# Patient Record
Sex: Female | Born: 1935 | Race: White | Hispanic: No | Marital: Married | Smoking: Never smoker
Health system: Southern US, Community
[De-identification: ages and names within clinical notes are randomized; demographics above are authoritative.]

## PROBLEM LIST (undated history)

## (undated) DIAGNOSIS — N83201 Unspecified ovarian cyst, right side: Secondary | ICD-10-CM

## (undated) DIAGNOSIS — N3946 Mixed incontinence: Secondary | ICD-10-CM

## (undated) DIAGNOSIS — D649 Anemia, unspecified: Secondary | ICD-10-CM

## (undated) DIAGNOSIS — I214 Non-ST elevation (NSTEMI) myocardial infarction: Secondary | ICD-10-CM

## (undated) DIAGNOSIS — N3281 Overactive bladder: Secondary | ICD-10-CM

## (undated) DIAGNOSIS — M8580 Other specified disorders of bone density and structure, unspecified site: Secondary | ICD-10-CM

## (undated) DIAGNOSIS — I10 Essential (primary) hypertension: Secondary | ICD-10-CM

## (undated) DIAGNOSIS — E785 Hyperlipidemia, unspecified: Secondary | ICD-10-CM

## (undated) DIAGNOSIS — F419 Anxiety disorder, unspecified: Secondary | ICD-10-CM

## (undated) DIAGNOSIS — C9000 Multiple myeloma not having achieved remission: Secondary | ICD-10-CM

## (undated) DIAGNOSIS — R17 Unspecified jaundice: Secondary | ICD-10-CM

## (undated) DIAGNOSIS — M1990 Unspecified osteoarthritis, unspecified site: Secondary | ICD-10-CM

## (undated) DIAGNOSIS — G2581 Restless legs syndrome: Secondary | ICD-10-CM

## (undated) DIAGNOSIS — E041 Nontoxic single thyroid nodule: Secondary | ICD-10-CM

## (undated) DIAGNOSIS — M5410 Radiculopathy, site unspecified: Secondary | ICD-10-CM

## (undated) DIAGNOSIS — R5382 Chronic fatigue, unspecified: Secondary | ICD-10-CM

## (undated) DIAGNOSIS — R2681 Unsteadiness on feet: Secondary | ICD-10-CM

## (undated) DIAGNOSIS — R918 Other nonspecific abnormal finding of lung field: Secondary | ICD-10-CM

## (undated) DIAGNOSIS — K589 Irritable bowel syndrome without diarrhea: Secondary | ICD-10-CM

## (undated) DIAGNOSIS — K219 Gastro-esophageal reflux disease without esophagitis: Secondary | ICD-10-CM

## (undated) DIAGNOSIS — N83202 Unspecified ovarian cyst, left side: Secondary | ICD-10-CM

## (undated) DIAGNOSIS — F329 Major depressive disorder, single episode, unspecified: Secondary | ICD-10-CM

## (undated) DIAGNOSIS — I7300 Raynaud's syndrome without gangrene: Secondary | ICD-10-CM

## (undated) DIAGNOSIS — I2510 Atherosclerotic heart disease of native coronary artery without angina pectoris: Secondary | ICD-10-CM

## (undated) DIAGNOSIS — Z951 Presence of aortocoronary bypass graft: Secondary | ICD-10-CM

## (undated) DIAGNOSIS — F0390 Unspecified dementia without behavioral disturbance: Secondary | ICD-10-CM

## (undated) DIAGNOSIS — M48061 Spinal stenosis, lumbar region without neurogenic claudication: Secondary | ICD-10-CM

## (undated) DIAGNOSIS — M940 Chondrocostal junction syndrome [Tietze]: Secondary | ICD-10-CM

## (undated) DIAGNOSIS — F410 Panic disorder [episodic paroxysmal anxiety] without agoraphobia: Secondary | ICD-10-CM

## (undated) DIAGNOSIS — R32 Unspecified urinary incontinence: Secondary | ICD-10-CM

## (undated) HISTORY — DX: Restless legs syndrome: G25.81

## (undated) HISTORY — DX: Atherosclerotic heart disease of native coronary artery without angina pectoris: I25.10

## (undated) HISTORY — DX: Hyperlipidemia, unspecified: E78.5

## (undated) HISTORY — DX: Chronic fatigue, unspecified: R53.82

## (undated) HISTORY — PX: CATARACT EXTRACTION, BILATERAL: SHX1313

## (undated) HISTORY — DX: Multiple myeloma not having achieved remission: C90.00

## (undated) HISTORY — DX: Spinal stenosis, lumbar region without neurogenic claudication: M48.061

## (undated) HISTORY — PX: DILATION AND CURETTAGE OF UTERUS: SHX78

## (undated) HISTORY — DX: Chondrocostal junction syndrome (tietze): M94.0

## (undated) HISTORY — DX: Unspecified dementia, unspecified severity, without behavioral disturbance, psychotic disturbance, mood disturbance, and anxiety: F03.90

## (undated) HISTORY — DX: Other nonspecific abnormal finding of lung field: R91.8

## (undated) HISTORY — PX: COLONOSCOPY: SHX174

## (undated) HISTORY — DX: Nontoxic single thyroid nodule: E04.1

## (undated) HISTORY — DX: Unsteadiness on feet: R26.81

## (undated) HISTORY — DX: Panic disorder (episodic paroxysmal anxiety): F41.0

## (undated) HISTORY — DX: Unspecified ovarian cyst, left side: N83.202

## (undated) HISTORY — DX: Raynaud's syndrome without gangrene: I73.00

## (undated) HISTORY — DX: Irritable bowel syndrome, unspecified: K58.9

## (undated) HISTORY — DX: Major depressive disorder, single episode, unspecified: F32.9

## (undated) HISTORY — DX: Radiculopathy, site unspecified: M54.10

## (undated) HISTORY — DX: Other specified disorders of bone density and structure, unspecified site: M85.80

## (undated) HISTORY — DX: Mixed incontinence: N39.46

## (undated) HISTORY — DX: Anemia, unspecified: D64.9

## (undated) HISTORY — PX: UPPER GASTROINTESTINAL ENDOSCOPY: SHX188

## (undated) HISTORY — DX: Unspecified ovarian cyst, right side: N83.201

## (undated) HISTORY — PX: TONSILLECTOMY AND ADENOIDECTOMY: SUR1326

## (undated) HISTORY — DX: Unspecified jaundice: R17

---

## 1998-05-16 ENCOUNTER — Other Ambulatory Visit: Admission: RE | Admit: 1998-05-16 | Discharge: 1998-05-16 | Payer: Self-pay | Admitting: Gastroenterology

## 1998-05-29 ENCOUNTER — Ambulatory Visit (HOSPITAL_COMMUNITY): Admission: RE | Admit: 1998-05-29 | Discharge: 1998-05-29 | Payer: Self-pay | Admitting: Gastroenterology

## 1999-04-29 ENCOUNTER — Other Ambulatory Visit: Admission: RE | Admit: 1999-04-29 | Discharge: 1999-04-29 | Payer: Self-pay | Admitting: Obstetrics and Gynecology

## 2000-03-16 ENCOUNTER — Encounter: Payer: Self-pay | Admitting: Obstetrics and Gynecology

## 2000-03-16 ENCOUNTER — Encounter: Admission: RE | Admit: 2000-03-16 | Discharge: 2000-03-16 | Payer: Self-pay | Admitting: Obstetrics and Gynecology

## 2000-04-20 ENCOUNTER — Other Ambulatory Visit: Admission: RE | Admit: 2000-04-20 | Discharge: 2000-04-20 | Payer: Self-pay | Admitting: Geriatric Medicine

## 2001-02-12 ENCOUNTER — Encounter (INDEPENDENT_AMBULATORY_CARE_PROVIDER_SITE_OTHER): Payer: Self-pay | Admitting: Specialist

## 2001-02-12 ENCOUNTER — Encounter: Payer: Self-pay | Admitting: Physician Assistant

## 2001-02-12 ENCOUNTER — Ambulatory Visit (HOSPITAL_COMMUNITY): Admission: RE | Admit: 2001-02-12 | Discharge: 2001-02-12 | Payer: Self-pay | Admitting: Gastroenterology

## 2001-02-14 ENCOUNTER — Encounter: Payer: Self-pay | Admitting: Physician Assistant

## 2001-04-28 ENCOUNTER — Other Ambulatory Visit: Admission: RE | Admit: 2001-04-28 | Discharge: 2001-04-28 | Payer: Self-pay | Admitting: Geriatric Medicine

## 2001-08-24 ENCOUNTER — Inpatient Hospital Stay (HOSPITAL_COMMUNITY): Admission: EM | Admit: 2001-08-24 | Discharge: 2001-08-26 | Payer: Self-pay | Admitting: Emergency Medicine

## 2001-08-24 ENCOUNTER — Encounter: Payer: Self-pay | Admitting: Emergency Medicine

## 2001-08-26 ENCOUNTER — Encounter: Payer: Self-pay | Admitting: Geriatric Medicine

## 2002-06-27 ENCOUNTER — Ambulatory Visit (HOSPITAL_COMMUNITY): Admission: RE | Admit: 2002-06-27 | Discharge: 2002-06-27 | Payer: Self-pay | Admitting: Specialist

## 2002-12-23 ENCOUNTER — Encounter: Payer: Self-pay | Admitting: Geriatric Medicine

## 2002-12-23 ENCOUNTER — Encounter: Admission: RE | Admit: 2002-12-23 | Discharge: 2002-12-23 | Payer: Self-pay | Admitting: Geriatric Medicine

## 2003-06-16 ENCOUNTER — Other Ambulatory Visit: Admission: RE | Admit: 2003-06-16 | Discharge: 2003-06-16 | Payer: Self-pay | Admitting: Geriatric Medicine

## 2004-12-04 ENCOUNTER — Encounter: Payer: Self-pay | Admitting: Physician Assistant

## 2005-01-27 ENCOUNTER — Other Ambulatory Visit: Admission: RE | Admit: 2005-01-27 | Discharge: 2005-01-27 | Payer: Self-pay | Admitting: Gynecology

## 2005-02-03 ENCOUNTER — Encounter: Payer: Self-pay | Admitting: Physician Assistant

## 2005-06-09 HISTORY — PX: CORONARY ARTERY BYPASS GRAFT: SHX141

## 2006-02-05 ENCOUNTER — Encounter (HOSPITAL_COMMUNITY): Admission: RE | Admit: 2006-02-05 | Discharge: 2006-05-06 | Payer: Self-pay | Admitting: Cardiovascular Disease

## 2006-02-08 ENCOUNTER — Observation Stay (HOSPITAL_COMMUNITY): Admission: EM | Admit: 2006-02-08 | Discharge: 2006-02-09 | Payer: Self-pay | Admitting: Emergency Medicine

## 2006-05-07 ENCOUNTER — Encounter (HOSPITAL_COMMUNITY): Admission: RE | Admit: 2006-05-07 | Discharge: 2006-06-18 | Payer: Self-pay | Admitting: Cardiovascular Disease

## 2006-05-13 ENCOUNTER — Other Ambulatory Visit: Admission: RE | Admit: 2006-05-13 | Discharge: 2006-05-13 | Payer: Self-pay | Admitting: Geriatric Medicine

## 2006-05-21 ENCOUNTER — Inpatient Hospital Stay (HOSPITAL_COMMUNITY): Admission: AD | Admit: 2006-05-21 | Discharge: 2006-06-07 | Payer: Self-pay | Admitting: Cardiovascular Disease

## 2006-05-26 ENCOUNTER — Encounter: Payer: Self-pay | Admitting: Vascular Surgery

## 2006-05-27 ENCOUNTER — Encounter: Payer: Self-pay | Admitting: Vascular Surgery

## 2006-06-13 ENCOUNTER — Emergency Department (HOSPITAL_COMMUNITY): Admission: EM | Admit: 2006-06-13 | Discharge: 2006-06-13 | Payer: Self-pay | Admitting: Emergency Medicine

## 2006-06-14 ENCOUNTER — Inpatient Hospital Stay (HOSPITAL_COMMUNITY): Admission: EM | Admit: 2006-06-14 | Discharge: 2006-06-16 | Payer: Self-pay | Admitting: Emergency Medicine

## 2006-06-25 ENCOUNTER — Encounter (HOSPITAL_COMMUNITY): Admission: RE | Admit: 2006-06-25 | Discharge: 2006-09-23 | Payer: Self-pay | Admitting: Cardiovascular Disease

## 2006-07-13 ENCOUNTER — Encounter: Admission: RE | Admit: 2006-07-13 | Discharge: 2006-07-13 | Payer: Self-pay | Admitting: Cardiothoracic Surgery

## 2006-07-30 ENCOUNTER — Ambulatory Visit: Payer: Self-pay | Admitting: Cardiothoracic Surgery

## 2006-08-26 ENCOUNTER — Emergency Department (HOSPITAL_COMMUNITY): Admission: EM | Admit: 2006-08-26 | Discharge: 2006-08-26 | Payer: Self-pay | Admitting: Emergency Medicine

## 2006-12-09 ENCOUNTER — Encounter: Admission: RE | Admit: 2006-12-09 | Discharge: 2006-12-09 | Payer: Self-pay | Admitting: Geriatric Medicine

## 2006-12-15 ENCOUNTER — Ambulatory Visit: Payer: Self-pay | Admitting: Oncology

## 2007-01-08 LAB — CBC WITH DIFFERENTIAL/PLATELET
Basophils Absolute: 0 10*3/uL (ref 0.0–0.1)
Eosinophils Absolute: 0.2 10*3/uL (ref 0.0–0.5)
HCT: 31.3 % — ABNORMAL LOW (ref 34.8–46.6)
HGB: 10.9 g/dL — ABNORMAL LOW (ref 11.6–15.9)
MCH: 32.6 pg (ref 26.0–34.0)
NEUT#: 1.4 10*3/uL — ABNORMAL LOW (ref 1.5–6.5)
NEUT%: 39 % — ABNORMAL LOW (ref 39.6–76.8)
RDW: 14.5 % (ref 11.3–14.5)
lymph#: 1.3 10*3/uL (ref 0.9–3.3)

## 2007-01-08 LAB — CHCC SMEAR

## 2007-01-12 LAB — COMPREHENSIVE METABOLIC PANEL
AST: 45 U/L — ABNORMAL HIGH (ref 0–37)
Albumin: 4.3 g/dL (ref 3.5–5.2)
BUN: 27 mg/dL — ABNORMAL HIGH (ref 6–23)
CO2: 28 mEq/L (ref 19–32)
Calcium: 9.6 mg/dL (ref 8.4–10.5)
Chloride: 97 mEq/L (ref 96–112)
Creatinine, Ser: 1.16 mg/dL (ref 0.40–1.20)
Glucose, Bld: 90 mg/dL (ref 70–99)
Potassium: 4.4 mEq/L (ref 3.5–5.3)

## 2007-01-12 LAB — PROTEIN ELECTROPHORESIS, SERUM
Albumin ELP: 52.2 % — ABNORMAL LOW (ref 55.8–66.1)
Alpha-1-Globulin: 3.7 % (ref 2.9–4.9)
Alpha-2-Globulin: 8.1 % (ref 7.1–11.8)
Gamma Globulin: 6.5 % — ABNORMAL LOW (ref 11.1–18.8)
Total Protein, Serum Electrophoresis: 8.2 g/dL (ref 6.0–8.3)

## 2007-01-12 LAB — BETA 2 MICROGLOBULIN, SERUM: Beta-2 Microglobulin: 2.43 mg/L — ABNORMAL HIGH (ref 1.01–1.73)

## 2007-01-12 LAB — KAPPA/LAMBDA LIGHT CHAINS
Kappa free light chain: 1.39 mg/dL (ref 0.33–1.94)
Kappa:Lambda Ratio: 0.03 — ABNORMAL LOW (ref 0.26–1.65)

## 2007-01-31 ENCOUNTER — Ambulatory Visit: Payer: Self-pay | Admitting: Oncology

## 2007-02-02 ENCOUNTER — Encounter: Payer: Self-pay | Admitting: Oncology

## 2007-02-02 ENCOUNTER — Other Ambulatory Visit: Admission: RE | Admit: 2007-02-02 | Discharge: 2007-02-02 | Payer: Self-pay | Admitting: Oncology

## 2007-04-06 ENCOUNTER — Ambulatory Visit: Payer: Self-pay | Admitting: Oncology

## 2007-04-08 LAB — CBC WITH DIFFERENTIAL/PLATELET
Basophils Absolute: 0 10*3/uL (ref 0.0–0.1)
EOS%: 4.8 % (ref 0.0–7.0)
HGB: 11.6 g/dL (ref 11.6–15.9)
MCH: 32.9 pg (ref 26.0–34.0)
MCHC: 34.9 g/dL (ref 32.0–36.0)
MCV: 94.1 fL (ref 81.0–101.0)
MONO%: 12.1 % (ref 0.0–13.0)
RDW: 14.3 % (ref 11.3–14.5)

## 2007-04-12 LAB — PROTEIN ELECTROPHORESIS, SERUM
Alpha-1-Globulin: 3.2 % (ref 2.9–4.9)
Alpha-2-Globulin: 8.4 % (ref 7.1–11.8)
Beta 2: 27.2 % — ABNORMAL HIGH (ref 3.2–6.5)
Gamma Globulin: 5.7 % — ABNORMAL LOW (ref 11.1–18.8)

## 2007-04-12 LAB — COMPREHENSIVE METABOLIC PANEL
AST: 31 U/L (ref 0–37)
Alkaline Phosphatase: 39 U/L (ref 39–117)
BUN: 22 mg/dL (ref 6–23)
Creatinine, Ser: 1.11 mg/dL (ref 0.40–1.20)
Potassium: 4.3 mEq/L (ref 3.5–5.3)

## 2007-04-12 LAB — KAPPA/LAMBDA LIGHT CHAINS: Kappa:Lambda Ratio: 0.02 — ABNORMAL LOW (ref 0.26–1.65)

## 2007-05-16 ENCOUNTER — Ambulatory Visit: Payer: Self-pay | Admitting: Cardiology

## 2007-05-16 ENCOUNTER — Inpatient Hospital Stay (HOSPITAL_COMMUNITY): Admission: EM | Admit: 2007-05-16 | Discharge: 2007-05-17 | Payer: Self-pay | Admitting: Emergency Medicine

## 2007-07-30 ENCOUNTER — Ambulatory Visit: Payer: Self-pay | Admitting: Oncology

## 2007-08-03 LAB — CBC WITH DIFFERENTIAL/PLATELET
BASO%: 0.5 % (ref 0.0–2.0)
EOS%: 3.5 % (ref 0.0–7.0)
HGB: 10.8 g/dL — ABNORMAL LOW (ref 11.6–15.9)
MCH: 32.7 pg (ref 26.0–34.0)
MCHC: 34.6 g/dL (ref 32.0–36.0)
RBC: 3.3 10*6/uL — ABNORMAL LOW (ref 3.70–5.32)
RDW: 13.9 % (ref 11.3–14.5)
lymph#: 1.3 10*3/uL (ref 0.9–3.3)

## 2007-08-05 LAB — COMPREHENSIVE METABOLIC PANEL
ALT: 15 U/L (ref 0–35)
AST: 25 U/L (ref 0–37)
Albumin: 4.1 g/dL (ref 3.5–5.2)
Calcium: 9.3 mg/dL (ref 8.4–10.5)
Chloride: 97 mEq/L (ref 96–112)
Potassium: 4.2 mEq/L (ref 3.5–5.3)
Sodium: 133 mEq/L — ABNORMAL LOW (ref 135–145)

## 2007-08-05 LAB — PROTEIN ELECTROPHORESIS, SERUM
Beta 2: 25.3 % — ABNORMAL HIGH (ref 3.2–6.5)
Gamma Globulin: 6.5 % — ABNORMAL LOW (ref 11.1–18.8)
M-Spike, %: 1.82 g/dL

## 2007-08-05 LAB — BETA 2 MICROGLOBULIN, SERUM: Beta-2 Microglobulin: 2.48 mg/L — ABNORMAL HIGH (ref 1.01–1.73)

## 2007-12-13 ENCOUNTER — Ambulatory Visit: Payer: Self-pay | Admitting: Oncology

## 2007-12-16 LAB — CBC WITH DIFFERENTIAL/PLATELET
Basophils Absolute: 0 10*3/uL (ref 0.0–0.1)
EOS%: 2.6 % (ref 0.0–7.0)
Eosinophils Absolute: 0.1 10*3/uL (ref 0.0–0.5)
HGB: 10.6 g/dL — ABNORMAL LOW (ref 11.6–15.9)
LYMPH%: 31.4 % (ref 14.0–48.0)
MCH: 33.1 pg (ref 26.0–34.0)
MCV: 93.9 fL (ref 81.0–101.0)
MONO%: 10.9 % (ref 0.0–13.0)
NEUT#: 2.5 10*3/uL (ref 1.5–6.5)
Platelets: 250 10*3/uL (ref 145–400)
RBC: 3.21 10*6/uL — ABNORMAL LOW (ref 3.70–5.32)
RDW: 13.6 % (ref 11.3–14.5)

## 2007-12-20 LAB — KAPPA/LAMBDA LIGHT CHAINS
Kappa:Lambda Ratio: 0.02 — ABNORMAL LOW (ref 0.26–1.65)
Lambda Free Lght Chn: 53.9 mg/dL — ABNORMAL HIGH (ref 0.57–2.63)

## 2007-12-20 LAB — PROTEIN ELECTROPHORESIS, SERUM
Albumin ELP: 52.4 % — ABNORMAL LOW (ref 55.8–66.1)
Alpha-1-Globulin: 3.4 % (ref 2.9–4.9)
Alpha-2-Globulin: 8.5 % (ref 7.1–11.8)
Beta 2: 25.1 % — ABNORMAL HIGH (ref 3.2–6.5)
Beta Globulin: 4.8 % (ref 4.7–7.2)
Gamma Globulin: 5.8 % — ABNORMAL LOW (ref 11.1–18.8)

## 2007-12-20 LAB — COMPREHENSIVE METABOLIC PANEL
AST: 31 U/L (ref 0–37)
Alkaline Phosphatase: 35 U/L — ABNORMAL LOW (ref 39–117)
BUN: 21 mg/dL (ref 6–23)
Glucose, Bld: 85 mg/dL (ref 70–99)
Potassium: 4.4 mEq/L (ref 3.5–5.3)
Total Bilirubin: 0.3 mg/dL (ref 0.3–1.2)

## 2008-02-07 ENCOUNTER — Encounter: Payer: Self-pay | Admitting: Physician Assistant

## 2008-04-05 ENCOUNTER — Encounter: Admission: RE | Admit: 2008-04-05 | Discharge: 2008-04-05 | Payer: Self-pay | Admitting: Geriatric Medicine

## 2008-04-21 ENCOUNTER — Ambulatory Visit: Payer: Self-pay | Admitting: Oncology

## 2008-04-25 LAB — CBC WITH DIFFERENTIAL/PLATELET
BASO%: 0.3 % (ref 0.0–2.0)
EOS%: 2.6 % (ref 0.0–7.0)
LYMPH%: 30 % (ref 14.0–48.0)
MCH: 32.9 pg (ref 26.0–34.0)
MCHC: 34.3 g/dL (ref 32.0–36.0)
MONO#: 0.5 10*3/uL (ref 0.1–0.9)
NEUT%: 58.4 % (ref 39.6–76.8)
Platelets: 239 10*3/uL (ref 145–400)
RBC: 3.26 10*6/uL — ABNORMAL LOW (ref 3.70–5.32)
WBC: 5.7 10*3/uL (ref 3.9–10.0)
lymph#: 1.7 10*3/uL (ref 0.9–3.3)

## 2008-04-27 LAB — PROTEIN ELECTROPHORESIS, SERUM
Alpha-2-Globulin: 9.3 % (ref 7.1–11.8)
Beta 2: 26.5 % — ABNORMAL HIGH (ref 3.2–6.5)
Beta Globulin: 4.7 % (ref 4.7–7.2)
Gamma Globulin: 5.6 % — ABNORMAL LOW (ref 11.1–18.8)
M-Spike, %: 1.77 g/dL
Total Protein, Serum Electrophoresis: 7.8 g/dL (ref 6.0–8.3)

## 2008-04-27 LAB — COMPREHENSIVE METABOLIC PANEL
ALT: 16 U/L (ref 0–35)
AST: 30 U/L (ref 0–37)
CO2: 25 mEq/L (ref 19–32)
Creatinine, Ser: 0.92 mg/dL (ref 0.40–1.20)
Sodium: 133 mEq/L — ABNORMAL LOW (ref 135–145)
Total Bilirubin: 0.4 mg/dL (ref 0.3–1.2)
Total Protein: 7.8 g/dL (ref 6.0–8.3)

## 2008-04-27 LAB — KAPPA/LAMBDA LIGHT CHAINS
Kappa free light chain: 1.32 mg/dL (ref 0.33–1.94)
Lambda Free Lght Chn: 32.8 mg/dL — ABNORMAL HIGH (ref 0.57–2.63)

## 2008-06-22 ENCOUNTER — Ambulatory Visit: Payer: Self-pay | Admitting: Sports Medicine

## 2008-06-22 DIAGNOSIS — M67919 Unspecified disorder of synovium and tendon, unspecified shoulder: Secondary | ICD-10-CM | POA: Insufficient documentation

## 2008-06-22 DIAGNOSIS — I7300 Raynaud's syndrome without gangrene: Secondary | ICD-10-CM

## 2008-06-22 DIAGNOSIS — I10 Essential (primary) hypertension: Secondary | ICD-10-CM

## 2008-06-22 DIAGNOSIS — E785 Hyperlipidemia, unspecified: Secondary | ICD-10-CM

## 2008-06-22 DIAGNOSIS — M719 Bursopathy, unspecified: Secondary | ICD-10-CM

## 2008-06-30 ENCOUNTER — Telehealth (INDEPENDENT_AMBULATORY_CARE_PROVIDER_SITE_OTHER): Payer: Self-pay | Admitting: *Deleted

## 2008-06-30 ENCOUNTER — Ambulatory Visit: Payer: Self-pay | Admitting: Sports Medicine

## 2008-06-30 DIAGNOSIS — M79609 Pain in unspecified limb: Secondary | ICD-10-CM

## 2008-07-01 ENCOUNTER — Encounter: Payer: Self-pay | Admitting: Sports Medicine

## 2008-07-03 ENCOUNTER — Telehealth (INDEPENDENT_AMBULATORY_CARE_PROVIDER_SITE_OTHER): Payer: Self-pay | Admitting: *Deleted

## 2008-07-03 ENCOUNTER — Ambulatory Visit: Payer: Self-pay | Admitting: Family Medicine

## 2008-07-12 ENCOUNTER — Encounter: Payer: Self-pay | Admitting: Family Medicine

## 2008-07-12 ENCOUNTER — Ambulatory Visit (HOSPITAL_COMMUNITY): Admission: RE | Admit: 2008-07-12 | Discharge: 2008-07-12 | Payer: Self-pay | Admitting: Family Medicine

## 2008-07-12 ENCOUNTER — Ambulatory Visit: Payer: Self-pay | Admitting: Surgery

## 2008-07-14 ENCOUNTER — Encounter: Payer: Self-pay | Admitting: Family Medicine

## 2008-07-14 ENCOUNTER — Telehealth: Payer: Self-pay | Admitting: Family Medicine

## 2008-07-28 ENCOUNTER — Ambulatory Visit: Payer: Self-pay | Admitting: Sports Medicine

## 2008-10-19 ENCOUNTER — Ambulatory Visit: Payer: Self-pay | Admitting: Oncology

## 2008-11-03 LAB — CBC WITH DIFFERENTIAL/PLATELET
Basophils Absolute: 0 10*3/uL (ref 0.0–0.1)
Eosinophils Absolute: 0.1 10*3/uL (ref 0.0–0.5)
HCT: 29.8 % — ABNORMAL LOW (ref 34.8–46.6)
HGB: 10.1 g/dL — ABNORMAL LOW (ref 11.6–15.9)
MCH: 31.1 pg (ref 25.1–34.0)
MONO#: 0.6 10*3/uL (ref 0.1–0.9)
NEUT#: 2.2 10*3/uL (ref 1.5–6.5)
NEUT%: 46.6 % (ref 38.4–76.8)
WBC: 4.7 10*3/uL (ref 3.9–10.3)
lymph#: 1.8 10*3/uL (ref 0.9–3.3)

## 2008-11-07 LAB — SODIUM, URINE, RANDOM: Sodium, Ur: 21 mEq/L

## 2008-11-08 LAB — KAPPA/LAMBDA LIGHT CHAINS
Kappa free light chain: 2.22 mg/dL — ABNORMAL HIGH (ref 0.33–1.94)
Kappa:Lambda Ratio: 0.07 — ABNORMAL LOW (ref 0.26–1.65)

## 2008-11-08 LAB — COMPREHENSIVE METABOLIC PANEL
Albumin: 4 g/dL (ref 3.5–5.2)
BUN: 21 mg/dL (ref 6–23)
CO2: 25 mEq/L (ref 19–32)
Calcium: 9.3 mg/dL (ref 8.4–10.5)
Chloride: 95 mEq/L — ABNORMAL LOW (ref 96–112)
Creatinine, Ser: 1.08 mg/dL (ref 0.40–1.20)
Glucose, Bld: 86 mg/dL (ref 70–99)

## 2008-11-08 LAB — PROTEIN ELECTROPHORESIS, SERUM
Albumin ELP: 53 % — ABNORMAL LOW (ref 55.8–66.1)
Alpha-1-Globulin: 3.5 % (ref 2.9–4.9)

## 2008-11-08 LAB — BETA 2 MICROGLOBULIN, SERUM: Beta-2 Microglobulin: 2.87 mg/L — ABNORMAL HIGH (ref 1.01–1.73)

## 2008-11-10 ENCOUNTER — Ambulatory Visit: Payer: Self-pay | Admitting: Sports Medicine

## 2008-11-10 DIAGNOSIS — M47817 Spondylosis without myelopathy or radiculopathy, lumbosacral region: Secondary | ICD-10-CM | POA: Insufficient documentation

## 2009-02-28 ENCOUNTER — Ambulatory Visit: Payer: Self-pay | Admitting: Oncology

## 2009-03-05 LAB — CBC WITH DIFFERENTIAL/PLATELET
BASO%: 0.4 % (ref 0.0–2.0)
EOS%: 2.6 % (ref 0.0–7.0)
MCH: 33.1 pg (ref 25.1–34.0)
MCHC: 34.7 g/dL (ref 31.5–36.0)
RDW: 14.7 % — ABNORMAL HIGH (ref 11.2–14.5)
lymph#: 1.3 10*3/uL (ref 0.9–3.3)

## 2009-03-07 LAB — COMPREHENSIVE METABOLIC PANEL
ALT: 15 U/L (ref 0–35)
AST: 26 U/L (ref 0–37)
Albumin: 4.2 g/dL (ref 3.5–5.2)
Calcium: 9.2 mg/dL (ref 8.4–10.5)
Chloride: 100 mEq/L (ref 96–112)
Creatinine, Ser: 0.96 mg/dL (ref 0.40–1.20)
Potassium: 4 mEq/L (ref 3.5–5.3)

## 2009-03-07 LAB — KAPPA/LAMBDA LIGHT CHAINS: Kappa:Lambda Ratio: 0.04 — ABNORMAL LOW (ref 0.26–1.65)

## 2009-03-07 LAB — PROTEIN ELECTROPHORESIS, SERUM
Albumin ELP: 49.5 % — ABNORMAL LOW (ref 55.8–66.1)
Alpha-1-Globulin: 3.8 % (ref 2.9–4.9)
Alpha-2-Globulin: 9.4 % (ref 7.1–11.8)
Gamma Globulin: 7.1 % — ABNORMAL LOW (ref 11.1–18.8)

## 2009-07-16 ENCOUNTER — Other Ambulatory Visit: Admission: RE | Admit: 2009-07-16 | Discharge: 2009-07-16 | Payer: Self-pay | Admitting: Geriatric Medicine

## 2009-08-06 ENCOUNTER — Encounter: Payer: Self-pay | Admitting: Sports Medicine

## 2009-08-13 ENCOUNTER — Ambulatory Visit: Payer: Self-pay | Admitting: Sports Medicine

## 2009-08-13 DIAGNOSIS — M25559 Pain in unspecified hip: Secondary | ICD-10-CM

## 2009-08-13 DIAGNOSIS — M76899 Other specified enthesopathies of unspecified lower limb, excluding foot: Secondary | ICD-10-CM | POA: Insufficient documentation

## 2009-08-15 ENCOUNTER — Inpatient Hospital Stay (HOSPITAL_COMMUNITY): Admission: EM | Admit: 2009-08-15 | Discharge: 2009-08-17 | Payer: Self-pay | Admitting: Interventional Cardiology

## 2009-08-15 ENCOUNTER — Ambulatory Visit: Payer: Self-pay | Admitting: Diagnostic Radiology

## 2009-08-15 ENCOUNTER — Ambulatory Visit: Payer: Self-pay | Admitting: Internal Medicine

## 2009-08-15 ENCOUNTER — Encounter: Payer: Self-pay | Admitting: Emergency Medicine

## 2009-08-30 ENCOUNTER — Ambulatory Visit: Payer: Self-pay | Admitting: Oncology

## 2009-09-03 LAB — CBC WITH DIFFERENTIAL/PLATELET
Basophils Absolute: 0 10*3/uL (ref 0.0–0.1)
HCT: 33 % — ABNORMAL LOW (ref 34.8–46.6)
HGB: 11.2 g/dL — ABNORMAL LOW (ref 11.6–15.9)
LYMPH%: 19.2 % (ref 14.0–49.7)
MCH: 32.8 pg (ref 25.1–34.0)
MONO#: 0.5 10*3/uL (ref 0.1–0.9)
NEUT%: 70.5 % (ref 38.4–76.8)
Platelets: 253 10*3/uL (ref 145–400)
WBC: 5.7 10*3/uL (ref 3.9–10.3)
lymph#: 1.1 10*3/uL (ref 0.9–3.3)

## 2009-09-05 LAB — COMPREHENSIVE METABOLIC PANEL
BUN: 19 mg/dL (ref 6–23)
Creatinine, Ser: 1.07 mg/dL (ref 0.40–1.20)
Potassium: 4.2 mEq/L (ref 3.5–5.3)
Total Protein: 8 g/dL (ref 6.0–8.3)

## 2009-09-05 LAB — PROTEIN ELECTROPHORESIS, SERUM
Alpha-1-Globulin: 4.6 % (ref 2.9–4.9)
Alpha-2-Globulin: 8.7 % (ref 7.1–11.8)
Beta 2: 23.8 % — ABNORMAL HIGH (ref 3.2–6.5)
Beta Globulin: 5.6 % (ref 4.7–7.2)
Gamma Globulin: 6.1 % — ABNORMAL LOW (ref 11.1–18.8)

## 2009-09-05 LAB — KAPPA/LAMBDA LIGHT CHAINS: Lambda Free Lght Chn: 33.1 mg/dL — ABNORMAL HIGH (ref 0.57–2.63)

## 2009-09-11 ENCOUNTER — Ambulatory Visit: Payer: Self-pay | Admitting: Diagnostic Radiology

## 2009-09-11 ENCOUNTER — Observation Stay (HOSPITAL_COMMUNITY): Admission: RE | Admit: 2009-09-11 | Discharge: 2009-09-13 | Payer: Self-pay | Admitting: Interventional Cardiology

## 2009-09-11 ENCOUNTER — Encounter: Payer: Self-pay | Admitting: Emergency Medicine

## 2009-09-19 ENCOUNTER — Encounter: Admission: RE | Admit: 2009-09-19 | Discharge: 2009-09-19 | Payer: Self-pay | Admitting: Geriatric Medicine

## 2009-10-11 ENCOUNTER — Ambulatory Visit: Payer: Self-pay | Admitting: Sports Medicine

## 2009-10-11 DIAGNOSIS — R0789 Other chest pain: Secondary | ICD-10-CM

## 2009-10-11 DIAGNOSIS — I25810 Atherosclerosis of coronary artery bypass graft(s) without angina pectoris: Secondary | ICD-10-CM

## 2010-03-12 ENCOUNTER — Observation Stay (HOSPITAL_COMMUNITY): Admission: EM | Admit: 2010-03-12 | Discharge: 2010-03-12 | Payer: Self-pay | Admitting: Interventional Cardiology

## 2010-03-12 ENCOUNTER — Ambulatory Visit: Payer: Self-pay | Admitting: Diagnostic Radiology

## 2010-03-12 ENCOUNTER — Ambulatory Visit: Payer: Self-pay | Admitting: Internal Medicine

## 2010-03-12 ENCOUNTER — Encounter: Payer: Self-pay | Admitting: Emergency Medicine

## 2010-05-13 ENCOUNTER — Ambulatory Visit: Payer: Self-pay | Admitting: Oncology

## 2010-05-14 LAB — CBC WITH DIFFERENTIAL/PLATELET
Basophils Absolute: 0 10*3/uL (ref 0.0–0.1)
EOS%: 2.6 % (ref 0.0–7.0)
MCH: 33.3 pg (ref 25.1–34.0)
MCV: 96.1 fL (ref 79.5–101.0)
MONO%: 10.7 % (ref 0.0–14.0)
RBC: 3.35 10*6/uL — ABNORMAL LOW (ref 3.70–5.45)
RDW: 14.5 % (ref 11.2–14.5)

## 2010-05-16 LAB — PROTEIN ELECTROPHORESIS, SERUM
Alpha-2-Globulin: 8.6 % (ref 7.1–11.8)
Beta 2: 26.7 % — ABNORMAL HIGH (ref 3.2–6.5)
Beta Globulin: 4.5 % — ABNORMAL LOW (ref 4.7–7.2)
M-Spike, %: 1.78 g/dL

## 2010-05-16 LAB — COMPREHENSIVE METABOLIC PANEL
ALT: 23 U/L (ref 0–35)
AST: 34 U/L (ref 0–37)
Alkaline Phosphatase: 36 U/L — ABNORMAL LOW (ref 39–117)
CO2: 27 mEq/L (ref 19–32)
Creatinine, Ser: 1.02 mg/dL (ref 0.40–1.20)
Sodium: 138 mEq/L (ref 135–145)
Total Bilirubin: 0.4 mg/dL (ref 0.3–1.2)
Total Protein: 7.4 g/dL (ref 6.0–8.3)

## 2010-06-27 ENCOUNTER — Other Ambulatory Visit: Payer: Self-pay | Admitting: Oncology

## 2010-06-27 DIAGNOSIS — C9000 Multiple myeloma not having achieved remission: Secondary | ICD-10-CM

## 2010-07-09 NOTE — Assessment & Plan Note (Signed)
Summary: F/U,MC   Vital Signs:  Patient profile:   77 year old female BP sitting:   119 / 58  Vitals Entered By: Lillia Pauls CMA (Oct 11, 2009 1:50 PM)  History of Present Illness: Patient enters w persistent Rib pain on left side that radiates directly under left breast She has known CAD Has twice been hospitialized since last seeing me CP on 1 was associated w bump in enzymes and ? of ACS  However this pain is persistent has no response to NTG little response to medicines in general hurts w movement hurts a lot with trying to get up from a lying position or lying down  not asssic w cough or SOB  Allergies: 1)  ! Penicillin 2)  ! Ibuprofen  Physical Exam  General:  Well-developed,well-nourished,in no acute distress; alert,appropriate and cooperative throughout examination Chest Wall:  Tenderness along 5th Rib to direct compression note that compression of rib cage did not provoke pain no irregularity felt Breasts:  No mass, nodules, thickening, tenderness, bulging, retraction, inflamation, nipple discharge or skin changes noted.   Lungs:  Normal respiratory effort, chest expands symmetrically. Lungs are clear to auscultation, no crackles or wheezes. Heart:  midline scar  normal rate, regular rhythm, no murmur, no gallop, and no rub.   Additional Exam:  MSK [name]  This showed an increase in doppler activity over rib 5 but not 4 or 6 There is no cortical irregularity There is borderline if any periosteal hypoechoic change  images saved   Impression & Recommendations:  Problem # 1:  CHEST PAIN, NON-CARDIAC (ICD-786.59)  I do not think this is cardiac  use of a padded splint w ace wrap and felt relieved her pain on sitting and lying  not enough diagnostic findings on [name] to confirm rib stress fx but this is certainly possible vs chondral separation at rib margin  use splinting x 3 weeks and I will reck  note to Dr Pete Glatter  Orders: [name] LIMITED  [phone])  Problem # 2:  CORONARY ATHEROSCLEROSIS, ARTERY BYPASS GRAFT (ICD-414.04) With known CAD and old CABG she is struggling to be sure chest pain not cardiac  Advised to seek f/u if pain pattern changes  Her updated medication list for this problem includes:    Metoprolol Tartrate 25 Mg Tabs (Metoprolol tartrate)    Aspirin 81 Mg Tbec (Aspirin) ..... One by mouth every day  Problem # 3:  GREATER TROCHANTERIC BURSITIS (ICD-726.5) This is generally improved since last visit  overall I asked her to reduce her exercise to very easy and avoid upper extremity until rib area pain resolves  Complete Medication List: 1)  Zegerid 20-1100 Mg Caps (Omeprazole-sodium bicarbonate) 2)  Librax 2.5-5 Mg Caps (Clidinium-chlordiazepoxide) .... Take 1 tab qd 3)  Metoprolol Tartrate 25 Mg Tabs (Metoprolol tartrate) 4)  Sertraline Hcl 50 Mg Tabs (Sertraline hcl) .Marland Kitchen.. 1 by mouth daily 5)  Aspirin 81 Mg Tbec (Aspirin) .... One by mouth every day 6)  Caltrate 600 1500 Mg Tabs (Calcium carbonate)

## 2010-07-09 NOTE — Letter (Signed)
Summary: [facility] @ [facility] @ [place]   Imported By: Marily Memos 08/13/2009 10:04:10  _____________________________________________________________________  External Attachment:    Type:   Image     Comment:   External Document

## 2010-07-09 NOTE — Assessment & Plan Note (Signed)
Summary: R HIP PAIN,MC   Vital Signs:  Patient profile:   77 year old female Height:      63 inches Weight:      114 pounds BMI:     20.27 BP sitting:   131 / 67  Vitals Entered By: Lillia Pauls CMA (August 13, 2009 9:53 AM)  History of Present Illness: 6 weeks ago did Zumba class also doing yoga class hip hurt after yoga has hx of cardiac disease and trying to stay fit c/o RT hip pain over great troch area more walking brings out pain but first wakes up - not too bad did wake her up at nite on 1 occassion  now living at [facility] and has good fitness people  Dr Pete Glatter felt she may have greater troch bursitis and sent her for evaluation  Allergies: 1)  ! Penicillin 2)  ! Ibuprofen  Physical Exam  General:  Well-developed,well-nourished,in no acute distress; alert,appropriate and cooperative throughout examination Msk:  Pain to palp over RT greater troch Hip ROM is good 70 RT and 75 left with 30 IR on both sides abduction is strong but causes pain resisted hip rotation causes mild pain adduction was OK hip flexion was very good   Impression & Recommendations:  Problem # 1:  HIP PAIN, RIGHT (ICD-719.45)  Her updated medication list for this problem includes:    Aspirin 81 Mg Tbec (Aspirin) ..... One by mouth every day  This does not seem to be related to the joint suspect triggered in exercise classes  Orders: Trigger Point Injection (1 or 2 muscles) ([address])  Problem # 2:  GREATER TROCHANTERIC BURSITIS (ICD-726.5)  prep  cleanse with alcohol Topical analgesic spray : Ethyl choride Joint RT Hip at grater tochanter Approached in typical fashion with:palpation and lat post approach just below trochanter to the area of max tenderness Completed without difficulty Meds: 1cc kenalog 40 + 4 cc lidocaine Needle: 25 g and 1.5 in Aftercare instructions and Red flags advised   wait 48 hrs begin easy rexercise for hip given instructions on these  reck 6  wks  Orders: Trigger Point Injection (1 or 2 muscles) ([address])  Complete Medication List: 1)  Zegerid 20-1100 Mg Caps (Omeprazole-sodium bicarbonate) 2)  Librax 2.5-5 Mg Caps (Clidinium-chlordiazepoxide) .... Take 1 tab qd 3)  Metoprolol Tartrate 25 Mg Tabs (Metoprolol tartrate) 4)  Sertraline Hcl 50 Mg Tabs (Sertraline hcl) .Marland Kitchen.. 1 by mouth daily 5)  Aspirin 81 Mg Tbec (Aspirin) .... One by mouth every day 6)  Caltrate 600 1500 Mg Tabs (Calcium carbonate)  Patient Instructions: 1)  Try some heat before activity to RT hip 2)  Ok to ride the stationary bike 3)  careful with any step work until this is better 4)  walking is OK 5)  water walking not good at this point 6)  exercises as on sheet 7)  first 2 days after injection hip may ache - use ice if it does 8)  OK to try seated yoga class 9)  ice to this hip at end of day for 5 to 10 mins 10)  reck 6 weeks

## 2010-07-30 ENCOUNTER — Ambulatory Visit (HOSPITAL_COMMUNITY)
Admission: RE | Admit: 2010-07-30 | Discharge: 2010-07-30 | Disposition: A | Discharge status: Home / Self Care | Payer: MEDICARE | Source: Ambulatory Visit | Attending: Oncology | Admitting: Oncology

## 2010-07-30 DIAGNOSIS — M25519 Pain in unspecified shoulder: Secondary | ICD-10-CM | POA: Insufficient documentation

## 2010-07-30 DIAGNOSIS — M479 Spondylosis, unspecified: Secondary | ICD-10-CM | POA: Insufficient documentation

## 2010-07-30 DIAGNOSIS — C9000 Multiple myeloma not having achieved remission: Secondary | ICD-10-CM

## 2010-07-30 DIAGNOSIS — Z87898 Personal history of other specified conditions: Secondary | ICD-10-CM | POA: Insufficient documentation

## 2010-07-30 DIAGNOSIS — M25569 Pain in unspecified knee: Secondary | ICD-10-CM | POA: Insufficient documentation

## 2010-08-15 ENCOUNTER — Encounter: Payer: Self-pay | Admitting: Family Medicine

## 2010-08-15 ENCOUNTER — Encounter: Payer: Self-pay | Admitting: Sports Medicine

## 2010-08-15 ENCOUNTER — Ambulatory Visit (INDEPENDENT_AMBULATORY_CARE_PROVIDER_SITE_OTHER): Payer: MEDICARE | Admitting: Sports Medicine

## 2010-08-15 DIAGNOSIS — M25519 Pain in unspecified shoulder: Secondary | ICD-10-CM | POA: Insufficient documentation

## 2010-08-15 DIAGNOSIS — W19XXXA Unspecified fall, initial encounter: Secondary | ICD-10-CM

## 2010-08-15 DIAGNOSIS — R269 Unspecified abnormalities of gait and mobility: Secondary | ICD-10-CM | POA: Insufficient documentation

## 2010-08-15 DIAGNOSIS — M25569 Pain in unspecified knee: Secondary | ICD-10-CM

## 2010-08-20 NOTE — Letter (Signed)
Summary: Generic Letter  [facility]  [address]   [place], [place] [address]   Phone: [phone]  Fax: [phone]    08/15/2010  RENDA POHLMAN [address] [place], [place]  [address]  [place]     To Physical Therapist/Physician:   Mrs. Wescott was recently evaluate for recurrent falls in our office. She continues to have difficulty with her gait which is secondary to deconditioning and skeletal anomalies (kyphoscoliosis). After evaluation she has not sustained any major injuries such as fracture or torn ligaments. She is stable for rehabilitation and physical therapy.  We request evaluation and treatment to strengthen Hip Abductors,Hip Flexors and Hip Rotation. We also request therapy to improve her shoulder range of motion and gait training.        Sincerely,   Milinda Antis MD

## 2010-08-20 NOTE — Assessment & Plan Note (Signed)
Summary: SHOULDERS/ARM INJURY/BRUISED KNEE DUE TO A FALL,MC   Vital Signs:  Patient profile:   77 year old female Pulse rate:   57 / minute BP sitting:   112 / 62  (left arm)  Vitals Entered By: Rochele Pages RN (August 15, 2010 1:45 PM)  CC:  Shoulder pain/Knee pain.  History of Present Illness:  Pt presents after falling twice in the past month Was on a cruise after getting married and tripped over the curb falling to knees and face +brusing and mild swelling of bilat knees, felt soreness in both shoulders like she aggravated her bursitis and had brusing on her face. She used tylenol for pain and gradually the knees improved  This past week while at her doctors office tripped in her shoes and fell again, knees his the ground and her shoulder felt jarred again, last night shoulder pain was so severe she was crying and unable to sleep  Denies knees locking or giving out , +popping sensation in knees  Current Medications (verified): 1)  Zegerid 20-1100 Mg Caps (Omeprazole-Sodium Bicarbonate) 2)  Librax 2.5-5 Mg  Caps (Clidinium-Chlordiazepoxide) .... Take 1 Tab Qd 3)  Metoprolol Tartrate 25 Mg Tabs (Metoprolol Tartrate) 4)  Sertraline Hcl 50 Mg Tabs (Sertraline Hcl) .Marland Kitchen.. 1 By Mouth Daily 5)  Aspirin 81 Mg  Tbec (Aspirin) .... One By Mouth Every Day 6)  Caltrate 600 1500 Mg Tabs (Calcium Carbonate)  Allergies (verified): 1)  ! Penicillin 2)  ! Ibuprofen  Physical Exam  General:  Well-developed,well-nourished,in no acute distress; alert,appropriate and cooperative throughout examination Vital signs noted  Msk:  Bilateral shoulder- Rotator cuff in tact, pain with external rotation, elevation to 140degrees before discomoft, no drop arm sign, non tender over bicipital groove, mild discomfort right arm with empty can and speeds able to cross arm without discomfort Strength 4/5 but equal bilaterally in upper ext  Lower ext-  Knee- no effusion noted, minimal bruising over right  patella, healed laceration, anterior knee pain-mild no pain with extension at knee Weak hip abductors Hip flexors adequate no pain with external or internal rotation  Back- kyphoscoliosis Gait- leans to right with walking, narrow based gait Sit to stand without difficulty   Impression & Recommendations:  Problem # 1:  SHOULDER PAIN, BILATERAL (ICD-719.41) Assessment Deteriorated   Chronic problems for patient worsened by recent injuries, no evidence of rotator cuff tear or strain but she is overall weak and lacks ROM she has lost over time with episodes of bursitis, will plan to have PT at home ALF work with her Her updated medication list for this problem includes:    Aspirin 81 Mg Tbec (Aspirin) ..... One by mouth every day  Problem # 2:  KNEE PAIN, BILATERAL (ICD-719.46) Assessment: Deteriorated   Fairly normal exam in lieu of recent falls- expected soreness and bruisng over knees, work on strengthing of lower extremeties  see instructions Her updated medication list for this problem includes:    Aspirin 81 Mg Tbec (Aspirin) ..... One by mouth every day  Problem # 3:  ACCIDENTAL FALLS, RECURRENT (ICD-E888.9) Secondary to her gait instabilty as noticed during exam Plan for PT for gait stability   Milinda Antis MD PGY-3  Problem # 4:  ABNORMALITY OF GAIT (ICD-781.2)  Complete Medication List: 1)  Zegerid 20-1100 Mg Caps (Omeprazole-sodium bicarbonate) 2)  Librax 2.5-5 Mg Caps (Clidinium-chlordiazepoxide) .... Take 1 tab qd 3)  Metoprolol Tartrate 25 Mg Tabs (Metoprolol tartrate) 4)  Sertraline Hcl 50 Mg Tabs (  Sertraline hcl) .Marland Kitchen.. 1 by mouth daily 5)  Aspirin 81 Mg Tbec (Aspirin) .... One by mouth every day 6)  Caltrate 600 1500 Mg Tabs (Calcium carbonate)  Patient Instructions: 1)  Do seated hip abductors- 10 on each leg twice a day 2)  Do seated- external rotation movements 10 on each leg 3)  While sitting in a chair- bring up your knee while bent (Hip Flexors) 10  times on each leg- twice a day 4)  For your shoulder - see the instructions - do the wall crawls while leaning down on a table, the shoulder swing back and forth 5)  Please give letter to your therapist    Orders Added: 1)  Est. Patient Level IV [[address]]

## 2010-08-22 LAB — BASIC METABOLIC PANEL WITH GFR
BUN: 18 mg/dL (ref 6–23)
CO2: 30 meq/L (ref 19–32)
Calcium: 9.5 mg/dL (ref 8.4–10.5)
Chloride: 99 meq/L (ref 96–112)
Creatinine, Ser: 0.8 mg/dL (ref 0.4–1.2)
GFR calc non Af Amer: >60 mL/min
Glucose, Bld: 98 mg/dL (ref 70–99)
Potassium: 3.7 meq/L (ref 3.5–5.1)
Sodium: 139 meq/L (ref 135–145)

## 2010-08-22 LAB — DIFFERENTIAL
Basophils Absolute: 0.1 K/uL (ref 0.0–0.1)
Basophils Relative: 1 % (ref 0–1)
Eosinophils Absolute: 0.2 K/uL (ref 0.0–0.7)
Eosinophils Relative: 5 % (ref 0–5)
Lymphocytes Relative: 35 % (ref 12–46)
Lymphs Abs: 1.5 K/uL (ref 0.7–4.0)
Monocytes Absolute: 0.5 K/uL (ref 0.1–1.0)
Monocytes Relative: 12 % (ref 3–12)
Neutro Abs: 1.9 K/uL (ref 1.7–7.7)
Neutrophils Relative %: 47 % (ref 43–77)

## 2010-08-22 LAB — CBC
HCT: 33 % — ABNORMAL LOW (ref 36.0–46.0)
Hemoglobin: 11.1 g/dL — ABNORMAL LOW (ref 12.0–15.0)
MCH: 31.8 pg (ref 26.0–34.0)
MCHC: 33.5 g/dL (ref 30.0–36.0)
MCV: 94.8 fL (ref 78.0–100.0)
Platelets: 213 K/uL (ref 150–400)
RBC: 3.48 MIL/uL — ABNORMAL LOW (ref 3.87–5.11)
RDW: 13.2 % (ref 11.5–15.5)
WBC: 4.2 K/uL (ref 4.0–10.5)

## 2010-08-22 LAB — LIPID PANEL
HDL: 66 mg/dL (ref >39)
Total CHOL/HDL Ratio: 2 RATIO
Triglycerides: 37 mg/dL (ref <150)
VLDL: 7 mg/dL (ref 0–40)

## 2010-08-22 LAB — CARDIAC PANEL(CRET KIN+CKTOT+MB+TROPI)
CK, MB: 2.9 ng/mL (ref 0.3–4.0)
Relative Index: 2 (ref 0.0–2.5)
Total CK: 142 U/L (ref 7–177)

## 2010-08-22 LAB — POCT CARDIAC MARKERS
CKMB, poc: <1 ng/mL — ABNORMAL LOW (ref 1.0–8.0)
Myoglobin, poc: 60 ng/mL (ref 12–200)
Troponin i, poc: <0.05 ng/mL (ref 0.00–0.09)

## 2010-08-28 LAB — CBC
Hemoglobin: 10.3 g/dL — ABNORMAL LOW (ref 12.0–15.0)
MCHC: 34.1 g/dL (ref 30.0–36.0)
RBC: 3.13 MIL/uL — ABNORMAL LOW (ref 3.87–5.11)
WBC: 4.1 10*3/uL (ref 4.0–10.5)

## 2010-08-28 LAB — CARDIAC PANEL(CRET KIN+CKTOT+MB+TROPI)
CK, MB: 1.5 ng/mL (ref 0.3–4.0)
CK, MB: 1.7 ng/mL (ref 0.3–4.0)
CK, MB: 2.1 ng/mL (ref 0.3–4.0)
Relative Index: INVALID (ref 0.0–2.5)
Relative Index: INVALID (ref 0.0–2.5)
Total CK: 109 U/L (ref 7–177)
Troponin I: 0.01 ng/mL (ref 0.00–0.06)
Troponin I: 0.01 ng/mL (ref 0.00–0.06)

## 2010-08-28 LAB — BASIC METABOLIC PANEL
CO2: 28 mEq/L (ref 19–32)
Calcium: 9.4 mg/dL (ref 8.4–10.5)
Creatinine, Ser: 0.9 mg/dL (ref 0.4–1.2)
GFR calc Af Amer: >60 mL/min (ref >60)
GFR calc non Af Amer: >60 mL/min (ref >60)
Sodium: 133 mEq/L — ABNORMAL LOW (ref 135–145)

## 2010-08-28 LAB — POCT CARDIAC MARKERS
CKMB, poc: <1 ng/mL — ABNORMAL LOW (ref 1.0–8.0)
Myoglobin, poc: 43.2 ng/mL (ref 12–200)
Troponin i, poc: <0.05 ng/mL (ref 0.00–0.09)

## 2010-08-28 LAB — DIFFERENTIAL
Basophils Relative: 1 % (ref 0–1)
Lymphocytes Relative: 27 % (ref 12–46)
Monocytes Absolute: 0.5 10*3/uL (ref 0.1–1.0)
Monocytes Relative: 12 % (ref 3–12)
Neutro Abs: 2.4 10*3/uL (ref 1.7–7.7)
Neutrophils Relative %: 57 % (ref 43–77)

## 2010-09-02 LAB — BASIC METABOLIC PANEL
BUN: 13 mg/dL (ref 6–23)
CO2: 30 mEq/L (ref 19–32)
Chloride: 101 mEq/L (ref 96–112)
Creatinine, Ser: 0.89 mg/dL (ref 0.4–1.2)
GFR calc Af Amer: >60 mL/min (ref >60)
GFR calc non Af Amer: >60 mL/min (ref >60)
Potassium: 3.8 mEq/L (ref 3.5–5.1)
Potassium: 3.6 mEq/L (ref 3.5–5.1)

## 2010-09-02 LAB — CBC
HCT: 31 % — ABNORMAL LOW (ref 36.0–46.0)
HCT: 31.3 % — ABNORMAL LOW (ref 36.0–46.0)
Hemoglobin: 10.8 g/dL — ABNORMAL LOW (ref 12.0–15.0)
MCHC: 35 g/dL (ref 30.0–36.0)
MCV: 94.4 fL (ref 78.0–100.0)
Platelets: 220 10*3/uL (ref 150–400)
RBC: 3.28 MIL/uL — ABNORMAL LOW (ref 3.87–5.11)
WBC: 4.7 10*3/uL (ref 4.0–10.5)
WBC: 5.5 10*3/uL (ref 4.0–10.5)

## 2010-09-02 LAB — POCT CARDIAC MARKERS
CKMB, poc: 2.1 ng/mL (ref 1.0–8.0)
Myoglobin, poc: 116 ng/mL (ref 12–200)
Myoglobin, poc: 74.3 ng/mL (ref 12–200)
Troponin i, poc: 0.49 ng/mL (ref 0.00–0.09)

## 2010-09-02 LAB — DIFFERENTIAL
Eosinophils Absolute: 0.1 10*3/uL (ref 0.0–0.7)
Eosinophils Relative: 2 % (ref 0–5)
Lymphs Abs: 1.3 10*3/uL (ref 0.7–4.0)
Monocytes Absolute: 0.4 10*3/uL (ref 0.1–1.0)
Monocytes Relative: 9 % (ref 3–12)

## 2010-09-02 LAB — LIPID PANEL
Cholesterol: 158 mg/dL (ref 0–200)
Triglycerides: 28 mg/dL (ref <150)

## 2010-09-02 LAB — PROTIME-INR: INR: 1.08 (ref 0.00–1.49)

## 2010-09-02 LAB — CARDIAC PANEL(CRET KIN+CKTOT+MB+TROPI)
CK, MB: 6.8 ng/mL (ref 0.3–4.0)
Relative Index: 4.3 — ABNORMAL HIGH (ref 0.0–2.5)
Troponin I: 0.8 ng/mL (ref 0.00–0.06)

## 2010-09-02 LAB — HEPARIN LEVEL (UNFRACTIONATED)
Heparin Unfractionated: 0.8 IU/mL — ABNORMAL HIGH (ref 0.30–0.70)
Heparin Unfractionated: <0.1 IU/mL — ABNORMAL LOW (ref 0.30–0.70)

## 2010-09-02 LAB — APTT: aPTT: 124 seconds — ABNORMAL HIGH (ref 24–37)

## 2010-09-02 LAB — MRSA PCR SCREENING: MRSA by PCR: NEGATIVE

## 2010-09-30 ENCOUNTER — Emergency Department (HOSPITAL_COMMUNITY): Payer: Medicare Other

## 2010-09-30 ENCOUNTER — Inpatient Hospital Stay (HOSPITAL_COMMUNITY)
Admission: EM | Admit: 2010-09-30 | Discharge: 2010-10-02 | DRG: 313 | Disposition: A | Discharge status: Home / Self Care | Payer: Medicare Other | Attending: Interventional Cardiology | Admitting: Interventional Cardiology

## 2010-09-30 DIAGNOSIS — Z9849 Cataract extraction status, unspecified eye: Secondary | ICD-10-CM

## 2010-09-30 DIAGNOSIS — I7300 Raynaud's syndrome without gangrene: Secondary | ICD-10-CM | POA: Diagnosis present

## 2010-09-30 DIAGNOSIS — F411 Generalized anxiety disorder: Secondary | ICD-10-CM | POA: Diagnosis present

## 2010-09-30 DIAGNOSIS — Z951 Presence of aortocoronary bypass graft: Secondary | ICD-10-CM

## 2010-09-30 DIAGNOSIS — I10 Essential (primary) hypertension: Secondary | ICD-10-CM | POA: Diagnosis present

## 2010-09-30 DIAGNOSIS — Z9861 Coronary angioplasty status: Secondary | ICD-10-CM

## 2010-09-30 DIAGNOSIS — I2510 Atherosclerotic heart disease of native coronary artery without angina pectoris: Secondary | ICD-10-CM | POA: Diagnosis present

## 2010-09-30 DIAGNOSIS — E785 Hyperlipidemia, unspecified: Secondary | ICD-10-CM | POA: Diagnosis present

## 2010-09-30 DIAGNOSIS — R0789 Other chest pain: Principal | ICD-10-CM | POA: Diagnosis present

## 2010-09-30 DIAGNOSIS — M899 Disorder of bone, unspecified: Secondary | ICD-10-CM | POA: Diagnosis present

## 2010-09-30 DIAGNOSIS — Z87898 Personal history of other specified conditions: Secondary | ICD-10-CM

## 2010-09-30 DIAGNOSIS — M4800 Spinal stenosis, site unspecified: Secondary | ICD-10-CM | POA: Diagnosis present

## 2010-09-30 DIAGNOSIS — Z7982 Long term (current) use of aspirin: Secondary | ICD-10-CM

## 2010-09-30 DIAGNOSIS — M949 Disorder of cartilage, unspecified: Secondary | ICD-10-CM | POA: Diagnosis present

## 2010-09-30 DIAGNOSIS — F410 Panic disorder [episodic paroxysmal anxiety] without agoraphobia: Secondary | ICD-10-CM | POA: Diagnosis present

## 2010-09-30 LAB — BASIC METABOLIC PANEL
BUN: 13 mg/dL (ref 6–23)
Calcium: 9.5 mg/dL (ref 8.4–10.5)
GFR calc non Af Amer: >60 mL/min (ref >60)
Potassium: 3.7 mEq/L (ref 3.5–5.1)

## 2010-09-30 LAB — POCT CARDIAC MARKERS
CKMB, poc: 1.3 ng/mL (ref 1.0–8.0)
Myoglobin, poc: 99.7 ng/mL (ref 12–200)
Troponin i, poc: <0.05 ng/mL (ref 0.00–0.09)
Troponin i, poc: <0.05 ng/mL (ref 0.00–0.09)

## 2010-09-30 LAB — CBC
HCT: 33.2 % — ABNORMAL LOW (ref 36.0–46.0)
Hemoglobin: 11.3 g/dL — ABNORMAL LOW (ref 12.0–15.0)
RBC: 3.52 MIL/uL — ABNORMAL LOW (ref 3.87–5.11)
WBC: 5.6 10*3/uL (ref 4.0–10.5)

## 2010-09-30 LAB — CK TOTAL AND CKMB (NOT AT ARMC)
CK, MB: 2.5 ng/mL (ref 0.3–4.0)
Relative Index: 1.7 (ref 0.0–2.5)

## 2010-09-30 LAB — APTT: aPTT: >200 seconds (ref 24–37)

## 2010-09-30 LAB — DIFFERENTIAL
Basophils Absolute: 0 10*3/uL (ref 0.0–0.1)
Lymphocytes Relative: 24 % (ref 12–46)
Neutro Abs: 3.8 10*3/uL (ref 1.7–7.7)

## 2010-09-30 LAB — TROPONIN I: Troponin I: 0.01 ng/mL (ref 0.00–0.06)

## 2010-10-01 LAB — BASIC METABOLIC PANEL
CO2: 28 mEq/L (ref 19–32)
Chloride: 105 mEq/L (ref 96–112)
GFR calc Af Amer: >60 mL/min (ref >60)
Sodium: 138 mEq/L (ref 135–145)

## 2010-10-01 LAB — HEPARIN LEVEL (UNFRACTIONATED): Heparin Unfractionated: 0.47 IU/mL (ref 0.30–0.70)

## 2010-10-01 LAB — CBC
HCT: 31.6 % — ABNORMAL LOW (ref 36.0–46.0)
Hemoglobin: 10.9 g/dL — ABNORMAL LOW (ref 12.0–15.0)
MCH: 32.5 pg (ref 26.0–34.0)
MCHC: 34.5 g/dL (ref 30.0–36.0)
MCV: 94.3 fL (ref 78.0–100.0)
RBC: 3.35 MIL/uL — ABNORMAL LOW (ref 3.87–5.11)

## 2010-10-01 LAB — LIPID PANEL
Cholesterol: 145 mg/dL (ref 0–200)
LDL Cholesterol: 66 mg/dL (ref 0–99)
Total CHOL/HDL Ratio: 2 RATIO
Triglycerides: 33 mg/dL (ref <150)
VLDL: 7 mg/dL (ref 0–40)

## 2010-10-01 LAB — CARDIAC PANEL(CRET KIN+CKTOT+MB+TROPI): Relative Index: 2.4 (ref 0.0–2.5)

## 2010-10-01 NOTE — H&P (Signed)
[name], [name]              ACCOUNT NO.:  [id number]  MEDICAL RECORD NO.:  [id number]           PATIENT TYPE:  I  LOCATION:  2921                         FACILITY:  [facility]  PHYSICIAN:  Jake Bathe, MD      DATE OF BIRTH:  1931-07-12  DATE OF ADMISSION:  09/30/2010 DATE OF DISCHARGE:                             HISTORY & PHYSICAL   CARDIOLOGIST:  Lyn Records, MD  PRIMARY CARE PHYSICIAN:  Hal T. Stoneking, MD  CHIEF COMPLAINT:  Chest pain, shortness of breath.  HISTORY OF PRESENT ILLNESS:  This is a 77 year old female with coronary artery disease, status post bypass on June 01, 2006, by Dr. Tyrone Sage, LIMA to LAD and SVG to RCA, history of hypertension, hyperlipidemia, Raynaud phenomenon and panic disorder, spinal stenosis, osteopenia, multiple myeloma, who presented to [facility] Emergency Department via EMS with chest pain/shortness of breath.  I obtained the history with her son and husband present in the room.  They recently returned from a long car trip from [place], [place] just the other day. D-dimer was drawn in the emergency department was negative. Approximately 5:00 a.m. this morning, she woke up with a hard time breathing she states.  She ended up taking 3 total nitroglycerin perhaps at 6:15 a.m. the timing was difficult for me to completely ascertain as for her to remember as well.  She ended up going back to sleep and then she had chest discomfort approximately 10:00 a.m. that prompted another EMS call.  They returned and at this point she decided to move forward with emergency department evaluation.  The first EMS call was earlier in the morning.  They check a blood pressure at that time and she was 205 systolic, the second time her blood pressure was 190 systolic.  She then went on to the emergency room for further evaluation.  Once in the emergency room, she was having chest pain on and off and was placed on a nitroglycerin drip as well as heparin  drip.  I asked her if she had discomfort like this prior to her bypass surgery and she stated to me that she had never had chest pain before.  Still it was quite difficult for me to be confident with the history that I was obtaining.  She had previous bare-metal stents placed and was participating cardiac rehab when she developed once again chest discomfort back in 2007 which prompted repeat cardiac catheterization and then finally bypass.  Currently, in the emergency department, she is chest pain free and appears quite stable.  PAST MEDICAL HISTORY:  As stated above.  ALLERGIES:  According to [facility] medical records, she is allergic to ERYTHROMYCIN, BUTAZOLIDIN, CILOSTAZOL causes stomach upset, morphine, SIMVASTATIN, PENICILLIN, SULFA DRUGS, CODEINE.  SURGICAL HISTORY:  Tonsillectomy, cataract extractions, bypass surgery in 2007, LIMA to LAD as well as SVG to PDA.  Her last cardiac catheterization was performed on August 17, 2009, by Dr. Verdis Prime. Please see cath report for full details, but in brief, her left ventricular function was overall normal with anterobasal hypokinesis. She had a totally occluded mid right coronary artery within the region of her  previously placed stent.  The proximal 70% in-stent stenosis prior to the origin was small moderate acute marginal branch, left anterior descending and circumflex native vessels were widely patent, first diagonal contains 90% narrowing and is in-stent jail following the left anterior descending stenting procedure prior to bypass in 2008. She had widely patent saphenous vein graft to right coronary artery. She had an atretic LIMA to LAD, the left anterior descending has excellent flow antegrade and is widely patent throughout the stented region, and is overall summary was she had acute coronary syndrome without identifiable anatomic abnormality and perfusion is suspected coronary artery spasm with transient coronary thrombosis due  to plaque rupture with resolution.  Continue medical therapy.  Note, she was admitted again on March 12, 2010, and discharged March 13, 2010, with prolonged left upper parasternal and subclavicular discomfort of uncertain etiology.  Cardiac markers were normal.  SOCIAL HISTORY:  She lives in [facility].  No smoking.  No alcohol use.  FAMILY HISTORY:  Mother had bypass in her 81s.  MEDICATIONS:  According to [facility] medical records, she is on 1. Fluticasone 27.5 mcg spray a day. 2. Sertraline 15 mg one and a half tablets once a day. 3. Nitroglycerin 0.4 p.r.n. 4. Metoprolol 25 mg twice a day. 5. Ativan 0.125 mg tablet at bedtime. 6. Crestor 40 mg once a day. 7. Calcium and vitamin D. 8. Aspirin 325 mg once a day. 9. Over-the-counter biotin. 10.Multivitamin. 11.MiraLax.  REVIEW OF SYSTEMS:  She denies any recent significant changes in lower extremity edema.  No joint problems that are new.  No bleeding.  No syncope, no dizziness.  No recent fevers, cough, orthopnea.  Unless specified above, all other 12 review of systems negative.  PHYSICAL EXAM:  VITAL SIGNS:  Blood pressure 175/54 on arrival, currently 146/60, respirations 11, heart rate 60s, temperature 98.4. GENERAL:  Pleasant, does not appear to be in any acute distress, comfortable laying in her bed with her family at bedside. HEENT:  EYES:  Well-perfused conjunctivae.  EOMI.  No scleral icterus. NECK:  Supple.  No lymphadenopathy.  No thyromegaly.  No carotid bruits. No JVD. CARDIOVASCULAR:  Regular rate and rhythm without any appreciable murmurs, rubs or gallops noted. LUNGS:  Clear to auscultation bilaterally.  Normal respiratory effort. ABDOMEN:  Soft, nontender.  Normoactive bowel sounds.  No bruits. EXTREMITIES:  No clubbing, cyanosis or edema noted. GU:  Deferred. RECTAL:  Deferred. NEURO:  Nonfocal.  No tremors. PSYCH:  Normal affect.  She does not appear to be anxious currently.  DATA:   Chest x-ray demonstrates no acute airspace disease, I personally reviewed, prior sternotomy wires noted.  Point-of-care cardiac markers are normal.  Creatinine is 0.82.  D-dimer was 0.42 within normal range. Hemoglobin 11, white count 5, platelets 256,000.  EKG, I personally reviewed shows sinus bradycardia rate 58 with premature ventricular contraction as well as premature atrial contraction, nonspecific ST-T wave changes.  When compared to prior EKG, PAC/PVC is new but otherwise no ischemic changes are noted.  Prior cardiac catheterization reviewed as above.  ASSESSMENT/PLAN:  This is a 77 year old female with coronary artery disease, status post bypass in 2007 with hypertension, hyperlipidemia, history of panic disorder, who reports to the emergency department with chest pain as well as shortness of breath. 1. Possible acute coronary syndrome/unstable angina - given her prior     history of bypass we will go ahead and admit to Step-down and     continue with already prescribed heparin IV  as well as     nitroglycerin drip.  She has complained of intermittent chest pain     throughout her emergency department stay and we will continue     titrating for antianginal affect.  First set of cardiac markers are     reassuring.  We will continue to cycle cardiac markers throughout     her stay.  I will make her n.p.o. past midnight for further     diagnostic studies.  If cardiac markers are negative, one may wish     to proceed with nuclear stress test or if chest pain is ongoing or     if cardiac markers are abnormal cardiac catheterization will likely     take place.  I will relay to Dr. Katrinka Blazing. 2. Coronary artery disease - prior bypass surgery and stenting as     described above. 3. Hypertension - continue with antihypertensive therapy, which is     metoprolol. 4. Hyperlipidemia - continue with Crestor as she takes at home.  Her     last LDL was 63 in 2010 on [place] medical records. 5.  Dyspnea - currently, not describing dyspnea, but of course with her     long car drive this contoured up the possibility of pulmonary     embolism.  Emergency division has already ordered D-dimer which was     negative.  I do not feel the need given his D-dimer finding to     proceed with CT angiography. 6. Anxiety - continue with current care.     Jake Bathe, MD     MCS/MEDQ  D:  09/30/2010  T:  10/01/2010  Job:  [id number]  cc:   Hal T. Stoneking, M.D. Lyn Records, M.D.  Electronically Signed by Donato Schultz MD on 10/01/2010 08:57:32 PM

## 2010-10-02 ENCOUNTER — Inpatient Hospital Stay (HOSPITAL_COMMUNITY): Payer: Medicare Other

## 2010-10-02 LAB — CBC
HCT: 34.9 % — ABNORMAL LOW (ref 36.0–46.0)
MCH: 32.3 pg (ref 26.0–34.0)
MCV: 95.6 fL (ref 78.0–100.0)
Platelets: 250 10*3/uL (ref 150–400)
RBC: 3.65 MIL/uL — ABNORMAL LOW (ref 3.87–5.11)
RDW: 14.4 % (ref 11.5–15.5)
WBC: 4.5 10*3/uL (ref 4.0–10.5)

## 2010-10-02 MED ORDER — TECHNETIUM TC 99M TETROFOSMIN IV KIT
10.0000 | PACK | Freq: Once | INTRAVENOUS | Status: AC | PRN
  Administered 2010-10-02: 10 via INTRAVENOUS

## 2010-10-02 MED ORDER — TECHNETIUM TC 99M TETROFOSMIN IV KIT
30.0000 | PACK | Freq: Once | INTRAVENOUS | Status: AC | PRN
  Administered 2010-10-02: 30 via INTRAVENOUS

## 2010-10-03 ENCOUNTER — Inpatient Hospital Stay (HOSPITAL_COMMUNITY): Payer: MEDICARE

## 2010-10-17 NOTE — Discharge Summary (Signed)
  NAMEJOCELINE, [name]              ACCOUNT NO.:  [id number]  MEDICAL RECORD NO.:  [id number]           PATIENT TYPE:  I  LOCATION:  3710                         FACILITY:  [facility]  PHYSICIAN:  Corky Crafts, MDDATE OF BIRTH:  Sep 13, 1931  DATE OF ADMISSION:  09/30/2010 DATE OF DISCHARGE:  10/02/2010                              DISCHARGE SUMMARY   PRIMARY CARDIOLOGIST:  Lyn Records, MD  PRIMARY CARE PHYSICIAN:  Hal T. Stoneking, MD  DISCHARGE DIAGNOSES: 1. Coronary artery disease. 2. Chest pain. 3. Hyperlipidemia.  PROCEDURES PERFORMED:  Cardiolite stress test on October 02, 2010, showing no ischemia with an ejection fraction of 71%.  HOSPITAL COURSE:  The patient was admitted with unstable angina-type symptoms.  She had multiple cardiac enzymes drawn, showing no evidence of an MI.  She had resolution of her chest pain and subsequently underwent stress testing, showing no evidence of ischemia, normal LV function.  She did feel that the symptoms she had were different from what she had prior to her bypass.  She had a negative D-dimer and after her stress test, she felt well.  She exercised for 7 minutes and 30 seconds.  DISCHARGE MEDICATIONS: 1. Ativan 0.5 mg 1/2 tablet at bedtime p.r.n. 2. Aspirin 325 mg daily. 3. Biotin. 4. Over-the-counter calcium. 5. Crestor 40 mg daily. 6. Metoprolol 25 mg b.i.d. 7. Multivitamin. 8. Sublingual nitroglycerin p.r.n. 9. Omeprazole 40 mg daily. 10.Polyethylene glycol 4 tablespoons at bedtime. 11.Zoloft 75 mg at bedtime.  DIET:  Low-sodium, heart-healthy diet.  ACTIVITY:  Increase activity slowly.  May shower or bathe.  FOLLOWUP APPOINTMENTS:  With Lyn Records, MD/Cynthia Emelda Fear, ANP, on Oct 16, 2010, at 9:45 a.m.     Corky Crafts, MD     JSV/MEDQ  D:  10/02/2010  T:  10/03/2010  Job:  [id number]  Electronically Signed by Lance Muss MD on 10/17/2010 12:49:24 PM

## 2010-10-22 NOTE — Discharge Summary (Signed)
[name], [name]              ACCOUNT NO.:  [id number]   MEDICAL RECORD NO.:  [id number]          PATIENT TYPE:  INP   LOCATION:  2035                         FACILITY:  [facility]   PHYSICIAN:  Lyn Records, M.D.   DATE OF BIRTH:  22-Dec-1931   DATE OF ADMISSION:  05/16/2007  DATE OF DISCHARGE:  05/17/2007                               DISCHARGE SUMMARY   DISCHARGE DIAGNOSES:  1. Chest pain, resolved.  2. Known coronary artery disease, history of coronary artery bypass      grafting December of 2007 with left internal mammary artery to the      left anterior descending, saphenous vein graft to the right      coronary artery.  3. Hypertension.  4. Paroxysmal atrial fibrillation after surgery but none further.  5. Osteoporosis.  6. Multiple myeloma.  7. Dyslipidemia.  8. Irritable bowel syndrome.  9. Anxiety.  10.Long term medication use.   HISTORY OF PRESENT ILLNESS:  [name] is a 77 year old female with  known history of coronary artery disease as above. She was initially  followed by Dr. Delane Ginger and wishes to establish with Dr. Verdis Prime. She comes in complaining of left sided chest pain, intermittent  for years but this is different than her previous coronary pain. She has  no other symptoms such as dyspnea. She remained in the hospital  overnight. Her cardiac enzymes are negative x3. She was discharged to  home in stable condition. Her EKG is normal.   Dr. Katrinka Blazing felt this was non-cardiac chest pain and questions whether or  not it is related to her multiple myeloma, keeping in mind, however,  that she does have a known history of coronary artery bypass grafting.  She was weaned off her nitroglycerin and ambulated down the hall. She  did well and was discharged to home in stable condition.   DISCHARGE MEDICATIONS:  1. Zegerid 40 mg twice daily.  2. Fosamax 17 mg q. Saturday.  3. Calcium 2 tablets twice a day.  4. Librax 2.5 mg daily.  5. Altace 5 mg daily.  6. Multivitamin daily.  7. Baby aspirin daily.  8. MiraLax 17 grams daily.  9. Sertraline 50 mg daily.  10.Simvastatin 40 mg daily.  11.Metoprolol 25 mg daily.  12.Nitrofurantoin 50 mg daily.  13.Xanax p.r.n.  14.Sublingual nitroglycerin p.r.n. chest pain.   ACTIVITY:  Increase activity slowly.   DIET:  Remain on a low-sodium, heart healthy diet.   FOLLOWUP:  Followup with Dr. Katrinka Blazing on May 31, 2007 at 2:4 5 p.m.      Guy Franco, P.A.      Lyn Records, M.D.  Electronically Signed    LB/MEDQ  D:  05/17/2007  T:  05/17/2007  Job:  [id number]   cc:   Lyn Records, M.D.

## 2010-10-22 NOTE — H&P (Signed)
[name], SEIBOLD NO.:  [id number]   MEDICAL RECORD NO.:  [id number]          PATIENT TYPE:  EMS   LOCATION:  MAJO                         FACILITY:  [facility]   PHYSICIAN:  Lorain Childes, MD DATE OF BIRTH:  [birth year]   DATE OF ADMISSION:  05/16/2007  DATE OF DISCHARGE:                              HISTORY & PHYSICAL   CARDIOLOGIST:  She is switching her cardiologist to Dr. Katrinka Blazing of [facility].  She has previously been seen by Dr. [name].   CHIEF COMPLAINT:  Chest pain.   HISTORY OF PRESENT ILLNESS:  The patient is a 77 year old woman with  known coronary disease, status post CABG in December 2007 with LIMA to  LAD and vein graft to right coronary artery.  She presents to the ER  with chest pain beginning today around 11 a.m.  The patient reports that  she was at church and she began having chest discomfort around 11 a.m.  She rates it at most as a 4 to 5 out of 10, located in her left chest.  She describes it as tightness.  It is nonradiating.  No nausea,  vomiting, no diaphoresis. No palpitations, no syncope associated with  her pain.  She reports that the pain persisted through the late morning  and afternoon.  She returned home from church and laid down, remained in  bed until midafternoon.  As the pain continued she called [facility], and then was referred in.  The patient states that she did  take nitroglycerin 3 tablets total, which did not help her pain much at  all.   In the emergency room she reports that her pain had resolved, however it  recurred during our interview, and was rated 2 out of 10.  Again, she  had no associated symptoms related to it.  She reports that this pain is  similar to her prior angina, and that it gives her strange discomfort in  her left chest, described as tightness, however it is more intense  currently.  She reports that over the past few days, she has not been  feeling so well, and on Friday had seen  her primary care physician due  to some mild shortness of breath.  She also stated that she had an  uncomfortable feeling in her left chest at that time.  She was  instructed to take nitroglycerin should any further discomfort return,  and to keep tabs of this.  She was scheduled for an appointment in  cardiology for Wednesday of this week.  Unfortunately, she described she  began having the above pain, so therefore came to the emergency room for  evaluation.   PAST MEDICAL HISTORY:  1. CAD status post cardiac catheterization presenting with chest      discomfort on May 27, 2007.  Her cath revealed her stents in      her LAD had 90% in-stent restenosis, her diagonal stent had 70% in-      stent restenosis, her circumflex had minor irregularities, and her      right coronary artery had 80% in-stent restenosis.  For that, she      was evaluated by surgery, and Dr. Tyrone Sage performed a CABG on      June 01, 2006 with LIMA to LAD and vein graft  to RCA.  She was      admitted in January 2008 with chest discomfort, which was felt to      be postsurgical chest pain, and she was managed with tramadol.  2. Hypertension.  3. Gastroesophageal reflux disease/ gastritis, for which she is      controlled on Zegerid.  She states that Protonix did not control      her reflux.  4. Irritable bowel disease.  5. Anxiety.  6. Dyslipidemia.  7. Paroxysmal atrial fibrillation, which was noted after her surgery.      She remained on amiodarone until 3 months ago.  8. Osteoporosis.  9. Recent diagnosis of multiple myeloma by Dr. Mancel Bale.  She      describes it as indolent, and not requiring treatment currently.  10.Recurrent UTIs with 4 UTIs in the past 3 months.  She currently      denies any symptoms.   MEDICATIONS:  1. She is on aspirin 81 mg p.o. daily.  2. Altace 5 mg p.o. daily.  3. Metoprolol XL 25 mg p.o. daily.  4. Simvastatin 40 mg p.o. nightly.  5. Zegerid 40 mg p.o. daily.   6. Fosamax 70 mg p.o. weekly.  7. Calcium 2 pills p.o. b.i.d.  8. Librax 3.5 mg p.o. p.r.n.  9. Xanax p.r.n.  10.Tylenol p.r.n.  11.Percocet p.r.n.  12.Tramadol p.r.n.  13.Sertraline 50 mg p.o. daily.  14.Nitrofurantoin 50 mg p.o. daily.   ALLERGIES:  1. CODEINE, WHICH CAUSES VOMITING.  2. PENICILLIN CAUSES RASH AND SWELLING.  3. BUTAZOLIDIN CAUSES RASH AND SWELLING.   SOCIAL HISTORY:  She lives alone in [place].  She is widowed.  She  has a son who is present in the emergency room with her.  She is a  retired [employer] trader.  She denies any tobacco ever.  She drinks 2-3 glasses  of wine a week.  She denies any drug use.  No illegal medication use.  She follows a low-sodium diet, she exercises 3 times a week with the  Silver Sneakers on Monday, Wednesday, Friday for 50 minutes at a time.  Her last exercise session was on Friday in the morning.   FAMILY HISTORY:  Her mother died after having a heart attack at the age  of 45.  She also had CAD diagnosed at the age of 73, and had had a  stroke.  Her father died at the age of 28 from an accident.  He had GI  issues.  She has one sister who has GI problems, but otherwise is  healthy.   REVIEW OF SYSTEMS:  She denies any fever or chills.  HEENT:  She denies  any headache.  She does report visual diplopia, which has been ongoing  for greater than a year.  She denies any skin rashes or lesions.  CARDIOPULMONARY:  She reports the chest pain as stated in HPI, otherwise  she denies any shortness of breath, dyspnea on exertion, orthopnea, PND.  No lower extremity edema, no palpitations, no syncope.  She denies any  nausea, vomiting.  No diarrhea, no bright red blood per rectum.  No  melena, no hematemesis.  She reports GERD symptoms, which are controlled  with her medications.  No abdominal pain, or change in bowel habits.  She denies any  urinary frequency, dysuria currently.  NEUROPSYCHIATRIC:  She denies any weakness or numbness.  She has  anxiety, which is  controlled with the medication.  She has no myalgias or arthralgias.  All other systems are negative.   PHYSICAL EXAMINATION:  VITAL SIGNS:  Temperature is 99.3, pulse 65,  respirations 18.  Blood pressure is 170/80.  She is saturating 100% on  room air.  GENERAL:  She is a very pleasant, elderly female, sitting comfortably in  bed in no acute distress.  HEENT:  Normocephalic, atraumatic.  NECK:  JVP is approximately 6 cm.  She has 2+ carotid upstrokes. There  are no bruits.  She has no thyromegaly.  The neck is supple, No  lymphadenopathy.  CARDIOVASCULAR:  S1 and S2.  Regular rate and rhythm.  She has a soft  2/6 systolic murmur at the left sternal border.  Her PMI is  nondisplaced.  Pulses are 2+ throughout.  No bruits.  LUNGS:  Clear to auscultation.  She does have decreased breath sounds at  the left base, however this improves with deep breathing.  ABDOMEN:  Soft, nontender.  No organomegaly.  EXTREMITIES:  She has no clubbing or cyanosis.  She has trace pedal  edema.  NEUROLOGIC:  She is alert and oriented x3.  Cranial nerves are grossly  intact.  Her strength is appropriate.   Chest x-ray shows no acute cardiopulmonary disease.  EKG shows rate of  72, sinus rhythm with normal axis.  She has normal intervals with PR  interval of 186 msec, QRS 76 msec, QTc of 416 msec.  She has Q-waves inI  and aVL, which is unchanged from EKG dated March 2008.   LABORATORY DATA:  Her hemoglobin is 13.3, hematocrit 39.  Her sodium is  129, her potassium is 4.0, BUN of 17, creatinine is 1.2, glucose is 101.  Point of care cardiac enzymes are negative with a troponin I of less  than 0.05 on 2 draws.  CK-MB is less than 1.1 on 2 draws.   ASSESSMENT AND PLAN:  The patient is a 77 year old female with known  coronary artery disease status post coronary artery bypass graft 1 year  ago now admitted with chest discomfort.  1. Coronary artery disease:  The patient likely has  unstable angina.      Her chest pain description is atypical, but it is somewhat      reminiscent of her prior angina, however more intense per her      description.  We will admit her on telemetry, cycle cardiac      enzymes, follow EKG closely.  We will make her NPO for possible      catheterization tomorrow.  Will start her on Lovenox and      nitroglycerin drip.  Will continue her metoprolol, also simvastatin      and aspirin.  2. Hypertension.  Her blood pressure is elevated.  She has not taken      her evening dose of medication.  I will start her on nitroglycerin      drip, which will improve her blood pressure.  We will titrate her      medications as needed during this hospitalization to achieve      adequate blood pressure control.  3. Gastroesophageal reflux disease.  Will continue her on her Zegerid,      as this works for her, and she states that Protonix has not worked      in the past.  4.  Anxiety.  Will continue her Sertraline and cover her with Xanax as      needed.  5. Recurrent urinary tract infections.  She remains on nitrofurantoin      for chronic suppression, per her urologist.  Will check a UA.  6. Hyponatremia.  Her sodium was low on her laboratory data.  We will      repeat her laboratory studies, and if it remains low, we will free      water restrict her.      Lorain Childes, MD  Electronically Signed     CGF/MEDQ  D:  05/16/2007  T:  05/16/2007  Job:  [id number]

## 2010-10-25 NOTE — Consult Note (Signed)
[name], BEDONIE NO.:  [id number]   MEDICAL RECORD NO.:  [id number]          PATIENT TYPE:  INP   LOCATION:  2915                         FACILITY:  [facility]   PHYSICIAN:  Sheliah Plane, MD    DATE OF BIRTH:  Apr 10, 1932   DATE OF CONSULTATION:  05/26/2006  DATE OF DISCHARGE:                                 CONSULTATION   REQUESTING PHYSICIAN:  [name], M.D.   Elenor Quinones CARDIOLOGIST:  [name], M.D.   PRIMARY CARE PHYSICIAN:  [name], M.D.   REASON FOR CONSULTATION:  Recurrent coronary artery disease with in-  stent stenosis 12 weeks after stent placement with recurrent angina.   HISTORY OF PRESENT ILLNESS:  The patient is a 77 year old female who has  previous history of myocardial infarction in August 2007 while visiting  in [place], [place].  She was transferred urgently to [facility] where on August 12, cardiac catheterization was performed.  Two  bare metal stents were placed, one in the mid LAD which was 85% stenotic  and 80% proximal right stenosis; a stent was placed.  Bare metal stents  were used because of question of patient's previous GI history and  potential intolerance of aspirin.  The patient was initially discharged  home, was doing well and finished her last day of cardiac rehab again  having substernal chest discomfort and was referred to Dr. Elease [name] for  further evaluation.  She was admitted, and on December 14 underwent  repeat cardiac catheterization.  This revealed in-stent stenosis in both  the LAD and the right coronary artery.  A repeat angioplasty and cutting  balloon dilatation of the LAD stent was carried out.  Following this,  the patient had recurrent chest pain and some elevation of troponin and  developed increasing anemia.  She was followed for the last several days  and then underwent repeat cardiac catheterization today, December 18.  The in-stent stenosis of the right coronary artery  was not changed.  She  has an increasing degree of restenosis of the LAD stent.  The patient  now wishes to proceed with bypass surgery.  She has been maintained on  Plavix since this summer including a dose today.  Currently she is  without chest pain on Integrilin.   PAST MEDICAL HISTORY:  The patient denies hypertension, denies  hyperlipidemia, denies diabetes, denies smoking, denies.  The patient  has had no previous stroke, denies claudication, denies renal  insufficiency. Past medical problems include irritable bowel syndrome  and GERD.   PAST SURGICAL HISTORY:  Includes tonsillectomy and cataracts of both  eyes.   FAMILY HISTORY:  There is a positive family history with myocardial  infarction in 1979 in her mother who subsequently had bypass surgery and  had a pacer placed.  She died at age 78 of a myocardial infarction and  question of a stroke.  The patient's father has history of digestive  problems, died in a motor vehicle accident at age 32.   SOCIAL HISTORY:  The patient is a widow; husband died approximately 2  years ago of  carcinoid tumor.  She is retired and has alcohol use.   CURRENT MEDICATIONS:  1. Aspirin 81.  2. Potassium 20 mEq.  3. Lopressor 25 mg p.o. b.i.d.  4. IV Integrilin.  5. Plavix 75 mg a day.  6. Prinivil 5 mg a day.  7. Protonix 40 mg a day.  8. Zocor 40 mg q day.  9. Zoloft 50 mg a day.   ALLERGIES:  The patient notes 50 years ago she developed a rash with  PENICILLIN and also a facial rash with BUTAZOLIDIN.  She notes no  intolerance of aspirin.   REVIEW OF SYSTEMS:  CARDIAC: Positive for chest pain, exertional  shortness of breath.  Denies lower extremity edema, denies syncope,  presyncope, orthopnea.  Several years ago on a stress test, she was  noted to have an extra beat.  GENERAL: The patient denies any  constitutional symptoms.  She does have exertional shortness of breath,  no hemoptysis.  GASTROINTESTINAL:  Symptoms as noted  above.  She denies  any blood in her stool.  Denies amaurosis or TIAs.  Limited being by  significant hip or knee pain.  HEMATOLOGIC:  Denies easy bruisability  but has been on Plavix.  ENDOCRINE: She has no polyuria or polydipsia.  PSYCHIATRIC:  She does not that she does have anxiety attacks since the  1970s.  Other Review of Systems are negative.   PHYSICAL EXAMINATION:  VITAL SIGNS: Blood pressure 135/61, heart rate  74, respiratory rate 14, temperature 97.9.  She is 5 feet 3 inches tall  and 48.6 kg.  GENERAL: The patient is awake, alert, neurologically intact.  HEENT:  Pupils equal, round, and reactive to light.  NECK: Without carotid bruits.  LUNGS: Clear bilaterally.  CARDIAC: Regular rate and rhythm without murmur or gallop.  ABDOMEN:  Exam is benign.  EXTREMITIES:  She just had the sheath removed from the right groin.  There is no hematoma.  Left groin has full femoral pulse.  Popliteal,  DP, and PT pulses are 2+.  Lower extremities are without edema.   LABORATORY FINDINGS:  Improved hematocrit of 35.8, platelet count 228.  Creatinine 1.0, BUN 11, glucose 102.   Cardiac catheterization films are reviewed from Friday and today.  These  films reveal 3 stents placed in the LAD and right coronary artery.  The  initial film on Friday showed at least 80% in-stent stenosis in both the  right and LAD.  Subsequently the LAD was opened to less than 20%.  Repeat cardiac catheterization was done today which shows restenosis of  the LAD and the right coronary artery.   IMPRESSION:  Recurrent coronary artery stenoses with significant disease  involving the left anterior descending (LAD), two small diagonals, and  right coronary artery.  Circumflex is read to be free of disease.  Coronary artery bypass grafting is recommended to the patient.  With the  degree of in-stent stenoses, it is unlikely that continued angioplasties will successfully result in a good situation, especially since  the work  done on the LAD 2 days ago  is already beginning to restenose.  Bypass  surgery is recommended to the patient.  Because of the Plavix, we will  need to wait some period of time for Plavix washout.  I tentatively plan  to proceed with bypass surgery on December 24.  The risks and options of surgery including death, infection, stroke,  myocardial infarction, bleeding, blood transfusion all discussed with  the patient and her daughter in  detail, and she was willing to proceed.  We tentatively plan to do this surgery on December 24.      Sheliah Plane, MD  Electronically Signed     EG/MEDQ  D:  05/26/2006  T:  05/26/2006  Job:  [id number]   cc:   [name], M.D.  [name], M.D.

## 2010-10-25 NOTE — Cardiovascular Report (Signed)
[name], OUTTEN NO.:  [id number]   MEDICAL RECORD NO.:  [id number]          PATIENT TYPE:  INP   LOCATION:  2915                         FACILITY:  [facility]   PHYSICIAN:  Vesta Mixer, M.D. DATE OF BIRTH:  [birth year]   DATE OF PROCEDURE:  05/26/2006  DATE OF DISCHARGE:                            CARDIAC CATHETERIZATION   Ms. Parilla is a 77 year old female with a history of coronary artery  disease.  She is status post cutting balloon angioplasty to her LAD  stent last week.  She presents today for relook at her LAD after having  chest pain and EKG changes this weekend.  We also had anticipated doing  PTCA cutting balloon to her right coronary stent.   The right femoral artery was easily cannulated using modified Seldinger  technique.   HEMODYNAMIC RESULTS:  LV pressure is 133/13 with an aortic pressure of  132/61.   ANGIOGRAPHY:  Left main:  The left main is fairly smooth and normal.   The left anterior descending artery is fairly normal in the proximal  segment.  The mid-LAD stent has profound restenosis with a plaque  disruption.  The stent has renarrowed to approximately 90%.  There is  still TIMI grade 3 flow down the stent.  There is a first diagonal  artery that has a 70% stenosis proximally.  The second diagonal artery  is a relatively small branch and is unremarkable.  The remainder of the  distal LAD is unremarkable.  There are several septal perforators that  are subtotally occluded.  These are not candidates for  revascularizations.   The left circumflex artery is a moderate-sized vessel.  There are minor  luminal irregularities in the left circumflex artery.   The right coronary artery is large and dominant.  There is a long  stented segment in the mid-vessel.  This segment is restenosed to  approximately 80%.  The posterior descending artery and the  posterolateral segment artery are unremarkable.   The left ventriculogram was  performed in the 30 RAO position.  It  reveals normal left ventricular systolic function.  In particular, the  anterior wall and apex contract normally.   COMPLICATIONS:  None.   CONCLUSION:  Severe in-stent restenosis involving the left anterior  descending artery and the right coronary artery.  She did not respond  well to the cutting balloon angioplasty from last week.  We will refer  her to CVTS for consideration for bypass surgery.  She still has normal  left ventricular systolic function.  We will discuss this with the  family and the patient.           ______________________________  Vesta Mixer, M.D.     PJN/MEDQ  D:  05/26/2006  T:  05/27/2006  Job:  [id number]   cc:   Hal T. Stoneking, M.D.  Sheliah Plane, MD

## 2010-10-25 NOTE — Op Note (Signed)
[name], [name]              ACCOUNT NO.:  [id number]   MEDICAL RECORD NO.:  [id number]          PATIENT TYPE:  INP   LOCATION:  2307                         FACILITY:  [facility]   PHYSICIAN:  Sheliah Plane, MD    DATE OF BIRTH:  March 17, 1932   DATE OF PROCEDURE:  06/01/2006  DATE OF DISCHARGE:                               OPERATIVE REPORT   PREOPERATIVE DIAGNOSES:  1. Coronary occlusive disease status post bare metal stenting of the      left anterior descending and circumflex.  2. Failure of stents.   POSTOPERATIVE DIAGNOSES:  1. Coronary occlusive disease status post bare metal stenting of the      left anterior descending and circumflex.  2. Failure of stents.   SURGICAL PROCEDURE:  Coronary artery bypass grafting x2, off-pump, with  left internal mammary to the left anterior descending coronary artery  and reverse saphenous vein graft to the distal right coronary artery  with left leg endovein harvesting.   SURGEON:  Dr. Tyrone Sage.   FIRST ASSISTANT:  Zadie Rhine, P.A>   BRIEF HISTORY:  The patient is a 77 year old female who approximately 12  weeks prior had undergone coronary artery stenting at [facility]  where bare metal stents were placed in the LAD and diagonal.  On her  last day of cardiac rehab she began having recurrent chest pain, she had  been maintained on aspirin and Plavix.  She was admitted by Dr. [name]  and initially attempted cutting balloon angioplasty on the LAD.  A stent  was carried out.  However, following this the patient continued to have  chest pain.  Repeat cardiac catheterization showed restenosis, over 2  days, of the lesion.  At this point the patient was referred for  surgery.  She continued on Plavix and aspirin.  The Plavix was held.  She was placed on Integrilin.  Risks and options of surgery were  discussed with the patient in detail and she was willing to proceed.   DESCRIPTION OF THE PROCEDURE:  With Theone Murdoch and  arterial line monitors  in place, the patient underwent general endotracheal anesthesia without  incident.  The skin of the chest and legs was prepped with Betadine,  draped in the usual sterile manner.  Initially, vein was identified and  a small incision made at the right knee but no suitable vein could be  identified.  A small incision was made in the left knee and the vein was  identified.  Using the Guidant endovein harvesting system the vein from  the left thigh was harvested.  Median sternotomy was performed.  Left  internal mammary artery was dissected down as a pedicle graft.  The  distal artery was divided and had good free flow.  Pericardium was  opened.  Both the LAD and distal right were easily identifiable.  Patient was systemically heparinized and it was decided to proceed with  off-pump coronary artery bypass grafting.  Using the Guidant  stabilization devices, the LAD was stabilized.  Proximal and distal  __________ tapes were placed.  The LAD was opened, blood flow  controlled.  The patient remained hemodynamically stable.  Using running  #8-0 Prolene, the left internal mammary artery was anastomosed to the  left anterior descending coronary artery.  With release of the bulldog,  the anastomosis was without bleeding.  Doppler showed audible flow.  The  fascia was tacked to the epicardium.  This segment of angle was trimmed  to the appropriate length.  A partial occlusion clamp was placed on the  ascending aorta and a single punch aortotomy was performed.  Using the  segment, a reverse saphenous vein graft was first anastomosed to the  ascending aorta.  Using the stabilization device, the acute margin of  the heart was elevated and the distal right coronary artery was  encircled with __________ tapes.  The vessel was opened.  The lesion was  controlled and using a running #7-0 Prolene the distal segment of the  vein was anastomosed to the posterior descending.  Air was  evacuated  from the grafts and had good free flow.  The patient remained  hemodynamically stable.  Sites of anastomosis were inspected.  Pacing  wires were placed on the atrium and ventricle with the operative field  hemostatic.  Left pleural tube and mediastinal Blake drain were left in  place.  The pericardium was reapproximated, graft markers were applied.  The sternum was closed with a #6 stainless steel wire, fascia closed  using interrupted #0 Vicryl, running #3-0 Vicryl in subcutaneous tissue,  #4-0 subcuticular stitch in the skin edges.  Dry dressings were applied.  The sponge and needle count was reported as correct at the completion of  the procedure.  Because of some diffuse oozing and a history of being on  Plavix and Integrilin, the patient was administered Plavix.      Sheliah Plane, MD  Electronically Signed     EG/MEDQ  D:  06/01/2006  T:  06/01/2006  Job:  [id number]   cc:   Vesta Mixer, M.D.

## 2010-10-29 ENCOUNTER — Observation Stay (HOSPITAL_COMMUNITY)
Admission: AD | Admit: 2010-10-29 | Discharge: 2010-10-30 | Disposition: A | Discharge status: Home / Self Care | Payer: Medicare Other | Source: Other Acute Inpatient Hospital | Attending: Interventional Cardiology | Admitting: Interventional Cardiology

## 2010-10-29 ENCOUNTER — Emergency Department (HOSPITAL_BASED_OUTPATIENT_CLINIC_OR_DEPARTMENT_OTHER): Payer: Medicare Other

## 2010-10-29 ENCOUNTER — Emergency Department (INDEPENDENT_AMBULATORY_CARE_PROVIDER_SITE_OTHER): Payer: Medicare Other

## 2010-10-29 ENCOUNTER — Emergency Department (HOSPITAL_BASED_OUTPATIENT_CLINIC_OR_DEPARTMENT_OTHER)
Admission: EM | Admit: 2010-10-29 | Discharge: 2010-10-29 | Disposition: A | Discharge status: Other Acute Inpatient Hospital | Payer: Medicare Other | Source: Home / Self Care | Attending: Emergency Medicine | Admitting: Emergency Medicine

## 2010-10-29 DIAGNOSIS — R079 Chest pain, unspecified: Secondary | ICD-10-CM | POA: Insufficient documentation

## 2010-10-29 DIAGNOSIS — I10 Essential (primary) hypertension: Secondary | ICD-10-CM | POA: Insufficient documentation

## 2010-10-29 DIAGNOSIS — Z79899 Other long term (current) drug therapy: Secondary | ICD-10-CM | POA: Insufficient documentation

## 2010-10-29 DIAGNOSIS — I2510 Atherosclerotic heart disease of native coronary artery without angina pectoris: Secondary | ICD-10-CM | POA: Insufficient documentation

## 2010-10-29 DIAGNOSIS — Z951 Presence of aortocoronary bypass graft: Secondary | ICD-10-CM

## 2010-10-29 DIAGNOSIS — Z7902 Long term (current) use of antithrombotics/antiplatelets: Secondary | ICD-10-CM | POA: Insufficient documentation

## 2010-10-29 DIAGNOSIS — F411 Generalized anxiety disorder: Secondary | ICD-10-CM | POA: Insufficient documentation

## 2010-10-29 DIAGNOSIS — K589 Irritable bowel syndrome without diarrhea: Secondary | ICD-10-CM | POA: Insufficient documentation

## 2010-10-29 DIAGNOSIS — C9000 Multiple myeloma not having achieved remission: Secondary | ICD-10-CM | POA: Insufficient documentation

## 2010-10-29 DIAGNOSIS — K219 Gastro-esophageal reflux disease without esophagitis: Secondary | ICD-10-CM | POA: Insufficient documentation

## 2010-10-29 DIAGNOSIS — E785 Hyperlipidemia, unspecified: Secondary | ICD-10-CM | POA: Insufficient documentation

## 2010-10-29 DIAGNOSIS — M4800 Spinal stenosis, site unspecified: Secondary | ICD-10-CM | POA: Insufficient documentation

## 2010-10-29 DIAGNOSIS — F341 Dysthymic disorder: Secondary | ICD-10-CM | POA: Insufficient documentation

## 2010-10-29 DIAGNOSIS — Z7982 Long term (current) use of aspirin: Secondary | ICD-10-CM | POA: Insufficient documentation

## 2010-10-29 DIAGNOSIS — I658 Occlusion and stenosis of other precerebral arteries: Secondary | ICD-10-CM | POA: Insufficient documentation

## 2010-10-29 LAB — DIFFERENTIAL
Eosinophils Absolute: 0.1 10*3/uL (ref 0.0–0.7)
Eosinophils Relative: 2 % (ref 0–5)
Lymphocytes Relative: 30 % (ref 12–46)
Lymphs Abs: 1.6 10*3/uL (ref 0.7–4.0)
Monocytes Absolute: 0.7 10*3/uL (ref 0.1–1.0)

## 2010-10-29 LAB — CBC
HCT: 33.2 % — ABNORMAL LOW (ref 36.0–46.0)
MCHC: 34.3 g/dL (ref 30.0–36.0)
MCV: 93.5 fL (ref 78.0–100.0)
Platelets: 192 10*3/uL (ref 150–400)
RDW: 13.7 % (ref 11.5–15.5)
WBC: 5.4 10*3/uL (ref 4.0–10.5)

## 2010-10-29 LAB — COMPREHENSIVE METABOLIC PANEL
AST: 35 U/L (ref 0–37)
Albumin: 3.9 g/dL (ref 3.5–5.2)
Alkaline Phosphatase: 46 U/L (ref 39–117)
BUN: 22 mg/dL (ref 6–23)
Chloride: 99 mEq/L (ref 96–112)
GFR calc Af Amer: >60 mL/min (ref >60)
Potassium: 4 mEq/L (ref 3.5–5.1)
Total Bilirubin: 0.2 mg/dL — ABNORMAL LOW (ref 0.3–1.2)

## 2010-10-30 LAB — CARDIAC PANEL(CRET KIN+CKTOT+MB+TROPI)
CK, MB: 2.2 ng/mL (ref 0.3–4.0)
Relative Index: 1.6 (ref 0.0–2.5)
Relative Index: 1.6 (ref 0.0–2.5)
Troponin I: <0.3 ng/mL (ref <0.30)

## 2010-10-30 NOTE — H&P (Signed)
[name] NO.:  [id number]  MEDICAL RECORD NO.:  [id number]           PATIENT TYPE:  I  LOCATION:  2008                         FACILITY:  [facility]  PHYSICIAN:  Zacarias Pontes, MD       DATE OF BIRTH:  [birth year]  DATE OF ADMISSION:  10/29/2010 DATE OF DISCHARGE:                             HISTORY & PHYSICAL   PRIMARY CARDIOLOGIST:  Lyn Records, MD  PRIMARY CARE PHYSICIAN:  Hal T. Stoneking, MD  CHIEF COMPLAINT:  Chest pain.  HISTORY OF PRESENT ILLNESS:  [name] is a pleasant 77 year old woman with a history of coronary artery disease requiring coronary artery bypass grafting and PCI in the past, a recent un-suggestive nuclear stress test, anxiety, and GERD who presents with two chest pain episodes today.  The first episode occurred at rest after an aerobics session and a subsequent walking session.  She describes a dull tightness in her left chest lasting minutes, which responded well to her self-administration of three consecutive sublingual nitroglycerin tablets.  She remained chest painfree for the remainder of the afternoon.  The second episode of chest discomfort occurred while she was walking to dinner at her retirement home.  Her discomfort was made better, but not eliminated by serial sublingual nitroglycerin administration.  She denies nausea and diaphoresis.  Her pain episodes do not seem to have been associated that he will correct away with exertion.  She has been taking all of her medicines consistently including her antireflux medication.  She does report baseline stressors at home in the setting of a new marriage and with her chronic anxiety.  Because of her concern that her symptoms might somehow be cardiac in nature, she presented for evaluation of the local emergency room and was subsequently transferred to [facility] for further evaluation.  PAST MEDICAL HISTORY: 1. Coronary artery disease, status post  coronary artery bypass     grafting with a LIMA to LAD and SVG to the RCA.  Catheterization     done in March 2011 showed a patent native LAD with a 90% ostial LAD-     D1 and atretic LIMA to LAD graft.  Her left circumflex was patent.     Her RCA had a total occlusion in the mid vessel and she had a     patent SVG to RCA graft.  An LV gram at that time showed     anterobasal severe hypokinesis with overall preserved left     ventricular ejection fraction. 2. Hyperlipidemia. 3. Hypertension. 4. Raynaud's. 5. Spinal stenosis. 6. Osteopenia. 7. Multiple myeloma. 8. Gastroesophageal reflux disease. 9. Panic disorder. 10.Anxiety.  SOCIAL HISTORY:  She lives in a retirement home as an independent resident.  She lives with her current husband, which is her second marriage.  She was married 6 months ago.  FAMILY HISTORY:  Noncontributory to this presentation.  REVIEW OF SYSTEMS:  Per HPI, otherwise is comprehensively negative.  ALLERGIES:  PENICILLIN, SULFA, CODEINE, and ERYTHROMYCIN.  MEDICATIONS: 1. Aspirin 325 mg daily. 2. Rosuvastatin 40 mg p.o. daily. 3. Metoprolol 25 mg p.o. b.i.d. 4. Omeprazole 40 mg  p.o. daily. 5. Zoloft 75 mg p.o. nightly. 6. Polyethylene glycol 4 tablespoons at bedtime. 7. Ativan 0.25 mg as needed nightly.  PHYSICAL EXAMINATION:  A comprehensive cardiovascular exam was performed. VITAL SIGNS:  Heart rate is in the 70s with a blood pressure of 136/70, she is breathing comfortably at 16 times per minute with normal oxygen saturation. HEENT:  Notable for a supple neck with no masses or lymphadenopathy. Her JVP is not appreciably elevated. CARDIAC:  Notable for normal S1 and S2 with no murmurs, gallops, or rubs appreciated. LUNGS:  Clear to auscultation bilaterally with no crackles or wheezes appreciated. ABDOMEN:  Soft, nontender, nondistended with positive bowel sounds and no abdominal bruits appreciated. EXTREMITIES:  Warm and well perfused with  trace right lower extremity edema, which she claims as dependent in nature and chronic.  She had no left lower extremity edema.  DP pulses are 2+ bilaterally in her lower extremities. NEUROLOGIC:  She is alert and oriented x3 with no detectable focal neurologic deficits.  LABORATORY DATA:  Her white count is normal at 5.4, her hematocrit is 33, her platelets are 192,000.  Sodium 138, potassium 4.0, chloride 99, bicarb 26, BUN 22, creatinine 0.8, glucose 121.  Troponin is less than 0.3.  Her EKG demonstrates normal sinus rhythm with no ST-segment abnormalities.  A nuclear stress done in April 2012 showed no inducible ischemia and no ischemic symptoms after 7 minutes and 30 seconds on the treadmill.  Of note, a cardiac catheterization in March 2011 revealed anatomy as described above.  IMPRESSION:  This is a 77 year old woman with an extensive coronary history presenting with atypical chest pain episodes on the day of admission with no enzymatic or electrocardiographic evidence of ischemia.  Her symptoms seem more likely to be related to a combination of stress and/or GI driven symptomatology.  I have a low suspicion for an acute coronary syndrome, though her coronary history obligates US to pursue a complete evaluation and rule out.  PLANS:  We will admit her to telemetry for serial cardiac enzyme evaluation.  If her biomarker trend is negative, as I suspect it will be, then in light of recent reassuring nuclear stress testing together with her atypical symptoms description, I do not think she would warrant any further cardiac testing at that point.  Should her enzymes remain un- suggestive together with absent symptoms and lack of ECG changes, I think she can be safely discharged home in the near term.  She does seem to be chronically under considerable stress with significant daily anxieties.  We discussed these at length and talked about strategies for mitigating some of these  anxieties.  In addition, she was encouraged to continue taking her proton pump inhibitor on a regular basis and to follow up with her GI specialist.  The patient voiced an understanding of the plan, and her questions were answered to the best of my ability.          ______________________________ Zacarias Pontes, MD     DM/MEDQ  D:  10/30/2010  T:  10/30/2010  Job:  [id number]  Electronically Signed by Zacarias Pontes MD on 10/30/2010 02:23:06 PM

## 2010-11-27 NOTE — Discharge Summary (Signed)
  [name], [name]              ACCOUNT NO.:  [id number]  MEDICAL RECORD NO.:  [id number]  LOCATION:  2008                         FACILITY:  [facility]  PHYSICIAN:  Corky Crafts, MDDATE OF BIRTH:  11-05-1931  DATE OF ADMISSION:  10/29/2010 DATE OF DISCHARGE:  10/30/2010                              DISCHARGE SUMMARY   PRIMARY CARDIOLOGIST:  Lyn Records, MD  FINAL DIAGNOSES: 1. Atypical chest pain. 2. Coronary artery disease. 3. Gastroesophageal reflux disease. 4. Anxiety.  HOSPITAL COURSE:  The patient was admitted after having a couple of episodes of atypical chest pain.  She has had a thorough workup in the recent past including a stress test about a month ago showing no evidence of ischemia.  She had had a catheterization also within the past year.  She ruled out for MI by enzymes.  Her pain resolved.  She did want to try some long-acting nitrates to see if this would help her pain.  She walked around the floor and had no significant chest pain or anginal symptoms.  DISCHARGE MEDICATIONS: 1. Tylenol 650 mg p.o. q.4 h. P.r.n. 2. Imdur 30 mg p.o. daily. 3. Artificial tears b.i.d. p.r.n. 4. Ativan 0.25 mg q.h.s. p.r.n. insomnia. 5. Aspirin 325 mg daily. 6. Biotin. 7. Calcium. 8. Crestor 40 mg daily. 9. Metoprolol 25 mg p.o. b.i.d. 10.Multivitamin. 11.Sublingual nitroglycerin p.r.n. 12.Omeprazole 40 mg daily. 13.Polyethylene glycol. 14.Zoloft 75 mg daily.  LAB WORK:  Showed troponin being negative.  ACTIVITY:  Resume activity as tolerated.  DIET:  Low-salt, heart-healthy diet.  FOLLOWUP:  With Dr. Katrinka Blazing as scheduled.     Corky Crafts, MD     JSV/MEDQ  D:  11/14/2010  T:  11/15/2010  Job:  [id number]  Electronically Signed by Lance Muss MD on 11/27/2010 12:10:58 PM

## 2010-12-17 DIAGNOSIS — N83209 Unspecified ovarian cyst, unspecified side: Secondary | ICD-10-CM | POA: Insufficient documentation

## 2011-02-13 ENCOUNTER — Other Ambulatory Visit: Payer: Self-pay | Admitting: Oncology

## 2011-02-13 ENCOUNTER — Encounter (HOSPITAL_BASED_OUTPATIENT_CLINIC_OR_DEPARTMENT_OTHER): Payer: Medicare Other | Admitting: Oncology

## 2011-02-13 DIAGNOSIS — C9000 Multiple myeloma not having achieved remission: Secondary | ICD-10-CM

## 2011-02-13 DIAGNOSIS — D630 Anemia in neoplastic disease: Secondary | ICD-10-CM

## 2011-02-13 DIAGNOSIS — M899 Disorder of bone, unspecified: Secondary | ICD-10-CM

## 2011-02-13 LAB — CBC WITH DIFFERENTIAL/PLATELET
BASO%: 0.4 % (ref 0.0–2.0)
EOS%: 2.4 % (ref 0.0–7.0)
LYMPH%: 30.1 % (ref 14.0–49.7)
MCH: 33.3 pg (ref 25.1–34.0)
MCHC: 34.1 g/dL (ref 31.5–36.0)
MONO#: 0.5 10*3/uL (ref 0.1–0.9)
MONO%: 12.4 % (ref 0.0–14.0)
NEUT%: 54.7 % (ref 38.4–76.8)
Platelets: 231 10*3/uL (ref 145–400)
RBC: 3.28 10*6/uL — ABNORMAL LOW (ref 3.70–5.45)
WBC: 4.1 10*3/uL (ref 3.9–10.3)

## 2011-02-17 LAB — COMPREHENSIVE METABOLIC PANEL
ALT: 22 U/L (ref 0–35)
AST: 34 U/L (ref 0–37)
Creatinine, Ser: 0.94 mg/dL (ref 0.50–1.10)
Total Bilirubin: 0.3 mg/dL (ref 0.3–1.2)

## 2011-02-17 LAB — PROTEIN ELECTROPHORESIS, SERUM
Alpha-1-Globulin: 3.6 % (ref 2.9–4.9)
Alpha-2-Globulin: 9 % (ref 7.1–11.8)
Beta Globulin: 4.7 % (ref 4.7–7.2)
Gamma Globulin: 4.4 % — ABNORMAL LOW (ref 11.1–18.8)
M-Spike, %: 1.99 g/dL
Total Protein, Serum Electrophoresis: 7.7 g/dL (ref 6.0–8.3)

## 2011-02-20 ENCOUNTER — Encounter (HOSPITAL_BASED_OUTPATIENT_CLINIC_OR_DEPARTMENT_OTHER): Payer: Medicare Other | Admitting: Oncology

## 2011-02-20 DIAGNOSIS — D630 Anemia in neoplastic disease: Secondary | ICD-10-CM

## 2011-02-20 DIAGNOSIS — M949 Disorder of cartilage, unspecified: Secondary | ICD-10-CM

## 2011-02-20 DIAGNOSIS — C9000 Multiple myeloma not having achieved remission: Secondary | ICD-10-CM

## 2011-02-20 DIAGNOSIS — D649 Anemia, unspecified: Secondary | ICD-10-CM

## 2011-03-17 LAB — BASIC METABOLIC PANEL
CO2: 30
Chloride: 95 — ABNORMAL LOW
Creatinine, Ser: 0.93
GFR calc Af Amer: >60
Potassium: 3.7

## 2011-03-17 LAB — B-NATRIURETIC PEPTIDE (CONVERTED LAB): Pro B Natriuretic peptide (BNP): 174 — ABNORMAL HIGH

## 2011-03-17 LAB — URINALYSIS, ROUTINE W REFLEX MICROSCOPIC
Ketones, ur: NEGATIVE
Nitrite: NEGATIVE
Protein, ur: NEGATIVE
Urobilinogen, UA: 0.2
pH: 7.5

## 2011-03-17 LAB — LIPID PANEL
Cholesterol: 159
HDL: 59
Triglycerides: 60

## 2011-03-17 LAB — POCT CARDIAC MARKERS
CKMB, poc: <1 — ABNORMAL LOW
Myoglobin, poc: 68
Operator id: 196461

## 2011-03-17 LAB — COMPREHENSIVE METABOLIC PANEL
BUN: 17
CO2: 29
Calcium: 9.4
GFR calc non Af Amer: 51 — ABNORMAL LOW
Glucose, Bld: 91
Total Protein: 7.2

## 2011-03-17 LAB — CBC
HCT: 30.2 — ABNORMAL LOW
Hemoglobin: 10.5 — ABNORMAL LOW
MCHC: 33.8
MCHC: 34.6
MCV: 94.8
RBC: 3.19 — ABNORMAL LOW
RBC: 3.31 — ABNORMAL LOW
RDW: 14.2
WBC: 4.3

## 2011-03-17 LAB — CK TOTAL AND CKMB (NOT AT ARMC): CK, MB: 2.4

## 2011-03-17 LAB — TROPONIN I: Troponin I: 0.01

## 2011-03-17 LAB — DIFFERENTIAL
Basophils Relative: 1
Lymphs Abs: 1.2
Monocytes Relative: 12
Neutro Abs: 2.3
Neutrophils Relative %: 55

## 2011-03-17 LAB — APTT: aPTT: 29

## 2011-03-17 LAB — PROTIME-INR: INR: 1.1

## 2011-03-17 LAB — TSH: TSH: 5.901 — ABNORMAL HIGH

## 2011-03-21 LAB — CBC
HCT: 33.5 — ABNORMAL LOW
MCHC: 33.9
MCV: 93.9
Platelets: 279
RBC: 3.57 — ABNORMAL LOW
WBC: 3.7 — ABNORMAL LOW

## 2011-03-21 LAB — DIFFERENTIAL
Basophils Relative: 1
Eosinophils Absolute: 0.3
Eosinophils Relative: 8 — ABNORMAL HIGH
Lymphs Abs: 1.2
Monocytes Relative: 10
Neutrophils Relative %: 48

## 2011-03-21 LAB — CHROMOSOME ANALYSIS, BONE MARROW

## 2011-06-23 ENCOUNTER — Telehealth: Payer: Self-pay | Admitting: Oncology

## 2011-06-23 NOTE — Telephone Encounter (Signed)
pt called and scheduled her pending apps for [phone]

## 2011-06-30 ENCOUNTER — Encounter (HOSPITAL_COMMUNITY): Payer: Self-pay | Admitting: *Deleted

## 2011-06-30 ENCOUNTER — Emergency Department (HOSPITAL_COMMUNITY)
Admission: EM | Admit: 2011-06-30 | Discharge: 2011-06-30 | Disposition: A | Discharge status: Home / Self Care | Payer: Medicare Other | Attending: Emergency Medicine | Admitting: Emergency Medicine

## 2011-06-30 ENCOUNTER — Emergency Department (HOSPITAL_COMMUNITY): Payer: Medicare Other

## 2011-06-30 ENCOUNTER — Other Ambulatory Visit: Payer: Self-pay

## 2011-06-30 DIAGNOSIS — R259 Unspecified abnormal involuntary movements: Secondary | ICD-10-CM | POA: Insufficient documentation

## 2011-06-30 DIAGNOSIS — Z951 Presence of aortocoronary bypass graft: Secondary | ICD-10-CM | POA: Insufficient documentation

## 2011-06-30 DIAGNOSIS — K219 Gastro-esophageal reflux disease without esophagitis: Secondary | ICD-10-CM | POA: Insufficient documentation

## 2011-06-30 DIAGNOSIS — F411 Generalized anxiety disorder: Secondary | ICD-10-CM | POA: Insufficient documentation

## 2011-06-30 DIAGNOSIS — I10 Essential (primary) hypertension: Secondary | ICD-10-CM | POA: Insufficient documentation

## 2011-06-30 DIAGNOSIS — R0602 Shortness of breath: Secondary | ICD-10-CM | POA: Insufficient documentation

## 2011-06-30 DIAGNOSIS — R079 Chest pain, unspecified: Secondary | ICD-10-CM | POA: Insufficient documentation

## 2011-06-30 DIAGNOSIS — I2510 Atherosclerotic heart disease of native coronary artery without angina pectoris: Secondary | ICD-10-CM | POA: Insufficient documentation

## 2011-06-30 HISTORY — DX: Anxiety disorder, unspecified: F41.9

## 2011-06-30 HISTORY — DX: Presence of aortocoronary bypass graft: Z95.1

## 2011-06-30 HISTORY — DX: Essential (primary) hypertension: I10

## 2011-06-30 HISTORY — DX: Gastro-esophageal reflux disease without esophagitis: K21.9

## 2011-06-30 LAB — CBC
HCT: 33.6 % — ABNORMAL LOW (ref 36.0–46.0)
Hemoglobin: 10.9 g/dL — ABNORMAL LOW (ref 12.0–15.0)
MCH: 31.1 pg (ref 26.0–34.0)
MCHC: 32.4 g/dL (ref 30.0–36.0)
MCV: 96 fL (ref 78.0–100.0)
RBC: 3.5 MIL/uL — ABNORMAL LOW (ref 3.87–5.11)

## 2011-06-30 LAB — COMPREHENSIVE METABOLIC PANEL
Albumin: 3.9 g/dL (ref 3.5–5.2)
Alkaline Phosphatase: 38 U/L — ABNORMAL LOW (ref 39–117)
BUN: 15 mg/dL (ref 6–23)
Calcium: 10 mg/dL (ref 8.4–10.5)
Creatinine, Ser: 0.77 mg/dL (ref 0.50–1.10)
GFR calc Af Amer: >90 mL/min (ref >90)
Glucose, Bld: 97 mg/dL (ref 70–99)
Potassium: 3.4 mEq/L — ABNORMAL LOW (ref 3.5–5.1)
Total Protein: 8.2 g/dL (ref 6.0–8.3)

## 2011-06-30 LAB — URINE CULTURE
Colony Count: NO GROWTH
Culture: NO GROWTH

## 2011-06-30 LAB — URINALYSIS, ROUTINE W REFLEX MICROSCOPIC
Glucose, UA: NEGATIVE mg/dL
Hgb urine dipstick: NEGATIVE
Ketones, ur: NEGATIVE mg/dL
Protein, ur: NEGATIVE mg/dL

## 2011-06-30 LAB — DIFFERENTIAL
Basophils Relative: 0 % (ref 0–1)
Eosinophils Absolute: 0.1 10*3/uL (ref 0.0–0.7)
Eosinophils Relative: 2 % (ref 0–5)
Lymphs Abs: 1.2 10*3/uL (ref 0.7–4.0)
Monocytes Absolute: 0.4 10*3/uL (ref 0.1–1.0)
Monocytes Relative: 8 % (ref 3–12)

## 2011-06-30 LAB — PROTIME-INR
INR: 1.01 (ref 0.00–1.49)
Prothrombin Time: 13.5 seconds (ref 11.6–15.2)

## 2011-06-30 NOTE — ED Provider Notes (Signed)
History     CSN: [id number]  Arrival date & time 06/30/11  1259   First MD Initiated Contact with Patient 06/30/11 1323      Chief Complaint  Patient presents with  . Chest Pain  . Shortness of Breath    (Consider location/radiation/quality/duration/timing/severity/associated sxs/prior treatment) HPI Comments: Pt is a 78 year old woman who woke up with shortness of breath.  She did some deep breathing and felt better.  Around 11 A.M. She felt some tightness in her chest.  She thought she was having a panic attack--she had chest tightness and shaking.  She called the RN at her retirement facility, who measured her blood pressure and it was 210/85.  She has had CABG 4 years ago, and so she was sent to [facility] ED for evaluation.  Patient is a 78 y.o. female presenting with chest pain. The history is provided by the patient and medical records. No language interpreter was used.  Chest Pain The chest pain began 1 - 2 hours ago. Duration of episode(s) is 15 minutes. Chest pain occurs rarely. The chest pain is resolved. The pain is associated with stress. The severity of the pain is mild. The quality of the pain is described as tightness. Exacerbated by: Nothing. Primary symptoms include shortness of breath. She tried nothing for the symptoms. Risk factors include being elderly and stress (History of coronary artery disease, CABG).  Her past medical history is significant for CAD.     Past Medical History  Diagnosis Date  . Hx of CABG   . Hypertension   . GERD (gastroesophageal reflux disease)   . Anxiety     History reviewed. No pertinent past surgical history.  History reviewed. No pertinent family history.  History  Substance Use Topics  . Smoking status: Never Smoker   . Smokeless tobacco: Not on file  . Alcohol Use: No    OB History    Grav Para Term Preterm Abortions TAB SAB Ect Mult Living                  Review of Systems  Constitutional: Negative.   HENT:  Negative.   Eyes: Negative.   Respiratory: Positive for shortness of breath.   Cardiovascular: Positive for chest pain.  Gastrointestinal: Negative.   Genitourinary: Negative.   Musculoskeletal: Negative.   Skin: Negative.   Neurological: Negative.   Psychiatric/Behavioral: The patient is nervous/anxious.     Allergies  Ibuprofen; Morphine and related; and Penicillins  Home Medications  No current outpatient prescriptions on file.  BP 165/70  Pulse 55  Temp(Src) 98 F (36.7 C) (Oral)  Resp 16  SpO2 98%  Physical Exam  Nursing note and vitals reviewed. Constitutional: She is oriented to person, place, and time.       Slender elderly woman in no distress.  HENT:  Head: Normocephalic and atraumatic.  Right Ear: External ear normal.  Left Ear: External ear normal.  Mouth/Throat: Oropharynx is clear and moist.  Eyes: Conjunctivae and EOM are normal. Pupils are equal, round, and reactive to light.  Neck: Normal range of motion. Neck supple.  Cardiovascular: Normal rate, regular rhythm and normal heart sounds.   Pulmonary/Chest: Breath sounds normal.  Abdominal: Soft. Bowel sounds are normal. She exhibits no distension.  Musculoskeletal: Normal range of motion. She exhibits no edema.  Neurological: She is alert and oriented to person, place, and time.       No sensory or motor deficit.  Skin: Skin is warm  and dry.  Psychiatric: She has a normal mood and affect. Her behavior is normal.    ED Course  Procedures (including critical care time)  1:30 PM  Date: 06/30/2011  Rate: 55  Rhythm: sinus bradycardia  QRS Axis: normal  Intervals: normal  ST/T Wave abnormalities: normal  Conduction Disutrbances:none  Narrative Interpretation: Sinus bradycardia, otherwise WNL.  Old EKG Reviewed: unchanged  3:33 PM Lab tests negative so far.  CBC, TNI, chest x-ray negative. She is resting, asymptomatic.    Results for orders placed during the hospital encounter of 06/30/11    CBC      Component Value Range   WBC 5.2  4.0 - 10.5 (K/uL)   RBC 3.50 (*) 3.87 - 5.11 (MIL/uL)   Hemoglobin 10.9 (*) 12.0 - 15.0 (g/dL)   HCT 11.9 (*) 14.7 - 46.0 (%)   MCV 96.0  78.0 - 100.0 (fL)   MCH 31.1  26.0 - 34.0 (pg)   MCHC 32.4  30.0 - 36.0 (g/dL)   RDW 82.9  56.2 - 13.0 (%)   Platelets 197  150 - 400 (K/uL)  DIFFERENTIAL      Component Value Range   Neutrophils Relative 66  43 - 77 (%)   Neutro Abs 3.5  1.7 - 7.7 (K/uL)   Lymphocytes Relative 24  12 - 46 (%)   Lymphs Abs 1.2  0.7 - 4.0 (K/uL)   Monocytes Relative 8  3 - 12 (%)   Monocytes Absolute 0.4  0.1 - 1.0 (K/uL)   Eosinophils Relative 2  0 - 5 (%)   Eosinophils Absolute 0.1  0.0 - 0.7 (K/uL)   Basophils Relative 0  0 - 1 (%)   Basophils Absolute 0.0  0.0 - 0.1 (K/uL)  COMPREHENSIVE METABOLIC PANEL      Component Value Range   Sodium 135  135 - 145 (mEq/L)   Potassium 3.4 (*) 3.5 - 5.1 (mEq/L)   Chloride 98  96 - 112 (mEq/L)   CO2 26  19 - 32 (mEq/L)   Glucose, Bld 97  70 - 99 (mg/dL)   BUN 15  6 - 23 (mg/dL)   Creatinine, Ser 8.65  0.50 - 1.10 (mg/dL)   Calcium 78.4  8.4 - 10.5 (mg/dL)   Total Protein 8.2  6.0 - 8.3 (g/dL)   Albumin 3.9  3.5 - 5.2 (g/dL)   AST 33  0 - 37 (U/L)   ALT 20  0 - 35 (U/L)   Alkaline Phosphatase 38 (*) 39 - 117 (U/L)   Total Bilirubin 0.3  0.3 - 1.2 (mg/dL)   GFR calc non Af Amer 79 (*) >90 (mL/min)   GFR calc Af Amer >90  >90 (mL/min)  URINALYSIS, ROUTINE W REFLEX MICROSCOPIC      Component Value Range   Color, Urine YELLOW  YELLOW    APPearance CLEAR  CLEAR    Specific Gravity, Urine 1.008  1.005 - 1.030    pH 7.5  5.0 - 8.0    Glucose, UA NEGATIVE  NEGATIVE (mg/dL)   Hgb urine dipstick NEGATIVE  NEGATIVE    Bilirubin Urine NEGATIVE  NEGATIVE    Ketones, ur NEGATIVE  NEGATIVE (mg/dL)   Protein, ur NEGATIVE  NEGATIVE (mg/dL)   Urobilinogen, UA 0.2  0.0 - 1.0 (mg/dL)   Nitrite NEGATIVE  NEGATIVE    Leukocytes, UA NEGATIVE  NEGATIVE   PROTIME-INR      Component  Value Range   Prothrombin Time 13.5  11.6 - 15.2 (seconds)  INR 1.01  0.00 - 1.49   APTT      Component Value Range   aPTT 29  24 - 37 (seconds)  POCT I-STAT TROPONIN I      Component Value Range   Troponin i, poc 0.00  0.00 - 0.08 (ng/mL)   Comment 3            Dg Chest 2 View  06/30/2011  *RADIOLOGY REPORT*  Clinical Data: [place]  The shortness of breath with chest pain  CHEST - 2 VIEW  Comparison: 10/29/2010  Findings: Hyperexpansion is consistent with emphysema.  The lungs are clear without focal infiltrate, edema, pneumothorax or pleural effusion. The cardiopericardial silhouette is within normal limits for size.  The patient is status post CABG Imaged bony structures of the thorax are intact.  IMPRESSION: Stable.  Emphysema without acute findings.  Original Report Authenticated By: ERIC A. MANSELL, M.D.    Lab workup was negative.  Pt was asymptomatic throughout the visit.  Doubt CAD as cause of her symptoms.  Reassured and released.  1. Chest pain             Carleene Cooper III, MD 07/01/11 1159

## 2011-06-30 NOTE — ED Notes (Signed)
Per EMS pt from [facility], reports onset of central chest pain at 1100. Pt took 3 nitroglycerin. No tightness or shortness of breath at this time. IV 20G LAC. Pain did not radiate, no diaphoresis, nausea/vomiting.

## 2011-07-01 ENCOUNTER — Encounter (HOSPITAL_COMMUNITY): Payer: Self-pay | Admitting: Emergency Medicine

## 2011-12-08 DIAGNOSIS — R809 Proteinuria, unspecified: Secondary | ICD-10-CM | POA: Insufficient documentation

## 2012-02-19 ENCOUNTER — Other Ambulatory Visit (HOSPITAL_BASED_OUTPATIENT_CLINIC_OR_DEPARTMENT_OTHER): Payer: Medicare Other

## 2012-02-19 DIAGNOSIS — D472 Monoclonal gammopathy: Secondary | ICD-10-CM

## 2012-02-19 LAB — COMPREHENSIVE METABOLIC PANEL (CC13)
ALT: 24 U/L (ref 0–55)
BUN: 19 mg/dL (ref 7.0–26.0)
CO2: 27 mEq/L (ref 22–29)
Creatinine: 1.1 mg/dL (ref 0.6–1.1)
Total Bilirubin: 0.3 mg/dL (ref 0.20–1.20)

## 2012-02-19 LAB — CBC WITH DIFFERENTIAL/PLATELET
BASO%: 0.8 % (ref 0.0–2.0)
EOS%: 3.1 % (ref 0.0–7.0)
HCT: 33 % — ABNORMAL LOW (ref 34.8–46.6)
LYMPH%: 32.2 % (ref 14.0–49.7)
MCH: 33.1 pg (ref 25.1–34.0)
MCHC: 33.8 g/dL (ref 31.5–36.0)
MONO#: 0.4 10*3/uL (ref 0.1–0.9)
NEUT%: 53.7 % (ref 38.4–76.8)
Platelets: 204 10*3/uL (ref 145–400)

## 2012-02-24 LAB — PROTEIN ELECTROPHORESIS, SERUM
Albumin ELP: 50.3 % — ABNORMAL LOW (ref 55.8–66.1)
Alpha-1-Globulin: 3.8 % (ref 2.9–4.9)
Alpha-2-Globulin: 8.6 % (ref 7.1–11.8)
Gamma Globulin: 4.6 % — ABNORMAL LOW (ref 11.1–18.8)

## 2012-02-24 LAB — BETA 2 MICROGLOBULIN, SERUM: Beta-2 Microglobulin: 1.56 mg/L (ref 1.01–1.73)

## 2012-02-26 ENCOUNTER — Ambulatory Visit (HOSPITAL_BASED_OUTPATIENT_CLINIC_OR_DEPARTMENT_OTHER): Payer: Medicare Other | Admitting: Oncology

## 2012-02-26 ENCOUNTER — Telehealth: Payer: Self-pay | Admitting: Oncology

## 2012-02-26 VITALS — BP 131/64 | HR 56 | Temp 97.1°F | Resp 18 | Ht 63.0 in | Wt 115.5 lb

## 2012-02-26 DIAGNOSIS — C9000 Multiple myeloma not having achieved remission: Secondary | ICD-10-CM

## 2012-02-26 DIAGNOSIS — D630 Anemia in neoplastic disease: Secondary | ICD-10-CM

## 2012-02-26 DIAGNOSIS — M549 Dorsalgia, unspecified: Secondary | ICD-10-CM

## 2012-02-26 DIAGNOSIS — M899 Disorder of bone, unspecified: Secondary | ICD-10-CM

## 2012-02-26 NOTE — Progress Notes (Signed)
   [facility]    OFFICE PROGRESS NOTE   INTERVAL HISTORY:   She returns as scheduled. No pain. No recent infection. Her only complaint is intermittent "panic "attacks.  Objective:  Vital signs in last 24 hours:  Blood pressure 131/64, pulse 56, temperature 97.1 F (36.2 C), temperature source Oral, resp. rate 18, height 5\' 3"  (1.6 m), weight 115 lb 8 oz (52.39 kg).    HEENT: Neck without mass Lymphatics: No cervical, supraclavicular, or axillary nodes Resp: Lungs clear bilaterally Cardio: Regular rate and rhythm GI: No hepatosplenomegaly Vascular: Trace edema at the right greater than left lower leg   Lab Results:  Lab Results  Component Value Date   WBC 4.2 02/19/2012   HGB 11.1* 02/19/2012   HCT 33.0* 02/19/2012   MCV 97.9 02/19/2012   PLT 204 02/19/2012   ANC 2.3 Creatinine 1.1, calcium 9.7, albumin 3.7, alkaline phosphatase 42, sodium 135  Beta-2 microglobulin 1.56, serum M spike 3.94 (serum M spike 1.99 on 02/13/2011) C.     Medications: I have reviewed the patient's current medications.  Assessment/Plan: 1. Indolent multiple myeloma, asymptomatic. No clinical evidence of disease progression, but the serum M spike is higher compared to 1 year ago. 2. Mild anemia secondary to multiple myeloma, stable. 3. History of mild neutropenia, likely related to multiple myeloma.  The neutrophil count remains in the normal range. 4. History of coronary artery disease. 5. Temporomandibular joint. 6. Osteopenia. 7. History of multiple urinary tract infections, followed by Dr. Retta Diones. 8. "Raynaud" syndrome. 9. Chronic back pain. 10. History of hyponatremia.   Disposition:  She appears stable. She remains asymptomatic from the myeloma. The serum M spike is higher compared to 1 year ago. She will return for a repeat laboratory evaluation in 3 months. She is scheduled for a 6 month office visit. [name] will be scheduled for a metastatic bone survey  within the next few weeks. She will remain up-to-date on the influenza and pneumococcal vaccines.   Thornton Papas, MD  02/26/2012  3:34 PM

## 2012-02-26 NOTE — Telephone Encounter (Signed)
Gave pt appt calendar for October, December and March 2014

## 2012-03-11 ENCOUNTER — Ambulatory Visit (HOSPITAL_COMMUNITY)
Admission: RE | Admit: 2012-03-11 | Discharge: 2012-03-11 | Disposition: A | Discharge status: Home / Self Care | Payer: Medicare Other | Source: Ambulatory Visit | Attending: Oncology | Admitting: Oncology

## 2012-03-11 DIAGNOSIS — C9000 Multiple myeloma not having achieved remission: Secondary | ICD-10-CM

## 2012-03-11 DIAGNOSIS — M47812 Spondylosis without myelopathy or radiculopathy, cervical region: Secondary | ICD-10-CM | POA: Insufficient documentation

## 2012-03-15 ENCOUNTER — Telehealth: Payer: Self-pay | Admitting: *Deleted

## 2012-03-15 NOTE — Telephone Encounter (Signed)
Called patient per Dr. Truett Perna; see notes below.

## 2012-03-15 NOTE — Telephone Encounter (Signed)
Message copied by Sherre Poot on Mon Mar 15, 2012  3:29 PM ------      Message from: Wandalee Ferdinand      Created: Mon Mar 15, 2012  2:40 PM                   ----- Message -----         From: Ladene Artist, MD         Sent: 03/14/2012  12:08 PM           To: Wandalee Ferdinand, RN, Glori Luis, RN, #            Please call patient, bone survey is negative, f/u as scheduled

## 2012-05-26 ENCOUNTER — Telehealth: Payer: Self-pay | Admitting: Oncology

## 2012-05-26 NOTE — Telephone Encounter (Signed)
Pt called and r/s lab appt for 12/19 to  12/27

## 2012-05-27 ENCOUNTER — Other Ambulatory Visit: Payer: Medicare Other | Admitting: Lab

## 2012-06-03 ENCOUNTER — Ambulatory Visit (HOSPITAL_BASED_OUTPATIENT_CLINIC_OR_DEPARTMENT_OTHER): Payer: Medicare Other | Admitting: Lab

## 2012-06-03 ENCOUNTER — Other Ambulatory Visit: Payer: Self-pay | Admitting: Oncology

## 2012-06-03 DIAGNOSIS — C9000 Multiple myeloma not having achieved remission: Secondary | ICD-10-CM

## 2012-06-03 LAB — CBC WITH DIFFERENTIAL/PLATELET
Basophils Absolute: 0 10*3/uL (ref 0.0–0.1)
Eosinophils Absolute: 0 10*3/uL (ref 0.0–0.5)
HCT: 34.2 % — ABNORMAL LOW (ref 34.8–46.6)
HGB: 11.6 g/dL (ref 11.6–15.9)
LYMPH%: 12.7 % — ABNORMAL LOW (ref 14.0–49.7)
MCHC: 34 g/dL (ref 31.5–36.0)
MONO#: 0.3 10*3/uL (ref 0.1–0.9)
NEUT#: 5.1 10*3/uL (ref 1.5–6.5)
NEUT%: 81 % — ABNORMAL HIGH (ref 38.4–76.8)
Platelets: 236 10*3/uL (ref 145–400)
WBC: 6.3 10*3/uL (ref 3.9–10.3)
lymph#: 0.8 10*3/uL — ABNORMAL LOW (ref 0.9–3.3)

## 2012-06-03 LAB — BASIC METABOLIC PANEL (CC13)
BUN: 21 mg/dL (ref 7.0–26.0)
CO2: 27 mEq/L (ref 22–29)
Calcium: 9.7 mg/dL (ref 8.4–10.4)
Chloride: 102 mEq/L (ref 98–107)
Creatinine: 1 mg/dL (ref 0.6–1.1)
Glucose: 100 mg/dl — ABNORMAL HIGH (ref 70–99)

## 2012-06-04 ENCOUNTER — Other Ambulatory Visit: Payer: Medicare Other

## 2012-06-07 LAB — PROTEIN ELECTROPHORESIS, SERUM
Beta 2: 29.3 % — ABNORMAL HIGH (ref 3.2–6.5)
Gamma Globulin: 3.8 % — ABNORMAL LOW (ref 11.1–18.8)
M-Spike, %: 2.17 g/dL

## 2012-06-07 LAB — KAPPA/LAMBDA LIGHT CHAINS: Kappa free light chain: 1.18 mg/dL (ref 0.33–1.94)

## 2012-06-08 ENCOUNTER — Encounter: Payer: Self-pay | Admitting: *Deleted

## 2012-06-09 HISTORY — PX: CORONARY ANGIOPLASTY WITH STENT PLACEMENT: SHX49

## 2012-06-23 ENCOUNTER — Encounter: Payer: Self-pay | Admitting: Oncology

## 2012-07-08 ENCOUNTER — Inpatient Hospital Stay (HOSPITAL_COMMUNITY)
Admission: AD | Admit: 2012-07-08 | Discharge: 2012-07-09 | DRG: 287 | Disposition: A | Discharge status: Home Health | Payer: Medicare Other | Source: Ambulatory Visit | Attending: Interventional Cardiology | Admitting: Interventional Cardiology

## 2012-07-08 ENCOUNTER — Encounter (HOSPITAL_COMMUNITY): Payer: Self-pay | Admitting: Family Medicine

## 2012-07-08 ENCOUNTER — Other Ambulatory Visit: Payer: Self-pay | Admitting: Cardiology

## 2012-07-08 DIAGNOSIS — I25810 Atherosclerosis of coronary artery bypass graft(s) without angina pectoris: Secondary | ICD-10-CM | POA: Diagnosis present

## 2012-07-08 DIAGNOSIS — R0789 Other chest pain: Secondary | ICD-10-CM

## 2012-07-08 DIAGNOSIS — I10 Essential (primary) hypertension: Secondary | ICD-10-CM | POA: Diagnosis present

## 2012-07-08 DIAGNOSIS — I2510 Atherosclerotic heart disease of native coronary artery without angina pectoris: Principal | ICD-10-CM | POA: Diagnosis present

## 2012-07-08 DIAGNOSIS — E785 Hyperlipidemia, unspecified: Secondary | ICD-10-CM | POA: Diagnosis present

## 2012-07-08 DIAGNOSIS — F411 Generalized anxiety disorder: Secondary | ICD-10-CM | POA: Diagnosis present

## 2012-07-08 DIAGNOSIS — I200 Unstable angina: Secondary | ICD-10-CM

## 2012-07-08 LAB — CBC WITH DIFFERENTIAL/PLATELET
Basophils Absolute: 0 10*3/uL (ref 0.0–0.1)
HCT: 33.6 % — ABNORMAL LOW (ref 36.0–46.0)
Hemoglobin: 11.4 g/dL — ABNORMAL LOW (ref 12.0–15.0)
Lymphocytes Relative: 16 % (ref 12–46)
Monocytes Absolute: 0.5 10*3/uL (ref 0.1–1.0)
Monocytes Relative: 10 % (ref 3–12)
Neutro Abs: 4 10*3/uL (ref 1.7–7.7)
Neutrophils Relative %: 72 % (ref 43–77)
RDW: 14.4 % (ref 11.5–15.5)
WBC: 5.5 10*3/uL (ref 4.0–10.5)

## 2012-07-08 LAB — COMPREHENSIVE METABOLIC PANEL
AST: 35 U/L (ref 0–37)
Alkaline Phosphatase: 40 U/L (ref 39–117)
CO2: 29 mEq/L (ref 19–32)
Chloride: 101 mEq/L (ref 96–112)
Creatinine, Ser: 0.78 mg/dL (ref 0.50–1.10)
GFR calc non Af Amer: 78 mL/min — ABNORMAL LOW (ref >90)
Potassium: 3.9 mEq/L (ref 3.5–5.1)
Total Bilirubin: 0.3 mg/dL (ref 0.3–1.2)

## 2012-07-08 LAB — TROPONIN I
Troponin I: 1.54 ng/mL (ref <0.30)
Troponin I: 1.54 ng/mL (ref <0.30)

## 2012-07-08 LAB — HEPARIN LEVEL (UNFRACTIONATED): Heparin Unfractionated: 0.99 IU/mL — ABNORMAL HIGH (ref 0.30–0.70)

## 2012-07-08 LAB — APTT: aPTT: 30 seconds (ref 24–37)

## 2012-07-08 MED ORDER — HEPARIN (PORCINE) IN NACL 100-0.45 UNIT/ML-% IJ SOLN
650.0000 [IU]/h | INTRAMUSCULAR | Status: DC
  Administered 2012-07-08 (×2): 800 [IU]/h via INTRAVENOUS
  Filled 2012-07-08: qty 250

## 2012-07-08 MED ORDER — SODIUM CHLORIDE 0.9 % IV SOLN
INTRAVENOUS | Status: DC
  Administered 2012-07-08 – 2012-07-09 (×2): 1000 mL via INTRAVENOUS

## 2012-07-08 MED ORDER — ATORVASTATIN CALCIUM 80 MG PO TABS
80.0000 mg | ORAL_TABLET | Freq: Every day | ORAL | Status: DC
  Administered 2012-07-08: 80 mg via ORAL
  Filled 2012-07-08 (×2): qty 1

## 2012-07-08 MED ORDER — LORAZEPAM 0.5 MG PO TABS
0.5000 mg | ORAL_TABLET | Freq: Four times a day (QID) | ORAL | Status: DC | PRN
  Administered 2012-07-08: 0.5 mg via ORAL
  Filled 2012-07-08: qty 1

## 2012-07-08 MED ORDER — ACETAMINOPHEN 325 MG PO TABS: 650.0000 mg | ORAL_TABLET | ORAL | Status: DC | PRN

## 2012-07-08 MED ORDER — METOPROLOL TARTRATE 25 MG PO TABS
25.0000 mg | ORAL_TABLET | Freq: Two times a day (BID) | ORAL | Status: DC
  Filled 2012-07-08 (×3): qty 1

## 2012-07-08 MED ORDER — SODIUM CHLORIDE 0.9 % IJ SOLN: 3.0000 mL | Freq: Two times a day (BID) | INTRAMUSCULAR | Status: DC

## 2012-07-08 MED ORDER — SODIUM CHLORIDE 0.9 % IV SOLN: 250.0000 mL | INTRAVENOUS | Status: DC | PRN

## 2012-07-08 MED ORDER — HEPARIN BOLUS VIA INFUSION
3000.0000 [IU] | Freq: Once | INTRAVENOUS | Status: AC
  Administered 2012-07-08: 3000 [IU] via INTRAVENOUS
  Filled 2012-07-08: qty 3000

## 2012-07-08 MED ORDER — SODIUM CHLORIDE 0.9 % IJ SOLN: 3.0000 mL | INTRAMUSCULAR | Status: DC | PRN

## 2012-07-08 MED ORDER — ASPIRIN 300 MG RE SUPP
300.0000 mg | RECTAL | Status: DC
  Filled 2012-07-08: qty 1

## 2012-07-08 MED ORDER — SERTRALINE HCL 100 MG PO TABS
100.0000 mg | ORAL_TABLET | Freq: Every day | ORAL | Status: DC
  Administered 2012-07-08: 100 mg via ORAL
  Filled 2012-07-08 (×2): qty 1

## 2012-07-08 MED ORDER — DIAZEPAM 5 MG PO TABS
5.0000 mg | ORAL_TABLET | ORAL | Status: AC
  Administered 2012-07-09: 5 mg via ORAL
  Filled 2012-07-08: qty 1

## 2012-07-08 MED ORDER — ASPIRIN 81 MG PO CHEW
324.0000 mg | CHEWABLE_TABLET | ORAL | Status: DC
  Filled 2012-07-08: qty 4

## 2012-07-08 MED ORDER — MIRABEGRON ER 50 MG PO TB24
50.0000 mg | ORAL_TABLET | Freq: Every day | ORAL | Status: DC
  Filled 2012-07-08 (×2): qty 1

## 2012-07-08 MED ORDER — ONDANSETRON HCL 4 MG/2ML IJ SOLN: 4.0000 mg | Freq: Four times a day (QID) | INTRAMUSCULAR | Status: DC | PRN

## 2012-07-08 MED ORDER — NITROGLYCERIN 2 % TD OINT
1.0000 [in_us] | TOPICAL_OINTMENT | Freq: Four times a day (QID) | TRANSDERMAL | Status: DC
  Administered 2012-07-08 – 2012-07-09 (×3): 1 [in_us] via TOPICAL
  Filled 2012-07-08: qty 30

## 2012-07-08 MED ORDER — PANTOPRAZOLE SODIUM 40 MG PO TBEC
40.0000 mg | DELAYED_RELEASE_TABLET | Freq: Every day | ORAL | Status: DC
  Administered 2012-07-08: 40 mg via ORAL
  Filled 2012-07-08: qty 1

## 2012-07-08 MED ORDER — NITROGLYCERIN 0.4 MG SL SUBL
0.4000 mg | SUBLINGUAL_TABLET | SUBLINGUAL | Status: DC | PRN
  Administered 2012-07-08 (×3): 0.4 mg via SUBLINGUAL
  Filled 2012-07-08: qty 25
  Filled 2012-07-08: qty 75

## 2012-07-08 MED ORDER — ISOSORBIDE MONONITRATE ER 30 MG PO TB24
30.0000 mg | ORAL_TABLET | Freq: Every day | ORAL | Status: DC
  Filled 2012-07-08 (×2): qty 1

## 2012-07-08 MED ORDER — ASPIRIN EC 81 MG PO TBEC
81.0000 mg | DELAYED_RELEASE_TABLET | Freq: Every day | ORAL | Status: DC
Start: 2012-07-09 — End: 2012-07-09
  Filled 2012-07-08: qty 1

## 2012-07-08 NOTE — Progress Notes (Signed)
ANTICOAGULATION CONSULT NOTE - Initial Consult  Pharmacy Consult for heparin Indication: chest pain/ACS  Allergies  Allergen Reactions  . Codeine Nausea Only  . Macrodantin Other (See Comments)    Double vision  . Morphine And Related Nausea And Vomiting  . Niacin And Related Nausea Only  . Simvastatin Other (See Comments)    unknown  . Ibuprofen Hives and Rash  . Penicillins Rash    Patient Measurements: Height: 5\' 3"  (160 cm) IBW/kg (Calculated) : 52.4  Heparin Dosing Weight: 52  Vital Signs: Temp: 97.4 F (36.3 C) (01/30 1323) Temp src: Oral (01/30 1323) BP: 138/69 mmHg (01/30 1323) Pulse Rate: 68  (01/30 1323)  Labs: No results found for this basename: HGB:2,HCT:3,PLT:3,APTT:3,LABPROT:3,INR:3,HEPARINUNFRC:3,CREATININE:3,CKTOTAL:3,CKMB:3,TROPONINI:3 in the last 72 hours  The CrCl is unknown because both a height and weight (above a minimum accepted value) are required for this calculation.   Medical History: Past Medical History  Diagnosis Date  . Hx of CABG   . Hypertension   . GERD (gastroesophageal reflux disease)   . Anxiety     Medications:  Scheduled:    . aspirin  324 mg Oral NOW   Or  . aspirin  300 mg Rectal NOW  . aspirin EC  81 mg Oral Daily  . atorvastatin  80 mg Oral q1800  . isosorbide mononitrate  30 mg Oral Daily  . metoprolol tartrate  25 mg Oral BID  . mirabegron ER  50 mg Oral Daily  . nitroGLYCERIN  1 inch Topical Q6H  . pantoprazole  40 mg Oral Daily  . sertraline  100 mg Oral Daily    Assessment: 79 yo with hx CAD who was admitted for CP. IV heparin has been ordered to r/o MI. She is not on anticoag PTA.   Goal of Therapy:  Heparin level 0.3-0.7 units/ml Monitor platelets by anticoagulation protocol: Yes   Plan:  Heparin bolus 4000 units x1 Heparin drip at 800 units/hr Check 8hr heparin level  Ulyses Southward [place] 07/08/2012,1:48 PM

## 2012-07-08 NOTE — Progress Notes (Signed)
ANTICOAGULATION CONSULT NOTE - Follow Up Consult  Pharmacy Consult for heparin Indication: chest pain/ACS  Labs:  Basename 07/08/12 2147 07/08/12 1847 07/08/12 1423 07/08/12 1421  HGB -- -- -- 11.4*  HCT -- -- -- 33.6*  PLT -- -- -- 249  APTT -- -- -- 30  LABPROT -- -- -- 13.6  INR -- -- -- 1.05  HEPARINUNFRC 0.99* -- -- --  CREATININE -- -- -- 0.78  CKTOTAL -- -- -- --  CKMB -- -- -- --  TROPONINI -- 1.54* 1.54* --     Assessment: 80yo female supratherapeutic on heparin with initial dosing for CP; given age/size/CrCl bolus may still be having some effect on level.  Goal of Therapy:  Heparin level 0.3-0.7 units/ml   Plan:  Will decrease heparin gtt by 3 units/kg/hr to 650 units/hr and check level in 8hr.  Colleen Can PharmD BCPS 07/08/2012,11:32 PM

## 2012-07-08 NOTE — Progress Notes (Signed)
Patient's troponin elevated at 1.54.  Results called to Dr. Katrinka Blazing.  No new orders received.  Will continue to monitor.  Colman Cater

## 2012-07-08 NOTE — Progress Notes (Signed)
Pt c/o sudden onset Left chest pain with SOB.  Denies radiation of pain, nausea, diaphoresis.  Treated with Ntg.4 x 3 with pain rated from 7/10 to 1/10 pressure pain. 12 lead EKG obtained NSR 76.  BP 130-160/60. Dr Leeann Must notified of same.  No new orders received.   WIll  Continue to monitor

## 2012-07-08 NOTE — H&P (Signed)
Patient: [name], [name] Provider: Verdis Prime, MD  DOB: 14-Nov-1931 Age: 79 Y Sex: Female Date: 07/08/2012  Phone: [phone]   Address: [address] [place] [address], [place], [phone]  Pcp: HAL STONEKING       Subjective:     CC:    1. NUCLEAR/1DSC/HS/414.00/786.50/V72.81/clearance for hammer toe surgery.        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 During the treadmill, she began to experience significant substernal chest discomfort and demonstrated diffuse ST segment downsloping depression in multiple leads with occasional ectopy/PVCs. She was given nitroglycerin x3. She was also given Bystolic 5mg  x1. She had not been on her beta blocker in preparation for the treadmill test. Her ST segment depression improved late in recovery. Imaging was performed which demonstrated very subtle change in the septal wall distribution but however no significant ischemia or high-risk ischemia noted. There was no evidence of transient ischemic dilatation. Her ejection fraction was 63%. After approximately a half hour after imaging, she continued to have 3-4/10 chest discomfort. She appeared comfortable however she was still concerned. I spoke to Dr. Katrinka Blazing on telephone. After discussing with the patient, we decided for observation in the hospital..        ROS:  No recent fevers, chills, bleeding, syncope, palpitations, orthopnea, all others negative. She has stated that she has had some GERD-like symptoms recently.       Medical History: CAD with CABG 2007, LIMA to LAD, SVG to RCA non-ST segment MI 08/2009-Dr. Katrinka Blazing , Hypertension, Hyperlipidemiagoal LDL less than 70, Raynaud's phenomenon, Panic disorder, Spinal stenosis, osteopenia 8 years Fosamax  discontinued 08/2009, multiple myeloma-Dr.Sherrill, Folstein minimental 11/2010 29/30, Gerd, Chronic mild anemia hemoglobin 11.3, Urine positive for microalbumin 12/2011, urology-Dr. Sherron Monday, joint pain Dr Darrick Penna.        Surgical History: tonsillectomy adenoidectomy , bilateral cataract extraction-Dr. Hazle Quant , cardiac catheter and stents .        Hospitalization/Major Diagnostic Procedure: see surgeries .        Family History: Father: deceased 48 yrs motor vehicle accident Mother: deceased 60 yrs myocardial infarction Sister 1: alive 60 yrs obesity Paternal aunt: Breast cancer  denies any GYN family cancer hx.       Social History:  General:  History of smoking cigarettes: Never smoked.  no Smoking.  Alcohol: wine, occasionally.  no Recreational drug use.  Exercise: 5 x week.  Occupation: unemployed, retired Automotive engineer.  Marital Status: single, widowed 10/2003 Mr. Lavon Paganini.  Children: 3 children.  3 children good health.       Medications: Fluticasone Furoate 27.5 MCG/SPRAY Suspension 2 puffs in each nostril as needed, MVI 1 tablet daily, MiraLax Powder 17 g once a day, Biotin 5000 Capsule 1 capsule Once a day, Gaviscon 95-358 MG/15ML Suspension 15 ml after meals and at bedtime as needed as needed, Sertraline HCl 100 MG Tablet 1 tablet Once a day, Metoprolol Tartrate 25 MG Tablet TAKE ONE TABLET TWICE DAILY , Imdur 30 MG Tablet Extended Release 24 Hour 1 tablet Once a day, Aspirin 325 MG Tablet Chewable 1 tablet Once a day, Nitroglycerin 0.4 MG Tablet Sublingual DISSOLVE 1 TABLET UNDER TONGUE EVERY 5 MINUTES FOR 3 DOSES AS NEEDED FOR CHEST PAIN , Myrbetriq 50 MG Tablet 1 tablet Once  daily, Lorazepam 0.25 MG Tablet 1 tablet Once a day prn, Co Q 10 100 MG Capsule 1 capsule with a meal Once a day, Estrace 0.1 MG/GM Cream 1 gram twice weekly x 6 weeks then prn, Ativan .5mg  Tablet 1/2 tablet as needed nightly, Crestor 40 Milligram Tablet TAKE ONE (1) TABLET EACH DAY , Calcium  Carbonate 600 MG Tablet 1 tablet twice a day, Aciphex 20 MG Tablet Delayed Release 1 tablet Once a day       Allergies: mycins, butazolidin, Cilostazol: stomach upset, Morphine Sulfate, Simvastatin, Penicillin (for allergy), Sulfa drugs (for allergy), Codeine (for allergy), Dexilant: diarrhea, Toviaz, VESIcare, Levaquin: dyspnea.       Objective:     Examination:  General Examination:  GENERAL APPEARANCE alert, oriented, NAD, pleasant.  SKIN: normal, no rash.  HEENT: normal.  HEAD: [place]/AT.  EYES: EOMI, Conjunctiva clear.  NECK: supple, FROM, without evidence of thyromegaly, adenopathy, or bruits, no jugular venous distention (JVD).  LUNGS: clear to auscultation bilaterally, no wheezes, rhonchi, rales, regular breathing rate and effort.  HEART: regular rate and rhythm, no S3, S4, murmur or rub, point of maximul impulse (PMI) normal.  ABDOMEN: soft, non-tender/non-distended, bowel sounds present, no masses palpated, no bruit.  EXTREMITIES: no clubbing, no edema, pulses 2 plus bilaterally.  NEUROLOGIC EXAM: non-focal exam, alert and oriented x 3.  PERIPHERAL PULSES: normal (2+) bilaterally.  LYMPH NODES: no cervical adenopathy.  PSYCH affect normal.  Prior cardiac catheterization, nuclear stress test, EKGs observed. Prior medical records reviewed.       Assessment:     Assessment:  1. Unstable angina - 411.1 (Primary)  2. Chest pain - 786.50  3. Coronary atherosclerosis of unspecified type of vessel, native or graft - 414.00  4. Hypercholesteremia, pure - 272.0  5. Hypertension, essential - 401.1, Blood pressure under good control continue current medication    Plan:     1. Unstable angina  We will go ahead and place on observation at 3 W. at [facility]. EMS will transport her. 3 nitroglycerin have been administered. I will place her on nitroglycerin patch. Continue to monitor. Check serial cardiac markers. Make her n.p.o. past midnight in case further cardiac workup  is necessary. Her nuclear images do not demonstrate any significant ischemia, although there are subtle changes in the septal wall pattern. Previously, atretic LIMA was noted on catheterization. Her ST segment depression however as well as chest discomfort were concerning. I will place on heparin IV.       2. Chest pain  Diagnostic Imaging:EC Stress Test [facility] CAD hx       As noted above she is prior history of coronary artery disease. She also does have some anxiety and history of panic disorder. Her hyperlipidemia has been well controlled with last LDL 67.

## 2012-07-09 ENCOUNTER — Encounter (HOSPITAL_COMMUNITY)
Admission: AD | Disposition: A | Discharge status: Home Health | Payer: Self-pay | Source: Ambulatory Visit | Attending: Interventional Cardiology

## 2012-07-09 HISTORY — PX: LEFT HEART CATHETERIZATION WITH CORONARY/GRAFT ANGIOGRAM: SHX5450

## 2012-07-09 LAB — BASIC METABOLIC PANEL
Calcium: 8.7 mg/dL (ref 8.4–10.5)
GFR calc Af Amer: 77 mL/min — ABNORMAL LOW (ref >90)
GFR calc non Af Amer: 67 mL/min — ABNORMAL LOW (ref >90)
Glucose, Bld: 99 mg/dL (ref 70–99)
Potassium: 3.3 mEq/L — ABNORMAL LOW (ref 3.5–5.1)
Sodium: 140 mEq/L (ref 135–145)

## 2012-07-09 LAB — LIPID PANEL
Cholesterol: 128 mg/dL (ref 0–200)
HDL: 73 mg/dL (ref >39)
LDL Cholesterol: 51 mg/dL (ref 0–99)
Triglycerides: 22 mg/dL (ref <150)

## 2012-07-09 LAB — TROPONIN I: Troponin I: 0.93 ng/mL (ref <0.30)

## 2012-07-09 LAB — HEMOGLOBIN A1C: Mean Plasma Glucose: 128 mg/dL — ABNORMAL HIGH (ref <117)

## 2012-07-09 SURGERY — LEFT HEART CATHETERIZATION WITH CORONARY/GRAFT ANGIOGRAM
Anesthesia: LOCAL

## 2012-07-09 MED ORDER — HYDRALAZINE HCL 20 MG/ML IJ SOLN
10.0000 mg | Freq: Once | INTRAMUSCULAR | Status: AC
  Administered 2012-07-09: 10 mg via INTRAVENOUS

## 2012-07-09 MED ORDER — NITROGLYCERIN 1 MG/10 ML FOR IR/CATH LAB
INTRA_ARTERIAL | Status: AC
  Filled 2012-07-09: qty 10

## 2012-07-09 MED ORDER — ASPIRIN 81 MG PO CHEW: 81.0000 mg | CHEWABLE_TABLET | Freq: Every day | ORAL | Status: DC

## 2012-07-09 MED ORDER — ACETAMINOPHEN 325 MG PO TABS: 650.0000 mg | ORAL_TABLET | ORAL | Status: DC | PRN

## 2012-07-09 MED ORDER — SODIUM CHLORIDE 0.9 % IV SOLN: INTRAVENOUS | Status: DC

## 2012-07-09 MED ORDER — HYDRALAZINE HCL 20 MG/ML IJ SOLN
INTRAMUSCULAR | Status: AC
  Filled 2012-07-09: qty 1

## 2012-07-09 MED ORDER — ASPIRIN 81 MG PO CHEW
CHEWABLE_TABLET | ORAL | Status: AC
  Filled 2012-07-09: qty 4

## 2012-07-09 MED ORDER — POTASSIUM CHLORIDE CRYS ER 20 MEQ PO TBCR
EXTENDED_RELEASE_TABLET | ORAL | Status: AC
  Administered 2012-07-09: 20 meq
  Filled 2012-07-09: qty 2

## 2012-07-09 MED ORDER — LIDOCAINE HCL (PF) 1 % IJ SOLN
INTRAMUSCULAR | Status: AC
  Filled 2012-07-09: qty 30

## 2012-07-09 MED ORDER — HEPARIN (PORCINE) IN NACL 2-0.9 UNIT/ML-% IJ SOLN
INTRAMUSCULAR | Status: AC
  Filled 2012-07-09: qty 2000

## 2012-07-09 MED ORDER — ONDANSETRON HCL 4 MG/2ML IJ SOLN: 4.0000 mg | Freq: Four times a day (QID) | INTRAMUSCULAR | Status: DC | PRN

## 2012-07-09 MED ORDER — MIDAZOLAM HCL 2 MG/2ML IJ SOLN
INTRAMUSCULAR | Status: AC
  Filled 2012-07-09: qty 2

## 2012-07-09 MED ORDER — FENTANYL CITRATE 0.05 MG/ML IJ SOLN
INTRAMUSCULAR | Status: AC
  Filled 2012-07-09: qty 2

## 2012-07-09 NOTE — Discharge Summary (Signed)
Patient ID: ASUNA PETH MRN: [id number] DOB/AGE: 1931/11/29 79 y.o.  Admit date: 07/08/2012 Discharge date: 07/09/2012   Patient Active Problem List  Diagnosis  . HYPERLIPIDEMIA  . HYPERTENSION  . CORONARY ATHEROSCLEROSIS, ARTERY BYPASS GRAFT  . RAYNAUDS SYNDROME  . HIP PAIN, RIGHT  . LUMBOSACRAL SPONDYLOSIS WITHOUT MYELOPATHY  . BURSITIS, LEFT SHOULDER  . GREATER TROCHANTERIC BURSITIS  . FOOT PAIN, BILATERAL  . CHEST PAIN, NON-CARDIAC  . SHOULDER PAIN, BILATERAL  . KNEE PAIN, BILATERAL  . ABNORMALITY OF GAIT  . Intermediate coronary syndrome    Primary Discharge Diagnosis : Acute coronary syndrome. Repeat cardiac catheterization revealed low risk anatomy Secondary Discharge Diagnosis: Coronary artery disease with prior coronary bypass grafting  Hypertension  Hyperlipidemia  Raynaud's phenomenon  Anxiety disorder and stress    Significant Diagnostic Studies: Diagnostic coronary angiography  Consults: None  Hospital Course:   This 79 year old patient is to undergo a podiatric procedure within the next several weeks. She's been having vague chest complaints. She saw a nurse practitioner and a nuclear study was recommended. On the day of admission she performed graded exercise stress testing with a myocardial perfusion study. She developed chest tightness that persisted for greater than an hour after exercise. Dr. Chales Abrahams felt that she needed to be admitted to the hospital. Her initial troponin was elevated at 0.15. She's had a similar presentation in 2011. Catheterization at that time did not reveal significant obstruction in the proximal vessels with threatened the patient's cardiac status. Under the circumstances we felt that repeat angiography was indicated as it could of been progression in native vessel or bypass graft disease over the past 3 years.  The catheterization demonstrated anatomy very similar to that in 2011. The LIMA is atretic. There is a stent in  the proximal LAD that is widely patent and has less than 50%.use ISR. The mid right coronary is totally occluded. The saphenous vein graft to the distal right coronary is widely patent. LV function is preserved.  Following the procedure which was completed by 10 AM the patient ambulated, was able to eat her lunch, and had no additional complaints. She was discharged from the hospital in improved condition with recommendations to remain relatively sedentary over the next 48 hours .   Discharge Exam: Blood pressure 133/61, pulse 61, temperature 98 F (36.7 C), temperature source Oral, resp. rate 18, height 5\' 3"  (1.6 m), weight 50.4 kg (111 lb 1.8 oz), SpO2 98.00%.   The right groin cath site is unremarkable the Labs:   Lab Results  Component Value Date   WBC 5.5 07/08/2012   HGB 11.4* 07/08/2012   HCT 33.6* 07/08/2012   MCV 94.9 07/08/2012   PLT 249 07/08/2012    Lab 07/09/12 0105 07/08/12 1421  NA 140 --  K 3.3* --  CL 105 --  CO2 28 --  BUN 13 --  CREATININE 0.82 --  CALCIUM 8.7 --  PROT -- 8.2  BILITOT -- 0.3  ALKPHOS -- 40  ALT -- 18  AST -- 35  GLUCOSE 99 --   Lab Results  Component Value Date   CKTOTAL 138 10/30/2010   CKMB 2.2 10/30/2010   TROPONINI 0.93* 07/09/2012    Lab Results  Component Value Date   CHOL 128 07/09/2012   CHOL  Value: 145        ATP III CLASSIFICATION:  <200     mg/dL   Desirable  409-811  mg/dL   Borderline High  >=914  mg/dL   High        4/78/2956   CHOL  Value: 133        ATP III CLASSIFICATION:  <200     mg/dL   Desirable  213-086  mg/dL   Borderline High  >=578    mg/dL   High        46/02/6294   Lab Results  Component Value Date   HDL 73 07/09/2012   HDL 72 2/84/1324   HDL 66 40/06/270   Lab Results  Component Value Date   LDLCALC 51 07/09/2012   LDLCALC  Value: 66        Total Cholesterol/HDL:CHD Risk Coronary Heart Disease Risk Table                     Men   Women  1/2 Average Risk   3.4   3.3  Average Risk       5.0   4.4  2 X  Average Risk   9.6   7.1  3 X Average Risk  23.4   11.0        Use the calculated Patient Ratio above and the CHD Risk Table to determine the patient's CHD Risk.        ATP III CLASSIFICATION (LDL):  <100     mg/dL   Optimal  536-644  mg/dL   Near or Above                    Optimal  130-159  mg/dL   Borderline  034-742  mg/dL   High  >595     mg/dL   Very High 6/38/7564   LDLCALC  Value: 60        Total Cholesterol/HDL:CHD Risk Coronary Heart Disease Risk Table                     Men   Women  1/2 Average Risk   3.4   3.3  Average Risk       5.0   4.4  2 X Average Risk   9.6   7.1  3 X Average Risk  23.4   11.0        Use the calculated Patient Ratio above and the CHD Risk Table to determine the patient's CHD Risk.        ATP III CLASSIFICATION (LDL):  <100     mg/dL   Optimal  332-951  mg/dL   Near or Above                    Optimal  130-159  mg/dL   Borderline  884-166  mg/dL   High  >063     mg/dL   Very High 06/15/107   Lab Results  Component Value Date   TRIG 22 07/09/2012   TRIG 33 10/01/2010   TRIG 37 03/12/2010   Lab Results  Component Value Date   CHOLHDL 1.8 07/09/2012   CHOLHDL 2.0 10/01/2010   CHOLHDL 2.0 03/12/2010   No results found for this basename: LDLDIRECT      Radiology: Unremarkable   EKG: No acute ST-T wave change  FOLLOW UP PLANS AND APPOINTMENTS Discharge Orders    Future Appointments: Provider: Department: Dept Phone: Center:   08/19/2012 2:00 PM Dava Najjar [facility] MEDICAL ONCOLOGY [phone] None   08/26/2012 3:45 PM Ladene Artist, MD [facility] MEDICAL ONCOLOGY [phone] None  Medication List     As of 07/09/2012  1:48 PM    TAKE these medications         ARTIFICIAL TEAR OP   Apply 1 drop to eye daily as needed. For dry eyes      aspirin 325 MG tablet   Take 325 mg by mouth daily.      Biotin 5000 MCG Caps   Take 5,000 mcg by mouth every evening.      CALCIUM 600 PO   Take 600 mg by mouth daily.        fluticasone 50 MCG/ACT nasal spray   Commonly known as: FLONASE   Place 2 sprays into the nose daily as needed. For allergies      isosorbide mononitrate 30 MG 24 hr tablet   Commonly known as: IMDUR   Take 30 mg by mouth daily.      LORazepam 0.5 MG tablet   Commonly known as: ATIVAN   Take 0.25 mg by mouth at bedtime.      metoprolol tartrate 25 MG tablet   Commonly known as: LOPRESSOR   Take 25 mg by mouth 2 (two) times daily.      multivitamin with minerals Tabs   Take 1 tablet by mouth daily.      MYRBETRIQ 50 MG Tb24   Generic drug: mirabegron ER   Take 50 mg by mouth daily.      polyethylene glycol packet   Commonly known as: MIRALAX / GLYCOLAX   Take 17 g by mouth at bedtime.      rosuvastatin 40 MG tablet   Commonly known as: CRESTOR   Take 40 mg by mouth every evening.      SALINE NASAL SPRAY NA   Place 3-4 sprays into the nose 2 (two) times daily.      sertraline 100 MG tablet   Commonly known as: ZOLOFT   Take 100 mg by mouth every morning.           Follow-up Information    Follow up with Lesleigh Noe, MD. (As needed or in 6 months )    Contact information:   [address] [place] [address] [phone]          BRING ALL MEDICATIONS WITH YOU TO FOLLOW UP APPOINTMENTS  Time spent with patient to include physician time: Less than 30 minutes   Signed: Lesleigh Noe 07/09/2012, 1:48 PM

## 2012-07-09 NOTE — CV Procedure (Signed)
     Diagnostic Cardiac Catheterization with Coronary Angiography and Bypass Graft Report  [name]  79 y.o.  female November 23, 1931  Procedure Date: 07/09/2012 Referring Physician: Merlene Laughter, M.D. Primary Cardiologist:: HWB Smith, III, MD   PROCEDURE:  Left heart catheterization with selective coronary angiography, left ventriculogram.  INDICATIONS:  Acute coronary syndrome  The risks, benefits, and details of the procedure were explained to the patient.  The patient verbalized understanding and wanted to proceed.  Informed written consent was obtained.  PROCEDURE TECHNIQUE:  After Xylocaine anesthesia a 5 French sheath was placed in the right femoral artery with a single anterior needle wall stick.   Coronary angiography was done using a 5 [place] MP A2 catheter.  Left ventriculography was done using the same catheter.    CONTRAST:  Total of 90 cc.  COMPLICATIONS:  None.    HEMODYNAMICS:  Aortic pressure was 176/70 mmHg; LV pressure was 176/11 mmHg; LVEDP 17 mm were.  There was no gradient between the left ventricle and aorta.    ANGIOGRAPHIC DATA:   The left main coronary artery is widely patent.  The left anterior descending artery is transapical. A proximal stent is noted after the first diagonal. There is generalized mild ISR within the stent with less than 30% narrowing. The first diagonal contains ostial 70% narrowing no significant changes noted in the appearance of the LAD when compared to the angiogram from 2011  The left circumflex artery is moderate in size and trifurcates on the lateral wall. There is proximal 30% narrowing..  The right coronary artery is 90% in the mid vessel proximal to a previously placed stent.  BYPASS GRAFT ANGIOGRAM:  SVG to RCA: Widely patent. Multiple luminal irregularities are noted in the proximal and mid graft. No significant obstruction is seen. The PDA and left ventricular branches are widely patent.  LIMA to LAD: Atretic,  demonstrated on prior angiograms.  LEFT VENTRICULOGRAM:  Left ventricular angiogram was done in the 30 RAO projection and revealed normal left ventricular wall motion and systolic function with an estimated ejection fraction of 60%.  LVEDP was  16 mmHg.  IMPRESSIONS:  1. Acute coronary syndrome with no obvious culprit.  2. Widely patent LAD and circumflex. The native RCA is totally occluded in the mid vessel.  3. Widely patent saphenous vein graft to the right coronary.  4. Previously demonstrated atretic LIMA to LAD.  5. Overall normal LV function with the mid anterior wall motion abnormality, unchanged. EF 60%   RECOMMENDATION:  Medical therapy. Potential discharge later today.Marland Kitchen

## 2012-07-09 NOTE — H&P (Signed)
NSTEMI based on labs and presentation. The nuclear study showed mild reduction in perfusion of the basal septum. Several sets of markers have been elevated. No recurrence of chest pain since admission. Plan diagnostic cath and possible PCI if evidence of significant obstruction with large territory threatened. This was discussed with the patient including the risks. She is reluctant to have restenting because stents failed leading to coronary bypass surgery in 2008.

## 2012-07-24 ENCOUNTER — Other Ambulatory Visit: Payer: Self-pay

## 2012-08-03 ENCOUNTER — Encounter (HOSPITAL_BASED_OUTPATIENT_CLINIC_OR_DEPARTMENT_OTHER): Payer: Self-pay | Admitting: *Deleted

## 2012-08-03 NOTE — Progress Notes (Signed)
Pt here for preop-no labs needed-had a cath and cardiac clearance 1/14 dr h Katrinka Blazing

## 2012-08-04 ENCOUNTER — Encounter (HOSPITAL_BASED_OUTPATIENT_CLINIC_OR_DEPARTMENT_OTHER)
Admission: RE | Disposition: A | Discharge status: Home / Self Care | Payer: Self-pay | Source: Ambulatory Visit | Attending: Orthopedic Surgery

## 2012-08-04 ENCOUNTER — Ambulatory Visit (HOSPITAL_BASED_OUTPATIENT_CLINIC_OR_DEPARTMENT_OTHER)
Admission: RE | Admit: 2012-08-04 | Discharge: 2012-08-04 | Disposition: A | Discharge status: Home / Self Care | Payer: Medicare Other | Source: Ambulatory Visit | Attending: Orthopedic Surgery | Admitting: Orthopedic Surgery

## 2012-08-04 ENCOUNTER — Ambulatory Visit (HOSPITAL_BASED_OUTPATIENT_CLINIC_OR_DEPARTMENT_OTHER): Payer: Medicare Other | Admitting: Anesthesiology

## 2012-08-04 ENCOUNTER — Encounter (HOSPITAL_BASED_OUTPATIENT_CLINIC_OR_DEPARTMENT_OTHER): Payer: Self-pay | Admitting: *Deleted

## 2012-08-04 ENCOUNTER — Encounter (HOSPITAL_BASED_OUTPATIENT_CLINIC_OR_DEPARTMENT_OTHER): Payer: Self-pay | Admitting: Anesthesiology

## 2012-08-04 DIAGNOSIS — I10 Essential (primary) hypertension: Secondary | ICD-10-CM | POA: Insufficient documentation

## 2012-08-04 DIAGNOSIS — M2040 Other hammer toe(s) (acquired), unspecified foot: Secondary | ICD-10-CM | POA: Insufficient documentation

## 2012-08-04 DIAGNOSIS — K219 Gastro-esophageal reflux disease without esophagitis: Secondary | ICD-10-CM | POA: Insufficient documentation

## 2012-08-04 HISTORY — PX: BUNIONECTOMY WITH HAMMERTOE RECONSTRUCTION: SHX5600

## 2012-08-04 HISTORY — DX: Unspecified osteoarthritis, unspecified site: M19.90

## 2012-08-04 HISTORY — PX: CAPSULOTOMY: SHX379

## 2012-08-04 SURGERY — CAPSULOTOMY
Anesthesia: General | Site: Toe | Laterality: Right | Wound class: Clean

## 2012-08-04 MED ORDER — PROPOFOL 10 MG/ML IV BOLUS
INTRAVENOUS | Status: DC | PRN
  Administered 2012-08-04: 120 mg via INTRAVENOUS
  Administered 2012-08-04: 30 mg via INTRAVENOUS

## 2012-08-04 MED ORDER — FENTANYL CITRATE 0.05 MG/ML IJ SOLN: 25.0000 ug | INTRAMUSCULAR | Status: DC | PRN

## 2012-08-04 MED ORDER — LIDOCAINE HCL (CARDIAC) 20 MG/ML IV SOLN
INTRAVENOUS | Status: DC | PRN
  Administered 2012-08-04: 60 mg via INTRAVENOUS

## 2012-08-04 MED ORDER — OXYCODONE HCL 5 MG PO TABS: 5.0000 mg | ORAL_TABLET | Freq: Once | ORAL | Status: DC | PRN

## 2012-08-04 MED ORDER — TRAMADOL HCL 50 MG PO TABS: 50.0000 mg | ORAL_TABLET | Freq: Four times a day (QID) | ORAL | Status: DC | PRN

## 2012-08-04 MED ORDER — DEXAMETHASONE SODIUM PHOSPHATE 4 MG/ML IJ SOLN
INTRAMUSCULAR | Status: DC | PRN
  Administered 2012-08-04: 5 mg via INTRAVENOUS

## 2012-08-04 MED ORDER — SODIUM CHLORIDE 0.9 % IV SOLN: INTRAVENOUS | Status: DC

## 2012-08-04 MED ORDER — OXYCODONE HCL 5 MG/5ML PO SOLN
5.0000 mg | Freq: Once | ORAL | Status: DC | PRN
Start: 2012-08-04 — End: 2012-08-04

## 2012-08-04 MED ORDER — ONDANSETRON HCL 4 MG/2ML IJ SOLN
INTRAMUSCULAR | Status: DC | PRN
  Administered 2012-08-04: 4 mg via INTRAVENOUS

## 2012-08-04 MED ORDER — CHLORHEXIDINE GLUCONATE 4 % EX LIQD: 60.0000 mL | Freq: Once | CUTANEOUS | Status: DC

## 2012-08-04 MED ORDER — ROPIVACAINE HCL 5 MG/ML IJ SOLN
INTRAMUSCULAR | Status: DC | PRN
  Administered 2012-08-04: 30 mL

## 2012-08-04 MED ORDER — EPHEDRINE SULFATE 50 MG/ML IJ SOLN
INTRAMUSCULAR | Status: DC | PRN
  Administered 2012-08-04: 10 mg via INTRAVENOUS

## 2012-08-04 MED ORDER — LACTATED RINGERS IV SOLN
INTRAVENOUS | Status: DC
  Administered 2012-08-04 (×2): via INTRAVENOUS

## 2012-08-04 MED ORDER — FENTANYL CITRATE 0.05 MG/ML IJ SOLN
50.0000 ug | INTRAMUSCULAR | Status: DC | PRN
  Administered 2012-08-04: 50 ug via INTRAVENOUS

## 2012-08-04 MED ORDER — MIDAZOLAM HCL 2 MG/2ML IJ SOLN
1.0000 mg | INTRAMUSCULAR | Status: DC | PRN
  Administered 2012-08-04: 1 mg via INTRAVENOUS

## 2012-08-04 MED ORDER — MEPERIDINE HCL 25 MG/ML IJ SOLN: 6.2500 mg | INTRAMUSCULAR | Status: DC | PRN

## 2012-08-04 SURGICAL SUPPLY — 71 items
APL SKNCLS STERI-STRIP NONHPOA (GAUZE/BANDAGES/DRESSINGS)
BANDAGE CONFORM 2  STR LF (GAUZE/BANDAGES/DRESSINGS) ×2 IMPLANT
BANDAGE ELASTIC 4 VELCRO ST LF (GAUZE/BANDAGES/DRESSINGS) ×1 IMPLANT
BENZOIN TINCTURE PRP APPL 2/3 (GAUZE/BANDAGES/DRESSINGS) IMPLANT
BIT DRILL SOLID 2.5X110 (BIT) ×1 IMPLANT
BLADE ARTHRO 4 MINI (BLADE) ×2 IMPLANT
BLADE ARTHRO LOK 4 BEAVER (BLADE) ×1 IMPLANT
BLADE OSC/SAG .038X5.5 CUT EDG (BLADE) ×2 IMPLANT
BLADE SURG 11 STRL SS (BLADE) ×1 IMPLANT
BLADE SURG 15 STRL LF DISP TIS (BLADE) ×4 IMPLANT
BLADE SURG 15 STRL SS (BLADE) ×6
BRUSH SCRUB EZ PLAIN DRY (MISCELLANEOUS) ×2 IMPLANT
CAP PIN ORTHO PINK (CAP) ×1 IMPLANT
CAP PIN PROTECTOR ORTHO WHT (CAP) IMPLANT
CLOTH BEACON ORANGE TIMEOUT ST (SAFETY) ×2 IMPLANT
COVER TABLE BACK 60X90 (DRAPES) ×2 IMPLANT
CUFF TOURNIQUET SINGLE 24IN (TOURNIQUET CUFF) ×1 IMPLANT
CUFF TOURNIQUET SINGLE 34IN LL (TOURNIQUET CUFF) ×1 IMPLANT
DRAPE EXTREMITY T 121X128X90 (DRAPE) ×2 IMPLANT
DRAPE OEC MINIVIEW 54X84 (DRAPES) ×2 IMPLANT
DRAPE SURG 17X23 STRL (DRAPES) ×2 IMPLANT
DRSG PAD ABDOMINAL 8X10 ST (GAUZE/BANDAGES/DRESSINGS) ×2 IMPLANT
DURA STEPPER LG (CAST SUPPLIES) IMPLANT
DURA STEPPER MED (CAST SUPPLIES) IMPLANT
DURA STEPPER SML (CAST SUPPLIES) IMPLANT
ELECT REM PT RETURN 9FT ADLT (ELECTROSURGICAL) ×2
ELECTRODE REM PT RTRN 9FT ADLT (ELECTROSURGICAL) ×1 IMPLANT
GAUZE SPONGE 4X4 16PLY XRAY LF (GAUZE/BANDAGES/DRESSINGS) IMPLANT
GAUZE XEROFORM 1X8 LF (GAUZE/BANDAGES/DRESSINGS) ×1 IMPLANT
GLOVE BIO SURGEON STRL SZ 6.5 (GLOVE) ×1 IMPLANT
GLOVE BIO SURGEON STRL SZ8 (GLOVE) ×2 IMPLANT
GLOVE BIOGEL PI IND STRL 7.0 (GLOVE) IMPLANT
GLOVE BIOGEL PI IND STRL 8 (GLOVE) ×2 IMPLANT
GLOVE BIOGEL PI INDICATOR 7.0 (GLOVE) ×1
GLOVE BIOGEL PI INDICATOR 8 (GLOVE) ×2
GLOVE EXAM NITRILE EXT CUFF MD (GLOVE) ×1 IMPLANT
GLOVE SURG SS PI 8.0 STRL IVOR (GLOVE) ×2 IMPLANT
GOWN BRE IMP PREV XXLGXLNG (GOWN DISPOSABLE) ×2 IMPLANT
GOWN PREVENTION PLUS XLARGE (GOWN DISPOSABLE) ×3 IMPLANT
K-WIRE .054X4 (WIRE) ×1 IMPLANT
KWIRE 4.0 X .045IN (WIRE) IMPLANT
KWIRE 4.0 X .062IN (WIRE) IMPLANT
NEEDLE HYPO 22GX1.5 SAFETY (NEEDLE) IMPLANT
NS IRRIG 1000ML POUR BTL (IV SOLUTION) ×2 IMPLANT
PACK BASIN DAY SURGERY FS (CUSTOM PROCEDURE TRAY) ×2 IMPLANT
PAD CAST 4YDX4 CTTN HI CHSV (CAST SUPPLIES) IMPLANT
PADDING CAST ABS 4INX4YD NS (CAST SUPPLIES) ×1
PADDING CAST ABS COTTON 4X4 ST (CAST SUPPLIES) ×1 IMPLANT
PADDING CAST COTTON 4X4 STRL (CAST SUPPLIES) ×2
PASSER SUT SWANSON 36MM LOOP (INSTRUMENTS) IMPLANT
PENCIL BUTTON HOLSTER BLD 10FT (ELECTRODE) ×2 IMPLANT
SHEET MEDIUM DRAPE 40X70 STRL (DRAPES) ×4 IMPLANT
SPONGE GAUZE 4X4 12PLY (GAUZE/BANDAGES/DRESSINGS) ×2 IMPLANT
STOCKINETTE 6  STRL (DRAPES) ×1
STOCKINETTE 6 STRL (DRAPES) ×1 IMPLANT
STRIP CLOSURE SKIN 1/2X4 (GAUZE/BANDAGES/DRESSINGS) IMPLANT
SUCTION FRAZIER TIP 10 FR DISP (SUCTIONS) ×1 IMPLANT
SUT ETHILON 4 0 PS 2 18 (SUTURE) ×5 IMPLANT
SUT MNCRL AB 4-0 PS2 18 (SUTURE) IMPLANT
SUT PDS AB 3-0 PS2 18 (SUTURE) ×2 IMPLANT
SUT PDS AB 4-0 P3 18 (SUTURE) ×1 IMPLANT
SUT VIC AB 2-0 PS2 27 (SUTURE) IMPLANT
SUT VIC AB 3-0 PS1 18 (SUTURE) ×4
SUT VIC AB 3-0 PS1 18XBRD (SUTURE) ×3 IMPLANT
SYR BULB 3OZ (MISCELLANEOUS) ×2 IMPLANT
SYR CONTROL 10ML LL (SYRINGE) IMPLANT
TOWEL OR 17X24 6PK STRL BLUE (TOWEL DISPOSABLE) ×5 IMPLANT
TOWEL OR NON WOVEN STRL DISP B (DISPOSABLE) ×2 IMPLANT
TUBE CONNECTING 20X1/4 (TUBING) ×4 IMPLANT
UNDERPAD 30X30 INCONTINENT (UNDERPADS AND DIAPERS) ×2 IMPLANT
WATER STERILE IRR 1000ML POUR (IV SOLUTION) ×1 IMPLANT

## 2012-08-04 NOTE — Progress Notes (Signed)
Assisted Dr. Massagee with right, ultrasound guided, popliteal/saphenous block. Side rails up, monitors on throughout procedure. See vital signs in flow sheet. Tolerated Procedure well. 

## 2012-08-04 NOTE — Brief Op Note (Signed)
08/04/2012  11:39 AM  PATIENT:  [name]  79 y.o. 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  PROCEDURE:  Procedure(s) with comments: CAPSULOTOMY (Right) - RIGHT 2ND TOE METATARSOPHALANGEAL JOINT DORSAL CAPSULOTOMY   HAMMERTOE RECONSTRUCTION (Right) - HAMMER TOE RECONSTRUCTION PROBABLE FLEXOR DIGITORUM LONGUS TO PROXIMAL PHALANX TRANSFER LATERALIZED   SURGEON:  Surgeon(s) and Role:    * Sherri Rad, MD - Primary  PHYSICIAN ASSISTANT: Rexene Edison, PAC   ASSISTANTS: Rexene Edison, [facility]    ANESTHESIA:   general  EBL:  Total I/O In: 200 [I.V.:200] Out: -   BLOOD ADMINISTERED:none  DRAINS: none   LOCAL MEDICATIONS USED:  NONE  SPECIMEN:  No Specimen  DISPOSITION OF SPECIMEN:  N/A  COUNTS:  YES  TOURNIQUET:   Total Tourniquet Time Documented: Thigh (Right) - 31 minutes Total: Thigh (Right) - 31 minutes   DICTATION: .Other Dictation: Dictation Number [id number]  PLAN OF CARE: Discharge to home after PACU  PATIENT DISPOSITION:  PACU - hemodynamically stable.   Delay start of Pharmacological VTE agent (>24hrs) due to surgical blood loss or risk of bleeding: no

## 2012-08-04 NOTE — Transfer of Care (Signed)
Immediate Anesthesia Transfer of Care Note  Patient: [name]  Procedure(s) Performed: Procedure(s) with comments: CAPSULOTOMY (Right) - RIGHT 2ND TOE METATARSOPHALANGEAL JOINT DORSAL CAPSULOTOMY   HAMMERTOE RECONSTRUCTION (Right) - HAMMER TOE RECONSTRUCTION PROBABLE FLEXOR DIGITORUM LONGUS TO PROXIMAL PHALANX TRANSFER LATERALIZED   Patient Location: PACU  Anesthesia Type:GA combined with regional for post-op pain  Level of Consciousness: sedated  Airway & Oxygen Therapy: Patient Spontanous Breathing and Patient connected to face mask oxygen  Post-op Assessment: Report given to PACU RN and Post -op Vital signs reviewed and stable  Post vital signs: Reviewed and stable  Complications: No apparent anesthesia complications

## 2012-08-04 NOTE — Anesthesia Preprocedure Evaluation (Signed)
Anesthesia Evaluation  Patient identified by MRN, date of birth, ID band Patient awake    Airway Mallampati: I      Dental  (+) Teeth Intact   Pulmonary neg pulmonary ROS,  breath sounds clear to auscultation        Cardiovascular hypertension, + angina + CAD Rhythm:Regular Rate:Normal     Neuro/Psych    GI/Hepatic Neg liver ROS, GERD-  ,  Endo/Other  negative endocrine ROS  Renal/GU negative Renal ROS     Musculoskeletal   Abdominal   Peds  Hematology negative hematology ROS (+)   Anesthesia Other Findings   Reproductive/Obstetrics                           Anesthesia Physical Anesthesia Plan  ASA: III  Anesthesia Plan: General   Post-op Pain Management:    Induction: Intravenous  Airway Management Planned: LMA  Additional Equipment:   Intra-op Plan:   Post-operative Plan: Extubation in OR  Informed Consent: I have reviewed the patients History and Physical, chart, labs and discussed the procedure including the risks, benefits and alternatives for the proposed anesthesia with the patient or authorized representative who has indicated his/her understanding and acceptance.   Dental advisory given  Plan Discussed with: CRNA and Surgeon  Anesthesia Plan Comments:         Anesthesia Quick Evaluation

## 2012-08-04 NOTE — Anesthesia Postprocedure Evaluation (Signed)
  Anesthesia Post-op Note  Patient: [name]  Procedure(s) Performed: Procedure(s) with comments: CAPSULOTOMY (Right) - RIGHT 2ND TOE METATARSOPHALANGEAL JOINT DORSAL CAPSULOTOMY   HAMMERTOE RECONSTRUCTION (Right) - HAMMER TOE RECONSTRUCTION PROBABLE FLEXOR DIGITORUM LONGUS TO PROXIMAL PHALANX TRANSFER LATERALIZED   Patient Location: PACU  Anesthesia Type:GA combined with regional for post-op pain  Level of Consciousness: awake  Airway and Oxygen Therapy: Patient Spontanous Breathing  Post-op Pain: mild  Post-op Assessment: Post-op Vital signs reviewed  Post-op Vital Signs: stable  Complications: No apparent anesthesia complications

## 2012-08-04 NOTE — H&P (Signed)
  H&P documentation: Placed to be scanned history and physical exam in chart.  -History and Physical Reviewed  -Patient has been re-examined  -No change in the plan of care  [name] A  

## 2012-08-04 NOTE — Anesthesia Procedure Notes (Addendum)
Anesthesia Regional Block:  Popliteal block  Pre-Anesthetic Checklist: ,, timeout performed, Correct Patient, Correct Site, Correct Laterality, Correct Procedure, Correct Position, site marked, Risks and benefits discussed,  Surgical consent,  Pre-op evaluation,  At surgeon's request and post-op pain management  Laterality: Right and Upper  Prep: chloraprep       Needles:   Needle Type: Echogenic Needle        Needle insertion depth: 4 cm   Additional Needles:  Procedures: ultrasound guided (picture in chart) Popliteal block Narrative:  Start time: 08/04/2012 9:25 AM End time: 08/04/2012 9:50 AM Injection made incrementally with aspirations every 5 mL.  Performed by: Personally  Anesthesiologist: TMassagee  Popliteal block Procedure Name: LMA Insertion Date/Time: 08/04/2012 10:27 AM Performed by: Burna Cash Pre-anesthesia Checklist: Patient identified, Emergency Drugs available, Suction available and Patient being monitored Patient Re-evaluated:Patient Re-evaluated prior to inductionOxygen Delivery Method: Circle System Utilized Preoxygenation: Pre-oxygenation with 100% oxygen Intubation Type: IV induction Ventilation: Mask ventilation without difficulty LMA: LMA inserted LMA Size: 4.0 Number of attempts: 1 Airway Equipment and Method: bite block Placement Confirmation: positive ETCO2 Tube secured with: Tape Dental Injury: Teeth and Oropharynx as per pre-operative assessment

## 2012-08-05 ENCOUNTER — Encounter (HOSPITAL_BASED_OUTPATIENT_CLINIC_OR_DEPARTMENT_OTHER): Payer: Self-pay | Admitting: Orthopedic Surgery

## 2012-08-05 NOTE — H&P (Deleted)
NAME:  [name], IGLESIA                   ACCOUNT NO.:  MEDICAL RECORD NO.:  [id number]  LOCATION:                                 FACILITY:  PHYSICIAN:  Leonides Grills, M.D.     DATE OF BIRTH:  1931-07-14  DATE OF ADMISSION: DATE OF DISCHARGE:                             HISTORY & PHYSICAL   PREOPERATIVE DIAGNOSIS:  Right crossover second hammertoe.  POSTOPERATIVE DIAGNOSIS:  Right crossover second hammertoe.  OPERATION: 1. Right second toe MTP joint dorsal capsulotomy with collateral     release. 2. Right hammertoe reconstruction. 3. Right FDL to proximal phalanx tendon transfer. 4. Right lateral collateral ligament imbrication of the right 2nd toe.  ANESTHESIA:  General.  SURGEON:  Leonides Grills, M.D.  ASSISTANT:  Richardean Canal, P.A.  ESTIMATED BLOOD LOSS:  Minimal.  TOURNIQUET TIME:  Approximately 40 minutes.  COMPLICATIONS:  None.  DISPOSITION:  Stable to PR.  INDICATION:  This is a 79 year old female who has had long-standing right second hammertoe pain that was interfering with her life to the point where she could not do what she wants to do despite conservative management.  She was consented for the above procedure.  All risks, infection, vessel injury, recurrence of the crossover toe deformity, fact that her would be stiff and longer, wound healing problems, possible cock-up toe deformity, new deformity, DVT, PE, and a possibility of amputating the toe at the MTP joint were all explained. Questions were encouraged and answered.  OPERATION:  The patient was brought to the operating room and placed in supine position after adequate general anesthesia was administered as well as vancomycin 1 g IV piggyback.  The right lower extremity was prepped and draped in sterile manner over proximally placed thigh tourniquet.  Limb was gravity exsanguinated.  Tourniquet was elevated to 290 mmHg after gravity exsanguination.  A longitudinal incision over the right 2nd toe  was then made, dissection was carried down through skin. Hemostasis was obtained.  EDB and EDL tendons were carefully dissected out.  EDL tendon was tenotomized proximally and medial and brevis distal lateral retracted out of harm's way throughout the case.  MTP joint dorsal capsulotomy with collateral release was then performed.  We then skeletonized distal aspect of the proximal phalanx head and the head was then removed with a rongeur followed by bone cutter.  This cut was made perpendicular to long axis of proximal phalanx.  A longitudinal incision in the plantar plate of the PIP joint was then made.  FDL tendon was then carefully dissected out, identified, and tenotomized as distal as possible.  We then placed a drill hole through the proximal phalanx using a 2.5 mm drill.  We then did a lateralizing transfer of the FDL to the proximal phalanx through the drill hole and reconstructed this and repaired with 4-0 PDS stitch.  This had an Conservation officer, historic buildings.  We then reconstructed the lateral capsule of the MTP joint of the right 2nd toe with 4-0 PDS stitch as well and again this had outstanding repair.  We then placed a 0.054 K-wire antegrade through middle distal phalanx, reduced the PIP joint and with the toe held in reduced  position, fired this across the MTP joint.  This held the toe in the desired position. We obtained a C-arm view to verify that this was in the proper position. Clinically it was in excellent alignment as well.  Prior to placing the K-wire, the toe stayed nicely in a straight position without any tendency to drift into varus, stayed well localized.  The EDB was then transferred to EDL dorsally using 4-0 PDS stitch as well as to the stump of the FDL tendon reconstructing the extension expansion unit. Tourniquet was deflated, hemostasis was obtained.  The toe pinked up nicely.  Skin relieving incisions were made on either side of the K- wire.  K-wire was bent, cut,  and capped.  The skin was closed with 4-0 nylon stitch.  Sterile dressing was applied.  Hard-sole shoe was applied.  The patient was stable to the PR.     Leonides Grills, M.D.     PB/MEDQ  D:  08/04/2012  T:  08/05/2012  Job:  [id number]

## 2012-08-11 ENCOUNTER — Other Ambulatory Visit: Payer: Self-pay | Admitting: Obstetrics and Gynecology

## 2012-08-11 ENCOUNTER — Other Ambulatory Visit (HOSPITAL_COMMUNITY)
Admission: RE | Admit: 2012-08-11 | Discharge: 2012-08-11 | Disposition: A | Discharge status: Home / Self Care | Payer: Medicare Other | Source: Ambulatory Visit | Attending: Obstetrics and Gynecology | Admitting: Obstetrics and Gynecology

## 2012-08-11 DIAGNOSIS — R87810 Cervical high risk human papillomavirus (HPV) DNA test positive: Secondary | ICD-10-CM | POA: Insufficient documentation

## 2012-08-11 DIAGNOSIS — Z1151 Encounter for screening for human papillomavirus (HPV): Secondary | ICD-10-CM | POA: Insufficient documentation

## 2012-08-11 DIAGNOSIS — Z01419 Encounter for gynecological examination (general) (routine) without abnormal findings: Secondary | ICD-10-CM | POA: Insufficient documentation

## 2012-08-12 ENCOUNTER — Telehealth: Payer: Self-pay | Admitting: Oncology

## 2012-08-12 NOTE — Telephone Encounter (Signed)
Talked to pt and gave her appt for 4/4/ lab and 09/13/12 r/s from 08/26/12 due to MD's PAL

## 2012-08-17 NOTE — Op Note (Signed)
NAMESHANEDRA, [name]              ACCOUNT NO.:  [id number]  MEDICAL RECORD NO.:  [id number]  LOCATION:                                 FACILITY:  PHYSICIAN:  Leonides Grills, M.D.     DATE OF BIRTH:  [birth year]  DATE OF PROCEDURE:  08/04/2012 DATE OF DISCHARGE:  08/04/2012                              OPERATIVE REPORT   PREOPERATIVE DIAGNOSIS:  Right crossover second hammertoe.  POSTOPERATIVE DIAGNOSIS:  Right crossover second hammertoe.  OPERATION: 1. Right second toe MTP joint dorsal capsulotomy with collateral     release. 2. Right hammertoe reconstruction. 3. Right FDL to proximal phalanx tendon transfer. 4. Right lateral collateral ligament imbrication of the right 2nd toe.  ANESTHESIA:  General.  SURGEON:  Leonides Grills, M.D.  ASSISTANT:  Richardean Canal, P.A.  ESTIMATED BLOOD LOSS:  Minimal.  TOURNIQUET TIME:  Approximately 40 minutes.  COMPLICATIONS:  None.  DISPOSITION:  Stable to PR.  INDICATION:  This is a 79 year old female who has had long-standing right second hammertoe pain that was interfering with her life to the point where she could not do what she wants to do despite conservative management.  She was consented for the above procedure.  All risks, infection, vessel injury, recurrence of the crossover toe deformity, fact that her would be stiff and longer, wound healing problems, possible cock-up toe deformity, new deformity, DVT, PE, and a possibility of amputating the toe at the MTP joint were all explained. Questions were encouraged and answered.  OPERATION:  The patient was brought to the operating room and placed in supine position after adequate general anesthesia was administered as well as vancomycin 1 g IV piggyback.  The right lower extremity was prepped and draped in sterile manner over proximally placed thigh tourniquet.  Limb was gravity exsanguinated.  Tourniquet was elevated to 290 mmHg after gravity exsanguination.  A longitudinal  incision over the right 2nd toe was then made, dissection was carried down through skin. Hemostasis was obtained.  EDB and EDL tendons were carefully dissected out.  EDL tendon was tenotomized proximally and medial and brevis distal lateral retracted out of harm'Tammy way throughout the case.  MTP joint dorsal capsulotomy with collateral release was then performed.  We then skeletonized distal aspect of the proximal phalanx head and the head was then removed with a rongeur followed by bone cutter.  This cut was made perpendicular to long axis of proximal phalanx.  A longitudinal incision in the plantar plate of the PIP joint was then made.  FDL tendon was then carefully dissected out, identified, and tenotomized as distal as possible.  We then placed a drill hole through the proximal phalanx using a 2.5 mm drill.  We then did a lateralizing transfer of the FDL to the proximal phalanx through the drill hole and reconstructed this and repaired with 4-0 PDS stitch.  This had an Conservation officer, historic buildings.  We then reconstructed the lateral capsule of the MTP joint of the right 2nd toe with 4-0 PDS stitch as well and again this had outstanding repair.  We then placed a 0.054 K-wire antegrade through middle distal phalanx, reduced the PIP joint and with the toe held in  reduced position, fired this across the MTP joint.  This held the toe in the desired position. We obtained a C-arm view to verify that this was in the proper position. Clinically it was in excellent alignment as well.  Prior to placing the K-wire, the toe stayed nicely in a straight position without any tendency to drift into varus, stayed well localized.  The EDB was then transferred to EDL dorsally using 4-0 PDS stitch as well as to the stump of the FDL tendon reconstructing the extension expansion unit. Tourniquet was deflated, hemostasis was obtained.  The toe pinked up nicely.  Skin relieving incisions were made on either side of the  K- wire.  K-wire was bent, cut, and capped.  The skin was closed with 4-0 nylon stitch.  Sterile dressing was applied.  Hard-sole shoe was applied.  The patient was stable to the PR.     Leonides Grills, M.D.     PB/MEDQ  D:  08/04/2012  T:  08/05/2012  Job:  [id number]

## 2012-08-19 ENCOUNTER — Other Ambulatory Visit: Payer: Medicare Other | Admitting: Lab

## 2012-08-26 ENCOUNTER — Ambulatory Visit: Payer: Medicare Other | Admitting: Oncology

## 2012-09-10 ENCOUNTER — Other Ambulatory Visit (HOSPITAL_BASED_OUTPATIENT_CLINIC_OR_DEPARTMENT_OTHER): Payer: Medicare Other | Admitting: Lab

## 2012-09-10 DIAGNOSIS — C9000 Multiple myeloma not having achieved remission: Secondary | ICD-10-CM

## 2012-09-10 LAB — CBC WITH DIFFERENTIAL/PLATELET
BASO%: 0.6 % (ref 0.0–2.0)
EOS%: 2 % (ref 0.0–7.0)
MCH: 32.2 pg (ref 25.1–34.0)
MCV: 95.8 fL (ref 79.5–101.0)
MONO%: 12.8 % (ref 0.0–14.0)
RBC: 3.32 10*6/uL — ABNORMAL LOW (ref 3.70–5.45)
RDW: 14.8 % — ABNORMAL HIGH (ref 11.2–14.5)
lymph#: 1.4 10*3/uL (ref 0.9–3.3)

## 2012-09-10 LAB — COMPREHENSIVE METABOLIC PANEL (CC13)
ALT: 14 U/L (ref 0–55)
AST: 27 U/L (ref 5–34)
Albumin: 3.3 g/dL — ABNORMAL LOW (ref 3.5–5.0)
Alkaline Phosphatase: 45 U/L (ref 40–150)
Chloride: 97 mEq/L — ABNORMAL LOW (ref 98–107)
Potassium: 4 mEq/L (ref 3.5–5.1)
Sodium: 137 mEq/L (ref 136–145)
Total Protein: 8.2 g/dL (ref 6.4–8.3)

## 2012-09-13 ENCOUNTER — Ambulatory Visit (HOSPITAL_BASED_OUTPATIENT_CLINIC_OR_DEPARTMENT_OTHER): Payer: Medicare Other | Admitting: Oncology

## 2012-09-13 ENCOUNTER — Telehealth: Payer: Self-pay | Admitting: Oncology

## 2012-09-13 VITALS — BP 138/67 | HR 61 | Temp 97.5°F | Resp 18 | Ht 63.0 in | Wt 111.1 lb

## 2012-09-13 DIAGNOSIS — D649 Anemia, unspecified: Secondary | ICD-10-CM

## 2012-09-13 DIAGNOSIS — M7989 Other specified soft tissue disorders: Secondary | ICD-10-CM

## 2012-09-13 DIAGNOSIS — G8929 Other chronic pain: Secondary | ICD-10-CM

## 2012-09-13 DIAGNOSIS — C9000 Multiple myeloma not having achieved remission: Secondary | ICD-10-CM

## 2012-09-13 NOTE — Progress Notes (Signed)
   [facility]    OFFICE PROGRESS NOTE   INTERVAL HISTORY:   She returns as scheduled. She underwent a "hammertoe "surgery in late February. A "pin "was placed and she remains in a boot. The right ankle and lower leg is slightly swollen since the time of surgery.  Mr. Blyden noted tenderness at the left upper anterior chest earlier today. No other pain or recent infection.  Objective:  Vital signs in last 24 hours:  Blood pressure 138/67, pulse 61, temperature 97.5 F (36.4 C), temperature source Oral, resp. rate 18, height 5\' 3"  (1.6 m), weight 111 lb 1.6 oz (50.395 kg).   Resp: Lungs clear bilaterally with bronchial sounds at the upper chest bilaterally, no respiratory distress Cardio: Regular rate and rhythm GI: No hepatosplenomegaly Vascular: ? Trace edema at the right lower leg. No erythema or tenderness.   Lab Results:  Lab Results  Component Value Date   WBC 6.3 09/10/2012   HGB 10.7* 09/10/2012   HCT 31.8* 09/10/2012   MCV 95.8 09/10/2012   PLT 190 09/10/2012   ANC 3.9 Creatinine 1.1, calcium 9.6, albumin 3.3, free lambda light chains 48.4  Serum M spike on 06/03/2012-2.17  Medications: I have reviewed the patient's current medications.  Assessment/Plan: 1. Indolent multiple myeloma, asymptomatic. No clinical evidence of disease progression 2. Mild anemia secondary to multiple myeloma, stable. A metastatic bone survey in October 2013 was negative for lytic lesions. The serum M spike was lower on 06/03/2012. 3. History of mild neutropenia, likely related to multiple myeloma. The neutrophil count remains in the normal range. 4. History of coronary artery disease. 5. Temporomandibular joint. 6. Osteopenia. 7. History of multiple urinary tract infections, followed by Dr. Retta Diones. 8. "Raynaud" syndrome. 9. Chronic back pain. 10. History of hyponatremia. 11. Right foot surgery February 2014   Disposition:  She has indolent multiple myeloma. There is  no clinical evidence of disease progression. We will followup on the serum protein electrophoresis from today. She will return for an office and lab visit in 6 months. We will initiate treatment for multiple myeloma if she develops progressive anemia, a rise in the serum creatinine, or lytic bone lesions. She plans to followup with orthopedics later this week. I have a low clinical suspicion for a DVT in the right leg. She will be sure the orthopedics physician is aware of the leg swelling.  Thornton Papas, MD  09/13/2012  4:06 PM

## 2012-09-14 LAB — PROTEIN ELECTROPHORESIS, SERUM
Alpha-1-Globulin: 4 % (ref 2.9–4.9)
Beta 2: 29.5 % — ABNORMAL HIGH (ref 3.2–6.5)
Gamma Globulin: 3.8 % — ABNORMAL LOW (ref 11.1–18.8)
M-Spike, %: 2.21 g/dL

## 2012-09-14 LAB — BETA 2 MICROGLOBULIN, SERUM: Beta-2 Microglobulin: 2.62 mg/L — ABNORMAL HIGH (ref 1.01–1.73)

## 2012-12-06 ENCOUNTER — Encounter: Payer: Self-pay | Admitting: Physician Assistant

## 2012-12-23 ENCOUNTER — Emergency Department (HOSPITAL_COMMUNITY): Payer: Medicare Other

## 2012-12-23 ENCOUNTER — Observation Stay (HOSPITAL_COMMUNITY)
Admission: EM | Admit: 2012-12-23 | Discharge: 2012-12-23 | Disposition: A | Discharge status: Home / Self Care | Payer: Medicare Other | Attending: Internal Medicine | Admitting: Internal Medicine

## 2012-12-23 ENCOUNTER — Encounter (HOSPITAL_COMMUNITY): Payer: Self-pay | Admitting: *Deleted

## 2012-12-23 DIAGNOSIS — Z79899 Other long term (current) drug therapy: Secondary | ICD-10-CM | POA: Insufficient documentation

## 2012-12-23 DIAGNOSIS — I2510 Atherosclerotic heart disease of native coronary artery without angina pectoris: Secondary | ICD-10-CM | POA: Insufficient documentation

## 2012-12-23 DIAGNOSIS — I10 Essential (primary) hypertension: Secondary | ICD-10-CM | POA: Insufficient documentation

## 2012-12-23 DIAGNOSIS — R0789 Other chest pain: Principal | ICD-10-CM | POA: Insufficient documentation

## 2012-12-23 DIAGNOSIS — Z951 Presence of aortocoronary bypass graft: Secondary | ICD-10-CM | POA: Insufficient documentation

## 2012-12-23 DIAGNOSIS — I25810 Atherosclerosis of coronary artery bypass graft(s) without angina pectoris: Secondary | ICD-10-CM | POA: Diagnosis present

## 2012-12-23 DIAGNOSIS — R079 Chest pain, unspecified: Secondary | ICD-10-CM | POA: Diagnosis present

## 2012-12-23 DIAGNOSIS — E785 Hyperlipidemia, unspecified: Secondary | ICD-10-CM | POA: Diagnosis present

## 2012-12-23 DIAGNOSIS — F411 Generalized anxiety disorder: Secondary | ICD-10-CM | POA: Insufficient documentation

## 2012-12-23 LAB — CBC WITH DIFFERENTIAL/PLATELET
Eosinophils Absolute: 0.1 10*3/uL (ref 0.0–0.7)
Eosinophils Relative: 3 % (ref 0–5)
Hemoglobin: 11.5 g/dL — ABNORMAL LOW (ref 12.0–15.0)
Lymphocytes Relative: 26 % (ref 12–46)
Lymphs Abs: 1.1 10*3/uL (ref 0.7–4.0)
MCH: 32.8 pg (ref 26.0–34.0)
MCV: 94.6 fL (ref 78.0–100.0)
Monocytes Relative: 8 % (ref 3–12)
Platelets: 236 10*3/uL (ref 150–400)
RBC: 3.51 MIL/uL — ABNORMAL LOW (ref 3.87–5.11)
WBC: 4.2 10*3/uL (ref 4.0–10.5)

## 2012-12-23 LAB — COMPREHENSIVE METABOLIC PANEL
AST: 56 U/L — ABNORMAL HIGH (ref 0–37)
Albumin: 3.5 g/dL (ref 3.5–5.2)
Alkaline Phosphatase: 44 U/L (ref 39–117)
BUN: 28 mg/dL — ABNORMAL HIGH (ref 6–23)
BUN: 23 mg/dL (ref 6–23)
Calcium: 9.6 mg/dL (ref 8.4–10.5)
Chloride: 98 mEq/L (ref 96–112)
Creatinine, Ser: 0.85 mg/dL (ref 0.50–1.10)
GFR calc Af Amer: 70 mL/min — ABNORMAL LOW (ref >90)
Glucose, Bld: 99 mg/dL (ref 70–99)
Potassium: 3.8 mEq/L (ref 3.5–5.1)
Total Bilirubin: 0.2 mg/dL — ABNORMAL LOW (ref 0.3–1.2)
Total Bilirubin: 0.2 mg/dL — ABNORMAL LOW (ref 0.3–1.2)
Total Protein: 7.9 g/dL (ref 6.0–8.3)
Total Protein: 8.1 g/dL (ref 6.0–8.3)

## 2012-12-23 LAB — POCT I-STAT, CHEM 8
Hemoglobin: 11.2 g/dL — ABNORMAL LOW (ref 12.0–15.0)
Sodium: 140 mEq/L (ref 135–145)
TCO2: 29 mmol/L (ref 0–100)

## 2012-12-23 LAB — CBC
HCT: 31.7 % — ABNORMAL LOW (ref 36.0–46.0)
Hemoglobin: 11.1 g/dL — ABNORMAL LOW (ref 12.0–15.0)
MCHC: 35 g/dL (ref 30.0–36.0)
MCV: 94.6 fL (ref 78.0–100.0)

## 2012-12-23 LAB — POCT I-STAT TROPONIN I: Troponin i, poc: 0.02 ng/mL (ref 0.00–0.08)

## 2012-12-23 LAB — TROPONIN I
Troponin I: <0.3 ng/mL (ref <0.30)
Troponin I: <0.3 ng/mL (ref <0.30)

## 2012-12-23 MED ORDER — ATORVASTATIN CALCIUM 80 MG PO TABS
80.0000 mg | ORAL_TABLET | Freq: Every day | ORAL | Status: DC
  Filled 2012-12-23: qty 1

## 2012-12-23 MED ORDER — SERTRALINE HCL 100 MG PO TABS
100.0000 mg | ORAL_TABLET | Freq: Every morning | ORAL | Status: DC
  Administered 2012-12-23: 100 mg via ORAL
  Filled 2012-12-23: qty 1

## 2012-12-23 MED ORDER — METOPROLOL TARTRATE 25 MG PO TABS
25.0000 mg | ORAL_TABLET | Freq: Two times a day (BID) | ORAL | Status: DC
  Administered 2012-12-23: 25 mg via ORAL
  Filled 2012-12-23 (×2): qty 1

## 2012-12-23 MED ORDER — ASPIRIN 325 MG PO TABS
325.0000 mg | ORAL_TABLET | Freq: Every day | ORAL | Status: DC
  Administered 2012-12-23: 325 mg via ORAL
  Filled 2012-12-23: qty 1

## 2012-12-23 MED ORDER — ACETAMINOPHEN 650 MG RE SUPP: 650.0000 mg | Freq: Four times a day (QID) | RECTAL | Status: DC | PRN

## 2012-12-23 MED ORDER — ISOSORBIDE MONONITRATE ER 30 MG PO TB24
30.0000 mg | ORAL_TABLET | Freq: Every day | ORAL | Status: DC
  Administered 2012-12-23: 30 mg via ORAL
  Filled 2012-12-23: qty 1

## 2012-12-23 MED ORDER — FLUTICASONE PROPIONATE 50 MCG/ACT NA SUSP
2.0000 | Freq: Every day | NASAL | Status: DC | PRN
  Filled 2012-12-23: qty 16

## 2012-12-23 MED ORDER — NITROGLYCERIN 0.4 MG SL SUBL: 0.4000 mg | SUBLINGUAL_TABLET | SUBLINGUAL | Status: DC | PRN

## 2012-12-23 MED ORDER — ASPIRIN EC 325 MG PO TBEC
325.0000 mg | DELAYED_RELEASE_TABLET | Freq: Every day | ORAL | Status: DC
  Filled 2012-12-23: qty 1

## 2012-12-23 MED ORDER — ENOXAPARIN SODIUM 40 MG/0.4ML ~~LOC~~ SOLN
40.0000 mg | SUBCUTANEOUS | Status: DC
  Administered 2012-12-23: 40 mg via SUBCUTANEOUS
  Filled 2012-12-23: qty 0.4

## 2012-12-23 MED ORDER — ADULT MULTIVITAMIN W/MINERALS CH
1.0000 | ORAL_TABLET | Freq: Every day | ORAL | Status: DC
  Administered 2012-12-23: 1 via ORAL
  Filled 2012-12-23: qty 1

## 2012-12-23 MED ORDER — ONDANSETRON HCL 4 MG/2ML IJ SOLN: 4.0000 mg | Freq: Four times a day (QID) | INTRAMUSCULAR | Status: DC | PRN

## 2012-12-23 MED ORDER — MIRABEGRON ER 50 MG PO TB24
50.0000 mg | ORAL_TABLET | Freq: Every day | ORAL | Status: DC
  Administered 2012-12-23: 50 mg via ORAL
  Filled 2012-12-23: qty 1

## 2012-12-23 MED ORDER — POLYETHYLENE GLYCOL 3350 17 G PO PACK
17.0000 g | PACK | Freq: Every day | ORAL | Status: DC
  Filled 2012-12-23: qty 1

## 2012-12-23 MED ORDER — ACETAMINOPHEN 325 MG PO TABS: 650.0000 mg | ORAL_TABLET | Freq: Four times a day (QID) | ORAL | Status: DC | PRN

## 2012-12-23 MED ORDER — LORAZEPAM 0.5 MG PO TABS: 0.2500 mg | ORAL_TABLET | Freq: Every day | ORAL | Status: DC

## 2012-12-23 MED ORDER — SODIUM CHLORIDE 0.9 % IJ SOLN: 3.0000 mL | Freq: Two times a day (BID) | INTRAMUSCULAR | Status: DC

## 2012-12-23 MED ORDER — ONDANSETRON HCL 4 MG PO TABS: 4.0000 mg | ORAL_TABLET | Freq: Four times a day (QID) | ORAL | Status: DC | PRN

## 2012-12-23 NOTE — ED Provider Notes (Signed)
History    CSN: [id number] Arrival date & time 12/23/12  0141  First MD Initiated Contact with Patient 12/23/12 [phone]     Chief Complaint  Patient presents with  . Chest Pain   (Consider location/radiation/quality/duration/timing/severity/associated sxs/prior Treatment) HPI History provided by patient. Chest pain onset tonight location across her chest. No associated dyspnea, diaphoresis or vomiting. Some nausea. Pain lasted about 15 minutes and resolved after taking nitroglycerin. Has remote history of CABG followed by cardiology Dr. Katrinka Blazing. Brought in by EMS. Pain free in the emergency department. No fevers. No cough. No leg pain or new swelling. She took aspirin prior to arrival. No abdominal pain. No reflux.  Past Medical History  Diagnosis Date  . Hx of CABG   . Hypertension   . GERD (gastroesophageal reflux disease)   . Anxiety   . Coronary artery disease   . Arthritis    Past Surgical History  Procedure Laterality Date  . Coronary artery bypass graft  2008  . Cardiac catheterization  2007,    2 stents  . Cardiac catheterization  2014  . Tonsillectomy    . Dilation and curettage of uterus    . Eye surgery      both cataracts  . Colonoscopy    . Upper gastrointestinal endoscopy    . Capsulotomy Right 08/04/2012    Procedure: CAPSULOTOMY;  Surgeon: Sherri Rad, MD;  Location: [facility];  Service: Orthopedics;  Laterality: Right;  RIGHT 2ND TOE METATARSOPHALANGEAL JOINT DORSAL CAPSULOTOMY   . Bunionectomy with hammertoe reconstruction Right 08/04/2012    Procedure:  HAMMERTOE RECONSTRUCTION;  Surgeon: Sherri Rad, MD;  Location: [facility];  Service: Orthopedics;  Laterality: Right;  HAMMER TOE RECONSTRUCTION PROBABLE FLEXOR DIGITORUM LONGUS TO PROXIMAL PHALANX TRANSFER LATERALIZED    History reviewed. No pertinent family history. History  Substance Use Topics  . Smoking status: Never Smoker   . Smokeless tobacco: Not on file  .  Alcohol Use: Yes     Comment: occ   OB History   Grav Para Term Preterm Abortions TAB SAB Ect Mult Living                 Review of Systems  Constitutional: Negative for fever and chills.  HENT: Negative for neck pain and neck stiffness.   Eyes: Negative for pain.  Respiratory: Negative for shortness of breath.   Cardiovascular: Positive for chest pain.  Gastrointestinal: Negative for abdominal pain.  Genitourinary: Negative for dysuria.  Musculoskeletal: Negative for back pain.  Skin: Negative for rash.  Neurological: Negative for headaches.  All other systems reviewed and are negative.    Allergies  Levaquin; Codeine; Erythromycin; Food; Macrodantin; Morphine and related; Simvastatin; Butazolidin; Ibuprofen; and Penicillins  Home Medications   Current Outpatient Rx  Name  Route  Sig  Dispense  Refill  . ARTIFICIAL TEAR OP   Ophthalmic   Apply 1 drop to eye daily as needed. For dry eyes         . aspirin 325 MG tablet   Oral   Take 325 mg by mouth daily.         . Biotin 5000 MCG CAPS   Oral   Take 5,000 mcg by mouth every evening.          . Calcium Carbonate (CALCIUM 600 PO)   Oral   Take 600 mg by mouth daily.         Marland Kitchen co-enzyme Q-10 30 MG capsule  Oral   Take 100 mg by mouth daily.         . fluticasone (FLONASE) 50 MCG/ACT nasal spray   Nasal   Place 2 sprays into the nose daily as needed. For allergies         . isosorbide mononitrate (IMDUR) 30 MG 24 hr tablet   Oral   Take 30 mg by mouth daily.         Marland Kitchen LORazepam (ATIVAN) 0.5 MG tablet   Oral   Take 0.25 mg by mouth at bedtime.          . metoprolol tartrate (LOPRESSOR) 25 MG tablet   Oral   Take 25 mg by mouth 2 (two) times daily.         . mirabegron ER (MYRBETRIQ) 50 MG TB24   Oral   Take 50 mg by mouth daily.         . Multiple Vitamin (MULITIVITAMIN WITH MINERALS) TABS   Oral   Take 1 tablet by mouth daily.         Marland Kitchen NITROSTAT 0.4 MG SL tablet    Sublingual   Place 0.4 mg under the tongue every 5 (five) minutes as needed for chest pain.          . polyethylene glycol (MIRALAX / GLYCOLAX) packet   Oral   Take 17 g by mouth at bedtime.         . rosuvastatin (CRESTOR) 40 MG tablet   Oral   Take 40 mg by mouth every evening.          Marland Kitchen SALINE NASAL SPRAY NA   Nasal   Place 3-4 sprays into the nose 2 (two) times daily as needed (for dryness).          . sertraline (ZOLOFT) 100 MG tablet   Oral   Take 100 mg by mouth every morning.           BP 156/66  Pulse 56  Temp(Src) 98.7 F (37.1 C) (Oral)  Resp 11  SpO2 96% Physical Exam  Constitutional: She is oriented to person, place, and time. She appears well-developed and well-nourished.  HENT:  Head: Normocephalic and atraumatic.  Eyes: EOM are normal. Pupils are equal, round, and reactive to light.  Neck: Neck supple.  Cardiovascular: Normal rate, regular rhythm and intact distal pulses.   Pulmonary/Chest: Effort normal and breath sounds normal. No respiratory distress.  Abdominal: Soft. She exhibits no distension. There is no tenderness.  Musculoskeletal: Normal range of motion. She exhibits no tenderness.  1-2+ symmetric peripheral edema LEs  Neurological: She is alert and oriented to person, place, and time.  Skin: Skin is warm and dry.    ED Course  Procedures (including critical care time) Results for orders placed during the hospital encounter of 12/23/12  CBC      Result Value Range   WBC 4.4  4.0 - 10.5 K/uL   RBC 3.35 (*) 3.87 - 5.11 MIL/uL   Hemoglobin 11.1 (*) 12.0 - 15.0 g/dL   HCT 16.1 (*) 09.6 - 04.5 %   MCV 94.6  78.0 - 100.0 fL   MCH 33.1  26.0 - 34.0 pg   MCHC 35.0  30.0 - 36.0 g/dL   RDW 40.9  81.1 - 91.4 %   Platelets 224  150 - 400 K/uL  COMPREHENSIVE METABOLIC PANEL      Result Value Range   Sodium 137  135 - 145 mEq/L   Potassium 3.8  3.5 - 5.1 mEq/L   Chloride 98  96 - 112 mEq/L   CO2 31  19 - 32 mEq/L   Glucose, Bld 99   70 - 99 mg/dL   BUN 28 (*) 6 - 23 mg/dL   Creatinine, Ser 1.61  0.50 - 1.10 mg/dL   Calcium 9.9  8.4 - 09.6 mg/dL   Total Protein 7.9  6.0 - 8.3 g/dL   Albumin 3.3 (*) 3.5 - 5.2 g/dL   AST 65 (*) 0 - 37 U/L   ALT 38 (*) 0 - 35 U/L   Alkaline Phosphatase 44  39 - 117 U/L   Total Bilirubin 0.2 (*) 0.3 - 1.2 mg/dL   GFR calc non Af Amer 60 (*) >90 mL/min   GFR calc Af Amer 70 (*) >90 mL/min  POCT I-STAT, CHEM 8      Result Value Range   Sodium 140  135 - 145 mEq/L   Potassium 4.0  3.5 - 5.1 mEq/L   Chloride 100  96 - 112 mEq/L   BUN 30 (*) 6 - 23 mg/dL   Creatinine, Ser 0.45  0.50 - 1.10 mg/dL   Glucose, Bld 409 (*) 70 - 99 mg/dL   Calcium, Ion 8.11  9.14 - 1.30 mmol/L   TCO2 29  0 - 100 mmol/L   Hemoglobin 11.2 (*) 12.0 - 15.0 g/dL   HCT 78.2 (*) 95.6 - 21.3 %  POCT I-STAT TROPONIN I      Result Value Range   Troponin i, poc 0.02  0.00 - 0.08 ng/mL   Comment 3            Dg Chest Portable 1 View  12/23/2012   *RADIOLOGY REPORT*  Clinical Data: Chest pain.  PORTABLE CHEST - 1 VIEW  Comparison: Chest radiograph performed 06/30/2011, and CT of the chest performed 09/19/2009  Findings: The lungs are well-aerated.  There is no evidence of focal opacification, pleural effusion or pneumothorax.  Known pulmonary nodules are less well as on radiograph.  The cardiomediastinal silhouette is normal in size; the patient is status post median sternotomy, with evidence of prior CABG.  No acute osseous abnormalities are seen.  IMPRESSION: No acute cardiopulmonary process seen.   Original Report Authenticated By: Tonia Ghent, M.D.     Date: 12/23/2012  Rate: 59  Rhythm: normal sinus rhythm  QRS Axis: normal  Intervals: normal  ST/T Wave abnormalities: nonspecific ST changes  Conduction Disutrbances:none  Narrative Interpretation:   Old EKG Reviewed: unchanged  ASA and NTG PTA  4:13 AM patient remains pain-free in the emergency department. Case discussed as above with Dr. Toniann Fail - will  admit  MDM  Chest pain history of CABG  EKG. Labs. Chest x-ray.  Asymptomatic in the emergency department.  Admit for rule out   Sunnie Nielsen, MD 12/23/12 [phone]

## 2012-12-23 NOTE — ED Notes (Signed)
Pt reports having 3 episodes of chest pain tonight. Pt states she took two asa and three nitro before being picked up by EMS

## 2012-12-23 NOTE — H&P (Signed)
[facility] History and Physical  [name]:[id number] DOB: [birth year] DOA: 12/23/2012  Referring physician: ER physician. PCP: Ginette Otto, MD  Specialists: Dr. Verdis Prime. Cardiologist.  Chief Complaint: Chest pain.  HPI: [name] is a 79 y.o. female with known history of CAD status post CABG who has had a cardiac catheter this January and was advised medical management started experiencing chest pain last night after supper when patient was on her way to a concert. Later during the consult patient also had chest pain which last a few minutes. Patient states he had forgotten to take nitroglycerin to the concert. Later after reaching home and on the bed she woke up with chest pain. At that time she took some nitroglycerin and EMS was called and couple more nitroglycerin sublingual was given and patient felt better and presently chest pain-free. Patient denies any associated shortness of breath diaphoresis nausea vomiting. In the ER chest x-ray cardiac enzymes have been negative EKG shows T-wave inversion in V1 V2. Patient has been admitted for further management.  Review of Systems: As presented in the history of presenting illness, rest negative.  Past Medical History  Diagnosis Date  . Hx of CABG   . Hypertension   . GERD (gastroesophageal reflux disease)   . Anxiety   . Coronary artery disease   . Arthritis    Past Surgical History  Procedure Laterality Date  . Coronary artery bypass graft  2008  . Cardiac catheterization  2007,    2 stents  . Cardiac catheterization  2014  . Tonsillectomy    . Dilation and curettage of uterus    . Eye surgery      both cataracts  . Colonoscopy    . Upper gastrointestinal endoscopy    . Capsulotomy Right 08/04/2012    Procedure: CAPSULOTOMY;  Surgeon: Sherri Rad, MD;  Location: [facility];  Service: Orthopedics;  Laterality: Right;  RIGHT 2ND TOE METATARSOPHALANGEAL JOINT DORSAL CAPSULOTOMY    . Bunionectomy with hammertoe reconstruction Right 08/04/2012    Procedure:  HAMMERTOE RECONSTRUCTION;  Surgeon: Sherri Rad, MD;  Location: [facility];  Service: Orthopedics;  Laterality: Right;  HAMMER TOE RECONSTRUCTION PROBABLE FLEXOR DIGITORUM LONGUS TO PROXIMAL PHALANX TRANSFER LATERALIZED    Social History:  reports that she has never smoked. She does not have any smokeless tobacco history on file. She reports that  drinks alcohol. She reports that she does not use illicit drugs. Assisted living facility. where does patient live-- Can do ADLs. Can patient participate in ADLs?  Allergies  Allergen Reactions  . Levaquin (Levofloxacin) Shortness Of Breath  . Codeine Nausea Only  . Erythromycin Nausea And Vomiting    All "mycin" drugs  . Food Other (See Comments)    No spicy foods or black pepper, raw onions - causes gerd  . Macrodantin Other (See Comments)    Double vision  . Morphine And Related Other (See Comments)    hallucinations  . Simvastatin Other (See Comments)    Muscle aches  . Butazolidin (Phenylbutazone) Swelling and Rash  . Ibuprofen Hives and Rash  . Penicillins Rash    History reviewed. No pertinent family history.    Prior to Admission medications   Medication Sig Start Date End Date Taking? Authorizing Provider  ARTIFICIAL TEAR OP Apply 1 drop to eye daily as needed. For dry eyes   Yes Historical Provider, MD  aspirin 325 MG tablet Take 325 mg by mouth daily.  Yes Historical Provider, MD  Biotin 5000 MCG CAPS Take 5,000 mcg by mouth every evening.    Yes Historical Provider, MD  Calcium Carbonate (CALCIUM 600 PO) Take 600 mg by mouth daily.   Yes Historical Provider, MD  co-enzyme Q-10 30 MG capsule Take 100 mg by mouth daily.   Yes Historical Provider, MD  fluticasone (FLONASE) 50 MCG/ACT nasal spray Place 2 sprays into the nose daily as needed. For allergies   Yes Historical Provider, MD  isosorbide mononitrate (IMDUR) 30 MG 24 hr  tablet Take 30 mg by mouth daily.   Yes Historical Provider, MD  LORazepam (ATIVAN) 0.5 MG tablet Take 0.25 mg by mouth at bedtime.    Yes Historical Provider, MD  metoprolol tartrate (LOPRESSOR) 25 MG tablet Take 25 mg by mouth 2 (two) times daily.   Yes Historical Provider, MD  mirabegron ER (MYRBETRIQ) 50 MG TB24 Take 50 mg by mouth daily.   Yes Historical Provider, MD  Multiple Vitamin (MULITIVITAMIN WITH MINERALS) TABS Take 1 tablet by mouth daily.   Yes Historical Provider, MD  NITROSTAT 0.4 MG SL tablet Place 0.4 mg under the tongue every 5 (five) minutes as needed for chest pain.  07/13/12  Yes Historical Provider, MD  polyethylene glycol (MIRALAX / GLYCOLAX) packet Take 17 g by mouth at bedtime.   Yes Historical Provider, MD  rosuvastatin (CRESTOR) 40 MG tablet Take 40 mg by mouth every evening.    Yes Historical Provider, MD  SALINE NASAL SPRAY NA Place 3-4 sprays into the nose 2 (two) times daily as needed (for dryness).    Yes Historical Provider, MD  sertraline (ZOLOFT) 100 MG tablet Take 100 mg by mouth every morning.    Yes Historical Provider, MD   Physical Exam: Filed Vitals:   12/23/12 0330 12/23/12 0400 12/23/12 0430 12/23/12 0455  BP: 156/66 179/73 146/60 145/55  Pulse: 56 58 54 59  Temp:    97.9 F (36.6 C)  TempSrc:    Oral  Resp: 11 14 14 18   Height:    5\' 3"  (1.6 m)  Weight:    48.3 kg (106 lb 7.7 oz)  SpO2: 96% 98% 97% 100%     General:  Well-developed well-nourished.  Eyes: Anicteric no pallor.  ENT: No discharge from ears eyes nose mouth.  Neck: No mass felt.  Cardiovascular: S1-S2 heard.  Respiratory: No rhonchi or crepitations.  Abdomen: Soft nontender bowel sounds present.  Skin: No rash.  Musculoskeletal: No edema.  Psychiatric: Appears normal.  Neurologic: Alert and oriented to time place and person. Moves all extremities.  Labs on Admission:  Basic Metabolic Panel:  Recent Labs Lab 12/23/12 0209 12/23/12 0218  NA 137 140  K 3.8  4.0  CL 98 100  CO2 31  --   GLUCOSE 99 100*  BUN 28* 30*  CREATININE 0.89 1.00  CALCIUM 9.9  --    Liver Function Tests:  Recent Labs Lab 12/23/12 0209  AST 65*  ALT 38*  ALKPHOS 44  BILITOT 0.2*  PROT 7.9  ALBUMIN 3.3*   No results found for this basename: LIPASE, AMYLASE,  in the last 168 hours No results found for this basename: AMMONIA,  in the last 168 hours CBC:  Recent Labs Lab 12/23/12 0209 12/23/12 0218  WBC 4.4  --   HGB 11.1* 11.2*  HCT 31.7* 33.0*  MCV 94.6  --   PLT 224  --    Cardiac Enzymes: No results found for this basename: CKTOTAL,  CKMB, CKMBINDEX, TROPONINI,  in the last 168 hours  BNP (last 3 results) No results found for this basename: PROBNP,  in the last 8760 hours CBG: No results found for this basename: GLUCAP,  in the last 168 hours  Radiological Exams on Admission: Dg Chest Portable 1 View  12/23/2012   *RADIOLOGY REPORT*  Clinical Data: Chest pain.  PORTABLE CHEST - 1 VIEW  Comparison: Chest radiograph performed 06/30/2011, and CT of the chest performed 09/19/2009  Findings: The lungs are well-aerated.  There is no evidence of focal opacification, pleural effusion or pneumothorax.  Known pulmonary nodules are less well as on radiograph.  The cardiomediastinal silhouette is normal in size; the patient is status post median sternotomy, with evidence of prior CABG.  No acute osseous abnormalities are seen.  IMPRESSION: No acute cardiopulmonary process seen.   Original Report Authenticated By: Tonia Ghent, M.D.    EKG: Independently reviewed. Normal sinus rhythm with T-wave inversion in V1 V2.  Assessment/Plan Principal Problem:   Chest pain Active Problems:   HYPERLIPIDEMIA   HYPERTENSION   CORONARY ATHEROSCLEROSIS, ARTERY BYPASS GRAFT   1. Chest pain with history of CAD status post CABG - pressing chest pain-free. Cycle cardiac markers. Continue home medications. Aspirin. When necessary nitroglycerin. May discuss with patient's  cardiologist Dr. Katrinka Blazing in a.m. 2. Hypertension - continue present medications. 3. Hyperlipidemia - continue present medications.    Code Status: Full code.  Family Communication: None.  Disposition Plan: Admit for observation.    Dmario Russom N. [facility] Pager [phone].  If 7PM-7AM, please contact night-coverage [email or web address] Password [facility] 12/23/2012, 5:17 AM

## 2012-12-23 NOTE — Discharge Summary (Signed)
Physician Discharge Summary  [name] XBJ:[id number] DOB: [birth year] DOA: 12/23/2012  PCP: Ginette Otto, MD  Admit date: 12/23/2012 Discharge date: 12/23/2012  Time spent: 40 minutes  Recommendations for Outpatient Follow-up:  1. Dr.Smith in 2 weeks  Discharge Diagnoses:  Principal Problem:   Chest pain Active Problems:   HYPERLIPIDEMIA   HYPERTENSION   CORONARY ATHEROSCLEROSIS, ARTERY BYPASS GRAFT   Discharge Condition: stable  Diet recommendation: heart healthy  Filed Weights   12/23/12 0455  Weight: 48.3 kg (106 lb 7.7 oz)    History of present illness:  Chief Complaint: Chest pain.  HPI: [name] is a 79 y.o. female with known history of CAD status post CABG who has had a cardiac catheter this January and was advised medical management started experiencing chest pain last night after supper when patient was on her way to a concert. Later during the consult patient also had chest pain which last a few minutes. Patient states he had forgotten to take nitroglycerin to the concert. Later after reaching home and on the bed she woke up with chest pain. At that time she took some nitroglycerin and EMS was called and couple more nitroglycerin sublingual was given and patient felt better and presently chest pain-free. Patient denies any associated shortness of breath diaphoresis nausea vomiting.    Hospital Course:  81/F with CAD s/p CABG 2007  Admitted following 3 episodes of chest pain last night, total of 5 episodes of chest pain this week.  EKG unchanged from prior and cardiac enz x1 negative  Recent Cath 1/14 with Widely patent LAD and circumflex, native RCA is totally occluded in the mid vessel, Widely patent saphenous vein graft to the right coronary, Previously demonstrated atretic LIMA to LAD and Normal LV function with the mid anterior wall motion abnormality, unchanged. EF 60%  Was seen by Dr.Smith in consultation. Since serial markers and EKG  remained stable and hence no further workup was felt to be required, He felt that her symptoms were likely non cardiac.   Procedures:  ECHO  Consultations:  Cardiology Dr.SMith  Discharge Exam: Filed Vitals:   12/23/12 0330 12/23/12 0400 12/23/12 0430 12/23/12 0455  BP: 156/66 179/73 146/60 145/55  Pulse: 56 58 54 59  Temp:    97.9 F (36.6 C)  TempSrc:    Oral  Resp: 11 14 14 18   Height:    5\' 3"  (1.6 m)  Weight:    48.3 kg (106 lb 7.7 oz)  SpO2: 96% 98% 97% 100%    General: AAOx3 Cardiovascular: S1S2/RRR Respiratory: CTAB  Discharge Instructions  Discharge Orders   Future Appointments Provider Department Dept Phone   03/15/2013 2:00 PM [facility] [phone]   03/22/2013 3:00 PM Ladene Artist, MD  [facility] [phone]   Future Orders Complete By Expires     Diet - low sodium heart healthy  As directed     Increase activity slowly  As directed         Medication List         ARTIFICIAL TEAR OP  Apply 1 drop to eye daily as needed. For dry eyes     aspirin 325 MG tablet  Take 325 mg by mouth daily.     Biotin 5000 MCG Caps  Take 5,000 mcg by mouth every evening.     CALCIUM 600 PO  Take 600 mg by mouth daily.     co-enzyme Q-10  30 MG capsule  Take 100 mg by mouth daily.     fluticasone 50 MCG/ACT nasal spray  Commonly known as:  FLONASE  Place 2 sprays into the nose daily as needed. For allergies     isosorbide mononitrate 30 MG 24 hr tablet  Commonly known as:  IMDUR  Take 30 mg by mouth daily.     LORazepam 0.5 MG tablet  Commonly known as:  ATIVAN  Take 0.25 mg by mouth at bedtime.     metoprolol tartrate 25 MG tablet  Commonly known as:  LOPRESSOR  Take 25 mg by mouth 2 (two) times daily.     multivitamin with minerals Tabs  Take 1 tablet by mouth daily.     MYRBETRIQ 50 MG Tb24  Generic drug:  mirabegron ER  Take 50 mg by mouth daily.      NITROSTAT 0.4 MG SL tablet  Generic drug:  nitroGLYCERIN  Place 0.4 mg under the tongue every 5 (five) minutes as needed for chest pain.     polyethylene glycol packet  Commonly known as:  MIRALAX / GLYCOLAX  Take 17 g by mouth at bedtime.     rosuvastatin 40 MG tablet  Commonly known as:  CRESTOR  Take 40 mg by mouth every evening.     SALINE NASAL SPRAY NA  Place 3-4 sprays into the nose 2 (two) times daily as needed (for dryness).     sertraline 100 MG tablet  Commonly known as:  ZOLOFT  Take 100 mg by mouth every morning.       Allergies  Allergen Reactions  . Levaquin (Levofloxacin) Shortness Of Breath  . Codeine Nausea Only  . Erythromycin Nausea And Vomiting    All "mycin" drugs  . Food Other (See Comments)    No spicy foods or black pepper, raw onions - causes gerd  . Macrodantin Other (See Comments)    Double vision  . Morphine And Related Other (See Comments)    hallucinations  . Simvastatin Other (See Comments)    Muscle aches  . Butazolidin (Phenylbutazone) Swelling and Rash  . Ibuprofen Hives and Rash  . Penicillins Rash       Follow-up Information   Follow up with Lesleigh Noe, MD. Schedule an appointment as soon as possible for a visit in 2 weeks.   Contact information:   [address] [employer] [place] [address] [phone]        The results of significant diagnostics from this hospitalization (including imaging, microbiology, ancillary and laboratory) are listed below for reference.    Significant Diagnostic Studies: Dg Chest Portable 1 View  12/23/2012   *RADIOLOGY REPORT*  Clinical Data: Chest pain.  PORTABLE CHEST - 1 VIEW  Comparison: Chest radiograph performed 06/30/2011, and CT of the chest performed 09/19/2009  Findings: The lungs are well-aerated.  There is no evidence of focal opacification, pleural effusion or pneumothorax.  Known pulmonary nodules are less well as on radiograph.  The cardiomediastinal silhouette  is normal in size; the patient is status post median sternotomy, with evidence of prior CABG.  No acute osseous abnormalities are seen.  IMPRESSION: No acute cardiopulmonary process seen.   Original Report Authenticated By: Tonia Ghent, M.D.    Microbiology: No results found for this or any previous visit (from the past 240 hour(s)).   Labs: Basic Metabolic Panel:  Recent Labs Lab 12/23/12 0209 12/23/12 0218 12/23/12 0840  NA 137 140 138  K 3.8 4.0 3.2*  CL 98 100 99  CO2 31  --  28  GLUCOSE 99 100* 140*  BUN 28* 30* 23  CREATININE 0.89 1.00 0.85  CALCIUM 9.9  --  9.6   Liver Function Tests:  Recent Labs Lab 12/23/12 0209 12/23/12 0840  AST 65* 56*  ALT 38* 36*  ALKPHOS 44 44  BILITOT 0.2* 0.2*  PROT 7.9 8.1  ALBUMIN 3.3* 3.5   No results found for this basename: LIPASE, AMYLASE,  in the last 168 hours No results found for this basename: AMMONIA,  in the last 168 hours CBC:  Recent Labs Lab 12/23/12 0209 12/23/12 0218 12/23/12 0840  WBC 4.4  --  4.2  NEUTROABS  --   --  2.6  HGB 11.1* 11.2* 11.5*  HCT 31.7* 33.0* 33.2*  MCV 94.6  --  94.6  PLT 224  --  236   Cardiac Enzymes:  Recent Labs Lab 12/23/12 0840 12/23/12 1310  TROPONINI <0.30 <0.30   BNP: BNP (last 3 results) No results found for this basename: PROBNP,  in the last 8760 hours CBG: No results found for this basename: GLUCAP,  in the last 168 hours     Signed:  Adelaide Pfefferkorn  [facility] 12/23/2012, 3:03 PM

## 2012-12-23 NOTE — Progress Notes (Signed)
Pt seen and examined, admitted this am per Dr.Kakrakandy 81/F with CAD s/p CABG 2007 Admitted following 3 episodes of chest pain last night, total of 5 episodes of chest pain this week. EKG unchanged from prior and cardiac enz x1 negative Recent Cath 1/14 with Widely patent LAD and circumflex,  native RCA is totally occluded in the mid vessel, Widely patent saphenous vein graft to the right coronary,  Previously demonstrated atretic LIMA to LAD and Normal LV function with the mid anterior wall motion abnormality, unchanged. EF 60% On imdur 30mg  daily, ? Increase dose Due to increased frequency in episodes of chest pain recently will ask [employer] [facility] for input. Cycle cardiac enzymes, monitor on Tele  Zannie Cove, MD [phone]

## 2012-12-23 NOTE — Progress Notes (Signed)
Utilization review completed.  

## 2012-12-23 NOTE — Consult Note (Signed)
Admit date: 12/23/2012 Referring Physician  Mitchel Honour, M.D. Primary Physician  Merlene Laughter, M.D. Primary Cardiologist  [facility] Leia Alf, M.D. Reason for Consultation  Chest pain  ASSESSMENT: 1. Atypical chest pain without EKG changes and negative markers to this point  2 . History of recurring episodes of chest pain with negative 2011 cardiac catheterization demonstrating LIMA to LAD and atresia and patent SVG to RCA, non-ischemic Cardiolite study 2012, unremarkable repeat coronary angiogram in January 2014 unchanged from 2011.  3. Coronary artery disease with LIMA to LAD and saphenous vein graft to right coronary, 2007. LIMA was demonstrated to be atretic by cath in 2011 (as noted above)  4. Anxiety disorder  5. Hypertension  PLAN:  1. Its serial markers and EKGs remain normal, no further cardiac evaluation should be performed. Discomfort is likely noncardiac. Should markers become positive or EKGs developed ischemic changes, repeat cath would be considered.   HPI: The patient has been having intermittent chest pain over the past week. She was admitted after having 3 episodes of relatively short duration upper sternal discomfort. She uses nitroglycerin for the final episode and feels that there was some relief. EKGs have been unremarkable. We feel that over the past several years she has had some difficulty with costochondritis. She's had multiple cardiac evaluations since 2011 including heart catheterization ((x2), and a myocardial perfusion study. Though she has had multiple admissions, myocardial infarction has not occurred. She did have mildly elevated cardiac markers in January of 2014 but Did not reveal any evidence of significant obstruction to account for these findings. She is totally pain-free at this time .   PMH:   Past Medical History  Diagnosis Date  . Hx of CABG   . Hypertension   . GERD (gastroesophageal reflux disease)   . Anxiety   . Coronary artery disease   .  Arthritis      PSH:   Past Surgical History  Procedure Laterality Date  . Coronary artery bypass graft  2008  . Cardiac catheterization  2007,    2 stents  . Cardiac catheterization  2014  . Tonsillectomy    . Dilation and curettage of uterus    . Eye surgery      both cataracts  . Colonoscopy    . Upper gastrointestinal endoscopy    . Capsulotomy Right 08/04/2012    Procedure: CAPSULOTOMY;  Surgeon: Sherri Rad, MD;  Location: [facility];  Service: Orthopedics;  Laterality: Right;  RIGHT 2ND TOE METATARSOPHALANGEAL JOINT DORSAL CAPSULOTOMY   . Bunionectomy with hammertoe reconstruction Right 08/04/2012    Procedure:  HAMMERTOE RECONSTRUCTION;  Surgeon: Sherri Rad, MD;  Location: [facility];  Service: Orthopedics;  Laterality: Right;  HAMMER TOE RECONSTRUCTION PROBABLE FLEXOR DIGITORUM LONGUS TO PROXIMAL PHALANX TRANSFER LATERALIZED     Allergies:  Levaquin; Codeine; Erythromycin; Food; Macrodantin; Morphine and related; Simvastatin; Butazolidin; Ibuprofen; and Penicillins Prior to Admit Meds:   Prescriptions prior to admission  Medication Sig Dispense Refill  . ARTIFICIAL TEAR OP Apply 1 drop to eye daily as needed. For dry eyes      . aspirin 325 MG tablet Take 325 mg by mouth daily.      . Biotin 5000 MCG CAPS Take 5,000 mcg by mouth every evening.       . Calcium Carbonate (CALCIUM 600 PO) Take 600 mg by mouth daily.      Marland Kitchen co-enzyme Q-10 30 MG capsule Take 100 mg by mouth daily.      Marland Kitchen  fluticasone (FLONASE) 50 MCG/ACT nasal spray Place 2 sprays into the nose daily as needed. For allergies      . isosorbide mononitrate (IMDUR) 30 MG 24 hr tablet Take 30 mg by mouth daily.      Marland Kitchen LORazepam (ATIVAN) 0.5 MG tablet Take 0.25 mg by mouth at bedtime.       . metoprolol tartrate (LOPRESSOR) 25 MG tablet Take 25 mg by mouth 2 (two) times daily.      . mirabegron ER (MYRBETRIQ) 50 MG TB24 Take 50 mg by mouth daily.      . Multiple Vitamin (MULITIVITAMIN  WITH MINERALS) TABS Take 1 tablet by mouth daily.      Marland Kitchen NITROSTAT 0.4 MG SL tablet Place 0.4 mg under the tongue every 5 (five) minutes as needed for chest pain.       . polyethylene glycol (MIRALAX / GLYCOLAX) packet Take 17 g by mouth at bedtime.      . rosuvastatin (CRESTOR) 40 MG tablet Take 40 mg by mouth every evening.       Marland Kitchen SALINE NASAL SPRAY NA Place 3-4 sprays into the nose 2 (two) times daily as needed (for dryness).       . sertraline (ZOLOFT) 100 MG tablet Take 100 mg by mouth every morning.        Fam HX:   History reviewed. No pertinent family history. Social HX:    History   Social History  . Marital Status: Widowed    Spouse Name: N/A    Number of Children: N/A  . Years of Education: N/A   Occupational History  . Not on file.   Social History Main Topics  . Smoking status: Never Smoker   . Smokeless tobacco: Not on file  . Alcohol Use: Yes     Comment: occ  . Drug Use: No  . Sexually Active: Not on file   Other Topics Concern  . Not on file   Social History Narrative  . No narrative on file     Review of Systems: No current discomfort. No exertional intolerance. She denies dyspnea. She has not had palpitations or syncope. There is no edema. No transient neurological complaints.  Physical Exam: Blood pressure 145/55, pulse 59, temperature 97.9 F (36.6 C), temperature source Oral, resp. rate 18, height 5\' 3"  (1.6 m), weight 48.3 kg (106 lb 7.7 oz), SpO2 100.00%. Weight change:    Cardiac exam is unremarkable.  Skin is pale but warm.  Chest is clear to auscultation and percussion.  Cardiac exam reveals no murmur, rub, click, or gallop  Abdomen is soft. Bowel sounds are normal.  Extremities reveal no edema.  Neurological exam is normal  Labs:   Lab Results  Component Value Date   WBC 4.2 12/23/2012   HGB 11.5* 12/23/2012   HCT 33.2* 12/23/2012   MCV 94.6 12/23/2012   PLT 236 12/23/2012    Recent Labs Lab 12/23/12 0840  NA 138  K 3.2*    CL 99  CO2 28  BUN 23  CREATININE 0.85  CALCIUM 9.6  PROT 8.1  BILITOT 0.2*  ALKPHOS 44  ALT 36*  AST 56*  GLUCOSE 140*   No results found for this basename: PTT   Lab Results  Component Value Date   INR 1.05 07/08/2012   INR 1.01 06/30/2011   INR 1.18 09/30/2010   Lab Results  Component Value Date   CKTOTAL 138 10/30/2010   CKMB 2.2 10/30/2010   TROPONINI <0.30 12/23/2012  Lab Results  Component Value Date   CHOL 128 07/09/2012   CHOL  Value: 145        ATP III CLASSIFICATION:  <200     mg/dL   Desirable  161-096  mg/dL   Borderline High  >=045    mg/dL   High        09/15/8117   CHOL  Value: 133        ATP III CLASSIFICATION:  <200     mg/dL   Desirable  147-829  mg/dL   Borderline High  >=562    mg/dL   High        13/0/8657   Lab Results  Component Value Date   HDL 73 07/09/2012   HDL 72 8/46/9629   HDL 66 52/01/4131   Lab Results  Component Value Date   LDLCALC 51 07/09/2012   LDLCALC  Value: 66        Total Cholesterol/HDL:CHD Risk Coronary Heart Disease Risk Table                     Men   Women  1/2 Average Risk   3.4   3.3  Average Risk       5.0   4.4  2 X Average Risk   9.6   7.1  3 X Average Risk  23.4   11.0        Use the calculated Patient Ratio above and the CHD Risk Table to determine the patient's CHD Risk.        ATP III CLASSIFICATION (LDL):  <100     mg/dL   Optimal  440-102  mg/dL   Near or Above                    Optimal  130-159  mg/dL   Borderline  725-366  mg/dL   High  >440     mg/dL   Very High 3/47/4259   LDLCALC  Value: 60        Total Cholesterol/HDL:CHD Risk Coronary Heart Disease Risk Table                     Men   Women  1/2 Average Risk   3.4   3.3  Average Risk       5.0   4.4  2 X Average Risk   9.6   7.1  3 X Average Risk  23.4   11.0        Use the calculated Patient Ratio above and the CHD Risk Table to determine the patient's CHD Risk.        ATP III CLASSIFICATION (LDL):  <100     mg/dL   Optimal  563-875  mg/dL   Near or Above                     Optimal  130-159  mg/dL   Borderline  643-329  mg/dL   High  >518     mg/dL   Very High 84/06/6604   Lab Results  Component Value Date   TRIG 22 07/09/2012   TRIG 33 10/01/2010   TRIG 37 03/12/2010   Lab Results  Component Value Date   CHOLHDL 1.8 07/09/2012   CHOLHDL 2.0 10/01/2010   CHOLHDL 2.0 03/12/2010   No results found for this basename: LDLDIRECT      Radiology:  Dg Chest Portable 1 View  12/23/2012   *RADIOLOGY  REPORT*  Clinical Data: Chest pain.  PORTABLE CHEST - 1 VIEW  Comparison: Chest radiograph performed 06/30/2011, and CT of the chest performed 09/19/2009  Findings: The lungs are well-aerated.  There is no evidence of focal opacification, pleural effusion or pneumothorax.  Known pulmonary nodules are less well as on radiograph.  The cardiomediastinal silhouette is normal in size; the patient is status post median sternotomy, with evidence of prior CABG.  No acute osseous abnormalities are seen.  IMPRESSION: No acute cardiopulmonary process seen.   Original Report Authenticated By: Tonia Ghent, M.D.   EKG:  Nonspecific ST abnormality unchanged from prior tracings. Normal sinus rhythm. No acute ST-T wave change.    Lesleigh Noe 12/23/2012 1:19 PM

## 2012-12-23 NOTE — ED Notes (Signed)
Per EMS: pt coming from [facility]. Pt c/o intermittent chest pressure since 5 pm after she had ice cream. Pt reports pressure on and off for the last week. Pt was given 324 asa and nitro x2. Pt is now pain free. Respirations equal and unlabored, A&Ox4, skin warm and dry, 12 lead unremarkable.

## 2012-12-29 ENCOUNTER — Telehealth: Payer: Self-pay | Admitting: *Deleted

## 2012-12-29 NOTE — Telephone Encounter (Signed)
Received message from pt reporting "feel really tired, slightly dizzy and having some night sweats.  Went to ED last Wednesday and they said my heart was OK."  12/23/12 Hgb 11.5  After leaving several messages; finally spoke with pt; states "just wanted  Dr. Truett Perna to know."  Explained to pt that MD made aware of symptoms and recommended that she see PCP.  Pt states she got an appt 12/30/12 with PCP to go over these symptoms.

## 2013-01-05 ENCOUNTER — Other Ambulatory Visit: Payer: Self-pay | Admitting: Gastroenterology

## 2013-01-05 DIAGNOSIS — R0789 Other chest pain: Secondary | ICD-10-CM

## 2013-01-12 ENCOUNTER — Emergency Department (HOSPITAL_COMMUNITY): Payer: Medicare Other

## 2013-01-12 ENCOUNTER — Encounter (HOSPITAL_COMMUNITY): Payer: Self-pay | Admitting: *Deleted

## 2013-01-12 ENCOUNTER — Other Ambulatory Visit: Payer: Medicare Other

## 2013-01-12 ENCOUNTER — Inpatient Hospital Stay (HOSPITAL_COMMUNITY)
Admission: EM | Admit: 2013-01-12 | Discharge: 2013-01-14 | DRG: 313 | Payer: Medicare Other | Attending: Internal Medicine | Admitting: Internal Medicine

## 2013-01-12 DIAGNOSIS — F329 Major depressive disorder, single episode, unspecified: Secondary | ICD-10-CM | POA: Diagnosis present

## 2013-01-12 DIAGNOSIS — I10 Essential (primary) hypertension: Secondary | ICD-10-CM

## 2013-01-12 DIAGNOSIS — M129 Arthropathy, unspecified: Secondary | ICD-10-CM | POA: Diagnosis present

## 2013-01-12 DIAGNOSIS — C9000 Multiple myeloma not having achieved remission: Secondary | ICD-10-CM

## 2013-01-12 DIAGNOSIS — R0789 Other chest pain: Secondary | ICD-10-CM

## 2013-01-12 DIAGNOSIS — Z951 Presence of aortocoronary bypass graft: Secondary | ICD-10-CM

## 2013-01-12 DIAGNOSIS — R079 Chest pain, unspecified: Secondary | ICD-10-CM

## 2013-01-12 DIAGNOSIS — I200 Unstable angina: Secondary | ICD-10-CM

## 2013-01-12 DIAGNOSIS — F411 Generalized anxiety disorder: Secondary | ICD-10-CM

## 2013-01-12 DIAGNOSIS — E785 Hyperlipidemia, unspecified: Secondary | ICD-10-CM

## 2013-01-12 DIAGNOSIS — M79609 Pain in unspecified limb: Secondary | ICD-10-CM

## 2013-01-12 DIAGNOSIS — R269 Unspecified abnormalities of gait and mobility: Secondary | ICD-10-CM

## 2013-01-12 DIAGNOSIS — K219 Gastro-esophageal reflux disease without esophagitis: Secondary | ICD-10-CM

## 2013-01-12 DIAGNOSIS — I25810 Atherosclerosis of coronary artery bypass graft(s) without angina pectoris: Secondary | ICD-10-CM

## 2013-01-12 DIAGNOSIS — F3289 Other specified depressive episodes: Secondary | ICD-10-CM | POA: Diagnosis present

## 2013-01-12 DIAGNOSIS — M76899 Other specified enthesopathies of unspecified lower limb, excluding foot: Secondary | ICD-10-CM

## 2013-01-12 DIAGNOSIS — F32A Depression, unspecified: Secondary | ICD-10-CM

## 2013-01-12 DIAGNOSIS — I2510 Atherosclerotic heart disease of native coronary artery without angina pectoris: Secondary | ICD-10-CM | POA: Diagnosis present

## 2013-01-12 DIAGNOSIS — M47817 Spondylosis without myelopathy or radiculopathy, lumbosacral region: Secondary | ICD-10-CM

## 2013-01-12 DIAGNOSIS — I7300 Raynaud's syndrome without gangrene: Secondary | ICD-10-CM

## 2013-01-12 DIAGNOSIS — M67919 Unspecified disorder of synovium and tendon, unspecified shoulder: Secondary | ICD-10-CM

## 2013-01-12 DIAGNOSIS — M719 Bursopathy, unspecified: Secondary | ICD-10-CM

## 2013-01-12 LAB — BASIC METABOLIC PANEL
BUN: 19 mg/dL (ref 6–23)
CO2: 29 mEq/L (ref 19–32)
Glucose, Bld: 100 mg/dL — ABNORMAL HIGH (ref 70–99)
Potassium: 3.7 mEq/L (ref 3.5–5.1)
Sodium: 136 mEq/L (ref 135–145)

## 2013-01-12 LAB — CBC WITH DIFFERENTIAL/PLATELET
Basophils Relative: 0 % (ref 0–1)
Eosinophils Absolute: 0.2 10*3/uL (ref 0.0–0.7)
Eosinophils Relative: 3 % (ref 0–5)
Hemoglobin: 11.3 g/dL — ABNORMAL LOW (ref 12.0–15.0)
Lymphs Abs: 1.4 10*3/uL (ref 0.7–4.0)
MCH: 33.5 pg (ref 26.0–34.0)
MCHC: 35.4 g/dL (ref 30.0–36.0)
MCV: 94.7 fL (ref 78.0–100.0)
Monocytes Absolute: 0.4 10*3/uL (ref 0.1–1.0)
Monocytes Relative: 8 % (ref 3–12)
RBC: 3.37 MIL/uL — ABNORMAL LOW (ref 3.87–5.11)

## 2013-01-12 LAB — LIPASE, BLOOD: Lipase: 51 U/L (ref 11–59)

## 2013-01-12 LAB — CK TOTAL AND CKMB (NOT AT ARMC)
CK, MB: 2.9 ng/mL (ref 0.3–4.0)
Relative Index: 1.7 (ref 0.0–2.5)
Total CK: 168 U/L (ref 7–177)

## 2013-01-12 LAB — TROPONIN I
Troponin I: 0.33 ng/mL (ref <0.30)
Troponin I: 0.31 ng/mL (ref <0.30)

## 2013-01-12 LAB — POCT I-STAT TROPONIN I

## 2013-01-12 MED ORDER — ISOSORBIDE MONONITRATE ER 30 MG PO TB24
30.0000 mg | ORAL_TABLET | Freq: Every day | ORAL | Status: DC
  Administered 2013-01-12 – 2013-01-14 (×3): 30 mg via ORAL
  Filled 2013-01-12 (×3): qty 1

## 2013-01-12 MED ORDER — METOPROLOL TARTRATE 25 MG PO TABS
25.0000 mg | ORAL_TABLET | Freq: Two times a day (BID) | ORAL | Status: DC
  Administered 2013-01-12 – 2013-01-14 (×5): 25 mg via ORAL
  Filled 2013-01-12 (×6): qty 1

## 2013-01-12 MED ORDER — SODIUM CHLORIDE 0.9 % IV BOLUS (SEPSIS)
500.0000 mL | Freq: Once | INTRAVENOUS | Status: AC
  Administered 2013-01-12: 500 mL via INTRAVENOUS

## 2013-01-12 MED ORDER — SODIUM CHLORIDE 0.9 % IJ SOLN
3.0000 mL | Freq: Two times a day (BID) | INTRAMUSCULAR | Status: DC
  Administered 2013-01-12 – 2013-01-13 (×4): 3 mL via INTRAVENOUS

## 2013-01-12 MED ORDER — SERTRALINE HCL 100 MG PO TABS
100.0000 mg | ORAL_TABLET | Freq: Every morning | ORAL | Status: DC
  Administered 2013-01-12 – 2013-01-14 (×3): 100 mg via ORAL
  Filled 2013-01-12 (×3): qty 1

## 2013-01-12 MED ORDER — ROSUVASTATIN CALCIUM 40 MG PO TABS
40.0000 mg | ORAL_TABLET | Freq: Every day | ORAL | Status: DC
  Administered 2013-01-12 – 2013-01-13 (×2): 40 mg via ORAL
  Filled 2013-01-12 (×3): qty 1

## 2013-01-12 MED ORDER — ASPIRIN 325 MG PO TABS
325.0000 mg | ORAL_TABLET | Freq: Every day | ORAL | Status: DC
  Administered 2013-01-12 – 2013-01-14 (×3): 325 mg via ORAL
  Filled 2013-01-12 (×3): qty 1

## 2013-01-12 MED ORDER — HEPARIN SODIUM (PORCINE) 5000 UNIT/ML IJ SOLN
5000.0000 [IU] | Freq: Three times a day (TID) | INTRAMUSCULAR | Status: DC
  Administered 2013-01-12 – 2013-01-14 (×7): 5000 [IU] via SUBCUTANEOUS
  Filled 2013-01-12 (×11): qty 1

## 2013-01-12 MED ORDER — NITROGLYCERIN 0.4 MG SL SUBL
0.4000 mg | SUBLINGUAL_TABLET | SUBLINGUAL | Status: DC | PRN
  Administered 2013-01-13 – 2013-01-14 (×2): 0.4 mg via SUBLINGUAL
  Filled 2013-01-12 (×2): qty 25

## 2013-01-12 MED ORDER — LORAZEPAM 0.5 MG PO TABS
0.2500 mg | ORAL_TABLET | Freq: Every day | ORAL | Status: DC
  Administered 2013-01-12 – 2013-01-13 (×2): 0.25 mg via ORAL
  Filled 2013-01-12 (×2): qty 1

## 2013-01-12 MED ORDER — MIRABEGRON ER 50 MG PO TB24
50.0000 mg | ORAL_TABLET | Freq: Every day | ORAL | Status: DC
  Administered 2013-01-12 – 2013-01-14 (×3): 50 mg via ORAL
  Filled 2013-01-12 (×3): qty 1

## 2013-01-12 NOTE — Care Management Note (Unsigned)
    Page 1 of 1   01/12/2013     1:54:06 PM   CARE MANAGEMENT NOTE 01/12/2013  Patient:  [name], [name]   Account Number:  [id number]  Date Initiated:  01/12/2013  Documentation initiated by:  Burch Marchuk  Subjective/Objective Assessment:   PT ADM ON 01/12/13 WITH CHEST PAIN, INCREASED TROPONINS. PTA, PT INDEPENDENT, LIVES WITH SPOUSE.     Action/Plan:   WILL FOLLOW FOR HOME NEEDS AS PT PROGRESSES.   Anticipated DC Date:  01/13/2013   Anticipated DC Plan:  HOME/SELF CARE      DC Planning Services  CM consult      Choice offered to / List presented to:             Status of service:  In process, will continue to follow Medicare Important Message given?   (If response is "NO", the following Medicare IM given date fields will be blank) Date Medicare IM given:   Date Additional Medicare IM given:    Discharge Disposition:    Per UR Regulation:  Reviewed for med. necessity/level of care/duration of stay  If discussed at Long Length of Stay Meetings, dates discussed:    Comments:

## 2013-01-12 NOTE — ED Notes (Signed)
Old and New EKG given to Dr Bednar 

## 2013-01-12 NOTE — ED Notes (Signed)
Per EMS: pt states that she has been having pain during the night in her chest for the past month. Pt states that tonight it was a pressure pain and she also took 3 SL nito with no relief pt given 3 SL nitro with EMS and pain decreased

## 2013-01-12 NOTE — ED Provider Notes (Signed)
CSN: [id number]     Arrival date & time 01/12/13  0205 History     First MD Initiated Contact with Patient 01/12/13 0226     Chief Complaint  Patient presents with  . Chest Pain   (Consider location/radiation/quality/duration/timing/severity/associated sxs/prior Treatment) HPI This 79 year old female has a history of coronary artery disease with prior CABG as well as stents, she had unremarkable catheterization in January of this year, she was admitted a couple weeks ago with atypical chest pain syndrome felt likely noncardiac, she did not have EKG changes or elevated troponin at that time, she has been having almost nightly spells of a chest pressure tightness lasting less than 15 minutes resolving with nitroglycerin since discharge, tonight however she had a more intense episode lasting about half an hour to 45 minutes resolved with 6 nitroglycerin, she had no radiation or associated symptoms but did have diffuse chest pressure tightness squeezing with no syncope no altered mental status no shortness of breath no sweats no nausea no abdominal pain no back pain. Pain-free in ED. Past Medical History  Diagnosis Date  . Hx of CABG   . Hypertension   . GERD (gastroesophageal reflux disease)   . Anxiety   . Coronary artery disease   . Arthritis   . Anginal pain   . Shortness of breath   . Intermediate coronary syndrome    Past Surgical History  Procedure Laterality Date  . Coronary artery bypass graft  2008  . Cardiac catheterization  2007,    2 stents  . Cardiac catheterization  2014  . Tonsillectomy    . Dilation and curettage of uterus    . Eye surgery      both cataracts  . Colonoscopy    . Upper gastrointestinal endoscopy    . Capsulotomy Right 08/04/2012    Procedure: CAPSULOTOMY;  Surgeon: Sherri Rad, MD;  Location: [facility];  Service: Orthopedics;  Laterality: Right;  RIGHT 2ND TOE METATARSOPHALANGEAL JOINT DORSAL CAPSULOTOMY   . Bunionectomy with  hammertoe reconstruction Right 08/04/2012    Procedure:  HAMMERTOE RECONSTRUCTION;  Surgeon: Sherri Rad, MD;  Location: [facility];  Service: Orthopedics;  Laterality: Right;  HAMMER TOE RECONSTRUCTION PROBABLE FLEXOR DIGITORUM LONGUS TO PROXIMAL PHALANX TRANSFER LATERALIZED    History reviewed. No pertinent family history. History  Substance Use Topics  . Smoking status: Never Smoker   . Smokeless tobacco: Never Used  . Alcohol Use: Yes     Comment: occ   OB History   Grav Para Term Preterm Abortions TAB SAB Ect Mult Living                 Review of Systems 10 Systems reviewed and are negative for acute change except as noted in the HPI. Allergies  Levaquin; Codeine; Erythromycin; Food; Macrodantin; Morphine and related; Simvastatin; Butazolidin; Ibuprofen; and Penicillins  Home Medications   No current outpatient prescriptions on file. BP 111/57  Pulse 55  Temp(Src) 98.6 F (37 C) (Oral)  Resp 18  Ht 5\' 3"  (1.6 m)  Wt 106 lb 3.2 oz (48.172 kg)  BMI 18.82 kg/m2  SpO2 99% Physical Exam  Nursing note and vitals reviewed. Constitutional:  Awake, alert, nontoxic appearance.  HENT:  Head: Atraumatic.  Eyes: Right eye exhibits no discharge. Left eye exhibits no discharge.  Neck: Neck supple.  Cardiovascular: Normal rate and regular rhythm.   No murmur heard. Pulmonary/Chest: Effort normal and breath sounds normal. No respiratory distress. She has no  wheezes. She has no rales. She exhibits no tenderness.  Abdominal: Soft. Bowel sounds are normal. There is no tenderness. There is no rebound and no guarding.  Musculoskeletal: She exhibits no edema and no tenderness.  Baseline ROM, no obvious new focal weakness.  Neurological:  Mental status and motor strength appears baseline for patient and situation.  Skin: No rash noted.  Psychiatric: She has a normal mood and affect.    ED Course  ECG: Sinus rhythm, ventricular rate 65, normal axis, subtle  anterolateral ST depression appears slightly more evident compared with 12/23/2012 Procedures (including critical care time) ECG tonight shows apparent subtle ST depression anterolaterally compared with previous ECG, Cards states Pt has had similar ECG changes previously consider Med Obs for repeat trops, [facility] paged. Patient understand and agree with initial ED impression and plan with expectations set for ED visit. Labs Reviewed  BASIC METABOLIC PANEL - Abnormal; Notable for the following:    Glucose, Bld 100 (*)    GFR calc non Af Amer 52 (*)    GFR calc Af Amer 60 (*)    All other components within normal limits  CBC WITH DIFFERENTIAL - Abnormal; Notable for the following:    RBC 3.37 (*)    Hemoglobin 11.3 (*)    HCT 31.9 (*)    All other components within normal limits  TROPONIN I - Abnormal; Notable for the following:    Troponin I 0.33 (*)    All other components within normal limits  PROTIME-INR  LIPASE, BLOOD  D-DIMER, QUANTITATIVE  TROPONIN I  TROPONIN I  POCT I-STAT TROPONIN I   Dg Chest Port 1 View  01/12/2013   *RADIOLOGY REPORT*  Clinical Data: Chest pain and shortness of breath.  PORTABLE CHEST - 1 VIEW  Comparison: CT chest 09/19/2009 and plain film chest 12/23/2012.  Findings: The lungs are clear.  Heart size is upper normal.  The patient is status post CABG.  No pneumothorax or pleural fluid.  IMPRESSION: No acute disease.   Original Report Authenticated By: Holley Dexter, M.D.   1. Chest pain at rest   2. Coronary atherosclerosis of artery bypass graft   3. Hx of CABG   4. Intermediate coronary syndrome     MDM  The patient appears reasonably stabilized for admission considering the current resources, flow, and capabilities available in the ED at this time, and I doubt any other [facility] requiring further screening and/or treatment in the ED prior to admission.  Hurman Horn, MD 01/12/13 [phone]

## 2013-01-12 NOTE — Progress Notes (Addendum)
Patient admitted after midnight. Chart reviewed. Patient examined. No further chest pain. Troponin has increased to 0.33. She reports that the pain has been sharp and located on both sides. Worse with fast walking. Somewhat relieved with 3 nitroglycerin last night, but not completely relieved. Has no previous history of pulmonary embolus. Did notice that her ankle was swollen after foot surgery several months ago. No leg pain or calf tenderness. No shortness of breath. Suffers from reflux but this does not feel like her to buckle reflux pain. Pain occurs almost every night but last night was very severe. takes AcipHex at home and recently had a reportedly normal upper endoscopy.   Cath report from January as below.  ANGIOGRAPHIC DATA: The left main coronary artery is widely patent.  The left anterior descending artery is transapical. A proximal stent is noted after the first diagonal. There is generalized mild ISR within the stent with less than 30% narrowing. The first diagonal contains ostial 70% narrowing no significant changes noted in the appearance of the LAD when compared to the angiogram from 2011  The left circumflex artery is moderate in size and trifurcates on the lateral wall. There is proximal 30% narrowing..  The right coronary artery is 90% in the mid vessel proximal to a previously placed stent.  BYPASS GRAFT ANGIOGRAM:  SVG to RCA: Widely patent. Multiple luminal irregularities are noted in the proximal and mid graft. No significant obstruction is seen. The PDA and left ventricular branches are widely patent.  LIMA to LAD: Atretic, demonstrated on prior angiograms.  LEFT VENTRICULOGRAM: Left ventricular angiogram was done in the 30 RAO projection and revealed normal left ventricular wall motion and systolic function with an estimated ejection fraction of 60%. LVEDP was 16 mmHg.  IMPRESSIONS: 1. Acute coronary syndrome with no obvious culprit.  2. Widely patent LAD and circumflex. The  native RCA is totally occluded in the mid vessel.  3. Widely patent saphenous vein graft to the right coronary.  4. Previously demonstrated atretic LIMA to LAD.  5. Overall normal LV function with the mid anterior wall motion abnormality, unchanged. EF 60%  Will consult cardiology for recommendations. Repeat EKG. Last night's EKG did show new lateral ischemic changes. No chest pain currently. Check d-dimer and if positive will need to rule out PE.  resume PPI. Will give protonic twice daily per   Crista Curb, M.D. [phone]

## 2013-01-12 NOTE — H&P (Signed)
[facility] History and Physical  [name]:[id number] DOB: 12/30/1931 DOA: 01/12/2013  Referring physician: ED PCP: Ginette Otto, MD   Chief Complaint: Chest pain  HPI: [name] is a 79 y.o. female known h/o CAD s/p CABG in 2007 who had a cardiac cath in Jan which showed widely patent LAD and Left circumflex, known total occlusion of RCA but widely patent vein graft to RCA, who presents with c/o chest pain.  She was just admitted for CP work up last month, but last evening her chest pain was the worst that it has been, lasting for 45 mins and relieved only with NTG.  Chest pain is located in her upper chest in the middle of her chest.  No associated SOB nor DOE, episodes seem to be worse at night but this is nothing like her GERD pain she states (last EGD was in June which was negative).  In ED initial troponin was 0.08, EKG again shows T wave inversion in anterior leads, this is old.  Dr. Fonnie Jarvis called [facility] cards who recommended just checking serial troponins but have no plans to do full cardiac ruleout at this time.  Hospitalist has been asked to admit to obs for serial troponins.  Review of Systems: 12 systems reviewed and otherwise negative.  Past Medical History  Diagnosis Date  . Hx of CABG   . Hypertension   . GERD (gastroesophageal reflux disease)   . Anxiety   . Coronary artery disease   . Arthritis    Past Surgical History  Procedure Laterality Date  . Coronary artery bypass graft  2008  . Cardiac catheterization  2007,    2 stents  . Cardiac catheterization  2014  . Tonsillectomy    . Dilation and curettage of uterus    . Eye surgery      both cataracts  . Colonoscopy    . Upper gastrointestinal endoscopy    . Capsulotomy Right 08/04/2012    Procedure: CAPSULOTOMY;  Surgeon: Sherri Rad, MD;  Location: [facility];  Service: Orthopedics;  Laterality: Right;  RIGHT 2ND TOE METATARSOPHALANGEAL JOINT DORSAL CAPSULOTOMY   .  Bunionectomy with hammertoe reconstruction Right 08/04/2012    Procedure:  HAMMERTOE RECONSTRUCTION;  Surgeon: Sherri Rad, MD;  Location: [facility];  Service: Orthopedics;  Laterality: Right;  HAMMER TOE RECONSTRUCTION PROBABLE FLEXOR DIGITORUM LONGUS TO PROXIMAL PHALANX TRANSFER LATERALIZED    Social History:  reports that she has never smoked. She does not have any smokeless tobacco history on file. She reports that  drinks alcohol. She reports that she does not use illicit drugs.  Allergies  Allergen Reactions  . Levaquin (Levofloxacin) Shortness Of Breath  . Codeine Nausea Only  . Erythromycin Nausea And Vomiting    All "mycin" drugs  . Food Other (See Comments)    No spicy foods or black pepper, raw onions - causes gerd  . Macrodantin Other (See Comments)    Double vision  . Morphine And Related Other (See Comments)    hallucinations  . Simvastatin Other (See Comments)    Muscle aches  . Butazolidin (Phenylbutazone) Swelling and Rash  . Ibuprofen Hives and Rash  . Penicillins Rash    History reviewed. No pertinent family history.  Prior to Admission medications   Medication Sig Start Date End Date Taking? Authorizing Provider  ARTIFICIAL TEAR OP Apply 1 drop to eye daily as needed. For dry eyes   Yes Historical Provider, MD  aspirin  325 MG tablet Take 325 mg by mouth daily.   Yes Historical Provider, MD  Biotin 5000 MCG CAPS Take 5,000 mcg by mouth every evening.    Yes Historical Provider, MD  Calcium Carbonate (CALCIUM 600 PO) Take 600 mg by mouth daily.   Yes Historical Provider, MD  co-enzyme Q-10 30 MG capsule Take 100 mg by mouth daily.   Yes Historical Provider, MD  fluticasone (FLONASE) 50 MCG/ACT nasal spray Place 2 sprays into the nose daily as needed. For allergies   Yes Historical Provider, MD  isosorbide mononitrate (IMDUR) 30 MG 24 hr tablet Take 30 mg by mouth daily.   Yes Historical Provider, MD  LORazepam (ATIVAN) 0.5 MG tablet Take 0.25 mg  by mouth at bedtime.    Yes Historical Provider, MD  metoprolol tartrate (LOPRESSOR) 25 MG tablet Take 25 mg by mouth 2 (two) times daily.   Yes Historical Provider, MD  mirabegron ER (MYRBETRIQ) 50 MG TB24 Take 50 mg by mouth daily.   Yes Historical Provider, MD  Multiple Vitamin (MULITIVITAMIN WITH MINERALS) TABS Take 1 tablet by mouth daily.   Yes Historical Provider, MD  NITROSTAT 0.4 MG SL tablet Place 0.4 mg under the tongue every 5 (five) minutes as needed for chest pain.  07/13/12  Yes Historical Provider, MD  polyethylene glycol (MIRALAX / GLYCOLAX) packet Take 17 g by mouth at bedtime.   Yes Historical Provider, MD  rosuvastatin (CRESTOR) 40 MG tablet Take 40 mg by mouth every evening.    Yes Historical Provider, MD  SALINE NASAL SPRAY NA Place 3-4 sprays into the nose 2 (two) times daily as needed (for dryness).    Yes Historical Provider, MD  sertraline (ZOLOFT) 100 MG tablet Take 100 mg by mouth every morning.    Yes Historical Provider, MD   Physical Exam: Filed Vitals:   01/12/13 0601  BP: 116/69  Pulse: 62  Temp: 97.9 F (36.6 C)  Resp: 16     General:  NAD, resting comfortably in bed  Eyes: PEERLA EOMI  ENT: mucous membranes moist  Neck: supple w/o JVD  Cardiovascular: RRR w/o MRG  Respiratory: CTA B  Abdomen: soft, nt, nd, bs+  Skin: no rash nor lesion  Musculoskeletal: MAE, full ROM all 4 extremities  Psychiatric: normal tone and affect  Neurologic: AAOx3, grossly non-focal   Labs on Admission:  Basic Metabolic Panel:  Recent Labs Lab 01/12/13 0241  NA 136  K 3.7  CL 100  CO2 29  GLUCOSE 100*  BUN 19  CREATININE 1.00  CALCIUM 9.7   Liver Function Tests: No results found for this basename: AST, ALT, ALKPHOS, BILITOT, PROT, ALBUMIN,  in the last 168 hours No results found for this basename: LIPASE, AMYLASE,  in the last 168 hours No results found for this basename: AMMONIA,  in the last 168 hours CBC:  Recent Labs Lab 01/12/13 0241   WBC 4.8  NEUTROABS 2.8  HGB 11.3*  HCT 31.9*  MCV 94.7  PLT 207   Cardiac Enzymes: No results found for this basename: CKTOTAL, CKMB, CKMBINDEX, TROPONINI,  in the last 168 hours  BNP (last 3 results) No results found for this basename: PROBNP,  in the last 8760 hours CBG: No results found for this basename: GLUCAP,  in the last 168 hours  Radiological Exams on Admission: Dg Chest Port 1 View  01/12/2013   *RADIOLOGY REPORT*  Clinical Data: Chest pain and shortness of breath.  PORTABLE CHEST - 1 VIEW  Comparison:  CT chest 09/19/2009 and plain film chest 12/23/2012.  Findings: The lungs are clear.  Heart size is upper normal.  The patient is status post CABG.  No pneumothorax or pleural fluid.  IMPRESSION: No acute disease.   Original Report Authenticated By: Holley Dexter, M.D.    EKG: Independently reviewed.  Assessment/Plan Principal Problem:   CHEST PAIN, NON-CARDIAC Active Problems:   CORONARY ATHEROSCLEROSIS, ARTERY BYPASS GRAFT   1. Chest pain - sounds to be non-cardiac at this point, checking serial troponins, if negative cards recommends no further cardiac work up at this point.  Only suspicious finding at this time is the fact that NTG makes her chest pain better.  But other than the known RCA occlusion, her cath in January showed widely patent coronary arteries (without even significant stenosis described).  Suspicious for vasospastic angina. 2. CAD - continue home meds    Code Status: Full code (must indicate code status--if unknown or must be presumed, indicate so) Family Communication: No family in room (indicate person spoken with, if applicable, with phone number if by telephone) Disposition Plan: Admit to obs (indicate anticipated LOS)  Time spent: 70 min  Daniyla Pfahler M. [facility] Pager [phone]  If 7PM-7AM, please contact night-coverage [email or web address] Password [facility] 01/12/2013, 6:32 AM

## 2013-01-12 NOTE — ED Notes (Signed)
I stat troponin results given to Dr. Fonnie Jarvis by B. Bing Plume, EMT

## 2013-01-12 NOTE — ED Notes (Signed)
Pt states that she has been having episodes of chest pain in her central chest since 12/16/2012. Pt states that she sometimes experiences dizziness and nausea with the chest pain.

## 2013-01-12 NOTE — Consult Note (Addendum)
Admit date: 01/12/2013 Referring Physician  Dr. Lendell Caprice  Primary Cardiologist  Eldridge Dace Reason for Consultation  Chest pain  HPI: 79 year old woman who has a history of coronary artery disease status post bypass surgery.  She has been having intermittent upper chest discomfort over the last several days.  She had a severe episode last night starting about 7 PM after dinner.  She did not use any nitroglycerin.  It improved but did not completely go away.  She had some mild dizziness.  No shortness of breath or diaphoresis.  She is not very active.  Her most strenuous activity is walking from her room to the dining hall of her return the community.  She states it if she walks fast, this can cause some chest discomfort.  She underwent cardiac catheterization by Dr. Katrinka Blazing about 6 months ago.  At that time, her vein graft to RCA was opened.  Stents in the left system were also widely patent.  She was medically managed at that time.  At that time, her troponin was negative and her ECG was nonischemic.  Her first troponin here is slightly elevated 0.33.  Some lateral ST segment depression is noted as well.  Currently, the patient is pain-free and feeling well.  She also had an admission in July of this year where she ruled out for MI with enzymes and ECG was normal.     PMH:   Past Medical History  Diagnosis Date  . Hx of CABG   . Hypertension   . GERD (gastroesophageal reflux disease)   . Anxiety   . Coronary artery disease   . Arthritis   . Anginal pain   . Shortness of breath      PSH:   Past Surgical History  Procedure Laterality Date  . Coronary artery bypass graft  2008  . Cardiac catheterization  2007,    2 stents  . Cardiac catheterization  2014  . Tonsillectomy    . Dilation and curettage of uterus    . Eye surgery      both cataracts  . Colonoscopy    . Upper gastrointestinal endoscopy    . Capsulotomy Right 08/04/2012    Procedure: CAPSULOTOMY;  Surgeon: Sherri Rad, MD;   Location: [facility];  Service: Orthopedics;  Laterality: Right;  RIGHT 2ND TOE METATARSOPHALANGEAL JOINT DORSAL CAPSULOTOMY   . Bunionectomy with hammertoe reconstruction Right 08/04/2012    Procedure:  HAMMERTOE RECONSTRUCTION;  Surgeon: Sherri Rad, MD;  Location: [facility];  Service: Orthopedics;  Laterality: Right;  HAMMER TOE RECONSTRUCTION PROBABLE FLEXOR DIGITORUM LONGUS TO PROXIMAL PHALANX TRANSFER LATERALIZED     Allergies:  Levaquin; Codeine; Erythromycin; Food; Macrodantin; Morphine and related; Simvastatin; Butazolidin; Ibuprofen; and Penicillins Prior to Admit Meds:   Prescriptions prior to admission  Medication Sig Dispense Refill  . ARTIFICIAL TEAR OP Apply 1 drop to eye daily as needed. For dry eyes      . aspirin 325 MG tablet Take 325 mg by mouth daily.      . Biotin 5000 MCG CAPS Take 5,000 mcg by mouth every evening.       . Calcium Carbonate (CALCIUM 600 PO) Take 600 mg by mouth daily.      Marland Kitchen co-enzyme Q-10 30 MG capsule Take 100 mg by mouth daily.      . fluticasone (FLONASE) 50 MCG/ACT nasal spray Place 2 sprays into the nose daily as needed. For allergies      . isosorbide mononitrate (  IMDUR) 30 MG 24 hr tablet Take 30 mg by mouth daily.      Marland Kitchen LORazepam (ATIVAN) 0.5 MG tablet Take 0.25 mg by mouth at bedtime.       . metoprolol tartrate (LOPRESSOR) 25 MG tablet Take 25 mg by mouth 2 (two) times daily.      . mirabegron ER (MYRBETRIQ) 50 MG TB24 Take 50 mg by mouth daily.      . Multiple Vitamin (MULITIVITAMIN WITH MINERALS) TABS Take 1 tablet by mouth daily.      Marland Kitchen NITROSTAT 0.4 MG SL tablet Place 0.4 mg under the tongue every 5 (five) minutes as needed for chest pain.       . polyethylene glycol (MIRALAX / GLYCOLAX) packet Take 17 g by mouth at bedtime.      . rosuvastatin (CRESTOR) 40 MG tablet Take 40 mg by mouth every evening.       Marland Kitchen SALINE NASAL SPRAY NA Place 3-4 sprays into the nose 2 (two) times daily as needed (for dryness).        . sertraline (ZOLOFT) 100 MG tablet Take 100 mg by mouth every morning.        Fam HX:   History reviewed. No pertinent family history. Social HX:    History   Social History  . Marital Status: Widowed    Spouse Name: N/A    Number of Children: N/A  . Years of Education: N/A   Occupational History  . Not on file.   Social History Main Topics  . Smoking status: Never Smoker   . Smokeless tobacco: Never Used  . Alcohol Use: Yes     Comment: occ  . Drug Use: No  . Sexually Active: Not on file   Other Topics Concern  . Not on file   Social History Narrative  . No narrative on file     ROS:  All 11 ROS were addressed and are negative except what is stated in the HPI  Physical Exam: Blood pressure 116/69, pulse 62, temperature 97.9 F (36.6 C), temperature source Oral, resp. rate 16, height 5\' 3"  (1.6 m), weight 48.172 kg (106 lb 3.2 oz), SpO2 94.00%.    General: Well developed, well nourished, in no acute distress Head:   Normal cephalic and atramatic  Lungs:   Clear bilaterally to auscultation and percussion. Heart:   HRRR S1 S2 , No JVD.  Abdomen: Bowel sounds are positive, abdomen soft and non-tender  Msk:   Normal strength and tone for age. Extremities:  No edema.  Neuro: Alert and oriented X 3. Psych:  Normal affect, responds appropriately    Labs:   Lab Results  Component Value Date   WBC 4.8 01/12/2013   HGB 11.3* 01/12/2013   HCT 31.9* 01/12/2013   MCV 94.7 01/12/2013   PLT 207 01/12/2013    Recent Labs Lab 01/12/13 0241  NA 136  K 3.7  CL 100  CO2 29  BUN 19  CREATININE 1.00  CALCIUM 9.7  GLUCOSE 100*   No results found for this basename: PTT   Lab Results  Component Value Date   INR 1.00 01/12/2013   INR 1.05 07/08/2012   INR 1.01 06/30/2011   Lab Results  Component Value Date   CKTOTAL 138 10/30/2010   CKMB 2.2 10/30/2010   TROPONINI 0.33* 01/12/2013     Lab Results  Component Value Date   CHOL 128 07/09/2012   CHOL  Value: 145  ATP III CLASSIFICATION:  <200     mg/dL   Desirable  161-096  mg/dL   Borderline High  >=045    mg/dL   High        09/15/8117   CHOL  Value: 133        ATP III CLASSIFICATION:  <200     mg/dL   Desirable  147-829  mg/dL   Borderline High  >=562    mg/dL   High        13/0/8657   Lab Results  Component Value Date   HDL 73 07/09/2012   HDL 72 8/46/9629   HDL 66 52/01/4131   Lab Results  Component Value Date   LDLCALC 51 07/09/2012   LDLCALC  Value: 66        Total Cholesterol/HDL:CHD Risk Coronary Heart Disease Risk Table                     Men   Women  1/2 Average Risk   3.4   3.3  Average Risk       5.0   4.4  2 X Average Risk   9.6   7.1  3 X Average Risk  23.4   11.0        Use the calculated Patient Ratio above and the CHD Risk Table to determine the patient's CHD Risk.        ATP III CLASSIFICATION (LDL):  <100     mg/dL   Optimal  440-102  mg/dL   Near or Above                    Optimal  130-159  mg/dL   Borderline  725-366  mg/dL   High  >440     mg/dL   Very High 3/47/4259   LDLCALC  Value: 60        Total Cholesterol/HDL:CHD Risk Coronary Heart Disease Risk Table                     Men   Women  1/2 Average Risk   3.4   3.3  Average Risk       5.0   4.4  2 X Average Risk   9.6   7.1  3 X Average Risk  23.4   11.0        Use the calculated Patient Ratio above and the CHD Risk Table to determine the patient's CHD Risk.        ATP III CLASSIFICATION (LDL):  <100     mg/dL   Optimal  563-875  mg/dL   Near or Above                    Optimal  130-159  mg/dL   Borderline  643-329  mg/dL   High  >518     mg/dL   Very High 84/06/6604   Lab Results  Component Value Date   TRIG 22 07/09/2012   TRIG 33 10/01/2010   TRIG 37 03/12/2010   Lab Results  Component Value Date   CHOLHDL 1.8 07/09/2012   CHOLHDL 2.0 10/01/2010   CHOLHDL 2.0 03/12/2010   No results found for this basename: LDLDIRECT      Radiology:  Dg Chest Port 1 View  01/12/2013   *RADIOLOGY REPORT*  Clinical Data: Chest pain and  shortness of breath.  PORTABLE CHEST - 1 VIEW  Comparison: CT chest 09/19/2009 and plain film  chest 12/23/2012.  Findings: The lungs are clear.  Heart size is upper normal.  The patient is status post CABG.  No pneumothorax or pleural fluid.  IMPRESSION: No acute disease.   Original Report Authenticated By: Holley Dexter, M.D.    EKG:  Normal sinus rhythm, lateral ST segment depressions which are downsloping  ASSESSMENT: Possible Unstable angina, known coronary artery disease  PLAN:  Minimally elevated troponin.  Agree with cycling enzymes.  Given her recent cath which did not show need for revascularization, I would have a higher threshold than usual to recommend cardiac cath.  Will discuss the case with Dr. Katrinka Blazing.  If her subsequent enzymes returned to normal, would consider just intensifying medical therapy.  Okay to hold off on heparin at this point until we see the results of further blood tests.  I personally reviewed her cardiac cath films from January of 2014.  Some mild branch vessel disease is noted but there were no good targets for revascularization at that time.  LV function was preserved as well at that time.  Consider echocardiogram to look for any wall motion abnormalities if enzymes tend to trend up.  Corky Crafts., MD  01/12/2013  1:00 PM

## 2013-01-13 DIAGNOSIS — I200 Unstable angina: Secondary | ICD-10-CM | POA: Diagnosis present

## 2013-01-13 DIAGNOSIS — K219 Gastro-esophageal reflux disease without esophagitis: Secondary | ICD-10-CM | POA: Diagnosis present

## 2013-01-13 DIAGNOSIS — I10 Essential (primary) hypertension: Secondary | ICD-10-CM

## 2013-01-13 MED ORDER — PANTOPRAZOLE SODIUM 40 MG PO TBEC
40.0000 mg | DELAYED_RELEASE_TABLET | Freq: Two times a day (BID) | ORAL | Status: DC
  Administered 2013-01-13 – 2013-01-14 (×2): 40 mg via ORAL
  Filled 2013-01-13 (×2): qty 1

## 2013-01-13 MED ORDER — GI COCKTAIL ~~LOC~~
30.0000 mL | Freq: Once | ORAL | Status: DC
  Filled 2013-01-13: qty 30

## 2013-01-13 MED ORDER — GI COCKTAIL ~~LOC~~
30.0000 mL | Freq: Three times a day (TID) | ORAL | Status: DC | PRN
  Administered 2013-01-13: 30 mL via ORAL
  Filled 2013-01-13: qty 30

## 2013-01-13 NOTE — Progress Notes (Signed)
Discussed with RN and Dr. Katrinka Blazing  [facility] PROGRESS NOTE  [name] ZOX:[id number] DOB: [birth year] DOA: 01/12/2013 PCP: Ginette Otto, MD  Assessment/Plan:    Unstable angina: Per Dr. Katrinka Blazing, patient has chronic complaints of chest pain. Repeat cardiac catheterization may not add much at this point. Will start diet. Dr. Katrinka Blazing to evaluate and adjust cardiac medications as indicated. Patient had an episode of chest tightness today than they didn't her abdomen. Relief with nitroglycerin. EKG unchanged from yesterday. Peak troponin 0.3. D-dimer negative, ruling out PE. Had EGD in June which reportedly was normal. Continue twice a day PPI. Active Problems:   CORONARY ATHEROSCLEROSIS, ARTERY BYPASS GRAFT   HYPERTENSION: With borderline hypotension last night requiring fluid bolus. Blood pressure this morning is not low.   GERD (gastroesophageal reflux disease): Twice a day PPI. Patient reports a protime next is not "work for her", that she usually takes AcipHex.  Code Status: DO NOT RESUSCITATE Family Communication: Granddaughter at bedside Disposition Plan: Back to ALF once medically stable.  Consultants:  [facility]  Procedures:  Antibiotics:    HPI/Subjective: Chest tightness this morning. Occurred in the upper chest then made down to the abdomen. Relieved by nitroglycerin. Had shortness of breath with it.  Objective: Filed Vitals:   01/13/13 0926  BP: 142/60  Pulse: 55  Temp:   Resp:     Intake/Output Summary (Last 24 hours) at 01/13/13 1103 Last data filed at 01/13/13 0800  Gross per 24 hour  Intake    720 ml  Output    450 ml  Net    270 ml   Filed Weights   01/12/13 0601  Weight: 48.172 kg (106 lb 3.2 oz)    Exam:   General:  Comfortable. Sitting up in bed talking to her granddaughter.  Cardiovascular: Regular rate rhythm without murmurs gallops rubs  Respiratory: Lungs clear to auscultation bilaterally without wheezes rhonchi or  rales  Abdomen: Soft nontender nondistended  Musculoskeletal: No chest wall tenderness  Extremities: No calf tenderness no pitting edema  Data Reviewed: Basic Metabolic Panel:  Recent Labs Lab 01/12/13 0241  NA 136  K 3.7  CL 100  CO2 29  GLUCOSE 100*  BUN 19  CREATININE 1.00  CALCIUM 9.7   Liver Function Tests: No results found for this basename: AST, ALT, ALKPHOS, BILITOT, PROT, ALBUMIN,  in the last 168 hours  Recent Labs Lab 01/12/13 0745  LIPASE 51   No results found for this basename: AMMONIA,  in the last 168 hours CBC:  Recent Labs Lab 01/12/13 0241  WBC 4.8  NEUTROABS 2.8  HGB 11.3*  HCT 31.9*  MCV 94.7  PLT 207   Cardiac Enzymes:  Recent Labs Lab 01/12/13 0745 01/12/13 1710 01/12/13 2258  CKTOTAL  --   --  168  CKMB  --   --  2.9  TROPONINI 0.33* 0.31* <0.30   BNP (last 3 results) No results found for this basename: PROBNP,  in the last 8760 hours CBG: No results found for this basename: GLUCAP,  in the last 168 hours  No results found for this or any previous visit (from the past 240 hour(s)).   Studies: Dg Chest Port 1 View  01/12/2013   *RADIOLOGY REPORT*  Clinical Data: Chest pain and shortness of breath.  PORTABLE CHEST - 1 VIEW  Comparison: CT chest 09/19/2009 and plain film chest 12/23/2012.  Findings: The lungs are clear.  Heart size is upper normal.  The patient is status post  CABG.  No pneumothorax or pleural fluid.  IMPRESSION: No acute disease.   Original Report Authenticated By: Holley Dexter, M.D.   EKG shows normal sinus rhythm and unchanged lateral T-wave changes from yesterday  Scheduled Meds: . aspirin  325 mg Oral Daily  . gi cocktail  30 mL Oral Once  . heparin  5,000 Units Subcutaneous Q8H  . isosorbide mononitrate  30 mg Oral Daily  . LORazepam  0.25 mg Oral QHS  . metoprolol tartrate  25 mg Oral BID  . mirabegron ER  50 mg Oral Daily  . pantoprazole  40 mg Oral BID  . rosuvastatin  40 mg Oral q1800  .  sertraline  100 mg Oral q morning - 10a  . sodium chloride  3 mL Intravenous Q12H   Continuous Infusions:   Time spent: 35  Averyana Pillars L  [facility] Pager [phone]. If 7PM-7AM, please contact night-coverage at [email or web address], password [facility] 01/13/2013, 11:03 AM  LOS: 1 day

## 2013-01-13 NOTE — Progress Notes (Signed)
MD (Dr. Katrinka Blazing) was  notified about patients complaints of tight/indegestion feeling in the chest. Dr. Katrinka Blazing arrived to assess and speak with the patient. Patient did state that she is now comfortable and feeling better. Will continue to monitor the patient closely.

## 2013-01-13 NOTE — Progress Notes (Signed)
Patient complained of "Tigntess" in chest. Patient states that feeling is "hard to describe"  But is similar to "idegestion" feelling. MD made aware and ordered US to give 1 nitro tablet and do an EKG. After administering the Nitro patients states that it feels like its "moving down, below her breast" a "settling feeling".  "Unsettled" is the only word she states she can describe the feeling as. Will continue to monitor the patient closely.

## 2013-01-13 NOTE — Progress Notes (Signed)
Patient Name: [name] Date of Encounter: 01/13/2013    SUBJECTIVE: The patient is pain free at time of exam(10:15 A) but had chest pain off and on thru the night. The most recent episode occurred earlier this AM and was associated with the pain moving from the chest to pelvis. She is currently pain feree and denies dyspnea and other cardiac complaints.  She admits to heavy stress at home and also states that the discomfort is more likely to occur at night when she tries to calm down enough to go to sleep.She has had it every night this week. NTG works on occasion. She can't remember if the discomfort is similar to January 2014 when she presented with pain and elevated troponin . Cath did not demonstrate high grade disease that required action or that could account for the presentation.abnormality.   TELEMETRY:   NSR and SB: Filed Vitals:   01/13/13 0453 01/13/13 0926 01/13/13 1303 01/13/13 1430  BP: 151/58 142/60 115/46 125/56  Pulse: 59 55  57  Temp: 97.7 F (36.5 C)     TempSrc: Oral   Oral  Resp: 16   17  Height:      Weight:      SpO2: 98%   97%    Intake/Output Summary (Last 24 hours) at 01/13/13 1616 Last data filed at 01/13/13 1300  Gross per 24 hour  Intake    720 ml  Output    650 ml  Net     70 ml    LABS: Basic Metabolic Panel:  Recent Labs  40/98/11 0241  NA 136  K 3.7  CL 100  CO2 29  GLUCOSE 100*  BUN 19  CREATININE 1.00  CALCIUM 9.7   CBC:  Recent Labs  01/12/13 0241  WBC 4.8  NEUTROABS 2.8  HGB 11.3*  HCT 31.9*  MCV 94.7  PLT 207   Cardiac Enzymes:  Recent Labs  01/12/13 0745 01/12/13 1710 01/12/13 2258  CKTOTAL  --   --  168  CKMB  --   --  2.9  TROPONINI 0.33* 0.31* <0.30   ECG: normal sinus with no acute or ischemic abnormality  Radiology/Studies:  Unremarkable   Physical Exam: Blood pressure 125/56, pulse 57, temperature 97.7 F (36.5 C), temperature source Oral, resp. rate 17, height 5\' 3"  (1.6 m), weight 48.172 kg  (106 lb 3.2 oz), SpO2 97.00%. Weight change:    !/6 systolic murmur. Chest is clear Extremities reveal no edema. Neuro is unremarkable but affect is bland.  ASSESSMENT:  1. Chest pain of uncertain etiology. Possibly cardiac related to coronary artery vasoconstriction related to stress. Could also be related to or other source. .  2. Possible depression  3. Coronary artery disease with prior coronary artery bypass and cath 06/2012 demonstrating no significant obstruction to account for elevated cardiac markers or chest pain at rest.   Plan:  1. Clinical observation and conservative medical management. No invasive or further ischemic work up is planned. 2. Increase the isosorbide to 60 mg daily 3. Consider stress/psychogenic etiology Signed, Lesleigh Noe 01/13/2013, 4:16 PM

## 2013-01-14 DIAGNOSIS — F329 Major depressive disorder, single episode, unspecified: Secondary | ICD-10-CM

## 2013-01-14 DIAGNOSIS — F411 Generalized anxiety disorder: Secondary | ICD-10-CM

## 2013-01-14 MED ORDER — RABEPRAZOLE SODIUM 20 MG PO TBEC: 20.0000 mg | DELAYED_RELEASE_TABLET | Freq: Every day | ORAL | Status: DC

## 2013-01-14 MED ORDER — LORAZEPAM 0.5 MG PO TABS: 0.5000 mg | ORAL_TABLET | Freq: Every day | ORAL | Status: DC

## 2013-01-14 MED ORDER — DULOXETINE HCL 30 MG PO CPEP: ORAL_CAPSULE | ORAL | Status: DC

## 2013-01-14 MED ORDER — SERTRALINE HCL 100 MG PO TABS: ORAL_TABLET | ORAL | Status: DC

## 2013-01-14 MED ORDER — ISOSORBIDE MONONITRATE ER 60 MG PO TB24: 60.0000 mg | ORAL_TABLET | Freq: Every day | ORAL | Status: DC

## 2013-01-14 NOTE — Discharge Summary (Signed)
Physician Discharge Summary  [name] ZOX:[id number] DOB: 1931/07/08 DOA: 01/12/2013  PCP: Ginette Otto, MD  Admit date: 01/12/2013 Discharge date: 01/14/2013  Time spent: 45 minutes  Recommendations for Outpatient Follow-up:  Monitor anxiety, depression, chest pain symptoms, blood pressure on new dose of Imdur or, and transitioned to Cymbalta from Zoloft. Discharge Diagnoses:   Recurrent chest pain: Angina, versus reflux, versus anxiety/depression related    CORONARY ATHEROSCLEROSIS, ARTERY BYPASS GRAFT    HYPERTENSION    GERD (gastroesophageal reflux disease)  Depression and anxiety  Discharge Condition: Stable   Filed Weights   01/12/13 0601  Weight: 48.172 kg (106 lb 3.2 oz)    History and Hospital course  The patient is a 79 year old white female with history of heart disease, reflux, depression anxiety and chronic recurrent chest pain. She presented with worsening of her chest pain pattern in terms of severity and frequency. She is followed by Dr. Garnette Scheuermann. She was admitted to the hospitalist service. Troponins peaked at 0.3. The patient continued to have frequent chest pain, sometimes responsive to nitroglycerin, sometimes responsive to GI cocktail.  Cardiology was consulted. Patient was catheterized in January. Dr. Katrinka Blazing recommends increasing and/or to 60 mg a day, but is not convinced this is cardiac chest pain. Could be gastroesophageal reflux or anxiety/depression. Patient does admit to feelings of anxiety, particularly at night. She had a recent upper endoscopy in June which was reportedly unremarkable. She takes AcipHex as an outpatient. She has been on Zoloft for quite some time and does not feel it is helping her depression and anxiety. She wishes to give Cymbalta a try after discussion. I recommend tapering off her Zoloft and starting Cymbalta at 30 mg a week and tapering up as tolerated. I've also recommended increasing her nightly Ativan to 0.5 mg  rather than 0.25 mg. She is willing to give this a try and will followup with Dr. Pete Glatter as an outpatient to monitor symptoms.  On admission, she came with a out of facility DO NOT RESUSCITATE form. She had multiple questions about this and after further discussion, she does not want to be DO NOT RESUSCITATE. She would like to be full resuscitation unless she is suffering from terminal illness, or in a persistent vegetative state. I encouraged her to discuss this further with Dr. Pete Glatter if she has any other questions. I have changed her CODE STATUS full code. She is been cleared by cardiology for discharge and feels ready to go.  Procedures:  None  Consultations:  [facility]  Discharge Exam: Filed Vitals:   01/14/13 0448  BP: 142/59  Pulse: 55  Temp: 97.9 F (36.6 C)  Resp: 18    General:  Flat affect. apprehensive. Husband currently in the room.  Cardiovascular: Regular rate rhythm without murmurs gallops rubs  Respiratory: Clear to auscultation bilaterally without wheezes rhonchi or rales  Discharge Instructions  Discharge Orders   Future Appointments Provider Department Dept Phone   03/15/2013 2:00 PM Windell Hummingbird [facility] [phone]   03/22/2013 3:00 PM Ladene Artist, MD [facility] [phone]   Future Orders Complete By Expires     Activity as tolerated - No restrictions  As directed     Diet - low sodium heart healthy  As directed         Medication List         ARTIFICIAL TEAR OP  Apply 1 drop to eye daily as needed. For dry  eyes     aspirin 325 MG tablet  Take 325 mg by mouth daily.     Biotin 5000 MCG Caps  Take 5,000 mcg by mouth every evening.     CALCIUM 600 PO  Take 600 mg by mouth daily.     co-enzyme Q-10 30 MG capsule  Take 100 mg by mouth daily.     DULoxetine 30 MG capsule  Commonly known as:  CYMBALTA  1 tablet daily for a week then 2 tablets daily.      fluticasone 50 MCG/ACT nasal spray  Commonly known as:  FLONASE  Place 2 sprays into the nose daily as needed. For allergies     isosorbide mononitrate 60 MG 24 hr tablet  Commonly known as:  IMDUR  Take 1 tablet (60 mg total) by mouth daily.     LORazepam 0.5 MG tablet  Commonly known as:  ATIVAN  Take 1 tablet (0.5 mg total) by mouth at bedtime.     metoprolol tartrate 25 MG tablet  Commonly known as:  LOPRESSOR  Take 25 mg by mouth 2 (two) times daily.     multivitamin with minerals Tabs tablet  Take 1 tablet by mouth daily.     MYRBETRIQ 50 MG Tb24 tablet  Generic drug:  mirabegron ER  Take 50 mg by mouth daily.     NITROSTAT 0.4 MG SL tablet  Generic drug:  nitroGLYCERIN  Place 0.4 mg under the tongue every 5 (five) minutes as needed for chest pain.     polyethylene glycol packet  Commonly known as:  MIRALAX / GLYCOLAX  Take 17 g by mouth at bedtime.     RABEprazole 20 MG tablet  Commonly known as:  ACIPHEX  Take 1 tablet (20 mg total) by mouth daily. As previous     rosuvastatin 40 MG tablet  Commonly known as:  CRESTOR  Take 40 mg by mouth every evening.     SALINE NASAL SPRAY NA  Place 3-4 sprays into the nose 2 (two) times daily as needed (for dryness).     sertraline 100 MG tablet  Commonly known as:  ZOLOFT  Tapered to half tablet daily for a week, then half a tablet every other day for a week, then stop.       Allergies  Allergen Reactions  . Levaquin (Levofloxacin) Shortness Of Breath  . Codeine Nausea Only  . Erythromycin Nausea And Vomiting    All "mycin" drugs  . Food Other (See Comments)    No spicy foods or black pepper, raw onions - causes gerd  . Macrodantin Other (See Comments)    Double vision  . Morphine And Related Other (See Comments)    hallucinations  . Simvastatin Other (See Comments)    Muscle aches  . Butazolidin (Phenylbutazone) Swelling and Rash  . Ibuprofen Hives and Rash  . Penicillins Rash       Follow-up  Information   Follow up with Ginette Otto, MD In 1 week.   Contact information:   [address] [place] [address] [phone]      EKG shows normal sinus rhythm with lateral flipped T waves.   The results of significant diagnostics from this hospitalization (including imaging, microbiology, ancillary and laboratory) are listed below for reference.    Significant Diagnostic Studies: Dg Chest Port 1 View  01/12/2013   *RADIOLOGY REPORT*  Clinical Data: Chest pain and shortness of breath.  PORTABLE CHEST - 1 VIEW  Comparison:  CT chest 09/19/2009 and plain film chest 12/23/2012.  Findings: The lungs are clear.  Heart size is upper normal.  The patient is status post CABG.  No pneumothorax or pleural fluid.  IMPRESSION: No acute disease.   Original Report Authenticated By: Holley Dexter, M.D.   Dg Chest Portable 1 View  12/23/2012   *RADIOLOGY REPORT*  Clinical Data: Chest pain.  PORTABLE CHEST - 1 VIEW  Comparison: Chest radiograph performed 06/30/2011, and CT of the chest performed 09/19/2009  Findings: The lungs are well-aerated.  There is no evidence of focal opacification, pleural effusion or pneumothorax.  Known pulmonary nodules are less well as on radiograph.  The cardiomediastinal silhouette is normal in size; the patient is status post median sternotomy, with evidence of prior CABG.  No acute osseous abnormalities are seen.  IMPRESSION: No acute cardiopulmonary process seen.   Original Report Authenticated By: Tonia Ghent, M.D.    Microbiology: No results found for this or any previous visit (from the past 240 hour(s)).   Labs: Basic Metabolic Panel:  Recent Labs Lab 01/12/13 0241  NA 136  K 3.7  CL 100  CO2 29  GLUCOSE 100*  BUN 19  CREATININE 1.00  CALCIUM 9.7   Liver Function Tests: No results found for this basename: AST, ALT, ALKPHOS, BILITOT, PROT, ALBUMIN,  in the last 168 hours  Recent Labs Lab 01/12/13 0745  LIPASE 51   No  results found for this basename: AMMONIA,  in the last 168 hours CBC:  Recent Labs Lab 01/12/13 0241  WBC 4.8  NEUTROABS 2.8  HGB 11.3*  HCT 31.9*  MCV 94.7  PLT 207   Cardiac Enzymes:  Recent Labs Lab 01/12/13 0745 01/12/13 1710 01/12/13 2258  CKTOTAL  --   --  168  CKMB  --   --  2.9  TROPONINI 0.33* 0.31* <0.30   BNP: BNP (last 3 results) No results found for this basename: PROBNP,  in the last 8760 hours CBG: No results found for this basename: GLUCAP,  in the last 168 hours  Signed:  Skai Lickteig L  [facility] 01/14/2013, 10:34 AM

## 2013-01-14 NOTE — Progress Notes (Addendum)
Patient Name: [name] Date of Encounter: 01/14/2013    SUBJECTIVE: Again the patient had mild recurrence of chest discomfort that awakened her from sleep early this morning relieved with one sublingual nitroglycerin. She does not have exertional chest discomfort.  TELEMETRY:  Sinus rhythm and sinus bradycardia Filed Vitals:   01/13/13 1303 01/13/13 1430 01/13/13 2029 01/14/13 0448  BP: 115/46 125/56 107/40 142/59  Pulse:  57 57 55  Temp:   97.9 F (36.6 C) 97.9 F (36.6 C)  TempSrc:  Oral Oral Oral  Resp:  17 18 18   Height:      Weight:      SpO2:  97% 98% 97%    Intake/Output Summary (Last 24 hours) at 01/14/13 0812 Last data filed at 01/14/13 0449  Gross per 24 hour  Intake    840 ml  Output   1300 ml  Net   -460 ml    LABS: Basic Metabolic Panel:  Recent Labs  16/10/96 0241  NA 136  K 3.7  CL 100  CO2 29  GLUCOSE 100*  BUN 19  CREATININE 1.00  CALCIUM 9.7   CBC:  Recent Labs  01/12/13 0241  WBC 4.8  NEUTROABS 2.8  HGB 11.3*  HCT 31.9*  MCV 94.7  PLT 207   Cardiac Enzymes:  Recent Labs  01/12/13 0745 01/12/13 1710 01/12/13 2258  CKTOTAL  --   --  168  CKMB  --   --  2.9  TROPONINI 0.33* 0.31* <0.30     Radiology/Studies:  No new data  Physical Exam: Blood pressure 142/59, pulse 55, temperature 97.9 F (36.6 C), temperature source Oral, resp. rate 18, height 5\' 3"  (1.6 m), weight 48.172 kg (106 lb 3.2 oz), SpO2 97.00%. Weight change:    No change from prior  ASSESSMENT:  1. Recurring chest discomfort at rest, responsive to nitroglycerin, and without evidence of myocardial infarction. At worst she is having coronary vasoconstriction, possibly stress/sleep apnea/idiopathic. If nitroglycerin works, she should use it as needed to control pain. As long as the discomfort resolves, no particular additional workup should be necessary. I am still not totally convinced that this is even cardiac and there could still be a GI,  musculoskeletal, noncardiac component  Plan:  1. I have permanently increased isosorbide mononitrate to 60 mg per day.  2. It is okay to discharge the patient from my point of view. Please copy Dr. Kinnie Scales  3. Long conversation including counseling and instruction .  Selinda Eon 01/14/2013, 8:12 AM

## 2013-01-22 ENCOUNTER — Observation Stay (HOSPITAL_COMMUNITY)
Admission: EM | Admit: 2013-01-22 | Discharge: 2013-01-22 | Disposition: A | Discharge status: Home / Self Care | Payer: Medicare Other | Attending: Internal Medicine | Admitting: Internal Medicine

## 2013-01-22 ENCOUNTER — Encounter (HOSPITAL_COMMUNITY): Payer: Self-pay | Admitting: Emergency Medicine

## 2013-01-22 ENCOUNTER — Emergency Department (HOSPITAL_COMMUNITY): Payer: Medicare Other

## 2013-01-22 DIAGNOSIS — I2510 Atherosclerotic heart disease of native coronary artery without angina pectoris: Secondary | ICD-10-CM | POA: Insufficient documentation

## 2013-01-22 DIAGNOSIS — E785 Hyperlipidemia, unspecified: Secondary | ICD-10-CM | POA: Diagnosis present

## 2013-01-22 DIAGNOSIS — R079 Chest pain, unspecified: Principal | ICD-10-CM | POA: Diagnosis present

## 2013-01-22 DIAGNOSIS — F3289 Other specified depressive episodes: Secondary | ICD-10-CM | POA: Insufficient documentation

## 2013-01-22 DIAGNOSIS — I25810 Atherosclerosis of coronary artery bypass graft(s) without angina pectoris: Secondary | ICD-10-CM

## 2013-01-22 DIAGNOSIS — F329 Major depressive disorder, single episode, unspecified: Secondary | ICD-10-CM | POA: Insufficient documentation

## 2013-01-22 DIAGNOSIS — Z951 Presence of aortocoronary bypass graft: Secondary | ICD-10-CM | POA: Insufficient documentation

## 2013-01-22 DIAGNOSIS — Z79899 Other long term (current) drug therapy: Secondary | ICD-10-CM | POA: Insufficient documentation

## 2013-01-22 DIAGNOSIS — I10 Essential (primary) hypertension: Secondary | ICD-10-CM | POA: Diagnosis present

## 2013-01-22 LAB — CBC WITH DIFFERENTIAL/PLATELET
HCT: 31.8 % — ABNORMAL LOW (ref 36.0–46.0)
Hemoglobin: 11.2 g/dL — ABNORMAL LOW (ref 12.0–15.0)
Lymphocytes Relative: 23 % (ref 12–46)
Monocytes Absolute: 0.4 10*3/uL (ref 0.1–1.0)
Monocytes Relative: 9 % (ref 3–12)
Neutro Abs: 3 10*3/uL (ref 1.7–7.7)
Neutrophils Relative %: 65 % (ref 43–77)
RBC: 3.36 MIL/uL — ABNORMAL LOW (ref 3.87–5.11)
WBC: 4.6 10*3/uL (ref 4.0–10.5)

## 2013-01-22 LAB — COMPREHENSIVE METABOLIC PANEL
BUN: 19 mg/dL (ref 6–23)
Calcium: 9.5 mg/dL (ref 8.4–10.5)
Creatinine, Ser: 0.85 mg/dL (ref 0.50–1.10)
GFR calc Af Amer: 74 mL/min — ABNORMAL LOW (ref >90)
Glucose, Bld: 142 mg/dL — ABNORMAL HIGH (ref 70–99)
Sodium: 135 mEq/L (ref 135–145)
Total Protein: 7.6 g/dL (ref 6.0–8.3)

## 2013-01-22 LAB — CBC
HCT: 31.9 % — ABNORMAL LOW (ref 36.0–46.0)
Hemoglobin: 11.4 g/dL — ABNORMAL LOW (ref 12.0–15.0)
MCH: 33.8 pg (ref 26.0–34.0)
MCHC: 35.7 g/dL (ref 30.0–36.0)
RDW: 14.1 % (ref 11.5–15.5)

## 2013-01-22 LAB — POCT I-STAT TROPONIN I: Troponin i, poc: 0.02 ng/mL (ref 0.00–0.08)

## 2013-01-22 LAB — BASIC METABOLIC PANEL WITH GFR
BUN: 18 mg/dL (ref 6–23)
CO2: 29 meq/L (ref 19–32)
Calcium: 9.7 mg/dL (ref 8.4–10.5)
Chloride: 99 meq/L (ref 96–112)
Creatinine, Ser: 0.84 mg/dL (ref 0.50–1.10)
GFR calc Af Amer: 75 mL/min — ABNORMAL LOW
GFR calc non Af Amer: 64 mL/min — ABNORMAL LOW
Glucose, Bld: 99 mg/dL (ref 70–99)
Potassium: 4.2 meq/L (ref 3.5–5.1)
Sodium: 137 meq/L (ref 135–145)

## 2013-01-22 MED ORDER — NITROGLYCERIN 2 % TD OINT
1.0000 [in_us] | TOPICAL_OINTMENT | Freq: Once | TRANSDERMAL | Status: AC
  Administered 2013-01-22: 1 [in_us] via TOPICAL
  Filled 2013-01-22: qty 1

## 2013-01-22 MED ORDER — ROSUVASTATIN CALCIUM 40 MG PO TABS
40.0000 mg | ORAL_TABLET | Freq: Every day | ORAL | Status: DC
  Filled 2013-01-22: qty 1

## 2013-01-22 MED ORDER — LORAZEPAM 0.5 MG PO TABS: 0.5000 mg | ORAL_TABLET | Freq: Every day | ORAL | Status: DC

## 2013-01-22 MED ORDER — SODIUM CHLORIDE 0.9 % IJ SOLN: 3.0000 mL | Freq: Two times a day (BID) | INTRAMUSCULAR | Status: DC

## 2013-01-22 MED ORDER — ACETAMINOPHEN 325 MG PO TABS: 650.0000 mg | ORAL_TABLET | Freq: Four times a day (QID) | ORAL | Status: DC | PRN

## 2013-01-22 MED ORDER — ACETAMINOPHEN 650 MG RE SUPP: 650.0000 mg | Freq: Four times a day (QID) | RECTAL | Status: DC | PRN

## 2013-01-22 MED ORDER — METOPROLOL TARTRATE 25 MG PO TABS
25.0000 mg | ORAL_TABLET | Freq: Two times a day (BID) | ORAL | Status: DC
  Administered 2013-01-22: 25 mg via ORAL
  Filled 2013-01-22 (×2): qty 1

## 2013-01-22 MED ORDER — PANTOPRAZOLE SODIUM 40 MG PO TBEC
40.0000 mg | DELAYED_RELEASE_TABLET | Freq: Every day | ORAL | Status: DC
  Administered 2013-01-22: 40 mg via ORAL
  Filled 2013-01-22: qty 1

## 2013-01-22 MED ORDER — ENOXAPARIN SODIUM 40 MG/0.4ML ~~LOC~~ SOLN
40.0000 mg | SUBCUTANEOUS | Status: DC
  Administered 2013-01-22: 40 mg via SUBCUTANEOUS
  Filled 2013-01-22: qty 0.4

## 2013-01-22 MED ORDER — ASPIRIN 325 MG PO TABS
325.0000 mg | ORAL_TABLET | Freq: Every day | ORAL | Status: DC
  Administered 2013-01-22: 325 mg via ORAL
  Filled 2013-01-22: qty 1

## 2013-01-22 MED ORDER — MIRABEGRON ER 50 MG PO TB24
50.0000 mg | ORAL_TABLET | Freq: Every day | ORAL | Status: DC
  Administered 2013-01-22: 50 mg via ORAL
  Filled 2013-01-22: qty 1

## 2013-01-22 MED ORDER — ADULT MULTIVITAMIN W/MINERALS CH
1.0000 | ORAL_TABLET | Freq: Every day | ORAL | Status: DC
  Administered 2013-01-22: 1 via ORAL
  Filled 2013-01-22: qty 1

## 2013-01-22 MED ORDER — NITROGLYCERIN 0.4 MG SL SUBL: 0.4000 mg | SUBLINGUAL_TABLET | SUBLINGUAL | Status: DC | PRN

## 2013-01-22 MED ORDER — ONDANSETRON HCL 4 MG PO TABS: 4.0000 mg | ORAL_TABLET | Freq: Four times a day (QID) | ORAL | Status: DC | PRN

## 2013-01-22 MED ORDER — ATORVASTATIN CALCIUM 80 MG PO TABS: 80.0000 mg | ORAL_TABLET | Freq: Every day | ORAL | Status: DC

## 2013-01-22 MED ORDER — FLUTICASONE PROPIONATE 50 MCG/ACT NA SUSP: 2.0000 | Freq: Every day | NASAL | Status: DC | PRN

## 2013-01-22 MED ORDER — GI COCKTAIL ~~LOC~~
30.0000 mL | Freq: Once | ORAL | Status: DC
  Filled 2013-01-22 (×2): qty 30

## 2013-01-22 MED ORDER — ONDANSETRON HCL 4 MG/2ML IJ SOLN: 4.0000 mg | Freq: Four times a day (QID) | INTRAMUSCULAR | Status: DC | PRN

## 2013-01-22 MED ORDER — POLYETHYLENE GLYCOL 3350 17 G PO PACK
17.0000 g | PACK | Freq: Every day | ORAL | Status: DC
  Filled 2013-01-22: qty 1

## 2013-01-22 MED ORDER — ISOSORBIDE MONONITRATE ER 60 MG PO TB24
60.0000 mg | ORAL_TABLET | Freq: Every day | ORAL | Status: DC
  Administered 2013-01-22: 60 mg via ORAL
  Filled 2013-01-22: qty 1

## 2013-01-22 MED ORDER — DULOXETINE HCL 60 MG PO CPEP
60.0000 mg | ORAL_CAPSULE | Freq: Every day | ORAL | Status: DC
  Administered 2013-01-22: 60 mg via ORAL
  Filled 2013-01-22: qty 1

## 2013-01-22 MED ORDER — SERTRALINE HCL 50 MG PO TABS: 50.0000 mg | ORAL_TABLET | ORAL | Status: DC

## 2013-01-22 NOTE — ED Notes (Signed)
Patient transported to X-ray 

## 2013-01-22 NOTE — ED Notes (Signed)
REPORT GIVEN TO 3 WEST UNIT NURSE , TRANSPORTED TO FLOOR IN STABLE CONDITION .

## 2013-01-22 NOTE — H&P (Signed)
[facility] History and Physical  SIRIAH TREAT WUJ:[id number] DOB: 1932/01/31 DOA: 01/22/2013  Referring physician: ER physician. PCP: Ginette Otto, MD  Specialists: Dr. Verdis Prime. Cardiologist.  Chief Complaint: Chest pain.  HPI: [name] is a 79 y.o. female with history of CAD status post CABG who was just recently admitted and discharged for chest pain and at that time cardiologist had recommended to increase the Imdur dose to 60 mg which patient states patient has been taking since presents with complaints of chest pain. Patient suspects around last night while patient had gone to the bed. The pain was piercing in nature retrosternal nonradiating lasted for half an hour and resolved after nitroglycerin. It happened at least 3 times and all the time it improved with sublingual nitroglycerin. Presently chest pain-free and cardiac markers and EKG are unremarkable and has been admitted for observation. Patient otherwise denies any nausea vomiting or diaphoresis or any shortness of breath or abdominal pain.  Review of Systems: As presented in the history of presenting illness, rest negative.  Past Medical History  Diagnosis Date  . Hx of CABG   . Hypertension   . GERD (gastroesophageal reflux disease)   . Anxiety   . Coronary artery disease   . Arthritis   . Anginal pain   . Shortness of breath   . Intermediate coronary syndrome    Past Surgical History  Procedure Laterality Date  . Coronary artery bypass graft  2008  . Cardiac catheterization  2007,    2 stents  . Cardiac catheterization  2014  . Tonsillectomy    . Dilation and curettage of uterus    . Eye surgery      both cataracts  . Colonoscopy    . Upper gastrointestinal endoscopy    . Capsulotomy Right 08/04/2012    Procedure: CAPSULOTOMY;  Surgeon: Sherri Rad, MD;  Location: [facility];  Service: Orthopedics;  Laterality: Right;  RIGHT 2ND TOE METATARSOPHALANGEAL JOINT  DORSAL CAPSULOTOMY   . Bunionectomy with hammertoe reconstruction Right 08/04/2012    Procedure:  HAMMERTOE RECONSTRUCTION;  Surgeon: Sherri Rad, MD;  Location: [facility];  Service: Orthopedics;  Laterality: Right;  HAMMER TOE RECONSTRUCTION PROBABLE FLEXOR DIGITORUM LONGUS TO PROXIMAL PHALANX TRANSFER LATERALIZED    Social History:  reports that she has never smoked. She has never used smokeless tobacco. She reports that  drinks alcohol. She reports that she does not use illicit drugs. Home. where does patient live-- Can do ADLs. Can patient participate in ADLs?  Allergies  Allergen Reactions  . Levaquin [Levofloxacin] Shortness Of Breath  . Codeine Nausea Only  . Erythromycin Nausea And Vomiting    All "mycin" drugs  . Food Other (See Comments)    No spicy foods or black pepper, raw onions - causes gerd  . Macrodantin Other (See Comments)    Double vision  . Morphine And Related Other (See Comments)    hallucinations  . Simvastatin Other (See Comments)    Muscle aches  . Butazolidin [Phenylbutazone] Swelling and Rash  . Ibuprofen Hives and Rash  . Penicillins Rash    History reviewed. No pertinent family history.    Prior to Admission medications   Medication Sig Start Date End Date Taking? Authorizing Provider  ARTIFICIAL TEAR OP Apply 1 drop to eye daily as needed. For dry eyes   Yes Historical Provider, MD  aspirin 325 MG tablet Take 325 mg by mouth daily.   Yes Historical Provider,  MD  Biotin 5000 MCG CAPS Take 5,000 mcg by mouth every evening.    Yes Historical Provider, MD  Calcium Carbonate (CALCIUM 600 PO) Take 600 mg by mouth daily.   Yes Historical Provider, MD  co-enzyme Q-10 30 MG capsule Take 100 mg by mouth daily.   Yes Historical Provider, MD  DULoxetine (CYMBALTA) 30 MG capsule 1 tablet daily for a week then 2 tablets daily. 01/14/13  Yes Christiane Ha, MD  fluticasone (FLONASE) 50 MCG/ACT nasal spray Place 2 sprays into the nose daily as  needed. For allergies   Yes Historical Provider, MD  isosorbide mononitrate (IMDUR) 60 MG 24 hr tablet Take 1 tablet (60 mg total) by mouth daily. 01/14/13  Yes Christiane Ha, MD  LORazepam (ATIVAN) 0.5 MG tablet Take 1 tablet (0.5 mg total) by mouth at bedtime. 01/14/13  Yes Christiane Ha, MD  metoprolol tartrate (LOPRESSOR) 25 MG tablet Take 25 mg by mouth 2 (two) times daily.   Yes Historical Provider, MD  mirabegron ER (MYRBETRIQ) 50 MG TB24 Take 50 mg by mouth daily.   Yes Historical Provider, MD  Multiple Vitamin (MULITIVITAMIN WITH MINERALS) TABS Take 1 tablet by mouth daily.   Yes Historical Provider, MD  NITROSTAT 0.4 MG SL tablet Place 0.4 mg under the tongue every 5 (five) minutes as needed for chest pain.  07/13/12  Yes Historical Provider, MD  polyethylene glycol (MIRALAX / GLYCOLAX) packet Take 17 g by mouth at bedtime.   Yes Historical Provider, MD  RABEprazole (ACIPHEX) 20 MG tablet Take 1 tablet (20 mg total) by mouth daily. As previous 01/14/13  Yes Christiane Ha, MD  rosuvastatin (CRESTOR) 40 MG tablet Take 40 mg by mouth every evening.    Yes Historical Provider, MD  SALINE NASAL SPRAY NA Place 3-4 sprays into the nose 2 (two) times daily as needed (for dryness).    Yes Historical Provider, MD  sertraline (ZOLOFT) 100 MG tablet Take 50 mg by mouth every other day.   Yes Historical Provider, MD   Physical Exam: Filed Vitals:   01/22/13 0344 01/22/13 0420 01/22/13 0534  BP: 141/56 116/68 154/77  Pulse: 61 61 68  Temp: 97.8 F (36.6 C) 98.7 F (37.1 C) 98.3 F (36.8 C)  TempSrc: Oral Oral Oral  Resp: 11 14 20   Height:   5\' 3"  (1.6 m)  Weight:   49.17 kg (108 lb 6.4 oz)  SpO2: 100% 99% 96%     General:  Well-developed moderately nourished.  Eyes: Anicteric no pallor.  ENT: No discharge from the ears eyes nose mouth.  Neck: No mass felt.  Cardiovascular: S1-S2 heard.  Respiratory: No rhonchi or crepitations.  Abdomen: Soft nontender bowel sounds  present.  Skin: No rash.  Musculoskeletal: No edema.  Psychiatric: Appears normal.  Neurologic: Alert awake oriented to time place and person. Moves all extremities.  Labs on Admission:  Basic Metabolic Panel:  Recent Labs Lab 01/22/13 0330  NA 137  K 4.2  CL 99  CO2 29  GLUCOSE 99  BUN 18  CREATININE 0.84  CALCIUM 9.7   Liver Function Tests: No results found for this basename: AST, ALT, ALKPHOS, BILITOT, PROT, ALBUMIN,  in the last 168 hours No results found for this basename: LIPASE, AMYLASE,  in the last 168 hours No results found for this basename: AMMONIA,  in the last 168 hours CBC:  Recent Labs Lab 01/22/13 0330  WBC 5.9  HGB 11.4*  HCT 31.9*  MCV 94.7  PLT 222   Cardiac Enzymes: No results found for this basename: CKTOTAL, CKMB, CKMBINDEX, TROPONINI,  in the last 168 hours  BNP (last 3 results) No results found for this basename: PROBNP,  in the last 8760 hours CBG: No results found for this basename: GLUCAP,  in the last 168 hours  Radiological Exams on Admission: Dg Chest 2 View  01/22/2013   *RADIOLOGY REPORT*  Clinical Data: Chest pain  CHEST - 2 VIEW  Comparison: Prior chest x-ray 01/12/2013  Findings: Stable appearance of the chest.  Negative for focal airspace consolidation, pulmonary edema, pleural effusion or pneumothorax.  Stable hyperinflation, emphysema and COPD.  Cardiac and mediastinal contours are unchanged.  Status post median sternotomy with evidence of prior CABG.  Atherosclerotic calcifications in the transverse aorta.  No acute osseous abnormality.  IMPRESSION:  1.  No acute cardiopulmonary process. 2.  COPD, emphysema and coronary artery disease.   Original Report Authenticated By: Malachy Moan, M.D.    EKG: Independently reviewed. Normal sinus rhythm.  Assessment/Plan Principal Problem:   Chest pain Active Problems:   HYPERLIPIDEMIA   HYPERTENSION   1. Chest pain history of CAD status post CABG - patient has last cardiac  catheter this January. Presently patient is chest pain-free. Cycle cardiac markers. Continue antiplatelet agents. Nitroglycerin. 2. Hypertension - continue present medications. 3. Hyperlipidemia - continue present medications. 4. Depression - continue present medications.    Code Status: Full code.  Family Communication: None.  Disposition Plan: Admit for observation.    Vi Whitesel N. [facility] Pager [phone].  If 7PM-7AM, please contact night-coverage [email or web address] Password [facility] 01/22/2013, 5:57 AM

## 2013-01-22 NOTE — Discharge Summary (Signed)
Physician Discharge Summary  [name] GLO:[id number] DOB: [birth year] DOA: 01/22/2013  PCP: Ginette Otto, MD  Admit date: 01/22/2013 Discharge date: 01/22/2013  Time spent: 20 minutes  Recommendations for Outpatient Follow-up:  Home with outpt PCP follow up Discharge Diagnoses:  Principal Problem:   Chest pain  Active Problems:   HYPERLIPIDEMIA   HYPERTENSION   Discharge Condition: Fair  Diet recommendation: cardiac  Filed Weights   01/22/13 0534  Weight: 49.17 kg (108 lb 6.4 oz)    History of present illness:  Please refer to admission H&P for details but in brief, 79 y.o. female with history of CAD status post CABG who was just recently admitted and discharged for chest pain and at that time cardiologist had recommended to increase the Imdur dose to 60 mg which patient states patient has been taking presented with chest pain.  Pain started last night when she went to bed.  The pain was piercing in nature retrosternal nonradiating lasted for half an hour and resolved after nitroglycerin. It happened at least 3 times and all the time it improved with sublingual nitroglycerin. In the ED she was  chest pain-free and initial cardiac marker and EKG were unremarkable and was admitted for observation.  Hospital Course:  Patient admitted to medical floor on telemetry. EKG was unremarkable. She did not have any further chest pain symptoms.  She is stable on telemetry. Serial troponins were negative as well. Patient symptoms appear to be likely musculoskeletal in that she has some chest pain symptoms primarily over her right side and reports moving some chair yoga at home which involved a lot of stretching and which is something she has not done previously. She was recently admitted for chest pain symptoms and medications were adjusted. Patient is clinically stable and would resume her home medication. She can be discharged with outpatient followup with her PCP and  cardiologist.  Procedures:  None  Consultations:  Non-  Discharge Exam: Filed Vitals:   01/22/13 1340  BP: 111/48  Pulse: 58  Temp: 98.5 F (36.9 C)  Resp: 16    General: Elderly female in no acute distress HEENT: No pallor, moist oral mucosa Chest: Clear to auscultation bilaterally, no added sounds CVS: Normal S1 and S2, no murmurs rub or gallop Abdomen: Soft, nontender, nondistended, bowel sounds present Extremities: Warm, no edema CNS: AAO x3   Discharge Instructions   Future Appointments Provider Department Dept Phone   03/15/2013 2:00 PM [facility] [phone]   03/22/2013 3:00 PM Ladene Artist, MD [facility] [phone]       Medication List         ARTIFICIAL TEAR OP  Apply 1 drop to eye daily as needed. For dry eyes     aspirin 325 MG tablet  Take 325 mg by mouth daily.     Biotin 5000 MCG Caps  Take 5,000 mcg by mouth every evening.     CALCIUM 600 PO  Take 600 mg by mouth daily.     co-enzyme Q-10 30 MG capsule  Take 100 mg by mouth daily.     DULoxetine 30 MG capsule  Commonly known as:  CYMBALTA  1 tablet daily for a week then 2 tablets daily.     fluticasone 50 MCG/ACT nasal spray  Commonly known as:  FLONASE  Place 2 sprays into the nose daily as needed. For allergies     isosorbide mononitrate 60 MG 24  hr tablet  Commonly known as:  IMDUR  Take 1 tablet (60 mg total) by mouth daily.     LORazepam 0.5 MG tablet  Commonly known as:  ATIVAN  Take 1 tablet (0.5 mg total) by mouth at bedtime.     metoprolol tartrate 25 MG tablet  Commonly known as:  LOPRESSOR  Take 25 mg by mouth 2 (two) times daily.     multivitamin with minerals Tabs tablet  Take 1 tablet by mouth daily.     MYRBETRIQ 50 MG Tb24 tablet  Generic drug:  mirabegron ER  Take 50 mg by mouth daily.     NITROSTAT 0.4 MG SL tablet  Generic drug:  nitroGLYCERIN  Place 0.4 mg under  the tongue every 5 (five) minutes as needed for chest pain.     polyethylene glycol packet  Commonly known as:  MIRALAX / GLYCOLAX  Take 17 g by mouth at bedtime.     RABEprazole 20 MG tablet  Commonly known as:  ACIPHEX  Take 1 tablet (20 mg total) by mouth daily. As previous     rosuvastatin 40 MG tablet  Commonly known as:  CRESTOR  Take 40 mg by mouth every evening.     SALINE NASAL SPRAY NA  Place 3-4 sprays into the nose 2 (two) times daily as needed (for dryness).     sertraline 100 MG tablet  Commonly known as:  ZOLOFT  Take 50 mg by mouth every other day.       Allergies  Allergen Reactions  . Levaquin [Levofloxacin] Shortness Of Breath  . Codeine Nausea Only  . Erythromycin Nausea And Vomiting    All "mycin" drugs  . Food Other (See Comments)    No spicy foods or black pepper, raw onions - causes gerd  . Macrodantin Other (See Comments)    Double vision  . Morphine And Related Other (See Comments)    hallucinations  . Simvastatin Other (See Comments)    Muscle aches  . Butazolidin [Phenylbutazone] Swelling and Rash  . Ibuprofen Hives and Rash  . Penicillins Rash       Follow-up Information   Follow up with Ginette Otto, MD In 1 week.   Specialty:  Internal Medicine   Contact information:   [address] [place] [address] [phone]        The results of significant diagnostics from this hospitalization (including imaging, microbiology, ancillary and laboratory) are listed below for reference.    Significant Diagnostic Studies: Dg Chest 2 View  01/22/2013   *RADIOLOGY REPORT*  Clinical Data: Chest pain  CHEST - 2 VIEW  Comparison: Prior chest x-ray 01/12/2013  Findings: Stable appearance of the chest.  Negative for focal airspace consolidation, pulmonary edema, pleural effusion or pneumothorax.  Stable hyperinflation, emphysema and COPD.  Cardiac and mediastinal contours are unchanged.  Status post median sternotomy  with evidence of prior CABG.  Atherosclerotic calcifications in the transverse aorta.  No acute osseous abnormality.  IMPRESSION:  1.  No acute cardiopulmonary process. 2.  COPD, emphysema and coronary artery disease.   Original Report Authenticated By: Malachy Moan, M.D.   Dg Chest Port 1 View  01/12/2013   *RADIOLOGY REPORT*  Clinical Data: Chest pain and shortness of breath.  PORTABLE CHEST - 1 VIEW  Comparison: CT chest 09/19/2009 and plain film chest 12/23/2012.  Findings: The lungs are clear.  Heart size is upper normal.  The patient is status post CABG.  No pneumothorax or  pleural fluid.  IMPRESSION: No acute disease.   Original Report Authenticated By: Holley Dexter, M.D.    Microbiology: No results found for this or any previous visit (from the past 240 hour(s)).   Labs: Basic Metabolic Panel:  Recent Labs Lab 01/22/13 0330 01/22/13 0916  NA 137 135  K 4.2 3.6  CL 99 99  CO2 29 28  GLUCOSE 99 142*  BUN 18 19  CREATININE 0.84 0.85  CALCIUM 9.7 9.5   Liver Function Tests:  Recent Labs Lab 01/22/13 0916  AST 38*  ALT 23  ALKPHOS 41  BILITOT 0.2*  PROT 7.6  ALBUMIN 3.4*   No results found for this basename: LIPASE, AMYLASE,  in the last 168 hours No results found for this basename: AMMONIA,  in the last 168 hours CBC:  Recent Labs Lab 01/22/13 0330 01/22/13 0916  WBC 5.9 4.6  NEUTROABS  --  3.0  HGB 11.4* 11.2*  HCT 31.9* 31.8*  MCV 94.7 94.6  PLT 222 233   Cardiac Enzymes:  Recent Labs Lab 01/22/13 0910 01/22/13 1310  TROPONINI <0.30 <0.30   BNP: BNP (last 3 results) No results found for this basename: PROBNP,  in the last 8760 hours CBG: No results found for this basename: GLUCAP,  in the last 168 hours     Signed:  Anetta Olvera  [facility] 01/22/2013, 3:06 PM

## 2013-01-22 NOTE — ED Notes (Signed)
PT. ARRIVED WITH EMS FROM HOME REPORTS INTERMITTENT CHEST PAIN FOR SEVERAL WEEKS AND THIS MORNING , TOOK 8 NTG SL AND 2 REGULAR ASA TABS PTA WITH RELIEF , STATES HISTORY OF CAD / CABG , HER CARDIOLOGIST IS DR. [facility] . NO CHEST PAIN AT ARRIVAL . DENIES SOB OR NAUSEA.

## 2013-01-22 NOTE — ED Provider Notes (Addendum)
CSN: [id number]     Arrival date & time 01/22/13  0319 History     First MD Initiated Contact with Patient 01/22/13 0340     Chief Complaint  Patient presents with  . Chest Pain   (Consider location/radiation/quality/duration/timing/severity/associated sxs/prior Treatment) Patient is a 79 y.o. female presenting with chest pain. The history is provided by the patient.  Chest Pain She had onset at about 10:30 PM of an anterior chest pain. She has difficulty describing the pain but states it was hard. She rated the pain at 5/10. Nothing made it worse. Pain went away with 3 nitroglycerin but then recurred about an hour later. That pain again got better with 3 nitroglycerin and recurred an hour later again. She has been pain-free since about 2 AM. There is no associated dyspnea, nausea, diaphoresis. There is no radiation of pain. She does have history of coronary artery disease with him bypass and coronary stents. She had been admitted about 10 days ago with similar symptoms. She took aspirin 650 mg at home prior to coming to the ED.  Past Medical History  Diagnosis Date  . Hx of CABG   . Hypertension   . GERD (gastroesophageal reflux disease)   . Anxiety   . Coronary artery disease   . Arthritis   . Anginal pain   . Shortness of breath   . Intermediate coronary syndrome    Past Surgical History  Procedure Laterality Date  . Coronary artery bypass graft  2008  . Cardiac catheterization  2007,    2 stents  . Cardiac catheterization  2014  . Tonsillectomy    . Dilation and curettage of uterus    . Eye surgery      both cataracts  . Colonoscopy    . Upper gastrointestinal endoscopy    . Capsulotomy Right 08/04/2012    Procedure: CAPSULOTOMY;  Surgeon: Sherri Rad, MD;  Location: [facility];  Service: Orthopedics;  Laterality: Right;  RIGHT 2ND TOE METATARSOPHALANGEAL JOINT DORSAL CAPSULOTOMY   . Bunionectomy with hammertoe reconstruction Right 08/04/2012   Procedure:  HAMMERTOE RECONSTRUCTION;  Surgeon: Sherri Rad, MD;  Location: [facility];  Service: Orthopedics;  Laterality: Right;  HAMMER TOE RECONSTRUCTION PROBABLE FLEXOR DIGITORUM LONGUS TO PROXIMAL PHALANX TRANSFER LATERALIZED    No family history on file. History  Substance Use Topics  . Smoking status: Never Smoker   . Smokeless tobacco: Never Used  . Alcohol Use: Yes     Comment: occ   OB History   Grav Para Term Preterm Abortions TAB SAB Ect Mult Living                 Review of Systems  Cardiovascular: Positive for chest pain.  All other systems reviewed and are negative.    Allergies  Levaquin; Codeine; Erythromycin; Food; Macrodantin; Morphine and related; Simvastatin; Butazolidin; Ibuprofen; and Penicillins  Home Medications   Current Outpatient Rx  Name  Route  Sig  Dispense  Refill  . ARTIFICIAL TEAR OP   Ophthalmic   Apply 1 drop to eye daily as needed. For dry eyes         . aspirin 325 MG tablet   Oral   Take 325 mg by mouth daily.         . Biotin 5000 MCG CAPS   Oral   Take 5,000 mcg by mouth every evening.          . Calcium Carbonate (CALCIUM 600 PO)  Oral   Take 600 mg by mouth daily.         Marland Kitchen co-enzyme Q-10 30 MG capsule   Oral   Take 100 mg by mouth daily.         . DULoxetine (CYMBALTA) 30 MG capsule      1 tablet daily for a week then 2 tablets daily.   60 capsule   0   . fluticasone (FLONASE) 50 MCG/ACT nasal spray   Nasal   Place 2 sprays into the nose daily as needed. For allergies         . isosorbide mononitrate (IMDUR) 60 MG 24 hr tablet   Oral   Take 1 tablet (60 mg total) by mouth daily.   30 tablet   0   . LORazepam (ATIVAN) 0.5 MG tablet   Oral   Take 1 tablet (0.5 mg total) by mouth at bedtime.   30 tablet   0   . metoprolol tartrate (LOPRESSOR) 25 MG tablet   Oral   Take 25 mg by mouth 2 (two) times daily.         . mirabegron ER (MYRBETRIQ) 50 MG TB24   Oral   Take 50  mg by mouth daily.         . Multiple Vitamin (MULITIVITAMIN WITH MINERALS) TABS   Oral   Take 1 tablet by mouth daily.         Marland Kitchen NITROSTAT 0.4 MG SL tablet   Sublingual   Place 0.4 mg under the tongue every 5 (five) minutes as needed for chest pain.          . polyethylene glycol (MIRALAX / GLYCOLAX) packet   Oral   Take 17 g by mouth at bedtime.         . RABEprazole (ACIPHEX) 20 MG tablet   Oral   Take 1 tablet (20 mg total) by mouth daily. As previous         . rosuvastatin (CRESTOR) 40 MG tablet   Oral   Take 40 mg by mouth every evening.          Marland Kitchen SALINE NASAL SPRAY NA   Nasal   Place 3-4 sprays into the nose 2 (two) times daily as needed (for dryness).          . sertraline (ZOLOFT) 100 MG tablet      Tapered to half tablet daily for a week, then half a tablet every other day for a week, then stop.          BP 141/56  Pulse 61  Temp(Src) 97.8 F (36.6 C) (Oral)  Resp 11  SpO2 100% Physical Exam  Nursing note and vitals reviewed.  79 year old female, resting comfortably and in no acute distress. Vital signs are significant for borderline hypertension with blood pressure 141/56. Oxygen saturation is 100%, which is normal. Head is normocephalic and atraumatic. PERRLA, EOMI. Oropharynx is clear. Neck is nontender and supple without adenopathy or JVD. Back is nontender and there is no CVA tenderness. Lungs are clear without rales, wheezes, or rhonchi. Chest is nontender. Heart has regular rate and rhythm without murmur. Abdomen is soft, flat, nontender without masses or hepatosplenomegaly and peristalsis is normoactive. Extremities have no cyanosis or edema, full range of motion is present. Skin is warm and dry without rash. Neurologic: Mental status is normal, cranial nerves are intact, there are no motor or sensory deficits.  ED Course   Procedures (including critical care time)  Results for orders placed during the hospital encounter of  01/22/13  CBC      Result Value Range   WBC 5.9  4.0 - 10.5 K/uL   RBC 3.37 (*) 3.87 - 5.11 MIL/uL   Hemoglobin 11.4 (*) 12.0 - 15.0 g/dL   HCT 16.1 (*) 09.6 - 04.5 %   MCV 94.7  78.0 - 100.0 fL   MCH 33.8  26.0 - 34.0 pg   MCHC 35.7  30.0 - 36.0 g/dL   RDW 40.9  81.1 - 91.4 %   Platelets 222  150 - 400 K/uL  BASIC METABOLIC PANEL      Result Value Range   Sodium 137  135 - 145 mEq/L   Potassium 4.2  3.5 - 5.1 mEq/L   Chloride 99  96 - 112 mEq/L   CO2 29  19 - 32 mEq/L   Glucose, Bld 99  70 - 99 mg/dL   BUN 18  6 - 23 mg/dL   Creatinine, Ser 7.82  0.50 - 1.10 mg/dL   Calcium 9.7  8.4 - 95.6 mg/dL   GFR calc non Af Amer 64 (*) >90 mL/min   GFR calc Af Amer 75 (*) >90 mL/min  POCT I-STAT TROPONIN I      Result Value Range   Troponin i, poc 0.02  0.00 - 0.08 ng/mL   Comment 3            Dg Chest 2 View  01/22/2013   *RADIOLOGY REPORT*  Clinical Data: Chest pain  CHEST - 2 VIEW  Comparison: Prior chest x-ray 01/12/2013  Findings: Stable appearance of the chest.  Negative for focal airspace consolidation, pulmonary edema, pleural effusion or pneumothorax.  Stable hyperinflation, emphysema and COPD.  Cardiac and mediastinal contours are unchanged.  Status post median sternotomy with evidence of prior CABG.  Atherosclerotic calcifications in the transverse aorta.  No acute osseous abnormality.  IMPRESSION:  1.  No acute cardiopulmonary process. 2.  COPD, emphysema and coronary artery disease.   Original Report Authenticated By: Malachy Moan, M.D.     Date: 01/22/2013  Rate: 60  Rhythm: normal sinus rhythm  QRS Axis: normal  Intervals: normal  ST/T Wave abnormalities: nonspecific ST changes  Conduction Disutrbances:none  Narrative Interpretation: Nonspecific ST changes. When compared with ECG of 01/12/2013, no significant changes are seen.  Old EKG Reviewed: unchanged   1. Chest pain     MDM  Chest pain in patient with known coronary artery disease. Old records were  reviewed and she had a catheterization in January which showed patent grafts and stents. She was hospitalized 10 days ago with a borderline elevated troponin and decision was made to continue medical management. Cardiac markers will be checked and she will need to be kept for at least 3 troponins. Her pain recurred several times, she is placed on topical nitrates. Case is discussed with Dr. Toniann Fail [facility] who agrees to admit the patient.  Dione Booze, MD 01/22/13 2130  Dione Booze, MD 01/22/13 [phone]

## 2013-03-15 ENCOUNTER — Other Ambulatory Visit (HOSPITAL_BASED_OUTPATIENT_CLINIC_OR_DEPARTMENT_OTHER): Payer: Medicare Other | Admitting: Lab

## 2013-03-15 DIAGNOSIS — C9000 Multiple myeloma not having achieved remission: Secondary | ICD-10-CM

## 2013-03-15 LAB — CBC WITH DIFFERENTIAL/PLATELET
Basophils Absolute: 0 10*3/uL (ref 0.0–0.1)
EOS%: 3.1 % (ref 0.0–7.0)
Eosinophils Absolute: 0.1 10*3/uL (ref 0.0–0.5)
HCT: 30.2 % — ABNORMAL LOW (ref 34.8–46.6)
HGB: 10.1 g/dL — ABNORMAL LOW (ref 11.6–15.9)
MCH: 32.6 pg (ref 25.1–34.0)
MCV: 97.4 fL (ref 79.5–101.0)
NEUT#: 2.4 10*3/uL (ref 1.5–6.5)
NEUT%: 54.8 % (ref 38.4–76.8)
lymph#: 1.3 10*3/uL (ref 0.9–3.3)

## 2013-03-15 LAB — COMPREHENSIVE METABOLIC PANEL (CC13)
ALT: 36 U/L (ref 0–55)
Albumin: 3.3 g/dL — ABNORMAL LOW (ref 3.5–5.0)
Anion Gap: 8 mEq/L (ref 3–11)
CO2: 28 mEq/L (ref 22–29)
Calcium: 9.4 mg/dL (ref 8.4–10.4)
Chloride: 102 mEq/L (ref 98–109)
Glucose: 76 mg/dl (ref 70–140)
Potassium: 4.1 mEq/L (ref 3.5–5.1)
Sodium: 138 mEq/L (ref 136–145)
Total Bilirubin: 0.34 mg/dL (ref 0.20–1.20)
Total Protein: 7.7 g/dL (ref 6.4–8.3)

## 2013-03-17 LAB — PROTEIN ELECTROPHORESIS, SERUM
Albumin ELP: 48.9 % — ABNORMAL LOW (ref 55.8–66.1)
Alpha-1-Globulin: 3.3 % (ref 2.9–4.9)
Beta 2: 31.2 % — ABNORMAL HIGH (ref 3.2–6.5)
Beta Globulin: 4.6 % — ABNORMAL LOW (ref 4.7–7.2)
Gamma Globulin: 3.5 % — ABNORMAL LOW (ref 11.1–18.8)

## 2013-03-17 LAB — KAPPA/LAMBDA LIGHT CHAINS: Kappa:Lambda Ratio: 0.05 — ABNORMAL LOW (ref 0.26–1.65)

## 2013-03-22 ENCOUNTER — Ambulatory Visit (HOSPITAL_BASED_OUTPATIENT_CLINIC_OR_DEPARTMENT_OTHER): Payer: Medicare Other | Admitting: Oncology

## 2013-03-22 VITALS — BP 133/60 | HR 60 | Temp 97.3°F | Resp 20 | Ht 63.0 in | Wt 108.8 lb

## 2013-03-22 DIAGNOSIS — M899 Disorder of bone, unspecified: Secondary | ICD-10-CM

## 2013-03-22 DIAGNOSIS — C9000 Multiple myeloma not having achieved remission: Secondary | ICD-10-CM

## 2013-03-22 DIAGNOSIS — D630 Anemia in neoplastic disease: Secondary | ICD-10-CM

## 2013-03-22 NOTE — Progress Notes (Signed)
   [facility]    OFFICE PROGRESS NOTE   INTERVAL HISTORY:   [name] returns for scheduled followup of multiple myeloma. She reports several episodes of "chest pain "over the past few months. No consistent pain. She also reports recent urinary tract infections, treated at the urology Center. She is being treated for "bladder spasms "with an acupuncture like therapy at the urology Center.   Objective:  Vital signs in last 24 hours:  Blood pressure 133/60, pulse 60, temperature 97.3 F (36.3 C), temperature source Oral, resp. rate 20, height 5\' 3"  (1.6 m), weight 108 lb 12.8 oz (49.351 kg).    HEENT: Neck without mass Lymphatics: No cervical or supraclavicular nodes. Pea-sized bilateral axillary nodes. Resp: Lungs clear bilaterally, distant breath sounds Cardio: Regular rate and rhythm GI: No hepatosplenomegaly Vascular: The right lower leg is larger than the left side, no edema   Lab Results:  Lab Results  Component Value Date   WBC 4.3 03/15/2013   HGB 10.1* 03/15/2013   HCT 30.2* 03/15/2013   MCV 97.4 03/15/2013   PLT 217 03/15/2013   ANC 2.4  BUN 19, creatinine 1.0, calcium 9.4, albumin 3.3 Serum M spike 2.06 Serum free lambda light chains 33.4   Medications: I have reviewed the patient's current medications.  Assessment/Plan: 1. Indolent multiple myeloma, asymptomatic. No clinical evidence of disease progression. A metastatic bone survey in October 2013 was negative for lytic lesions. The serum M spike was lower on 03/15/2013. 2. Mild anemia secondary to multiple myeloma 3. History of mild neutropenia, likely related to multiple myeloma. The neutrophil count remains in the normal range. 4. History of coronary artery disease. 5. Temporomandibular joint. 6. Osteopenia. 7. History of multiple urinary tract infections, followed by Dr. Retta Diones. 8. "Raynaud" syndrome. 9. Chronic back pain. 10. History of hyponatremia. 11. Right foot surgery February  2014   Disposition:  She has indolent multiple myeloma and remains asymptomatic. The hemoglobin was slightly lower last week. We will arrange for a CBC at the assisted living facility in approximately 2 months. [name] will return for an office and lab visit in 6 months. There is no indication for treating the myeloma at present. She will remain up-to-date on the influenza and pneumococcal vaccines.   Thornton Papas, MD  03/22/2013  4:11 PM

## 2013-03-24 ENCOUNTER — Telehealth: Payer: Self-pay | Admitting: Oncology

## 2013-03-24 NOTE — Telephone Encounter (Signed)
lmonvm advising the pt of her April 2015 appts.

## 2013-04-20 ENCOUNTER — Encounter: Payer: Self-pay | Admitting: Interventional Cardiology

## 2013-06-26 ENCOUNTER — Encounter (HOSPITAL_BASED_OUTPATIENT_CLINIC_OR_DEPARTMENT_OTHER): Payer: Self-pay | Admitting: Emergency Medicine

## 2013-06-26 ENCOUNTER — Emergency Department (HOSPITAL_BASED_OUTPATIENT_CLINIC_OR_DEPARTMENT_OTHER)
Admission: EM | Admit: 2013-06-26 | Discharge: 2013-06-26 | Disposition: A | Discharge status: Home / Self Care | Payer: Medicare Other | Attending: Emergency Medicine | Admitting: Emergency Medicine

## 2013-06-26 DIAGNOSIS — Z880 Allergy status to penicillin: Secondary | ICD-10-CM | POA: Insufficient documentation

## 2013-06-26 DIAGNOSIS — K219 Gastro-esophageal reflux disease without esophagitis: Secondary | ICD-10-CM | POA: Insufficient documentation

## 2013-06-26 DIAGNOSIS — I10 Essential (primary) hypertension: Secondary | ICD-10-CM | POA: Insufficient documentation

## 2013-06-26 DIAGNOSIS — Z7982 Long term (current) use of aspirin: Secondary | ICD-10-CM | POA: Insufficient documentation

## 2013-06-26 DIAGNOSIS — Z9861 Coronary angioplasty status: Secondary | ICD-10-CM | POA: Insufficient documentation

## 2013-06-26 DIAGNOSIS — N390 Urinary tract infection, site not specified: Secondary | ICD-10-CM | POA: Insufficient documentation

## 2013-06-26 DIAGNOSIS — M129 Arthropathy, unspecified: Secondary | ICD-10-CM | POA: Insufficient documentation

## 2013-06-26 DIAGNOSIS — F411 Generalized anxiety disorder: Secondary | ICD-10-CM | POA: Insufficient documentation

## 2013-06-26 DIAGNOSIS — Z951 Presence of aortocoronary bypass graft: Secondary | ICD-10-CM | POA: Insufficient documentation

## 2013-06-26 DIAGNOSIS — I209 Angina pectoris, unspecified: Secondary | ICD-10-CM | POA: Insufficient documentation

## 2013-06-26 DIAGNOSIS — I2510 Atherosclerotic heart disease of native coronary artery without angina pectoris: Secondary | ICD-10-CM | POA: Insufficient documentation

## 2013-06-26 DIAGNOSIS — Z79899 Other long term (current) drug therapy: Secondary | ICD-10-CM | POA: Insufficient documentation

## 2013-06-26 DIAGNOSIS — R42 Dizziness and giddiness: Secondary | ICD-10-CM | POA: Insufficient documentation

## 2013-06-26 LAB — CBC WITH DIFFERENTIAL/PLATELET
Basophils Absolute: 0 10*3/uL (ref 0.0–0.1)
Basophils Relative: 1 % (ref 0–1)
EOS ABS: 0.2 10*3/uL (ref 0.0–0.7)
EOS PCT: 4 % (ref 0–5)
HEMATOCRIT: 35.2 % — AB (ref 36.0–46.0)
Hemoglobin: 12 g/dL (ref 12.0–15.0)
LYMPHS ABS: 1.7 10*3/uL (ref 0.7–4.0)
Lymphocytes Relative: 35 % (ref 12–46)
MCH: 32.7 pg (ref 26.0–34.0)
MCHC: 34.1 g/dL (ref 30.0–36.0)
MCV: 95.9 fL (ref 78.0–100.0)
MONO ABS: 0.6 10*3/uL (ref 0.1–1.0)
Monocytes Relative: 12 % (ref 3–12)
Neutro Abs: 2.3 10*3/uL (ref 1.7–7.7)
Neutrophils Relative %: 48 % (ref 43–77)
PLATELETS: 224 10*3/uL (ref 150–400)
RBC: 3.67 MIL/uL — AB (ref 3.87–5.11)
RDW: 14.1 % (ref 11.5–15.5)
WBC: 4.9 10*3/uL (ref 4.0–10.5)

## 2013-06-26 LAB — URINALYSIS, ROUTINE W REFLEX MICROSCOPIC
Bilirubin Urine: NEGATIVE
GLUCOSE, UA: NEGATIVE mg/dL
HGB URINE DIPSTICK: NEGATIVE
Ketones, ur: NEGATIVE mg/dL
Nitrite: NEGATIVE
Protein, ur: NEGATIVE mg/dL
SPECIFIC GRAVITY, URINE: 1.016 (ref 1.005–1.030)
Urobilinogen, UA: 0.2 mg/dL (ref 0.0–1.0)
pH: 6.5 (ref 5.0–8.0)

## 2013-06-26 LAB — BASIC METABOLIC PANEL
BUN: 21 mg/dL (ref 6–23)
CALCIUM: 10.3 mg/dL (ref 8.4–10.5)
CO2: 28 meq/L (ref 19–32)
CREATININE: 0.9 mg/dL (ref 0.50–1.10)
Chloride: 96 mEq/L (ref 96–112)
GFR calc Af Amer: 69 mL/min — ABNORMAL LOW (ref >90)
GFR calc non Af Amer: 59 mL/min — ABNORMAL LOW (ref >90)
GLUCOSE: 98 mg/dL (ref 70–99)
Potassium: 4.2 mEq/L (ref 3.7–5.3)
SODIUM: 138 meq/L (ref 137–147)

## 2013-06-26 LAB — CG4 I-STAT (LACTIC ACID): LACTIC ACID, VENOUS: 0.54 mmol/L (ref 0.5–2.2)

## 2013-06-26 LAB — URINE MICROSCOPIC-ADD ON

## 2013-06-26 MED ORDER — CEFUROXIME AXETIL 500 MG PO TABS: 250.0000 mg | ORAL_TABLET | Freq: Two times a day (BID) | ORAL | Status: DC

## 2013-06-26 MED ORDER — SODIUM CHLORIDE 0.9 % IV BOLUS (SEPSIS)
1000.0000 mL | Freq: Once | INTRAVENOUS | Status: AC
  Administered 2013-06-26: 1000 mL via INTRAVENOUS

## 2013-06-26 MED ORDER — DULOXETINE HCL 30 MG PO CPEP: 30.0000 mg | ORAL_CAPSULE | Freq: Every day | ORAL | Status: DC

## 2013-06-26 MED ORDER — CEFTRIAXONE SODIUM 1 G IJ SOLR
INTRAMUSCULAR | Status: AC
  Filled 2013-06-26: qty 10

## 2013-06-26 MED ORDER — DEXTROSE 5 % IV SOLN
1.0000 g | Freq: Once | INTRAVENOUS | Status: AC
  Administered 2013-06-26: 1 g via INTRAVENOUS

## 2013-06-26 NOTE — ED Notes (Signed)
Pt states she is having UTI symptoms along with dizziness since Thursday.  Pt states she stopped her cymbalta.  uti symptoms have been ongoing x one month.

## 2013-06-26 NOTE — ED Provider Notes (Signed)
CSN: [id number]     Arrival date & time 06/26/13  1230 History  This chart was scribed for Tammy Dakin, MD by [name], ED scribe. This patient was seen in room MH06/MH06 and the patient's care was started at 1510.    Chief Complaint  Patient presents with  . Urinary Tract Infection  . Dizziness  . Medication Reaction   Patient is a 80 y.o. female presenting with dizziness. The history is provided by the patient. No language interpreter was used.  Dizziness Associated symptoms: no chest pain and no shortness of breath    HPI Comments: [name] is a 80 y.o. female who presents to the Emergency Department complaining of lightheadedness, onset 3 days ago which she attributes to not taking enough of her cymbalta medicine. She reports also ran out of her other medicines which are being refilled at the pharmacy. She denies feeling of the room spinning. She states that her lightheadedness has improved since 06/23/13.   Also c/o UTI which 2 different antibiotics have not helped recently. She took doxycycline as her first. [name] of her second antibiotic. She reports dysuria. And burning on urination. No fever no nausea or vomiting. She feels improved since arrival at the emergency department  She reports hx of CABG. She denies any recent CP or palpitations. She had episodes 4 months ago w/anxiety and palpitations in which she took NTG for.    She does not smoke. She occasionally drinks alcohol.  Past Medical History  Diagnosis Date  . Hx of CABG   . Hypertension   . GERD (gastroesophageal reflux disease)   . Anxiety   . Coronary artery disease   . Arthritis   . Anginal pain   . Shortness of breath   . Intermediate coronary syndrome    Past Surgical History  Procedure Laterality Date  . Coronary artery bypass graft  2008  . Cardiac catheterization  2007,    2 stents  . Cardiac catheterization  2014  . Tonsillectomy    . Dilation and curettage of uterus    . Eye surgery       both cataracts  . Colonoscopy    . Upper gastrointestinal endoscopy    . Capsulotomy Right 08/04/2012    Procedure: CAPSULOTOMY;  Surgeon: Tammy Rhein, MD;  Location: [place];  Service: Orthopedics;  Laterality: Right;  RIGHT 2ND TOE METATARSOPHALANGEAL JOINT DORSAL CAPSULOTOMY   . Bunionectomy with hammertoe reconstruction Right 08/04/2012    Procedure:  HAMMERTOE RECONSTRUCTION;  Surgeon: Tammy Rhein, MD;  Location: [place];  Service: Orthopedics;  Laterality: Right;  HAMMER TOE RECONSTRUCTION PROBABLE FLEXOR DIGITORUM LONGUS TO PROXIMAL PHALANX TRANSFER LATERALIZED    No family history on file. History  Substance Use Topics  . Smoking status: Never Smoker   . Smokeless tobacco: Never Used  . Alcohol Use: Yes     Comment: occ   OB History   Grav Para Term Preterm Abortions TAB SAB Ect Mult Living                 Review of Systems  Constitutional: Negative.   HENT: Negative.   Respiratory: Negative.  Negative for shortness of breath.   Cardiovascular: Negative.  Negative for chest pain.  Gastrointestinal: Negative.   Genitourinary: Positive for dysuria.  Musculoskeletal: Negative.   Skin: Negative.   Neurological: Positive for dizziness.  Psychiatric/Behavioral: Negative.    A complete 10 system review of systems was obtained and all  systems are negative except as noted in the HPI and PMH.   Allergies  Levaquin; Codeine; Erythromycin; Food; Macrodantin; Morphine and related; Simvastatin; Butazolidin; Ibuprofen; and Penicillins  Home Medications   Current Outpatient Rx  Name  Route  Sig  Dispense  Refill  . ARTIFICIAL TEAR OP   Ophthalmic   Apply 1 drop to eye daily as needed. For dry eyes         . aspirin 325 MG tablet   Oral   Take 325 mg by mouth daily.         . Biotin 5000 MCG CAPS   Oral   Take 5,000 mcg by mouth every evening.          . Calcium Carbonate (CALCIUM 600 PO)   Oral   Take 600 mg by mouth  daily.         Marland Kitchen co-enzyme Q-10 30 MG capsule   Oral   Take 100 mg by mouth daily.         . DULoxetine (CYMBALTA) 30 MG capsule      1 tablet daily for a week then 2 tablets daily.   60 capsule   0   . fluticasone (FLONASE) 50 MCG/ACT nasal spray   Nasal   Place 2 sprays into the nose daily as needed. For allergies         . isosorbide mononitrate (IMDUR) 60 MG 24 hr tablet   Oral   Take 1 tablet (60 mg total) by mouth daily.   30 tablet   0   . LORazepam (ATIVAN) 0.5 MG tablet   Oral   Take 1 tablet (0.5 mg total) by mouth at bedtime.   30 tablet   0   . metoprolol tartrate (LOPRESSOR) 25 MG tablet   Oral   Take 25 mg by mouth 2 (two) times daily.         . mirabegron ER (MYRBETRIQ) 50 MG TB24   Oral   Take 50 mg by mouth daily.         . Multiple Vitamin (MULITIVITAMIN WITH MINERALS) TABS   Oral   Take 1 tablet by mouth daily.         Marland Kitchen NITROSTAT 0.4 MG SL tablet   Sublingual   Place 0.4 mg under the tongue every 5 (five) minutes as needed for chest pain.          . NON FORMULARY   Oral   Take 30 mLs by mouth daily. [place]ag[place] Apple Cider Vinegar in 6 oz water (mixed with honey)         . NON FORMULARY   Oral   Take by mouth at bedtime. [place] Yougurt         . polyethylene glycol (MIRALAX / GLYCOLAX) packet   Oral   Take 17 g by mouth at bedtime.         . rosuvastatin (CRESTOR) 20 MG tablet   Oral   Take 20 mg by mouth daily.         Marland Kitchen SALINE NASAL SPRAY NA   Nasal   Place 3-4 sprays into the nose 2 (two) times daily as needed (for dryness).          Marland Kitchen UNABLE TO FIND   Intradermal   Inject into the skin once a week. Med Name: [facility] X 12 weeks to right ankle (lasts 30 minutes)          Triage Vitals: BP  139/66  Pulse 76  Temp(Src) 98 F (36.7 C) (Oral)  Resp 16  Ht 5\' 3"  (1.6 m)  Wt 110 lb (49.896 kg)  BMI 19.49 kg/m2  SpO2 98% Physical Exam  Nursing note and vitals  reviewed. Constitutional: She is oriented to person, place, and time. She appears well-developed and well-nourished. No distress.  HENT:  Head: Normocephalic and atraumatic.  Eyes: Conjunctivae are normal. Right eye exhibits no discharge. Left eye exhibits no discharge.  Neck: Normal range of motion.  Cardiovascular: Normal rate.   Pulmonary/Chest: Effort normal. No respiratory distress.  Musculoskeletal: Normal range of motion. She exhibits no edema.  Neurological: She is alert and oriented to person, place, and time.  Minimally lightheaded when she stands. Gait is normal  Skin: Skin is warm and dry.  Psychiatric: She has a normal mood and affect. Thought content normal.    ED Course  Procedures (including critical care time) DIAGNOSTIC STUDIES: Oxygen Saturation is 98% on room air, normal by my interpretation.    COORDINATION OF CARE: At 325 PM Discussed treatment plan with patient which includes blood work, antibiotics, UA, IV fluids. Patient agrees.   Labs Review Labs Reviewed  URINALYSIS, ROUTINE W REFLEX MICROSCOPIC - Abnormal; Notable for the following:    Leukocytes, UA SMALL (*)    All other components within normal limits  URINE MICROSCOPIC-ADD ON - Abnormal; Notable for the following:    Bacteria, UA FEW (*)    All other components within normal limits  URINE CULTURE  BASIC METABOLIC PANEL  CBC WITH DIFFERENTIAL   Results for orders placed during the hospital encounter of 06/26/13  URINALYSIS, ROUTINE W REFLEX MICROSCOPIC      Result Value Range   Color, Urine YELLOW  YELLOW   APPearance CLEAR  CLEAR   Specific Gravity, Urine 1.016  1.005 - 1.030   pH 6.5  5.0 - 8.0   Glucose, UA NEGATIVE  NEGATIVE mg/dL   Hgb urine dipstick NEGATIVE  NEGATIVE   Bilirubin Urine NEGATIVE  NEGATIVE   Ketones, ur NEGATIVE  NEGATIVE mg/dL   Protein, ur NEGATIVE  NEGATIVE mg/dL   Urobilinogen, UA 0.2  0.0 - 1.0 mg/dL   Nitrite NEGATIVE  NEGATIVE   Leukocytes, UA SMALL (*)  NEGATIVE  URINE MICROSCOPIC-ADD ON      Result Value Range   WBC, UA 11-20  <3 WBC/hpf   Bacteria, UA FEW (*) RARE  BASIC METABOLIC PANEL      Result Value Range   Sodium 138  137 - 147 mEq/L   Potassium 4.2  3.7 - 5.3 mEq/L   Chloride 96  96 - 112 mEq/L   CO2 28  19 - 32 mEq/L   Glucose, Bld 98  70 - 99 mg/dL   BUN 21  6 - 23 mg/dL   Creatinine, Ser 0.90  0.50 - 1.10 mg/dL   Calcium 10.3  8.4 - 10.5 mg/dL   GFR calc non Af Amer 59 (*) >90 mL/min   GFR calc Af Amer 69 (*) >90 mL/min  CBC WITH DIFFERENTIAL      Result Value Range   WBC 4.9  4.0 - 10.5 K/uL   RBC 3.67 (*) 3.87 - 5.11 MIL/uL   Hemoglobin 12.0  12.0 - 15.0 g/dL   HCT 35.2 (*) 36.0 - 46.0 %   MCV 95.9  78.0 - 100.0 fL   MCH 32.7  26.0 - 34.0 pg   MCHC 34.1  30.0 - 36.0 g/dL   RDW 14.1  11.5 - 15.5 %   Platelets 224  150 - 400 K/uL   Neutrophils Relative % 48  43 - 77 %   Neutro Abs 2.3  1.7 - 7.7 K/uL   Lymphocytes Relative 35  12 - 46 %   Lymphs Abs 1.7  0.7 - 4.0 K/uL   Monocytes Relative 12  3 - 12 %   Monocytes Absolute 0.6  0.1 - 1.0 K/uL   Eosinophils Relative 4  0 - 5 %   Eosinophils Absolute 0.2  0.0 - 0.7 K/uL   Basophils Relative 1  0 - 1 %   Basophils Absolute 0.0  0.0 - 0.1 K/uL  CG4 I-STAT (LACTIC ACID)      Result Value Range   Lactic Acid, Venous 0.54  0.5 - 2.2 mmol/L   No results found.  Imaging Review No results found.  EKG Interpretation   None      5:05 PM patient is no longer lightheaded on standing. She feels improved after treatment with intravenous fluids and intravenous ceftriaxone. She is alert and ambulatory. MDM  No diagnosis found. weakness and lightheadedness felt secondary to mild dehydration or possibly medication error on part of patient Plan prescription Cymbalta, patient request, and she has run out, prescription Ceftin. Encourage oral hydration Followup Dr.Macdirmid this week Diagnosis #1 urinary tract infection #2 weakness    Tammy Dakin, MD 06/26/13  1710

## 2013-06-26 NOTE — Discharge Instructions (Signed)
Urinary Tract Infection Drink six 8 ounce glasses of water per day so that you can see well hydrated. Call Dr.Macdiarmid tomorrow or the next day to arrange to have an office visit this week. Your urine has been sent for culture. If the antibiotic needs to be changed we will call you Urinary tract infections (UTIs) can develop anywhere along your urinary tract. Your urinary tract is your body's drainage system for removing wastes and extra water. Your urinary tract includes two kidneys, two ureters, a bladder, and a urethra. Your kidneys are a pair of bean-shaped organs. Each kidney is about the size of your fist. They are located below your ribs, one on each side of your spine. CAUSES Infections are caused by microbes, which are microscopic organisms, including fungi, viruses, and bacteria. These organisms are so small that they can only be seen through a microscope. Bacteria are the microbes that most commonly cause UTIs. SYMPTOMS  Symptoms of UTIs may vary by age and gender of the patient and by the location of the infection. Symptoms in young women typically include a frequent and intense urge to urinate and a painful, burning feeling in the bladder or urethra during urination. Older women and men are more likely to be tired, shaky, and weak and have muscle aches and abdominal pain. A fever may mean the infection is in your kidneys. Other symptoms of a kidney infection include pain in your back or sides below the ribs, nausea, and vomiting. DIAGNOSIS To diagnose a UTI, your caregiver will ask you about your symptoms. Your caregiver also will ask to provide a urine sample. The urine sample will be tested for bacteria and white blood cells. White blood cells are made by your body to help fight infection. TREATMENT  Typically, UTIs can be treated with medication. Because most UTIs are caused by a bacterial infection, they usually can be treated with the use of antibiotics. The choice of antibiotic and  length of treatment depend on your symptoms and the type of bacteria causing your infection. HOME CARE INSTRUCTIONS  If you were prescribed antibiotics, take them exactly as your caregiver instructs you. Finish the medication even if you feel better after you have only taken some of the medication.  Drink enough water and fluids to keep your urine clear or pale yellow.  Avoid caffeine, tea, and carbonated beverages. They tend to irritate your bladder.  Empty your bladder often. Avoid holding urine for long periods of time.  Empty your bladder before and after sexual intercourse.  After a bowel movement, women should cleanse from front to back. Use each tissue only once. SEEK MEDICAL CARE IF:   You have back pain.  You develop a fever.  Your symptoms do not begin to resolve within 3 days. SEEK IMMEDIATE MEDICAL CARE IF:   You have severe back pain or lower abdominal pain.  You develop chills.  You have nausea or vomiting.  You have continued burning or discomfort with urination. MAKE SURE YOU:   Understand these instructions.  Will watch your condition.  Will get help right away if you are not doing well or get worse. Document Released: 03/05/2005 Document Revised: 11/25/2011 Document Reviewed: 07/04/2011 [facility] Patient Information 2014 [place].

## 2013-06-27 LAB — URINE CULTURE

## 2013-07-13 ENCOUNTER — Other Ambulatory Visit: Payer: Self-pay | Admitting: Interventional Cardiology

## 2013-07-21 ENCOUNTER — Ambulatory Visit (INDEPENDENT_AMBULATORY_CARE_PROVIDER_SITE_OTHER): Payer: Medicare Other | Admitting: Interventional Cardiology

## 2013-07-21 ENCOUNTER — Encounter: Payer: Self-pay | Admitting: Interventional Cardiology

## 2013-07-21 VITALS — BP 140/60 | HR 59 | Ht 63.0 in | Wt 111.0 lb

## 2013-07-21 DIAGNOSIS — E785 Hyperlipidemia, unspecified: Secondary | ICD-10-CM

## 2013-07-21 DIAGNOSIS — Z951 Presence of aortocoronary bypass graft: Secondary | ICD-10-CM

## 2013-07-21 DIAGNOSIS — C9000 Multiple myeloma not having achieved remission: Secondary | ICD-10-CM | POA: Insufficient documentation

## 2013-07-21 DIAGNOSIS — I25810 Atherosclerosis of coronary artery bypass graft(s) without angina pectoris: Secondary | ICD-10-CM

## 2013-07-21 DIAGNOSIS — I10 Essential (primary) hypertension: Secondary | ICD-10-CM

## 2013-07-21 NOTE — Progress Notes (Signed)
Patient ID: [name], female   DOB: June 04, 1932, 80 y.o.   MRN: [id number] Medical History: CAD with CABG 2007, LIMA to LAD, SVG to RCA non-ST segment MI 08/2009-Dr. Tamala Julian , Hypertension, Hyperlipidemiagoal LDL less than 70, Raynaud's phenomenon, Panic disorder, Spinal stenosis, osteopenia 8 years Fosamax discontinued 08/2009, multiple myeloma-Dr.Sherrill, Folstein minimental 11/2010 29/30, Gerd, Chronic mild anemia hemoglobin 11.3, Urine positive for microalbumin 12/2011, urology-Dr. Matilde Sprang, joint pain Dr Oneida Alar, Ovarian cyst, bilateral (rt complex), Hiatal hernia upper endoscopy 11/2012.     [address], [address] [place], [place]  [address] Phone: [phone] Fax:  [phone]  Date:  07/21/2013   ID:  [name], DOB 1932/04/09, MRN [id number]  PCP:  Mathews Argyle, MD   ASSESSMENT:  1. Coronary artery disease with stable angina. We suspect a vasoconstrictive component. Relief with nitroglycerin 2. Hypertension control 3. Hyperlipidemia  PLAN:  1. Continue nitroglycerin use for episodes of chest pain 2. Clinical followup in one year   SUBJECTIVE: [name] is a 80 y.o. female who has a stable pattern of exertional angina and also angina associated with emotional stress. Nitroglycerin relieves the discomfort. She has some hyperemia when she lies down at night. This does not occur during the day as she lies down for a nap because she props up on pillows. She has had right lower extremity greater than left lower extremity edema. This resolves by each morning.   Wt Readings from Last 3 Encounters:  07/21/13 111 lb (50.349 kg)  06/26/13 110 lb (49.896 kg)  03/22/13 108 lb 12.8 oz (49.351 kg)     Past Medical History  Diagnosis Date  . Hx of CABG   . Hypertension   . GERD (gastroesophageal reflux disease)   . Anxiety   . Coronary artery disease   . Arthritis   . Anginal pain   . Shortness of breath   . Intermediate coronary syndrome   . Raynauds  syndrome   . Coronary atherosclerosis of unspecified type of vessel, native or graft   . Coronary atherosclerosis of autologous vein bypass graft   . Spinal stenosis of lumbar region   . Hypercholesteremia   . Hyperlipidemia     GOAL LDL <70  . Panic disorder   . Osteopenia   . Multiple myeloma     Dr. Benay Spice  . Mild chronic anemia   . Urine test positive for microalbuminuria 12/2011  . Ovarian cyst, bilateral     Rt complex  . Allergic rhinitis   . Atypical chest pain 04/25/13    Doing better, does occasionally respond to Nitroglycerin    Current Outpatient Prescriptions  Medication Sig Dispense Refill  . ARTIFICIAL TEAR OP Apply 1 drop to eye daily as needed. For dry eyes      . aspirin 325 MG tablet Take 325 mg by mouth daily.      . Biotin 5000 MCG CAPS Take 5,000 mcg by mouth every evening.       Marland Kitchen co-enzyme Q-10 30 MG capsule Take 100 mg by mouth daily.      . DULoxetine (CYMBALTA) 30 MG capsule 1 tablet daily for a week then 2 tablets daily.  60 capsule  0  . DULoxetine (CYMBALTA) 30 MG capsule Take 1 capsule (30 mg total) by mouth daily.  2 capsule  0  . ESTRACE VAGINAL 0.1 MG/GM vaginal cream       . fluticasone (FLONASE) 50 MCG/ACT nasal spray Place 2 sprays into the nose  daily as needed. For allergies      . isosorbide mononitrate (IMDUR) 60 MG 24 hr tablet Take 1 tablet (60 mg total) by mouth daily.  30 tablet  0  . LORazepam (ATIVAN) 0.5 MG tablet Take 1 tablet (0.5 mg total) by mouth at bedtime.  30 tablet  0  . metoprolol tartrate (LOPRESSOR) 25 MG tablet TAKE ONE TABLET TWICE DAILY  180 tablet  3  . mirabegron ER (MYRBETRIQ) 50 MG TB24 Take 50 mg by mouth daily. Takes 25 mg      . Multiple Vitamin (MULITIVITAMIN WITH MINERALS) TABS Take 1 tablet by mouth daily.      Marland Kitchen NITROSTAT 0.4 MG SL tablet Place 0.4 mg under the tongue every 5 (five) minutes as needed for chest pain.       . NON FORMULARY Take 30 mLs by mouth daily. Braggs Apple Cider Vinegar in 6 oz water  (mixed with honey)      . NON FORMULARY Take by mouth at bedtime. [place] Yougurt      . polyethylene glycol (MIRALAX / GLYCOLAX) packet Take 17 g by mouth at bedtime.      . rosuvastatin (CRESTOR) 20 MG tablet Take 20 mg by mouth daily.      Marland Kitchen SALINE NASAL SPRAY NA Place 3-4 sprays into the nose 2 (two) times daily as needed (for dryness).       Marland Kitchen UNABLE TO FIND Inject into the skin once a week. Med Name: [facility] per [facility] weekly X 12 weeks to right ankle (lasts 30 minutes)       No current facility-administered medications for this visit.    Allergies:    Allergies  Allergen Reactions  . Levaquin [Levofloxacin] Shortness Of Breath  . Cephalosporins   . Cilostazol Other (See Comments)    Stomach upset  . Codeine Nausea Only  . Dexilant [Dexlansoprazole] Diarrhea  . Erythromycin Nausea And Vomiting    All "mycin" drugs  . Food Other (See Comments)    No spicy foods or black pepper, raw onions - causes gerd  . Macrodantin Other (See Comments)    Double vision  . Morphine And Related Other (See Comments)    hallucinations  . Simvastatin Other (See Comments)    Muscle aches  . Sulfa Antibiotics   . Toviaz [Fesoterodine Fumarate Er]   . Vesicare [Solifenacin]   . Voltaren [Diclofenac Sodium]   . Butazolidin [Phenylbutazone] Swelling and Rash  . Ibuprofen Hives and Rash  . Penicillins Rash    Social History:  The patient  reports that she has never smoked. She has never used smokeless tobacco. She reports that she drinks alcohol. She reports that she does not use illicit drugs.   ROS:  Please see the history of present illness.   Significant reduction in memory.   All other systems reviewed and negative.   OBJECTIVE: VS:  BP 140/60  Pulse 59  Ht 5' 3" (1.6 m)  Wt 111 lb (50.349 kg)  BMI 19.67 kg/m2 Well nourished, well developed, in no acute distress, elderly and healthy-appearing HEENT: normal Neck: JVD flat. Carotid bruit absent  Cardiac:  normal S1, S2; RRR;  no murmur Lungs:  clear to auscultation bilaterally, no wheezing, rhonchi or rales Abd: soft, nontender, no hepatomegaly Ext: Edema absent. Pulses 2+ bilateral Skin: warm and dry Neuro:  CNs 2-12 intact, no focal abnormalities noted  EKG:  Sinus rhythm with prominent voltage and nonspecific T wave flat  Signed, Illene Labrador III, MD 07/21/2013 2:20 PM

## 2013-07-21 NOTE — Patient Instructions (Signed)
Your physician wants you to follow-up in: 1 year with Dr. Smith You will receive a reminder letter in the mail two months in advance. If you don't receive a letter, please call our office to schedule the follow-up appointment.  Your physician recommends that you continue on your current medications as directed. Please refer to the Current Medication list given to you today.  

## 2013-07-27 ENCOUNTER — Ambulatory Visit: Payer: Medicare Other | Admitting: Interventional Cardiology

## 2013-08-31 ENCOUNTER — Other Ambulatory Visit: Payer: Self-pay | Admitting: Interventional Cardiology

## 2013-09-20 ENCOUNTER — Other Ambulatory Visit (HOSPITAL_BASED_OUTPATIENT_CLINIC_OR_DEPARTMENT_OTHER): Payer: Medicare Other

## 2013-09-20 DIAGNOSIS — C9000 Multiple myeloma not having achieved remission: Secondary | ICD-10-CM

## 2013-09-20 LAB — CBC WITH DIFFERENTIAL/PLATELET
BASO%: 0.5 % (ref 0.0–2.0)
BASOS ABS: 0 10*3/uL (ref 0.0–0.1)
EOS%: 4.9 % (ref 0.0–7.0)
Eosinophils Absolute: 0.2 10*3/uL (ref 0.0–0.5)
HEMATOCRIT: 34.3 % — AB (ref 34.8–46.6)
HGB: 11.4 g/dL — ABNORMAL LOW (ref 11.6–15.9)
LYMPH%: 26.6 % (ref 14.0–49.7)
MCH: 32.3 pg (ref 25.1–34.0)
MCHC: 33.2 g/dL (ref 31.5–36.0)
MCV: 97.3 fL (ref 79.5–101.0)
MONO#: 0.5 10*3/uL (ref 0.1–0.9)
MONO%: 12.6 % (ref 0.0–14.0)
NEUT#: 2 10*3/uL (ref 1.5–6.5)
NEUT%: 55.4 % (ref 38.4–76.8)
PLATELETS: 251 10*3/uL (ref 145–400)
RBC: 3.53 10*6/uL — ABNORMAL LOW (ref 3.70–5.45)
RDW: 14.1 % (ref 11.2–14.5)
WBC: 3.7 10*3/uL — ABNORMAL LOW (ref 3.9–10.3)
lymph#: 1 10*3/uL (ref 0.9–3.3)

## 2013-09-20 LAB — COMPREHENSIVE METABOLIC PANEL (CC13)
ALT: 17 U/L (ref 0–55)
AST: 35 U/L — AB (ref 5–34)
Albumin: 3.6 g/dL (ref 3.5–5.0)
Alkaline Phosphatase: 39 U/L — ABNORMAL LOW (ref 40–150)
Anion Gap: 10 mEq/L (ref 3–11)
BILIRUBIN TOTAL: 0.31 mg/dL (ref 0.20–1.20)
BUN: 22.2 mg/dL (ref 7.0–26.0)
CO2: 29 mEq/L (ref 22–29)
CREATININE: 1 mg/dL (ref 0.6–1.1)
Calcium: 9.7 mg/dL (ref 8.4–10.4)
Chloride: 99 mEq/L (ref 98–109)
Glucose: 75 mg/dl (ref 70–140)
Potassium: 4.4 mEq/L (ref 3.5–5.1)
Sodium: 138 mEq/L (ref 136–145)
Total Protein: 8.5 g/dL — ABNORMAL HIGH (ref 6.4–8.3)

## 2013-09-22 LAB — PROTEIN ELECTROPHORESIS, SERUM
ALBUMIN ELP: 48.3 % — AB (ref 55.8–66.1)
ALPHA-1-GLOBULIN: 3.5 % (ref 2.9–4.9)
ALPHA-2-GLOBULIN: 9 % (ref 7.1–11.8)
BETA 2: 30.8 % — AB (ref 3.2–6.5)
Beta Globulin: 4.5 % — ABNORMAL LOW (ref 4.7–7.2)
GAMMA GLOBULIN: 3.9 % — AB (ref 11.1–18.8)
M-Spike, %: 2.35 g/dL
Total Protein, Serum Electrophoresis: 8.2 g/dL (ref 6.0–8.3)

## 2013-09-22 LAB — KAPPA/LAMBDA LIGHT CHAINS
Kappa free light chain: 1.73 mg/dL (ref 0.33–1.94)
Kappa:Lambda Ratio: 0.05 — ABNORMAL LOW (ref 0.26–1.65)
LAMBDA FREE LGHT CHN: 34.2 mg/dL — AB (ref 0.57–2.63)

## 2013-09-27 ENCOUNTER — Ambulatory Visit (HOSPITAL_BASED_OUTPATIENT_CLINIC_OR_DEPARTMENT_OTHER): Payer: Medicare Other | Admitting: Oncology

## 2013-09-27 VITALS — BP 126/88 | HR 99 | Temp 97.6°F | Resp 20 | Ht 63.0 in | Wt 109.9 lb

## 2013-09-27 DIAGNOSIS — I7300 Raynaud's syndrome without gangrene: Secondary | ICD-10-CM

## 2013-09-27 DIAGNOSIS — D630 Anemia in neoplastic disease: Secondary | ICD-10-CM

## 2013-09-27 DIAGNOSIS — M549 Dorsalgia, unspecified: Secondary | ICD-10-CM

## 2013-09-27 DIAGNOSIS — C9000 Multiple myeloma not having achieved remission: Secondary | ICD-10-CM

## 2013-09-27 NOTE — Progress Notes (Signed)
  [facility] OFFICE PROGRESS NOTE   Diagnosis: Monoclonal gammopathy  INTERVAL HISTORY:   [name] returns as scheduled. She feels well. She has occasional angina with exertion. She is being evaluated by her gynecologist for an ovarian cyst.  Objective:  Vital signs in last 24 hours:  Blood pressure 126/88, pulse 99, temperature 97.6 F (36.4 C), temperature source Oral, resp. rate 20, height $RemoveBe'5\' 3"'AMAkvDclQ$  (1.6 m), weight 109 lb 14.4 oz (49.85 kg).    HEENT: Neck without mass Lymphatics: No cervical, supraclavicular, or axillary nodes Resp: Distant breath sounds, clear bilaterally, no respiratory distress Cardio: Regular rate and rhythm GI: No hepatosplenic the, no apparent ascites, nontender Vascular: Trace edema at the right greater than left lower leg   Lab Results:  Lab Results  Component Value Date   WBC 3.7* 09/20/2013   HGB 11.4* 09/20/2013   HCT 34.3* 09/20/2013   MCV 97.3 09/20/2013   PLT 251 09/20/2013   NEUTROABS 2.0 09/20/2013   potassium 4.4, creatinine 1.0, calcium 9.7, albumin 3.6, total protein 8.5 Serum M spike 2.35%, serum free lambda light chains 34.2    Medications: I have reviewed the patient's current medications.  Assessment/Plan: 1. Indolent multiple myeloma, asymptomatic. No clinical evidence of disease progression. A metastatic bone survey in October 2013 was negative for lytic lesions. The serum M spike and serum free lambda light chains are stable 2. Mild anemia secondary to multiple myeloma 3. History of mild neutropenia, likely related to multiple myeloma. The neutrophil count remains in the normal range. 4. History of coronary artery disease. 5. Osteopenia. 6. History of multiple urinary tract infections, followed by Dr. Diona Fanti. 7. "Raynaud" syndrome. 8. Chronic back pain. 9. History of hyponatremia. 10. Right foot surgery February 2014   Disposition:  She is stable from a hematologic standpoint. She remains asymptomatic  from the indolent myeloma. [name] will stay up-to-date on the pneumococcal and influenza vaccines. She will return for an office and lab visit in 9 months. She [facility].  Ladell Pier, MD  09/27/2013  4:47 PM

## 2013-09-28 ENCOUNTER — Telehealth: Payer: Self-pay | Admitting: *Deleted

## 2013-09-28 NOTE — Telephone Encounter (Signed)
Pt given handwritten Rx during office visit 4/21 that reads: Please draw CBC/diff, SPEP, serum free light chains and CMET week of 06/19/14. Fax results to Dr. Benay Spice. She requested to have these labs drawn at [facility].

## 2013-09-30 ENCOUNTER — Telehealth: Payer: Self-pay | Admitting: Oncology

## 2013-09-30 NOTE — Telephone Encounter (Signed)
lvm for pt regarding to Jan 2016....mailed pt appt sched /avs and letter °

## 2013-10-13 ENCOUNTER — Other Ambulatory Visit: Payer: Self-pay | Admitting: Interventional Cardiology

## 2013-10-16 ENCOUNTER — Encounter (HOSPITAL_COMMUNITY): Payer: Self-pay | Admitting: Emergency Medicine

## 2013-10-16 ENCOUNTER — Other Ambulatory Visit: Payer: Self-pay

## 2013-10-16 ENCOUNTER — Emergency Department (HOSPITAL_COMMUNITY)
Admission: EM | Admit: 2013-10-16 | Discharge: 2013-10-16 | Disposition: A | Discharge status: Home / Self Care | Payer: Medicare Other | Attending: Emergency Medicine | Admitting: Emergency Medicine

## 2013-10-16 DIAGNOSIS — Z8669 Personal history of other diseases of the nervous system and sense organs: Secondary | ICD-10-CM | POA: Insufficient documentation

## 2013-10-16 DIAGNOSIS — Z880 Allergy status to penicillin: Secondary | ICD-10-CM | POA: Insufficient documentation

## 2013-10-16 DIAGNOSIS — F410 Panic disorder [episodic paroxysmal anxiety] without agoraphobia: Secondary | ICD-10-CM | POA: Insufficient documentation

## 2013-10-16 DIAGNOSIS — R079 Chest pain, unspecified: Secondary | ICD-10-CM

## 2013-10-16 DIAGNOSIS — Z951 Presence of aortocoronary bypass graft: Secondary | ICD-10-CM | POA: Insufficient documentation

## 2013-10-16 DIAGNOSIS — Z79899 Other long term (current) drug therapy: Secondary | ICD-10-CM | POA: Insufficient documentation

## 2013-10-16 DIAGNOSIS — Z8709 Personal history of other diseases of the respiratory system: Secondary | ICD-10-CM | POA: Insufficient documentation

## 2013-10-16 DIAGNOSIS — R072 Precordial pain: Secondary | ICD-10-CM | POA: Insufficient documentation

## 2013-10-16 DIAGNOSIS — D649 Anemia, unspecified: Secondary | ICD-10-CM

## 2013-10-16 DIAGNOSIS — I2510 Atherosclerotic heart disease of native coronary artery without angina pectoris: Secondary | ICD-10-CM | POA: Insufficient documentation

## 2013-10-16 DIAGNOSIS — Z862 Personal history of diseases of the blood and blood-forming organs and certain disorders involving the immune mechanism: Secondary | ICD-10-CM | POA: Insufficient documentation

## 2013-10-16 DIAGNOSIS — Z7982 Long term (current) use of aspirin: Secondary | ICD-10-CM | POA: Insufficient documentation

## 2013-10-16 DIAGNOSIS — Z8739 Personal history of other diseases of the musculoskeletal system and connective tissue: Secondary | ICD-10-CM | POA: Insufficient documentation

## 2013-10-16 DIAGNOSIS — E7800 Pure hypercholesterolemia, unspecified: Secondary | ICD-10-CM | POA: Insufficient documentation

## 2013-10-16 DIAGNOSIS — M129 Arthropathy, unspecified: Secondary | ICD-10-CM | POA: Insufficient documentation

## 2013-10-16 DIAGNOSIS — I10 Essential (primary) hypertension: Secondary | ICD-10-CM | POA: Insufficient documentation

## 2013-10-16 DIAGNOSIS — E785 Hyperlipidemia, unspecified: Secondary | ICD-10-CM | POA: Insufficient documentation

## 2013-10-16 DIAGNOSIS — Z8719 Personal history of other diseases of the digestive system: Secondary | ICD-10-CM | POA: Insufficient documentation

## 2013-10-16 DIAGNOSIS — Z8742 Personal history of other diseases of the female genital tract: Secondary | ICD-10-CM | POA: Insufficient documentation

## 2013-10-16 HISTORY — DX: Overactive bladder: N32.81

## 2013-10-16 LAB — CBC WITH DIFFERENTIAL/PLATELET
BASOS ABS: 0 10*3/uL (ref 0.0–0.1)
Basophils Relative: 1 % (ref 0–1)
EOS ABS: 0.2 10*3/uL (ref 0.0–0.7)
EOS PCT: 5 % (ref 0–5)
HCT: 32.2 % — ABNORMAL LOW (ref 36.0–46.0)
Hemoglobin: 10.6 g/dL — ABNORMAL LOW (ref 12.0–15.0)
LYMPHS ABS: 1.5 10*3/uL (ref 0.7–4.0)
LYMPHS PCT: 41 % (ref 12–46)
MCH: 32 pg (ref 26.0–34.0)
MCHC: 32.9 g/dL (ref 30.0–36.0)
MCV: 97.3 fL (ref 78.0–100.0)
Monocytes Absolute: 0.4 10*3/uL (ref 0.1–1.0)
Monocytes Relative: 11 % (ref 3–12)
NEUTROS PCT: 42 % — AB (ref 43–77)
Neutro Abs: 1.6 10*3/uL — ABNORMAL LOW (ref 1.7–7.7)
Platelets: 225 10*3/uL (ref 150–400)
RBC: 3.31 MIL/uL — AB (ref 3.87–5.11)
RDW: 14.5 % (ref 11.5–15.5)
WBC: 3.7 10*3/uL — AB (ref 4.0–10.5)

## 2013-10-16 LAB — TROPONIN I
Troponin I: <0.3 ng/mL (ref <0.30)
Troponin I: <0.3 ng/mL (ref <0.30)

## 2013-10-16 LAB — BASIC METABOLIC PANEL
BUN: 26 mg/dL — ABNORMAL HIGH (ref 6–23)
CALCIUM: 9.4 mg/dL (ref 8.4–10.5)
CO2: 28 mEq/L (ref 19–32)
Chloride: 99 mEq/L (ref 96–112)
Creatinine, Ser: 0.8 mg/dL (ref 0.50–1.10)
GFR calc Af Amer: 79 mL/min — ABNORMAL LOW (ref >90)
GFR, EST NON AFRICAN AMERICAN: 68 mL/min — AB (ref >90)
GLUCOSE: 90 mg/dL (ref 70–99)
Potassium: 4 mEq/L (ref 3.7–5.3)
Sodium: 138 mEq/L (ref 137–147)

## 2013-10-16 NOTE — Discharge Instructions (Signed)
Angina Pectoris  Angina pectoris, often just called angina, is extreme discomfort in your chest, neck, or arm caused by a lack of blood in the middle and thickest layer of your heart wall (myocardium). It may feel like tightness or heavy pressure. It may feel like a crushing or squeezing pain. Some people say it feels like gas or indigestion. It may go down your shoulders, back, and arms. Some people may have symptoms other than pain. These symptoms include fatigue, shortness of breath, cold sweats, or nausea. There are four different types of angina:   Stable angina Stable angina usually occurs in episodes of predictable frequency and duration. It usually is brought on by physical activity, emotional stress, or excitement. These are all times when the myocardium needs more oxygen. Stable angina usually lasts a few minutes and often is relieved by taking a medicine that can be taken under your tongue (sublingually). The medicine is called nitroglycerin. Stable angina is caused by a buildup of plaque inside the arteries, which restricts blood flow to the heart muscle (atherosclerosis).   Unstable angina Unstable angina can occur even when your body experiences little or no physical exertion. It can occur during sleep. It can also occur at rest. It can suddenly increase in severity or frequency. It might not be relieved by sublingual nitroglycerin. It can last up to 30 minutes. The most common cause of unstable angina is a blood clot that has developed on the top of plaque buildup inside a coronary artery. It can lead to a heart attack if the blood clot completely blocks the artery.   Microvascular angina This type of angina is caused by a disorder of tiny blood vessels called arterioles. Microvascular angina is more common in women. The pain may be more severe and last longer than other types of angina pectoris.   Prinzmetal or variant angina This type of angina pectoris usually occurs when your body experiences  little or no physical exertion. It especially occurs in the early morning hours. It is caused by a spasm of your coronary artery.  HOME CARE INSTRUCTIONS    Only take over-the-counter and prescription medicines as directed by your caregiver.   Stay active or increase your exercise as directed by your caregiver.   Limit strenuous activity as directed by your caregiver.   Limit heavy lifting as directed by your caregiver.   Maintain a healthy weight.   Learn about and eat heart-healthy foods.   Do not smoke.  SEEK IMMEDIATE MEDICAL CARE IF:   You experience the following symptoms:   Chest, neck, deep shoulder, or arm pain or discomfort that lasts more than a few minutes.   Chest, neck, deep shoulder, or arm pain or discomfort that goes away and comes back, repeatedly.   Heavy sweating with discomfort, without a noticeable cause.   Shortness of breath or difficulty breathing.   Angina that does not get better after a few minutes of rest or after taking sublingual nitroglycerin.  These can all be symptoms of a heart attack, which is a medical emergency! Get medical help at once. Call your local emergency service (911 in [place] immediately. Do not  drive yourself to the hospital and do not  wait to for your symptoms to go away.  MAKE SURE YOU:   Understand these instructions.   Will watch your condition.   Will get help right away if you are not doing well or get worse.  Document Released: 05/26/2005 Document Revised: 05/12/2012 Document Reviewed: 

## 2013-10-16 NOTE — ED Notes (Signed)
Pt awakened from sleep by central chest pain that was "steady and strong" relieved by NTG x 3 prior to EMS arrival.  Pt in no distress now; denies pain.  Reports frequent and increasing angina.

## 2013-10-16 NOTE — ED Notes (Signed)
Pt. In bed appears to be asleep,breathing even and unlabored.

## 2013-10-16 NOTE — ED Provider Notes (Signed)
CSN: [id number]     Arrival date & time 10/16/13  0131 History   First MD Initiated Contact with Patient 10/16/13 0132     Chief Complaint  Patient presents with  . Chest Pain     (Consider location/radiation/quality/duration/timing/severity/associated sxs/prior Treatment) Patient is a 80 y.o. female presenting with chest pain. The history is provided by the patient.  Chest Pain She was awakened at about midnight by a mid sternal chest pain. She is unable to describe it other than that was steady and strong. She took aspirin and 3 nitroglycerin with complete relief of pain within about 15 minutes and when it started. She does have history of angina which is manifests itself when walking too quickly after eating dinner. She's never had any nocturnal symptoms. She states that this pain is distinctly different from her angina pain. There is no associated dyspnea, nausea, diaphoresis. There is no radiation of pain. Of note, patient did not have her usual dinner tonight. She had traveled back from [place] and Mr. dinner and she had pizza for dinner. She does not usually eat pizza.  Past Medical History  Diagnosis Date  . Hx of CABG   . Hypertension   . GERD (gastroesophageal reflux disease)   . Anxiety   . Coronary artery disease   . Arthritis   . Anginal pain   . Shortness of breath   . Intermediate coronary syndrome   . Raynauds syndrome   . Coronary atherosclerosis of unspecified type of vessel, native or graft   . Coronary atherosclerosis of autologous vein bypass graft   . Spinal stenosis of lumbar region   . Hypercholesteremia   . Hyperlipidemia     GOAL LDL <70  . Panic disorder   . Osteopenia   . Multiple myeloma     Dr. Benay Spice  . Mild chronic anemia   . Urine test positive for microalbuminuria 12/2011  . Ovarian cyst, bilateral     Rt complex  . Allergic rhinitis   . Atypical chest pain 04/25/13    Doing better, does occasionally respond to Nitroglycerin   Past Surgical  History  Procedure Laterality Date  . Tonsillectomy and adenoidectomy    . Dilation and curettage of uterus    . Eye surgery      both cataracts  . Colonoscopy    . Upper gastrointestinal endoscopy    . Capsulotomy Right 08/04/2012    Procedure: CAPSULOTOMY;  Surgeon: Colin Rhein, MD;  Location: [facility];  Service: Orthopedics;  Laterality: Right;  RIGHT 2ND TOE METATARSOPHALANGEAL JOINT DORSAL CAPSULOTOMY   . Bunionectomy with hammertoe reconstruction Right 08/04/2012    Procedure:  HAMMERTOE RECONSTRUCTION;  Surgeon: Colin Rhein, MD;  Location: [place];  Service: Orthopedics;  Laterality: Right;  HAMMER TOE RECONSTRUCTION PROBABLE FLEXOR DIGITORUM LONGUS TO PROXIMAL PHALANX TRANSFER LATERALIZED   . Coronary artery bypass graft  2007    LIMA to LAD, SVG to RCA  non-ST segment MI 08/2009 (Dr. Tamala Julian)  . Hammer toe surgery Right 08/2012  . Cataract extraction, bilateral    . Coronary angioplasty with stent placement  2014    2 stents   Family History  Problem Relation Age of Onset  . Heart attack Mother   . CAD Mother   . Obesity Sister   . Cancer Paternal Aunt     breast   History  Substance Use Topics  . Smoking status: Never Smoker   . Smokeless tobacco: Never Used  .  Alcohol Use: Yes     Comment: occasional glass wine   OB History   Grav Para Term Preterm Abortions TAB SAB Ect Mult Living                 Review of Systems  Cardiovascular: Positive for chest pain.  All other systems reviewed and are negative.     Allergies  Levaquin; Cephalosporins; Cilostazol; Codeine; Dexilant; Erythromycin; Food; Macrodantin; Morphine and related; Simvastatin; Sulfa antibiotics; Guy Begin; Voltaren; Butazolidin; Ibuprofen; and Penicillins  Home Medications   Prior to Admission medications   Medication Sig Start Date End Date Taking? Authorizing Provider  ARTIFICIAL TEAR OP Apply 1 drop to eye daily as needed. For dry eyes     Historical Provider, MD  aspirin 325 MG tablet Take 325 mg by mouth daily.    Historical Provider, MD  Biotin 5000 MCG CAPS Take 5,000 mcg by mouth every evening.     Historical Provider, MD  co-enzyme Q-10 30 MG capsule Take 100 mg by mouth daily.    Historical Provider, MD  DULoxetine (CYMBALTA) 30 MG capsule Take 1 capsule (30 mg total) by mouth daily. 06/26/13   Orlie Dakin, MD  ESTRACE VAGINAL 0.1 MG/GM vaginal cream  07/20/13   Historical Provider, MD  fluticasone (FLONASE) 50 MCG/ACT nasal spray Place 2 sprays into the nose daily as needed. For allergies    Historical Provider, MD  isosorbide mononitrate (IMDUR) 60 MG 24 hr tablet Take 1 tablet (60 mg total) by mouth daily. 01/14/13   Delfina Redwood, MD  LORazepam (ATIVAN) 0.5 MG tablet Take 1 tablet (0.5 mg total) by mouth at bedtime. 01/14/13   Delfina Redwood, MD  metoprolol tartrate (LOPRESSOR) 25 MG tablet TAKE ONE TABLET TWICE DAILY 07/13/13   Belva Crome III, MD  mirabegron ER (MYRBETRIQ) 50 MG TB24 Take 50 mg by mouth daily. Takes 25 mg    Historical Provider, MD  Multiple Vitamin (MULITIVITAMIN WITH MINERALS) TABS Take 1 tablet by mouth daily.    Historical Provider, MD  NITROSTAT 0.4 MG SL tablet DISSOLVE 1 TABLET UNDER TONGUE EVERY 5 MINUTES UP TO 3 DOSES AS NEEDED FOR CHEST PAIN. IF NO RELIEF CALL 911. 08/31/13   Belva Crome III, MD  NON FORMULARY Take 30 mLs by mouth daily. [place]ag[place] Apple Cider Vinegar in 6 oz water (mixed with honey)    Historical Provider, MD  NON FORMULARY Take by mouth at bedtime. [place] Provider, MD  polyethylene glycol (MIRALAX / GLYCOLAX) packet Take 17 g by mouth at bedtime.    Historical Provider, MD  rosuvastatin (CRESTOR) 20 MG tablet Take 20 mg by mouth daily.    Historical Provider, MD  SALINE NASAL SPRAY NA Place 3-4 sprays into the nose 2 (two) times daily as needed (for dryness).     Historical Provider, MD  trimethoprim (TRIMPEX) 100 MG tablet Take 100 mg by mouth daily.     Historical Provider, MD  UNABLE TO FIND Inject into the skin once a week. Med Name: [facility] to right ankle (lasts 30 minutes)    Historical Provider, MD   BP 169/66  Pulse 65  Temp(Src) 98.6 F (37 C) (Oral)  Ht '5\' 3"'  (1.6 m)  Wt 110 lb (49.896 kg)  BMI 19.49 kg/m2  SpO2 96% Physical Exam  Nursing note and vitals reviewed.  80 year old female, resting comfortably and in no acute distress. Vital signs are  significant for hypertension with blood pressure 169/66. Oxygen saturation is 96%, which is normal. Head is normocephalic and atraumatic. PERRLA, EOMI. Oropharynx is clear. Neck is nontender and supple without adenopathy or JVD. Back is nontender and there is no CVA tenderness. Lungs are clear without rales, wheezes, or rhonchi. Chest is nontender. Heart has regular rate and rhythm without murmur. Abdomen is soft, flat, nontender without masses or hepatosplenomegaly and peristalsis is normoactive. Extremities have no cyanosis or edema, full range of motion is present. Skin is warm and dry without rash. Neurologic: Mental status is normal, cranial nerves are intact, there are no motor or sensory deficits.  ED Course  Procedures (including critical care time) Labs Review Results for orders placed during the hospital encounter of 10/16/13  CBC WITH DIFFERENTIAL      Result Value Ref Range   WBC 3.7 (*) 4.0 - 10.5 K/uL   RBC 3.31 (*) 3.87 - 5.11 MIL/uL   Hemoglobin 10.6 (*) 12.0 - 15.0 g/dL   HCT 32.2 (*) 36.0 - 46.0 %   MCV 97.3  78.0 - 100.0 fL   MCH 32.0  26.0 - 34.0 pg   MCHC 32.9  30.0 - 36.0 g/dL   RDW 14.5  11.5 - 15.5 %   Platelets 225  150 - 400 K/uL   Neutrophils Relative % 42 (*) 43 - 77 %   Neutro Abs 1.6 (*) 1.7 - 7.7 K/uL   Lymphocytes Relative 41  12 - 46 %   Lymphs Abs 1.5  0.7 - 4.0 K/uL   Monocytes Relative 11  3 - 12 %   Monocytes Absolute 0.4  0.1 - 1.0 K/uL   Eosinophils Relative 5  0 - 5 %   Eosinophils Absolute 0.2   0.0 - 0.7 K/uL   Basophils Relative 1  0 - 1 %   Basophils Absolute 0.0  0.0 - 0.1 K/uL  BASIC METABOLIC PANEL      Result Value Ref Range   Sodium 138  137 - 147 mEq/L   Potassium 4.0  3.7 - 5.3 mEq/L   Chloride 99  96 - 112 mEq/L   CO2 28  19 - 32 mEq/L   Glucose, Bld 90  70 - 99 mg/dL   BUN 26 (*) 6 - 23 mg/dL   Creatinine, Ser 0.80  0.50 - 1.10 mg/dL   Calcium 9.4  8.4 - 10.5 mg/dL   GFR calc non Af Amer 68 (*) >90 mL/min   GFR calc Af Amer 79 (*) >90 mL/min  TROPONIN I      Result Value Ref Range   Troponin I <0.30  <0.30 ng/mL  TROPONIN I      Result Value Ref Range   Troponin I <0.30  <0.30 ng/mL  TROPONIN I      Result Value Ref Range   Troponin I <0.30  <0.30 ng/mL    Date: 10/16/2013  Rate: 63  Rhythm: normal sinus rhythm  QRS Axis: normal  Intervals: normal  ST/T Wave abnormalities: normal  Conduction Disutrbances:none  Narrative Interpretation: Normal ECG. When compared with ECG of 01/13/2013, ST depression in anterolateral leads is no longer present.  Old EKG Reviewed: changes noted   MDM   Final diagnoses:  Chest pain  Anemia    Chest pain which may represent angina but seems more likely to be GI in origin old records are reviewed and she is status post coronary artery bypass and has had a non-STEMI and has been followed by  cardiology with stable angina pectoris. Since she is pain-free, she will be observed in the ED for serial troponins. If she has normal troponin 6 hours after pain resolved, she will be able to be discharged to followup with her cardiologist.  7:44 AM She has been observed in the ED overnight. There's been no recurrence of chest pain. Troponins are negative x3. She is discharged with instructions to followup with her cardiologist in the next several days.  Delora Fuel, MD 48/01/65 [id number]

## 2013-10-20 ENCOUNTER — Telehealth: Payer: Self-pay | Admitting: *Deleted

## 2013-10-20 NOTE — Telephone Encounter (Signed)
Call from [place] at [facility] requesting diagnosis codes for CBC SPEP and light chains. 203.00 multiple myeloma. Labs to be drawn in January for office visit.

## 2013-12-12 ENCOUNTER — Telehealth: Payer: Self-pay | Admitting: *Deleted

## 2013-12-12 NOTE — Telephone Encounter (Signed)
Message from pt to make Dr. Benay Spice aware she is scheduled to see Dr Denman George 7/10 for evaluation of an enlarging ovarian cyst- referred by GYN. MD made aware.

## 2013-12-12 NOTE — Telephone Encounter (Signed)
Pt called [facility] with concern if appt will last more than 1 1/2 hrs. LMOVM for pt, appt is for 30 mins, she should not need to cancel prior engagement at 1200.

## 2013-12-16 ENCOUNTER — Encounter: Payer: Self-pay | Admitting: Gynecologic Oncology

## 2013-12-16 ENCOUNTER — Ambulatory Visit: Payer: Medicare Other | Attending: Gynecologic Oncology | Admitting: Gynecologic Oncology

## 2013-12-16 VITALS — BP 156/54 | HR 64 | Temp 97.6°F | Resp 16 | Ht 62.0 in | Wt 109.5 lb

## 2013-12-16 DIAGNOSIS — N83201 Unspecified ovarian cyst, right side: Secondary | ICD-10-CM

## 2013-12-16 DIAGNOSIS — Z79899 Other long term (current) drug therapy: Secondary | ICD-10-CM | POA: Insufficient documentation

## 2013-12-16 DIAGNOSIS — N83202 Unspecified ovarian cyst, left side: Secondary | ICD-10-CM

## 2013-12-16 DIAGNOSIS — I200 Unstable angina: Secondary | ICD-10-CM | POA: Insufficient documentation

## 2013-12-16 DIAGNOSIS — C9000 Multiple myeloma not having achieved remission: Secondary | ICD-10-CM | POA: Insufficient documentation

## 2013-12-16 DIAGNOSIS — F411 Generalized anxiety disorder: Secondary | ICD-10-CM | POA: Insufficient documentation

## 2013-12-16 DIAGNOSIS — Z803 Family history of malignant neoplasm of breast: Secondary | ICD-10-CM | POA: Insufficient documentation

## 2013-12-16 DIAGNOSIS — I2510 Atherosclerotic heart disease of native coronary artery without angina pectoris: Secondary | ICD-10-CM | POA: Insufficient documentation

## 2013-12-16 DIAGNOSIS — Z951 Presence of aortocoronary bypass graft: Secondary | ICD-10-CM | POA: Insufficient documentation

## 2013-12-16 DIAGNOSIS — N83292 Other ovarian cyst, left side: Secondary | ICD-10-CM

## 2013-12-16 DIAGNOSIS — K219 Gastro-esophageal reflux disease without esophagitis: Secondary | ICD-10-CM | POA: Insufficient documentation

## 2013-12-16 DIAGNOSIS — N83209 Unspecified ovarian cyst, unspecified side: Secondary | ICD-10-CM | POA: Insufficient documentation

## 2013-12-16 DIAGNOSIS — I252 Old myocardial infarction: Secondary | ICD-10-CM | POA: Insufficient documentation

## 2013-12-16 DIAGNOSIS — E785 Hyperlipidemia, unspecified: Secondary | ICD-10-CM | POA: Insufficient documentation

## 2013-12-16 DIAGNOSIS — I10 Essential (primary) hypertension: Secondary | ICD-10-CM | POA: Insufficient documentation

## 2013-12-16 NOTE — Progress Notes (Signed)
Consult Note: Gyn-Onc  Consult was requested by Dr. Simona Huh for the evaluation of [name] 80 y.o. female for right ovarian cyst which is increasing in size.  CC:  Chief Complaint  Patient presents with  . Complex cyst    New Consult    Assessment/Plan:  [name]  is a 80 y.o.  year old woman who is seen in consultation at the request of Dr. Barkley Boards for bilateral ovarian cysts. The cysts are asymptomatic and a larger cyst on the right showed only subtle growth over a three-month period. His CA 125 is low and normal. I discussed with [name] that I do not believe that her  ovarian cysts represent malignancy. I discussed that the only definitive way to determine this is with a surgical procedure and bilateral salpingo-oophorectomy. At this time I believe that the risks of such a procedure, particularly in a woman with a significant cardiac history, outweighed the benefits or the likelihood that an occult malignancy will be discovered. Therefore I am recommending ongoing surveillance. Because of the gland and overall benign nature appearing nature of these lesions I believe that it is reasonable to space surveillance checks to 6 monthly transvaginal ultrasounds. An indication to proceed with laparoscopic nephrectomy might be doubling in size of the assessed of a such a period of time or measurement of assessed greater than 6 cm in size or new onset of pelvic symptoms or mass effect.  HPI: [name] is a 80 year old P4 who is seen in consultation at the request of Dr. Barkley Boards for bilateral ovarian cysts which are increasing in size. These cysts are  symptomatic in nature. There were detected 3 years ago by Dr.Vanardo during an annual pelvic exam. This prompted a pelvic ultrasound, during which time that the cysts were identified, and subsequently followed with serial scans. In March of 2015 the right cyst was 2.4 x 1.7 x 1.6 cm in dimension. On 11/22/2013 a repeat ultrasound showed  a right ovarian cyst measuring 2.9 x 2.2 x 2.0 cm representing a subtle growth over a three-month period. A single thin septation was found with an cyst. The left ovary contained a simple cyst measuring 1.2 x 1.1 x 1.1 cm. This was stable in size from prior exam. Both cysts or avascular inapparent. CA 125 was normal at 10.1 on 11/24/2013. She reports no postmenopausal bleeding. Menopause was at each 52. Her family history is significant only for a paternal aunt with breast cancer in a postmenopausal age. She is a never smoker however has his significant cardiac history which includes multiple myocardial infarctions and a CABG procedure in 2007. She sees Dr. Daneen Schick for her cardiology care. She also has a history of multiple myeloma and sees Dr. Benay Spice for this. She has not received therapy for her multiple myeloma and is being conservatively managed.  She is recently remarried and is sexually active and reports no dyspareunia. She reports no bloating early satiety change in abdominal girth or change in bladder or bowel function.  Interval History: Since repeat ultrasound scan in June [name] reports no change in symptoms status.  Current Meds:  Outpatient Encounter Prescriptions as of 12/16/2013  Medication Sig  . aspirin 325 MG tablet Take 325 mg by mouth daily.  . Biotin 5000 MCG CAPS Take 5,000 mcg by mouth every evening.   . calcium carbonate (OS-CAL) 600 MG TABS tablet Take 600 mg by mouth daily with breakfast.  . DULoxetine (CYMBALTA) 30 MG capsule Take  1 capsule (30 mg total) by mouth daily.  Marland Kitchen estradiol (ESTRACE) 0.1 MG/GM vaginal cream Place 1 Applicatorful vaginally once a week. Sunday  . fluticasone (FLONASE) 50 MCG/ACT nasal spray Place 2 sprays into the nose daily as needed. For allergies  . isosorbide mononitrate (IMDUR) 60 MG 24 hr tablet Take 1 tablet (60 mg total) by mouth daily.  Marland Kitchen LORazepam (ATIVAN) 0.5 MG tablet Take 0.25 mg by mouth at bedtime.  . metoprolol tartrate  (LOPRESSOR) 25 MG tablet Take 25 mg by mouth 2 (two) times daily.  . mirabegron ER (MYRBETRIQ) 50 MG TB24 tablet Take 25 mg by mouth daily.  . Multiple Vitamin (MULITIVITAMIN WITH MINERALS) TABS Take 1 tablet by mouth daily.  . nitroGLYCERIN (NITROSTAT) 0.4 MG SL tablet Place 0.4 mg under the tongue every 5 (five) minutes as needed for chest pain.  . NON FORMULARY Take 30 mLs by mouth daily. Braggs Apple Cider Vinegar in 6 oz water (mixed with honey)  . Polyethyl Glycol-Propyl Glycol (SYSTANE OP) Apply 1 drop to eye daily as needed (for dry eyes).  . polyethylene glycol (MIRALAX / GLYCOLAX) packet Take 17 g by mouth at bedtime.  . rosuvastatin (CRESTOR) 40 MG tablet Take 20 mg by mouth daily.  Marland Kitchen SALINE NASAL SPRAY NA Place 3-4 sprays into the nose 2 (two) times daily as needed (for dryness).   Marland Kitchen UNABLE TO FIND Inject into the skin once a week. Med Name: [facility] right [facility] (lasts 30 minutes)  . co-enzyme Q-10 30 MG capsule Take 100 mg by mouth daily.    Allergy:  Allergies  Allergen Reactions  . Levaquin [Levofloxacin] Shortness Of Breath  . Cephalosporins Other (See Comments)    unknown  . Cilostazol Other (See Comments)    Stomach upset  . Codeine Nausea Only  . Dexilant [Dexlansoprazole] Diarrhea  . Erythromycin Nausea And Vomiting    All "mycin" drugs  . Food Other (See Comments)    No spicy foods or black pepper, raw onions - causes gerd  . Macrodantin Other (See Comments)    Double vision  . Morphine And Related Other (See Comments)    hallucinations  . Simvastatin Other (See Comments)    Muscle aches  . Sulfa Antibiotics Other (See Comments)    unknown  . Toviaz [Fesoterodine Fumarate Er] Other (See Comments)    unknown  . Trimethoprim Nausea Only  . Vesicare [Solifenacin] Other (See Comments)    unknown  . Butazolidin [Phenylbutazone] Swelling and Rash  . Ibuprofen Hives and Rash  . Penicillins Rash  . Voltaren [Diclofenac Sodium]  Rash    Social Hx:   History   Social History  . Marital Status: Widowed    Spouse Name: N/A    Number of Children: N/A  . Years of Education: N/A   Occupational History  . Not on file.   Social History Main Topics  . Smoking status: Never Smoker   . Smokeless tobacco: Never Used  . Alcohol Use: Yes     Comment: occasional glass wine  . Drug Use: No  . Sexual Activity: Not on file   Other Topics Concern  . Not on file   Social History Narrative  . No narrative on file    Past Surgical Hx:  Past Surgical History  Procedure Laterality Date  . Tonsillectomy and adenoidectomy    . Dilation and curettage of uterus    . Eye surgery  both cataracts  . Colonoscopy    . Upper gastrointestinal endoscopy    . Capsulotomy Right 08/04/2012    Procedure: CAPSULOTOMY;  Surgeon: Colin Rhein, MD;  Location: [facility];  Service: Orthopedics;  Laterality: Right;  RIGHT 2ND TOE METATARSOPHALANGEAL JOINT DORSAL CAPSULOTOMY   . Bunionectomy with hammertoe reconstruction Right 08/04/2012    Procedure:  HAMMERTOE RECONSTRUCTION;  Surgeon: Colin Rhein, MD;  Location: [facility];  Service: Orthopedics;  Laterality: Right;  HAMMER TOE RECONSTRUCTION PROBABLE FLEXOR DIGITORUM LONGUS TO PROXIMAL PHALANX TRANSFER LATERALIZED   . Coronary artery bypass graft  2007    LIMA to LAD, SVG to RCA  non-ST segment MI 08/2009 (Dr. Tamala Julian)  . Hammer toe surgery Right 08/2012  . Cataract extraction, bilateral    . Coronary angioplasty with stent placement  2014    2 stents    Past Medical Hx:  Past Medical History  Diagnosis Date  . Hx of CABG   . Hypertension   . GERD (gastroesophageal reflux disease)   . Anxiety   . Coronary artery disease   . Arthritis   . Anginal pain   . Shortness of breath   . Intermediate coronary syndrome   . Raynauds syndrome   . Coronary atherosclerosis of unspecified type of vessel, native or graft   . Coronary atherosclerosis of  autologous vein bypass graft   . Spinal stenosis of lumbar region   . Hypercholesteremia   . Hyperlipidemia     GOAL LDL <70  . Panic disorder   . Osteopenia   . Multiple myeloma     Dr. Benay Spice  . Mild chronic anemia   . Urine test positive for microalbuminuria 12/2011  . Ovarian cyst, bilateral     Rt complex  . Allergic rhinitis   . Atypical chest pain 04/25/13    Doing better, does occasionally respond to Nitroglycerin  . Overactive bladder     Past Gynecological History:  Menopause at age 5. No prior history of gynecologic surgery gravida maladies. 4 vaginal deliveries. No LMP recorded. Patient is postmenopausal.  Family Hx:  Family History  Problem Relation Age of Onset  . Heart attack Mother   . CAD Mother   . Obesity Sister   . Cancer Paternal Aunt     breast    Review of Systems:  Constitutional  Feels well,    ENT Normal appearing ears and nares bilaterally Skin/Breast  No rash, sores, jaundice, itching, dryness Cardiovascular  No chest pain, shortness of breath, or edema  Pulmonary  No cough or wheeze.  Gastro Intestinal  No nausea, vomitting, or diarrhoea. No bright red blood per rectum, no abdominal pain, change in bowel movement, or constipation.  Genito Urinary  No frequency, urgency, dysuria, see HPI  Musculo Skeletal  No myalgia, arthralgia, joint swelling or pain  Neurologic  No weakness, numbness, change in gait,  Psychology  No depression, anxiety, insomnia.   Vitals:  Blood pressure 156/54, pulse 64, temperature 97.6 F (36.4 C), temperature source Oral, resp. rate 16, height _0  (1.575 m), weight 109 lb 8 oz (49.669 kg).  Physical Exam: WD in NAD Neck  Supple NROM, without any enlargements.  Lymph Node Survey No cervical supraclavicular or inguinal adenopathy Cardiovascular  Pulse normal rate, regularity and rhythm. S1 and S2 normal.  Lungs  Clear to auscultation bilateraly, without wheezes/crackles/rhonchi. Good air movement.   Skin  No rash/lesions/breakdown  Psychiatry  Alert and oriented to person, place, and  time  Abdomen  Normoactive bowel sounds, abdomen soft, non-tender and obese without evidence of hernia. No prior incisions Back No CVA tenderness Genito Urinary  Vulva/vagina: Normal external female genitalia.   No lesions. No discharge or bleeding.  Bladder/urethra:  No lesions or masses, well supported bladder  Vagina: postmenopausal, atrophic  Cervix: Normal appearing, no lesions.  Uterus:  Small, mobile, no parametrial involvement or nodularity.  Adnexa: No palpable masses. Rectal  Good tone, no masses no cul de sac nodularity.  Extremities  No bilateral cyanosis, clubbing or edema.   _0 @ 12/16/2013, 10:40 AM

## 2014-01-03 ENCOUNTER — Encounter: Payer: Self-pay | Admitting: Oncology

## 2014-05-18 ENCOUNTER — Encounter (HOSPITAL_COMMUNITY): Payer: Self-pay | Admitting: Interventional Cardiology

## 2014-06-06 ENCOUNTER — Telehealth: Payer: Self-pay | Admitting: Oncology

## 2014-06-06 NOTE — Telephone Encounter (Signed)
moved 1/12 appt to 12/28 due to call day. s/w pt husband he is aware and will have pt call back to r/s if this day is not good for her.

## 2014-06-15 ENCOUNTER — Emergency Department (HOSPITAL_BASED_OUTPATIENT_CLINIC_OR_DEPARTMENT_OTHER): Payer: No Typology Code available for payment source

## 2014-06-15 ENCOUNTER — Emergency Department (HOSPITAL_BASED_OUTPATIENT_CLINIC_OR_DEPARTMENT_OTHER)
Admission: EM | Admit: 2014-06-15 | Discharge: 2014-06-15 | Disposition: A | Discharge status: Home / Self Care | Payer: No Typology Code available for payment source | Attending: Emergency Medicine | Admitting: Emergency Medicine

## 2014-06-15 ENCOUNTER — Encounter (HOSPITAL_BASED_OUTPATIENT_CLINIC_OR_DEPARTMENT_OTHER): Payer: Self-pay

## 2014-06-15 DIAGNOSIS — Z880 Allergy status to penicillin: Secondary | ICD-10-CM | POA: Diagnosis not present

## 2014-06-15 DIAGNOSIS — K219 Gastro-esophageal reflux disease without esophagitis: Secondary | ICD-10-CM | POA: Diagnosis not present

## 2014-06-15 DIAGNOSIS — F410 Panic disorder [episodic paroxysmal anxiety] without agoraphobia: Secondary | ICD-10-CM | POA: Insufficient documentation

## 2014-06-15 DIAGNOSIS — M1990 Unspecified osteoarthritis, unspecified site: Secondary | ICD-10-CM | POA: Insufficient documentation

## 2014-06-15 DIAGNOSIS — I2510 Atherosclerotic heart disease of native coronary artery without angina pectoris: Secondary | ICD-10-CM | POA: Insufficient documentation

## 2014-06-15 DIAGNOSIS — Y9389 Activity, other specified: Secondary | ICD-10-CM | POA: Diagnosis not present

## 2014-06-15 DIAGNOSIS — Z862 Personal history of diseases of the blood and blood-forming organs and certain disorders involving the immune mechanism: Secondary | ICD-10-CM | POA: Diagnosis not present

## 2014-06-15 DIAGNOSIS — Z9889 Other specified postprocedural states: Secondary | ICD-10-CM | POA: Diagnosis not present

## 2014-06-15 DIAGNOSIS — Z79899 Other long term (current) drug therapy: Secondary | ICD-10-CM | POA: Diagnosis not present

## 2014-06-15 DIAGNOSIS — S20211A Contusion of right front wall of thorax, initial encounter: Secondary | ICD-10-CM | POA: Insufficient documentation

## 2014-06-15 DIAGNOSIS — Z7951 Long term (current) use of inhaled steroids: Secondary | ICD-10-CM | POA: Insufficient documentation

## 2014-06-15 DIAGNOSIS — R079 Chest pain, unspecified: Secondary | ICD-10-CM

## 2014-06-15 DIAGNOSIS — Y998 Other external cause status: Secondary | ICD-10-CM | POA: Insufficient documentation

## 2014-06-15 DIAGNOSIS — I10 Essential (primary) hypertension: Secondary | ICD-10-CM | POA: Insufficient documentation

## 2014-06-15 DIAGNOSIS — Z7982 Long term (current) use of aspirin: Secondary | ICD-10-CM | POA: Insufficient documentation

## 2014-06-15 DIAGNOSIS — Z85820 Personal history of malignant melanoma of skin: Secondary | ICD-10-CM | POA: Insufficient documentation

## 2014-06-15 DIAGNOSIS — E785 Hyperlipidemia, unspecified: Secondary | ICD-10-CM | POA: Insufficient documentation

## 2014-06-15 DIAGNOSIS — E780 Pure hypercholesterolemia: Secondary | ICD-10-CM | POA: Insufficient documentation

## 2014-06-15 DIAGNOSIS — Y92410 Unspecified street and highway as the place of occurrence of the external cause: Secondary | ICD-10-CM | POA: Diagnosis not present

## 2014-06-15 DIAGNOSIS — Z8742 Personal history of other diseases of the female genital tract: Secondary | ICD-10-CM | POA: Diagnosis not present

## 2014-06-15 DIAGNOSIS — Z87448 Personal history of other diseases of urinary system: Secondary | ICD-10-CM | POA: Insufficient documentation

## 2014-06-15 DIAGNOSIS — Z955 Presence of coronary angioplasty implant and graft: Secondary | ICD-10-CM | POA: Diagnosis not present

## 2014-06-15 DIAGNOSIS — Z8709 Personal history of other diseases of the respiratory system: Secondary | ICD-10-CM | POA: Diagnosis not present

## 2014-06-15 DIAGNOSIS — M8580 Other specified disorders of bone density and structure, unspecified site: Secondary | ICD-10-CM | POA: Diagnosis not present

## 2014-06-15 DIAGNOSIS — S29091A Other injury of muscle and tendon of front wall of thorax, initial encounter: Secondary | ICD-10-CM | POA: Diagnosis present

## 2014-06-15 LAB — URINALYSIS, ROUTINE W REFLEX MICROSCOPIC
Bilirubin Urine: NEGATIVE
GLUCOSE, UA: NEGATIVE mg/dL
HGB URINE DIPSTICK: NEGATIVE
Ketones, ur: NEGATIVE mg/dL
Leukocytes, UA: NEGATIVE
Nitrite: NEGATIVE
PROTEIN: 30 mg/dL — AB
SPECIFIC GRAVITY, URINE: 1.01 (ref 1.005–1.030)
UROBILINOGEN UA: 0.2 mg/dL (ref 0.0–1.0)
pH: 7.5 (ref 5.0–8.0)

## 2014-06-15 LAB — URINE MICROSCOPIC-ADD ON

## 2014-06-15 MED ORDER — ACETAMINOPHEN 500 MG PO TABS: 500.0000 mg | ORAL_TABLET | Freq: Four times a day (QID) | ORAL | Status: DC | PRN

## 2014-06-15 MED ORDER — ACETAMINOPHEN 500 MG PO TABS
1000.0000 mg | ORAL_TABLET | Freq: Once | ORAL | Status: AC
  Administered 2014-06-15: 1000 mg via ORAL
  Filled 2014-06-15: qty 2
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
## 2014-06-15 NOTE — ED Notes (Signed)
Visitor and [employer] at bedside.

## 2014-06-20 ENCOUNTER — Ambulatory Visit: Payer: Medicare Other | Admitting: Oncology

## 2014-06-22 ENCOUNTER — Other Ambulatory Visit: Payer: Self-pay | Admitting: Geriatric Medicine

## 2014-06-22 ENCOUNTER — Ambulatory Visit
Admission: RE | Admit: 2014-06-22 | Discharge: 2014-06-22 | Disposition: A | Discharge status: Home / Self Care | Payer: PPO | Source: Ambulatory Visit | Attending: Geriatric Medicine | Admitting: Geriatric Medicine

## 2014-06-22 DIAGNOSIS — R0789 Other chest pain: Secondary | ICD-10-CM

## 2014-07-06 ENCOUNTER — Telehealth: Payer: Self-pay | Admitting: Oncology

## 2014-07-06 ENCOUNTER — Telehealth: Payer: Self-pay | Admitting: *Deleted

## 2014-07-06 ENCOUNTER — Ambulatory Visit (HOSPITAL_BASED_OUTPATIENT_CLINIC_OR_DEPARTMENT_OTHER): Payer: PPO | Admitting: Oncology

## 2014-07-06 VITALS — BP 158/55 | HR 67 | Temp 97.8°F | Resp 18 | Ht 62.0 in | Wt 114.5 lb

## 2014-07-06 DIAGNOSIS — D630 Anemia in neoplastic disease: Secondary | ICD-10-CM

## 2014-07-06 DIAGNOSIS — M549 Dorsalgia, unspecified: Secondary | ICD-10-CM

## 2014-07-06 DIAGNOSIS — C9000 Multiple myeloma not having achieved remission: Secondary | ICD-10-CM

## 2014-07-06 DIAGNOSIS — M8580 Other specified disorders of bone density and structure, unspecified site: Secondary | ICD-10-CM

## 2014-07-06 NOTE — Telephone Encounter (Signed)
S/w pt confirming MD visit per 01/28 POF, also advised I would mail ltr/schedule/order for DG Bone Survey per GI this is a walk in process but she would need to arrive before 3..... KJ
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
## 2014-07-06 NOTE — Telephone Encounter (Signed)
Pt given handwritten order to take back to [facility] assisted living facility for CBC/CMET/SPEP/Serum Light Chains and lipid panel to be done early Jan 2017 with results faxed to Dr. Benay Spice.

## 2014-07-17 NOTE — Telephone Encounter (Signed)
Lft msg for pt confirming Bone Survey per 01/28 POF, with GI at 2:00 arrival time 1:40 bringing ins car/photo ID right after her visit with Dr. Daneen Schick earlier in the day..... KJ

## 2014-07-18 ENCOUNTER — Other Ambulatory Visit: Payer: Self-pay | Admitting: Interventional Cardiology

## 2014-07-21 ENCOUNTER — Ambulatory Visit (INDEPENDENT_AMBULATORY_CARE_PROVIDER_SITE_OTHER): Payer: PPO | Admitting: Interventional Cardiology

## 2014-07-21 ENCOUNTER — Ambulatory Visit
Admission: RE | Admit: 2014-07-21 | Discharge: 2014-07-21 | Disposition: A | Discharge status: Home / Self Care | Payer: PPO | Source: Ambulatory Visit | Attending: Oncology | Admitting: Oncology

## 2014-07-21 ENCOUNTER — Encounter: Payer: Self-pay | Admitting: Interventional Cardiology

## 2014-07-21 VITALS — BP 115/58 | HR 56 | Ht 62.0 in | Wt 113.4 lb

## 2014-07-21 DIAGNOSIS — I25709 Atherosclerosis of coronary artery bypass graft(s), unspecified, with unspecified angina pectoris: Secondary | ICD-10-CM

## 2014-07-21 DIAGNOSIS — E785 Hyperlipidemia, unspecified: Secondary | ICD-10-CM

## 2014-07-21 DIAGNOSIS — I10 Essential (primary) hypertension: Secondary | ICD-10-CM

## 2014-07-21 DIAGNOSIS — C9000 Multiple myeloma not having achieved remission: Secondary | ICD-10-CM

## 2014-07-21 NOTE — Patient Instructions (Signed)
Your physician wants you to follow-up in:  12 months.  You will receive a reminder letter in the mail two months in advance. If you don't receive a letter, please call our office to schedule the follow-up appointment.   
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
## 2014-07-24 ENCOUNTER — Telehealth: Payer: Self-pay | Admitting: *Deleted

## 2014-07-24 NOTE — Telephone Encounter (Signed)
-----   Message from Ladell Pier, MD sent at 07/21/2014  8:27 PM EST ----- Please call patient, xrays look good, no evidence of myeloma

## 2014-07-24 NOTE — Telephone Encounter (Signed)
Per Dr. Benay Spice; notified pt that xrays look good, no evidence of myeloma.  Pt verbalized understanding and expressed appreciation for call.

## 2014-08-09 DIAGNOSIS — R5382 Chronic fatigue, unspecified: Secondary | ICD-10-CM | POA: Insufficient documentation

## 2014-08-23 ENCOUNTER — Other Ambulatory Visit (HOSPITAL_COMMUNITY)
Admission: RE | Admit: 2014-08-23 | Discharge: 2014-08-23 | Disposition: A | Discharge status: Home / Self Care | Payer: PPO | Source: Ambulatory Visit | Attending: Obstetrics and Gynecology | Admitting: Obstetrics and Gynecology

## 2014-08-23 ENCOUNTER — Other Ambulatory Visit: Payer: Self-pay | Admitting: Obstetrics and Gynecology

## 2014-08-23 DIAGNOSIS — Z124 Encounter for screening for malignant neoplasm of cervix: Secondary | ICD-10-CM | POA: Insufficient documentation

## 2014-08-23 DIAGNOSIS — Z1151 Encounter for screening for human papillomavirus (HPV): Secondary | ICD-10-CM | POA: Insufficient documentation

## 2014-08-23 DIAGNOSIS — R87810 Cervical high risk human papillomavirus (HPV) DNA test positive: Secondary | ICD-10-CM | POA: Insufficient documentation

## 2014-08-25 DIAGNOSIS — R7989 Other specified abnormal findings of blood chemistry: Secondary | ICD-10-CM | POA: Insufficient documentation

## 2014-08-25 LAB — CYTOLOGY - PAP

## 2014-10-19 DIAGNOSIS — M5431 Sciatica, right side: Secondary | ICD-10-CM | POA: Insufficient documentation

## 2014-11-08 ENCOUNTER — Other Ambulatory Visit: Payer: Self-pay | Admitting: Obstetrics and Gynecology

## 2015-04-10 ENCOUNTER — Encounter: Payer: Self-pay | Admitting: Nurse Practitioner

## 2015-04-10 ENCOUNTER — Ambulatory Visit (INDEPENDENT_AMBULATORY_CARE_PROVIDER_SITE_OTHER): Payer: PPO | Admitting: Nurse Practitioner

## 2015-04-10 VITALS — BP 128/58 | HR 53 | Ht 63.0 in | Wt 112.0 lb

## 2015-04-10 DIAGNOSIS — E785 Hyperlipidemia, unspecified: Secondary | ICD-10-CM

## 2015-04-10 DIAGNOSIS — I10 Essential (primary) hypertension: Secondary | ICD-10-CM

## 2015-04-10 DIAGNOSIS — I25709 Atherosclerosis of coronary artery bypass graft(s), unspecified, with unspecified angina pectoris: Secondary | ICD-10-CM

## 2015-04-10 NOTE — Patient Instructions (Addendum)
We will be checking the following labs today - NONE   Medication Instructions:    Continue with your current medicines.   Ok to stop the Crestor    Testing/Procedures To Be Arranged:  N/A  Follow-Up:   See Dr. Tamala Julian as planned    Other Special Instructions:   N/A    If you need a refill on your cardiac medications before your next appointment, please call your pharmacy.   Call the [place] office at [phone] if you have any questions, problems or concerns.

## 2015-04-10 NOTE — Progress Notes (Signed)
CARDIOLOGY OFFICE NOTE  Date:  04/10/2015    Norton Blizzard Date of Birth: 1932/01/09 Medical Record #[id number]  PCP:  Lucille Passy, MD  Cardiologist:  Tamala Julian    Chief Complaint  Patient presents with  . Coronary Artery Disease    Follow up visit - seen for Dr. Tamala Julian    History of Present Illness: [name] is a 81 y.o. female who presents today for a follow up visit. Seen for Dr. Tamala Julian. She has known CAD with prior CABG, chronic angina, abnormal Myoview from 2014, HTN, GERD, Raynauds, multiple myeloma,  and spinal stenosis. Other issues as noted below.   Seen back in February - stable angina.   Comes back today. Here alone. She tells me that she has had terrible muscle pain - all over - her muscles are tight according to her massage therapist - she is needing to have deep tissue massage. She was in [facility] back in August with chest pain - had a stress test - was told it was ok - did not bring [name] her records - she left them at home. She notes that she tends to exaggerate. She did stop her Crestor on October 9th and is no better. Labs are checked by PCP. She is now at [facility] in [place]. Chest pain seems better. Does use NTG on occasion. Asking about other options "without side effects". Lots of Gi issues.   Past Medical History  Diagnosis Date  . Hx of CABG   . Hypertension   . GERD (gastroesophageal reflux disease)   . Anxiety   . Coronary artery disease   . Arthritis   . Anginal pain ([place])   . Shortness of breath   . Intermediate coronary syndrome ([place])   . Raynauds syndrome   . Coronary atherosclerosis of unspecified type of vessel, native or graft   . Coronary atherosclerosis of autologous vein bypass graft   . Spinal stenosis of lumbar region   . Hypercholesteremia   . Hyperlipidemia     GOAL LDL <70  . Panic disorder   . Osteopenia   . Multiple myeloma ([facility])     Dr. Benay Spice  . Mild chronic anemia   . Urine test positive for microalbuminuria  12/2011  . Ovarian cyst, bilateral     Rt complex  . Allergic rhinitis   . Atypical chest pain 04/25/13    Doing better, does occasionally respond to Nitroglycerin  . Overactive bladder     Past Surgical History  Procedure Laterality Date  . Tonsillectomy and adenoidectomy    . Dilation and curettage of uterus    . Eye surgery      both cataracts  . Colonoscopy    . Upper gastrointestinal endoscopy    . Capsulotomy Right 08/04/2012    Procedure: CAPSULOTOMY;  Surgeon: Colin Rhein, MD;  Location: [place];  Service: Orthopedics;  Laterality: Right;  RIGHT 2ND TOE METATARSOPHALANGEAL JOINT DORSAL CAPSULOTOMY   . Bunionectomy with hammertoe reconstruction Right 08/04/2012    Procedure:  HAMMERTOE RECONSTRUCTION;  Surgeon: Colin Rhein, MD;  Location: [facility];  Service: Orthopedics;  Laterality: Right;  HAMMER TOE RECONSTRUCTION PROBABLE FLEXOR DIGITORUM LONGUS TO PROXIMAL PHALANX TRANSFER LATERALIZED   . Coronary artery bypass graft  2007    LIMA to LAD, SVG to RCA  non-ST segment MI 08/2009 (Dr. Tamala Julian)  . Hammer toe surgery Right 08/2012  . Cataract extraction, bilateral    .  Coronary angioplasty with stent placement  2014    2 stents  . Left heart catheterization with coronary/graft angiogram N/A 07/09/2012    Procedure: LEFT HEART CATHETERIZATION WITH Beatrix Fetters;  Surgeon: Sinclair Grooms, MD;  Location: [facility] CATH LAB;  Service: Cardiovascular;  Laterality: N/A;     Medications: Current Outpatient Prescriptions  Medication Sig Dispense Refill  . acetaminophen (TYLENOL) 500 MG tablet Take 1 tablet (500 mg total) by mouth every 6 (six) hours as needed. 30 tablet 0  . aspirin 325 MG tablet Take 325 mg by mouth daily.    . Biotin 5000 MCG CAPS Take 5,000 mcg by mouth every evening.     . calcium carbonate (OS-CAL) 600 MG TABS tablet Take 600 mg by mouth daily with breakfast.    . co-enzyme Q-10 30 MG capsule Take 100 mg by mouth  daily.    . fluticasone (FLONASE) 50 MCG/ACT nasal spray Place 2 sprays into the nose daily as needed. For allergies    . isosorbide mononitrate (IMDUR) 60 MG 24 hr tablet Take 1 tablet (60 mg total) by mouth daily. 30 tablet 0  . LORazepam (ATIVAN) 0.5 MG tablet Take 0.5 mg by mouth at bedtime.     . metoprolol tartrate (LOPRESSOR) 25 MG tablet Take 25 mg by mouth 2 (two) times daily.    . mirabegron ER (MYRBETRIQ) 50 MG TB24 tablet Take 25 mg by mouth daily.    . montelukast (SINGULAIR) 10 MG tablet Take 10 mg by mouth 2 (two) times daily.    . Multiple Vitamin (MULITIVITAMIN WITH MINERALS) TABS Take 1 tablet by mouth daily.    . nitroGLYCERIN (NITROSTAT) 0.4 MG SL tablet Place 0.4 mg under the tongue every 5 (five) minutes as needed for chest pain.    . NON FORMULARY Take 30 mLs by mouth daily. Braggs Apple Cider Vinegar in 6 oz water (mixed with honey)    . Polyethyl Glycol-Propyl Glycol (SYSTANE OP) Apply 1 drop to eye daily as needed (for dry eyes).    . polyethylene glycol (MIRALAX / GLYCOLAX) packet Take 17 g by mouth at bedtime.    Marland Kitchen SALINE NASAL SPRAY NA Place 3-4 sprays into the nose 2 (two) times daily as needed (for dryness).     . sertraline (ZOLOFT) 25 MG tablet Take 50 mg by mouth daily.     Marland Kitchen UNABLE TO FIND Inject into the skin once a week. Med Name: [facility] per [facility] Q 3 weeks to right ankle (lasts 30 minutes)    . rosuvastatin (CRESTOR) 40 MG tablet Take 20 mg by mouth daily.     No current facility-administered medications for this visit.    Allergies: Allergies  Allergen Reactions  . Cephalosporins Other (See Comments)    unknown  . Codeine Nausea Only  . Levaquin [Levofloxacin] Shortness Of Breath  . Macrodantin Other (See Comments)    Double vision  . Morphine And Related Other (See Comments)    hallucinations  . Trimethoprim Nausea Only  . Cilostazol Other (See Comments)    Stomach upset  . Dexilant [Dexlansoprazole] Diarrhea  . Erythromycin  Nausea And Vomiting    All "mycin" drugs  . Simvastatin Other (See Comments)    Muscle aches  . Sulfa Antibiotics Other (See Comments)    unknown  . Vesicare [Solifenacin] Other (See Comments)    unknown  . Butazolidin [Phenylbutazone] Swelling and Rash  . Food Other (See Comments)    No spicy foods or black  pepper, raw onions - causes gerd  . Ibuprofen Hives and Rash  . Penicillins Rash  . Toviaz [Fesoterodine Fumarate Er] Other (See Comments)    unknown  . Voltaren [Diclofenac Sodium] Rash    Social History: The patient  reports that she has never smoked. She has never used smokeless tobacco. She reports that she drinks alcohol. She reports that she does not use illicit drugs.   Family History: The patient's family history includes CAD in her mother; Cancer in her paternal aunt; Heart attack in her mother; Obesity in her sister.   Review of Systems: Please see the history of present illness.   Otherwise, the review of systems is positive for none.   All other systems are reviewed and negative.   Physical Exam: VS:  BP 128/58 mmHg  Pulse 53  Ht '5\' 3"'  (1.6 m)  Wt 112 lb (50.803 kg)  BMI 19.84 kg/m2  SpO2 96% .  BMI Body mass index is 19.84 kg/(m^2).  Wt Readings from Last 3 Encounters:  04/10/15 112 lb (50.803 kg)  07/21/14 113 lb 6.4 oz (51.438 kg)  07/06/14 114 lb 8 oz (51.937 kg)    General: Chronically ill but in no acute distress. Color is sallow. Seems anxious.  HEENT: Normal. Neck: Supple, no JVD, carotid bruits, or masses noted.  Cardiac: Regular rate and rhythm. No murmurs, rubs, or gallops. No edema.  Respiratory:  Lungs are clear to auscultation bilaterally with normal work of breathing.  GI: Soft and nontender.  MS: No deformity or atrophy. Gait and ROM intact. Skin: Warm and dry. Color is normal.  Neuro:  Strength and sensation are intact and no gross focal deficits noted.  Psych: Alert, appropriate and with normal affect.   LABORATORY DATA:  EKG:   EKG is not ordered today.  Lab Results  Component Value Date   WBC 3.7* 10/16/2013   HGB 10.6* 10/16/2013   HCT 32.2* 10/16/2013   PLT 225 10/16/2013   GLUCOSE 90 10/16/2013   CHOL 128 07/09/2012   TRIG 22 07/09/2012   HDL 73 07/09/2012   LDLCALC 51 07/09/2012   ALT 17 09/20/2013   AST 35* 09/20/2013   NA 138 10/16/2013   K 4.0 10/16/2013   CL 99 10/16/2013   CREATININE 0.80 10/16/2013   BUN 26* 10/16/2013   CO2 28 10/16/2013   TSH 5.901 Test methodology is 3rd generation TSH 05/17/2007   INR 1.00 01/12/2013   HGBA1C 6.1* 07/08/2012    BNP (last 3 results) No results for input(s): BNP in the last 8760 hours.  ProBNP (last 3 results) No results for input(s): PROBNP in the last 8760 hours.   Other Studies Reviewed Today:   Assessment/Plan: 1. CAD with prior CABG and stable angina. Says she has had recent stress testing at [facility] and was told it was ok. She is planning on going to bring these records for Dr. Tamala Julian to review.   2. Hypertension with control.  3. Hyperlipidemia - now off of statin - has no desire whatsoever to restart. Would like to hold on alternative treatment. Labs checked by PCP. I have told her that is is ok to stop.   4. Syncope/sleeping while driving. License was taken.  Current medicines are reviewed with the patient today.  The patient does not have concerns regarding medicines other than what has been noted above.  The following changes have been made:  See above.  Labs/ tests ordered today include:   No orders of the defined  types were placed in this encounter.     Disposition:   FU with Dr. Tamala Julian as planned.   Patient is agreeable to this plan and will call if any problems develop in the interim.   Signed: Burtis Junes, RN, ANP-C 04/10/2015 11:03 AM  [facility] [address] [place], [place]  [address] Phone: [phone] Fax: [phone]

## 2015-05-17 ENCOUNTER — Ambulatory Visit: Payer: Self-pay | Admitting: Licensed Clinical Social Worker

## 2015-05-22 ENCOUNTER — Telehealth: Payer: Self-pay | Admitting: *Deleted

## 2015-05-22 NOTE — Telephone Encounter (Signed)
Message from pt reporting she has been seeing a rheumatologist in [facility] for pain. They have not been able to identify cause of pain. Asking to be seen sooner than current appointment. Returned call, pt reports she also saw an orthopedist who thought the pain may have been related to rotator cuff injury but she is having pain all over.  Pt has taken a steroid taper with no relief. Rheumatologist recommended referral to neurology but instructed pt to call Dr. Benay Spice when she told her she had "smoldering myeloma." Reviewed with Dr. Benay Spice: Will move office visit to early Jan. Pt in agreement. She has labs done at [facility], was given a written order at last visit. Will call to request they run labs late Dec for visit.

## 2015-05-25 NOTE — Telephone Encounter (Signed)
Left message on voicemail at the clinic for [facility] [phone]) requesting they draw pt's labs 12/27 or 12/28. Pt's office visit will be moved to early Jan.

## 2015-05-29 NOTE — Telephone Encounter (Signed)
[name] called reporting she "is having unusual symptoms, lives at [facility].  Someone called there requesting labs are to be drawn early am on 06-06-2015 but the clinic needs a faxed order from Dr. Benay Spice today with what labs need to be drawn.  The fax number is [phone]."

## 2015-05-30 NOTE — Telephone Encounter (Signed)
Per Dr. Benay Spice; faxed order for labs (CBC, CMET, SPEP) to be drawn 12/27 or 12/28 to [facility]

## 2015-06-02 ENCOUNTER — Emergency Department (HOSPITAL_COMMUNITY)
Admission: EM | Admit: 2015-06-02 | Discharge: 2015-06-02 | Discharge status: Against Medical Advice | Payer: PPO | Attending: Emergency Medicine | Admitting: Emergency Medicine

## 2015-06-02 ENCOUNTER — Encounter (HOSPITAL_COMMUNITY): Payer: Self-pay | Admitting: Emergency Medicine

## 2015-06-02 ENCOUNTER — Emergency Department (HOSPITAL_COMMUNITY): Payer: PPO

## 2015-06-02 DIAGNOSIS — I10 Essential (primary) hypertension: Secondary | ICD-10-CM | POA: Insufficient documentation

## 2015-06-02 DIAGNOSIS — R079 Chest pain, unspecified: Secondary | ICD-10-CM | POA: Insufficient documentation

## 2015-06-02 DIAGNOSIS — I25119 Atherosclerotic heart disease of native coronary artery with unspecified angina pectoris: Secondary | ICD-10-CM | POA: Diagnosis not present

## 2015-06-02 LAB — CBC
HCT: 34.7 % — ABNORMAL LOW (ref 36.0–46.0)
Hemoglobin: 11.4 g/dL — ABNORMAL LOW (ref 12.0–15.0)
MCH: 31.7 pg (ref 26.0–34.0)
MCHC: 32.9 g/dL (ref 30.0–36.0)
MCV: 96.4 fL (ref 78.0–100.0)
PLATELETS: 243 10*3/uL (ref 150–400)
RBC: 3.6 MIL/uL — AB (ref 3.87–5.11)
RDW: 15.2 % (ref 11.5–15.5)
WBC: 4.8 10*3/uL (ref 4.0–10.5)

## 2015-06-02 LAB — BASIC METABOLIC PANEL
Anion gap: 11 (ref 5–15)
BUN: 18 mg/dL (ref 6–20)
CO2: 27 mmol/L (ref 22–32)
CREATININE: 1.03 mg/dL — AB (ref 0.44–1.00)
Calcium: 10 mg/dL (ref 8.9–10.3)
Chloride: 97 mmol/L — ABNORMAL LOW (ref 101–111)
GFR, EST AFRICAN AMERICAN: 57 mL/min — AB (ref >60)
GFR, EST NON AFRICAN AMERICAN: 50 mL/min — AB (ref >60)
Glucose, Bld: 128 mg/dL — ABNORMAL HIGH (ref 65–99)
POTASSIUM: 4.2 mmol/L (ref 3.5–5.1)
SODIUM: 135 mmol/L (ref 135–145)

## 2015-06-02 LAB — I-STAT TROPONIN, ED: Troponin i, poc: 0 ng/mL (ref 0.00–0.08)

## 2015-06-02 NOTE — ED Notes (Signed)
Pt, pts daughter and granddaughter came to nurse first to report they are leaving.  Pt is not c/o of any pain at this time.  This nurse advised pt to return to ED if symptoms return or other concerns.

## 2015-06-02 NOTE — ED Notes (Signed)
Pt c/o left sided chest pain x 2 days. Pt tried gavastatin because she thought it was indigestion today without relief. Pt reports after felt like her heart was flipping and having palpitations.

## 2015-06-05 ENCOUNTER — Telehealth: Payer: Self-pay | Admitting: Interventional Cardiology

## 2015-06-05 NOTE — Telephone Encounter (Signed)
Follow Up ° ° ° ° °Pt is returning call from earlier. Please call. °

## 2015-06-05 NOTE — Telephone Encounter (Signed)
Informed patient that Dr. Tamala Julian looked over her ED records and is satisfied with what was completed. Instructed patient to decrease physical activity until she begins to feel better, and to use NTG for pain relief if it works. Patient understands to call with any other concerns or if new symptoms arise.

## 2015-06-05 NOTE — Telephone Encounter (Signed)
Patient called c/o CP for 4 days. She states she had to leave Christmas Eve service due to L sided CP. She went to the ED 12/24. Troponin and chest x ray were negative, but per ED notes,  Alma Downs, RN (Registered Nurse) 06/02/2015 23:02    Expand All Collapse All   Pt, pts daughter and granddaughter came to nurse first to report they are leaving. Pt is not c/o of any pain at this time. This nurse advised pt to return to ED if symptoms return or other concerns.        The patient st the CP "comes and goes." It happened while she was running errands yesterday and again this AM.  The patient reports she is very stressed this time of year anyway, but aside from that was recently diagnosed with polymyalgia rheumatica, which makes "everything much more stressful."  She reports "major sinus issues and a runny nose" for months.  Ms. Prisbrey reports difficulty falling asleep last night secondary to mild SOB.  Ms. Norred is currently asymptomatic, but audibly upset that she "didn't even see Dr. Tamala Julian while she was in the hospital."  She became more upset when she asked to rescheduled her January 27 appointment to ASAP and was informed Dr. Tamala Julian is not in the office this week. She refused to see an APP.  Patient anxiously awaits Dr. Thompson Caul recommendations.

## 2015-06-05 NOTE — Telephone Encounter (Signed)
Let her know that I have reviewed her symptoms and the emergency room data. I do not believe there is much more that needs to be done. She should decrease physical activity until she begins to feel better. It is okay to use nitroglycerin as required for pain relief, if it works. If there are other concerns, please let me know.

## 2015-06-05 NOTE — Telephone Encounter (Signed)
Attempted to call patient. No answer after several rings and no VM set up.  Will try again later.

## 2015-06-05 NOTE — Telephone Encounter (Signed)
Pt c/o of Chest Pain: STAT if CP now or developed within 24 hours  1. Are you having CP right now? No  2. Are you experiencing any other symptoms (ex. SOB, nausea, vomiting, sweating)? Sob last night  3. How long have you been experiencing CP? 3 days ago  4. Is your CP continuous or coming and going? steady  5. Have you taken Nitroglycerin? No  ?

## 2015-06-07 ENCOUNTER — Ambulatory Visit (INDEPENDENT_AMBULATORY_CARE_PROVIDER_SITE_OTHER): Payer: PPO | Admitting: Licensed Clinical Social Worker

## 2015-06-07 DIAGNOSIS — F321 Major depressive disorder, single episode, moderate: Secondary | ICD-10-CM

## 2015-06-13 ENCOUNTER — Encounter (HOSPITAL_COMMUNITY): Payer: Self-pay

## 2015-06-13 ENCOUNTER — Emergency Department (HOSPITAL_COMMUNITY): Payer: PPO

## 2015-06-13 ENCOUNTER — Inpatient Hospital Stay (HOSPITAL_COMMUNITY)
Admission: EM | Admit: 2015-06-13 | Discharge: 2015-06-14 | DRG: 313 | Disposition: A | Discharge status: Home / Self Care | Payer: PPO | Attending: Interventional Cardiology | Admitting: Interventional Cardiology

## 2015-06-13 DIAGNOSIS — I7300 Raynaud's syndrome without gangrene: Secondary | ICD-10-CM | POA: Diagnosis not present

## 2015-06-13 DIAGNOSIS — R079 Chest pain, unspecified: Secondary | ICD-10-CM | POA: Diagnosis not present

## 2015-06-13 DIAGNOSIS — R0789 Other chest pain: Principal | ICD-10-CM | POA: Diagnosis present

## 2015-06-13 DIAGNOSIS — Z880 Allergy status to penicillin: Secondary | ICD-10-CM | POA: Diagnosis not present

## 2015-06-13 DIAGNOSIS — Z79899 Other long term (current) drug therapy: Secondary | ICD-10-CM | POA: Diagnosis not present

## 2015-06-13 DIAGNOSIS — Z888 Allergy status to other drugs, medicaments and biological substances status: Secondary | ICD-10-CM

## 2015-06-13 DIAGNOSIS — Z886 Allergy status to analgesic agent status: Secondary | ICD-10-CM

## 2015-06-13 DIAGNOSIS — E785 Hyperlipidemia, unspecified: Secondary | ICD-10-CM | POA: Diagnosis not present

## 2015-06-13 DIAGNOSIS — I2510 Atherosclerotic heart disease of native coronary artery without angina pectoris: Secondary | ICD-10-CM | POA: Diagnosis present

## 2015-06-13 DIAGNOSIS — F329 Major depressive disorder, single episode, unspecified: Secondary | ICD-10-CM | POA: Diagnosis not present

## 2015-06-13 DIAGNOSIS — Z951 Presence of aortocoronary bypass graft: Secondary | ICD-10-CM | POA: Diagnosis not present

## 2015-06-13 DIAGNOSIS — K219 Gastro-esophageal reflux disease without esophagitis: Secondary | ICD-10-CM | POA: Diagnosis present

## 2015-06-13 DIAGNOSIS — Z7982 Long term (current) use of aspirin: Secondary | ICD-10-CM | POA: Diagnosis not present

## 2015-06-13 DIAGNOSIS — I25119 Atherosclerotic heart disease of native coronary artery with unspecified angina pectoris: Secondary | ICD-10-CM | POA: Diagnosis not present

## 2015-06-13 DIAGNOSIS — F32A Depression, unspecified: Secondary | ICD-10-CM | POA: Diagnosis present

## 2015-06-13 DIAGNOSIS — I25810 Atherosclerosis of coronary artery bypass graft(s) without angina pectoris: Secondary | ICD-10-CM | POA: Diagnosis present

## 2015-06-13 DIAGNOSIS — Z882 Allergy status to sulfonamides status: Secondary | ICD-10-CM | POA: Diagnosis not present

## 2015-06-13 DIAGNOSIS — Z885 Allergy status to narcotic agent status: Secondary | ICD-10-CM

## 2015-06-13 DIAGNOSIS — C9000 Multiple myeloma not having achieved remission: Secondary | ICD-10-CM | POA: Diagnosis present

## 2015-06-13 DIAGNOSIS — R0602 Shortness of breath: Secondary | ICD-10-CM | POA: Diagnosis not present

## 2015-06-13 DIAGNOSIS — I10 Essential (primary) hypertension: Secondary | ICD-10-CM | POA: Diagnosis present

## 2015-06-13 HISTORY — DX: Unspecified urinary incontinence: R32

## 2015-06-13 LAB — TROPONIN I: Troponin I: <0.03 ng/mL (ref <0.031)

## 2015-06-13 LAB — CBC
HEMATOCRIT: 33.1 % — AB (ref 36.0–46.0)
HEMATOCRIT: 32 % — AB (ref 36.0–46.0)
HEMOGLOBIN: 10.5 g/dL — AB (ref 12.0–15.0)
Hemoglobin: 11 g/dL — ABNORMAL LOW (ref 12.0–15.0)
MCH: 31.6 pg (ref 26.0–34.0)
MCH: 31.3 pg (ref 26.0–34.0)
MCHC: 33.2 g/dL (ref 30.0–36.0)
MCHC: 32.8 g/dL (ref 30.0–36.0)
MCV: 95.1 fL (ref 78.0–100.0)
MCV: 95.2 fL (ref 78.0–100.0)
PLATELETS: 250 10*3/uL (ref 150–400)
Platelets: 262 10*3/uL (ref 150–400)
RBC: 3.48 MIL/uL — ABNORMAL LOW (ref 3.87–5.11)
RBC: 3.36 MIL/uL — ABNORMAL LOW (ref 3.87–5.11)
RDW: 14.9 % (ref 11.5–15.5)
RDW: 15 % (ref 11.5–15.5)
WBC: 4.7 10*3/uL (ref 4.0–10.5)
WBC: 4.3 10*3/uL (ref 4.0–10.5)

## 2015-06-13 LAB — HEPATIC FUNCTION PANEL
ALT: 16 U/L (ref 14–54)
AST: 27 U/L (ref 15–41)
Albumin: 3 g/dL — ABNORMAL LOW (ref 3.5–5.0)
Alkaline Phosphatase: 32 U/L — ABNORMAL LOW (ref 38–126)
BILIRUBIN DIRECT: 0.1 mg/dL (ref 0.1–0.5)
BILIRUBIN INDIRECT: 0.4 mg/dL (ref 0.3–0.9)
BILIRUBIN TOTAL: 0.5 mg/dL (ref 0.3–1.2)
Total Protein: 6.8 g/dL (ref 6.5–8.1)

## 2015-06-13 LAB — BASIC METABOLIC PANEL
Anion gap: 8 (ref 5–15)
BUN: 16 mg/dL (ref 6–20)
CALCIUM: 9 mg/dL (ref 8.9–10.3)
CO2: 30 mmol/L (ref 22–32)
CREATININE: 0.78 mg/dL (ref 0.44–1.00)
Chloride: 98 mmol/L — ABNORMAL LOW (ref 101–111)
GLUCOSE: 93 mg/dL (ref 65–99)
Potassium: 3.6 mmol/L (ref 3.5–5.1)
Sodium: 136 mmol/L (ref 135–145)

## 2015-06-13 LAB — I-STAT TROPONIN, ED: Troponin i, poc: 0 ng/mL (ref 0.00–0.08)

## 2015-06-13 LAB — CREATININE, SERUM
Creatinine, Ser: 0.79 mg/dL (ref 0.44–1.00)
GFR calc Af Amer: >60 mL/min (ref >60)

## 2015-06-13 MED ORDER — MIRABEGRON ER 25 MG PO TB24
25.0000 mg | ORAL_TABLET | Freq: Every day | ORAL | Status: DC
  Administered 2015-06-13 – 2015-06-14 (×2): 25 mg via ORAL
  Filled 2015-06-13 (×2): qty 1

## 2015-06-13 MED ORDER — ROSUVASTATIN CALCIUM 10 MG PO TABS
5.0000 mg | ORAL_TABLET | Freq: Every day | ORAL | Status: DC
  Administered 2015-06-13: 5 mg via ORAL
  Filled 2015-06-13: qty 1

## 2015-06-13 MED ORDER — NITROGLYCERIN 0.4 MG SL SUBL: 0.4000 mg | SUBLINGUAL_TABLET | SUBLINGUAL | Status: DC | PRN

## 2015-06-13 MED ORDER — MONTELUKAST SODIUM 10 MG PO TABS
10.0000 mg | ORAL_TABLET | Freq: Every day | ORAL | Status: DC
  Administered 2015-06-13: 10 mg via ORAL
  Filled 2015-06-13: qty 1

## 2015-06-13 MED ORDER — SERTRALINE HCL 50 MG PO TABS
75.0000 mg | ORAL_TABLET | Freq: Every day | ORAL | Status: DC
  Administered 2015-06-13: 75 mg via ORAL
  Filled 2015-06-13: qty 2

## 2015-06-13 MED ORDER — HEPARIN SODIUM (PORCINE) 5000 UNIT/ML IJ SOLN
5000.0000 [IU] | Freq: Three times a day (TID) | INTRAMUSCULAR | Status: DC
  Administered 2015-06-13 – 2015-06-14 (×3): 5000 [IU] via SUBCUTANEOUS
  Filled 2015-06-13 (×3): qty 1

## 2015-06-13 MED ORDER — METOPROLOL TARTRATE 25 MG PO TABS
25.0000 mg | ORAL_TABLET | Freq: Two times a day (BID) | ORAL | Status: DC
  Administered 2015-06-13 – 2015-06-14 (×2): 25 mg via ORAL
  Filled 2015-06-13 (×4): qty 1

## 2015-06-13 MED ORDER — ISOSORBIDE MONONITRATE ER 60 MG PO TB24
60.0000 mg | ORAL_TABLET | Freq: Every day | ORAL | Status: DC
  Administered 2015-06-14: 60 mg via ORAL
  Filled 2015-06-13 (×2): qty 1

## 2015-06-13 MED ORDER — DONEPEZIL HCL 5 MG PO TABS
5.0000 mg | ORAL_TABLET | Freq: Every day | ORAL | Status: DC
  Administered 2015-06-13: 5 mg via ORAL
  Filled 2015-06-13: qty 1

## 2015-06-13 MED ORDER — MORPHINE SULFATE (PF) 2 MG/ML IV SOLN
2.0000 mg | Freq: Once | INTRAVENOUS | Status: DC
  Filled 2015-06-13: qty 1

## 2015-06-13 MED ORDER — NITROGLYCERIN 0.4 MG SL SUBL
0.4000 mg | SUBLINGUAL_TABLET | Freq: Once | SUBLINGUAL | Status: DC
  Filled 2015-06-13: qty 1

## 2015-06-13 MED ORDER — ASPIRIN 325 MG PO TABS
325.0000 mg | ORAL_TABLET | Freq: Every day | ORAL | Status: DC
  Administered 2015-06-14: 325 mg via ORAL
  Filled 2015-06-13: qty 1

## 2015-06-13 MED ORDER — ACETAMINOPHEN 500 MG PO TABS
500.0000 mg | ORAL_TABLET | Freq: Four times a day (QID) | ORAL | Status: DC | PRN
  Administered 2015-06-14: 500 mg via ORAL
  Filled 2015-06-13: qty 1

## 2015-06-13 MED ORDER — CALCIUM CARBONATE 1250 (500 CA) MG PO TABS
600.0000 mg | ORAL_TABLET | Freq: Every day | ORAL | Status: DC
  Administered 2015-06-14: 625 mg via ORAL
  Filled 2015-06-13: qty 1

## 2015-06-13 MED ORDER — POLYETHYLENE GLYCOL 3350 17 G PO PACK
17.0000 g | PACK | Freq: Every day | ORAL | Status: DC
  Administered 2015-06-13: 17 g via ORAL
  Filled 2015-06-13: qty 1

## 2015-06-13 MED ORDER — LORAZEPAM 0.5 MG PO TABS
0.2500 mg | ORAL_TABLET | Freq: Every day | ORAL | Status: DC
  Administered 2015-06-13: 0.5 mg via ORAL
  Filled 2015-06-13: qty 1

## 2015-06-13 NOTE — ED Notes (Addendum)
Per EMS - pt from home. Sudden onset sharp CP and left breast pain around 0430. Pt cannot remember if she had a bowel movement before or after CP started. No CP at this time. Pt initially hypertensive (180/82) - last BP 155/61. Pt takes beta blocker - hr around 60bpm. Pt hx open heart surgery. Pt has had symptoms similar to these but not as bad as today.   Pt took 3 nitro and 2-325mg  aspirin at home.

## 2015-06-13 NOTE — ED Notes (Signed)
Attempted report 

## 2015-06-13 NOTE — ED Provider Notes (Signed)
CSN: [id number]     Arrival date & time 06/13/15  1062 History   First MD Initiated Contact with Patient 06/13/15 [phone]     Chief Complaint  Patient presents with  . Chest Pain  . Breast Pain     (Consider location/radiation/quality/duration/timing/severity/associated sxs/prior Treatment) HPI Comments: 82 y.o. Female with history of CAD s/p CABG, hyperlipidemia presents for chest pain.  The patient reports that early this morning she either was awoken from sleep by sharp chest pain or awoke to use the bathroom and just noticed the pain but either way she had sharp severe pain in her left chest.  She said that this was somewhat similar to the pain she believes that she had before she needed heart surgery but reports that it was much more severe than any pain she has had since that time.  She states that she has had lots of episodes of pain since her surgery but this felt more like her heart.  She took 3 Nitro and 2x 325 mg ASA at home and currently is asymptomatic.  She denies radiation of the pain, shortness of breath, diaphoresis. She does note however that on Christmas eve she had an episode of chest pain that resolved while waiting to be seen and that she did not stay for work up in the ED but that since then has felt fatigued and intermittently short of breath with her daily activities.  Patient is a 82 y.o. female presenting with chest pain.  Chest Pain Associated symptoms: no abdominal pain, no back pain, no cough, no dizziness, no fatigue, no fever, no headache, no nausea, no palpitations, no shortness of breath, not vomiting and no weakness     Past Medical History  Diagnosis Date  . Hx of CABG   . Hypertension   . GERD (gastroesophageal reflux disease)   . Anxiety   . Coronary artery disease   . Arthritis   . Anginal pain ([place])   . Shortness of breath   . Intermediate coronary syndrome ([place])   . Raynauds syndrome   . Coronary atherosclerosis of unspecified type of vessel, native  or graft   . Coronary atherosclerosis of autologous vein bypass graft   . Spinal stenosis of lumbar region   . Hypercholesteremia   . Hyperlipidemia     GOAL LDL <70  . Panic disorder   . Osteopenia   . Multiple myeloma ([facility])     Dr. Benay Spice  . Mild chronic anemia   . Urine test positive for microalbuminuria 12/2011  . Ovarian cyst, bilateral     Rt complex  . Allergic rhinitis   . Atypical chest pain 04/25/13    Doing better, does occasionally respond to Nitroglycerin  . Overactive bladder    Past Surgical History  Procedure Laterality Date  . Tonsillectomy and adenoidectomy    . Dilation and curettage of uterus    . Eye surgery      both cataracts  . Colonoscopy    . Upper gastrointestinal endoscopy    . Capsulotomy Right 08/04/2012    Procedure: CAPSULOTOMY;  Surgeon: Colin Rhein, MD;  Location: [facility];  Service: Orthopedics;  Laterality: Right;  RIGHT 2ND TOE METATARSOPHALANGEAL JOINT DORSAL CAPSULOTOMY   . Bunionectomy with hammertoe reconstruction Right 08/04/2012    Procedure:  HAMMERTOE RECONSTRUCTION;  Surgeon: Colin Rhein, MD;  Location: [facility];  Service: Orthopedics;  Laterality: Right;  HAMMER TOE RECONSTRUCTION PROBABLE FLEXOR DIGITORUM LONGUS TO PROXIMAL PHALANX  TRANSFER LATERALIZED   . Coronary artery bypass graft  2007    LIMA to LAD, SVG to RCA  non-ST segment MI 08/2009 (Dr. Tamala Julian)  . Hammer toe surgery Right 08/2012  . Cataract extraction, bilateral    . Coronary angioplasty with stent placement  2014    2 stents  . Left heart catheterization with coronary/graft angiogram N/A 07/09/2012    Procedure: LEFT HEART CATHETERIZATION WITH Beatrix Fetters;  Surgeon: Sinclair Grooms, MD;  Location: [facility] CATH LAB;  Service: Cardiovascular;  Laterality: N/A;   Family History  Problem Relation Age of Onset  . Heart attack Mother   . CAD Mother   . Obesity Sister   . Cancer Paternal Aunt     breast   Social History   Substance Use Topics  . Smoking status: Never Smoker   . Smokeless tobacco: Never Used  . Alcohol Use: Yes     Comment: occasional   OB History    No data available     Review of Systems  Constitutional: Negative for fever, chills, appetite change and fatigue.  HENT: Negative for congestion, postnasal drip and rhinorrhea.   Eyes: Negative for pain and visual disturbance.  Respiratory: Negative for cough, chest tightness and shortness of breath.   Cardiovascular: Positive for chest pain. Negative for palpitations and leg swelling.  Gastrointestinal: Negative for nausea, vomiting, abdominal pain and diarrhea.  Genitourinary: Negative for dysuria, urgency, hematuria and decreased urine volume.  Musculoskeletal: Negative for myalgias and back pain.  Skin: Negative for rash.  Neurological: Negative for dizziness, weakness and headaches.  Hematological: Does not bruise/bleed easily.      Allergies  Cephalosporins; Codeine; Levaquin; Macrodantin; Morphine and related; Trimethoprim; Cilostazol; Dexilant; Erythromycin; Simvastatin; Sulfa antibiotics; Vesicare; Butazolidin; Food; Ibuprofen; Penicillins; Toviaz; and Voltaren  Home Medications   Prior to Admission medications   Medication Sig Start Date End Date Taking? Authorizing Provider  acetaminophen (TYLENOL) 500 MG tablet Take 1 tablet (500 mg total) by mouth every 6 (six) hours as needed. Patient taking differently: Take 1,000 mg by mouth every 6 (six) hours as needed for headache.  06/15/14  Yes Charlesetta Shanks, MD  aspirin 325 MG tablet Take 325 mg by mouth daily.   Yes Historical Provider, MD  Biotin 5000 MCG CAPS Take 5,000 mcg by mouth every evening.    Yes Historical Provider, MD  calcium carbonate (OS-CAL) 600 MG TABS tablet Take 600 mg by mouth daily with breakfast.   Yes Historical Provider, MD  donepezil (ARICEPT) 5 MG tablet Take 5 mg by mouth at bedtime. 05/25/15 05/24/16 Yes Historical Provider, MD  Hypertonic Nasal  Wash (SINUS RINSE NA) Place 1 application into the nose daily.   Yes Historical Provider, MD  isosorbide mononitrate (IMDUR) 60 MG 24 hr tablet Take 1 tablet (60 mg total) by mouth daily. 01/14/13  Yes Delfina Redwood, MD  LORazepam (ATIVAN) 0.5 MG tablet Take 0.25-0.5 mg by mouth at bedtime.    Yes Historical Provider, MD  metoprolol tartrate (LOPRESSOR) 25 MG tablet Take 25 mg by mouth 2 (two) times daily.   Yes Historical Provider, MD  mirabegron ER (MYRBETRIQ) 50 MG TB24 tablet Take 25 mg by mouth daily.   Yes Historical Provider, MD  montelukast (SINGULAIR) 10 MG tablet Take 10 mg by mouth at bedtime.    Yes Historical Provider, MD  Multiple Vitamin (MULITIVITAMIN WITH MINERALS) TABS Take 1 tablet by mouth daily.   Yes Historical Provider, MD  nitroGLYCERIN (NITROSTAT) 0.4 MG  SL tablet Place 0.4 mg under the tongue every 5 (five) minutes as needed for chest pain.   Yes Historical Provider, MD  NON FORMULARY Take 30 mLs by mouth daily. [place] Apple Cider Vinegar in 6 oz water (mixed with honey)   Yes Historical Provider, MD  Polyethyl Glycol-Propyl Glycol (SYSTANE OP) Apply 1 drop to eye daily as needed (for dry eyes).   Yes Historical Provider, MD  polyethylene glycol (MIRALAX / GLYCOLAX) packet Take 17 g by mouth at bedtime.   Yes Historical Provider, MD  predniSONE (DELTASONE) 5 MG tablet Take 10 mg by mouth daily with breakfast.    Yes Historical Provider, MD  sertraline (ZOLOFT) 25 MG tablet Take 75 mg by mouth at bedtime.    Yes Historical Provider, MD  UNABLE TO FIND Inject into the skin once a week. Med Name: [facility] weeks to right ankle (lasts 30 minutes)   Yes Historical Provider, MD   BP 124/55 mmHg  Pulse 53  Temp(Src) 97.6 F (36.4 C) (Oral)  Resp 17  SpO2 94% Physical Exam  Constitutional: She is oriented to person, place, and time. She appears well-developed and well-nourished. No distress.  HENT:  Head: Normocephalic and atraumatic.  Right Ear:  External ear normal.  Left Ear: External ear normal.  Nose: Nose normal.  Mouth/Throat: Oropharynx is clear and moist. No oropharyngeal exudate.  Eyes: EOM are normal. Pupils are equal, round, and reactive to light.  Neck: Normal range of motion. Neck supple.  Cardiovascular: Normal rate, regular rhythm, normal heart sounds and intact distal pulses.   No murmur heard. Pulmonary/Chest: Effort normal. No respiratory distress. She has no wheezes. She has no rales.  Central scar down anterior chest from previous bypass  Abdominal: Soft. She exhibits no distension. There is no tenderness.  Musculoskeletal: Normal range of motion. She exhibits no edema or tenderness.  Neurological: She is alert and oriented to person, place, and time.  Skin: Skin is warm and dry. No rash noted. She is not diaphoretic.  Vitals reviewed.   ED Course  Procedures (including critical care time) Labs Review Labs Reviewed  BASIC METABOLIC PANEL - Abnormal; Notable for the following:    Chloride 98 (*)    All other components within normal limits  CBC - Abnormal; Notable for the following:    RBC 3.48 (*)    Hemoglobin 11.0 (*)    HCT 33.1 (*)    All other components within normal limits  HEPATIC FUNCTION PANEL - Abnormal; Notable for the following:    Albumin 3.0 (*)    Alkaline Phosphatase 32 (*)    All other components within normal limits  I-STAT TROPOININ, ED  Randolm Idol, ED    Imaging Review Dg Chest Port 1 View  06/13/2015  CLINICAL DATA:  Chest pain and shortness of breath since 06/02/2015, worsened today. Initial encounter. EXAM: PORTABLE CHEST 1 VIEW COMPARISON:  PA and lateral chest 06/02/2015. FINDINGS: The patient is status post CABG. The lungs are clear. Heart size is normal. No pneumothorax or pleural effusion. IMPRESSION: No acute disease. Electronically Signed   By: Inge Rise M.D.   On: 06/13/2015 07:18   I have personally reviewed and evaluated these images and lab results as  part of my medical decision-making.   EKG Interpretation   Date/Time:  Wednesday June 13 2015 06:32:50 EST Ventricular Rate:  50 PR Interval:  179 QRS Duration: 95 QT Interval:  430 QTC Calculation: 392 R Axis:  45 Text Interpretation:  Sinus rhythm No significant change since last  tracing Confirmed by Zeddie Njie ([id number]) on 06/13/2015 7:03:04 AM      MDM  Patient was seen and evaluated in stable condition. Patient Ardito 2325 mg of aspirin as well as 3 times sublingual nitroglycerin at home before presentation. EKG unremarkable from prior. Troponin normal. Patient asymptomatic at time of evaluation. She did have recurrence of her chest pain which resolved on its own. EKG from that time also without acute finding. In light of patient's extensive cardiac history the case was discussed with the cardiologist on-call who came and evaluated the patient bedside and admitted the patient under the cardiology team's care for further evaluation. Final diagnoses:  None    1. Chest pain, concern for ACS    Harvel Quale, MD 06/13/15 1259

## 2015-06-13 NOTE — H&P (Addendum)
Patient ID: HETHER ANSELMO MRN: [id number], DOB/AGE: [birth year]   Admit date: 06/13/2015   Primary Physician: Lucille Passy, MD Primary Cardiologist: Dr. Tamala Julian  Pt. Profile:  82 y/o female with h/o CAD s/p prior CABG presenting to ED with atypical CP.   Problem List  Past Medical History  Diagnosis Date  . Hx of CABG   . Hypertension   . GERD (gastroesophageal reflux disease)   . Anxiety   . Coronary artery disease   . Arthritis   . Anginal pain ([place])   . Shortness of breath   . Intermediate coronary syndrome ([place])   . Raynauds syndrome   . Coronary atherosclerosis of unspecified type of vessel, native or graft   . Coronary atherosclerosis of autologous vein bypass graft   . Spinal stenosis of lumbar region   . Hypercholesteremia   . Hyperlipidemia     GOAL LDL <70  . Panic disorder   . Osteopenia   . Multiple myeloma ([facility])     Dr. Benay Spice  . Mild chronic anemia   . Urine test positive for microalbuminuria 12/2011  . Ovarian cyst, bilateral     Rt complex  . Allergic rhinitis   . Atypical chest pain 04/25/13    Doing better, does occasionally respond to Nitroglycerin  . Overactive bladder     Past Surgical History  Procedure Laterality Date  . Tonsillectomy and adenoidectomy    . Dilation and curettage of uterus    . Eye surgery      both cataracts  . Colonoscopy    . Upper gastrointestinal endoscopy    . Capsulotomy Right 08/04/2012    Procedure: CAPSULOTOMY;  Surgeon: Colin Rhein, MD;  Location: [facility];  Service: Orthopedics;  Laterality: Right;  RIGHT 2ND TOE METATARSOPHALANGEAL JOINT DORSAL CAPSULOTOMY   . Bunionectomy with hammertoe reconstruction Right 08/04/2012    Procedure:  HAMMERTOE RECONSTRUCTION;  Surgeon: Colin Rhein, MD;  Location: [facility];  Service: Orthopedics;  Laterality: Right;  HAMMER TOE RECONSTRUCTION PROBABLE FLEXOR DIGITORUM LONGUS TO PROXIMAL PHALANX TRANSFER LATERALIZED   . Coronary  artery bypass graft  2007    LIMA to LAD, SVG to RCA  non-ST segment MI 08/2009 (Dr. Tamala Julian)  . Hammer toe surgery Right 08/2012  . Cataract extraction, bilateral    . Coronary angioplasty with stent placement  2014    2 stents  . Left heart catheterization with coronary/graft angiogram N/A 07/09/2012    Procedure: LEFT HEART CATHETERIZATION WITH Beatrix Fetters;  Surgeon: Sinclair Grooms, MD;  Location: [facility] CATH LAB;  Service: Cardiovascular;  Laterality: N/A;     Allergies  Allergies  Allergen Reactions  . Cephalosporins Other (See Comments)    unknown  . Codeine Nausea Only  . Levaquin [Levofloxacin] Shortness Of Breath  . Macrodantin Other (See Comments)    Double vision  . Morphine And Related Other (See Comments)    hallucinations  . Trimethoprim Nausea Only  . Cilostazol Other (See Comments)    Stomach upset  . Dexilant [Dexlansoprazole] Diarrhea  . Erythromycin Nausea And Vomiting    All "mycin" drugs  . Simvastatin Other (See Comments)    Muscle aches  . Sulfa Antibiotics Other (See Comments)    unknown  . Vesicare [Solifenacin] Other (See Comments)    unknown  . Butazolidin [Phenylbutazone] Swelling and Rash  . Food Other (See Comments)    No spicy foods or black pepper, raw onions - causes  gerd  . Ibuprofen Hives and Rash  . Penicillins Rash    Has patient had a PCN reaction causing immediate rash, facial/tongue/throat swelling, SOB or lightheadedness with hypotension: Yes Has patient had a PCN reaction causing severe rash involving mucus membranes or skin necrosis: No Has patient had a PCN reaction that required hospitalization No Has patient had a PCN reaction occurring within the last 10 years: No If all of the above answers are "NO", then may proceed with Cephalosporin use.   Lisbeth Ply [Fesoterodine Fumarate Er] Other (See Comments)    unknown  . Voltaren [Diclofenac Sodium] Rash    HPI  82 y/o female, followed by Dr. Tamala Julian, presenting to the [facility]  ED with atypical chest pain. She has a h/o CAD s/p prior CABG. Her last LHC was 07/09/12 by Dr. Tamala Julian. This showed a widely patent LAD and circumflex. The native RCA was totally occluded in the mid vessel. The saphenous vein graft to the right coronary was widely patent. The LIMA to the LAD was atretic. EF was normal at 60%. Medical therapy was recommended. She has been known to have recurrent stable angina with activity and exacerbated by cold temperatures that is relieved with SL NTG. Additional PMH includes HTN, HLD GERD, Raynauds, multiple myeloma,and spinal stenosis. She self discontinued her statin medication, due to intolerance, several months ago. She also has an ETT at [facility] 01/2015 that was negative for ischemia (Results available in Care Everywhere)  She now presents to the ED with a complaint of chest pain with atypical features. She was seen in the ED on 06/02/15 with similar complaints but was sent home from the ED, as w/u was negative. Since that time, she reports that she has had intermittent recurrence of mild CP. However this morning around 4AM she had an episode of severe left sided chest pain radiating to her left shoulder. Sharp pain. Unlike her previous angina. No exacerbating factors.  She did have relief with SL NTG and ASA.  She is currently CP free.   POC troponin is negative. EKG shows NSR with no signs of ischemia. CXR is normal. CBC shows chronic stable anemia with Hgb of 11.0, which is her baseline. BMP unremarkable.    Home Medications  Prior to Admission medications   Medication Sig Start Date End Date Taking? Authorizing Provider  acetaminophen (TYLENOL) 500 MG tablet Take 1 tablet (500 mg total) by mouth every 6 (six) hours as needed. Patient taking differently: Take 1,000 mg by mouth every 6 (six) hours as needed for headache.  06/15/14  Yes Charlesetta Shanks, MD  aspirin 325 MG tablet Take 325 mg by mouth daily.   Yes Historical Provider, MD  Biotin 5000 MCG CAPS Take 5,000  mcg by mouth every evening.    Yes Historical Provider, MD  calcium carbonate (OS-CAL) 600 MG TABS tablet Take 600 mg by mouth daily with breakfast.   Yes Historical Provider, MD  donepezil (ARICEPT) 5 MG tablet Take 5 mg by mouth at bedtime. 05/25/15 05/24/16 Yes Historical Provider, MD  Hypertonic Nasal Wash (SINUS RINSE NA) Place 1 application into the nose daily.   Yes Historical Provider, MD  isosorbide mononitrate (IMDUR) 60 MG 24 hr tablet Take 1 tablet (60 mg total) by mouth daily. 01/14/13  Yes Delfina Redwood, MD  LORazepam (ATIVAN) 0.5 MG tablet Take 0.25-0.5 mg by mouth at bedtime.    Yes Historical Provider, MD  metoprolol tartrate (LOPRESSOR) 25 MG tablet Take 25 mg by mouth 2 (two)  times daily.   Yes Historical Provider, MD  mirabegron ER (MYRBETRIQ) 50 MG TB24 tablet Take 25 mg by mouth daily.   Yes Historical Provider, MD  montelukast (SINGULAIR) 10 MG tablet Take 10 mg by mouth at bedtime.    Yes Historical Provider, MD  Multiple Vitamin (MULITIVITAMIN WITH MINERALS) TABS Take 1 tablet by mouth daily.   Yes Historical Provider, MD  nitroGLYCERIN (NITROSTAT) 0.4 MG SL tablet Place 0.4 mg under the tongue every 5 (five) minutes as needed for chest pain.   Yes Historical Provider, MD  NON FORMULARY Take 30 mLs by mouth daily. Braggs Apple Cider Vinegar in 6 oz water (mixed with honey)   Yes Historical Provider, MD  Polyethyl Glycol-Propyl Glycol (SYSTANE OP) Apply 1 drop to eye daily as needed (for dry eyes).   Yes Historical Provider, MD  polyethylene glycol (MIRALAX / GLYCOLAX) packet Take 17 g by mouth at bedtime.   Yes Historical Provider, MD  PRESCRIPTION MEDICATION Take 1 tablet by mouth daily.   Yes Historical Provider, MD  sertraline (ZOLOFT) 25 MG tablet Take 75 mg by mouth at bedtime.    Yes Historical Provider, MD  UNABLE TO FIND Inject into the skin once a week. Med Name: [facility] Q 3 weeks to right ankle (lasts 30 minutes)   Yes Historical Provider, MD     Family History  Family History  Problem Relation Age of Onset  . Heart attack Mother   . CAD Mother   . Obesity Sister   . Cancer Paternal Aunt     breast    Social History  Social History   Social History  . Marital Status: Widowed    Spouse Name: N/A  . Number of Children: N/A  . Years of Education: N/A   Occupational History  . Not on file.   Social History Main Topics  . Smoking status: Never Smoker   . Smokeless tobacco: Never Used  . Alcohol Use: Yes     Comment: occasional  . Drug Use: No  . Sexual Activity: Not on file   Other Topics Concern  . Not on file   Social History Narrative     Review of Systems General:  No chills, fever, night sweats or weight changes.  Cardiovascular:  No chest pain, dyspnea on exertion, edema, orthopnea, palpitations, paroxysmal nocturnal dyspnea. Dermatological: No rash, lesions/masses Respiratory: No cough, dyspnea Urologic: No hematuria, dysuria Abdominal:   No nausea, vomiting, diarrhea, bright red blood per rectum, melena, or hematemesis Neurologic:  No visual changes, wkns, changes in mental status. All other systems reviewed and are otherwise negative except as noted above.  Physical Exam  Blood pressure 153/63, pulse 55, temperature 97.6 F (36.4 C), temperature source Oral, resp. rate 14, SpO2 99 %.  General: Pleasant, NAD Psych: Normal affect. Neuro: Alert and oriented X 3. Moves all extremities spontaneously. HEENT: Normal  Neck: Supple without bruits or JVD. Lungs:  Resp regular and unlabored, CTA. Heart: RRR no s3, s4, or murmurs. Abdomen: Soft, non-tender, non-distended, BS + x 4.  Extremities: No clubbing, cyanosis or edema. DP/PT/Radials 2+ and equal bilaterally.  Labs  Troponin [facility] of Care Test)  Recent Labs  06/13/15 0651  TROPIPOC 0.00   No results for input(s): CKTOTAL, CKMB, TROPONINI in the last 72 hours. Lab Results  Component Value Date   WBC 4.7 06/13/2015   HGB 11.0*  06/13/2015   HCT 33.1* 06/13/2015   MCV 95.1 06/13/2015   PLT 250 06/13/2015  Recent Labs Lab 06/13/15 0649  NA 136  K 3.6  CL 98*  CO2 30  BUN 16  CREATININE 0.78  CALCIUM 9.0  PROT 6.8  BILITOT 0.5  ALKPHOS 32*  ALT 16  AST 27  GLUCOSE 93   Lab Results  Component Value Date   CHOL 128 07/09/2012   HDL 73 07/09/2012   LDLCALC 51 07/09/2012   TRIG 22 07/09/2012   Lab Results  Component Value Date   DDIMER 0.44 01/12/2013     Radiology/Studies  Dg Chest 2 View  06/02/2015  CLINICAL DATA:  82 year old female with chest pain EXAM: CHEST  2 VIEW COMPARISON:  Chest radiograph dated 01/18/2015 FINDINGS: Two views of the chest do not demonstrate a focal consolidation. There is no pleural effusion or pneumothorax. The cardiac silhouette is within normal limits. Median sternotomy wires and CABG vascular clips noted. A nodular appearing density in the right upper lung is new from prior study and likely corresponds to the cardiac monitor leads. IMPRESSION: No active cardiopulmonary disease. Electronically Signed   By: Anner Crete M.D.   On: 06/02/2015 19:32   Dg Chest Port 1 View  06/13/2015  CLINICAL DATA:  Chest pain and shortness of breath since 06/02/2015, worsened today. Initial encounter. EXAM: PORTABLE CHEST 1 VIEW COMPARISON:  PA and lateral chest 06/02/2015. FINDINGS: The patient is status post CABG. The lungs are clear. Heart size is normal. No pneumothorax or pleural effusion. IMPRESSION: No acute disease. Electronically Signed   By: Inge Rise M.D.   On: 06/13/2015 07:18    ASSESSMENT AND PLAN  1. Chest Pain: mostly atypical features, however she has a known h/o CAD s/p CABG. Her last cath in 2014 showed patent SVG-RCA and an atretic LIMA to the LAD. She had an ETT 01/2015 at [facility] that was negative for ischemia. She is currently CP free. Given her history, we will admit for observation. We will continue to cycle enzymes x 3 to r/o MI. We will make her NPO at  midnight and reassess her symptoms in the am. If still with recurrent pain, can consider LHC to reassess native coronary and graft anatomy.    2. HLD: Given her CAD and h/o HLD, we will restart her on low dose Crestor, 5 mg daily, to see how she tolerates.    Signed, Lyda Jester, PA-C 06/13/2015, 10:39 AM   I have examined the patient and reviewed assessment and plan and discussed with patient.  Agree with above as stated.  HTN borderline controlled. Several atypical features to her pain.  If she rules out and is feeling better in AM, would continue medical therapy.  If she has recurrent rest pain, then would consider cath in AM.  Stress test negative at [facility] a few months ago.  Not useful to repeat stress at this time.   VARANASI,JAYADEEP S.

## 2015-06-14 DIAGNOSIS — R079 Chest pain, unspecified: Secondary | ICD-10-CM | POA: Diagnosis not present

## 2015-06-14 LAB — CBC
HEMATOCRIT: 33.2 % — AB (ref 36.0–46.0)
HEMOGLOBIN: 11.1 g/dL — AB (ref 12.0–15.0)
MCH: 31.7 pg (ref 26.0–34.0)
MCHC: 33.4 g/dL (ref 30.0–36.0)
MCV: 94.9 fL (ref 78.0–100.0)
Platelets: 269 10*3/uL (ref 150–400)
RBC: 3.5 MIL/uL — AB (ref 3.87–5.11)
RDW: 15 % (ref 11.5–15.5)
WBC: 4.6 10*3/uL (ref 4.0–10.5)

## 2015-06-14 LAB — BASIC METABOLIC PANEL
ANION GAP: 11 (ref 5–15)
BUN: 18 mg/dL (ref 6–20)
CALCIUM: 9.1 mg/dL (ref 8.9–10.3)
CO2: 27 mmol/L (ref 22–32)
Chloride: 99 mmol/L — ABNORMAL LOW (ref 101–111)
Creatinine, Ser: 0.88 mg/dL (ref 0.44–1.00)
GFR calc Af Amer: >60 mL/min (ref >60)
GFR, EST NON AFRICAN AMERICAN: 60 mL/min — AB (ref >60)
GLUCOSE: 91 mg/dL (ref 65–99)
POTASSIUM: 3.7 mmol/L (ref 3.5–5.1)
Sodium: 137 mmol/L (ref 135–145)

## 2015-06-14 LAB — TROPONIN I: Troponin I: <0.03 ng/mL (ref <0.031)

## 2015-06-14 MED ORDER — ISOSORBIDE MONONITRATE ER 60 MG PO TB24: 60.0000 mg | ORAL_TABLET | Freq: Every day | ORAL | Status: DC

## 2015-06-14 MED ORDER — METOPROLOL TARTRATE 25 MG PO TABS: 25.0000 mg | ORAL_TABLET | Freq: Two times a day (BID) | ORAL | Status: DC

## 2015-06-14 MED ORDER — ROSUVASTATIN CALCIUM 5 MG PO TABS: 5.0000 mg | ORAL_TABLET | Freq: Every day | ORAL | Status: DC

## 2015-06-14 NOTE — Discharge Summary (Signed)
Discharge Summary   Patient ID: [name],  MRN: [id number], DOB/AGE: Apr 29, 1932 82 y.o.  Admit date: 06/13/2015 Discharge date: 06/14/2015  Primary Care Provider: Juluis Rainier A Primary Cardiologist: Dr. Tamala Julian  Discharge Diagnoses Principal Problem:   Chest pain with moderate risk for cardiac etiology Active Problems:   Hyperlipidemia   Essential hypertension   CORONARY ATHEROSCLEROSIS, ARTERY BYPASS GRAFT   RAYNAUDS SYNDROME   GERD (gastroesophageal reflux disease)   Depression   Multiple myeloma (HCC)   Allergies Allergies  Allergen Reactions  . Cephalosporins Other (See Comments)    unknown  . Codeine Nausea Only  . Levaquin [Levofloxacin] Shortness Of Breath  . Macrodantin Other (See Comments)    Double vision  . Morphine And Related Other (See Comments)    hallucinations  . Trimethoprim Nausea Only  . Cilostazol Other (See Comments)    Stomach upset  . Dexilant [Dexlansoprazole] Diarrhea  . Erythromycin Nausea And Vomiting    All "mycin" drugs  . Simvastatin Other (See Comments)    Muscle aches  . Sulfa Antibiotics Other (See Comments)    unknown  . Vesicare [Solifenacin] Other (See Comments)    unknown  . Butazolidin [Phenylbutazone] Swelling and Rash  . Food Other (See Comments)    No spicy foods or black pepper, raw onions - causes gerd  . Ibuprofen Hives and Rash  . Penicillins Rash    Has patient had a PCN reaction causing immediate rash, facial/tongue/throat swelling, SOB or lightheadedness with hypotension: Yes Has patient had a PCN reaction causing severe rash involving mucus membranes or skin necrosis: No Has patient had a PCN reaction that required hospitalization No Has patient had a PCN reaction occurring within the last 10 years: No If all of the above answers are "NO", then may proceed with Cephalosporin use.   Lisbeth Ply [Fesoterodine Fumarate Er] Other (See Comments)    unknown  . Voltaren [Diclofenac Sodium] Rash      Hospital Course  The patient is a 82 year old female with history of CAD s/p CABG, HTN, Raynauds syndrome, GERD and HLD. Her last cardiac catheterization was on 07/09/2012 by Dr. Tamala Julian which showed widely patent LAD and the left circumflex, native RCA was totally occluded in mid vessel, SVG to RCA was widely patent, LIMA to LAD was atretic, EF normal at 60%. Medical therapy was recommended. She has been noted to have recurrent stable angina with activity and exacerbated by cold temperature that is relieved with sublingual nitroglycerin.   She presented to the ED on 06/13/2015 with complaint of atypical chest pain. She was seen in the ED in December 2016 with similar complaint however was sent home from the ED as her previous workup was negative. She had a negative stress test at [facility] a month ago. She reports she has been having intermittent recurrent mild chest pain. Around 4 AM in the morning of arrival, she had severe episode of left-sided chest pain radiating to left shoulder. She described the pain as a sharp pain that is unlike her previous angina. Given her previous history, she was admitted to cardiology service for observation overnight.  She was seen in the morning of 06/14/2015, her chest discomfort has improved. Serial enzyme was negative. A low-dose Crestor 5 mg per day was added to her medical regimen. She is deemed stable for discharge from cardiology perspective. She has a previously arranged follow-up with Dr. Tamala Julian within one month.   Discharge Vitals Blood pressure 137/60, pulse 52, temperature 97.7 F (36.5  C), temperature source Oral, resp. rate 18, weight 106 lb 3.2 oz (48.172 kg), SpO2 96 %.  Filed Weights   06/14/15 0637  Weight: 106 lb 3.2 oz (48.172 kg)    Labs  CBC  Recent Labs  06/13/15 1413 06/14/15 0104  WBC 4.3 4.6  HGB 10.5* 11.1*  HCT 32.0* 33.2*  MCV 95.2 94.9  PLT 262 599   Basic Metabolic Panel  Recent Labs  06/13/15 0649 06/13/15 1413  06/14/15 0104  NA 136  --  137  K 3.6  --  3.7  CL 98*  --  99*  CO2 30  --  27  GLUCOSE 93  --  91  BUN 16  --  18  CREATININE 0.78 0.79 0.88  CALCIUM 9.0  --  9.1   Liver Function Tests  Recent Labs  06/13/15 0649  AST 27  ALT 16  ALKPHOS 32*  BILITOT 0.5  PROT 6.8  ALBUMIN 3.0*   Cardiac Enzymes  Recent Labs  06/13/15 1413 06/13/15 1944 06/14/15 0104  TROPONINI <0.03 <0.03 <0.03    Disposition  Pt is being discharged home today in good condition.  Follow-up Plans & Appointments      Follow-up Information    Follow up with Sinclair Grooms, MD On 07/06/2015.   Specialty:  Cardiology   Why:  10:15am   Contact information:   [address] [place] [address] [phone]       Discharge Medications    Medication List    TAKE these medications        acetaminophen 500 MG tablet  Commonly known as:  TYLENOL  Take 1 tablet (500 mg total) by mouth every 6 (six) hours as needed.     aspirin 325 MG tablet  Take 325 mg by mouth daily.     Biotin 5000 MCG Caps  Take 5,000 mcg by mouth every evening.     calcium carbonate 600 MG Tabs tablet  Commonly known as:  OS-CAL  Take 600 mg by mouth daily with breakfast.     donepezil 5 MG tablet  Commonly known as:  ARICEPT  Take 5 mg by mouth at bedtime.     isosorbide mononitrate 60 MG 24 hr tablet  Commonly known as:  IMDUR  Take 1 tablet (60 mg total) by mouth daily.     LORazepam 0.5 MG tablet  Commonly known as:  ATIVAN  Take 0.25-0.5 mg by mouth at bedtime.     metoprolol tartrate 25 MG tablet  Commonly known as:  LOPRESSOR  Take 1 tablet (25 mg total) by mouth 2 (two) times daily.     mirabegron ER 50 MG Tb24 tablet  Commonly known as:  MYRBETRIQ  Take 25 mg by mouth daily.     montelukast 10 MG tablet  Commonly known as:  SINGULAIR  Take 10 mg by mouth at bedtime.     multivitamin with minerals Tabs tablet  Take 1 tablet by mouth daily.     nitroGLYCERIN  0.4 MG SL tablet  Commonly known as:  NITROSTAT  Place 0.4 mg under the tongue every 5 (five) minutes as needed for chest pain.     NON FORMULARY  Take 30 mLs by mouth daily. Braggs Apple Cider Vinegar in 6 oz water (mixed with honey)     polyethylene glycol packet  Commonly known as:  MIRALAX / GLYCOLAX  Take 17 g by mouth at bedtime.     predniSONE 5  MG tablet  Commonly known as:  DELTASONE  Take 10 mg by mouth daily with breakfast.     rosuvastatin 5 MG tablet  Commonly known as:  CRESTOR  Take 1 tablet (5 mg total) by mouth daily at 6 PM.     sertraline 25 MG tablet  Commonly known as:  ZOLOFT  Take 75 mg by mouth at bedtime.     SINUS RINSE NA  Place 1 application into the nose daily.     SYSTANE OP  Apply 1 drop to eye daily as needed (for dry eyes).     UNABLE TO FIND  Inject into the skin once a week. Med Name: [facility] per [facility] Q 3 weeks to right ankle (lasts 30 minutes)        Duration of Discharge Encounter   Greater than 30 minutes including physician time.  Hilbert Corrigan PA-C Pager: [phone] 06/14/2015, 10:39 PM   Attending Note:   The patient was seen and examined.  Agree with assessment and plan as noted above.  Changes made to the above note as needed.  Pt presents with atypical CP ,  Troponin levels are negative.  Stable for DC. See progress note from same day    Ramond Dial., MD, [facility] 06/15/2015, 12:16 PM [address],  [place] Pager [phone]

## 2015-06-14 NOTE — Discharge Instructions (Signed)

## 2015-06-14 NOTE — Progress Notes (Signed)
PROGRESS NOTE  Subjective:   82 y/o female, followed by Dr. Tamala Julian, presenting to the [facility] ED with atypical chest pain. She has a h/o CAD s/p prior CABG. Her last LHC was 07/09/12 by Dr. Tamala Julian. This showed a widely patent LAD and circumflex. The native RCA was totally occluded in the mid vessel. The saphenous vein graft to the right coronary was widely patent. The LIMA to the LAD was atretic. EF was normal at 60%. Medical therapy was recommended.  She presented with atypical CP She just had a stress myoview a month ago in [place].  troponins are negative  Doing better  Objective:    Vital Signs:   Temp:  [97.4 F (36.3 C)-98.6 F (37 C)] 97.7 F (36.5 C) (01/05 0828) Pulse Rate:  [51-61] 52 (01/05 0828) Resp:  [14-18] 18 (01/05 0637) BP: (109-159)/(48-62) 137/60 mmHg (01/05 0828) SpO2:  [92 %-99 %] 96 % (01/05 0828) Weight:  [106 lb 3.2 oz (48.172 kg)] 106 lb 3.2 oz (48.172 kg) (01/05 0637)  Last BM Date: 06/13/15   24-hour weight change: Weight change:   Weight trends: Filed Weights   06/14/15 [id number]  Weight: 106 lb 3.2 oz (48.172 kg)    Intake/Output:  01/04 0701 - 01/05 0700 In: 480 [P.O.:480] Out: 1175 [Urine:1175]     Physical Exam: BP 137/60 mmHg  Pulse 52  Temp(Src) 97.7 F (36.5 C) (Oral)  Resp 18  Wt 106 lb 3.2 oz (48.172 kg)  SpO2 96%  Wt Readings from Last 3 Encounters:  06/14/15 106 lb 3.2 oz (48.172 kg)  06/02/15 111 lb (50.349 kg)  04/10/15 112 lb (50.803 kg)    General: Vital signs reviewed and noted.   Head: Normocephalic, atraumatic.  Eyes: conjunctivae/corneas clear.  EOM's intact.   Throat: normal  Neck:  normal   Lungs:    clear   Heart:  RR   Abdomen:  Soft, non-tender, non-distended    Extremities: Normal , no c/c/e    Neurologic: A&O X3, CN II - XII are grossly intact.   Psych: Normal     Labs: BMET:  Recent Labs  06/13/15 0649 06/13/15 1413 06/14/15 0104  NA 136  --  137  K 3.6  --  3.7  CL 98*  --  99*    CO2 30  --  27  GLUCOSE 93  --  91  BUN 16  --  18  CREATININE 0.78 0.79 0.88  CALCIUM 9.0  --  9.1    Liver function tests:  Recent Labs  06/13/15 0649  AST 27  ALT 16  ALKPHOS 32*  BILITOT 0.5  PROT 6.8  ALBUMIN 3.0*   No results for input(s): LIPASE, AMYLASE in the last 72 hours.  CBC:  Recent Labs  06/13/15 1413 06/14/15 0104  WBC 4.3 4.6  HGB 10.5* 11.1*  HCT 32.0* 33.2*  MCV 95.2 94.9  PLT 262 269    Cardiac Enzymes:  Recent Labs  06/13/15 1413 06/13/15 1944 06/14/15 0104  TROPONINI <0.03 <0.03 <0.03    Coagulation Studies: No results for input(s): LABPROT, INR in the last 72 hours.  Other: Invalid input(s): POCBNP No results for input(s): DDIMER in the last 72 hours. No results for input(s): HGBA1C in the last 72 hours. No results for input(s): CHOL, HDL, LDLCALC, TRIG, CHOLHDL in the last 72 hours. No results for input(s): TSH, T4TOTAL, T3FREE, THYROIDAB in the last 72 hours.  Invalid input(s): FREET3 No results for input(s):  VITAMINB12, FOLATE, FERRITIN, TIBC, IRON, RETICCTPCT in the last 72 hours.   Other results:  EKG  ( personally reviewed )  - NSR , no ST or T wave changes.   Medications:    Infusions:    Scheduled Medications: . aspirin  325 mg Oral Daily  . calcium carbonate  625 mg Oral Q breakfast  . donepezil  5 mg Oral QHS  . heparin  5,000 Units Subcutaneous 3 times per day  . isosorbide mononitrate  60 mg Oral Daily  . LORazepam  0.25-0.5 mg Oral QHS  . metoprolol tartrate  25 mg Oral BID  . mirabegron ER  25 mg Oral Daily  . montelukast  10 mg Oral QHS  . nitroGLYCERIN  0.4 mg Sublingual Once  . polyethylene glycol  17 g Oral QHS  . rosuvastatin  5 mg Oral q1800  . sertraline  75 mg Oral QHS    Assessment/ Plan:   Active Problems:   Chest pain with moderate risk for cardiac etiology  1. Atypical CP:   ECG is nonacute.  Troponins are negative. Just had a stress myoview about a month ago. Will DC to  home She has an appt. With Dr. Tamala Julian in about 3 weeks.  She will call us back if she has significant CP  Was just restarted on Crestor 5 a day  Please send in a script to [place]      Disposition:   DC to home  Length of Stay: 1  Thayer Headings, Brooke Bonito., MD, [facility] 06/14/2015, 11:05 AM Office [phone] Pager [phone]

## 2015-06-14 NOTE — Progress Notes (Signed)
Pt discharged to home via W/C, condition stable, discharge education given with verbal understanding stated, accompanied by family member.  Edward Qualia RN

## 2015-06-19 DIAGNOSIS — H61001 Unspecified perichondritis of right external ear: Secondary | ICD-10-CM | POA: Diagnosis not present

## 2015-06-19 DIAGNOSIS — L821 Other seborrheic keratosis: Secondary | ICD-10-CM | POA: Diagnosis not present

## 2015-06-19 DIAGNOSIS — L720 Epidermal cyst: Secondary | ICD-10-CM | POA: Diagnosis not present

## 2015-06-21 DIAGNOSIS — N3941 Urge incontinence: Secondary | ICD-10-CM | POA: Diagnosis not present

## 2015-06-25 DIAGNOSIS — K5900 Constipation, unspecified: Secondary | ICD-10-CM | POA: Diagnosis not present

## 2015-06-25 DIAGNOSIS — K219 Gastro-esophageal reflux disease without esophagitis: Secondary | ICD-10-CM | POA: Diagnosis not present

## 2015-06-26 DIAGNOSIS — M353 Polymyalgia rheumatica: Secondary | ICD-10-CM | POA: Diagnosis not present

## 2015-06-26 DIAGNOSIS — I7300 Raynaud's syndrome without gangrene: Secondary | ICD-10-CM | POA: Diagnosis not present

## 2015-06-26 DIAGNOSIS — Z79899 Other long term (current) drug therapy: Secondary | ICD-10-CM | POA: Diagnosis not present

## 2015-07-04 ENCOUNTER — Ambulatory Visit: Payer: PPO | Admitting: Psychology

## 2015-07-04 DIAGNOSIS — F0280 Dementia in other diseases classified elsewhere without behavioral disturbance: Secondary | ICD-10-CM | POA: Diagnosis not present

## 2015-07-04 DIAGNOSIS — G309 Alzheimer's disease, unspecified: Secondary | ICD-10-CM | POA: Diagnosis not present

## 2015-07-04 DIAGNOSIS — F411 Generalized anxiety disorder: Secondary | ICD-10-CM | POA: Diagnosis not present

## 2015-07-04 DIAGNOSIS — Z9181 History of falling: Secondary | ICD-10-CM | POA: Insufficient documentation

## 2015-07-04 DIAGNOSIS — G479 Sleep disorder, unspecified: Secondary | ICD-10-CM | POA: Insufficient documentation

## 2015-07-04 DIAGNOSIS — G609 Hereditary and idiopathic neuropathy, unspecified: Secondary | ICD-10-CM | POA: Insufficient documentation

## 2015-07-04 DIAGNOSIS — F0150 Vascular dementia without behavioral disturbance: Secondary | ICD-10-CM | POA: Insufficient documentation

## 2015-07-04 DIAGNOSIS — M5416 Radiculopathy, lumbar region: Secondary | ICD-10-CM | POA: Insufficient documentation

## 2015-07-04 DIAGNOSIS — R413 Other amnesia: Secondary | ICD-10-CM | POA: Diagnosis not present

## 2015-07-06 ENCOUNTER — Encounter: Payer: Self-pay | Admitting: Interventional Cardiology

## 2015-07-06 ENCOUNTER — Ambulatory Visit (INDEPENDENT_AMBULATORY_CARE_PROVIDER_SITE_OTHER): Payer: PPO | Admitting: Interventional Cardiology

## 2015-07-06 VITALS — BP 110/54 | HR 49 | Ht 63.0 in | Wt 111.1 lb

## 2015-07-06 DIAGNOSIS — I25810 Atherosclerosis of coronary artery bypass graft(s) without angina pectoris: Secondary | ICD-10-CM | POA: Diagnosis not present

## 2015-07-06 DIAGNOSIS — I10 Essential (primary) hypertension: Secondary | ICD-10-CM

## 2015-07-06 DIAGNOSIS — E785 Hyperlipidemia, unspecified: Secondary | ICD-10-CM

## 2015-07-06 NOTE — Progress Notes (Addendum)
Cardiology Office Note   Date:  07/06/2015   ID:  TRIVIA HEFFELFINGER, DOB July 03, 1931, MRN [id number]  PCP:  Lucille Passy, MD  Cardiologist:  Sinclair Grooms, MD   Chief Complaint  Patient presents with  . Coronary Artery Disease      History of Present Illness: [name] is a 82 y.o. female who presents for coronary atherosclerosis, hypertension,  Hyperlipidemia,and multiple somatic complaints.  The patient has been quite displeased with the care that she has received from a cardiac standpoint here and with other physicians. On our watch , she has had multiple cardiac admissions and procedures to investigate various complaints.. There is a prior history of coronary artery bypass grafting. She has multiple musculoskeletal complaints. Recent evaluation at [facility] by Dr. Elonda Husky did not turn up any significant cardiovascular abnormality. This was performed within the last 6 months. She has decided to leave Dr. Elonda Husky and come back here. She has no specific complaints other than muscle aches, inability to sleep, anxiety that a  "major"problem is being missed, decreased memory, and multiple other concerns.  She saw Dr. Elonda Husky initially because I did not address all of her complaints on the last office visit. Those complaints included constipation, muscle pain, dizziness, blood in her urine, abdominal pain, vision disturbance, urinary incontinence, decreased memory.     Past Medical History  Diagnosis Date  . Hx of CABG   . Hypertension   . GERD (gastroesophageal reflux disease)   . Anxiety   . Coronary artery disease   . Arthritis   . Anginal pain ([place])   . Shortness of breath   . Intermediate coronary syndrome ([place])   . Raynauds syndrome   . Coronary atherosclerosis of unspecified type of vessel, native or graft   . Coronary atherosclerosis of autologous vein bypass graft   . Spinal stenosis of lumbar region   . Hypercholesteremia   . Hyperlipidemia       GOAL LDL <70  . Panic disorder   . Osteopenia   . Multiple myeloma ([facility])     Dr. Benay Spice  . Mild chronic anemia   . Urine test positive for microalbuminuria 12/2011  . Ovarian cyst, bilateral     Rt complex  . Allergic rhinitis   . Atypical chest pain 04/25/13    Doing better, does occasionally respond to Nitroglycerin  . Overactive bladder   . Incontinent of urine     Past Surgical History  Procedure Laterality Date  . Tonsillectomy and adenoidectomy    . Dilation and curettage of uterus    . Eye surgery      both cataracts  . Colonoscopy    . Upper gastrointestinal endoscopy    . Capsulotomy Right 08/04/2012    Procedure: CAPSULOTOMY;  Surgeon: Colin Rhein, MD;  Location: [facility];  Service: Orthopedics;  Laterality: Right;  RIGHT 2ND TOE METATARSOPHALANGEAL JOINT DORSAL CAPSULOTOMY   . Bunionectomy with hammertoe reconstruction Right 08/04/2012    Procedure:  HAMMERTOE RECONSTRUCTION;  Surgeon: Colin Rhein, MD;  Location: [facility];  Service: Orthopedics;  Laterality: Right;  HAMMER TOE RECONSTRUCTION PROBABLE FLEXOR DIGITORUM LONGUS TO PROXIMAL PHALANX TRANSFER LATERALIZED   . Coronary artery bypass graft  2007    LIMA to LAD, SVG to RCA  non-ST segment MI 08/2009 (Dr. Tamala Julian)  . Hammer toe surgery Right 08/2012  . Cataract extraction, bilateral    . Coronary angioplasty with stent placement  2014  2 stents  . Left heart catheterization with coronary/graft angiogram N/A 07/09/2012    Procedure: LEFT HEART CATHETERIZATION WITH Beatrix Fetters;  Surgeon: Sinclair Grooms, MD;  Location: [facility] CATH LAB;  Service: Cardiovascular;  Laterality: N/A;     Current Outpatient Prescriptions  Medication Sig Dispense Refill  . acetaminophen (TYLENOL) 500 MG tablet Take 1,000 mg by mouth 2 (two) times daily.    . Albuterol Sulfate (PROAIR RESPICLICK IN) Inhale 2 puffs into the lungs 4 (four) times daily.    Marland Kitchen aspirin 325 MG tablet Take  325 mg by mouth daily.    . Biotin 5000 MCG CAPS Take 5,000 mcg by mouth every evening.     . calcium carbonate (OS-CAL) 600 MG TABS tablet Take 600 mg by mouth daily with breakfast.    . Coenzyme Q10 (COQ-10) 100 MG CAPS Take 1 capsule by mouth at bedtime.    . donepezil (ARICEPT) 10 MG tablet Take 10 mg by mouth at bedtime.    . famotidine (PEPCID) 40 MG tablet Take 40 mg by mouth at bedtime.    . Hypertonic Nasal Wash (SINUS RINSE NA) Place 1 application into the nose daily.    . isosorbide mononitrate (IMDUR) 60 MG 24 hr tablet Take 1 tablet (60 mg total) by mouth daily. 90 tablet 3  . LORazepam (ATIVAN) 0.5 MG tablet Take 0.5 mg by mouth at bedtime as needed for sleep.     . metoprolol tartrate (LOPRESSOR) 25 MG tablet Take 1 tablet (25 mg total) by mouth 2 (two) times daily. 180 tablet 3  . mirabegron ER (MYRBETRIQ) 50 MG TB24 tablet Take 25 mg by mouth daily.    . montelukast (SINGULAIR) 10 MG tablet Take 10 mg by mouth at bedtime.     . Multiple Vitamin (MULITIVITAMIN WITH MINERALS) TABS Take 1 tablet by mouth daily.    . nitroGLYCERIN (NITROSTAT) 0.4 MG SL tablet Place 0.4 mg under the tongue every 5 (five) minutes as needed for chest pain.    . NON FORMULARY Take 30 mLs by mouth daily. Braggs Apple Cider Vinegar in 6 oz water (mixed with honey)    . Polyethyl Glycol-Propyl Glycol (SYSTANE OP) Apply 1 drop to eye daily as needed (for dry eyes).    . polyethylene glycol (MIRALAX / GLYCOLAX) packet Take 17 g by mouth at bedtime.    . predniSONE (DELTASONE) 5 MG tablet Take 5 mg by mouth daily with breakfast.     . rosuvastatin (CRESTOR) 5 MG tablet Take 1 tablet (5 mg total) by mouth daily at 6 PM. 90 tablet 3  . sertraline (ZOLOFT) 25 MG tablet Take 50 mg by mouth at bedtime.     Marland Kitchen UNABLE TO FIND Inject into the skin once a week. Med Name: [facility] per [facility] 3 weeks to right ankle (lasts 30 minutes)     No current facility-administered medications for this visit.     Allergies:   Cephalosporins; Codeine; Levaquin; Macrodantin; Morphine and related; Trimethoprim; Cilostazol; Dexilant; Erythromycin; Nitrofurantoin; Simvastatin; Sulfa antibiotics; Vesicare; Butazolidin; Food; Ibuprofen; Penicillins; Toviaz; and Voltaren    Social History:  The patient  reports that she has never smoked. She has never used smokeless tobacco. She reports that she drinks alcohol. She reports that she does not use illicit drugs.   Family History:  The patient's family history includes CAD in her mother; Cancer in her paternal aunt; Heart attack in her mother; Obesity in her sister.    ROS:  Please see the history of present illness.   Otherwise, review of systems are positive for please see above.   All other systems are reviewed and negative.    PHYSICAL EXAM: VS:  BP 110/54 mmHg  Pulse 49  Ht 5' 3" (1.6 m)  Wt 111 lb 1.9 oz (50.404 kg)  BMI 19.69 kg/m2 , BMI Body mass index is 19.69 kg/(m^2). GEN: Well nourished, well developed, in no acute distress HEENT: normal Neck: no JVD, carotid bruits, or masses Cardiac: RRR.  There is no murmur, rub, or gallop. There is no edema. Respiratory:  clear to auscultation bilaterally, normal work of breathing. GI: soft, nontender, nondistended, + BS MS: no deformity or atrophy Skin: warm and dry, no rash Neuro:  Strength and sensation are intact Psych: euthymic mood, full affect   EKG:  EKG is no ordered today   Recent Labs: 06/13/2015: ALT 16 06/14/2015: BUN 18; Creatinine, Ser 0.88; Hemoglobin 11.1*; Platelets 269; Potassium 3.7; Sodium 137    Lipid Panel    Component Value Date/Time   CHOL 128 07/09/2012 0105   TRIG 22 07/09/2012 0105   HDL 73 07/09/2012 0105   CHOLHDL 1.8 07/09/2012 0105   VLDL 4 07/09/2012 0105   LDLCALC 51 07/09/2012 0105      Wt Readings from Last 3 Encounters:  07/06/15 111 lb 1.9 oz (50.404 kg)  06/14/15 106 lb 3.2 oz (48.172 kg)  06/02/15 111 lb (50.349 kg)      Other studies  Reviewed: Additional studies/ records that were reviewed today include: Records from [place] [facility], Dr. Baxter Hire.. The findings include normal 2-D Doppler echocardiogram with EF of 65%. Unremarkable Holter monitor. And she also describes a pharmacologic nuclear study that was unremarkable..    ASSESSMENT AND PLAN:  1. Atherosclerosis of coronary artery bypass graft of native heart without angina pectoris  coronary atherosclerosis , stable without angina  2. Essential hypertension  controlled  3. Hyperlipidemia  managed by primary care   4. Multiple somatic complaints This is a area of her malcontent. I had a long discussion with the patient. This needs to be managed by primary care. I instructed her that care should be primarily through internal medicine or primary care rather than specialty care.    Current medicines are reviewed at length with the patient today.  The patient has the following concerns regarding medicines:  none.  The following changes/actions have been instituted:     Primary care gatekeeper   Minimize specialty care   Discharge from my care and resume follow-up with Dr. Elonda Husky  Labs/ tests ordered today include:  No orders of the defined types were placed in this encounter.   The office visit was lengthy, as I needed to hear  The total tally of her complaints. Greater than 70% of the office visit time was spent ink also and discussion.  Multiple questions were answered that she had written in her note but. Records from the most recent cardiac evaluation done elsewhere were carefully reviewed.  Disposition:   FU with HS in  Not needed with me    Signed, Sinclair Grooms, MD  07/06/2015 10:20 AM    [facility] [address], [place], [place]  [address] Phone: [phone]; Fax: [phone]

## 2015-07-06 NOTE — Patient Instructions (Signed)
Your physician recommends that you continue on your current medications as directed. Please refer to the Current Medication list given to you today. Your physician recommends that you schedule a follow-up appointment WITH YOUR PRIMARY CARE PROVIDER.

## 2015-07-09 ENCOUNTER — Ambulatory Visit (HOSPITAL_BASED_OUTPATIENT_CLINIC_OR_DEPARTMENT_OTHER): Payer: PPO | Admitting: Oncology

## 2015-07-09 VITALS — BP 122/55 | HR 56 | Temp 97.8°F | Resp 17 | Ht 63.0 in | Wt 112.6 lb

## 2015-07-09 DIAGNOSIS — D630 Anemia in neoplastic disease: Secondary | ICD-10-CM | POA: Diagnosis not present

## 2015-07-09 DIAGNOSIS — M353 Polymyalgia rheumatica: Secondary | ICD-10-CM

## 2015-07-09 DIAGNOSIS — C9000 Multiple myeloma not having achieved remission: Secondary | ICD-10-CM | POA: Diagnosis not present

## 2015-07-09 DIAGNOSIS — M545 Low back pain: Secondary | ICD-10-CM | POA: Diagnosis not present

## 2015-07-09 DIAGNOSIS — G8929 Other chronic pain: Secondary | ICD-10-CM

## 2015-07-09 NOTE — Progress Notes (Signed)
  [name] OFFICE PROGRESS NOTE   Diagnosis: Indolent multiple myeloma   INTERVAL HISTORY:   [name] returns as scheduled. She had a negative bone survey on 07/21/2014.   She reports being diagnosed with polymyalgia rheumatica and is followed by a rheumatologist in [place]. She is maintained on prednisone at a dose of 5 mg daily. She reports malaise and joint pain improved with prednisone. She was mostly symptomatic with pain at the shoulders and arms.   Objective:  Vital signs in last 24 hours:  Blood pressure 122/55, pulse 56, temperature 97.8 F (36.6 C), temperature source Oral, resp. rate 17, height '5\' 3"'$  (1.6 m), weight 112 lb 9.6 oz (51.075 kg), SpO2 98 %.    HEENT: Neck without mass Lymphatics: No cervical, supraclavicular, or axillary nodes Resp: Lungs clear bilaterally Cardio: Regular rate and rhythm GI: No hepatosplenomegaly Vascular: Trace edema at the right greater than left lower leg   Lab Results:  Lab Results  Component Value Date   WBC 4.6 06/14/2015   HGB 11.1* 06/14/2015   HCT 33.2* 06/14/2015   MCV 94.9 06/14/2015   PLT 269 06/14/2015   NEUTROABS 1.6* 10/16/2013   06/06/2015: IgG 2280, IgA 46, IgM 22 Serum M spike 2.0, monoclonal IgG lambda present  Lambda free light chains 22.4 white count 5.8, hemoglobin 11.9, platelet is 268,000, ANC 3.8 Creatinine 0.98, calcium 9.3, albumin 3.8   Medications: I have reviewed the patient's current medications.  Assessment/Plan: 1. Indolent multiple myeloma, asymptomatic. No clinical evidence of disease progression. A metastatic bone survey in February 2016 was negative for lytic lesions. The serum M spike and serum free lambda light chains are stable 2. Mild anemia secondary to multiple myeloma 3. History of mild neutropenia, likely related to multiple myeloma. The neutrophil count remains in the normal range. 4. History of coronary artery disease. 5. Osteopenia. 6. History of  multiple urinary tract infections, followed by Dr. Diona Fanti. 7. "Raynaud" syndrome. 8. Chronic back pain. 9. History of hyponatremia. 10. Right foot surgery February 2014 11. Bilateral ovarian cysts-followed by GYN oncology    Disposition:  [name] is stable from a hematologic standpoint. There is no clinical evidence for progression of multiple myeloma. She would like to continue follow-up at the [facility] She will return for an office and lab visit in one year. She will continue follow-up with her rheumatologist for management of polymyalgia rheumatica. I told her the erythrocyte sedimentation rate could be elevated secondary to multiple myeloma as opposed to PMR.  She remains at increased risk for infections and will seek medical attention for symptoms of an infection. She reports being up-to-date on vaccines.  Betsy Coder, MD  07/09/2015  3:09 PM

## 2015-07-16 ENCOUNTER — Telehealth: Payer: Self-pay | Admitting: Oncology

## 2015-07-16 NOTE — Telephone Encounter (Signed)
Spoke with patient re annual f/u for 07/08/16.

## 2015-07-24 DIAGNOSIS — N3941 Urge incontinence: Secondary | ICD-10-CM | POA: Diagnosis not present

## 2015-07-25 DIAGNOSIS — I7300 Raynaud's syndrome without gangrene: Secondary | ICD-10-CM | POA: Diagnosis not present

## 2015-07-25 DIAGNOSIS — N3281 Overactive bladder: Secondary | ICD-10-CM | POA: Insufficient documentation

## 2015-07-25 DIAGNOSIS — R748 Abnormal levels of other serum enzymes: Secondary | ICD-10-CM | POA: Insufficient documentation

## 2015-07-25 DIAGNOSIS — G479 Sleep disorder, unspecified: Secondary | ICD-10-CM | POA: Insufficient documentation

## 2015-07-25 DIAGNOSIS — Z79899 Other long term (current) drug therapy: Secondary | ICD-10-CM | POA: Diagnosis not present

## 2015-07-25 DIAGNOSIS — M353 Polymyalgia rheumatica: Secondary | ICD-10-CM | POA: Diagnosis not present

## 2015-07-25 DIAGNOSIS — M5412 Radiculopathy, cervical region: Secondary | ICD-10-CM | POA: Insufficient documentation

## 2015-07-25 DIAGNOSIS — M159 Polyosteoarthritis, unspecified: Secondary | ICD-10-CM | POA: Insufficient documentation

## 2015-07-25 DIAGNOSIS — G2581 Restless legs syndrome: Secondary | ICD-10-CM | POA: Insufficient documentation

## 2015-07-25 DIAGNOSIS — I2510 Atherosclerotic heart disease of native coronary artery without angina pectoris: Secondary | ICD-10-CM | POA: Insufficient documentation

## 2015-07-31 ENCOUNTER — Encounter: Payer: Self-pay | Admitting: Oncology

## 2015-08-07 DIAGNOSIS — N3946 Mixed incontinence: Secondary | ICD-10-CM | POA: Diagnosis not present

## 2015-08-07 DIAGNOSIS — N3281 Overactive bladder: Secondary | ICD-10-CM | POA: Diagnosis not present

## 2015-08-08 DIAGNOSIS — E785 Hyperlipidemia, unspecified: Secondary | ICD-10-CM | POA: Diagnosis not present

## 2015-08-08 DIAGNOSIS — R413 Other amnesia: Secondary | ICD-10-CM | POA: Diagnosis not present

## 2015-08-08 DIAGNOSIS — E559 Vitamin D deficiency, unspecified: Secondary | ICD-10-CM | POA: Diagnosis not present

## 2015-08-08 DIAGNOSIS — R946 Abnormal results of thyroid function studies: Secondary | ICD-10-CM | POA: Diagnosis not present

## 2015-08-08 DIAGNOSIS — I10 Essential (primary) hypertension: Secondary | ICD-10-CM | POA: Diagnosis not present

## 2015-08-14 DIAGNOSIS — K219 Gastro-esophageal reflux disease without esophagitis: Secondary | ICD-10-CM | POA: Diagnosis not present

## 2015-08-14 DIAGNOSIS — C9000 Multiple myeloma not having achieved remission: Secondary | ICD-10-CM | POA: Diagnosis not present

## 2015-08-14 DIAGNOSIS — I10 Essential (primary) hypertension: Secondary | ICD-10-CM | POA: Diagnosis not present

## 2015-08-14 DIAGNOSIS — G479 Sleep disorder, unspecified: Secondary | ICD-10-CM | POA: Diagnosis not present

## 2015-08-14 DIAGNOSIS — G309 Alzheimer's disease, unspecified: Secondary | ICD-10-CM | POA: Diagnosis not present

## 2015-08-14 DIAGNOSIS — I2510 Atherosclerotic heart disease of native coronary artery without angina pectoris: Secondary | ICD-10-CM | POA: Diagnosis not present

## 2015-08-14 DIAGNOSIS — F0280 Dementia in other diseases classified elsewhere without behavioral disturbance: Secondary | ICD-10-CM | POA: Diagnosis not present

## 2015-08-14 DIAGNOSIS — F0150 Vascular dementia without behavioral disturbance: Secondary | ICD-10-CM | POA: Diagnosis not present

## 2015-08-14 DIAGNOSIS — F3289 Other specified depressive episodes: Secondary | ICD-10-CM | POA: Diagnosis not present

## 2015-08-14 DIAGNOSIS — I25709 Atherosclerosis of coronary artery bypass graft(s), unspecified, with unspecified angina pectoris: Secondary | ICD-10-CM | POA: Diagnosis not present

## 2015-08-14 DIAGNOSIS — N3281 Overactive bladder: Secondary | ICD-10-CM | POA: Diagnosis not present

## 2015-08-16 DIAGNOSIS — H53143 Visual discomfort, bilateral: Secondary | ICD-10-CM | POA: Diagnosis not present

## 2015-09-03 ENCOUNTER — Other Ambulatory Visit: Payer: Self-pay | Admitting: Obstetrics and Gynecology

## 2015-09-03 ENCOUNTER — Other Ambulatory Visit (HOSPITAL_COMMUNITY)
Admission: RE | Admit: 2015-09-03 | Discharge: 2015-09-03 | Disposition: A | Discharge status: Home / Self Care | Payer: PPO | Source: Ambulatory Visit | Attending: Obstetrics and Gynecology | Admitting: Obstetrics and Gynecology

## 2015-09-03 DIAGNOSIS — Z1151 Encounter for screening for human papillomavirus (HPV): Secondary | ICD-10-CM | POA: Diagnosis not present

## 2015-09-03 DIAGNOSIS — N870 Mild cervical dysplasia: Secondary | ICD-10-CM | POA: Diagnosis not present

## 2015-09-03 DIAGNOSIS — Z9189 Other specified personal risk factors, not elsewhere classified: Secondary | ICD-10-CM | POA: Diagnosis not present

## 2015-09-03 DIAGNOSIS — N952 Postmenopausal atrophic vaginitis: Secondary | ICD-10-CM | POA: Diagnosis not present

## 2015-09-04 LAB — CYTOLOGY - PAP

## 2015-09-12 DIAGNOSIS — N952 Postmenopausal atrophic vaginitis: Secondary | ICD-10-CM | POA: Diagnosis not present

## 2015-09-13 DIAGNOSIS — K219 Gastro-esophageal reflux disease without esophagitis: Secondary | ICD-10-CM | POA: Diagnosis not present

## 2015-09-19 DIAGNOSIS — R5383 Other fatigue: Secondary | ICD-10-CM | POA: Diagnosis not present

## 2015-09-19 DIAGNOSIS — I7300 Raynaud's syndrome without gangrene: Secondary | ICD-10-CM | POA: Diagnosis not present

## 2015-09-19 DIAGNOSIS — M353 Polymyalgia rheumatica: Secondary | ICD-10-CM | POA: Diagnosis not present

## 2015-09-19 DIAGNOSIS — J3089 Other allergic rhinitis: Secondary | ICD-10-CM | POA: Diagnosis not present

## 2015-09-19 DIAGNOSIS — M25511 Pain in right shoulder: Secondary | ICD-10-CM | POA: Diagnosis not present

## 2015-09-24 DIAGNOSIS — J301 Allergic rhinitis due to pollen: Secondary | ICD-10-CM | POA: Diagnosis not present

## 2015-09-24 DIAGNOSIS — J45909 Unspecified asthma, uncomplicated: Secondary | ICD-10-CM | POA: Insufficient documentation

## 2015-09-24 DIAGNOSIS — J4530 Mild persistent asthma, uncomplicated: Secondary | ICD-10-CM | POA: Diagnosis not present

## 2015-09-24 DIAGNOSIS — G2581 Restless legs syndrome: Secondary | ICD-10-CM | POA: Diagnosis not present

## 2015-10-03 DIAGNOSIS — Z024 Encounter for examination for driving license: Secondary | ICD-10-CM | POA: Diagnosis not present

## 2015-10-03 DIAGNOSIS — R634 Abnormal weight loss: Secondary | ICD-10-CM | POA: Insufficient documentation

## 2015-10-08 ENCOUNTER — Telehealth: Payer: Self-pay | Admitting: Oncology

## 2015-10-08 NOTE — Telephone Encounter (Signed)
Faxed pt medical records to [facility]

## 2015-10-10 DIAGNOSIS — H11823 Conjunctivochalasis, bilateral: Secondary | ICD-10-CM | POA: Diagnosis not present

## 2015-10-10 DIAGNOSIS — H04123 Dry eye syndrome of bilateral lacrimal glands: Secondary | ICD-10-CM | POA: Diagnosis not present

## 2015-10-12 DIAGNOSIS — R5383 Other fatigue: Secondary | ICD-10-CM | POA: Diagnosis not present

## 2015-10-12 DIAGNOSIS — M542 Cervicalgia: Secondary | ICD-10-CM | POA: Diagnosis not present

## 2015-10-12 DIAGNOSIS — I7300 Raynaud's syndrome without gangrene: Secondary | ICD-10-CM | POA: Diagnosis not present

## 2015-10-12 DIAGNOSIS — M353 Polymyalgia rheumatica: Secondary | ICD-10-CM | POA: Diagnosis not present

## 2015-10-12 DIAGNOSIS — M545 Low back pain: Secondary | ICD-10-CM | POA: Diagnosis not present

## 2015-10-16 DIAGNOSIS — H61001 Unspecified perichondritis of right external ear: Secondary | ICD-10-CM | POA: Diagnosis not present

## 2015-10-16 DIAGNOSIS — L720 Epidermal cyst: Secondary | ICD-10-CM | POA: Diagnosis not present

## 2015-10-18 DIAGNOSIS — J302 Other seasonal allergic rhinitis: Secondary | ICD-10-CM | POA: Diagnosis not present

## 2015-10-18 DIAGNOSIS — J029 Acute pharyngitis, unspecified: Secondary | ICD-10-CM | POA: Diagnosis not present

## 2015-10-21 ENCOUNTER — Emergency Department (HOSPITAL_BASED_OUTPATIENT_CLINIC_OR_DEPARTMENT_OTHER)
Admission: EM | Admit: 2015-10-21 | Discharge: 2015-10-21 | Disposition: A | Discharge status: Home / Self Care | Payer: PPO | Attending: Emergency Medicine | Admitting: Emergency Medicine

## 2015-10-21 ENCOUNTER — Encounter (HOSPITAL_BASED_OUTPATIENT_CLINIC_OR_DEPARTMENT_OTHER): Payer: Self-pay | Admitting: Emergency Medicine

## 2015-10-21 ENCOUNTER — Emergency Department (HOSPITAL_BASED_OUTPATIENT_CLINIC_OR_DEPARTMENT_OTHER): Payer: PPO

## 2015-10-21 DIAGNOSIS — I2510 Atherosclerotic heart disease of native coronary artery without angina pectoris: Secondary | ICD-10-CM | POA: Diagnosis not present

## 2015-10-21 DIAGNOSIS — J029 Acute pharyngitis, unspecified: Secondary | ICD-10-CM | POA: Insufficient documentation

## 2015-10-21 DIAGNOSIS — Z951 Presence of aortocoronary bypass graft: Secondary | ICD-10-CM | POA: Diagnosis not present

## 2015-10-21 DIAGNOSIS — E785 Hyperlipidemia, unspecified: Secondary | ICD-10-CM | POA: Insufficient documentation

## 2015-10-21 DIAGNOSIS — N390 Urinary tract infection, site not specified: Secondary | ICD-10-CM | POA: Diagnosis not present

## 2015-10-21 DIAGNOSIS — Z79899 Other long term (current) drug therapy: Secondary | ICD-10-CM | POA: Diagnosis not present

## 2015-10-21 DIAGNOSIS — E7800 Pure hypercholesterolemia, unspecified: Secondary | ICD-10-CM | POA: Diagnosis not present

## 2015-10-21 DIAGNOSIS — E860 Dehydration: Secondary | ICD-10-CM | POA: Diagnosis not present

## 2015-10-21 DIAGNOSIS — M1990 Unspecified osteoarthritis, unspecified site: Secondary | ICD-10-CM | POA: Insufficient documentation

## 2015-10-21 DIAGNOSIS — Z7951 Long term (current) use of inhaled steroids: Secondary | ICD-10-CM | POA: Insufficient documentation

## 2015-10-21 DIAGNOSIS — Z7982 Long term (current) use of aspirin: Secondary | ICD-10-CM | POA: Insufficient documentation

## 2015-10-21 DIAGNOSIS — J020 Streptococcal pharyngitis: Secondary | ICD-10-CM

## 2015-10-21 DIAGNOSIS — I10 Essential (primary) hypertension: Secondary | ICD-10-CM | POA: Insufficient documentation

## 2015-10-21 DIAGNOSIS — R05 Cough: Secondary | ICD-10-CM | POA: Diagnosis not present

## 2015-10-21 LAB — BASIC METABOLIC PANEL
ANION GAP: 8 (ref 5–15)
BUN: 17 mg/dL (ref 6–20)
CHLORIDE: 99 mmol/L — AB (ref 101–111)
CO2: 27 mmol/L (ref 22–32)
Calcium: 9.1 mg/dL (ref 8.9–10.3)
Creatinine, Ser: 0.88 mg/dL (ref 0.44–1.00)
GFR calc Af Amer: >60 mL/min (ref >60)
GFR, EST NON AFRICAN AMERICAN: 60 mL/min — AB (ref >60)
GLUCOSE: 86 mg/dL (ref 65–99)
POTASSIUM: 3.7 mmol/L (ref 3.5–5.1)
Sodium: 134 mmol/L — ABNORMAL LOW (ref 135–145)

## 2015-10-21 LAB — CBC WITH DIFFERENTIAL/PLATELET
BASOS ABS: 0 10*3/uL (ref 0.0–0.1)
Basophils Relative: 0 %
EOS PCT: 0 %
Eosinophils Absolute: 0 10*3/uL (ref 0.0–0.7)
HEMATOCRIT: 30.8 % — AB (ref 36.0–46.0)
Hemoglobin: 10.4 g/dL — ABNORMAL LOW (ref 12.0–15.0)
LYMPHS ABS: 0.8 10*3/uL (ref 0.7–4.0)
LYMPHS PCT: 14 %
MCH: 34 pg (ref 26.0–34.0)
MCHC: 33.8 g/dL (ref 30.0–36.0)
MCV: 100.7 fL — AB (ref 78.0–100.0)
Monocytes Absolute: 1 10*3/uL (ref 0.1–1.0)
Monocytes Relative: 17 %
NEUTROS ABS: 4 10*3/uL (ref 1.7–7.7)
Neutrophils Relative %: 69 %
PLATELETS: 210 10*3/uL (ref 150–400)
RBC: 3.06 MIL/uL — AB (ref 3.87–5.11)
RDW: 14.1 % (ref 11.5–15.5)
WBC: 5.7 10*3/uL (ref 4.0–10.5)

## 2015-10-21 LAB — TROPONIN I: Troponin I: <0.03 ng/mL (ref <0.031)

## 2015-10-21 LAB — URINALYSIS, ROUTINE W REFLEX MICROSCOPIC
Bilirubin Urine: NEGATIVE
GLUCOSE, UA: NEGATIVE mg/dL
Hgb urine dipstick: NEGATIVE
KETONES UR: NEGATIVE mg/dL
NITRITE: NEGATIVE
PH: 6 (ref 5.0–8.0)
Protein, ur: 30 mg/dL — AB
SPECIFIC GRAVITY, URINE: 1.026 (ref 1.005–1.030)

## 2015-10-21 LAB — URINE MICROSCOPIC-ADD ON: RBC / HPF: NONE SEEN RBC/hpf (ref 0–5)

## 2015-10-21 LAB — RAPID STREP SCREEN (MED CTR MEBANE ONLY): Streptococcus, Group A Screen (Direct): POSITIVE — AB

## 2015-10-21 MED ORDER — SODIUM CHLORIDE 0.9 % IV BOLUS (SEPSIS)
1000.0000 mL | Freq: Once | INTRAVENOUS | Status: AC
  Administered 2015-10-21: 1000 mL via INTRAVENOUS

## 2015-10-21 MED ORDER — CLINDAMYCIN HCL 300 MG PO CAPS: 300.0000 mg | ORAL_CAPSULE | Freq: Three times a day (TID) | ORAL | Status: DC

## 2015-10-21 MED ORDER — FOSFOMYCIN TROMETHAMINE 3 G PO PACK
3.0000 g | PACK | Freq: Once | ORAL | Status: AC
  Administered 2015-10-21: 3 g via ORAL
  Filled 2015-10-21: qty 3

## 2015-10-21 MED ORDER — CLINDAMYCIN HCL 150 MG PO CAPS
300.0000 mg | ORAL_CAPSULE | Freq: Once | ORAL | Status: AC
  Administered 2015-10-21: 300 mg via ORAL
  Filled 2015-10-21: qty 2

## 2015-10-21 NOTE — ED Notes (Signed)
Pt c/o seasonal allergy-like symptoms worse in past week.  Saw PMD on Friday for "excessive runny nose and cough".  Pt states mild chest pains with coughing and that today mucus she is expectorating is brownish in color.

## 2015-10-21 NOTE — Discharge Instructions (Signed)
Strep Throat Take the clindamycin as prescribed. Your urinary tract infection was treated with fosfomycin.  Follow up with your doctor this week for a recheck. Return to the ED if you develop new or worsening symptoms. Strep throat is a bacterial infection of the throat. Your health care provider may call the infection tonsillitis or pharyngitis, depending on whether there is swelling in the tonsils or at the back of the throat. Strep throat is most common during the cold months of the year in children who are 32-29 years of age, but it can happen during any season in people of any age. This infection is spread from person to person (contagious) through coughing, sneezing, or close contact. CAUSES Strep throat is caused by the bacteria called Streptococcus pyogenes. RISK FACTORS This condition is more likely to develop in:  People who spend time in crowded places where the infection can spread easily.  People who have close contact with someone who has strep throat. SYMPTOMS Symptoms of this condition include:  Fever or chills.   Redness, swelling, or pain in the tonsils or throat.  Pain or difficulty when swallowing.  White or yellow spots on the tonsils or throat.  Swollen, tender glands in the neck or under the jaw.  Red rash all over the body (rare). DIAGNOSIS This condition is diagnosed by performing a rapid strep test or by taking a swab of your throat (throat culture test). Results from a rapid strep test are usually ready in a few minutes, but throat culture test results are available after one or two days. TREATMENT This condition is treated with antibiotic medicine. HOME CARE INSTRUCTIONS Medicines  Take over-the-counter and prescription medicines only as told by your health care provider.  Take your antibiotic as told by your health care provider. Do not stop taking the antibiotic even if you start to feel better.  Have family members who also have a sore throat or  fever tested for strep throat. They may need antibiotics if they have the strep infection. Eating and Drinking  Do not share food, drinking cups, or personal items that could cause the infection to spread to other people.  If swallowing is difficult, try eating soft foods until your sore throat feels better.  Drink enough fluid to keep your urine clear or pale yellow. General Instructions  Gargle with a salt-water mixture 3-4 times per day or as needed. To make a salt-water mixture, completely dissolve -1 tsp of salt in 1 cup of warm water.  Make sure that all household members wash their hands well.  Get plenty of rest.  Stay home from school or work until you have been taking antibiotics for 24 hours.  Keep all follow-up visits as told by your health care provider. This is important. SEEK MEDICAL CARE IF:  The glands in your neck continue to get bigger.  You develop a rash, cough, or earache.  You cough up a thick liquid that is green, yellow-brown, or bloody.  You have pain or discomfort that does not get better with medicine.  Your problems seem to be getting worse rather than better.  You have a fever. SEEK IMMEDIATE MEDICAL CARE IF:  You have new symptoms, such as vomiting, severe headache, stiff or painful neck, chest pain, or shortness of breath.  You have severe throat pain, drooling, or changes in your voice.  You have swelling of the neck, or the skin on the neck becomes red and tender.  You have signs of dehydration,  such as fatigue, dry mouth, and decreased urination.  You become increasingly sleepy, or you cannot wake up completely.  Your joints become red or painful.   This information is not intended to replace advice given to you by your health care provider. Make sure you discuss any questions you have with your health care provider.   Document Released: 05/23/2000 Document Revised: 02/14/2015 Document Reviewed: 09/18/2014 Elsevier Interactive  Patient Education [employer].

## 2015-10-21 NOTE — ED Notes (Signed)
PA at bedside.

## 2015-10-21 NOTE — ED Notes (Signed)
Pt given apple juice  

## 2015-10-21 NOTE — ED Notes (Addendum)
Pt with "seasonal allergy" symptoms lasting several days. Pt was seen by PCP Friday and given "shot" in her hip. Pt states she has been cough up dark brown mucus. Pt also reports headache, runny nose and sore throat. Denies fevers, also reports fatigue.

## 2015-10-21 NOTE — ED Provider Notes (Signed)
CSN: [id number]     Arrival date & time 10/21/15  1138 History   First MD Initiated Contact with Patient 10/21/15 1334     Chief Complaint  Patient presents with  . Cough     (Consider location/radiation/quality/duration/timing/severity/associated sxs/prior Treatment) HPI Comments: Patient reports not feeling well for several weeks. She saw a nurse practitioner at her assisted living facility 2 days ago with runny nose and cough and was told it was due to allergies. Her cough improved yesterday today and became worse today with brown mucus. She also has had headache, sore throat, runny nose and lots of nasal congestion. Denies fever. She has some chest pain with coughing but this is not similar to her cardiac type pain. There is no exertional or pleuritic pain. She did receive a flu shot. denies any sick contacts. She endorses a poor appetite but has been drinking fluids and eating some food. Denies any falls. Patient with history of coronary disease status post bypass, hypercholesterolemia, hyperlipidemia, polymyalgia rheumatica on chronic prednisone, multiple myeloma in remission.  The history is provided by the patient.    Past Medical History  Diagnosis Date  . Hx of CABG   . Hypertension   . GERD (gastroesophageal reflux disease)   . Anxiety   . Coronary artery disease   . Arthritis   . Anginal pain ([place])   . Shortness of breath   . Intermediate coronary syndrome ([place])   . Raynauds syndrome   . Coronary atherosclerosis of unspecified type of vessel, native or graft   . Coronary atherosclerosis of autologous vein bypass graft   . Spinal stenosis of lumbar region   . Hypercholesteremia   . Hyperlipidemia     GOAL LDL <70  . Panic disorder   . Osteopenia   . Multiple myeloma ([facility])     Dr. Benay Spice  . Mild chronic anemia   . Urine test positive for microalbuminuria 12/2011  . Ovarian cyst, bilateral     Rt complex  . Allergic rhinitis   . Atypical chest pain 04/25/13   Doing better, does occasionally respond to Nitroglycerin  . Overactive bladder   . Incontinent of urine    Past Surgical History  Procedure Laterality Date  . Tonsillectomy and adenoidectomy    . Dilation and curettage of uterus    . Eye surgery      both cataracts  . Colonoscopy    . Upper gastrointestinal endoscopy    . Capsulotomy Right 08/04/2012    Procedure: CAPSULOTOMY;  Surgeon: Colin Rhein, MD;  Location: [facility];  Service: Orthopedics;  Laterality: Right;  RIGHT 2ND TOE METATARSOPHALANGEAL JOINT DORSAL CAPSULOTOMY   . Bunionectomy with hammertoe reconstruction Right 08/04/2012    Procedure:  HAMMERTOE RECONSTRUCTION;  Surgeon: Colin Rhein, MD;  Location: [place];  Service: Orthopedics;  Laterality: Right;  HAMMER TOE RECONSTRUCTION PROBABLE FLEXOR DIGITORUM LONGUS TO PROXIMAL PHALANX TRANSFER LATERALIZED   . Coronary artery bypass graft  2007    LIMA to LAD, SVG to RCA  non-ST segment MI 08/2009 (Dr. Tamala Julian)  . Hammer toe surgery Right 08/2012  . Cataract extraction, bilateral    . Coronary angioplasty with stent placement  2014    2 stents  . Left heart catheterization with coronary/graft angiogram N/A 07/09/2012    Procedure: LEFT HEART CATHETERIZATION WITH Beatrix Fetters;  Surgeon: Sinclair Grooms, MD;  Location: [facility] CATH LAB;  Service: Cardiovascular;  Laterality: N/A;   Family History  Problem Relation Age of Onset  . Heart attack Mother   . CAD Mother   . Obesity Sister   . Cancer Paternal Aunt     breast   Social History  Substance Use Topics  . Smoking status: Never Smoker   . Smokeless tobacco: Never Used  . Alcohol Use: Yes     Comment: occasional   OB History    No data available     Review of Systems  Constitutional: Positive for chills, activity change, appetite change and fatigue. Negative for fever.  HENT: Positive for congestion, postnasal drip, rhinorrhea and sore throat. Negative for trouble  swallowing.   Eyes: Negative for visual disturbance.  Respiratory: Positive for cough and chest tightness. Negative for shortness of breath.   Cardiovascular: Negative for chest pain.  Gastrointestinal: Negative for nausea, vomiting and abdominal pain.  Genitourinary: Negative for vaginal bleeding and vaginal discharge.  Musculoskeletal: Positive for myalgias and arthralgias.  Skin: Negative for wound.  Neurological: Positive for weakness. Negative for dizziness, light-headedness and headaches.  A complete 10 system review of systems was obtained and all systems are negative except as noted in the HPI and PMH.      Allergies  Cephalosporins; Codeine; Levaquin; Macrodantin; Morphine and related; Trimethoprim; Cilostazol; Dexilant; Erythromycin; Nitrofurantoin; Simvastatin; Sulfa antibiotics; Vesicare; Butazolidin; Food; Ibuprofen; Penicillins; Toviaz; and Voltaren  Home Medications   Prior to Admission medications   Medication Sig Start Date End Date Taking? Authorizing Provider  acetaminophen (TYLENOL) 500 MG tablet Take 1,000 mg by mouth 2 (two) times daily.   Yes Historical Provider, MD  Albuterol Sulfate (PROAIR RESPICLICK IN) Inhale 2 puffs into the lungs 4 (four) times daily.   Yes Historical Provider, MD  aspirin 325 MG tablet Take 325 mg by mouth daily.   Yes Historical Provider, MD  Biotin 5000 MCG CAPS Take 5,000 mcg by mouth every evening.    Yes Historical Provider, MD  calcium carbonate (OS-CAL) 600 MG TABS tablet Take 600 mg by mouth daily with breakfast.   Yes Historical Provider, MD  Coenzyme Q10 (COQ-10) 100 MG CAPS Take 1 capsule by mouth at bedtime.   Yes Historical Provider, MD  donepezil (ARICEPT) 10 MG tablet Take 10 mg by mouth at bedtime.   Yes Historical Provider, MD  famotidine (PEPCID) 40 MG tablet Take 40 mg by mouth at bedtime.   Yes Historical Provider, MD  isosorbide mononitrate (IMDUR) 60 MG 24 hr tablet Take 1 tablet (60 mg total) by mouth daily. 06/14/15   Yes Almyra Deforest, PA  LORazepam (ATIVAN) 0.5 MG tablet Take 0.5 mg by mouth at bedtime as needed for sleep.    Yes Historical Provider, MD  metoprolol tartrate (LOPRESSOR) 25 MG tablet Take 1 tablet (25 mg total) by mouth 2 (two) times daily. 06/14/15  Yes Almyra Deforest, PA  mirabegron ER (MYRBETRIQ) 25 MG TB24 tablet Take 25 mg by mouth daily.   Yes Historical Provider, MD  montelukast (SINGULAIR) 10 MG tablet Take 10 mg by mouth at bedtime.    Yes Historical Provider, MD  Multiple Vitamin (MULITIVITAMIN WITH MINERALS) TABS Take 1 tablet by mouth daily.   Yes Historical Provider, MD  nitroGLYCERIN (NITROSTAT) 0.4 MG SL tablet Place 0.4 mg under the tongue every 5 (five) minutes as needed for chest pain.   Yes Historical Provider, MD  Polyethyl Glycol-Propyl Glycol (SYSTANE OP) Apply 1 drop to eye daily as needed (for dry eyes).   Yes Historical Provider, MD  polyethylene glycol (MIRALAX / GLYCOLAX)  packet Take 17 g by mouth at bedtime.   Yes Historical Provider, MD  predniSONE (DELTASONE) 5 MG tablet Take 5 mg by mouth daily with breakfast.    Yes Historical Provider, MD  rosuvastatin (CRESTOR) 5 MG tablet Take 1 tablet (5 mg total) by mouth daily at 6 PM. 06/14/15  Yes Almyra Deforest, PA  sertraline (ZOLOFT) 25 MG tablet Take 50 mg by mouth at bedtime.    Yes Historical Provider, MD  clindamycin (CLEOCIN) 300 MG capsule Take 1 capsule (300 mg total) by mouth 3 (three) times daily. 10/21/15   Ezequiel Essex, MD  Hypertonic Nasal Wash (SINUS RINSE NA) Place 1 application into the nose daily.    Historical Provider, MD  NON FORMULARY Take 30 mLs by mouth daily. Braggs Apple Cider Vinegar in 6 oz water (mixed with honey)    Historical Provider, MD  UNABLE TO FIND Inject into the skin once a week. Med Name: [facility] to right ankle (lasts 30 minutes)    Historical Provider, MD   BP 144/46 mmHg  Pulse 55  Temp(Src) 98.1 F (36.7 C) (Oral)  Resp 18  Ht '5\' 3"'  (1.6 m)  Wt 103 lb (46.72 kg)   BMI 18.25 kg/m2  SpO2 97% Physical Exam  Constitutional: She is oriented to person, place, and time. She appears well-developed and well-nourished. No distress.  Fatigued appearing  HENT:  Head: Normocephalic and atraumatic.  Mouth/Throat: Oropharynx is clear and moist. No oropharyngeal exudate.  Moist mucus membranes OP clear, no exudate, no asymmetry   Eyes: Conjunctivae and EOM are normal. Pupils are equal, round, and reactive to light.  Neck: Normal range of motion. Neck supple.  No meningismus.  Cardiovascular: Normal rate, regular rhythm, normal heart sounds and intact distal pulses.   No murmur heard. Pulmonary/Chest: Effort normal and breath sounds normal. No respiratory distress.  Well healed sternotomy  Abdominal: Soft. There is no tenderness. There is no rebound and no guarding.  Musculoskeletal: Normal range of motion. She exhibits no edema or tenderness.  Neurological: She is alert and oriented to person, place, and time. No cranial nerve deficit. She exhibits normal muscle tone. Coordination normal.  No ataxia on finger to nose bilaterally. No pronator drift. 5/5 strength throughout. CN 2-12 intact.Equal grip strength. Sensation intact.   Skin: Skin is warm.  Psychiatric: She has a normal mood and affect. Her behavior is normal.  Nursing note and vitals reviewed.   ED Course  Procedures (including critical care time) Labs Review Labs Reviewed  RAPID STREP SCREEN (NOT AT [facility]) - Abnormal; Notable for the following:    Streptococcus, Group A Screen (Direct) POSITIVE (*)    All other components within normal limits  CBC WITH DIFFERENTIAL/PLATELET - Abnormal; Notable for the following:    RBC 3.06 (*)    Hemoglobin 10.4 (*)    HCT 30.8 (*)    MCV 100.7 (*)    All other components within normal limits  BASIC METABOLIC PANEL - Abnormal; Notable for the following:    Sodium 134 (*)    Chloride 99 (*)    GFR calc non Af Amer 60 (*)    All other components within  normal limits  URINALYSIS, ROUTINE W REFLEX MICROSCOPIC (NOT AT [facility]) - Abnormal; Notable for the following:    Color, Urine AMBER (*)    Protein, ur 30 (*)    Leukocytes, UA MODERATE (*)    All other components within normal limits  URINE  MICROSCOPIC-ADD ON - Abnormal; Notable for the following:    Squamous Epithelial / LPF 6-30 (*)    Bacteria, UA MANY (*)    Casts HYALINE CASTS (*)    All other components within normal limits  CULTURE, BLOOD (ROUTINE X 2)  CULTURE, BLOOD (ROUTINE X 2)  URINE CULTURE  TROPONIN I    Imaging Review Dg Chest 2 View  10/21/2015  CLINICAL DATA:  Sternal chest pain with cough two days. EXAM: CHEST  2 VIEW COMPARISON:  06/13/2015 and 06/02/2015 FINDINGS: Sternotomy wires unchanged. Lungs are adequately inflated without consolidation or effusion. Cardiomediastinal silhouette is within normal. There is calcified plaque over the aortic arch. There mild degenerate changes of the spine. IMPRESSION: No active cardiopulmonary disease. Electronically Signed   By: Marin Olp M.D.   On: 10/21/2015 13:10   I have personally reviewed and evaluated these images and lab results as part of my medical decision-making.   EKG Interpretation   Date/Time:  Sunday Oct 21 2015 14:14:35 EDT Ventricular Rate:  49 PR Interval:  172 QRS Duration: 86 QT Interval:  453 QTC Calculation: 409 R Axis:   30 Text Interpretation:  Sinus bradycardia Borderline T wave abnormalities No  significant change was found Confirmed by Wyvonnia Dusky  MD, Matina Rodier [phone]) on  10/21/2015 2:35:25 PM      MDM   Final diagnoses:  Strep pharyngitis  UTI (lower urinary tract infection)  Dehydration  Three-day history of cough, congestion, rhinorrhea and sore throat and body aches. Chills but no fever. Increased fatigue. Some chest pain with coughing that is dissimilar to her cardiac type pain.  EKG is unchanged with sinus bradycardia. Orthostatics are positive by blood pressure. IVF and PO fluids  given. CXR negative for infiltrate.  Labs at baseline. Hemoglobin stable. Troponin negative. Low suspicion for ACS. Chest pain only with coughing, not similar to previous angina.  Rapid strep positive.  Patient has many antibiotic allergies including penicillin. Will treat with clindamycin.  UA also appears possibly infected. Culture sent.  Given her many allergies, fosfomycin given in the ED.  Patient is tolerating PO and ambulatory and feels improved.  Continue PO hydration at home, followup with PCP this week for recheck. Return precautions discussed.  Ezequiel Essex, MD 10/22/15 [phone]

## 2015-10-21 NOTE — ED Notes (Signed)
Pt would like to speak to the provider regarding the rx. PA notified.

## 2015-10-23 LAB — URINE CULTURE

## 2015-10-24 DIAGNOSIS — R001 Bradycardia, unspecified: Secondary | ICD-10-CM | POA: Diagnosis not present

## 2015-10-24 DIAGNOSIS — R05 Cough: Secondary | ICD-10-CM | POA: Diagnosis not present

## 2015-10-24 DIAGNOSIS — I10 Essential (primary) hypertension: Secondary | ICD-10-CM | POA: Diagnosis not present

## 2015-10-27 LAB — CULTURE, BLOOD (ROUTINE X 2)
CULTURE: NO GROWTH
CULTURE: NO GROWTH

## 2015-10-28 ENCOUNTER — Emergency Department (HOSPITAL_COMMUNITY)
Admission: EM | Admit: 2015-10-28 | Discharge: 2015-10-28 | Disposition: A | Discharge status: Home / Self Care | Payer: PPO | Attending: Emergency Medicine | Admitting: Emergency Medicine

## 2015-10-28 ENCOUNTER — Encounter (HOSPITAL_COMMUNITY): Payer: Self-pay | Admitting: Emergency Medicine

## 2015-10-28 ENCOUNTER — Emergency Department (HOSPITAL_COMMUNITY): Payer: PPO

## 2015-10-28 DIAGNOSIS — R404 Transient alteration of awareness: Secondary | ICD-10-CM | POA: Diagnosis not present

## 2015-10-28 DIAGNOSIS — I2510 Atherosclerotic heart disease of native coronary artery without angina pectoris: Secondary | ICD-10-CM | POA: Diagnosis not present

## 2015-10-28 DIAGNOSIS — Z8579 Personal history of other malignant neoplasms of lymphoid, hematopoietic and related tissues: Secondary | ICD-10-CM | POA: Diagnosis not present

## 2015-10-28 DIAGNOSIS — Z862 Personal history of diseases of the blood and blood-forming organs and certain disorders involving the immune mechanism: Secondary | ICD-10-CM | POA: Diagnosis not present

## 2015-10-28 DIAGNOSIS — E7800 Pure hypercholesterolemia, unspecified: Secondary | ICD-10-CM | POA: Insufficient documentation

## 2015-10-28 DIAGNOSIS — M1990 Unspecified osteoarthritis, unspecified site: Secondary | ICD-10-CM | POA: Diagnosis not present

## 2015-10-28 DIAGNOSIS — Z87448 Personal history of other diseases of urinary system: Secondary | ICD-10-CM | POA: Diagnosis not present

## 2015-10-28 DIAGNOSIS — Z8742 Personal history of other diseases of the female genital tract: Secondary | ICD-10-CM | POA: Insufficient documentation

## 2015-10-28 DIAGNOSIS — Z880 Allergy status to penicillin: Secondary | ICD-10-CM | POA: Diagnosis not present

## 2015-10-28 DIAGNOSIS — M545 Low back pain: Secondary | ICD-10-CM | POA: Diagnosis not present

## 2015-10-28 DIAGNOSIS — R55 Syncope and collapse: Secondary | ICD-10-CM | POA: Diagnosis not present

## 2015-10-28 DIAGNOSIS — R001 Bradycardia, unspecified: Secondary | ICD-10-CM | POA: Diagnosis not present

## 2015-10-28 DIAGNOSIS — E785 Hyperlipidemia, unspecified: Secondary | ICD-10-CM | POA: Insufficient documentation

## 2015-10-28 DIAGNOSIS — F410 Panic disorder [episodic paroxysmal anxiety] without agoraphobia: Secondary | ICD-10-CM | POA: Diagnosis not present

## 2015-10-28 DIAGNOSIS — Z79899 Other long term (current) drug therapy: Secondary | ICD-10-CM | POA: Diagnosis not present

## 2015-10-28 DIAGNOSIS — Z8719 Personal history of other diseases of the digestive system: Secondary | ICD-10-CM | POA: Insufficient documentation

## 2015-10-28 DIAGNOSIS — S3992XA Unspecified injury of lower back, initial encounter: Secondary | ICD-10-CM | POA: Diagnosis not present

## 2015-10-28 DIAGNOSIS — I10 Essential (primary) hypertension: Secondary | ICD-10-CM | POA: Diagnosis not present

## 2015-10-28 DIAGNOSIS — M533 Sacrococcygeal disorders, not elsewhere classified: Secondary | ICD-10-CM | POA: Diagnosis not present

## 2015-10-28 DIAGNOSIS — Z7982 Long term (current) use of aspirin: Secondary | ICD-10-CM | POA: Diagnosis not present

## 2015-10-28 DIAGNOSIS — S0990XA Unspecified injury of head, initial encounter: Secondary | ICD-10-CM | POA: Diagnosis not present

## 2015-10-28 DIAGNOSIS — R51 Headache: Secondary | ICD-10-CM | POA: Diagnosis not present

## 2015-10-28 LAB — CBC WITH DIFFERENTIAL/PLATELET
BASOS ABS: 0 10*3/uL (ref 0.0–0.1)
BASOS PCT: 0 %
Eosinophils Absolute: 0 10*3/uL (ref 0.0–0.7)
Eosinophils Relative: 1 %
HCT: 34.3 % — ABNORMAL LOW (ref 36.0–46.0)
Hemoglobin: 11.2 g/dL — ABNORMAL LOW (ref 12.0–15.0)
LYMPHS PCT: 22 %
Lymphs Abs: 1.1 10*3/uL (ref 0.7–4.0)
MCH: 32 pg (ref 26.0–34.0)
MCHC: 32.7 g/dL (ref 30.0–36.0)
MCV: 98 fL (ref 78.0–100.0)
MONO ABS: 0.4 10*3/uL (ref 0.1–1.0)
Monocytes Relative: 8 %
Neutro Abs: 3.6 10*3/uL (ref 1.7–7.7)
Neutrophils Relative %: 69 %
PLATELETS: 286 10*3/uL (ref 150–400)
RBC: 3.5 MIL/uL — AB (ref 3.87–5.11)
RDW: 13.8 % (ref 11.5–15.5)
WBC: 5.2 10*3/uL (ref 4.0–10.5)

## 2015-10-28 LAB — BASIC METABOLIC PANEL
Anion gap: 10 (ref 5–15)
BUN: 13 mg/dL (ref 6–20)
CALCIUM: 9.4 mg/dL (ref 8.9–10.3)
CO2: 25 mmol/L (ref 22–32)
Chloride: 99 mmol/L — ABNORMAL LOW (ref 101–111)
Creatinine, Ser: 0.9 mg/dL (ref 0.44–1.00)
GFR calc Af Amer: >60 mL/min (ref >60)
GFR, EST NON AFRICAN AMERICAN: 58 mL/min — AB (ref >60)
GLUCOSE: 126 mg/dL — AB (ref 65–99)
POTASSIUM: 3.4 mmol/L — AB (ref 3.5–5.1)
SODIUM: 134 mmol/L — AB (ref 135–145)

## 2015-10-28 MED ORDER — SODIUM CHLORIDE 0.9 % IV BOLUS (SEPSIS)
1000.0000 mL | Freq: Once | INTRAVENOUS | Status: AC
  Administered 2015-10-28: 1000 mL via INTRAVENOUS

## 2015-10-28 MED ORDER — ACETAMINOPHEN 325 MG PO TABS
650.0000 mg | ORAL_TABLET | Freq: Once | ORAL | Status: AC
  Administered 2015-10-28: 650 mg via ORAL
  Filled 2015-10-28: qty 2

## 2015-10-28 NOTE — ED Provider Notes (Signed)
CSN: [id number]     Arrival date & time 10/28/15  1020 History   First MD Initiated Contact with Patient 10/28/15 1022     Chief Complaint  Patient presents with  . Loss of Consciousness     (Consider location/radiation/quality/duration/timing/severity/associated sxs/prior Treatment) HPI  82 year old female presents with syncope. She states she's been feeling ill for about 1 week. Was here 1 week ago and diagnosed with strep and discharged with clindamycin. Has since had 2 loose stools per day. Has been feeling cold. Increasing fatigue. She changed the clindamycin from TID to BID but still has loose stools. No fevers. Only slept 2 hours last night. Since 6 Am when she woke up she's been weak and tired. After going to the bathroom she felt dizzy when standing and ended up passing out while walking back. Hit her occipital head. Nurse helped her up and then she passed out again. Currently feels tired, weak and cold. No palpitations. No chest/abd pain. No dyspnea. No headache or focal weakness.   Past Medical History  Diagnosis Date  . Hx of CABG   . Hypertension   . GERD (gastroesophageal reflux disease)   . Anxiety   . Coronary artery disease   . Arthritis   . Anginal pain ([place])   . Shortness of breath   . Intermediate coronary syndrome ([place])   . Raynauds syndrome   . Coronary atherosclerosis of unspecified type of vessel, native or graft   . Coronary atherosclerosis of autologous vein bypass graft   . Spinal stenosis of lumbar region   . Hypercholesteremia   . Hyperlipidemia     GOAL LDL <70  . Panic disorder   . Osteopenia   . Multiple myeloma ([facility])     Dr. Benay Spice  . Mild chronic anemia   . Urine test positive for microalbuminuria 12/2011  . Ovarian cyst, bilateral     Rt complex  . Allergic rhinitis   . Atypical chest pain 04/25/13    Doing better, does occasionally respond to Nitroglycerin  . Overactive bladder   . Incontinent of urine    Past Surgical History    Procedure Laterality Date  . Tonsillectomy and adenoidectomy    . Dilation and curettage of uterus    . Eye surgery      both cataracts  . Colonoscopy    . Upper gastrointestinal endoscopy    . Capsulotomy Right 08/04/2012    Procedure: CAPSULOTOMY;  Surgeon: Colin Rhein, MD;  Location: [facility];  Service: Orthopedics;  Laterality: Right;  RIGHT 2ND TOE METATARSOPHALANGEAL JOINT DORSAL CAPSULOTOMY   . Bunionectomy with hammertoe reconstruction Right 08/04/2012    Procedure:  HAMMERTOE RECONSTRUCTION;  Surgeon: Colin Rhein, MD;  Location: [facility];  Service: Orthopedics;  Laterality: Right;  HAMMER TOE RECONSTRUCTION PROBABLE FLEXOR DIGITORUM LONGUS TO PROXIMAL PHALANX TRANSFER LATERALIZED   . Coronary artery bypass graft  2007    LIMA to LAD, SVG to RCA  non-ST segment MI 08/2009 (Dr. Tamala Julian)  . Hammer toe surgery Right 08/2012  . Cataract extraction, bilateral    . Coronary angioplasty with stent placement  2014    2 stents  . Left heart catheterization with coronary/graft angiogram N/A 07/09/2012    Procedure: LEFT HEART CATHETERIZATION WITH Beatrix Fetters;  Surgeon: Sinclair Grooms, MD;  Location: [facility] CATH LAB;  Service: Cardiovascular;  Laterality: N/A;   Family History  Problem Relation Age of Onset  . Heart attack Mother   .  CAD Mother   . Obesity Sister   . Cancer Paternal Aunt     breast   Social History  Substance Use Topics  . Smoking status: Never Smoker   . Smokeless tobacco: Never Used  . Alcohol Use: Yes     Comment: occasional   OB History    No data available     Review of Systems  Constitutional: Positive for fatigue.       Feels cold  Respiratory: Negative for shortness of breath.   Cardiovascular: Negative for chest pain.  Gastrointestinal: Positive for diarrhea. Negative for vomiting and abdominal pain.  Genitourinary: Negative for dysuria.  Neurological: Positive for syncope, weakness and  light-headedness. Negative for headaches.  All other systems reviewed and are negative.     Allergies  Cephalosporins; Codeine; Levaquin; Macrodantin; Morphine and related; Trimethoprim; Cilostazol; Dexilant; Erythromycin; Nitrofurantoin; Simvastatin; Sulfa antibiotics; Vesicare; Butazolidin; Food; Ibuprofen; Penicillins; Toviaz; and Voltaren  Home Medications   Prior to Admission medications   Medication Sig Start Date End Date Taking? Authorizing Provider  acetaminophen (TYLENOL) 500 MG tablet Take 1,000 mg by mouth 2 (two) times daily.    Historical Provider, MD  Albuterol Sulfate (PROAIR RESPICLICK IN) Inhale 2 puffs into the lungs 4 (four) times daily.    Historical Provider, MD  aspirin 325 MG tablet Take 325 mg by mouth daily.    Historical Provider, MD  Biotin 5000 MCG CAPS Take 5,000 mcg by mouth every evening.     Historical Provider, MD  calcium carbonate (OS-CAL) 600 MG TABS tablet Take 600 mg by mouth daily with breakfast.    Historical Provider, MD  clindamycin (CLEOCIN) 300 MG capsule Take 1 capsule (300 mg total) by mouth 3 (three) times daily. 10/21/15   Ezequiel Essex, MD  Coenzyme Q10 (COQ-10) 100 MG CAPS Take 1 capsule by mouth at bedtime.    Historical Provider, MD  donepezil (ARICEPT) 10 MG tablet Take 10 mg by mouth at bedtime.    Historical Provider, MD  famotidine (PEPCID) 40 MG tablet Take 40 mg by mouth at bedtime.    Historical Provider, MD  Hypertonic Nasal Wash (SINUS RINSE NA) Place 1 application into the nose daily.    Historical Provider, MD  isosorbide mononitrate (IMDUR) 60 MG 24 hr tablet Take 1 tablet (60 mg total) by mouth daily. 06/14/15   Almyra Deforest, PA  LORazepam (ATIVAN) 0.5 MG tablet Take 0.5 mg by mouth at bedtime as needed for sleep.     Historical Provider, MD  metoprolol tartrate (LOPRESSOR) 25 MG tablet Take 1 tablet (25 mg total) by mouth 2 (two) times daily. 06/14/15   Almyra Deforest, PA  mirabegron ER (MYRBETRIQ) 25 MG TB24 tablet Take 25 mg by mouth  daily.    Historical Provider, MD  montelukast (SINGULAIR) 10 MG tablet Take 10 mg by mouth at bedtime.     Historical Provider, MD  Multiple Vitamin (MULITIVITAMIN WITH MINERALS) TABS Take 1 tablet by mouth daily.    Historical Provider, MD  nitroGLYCERIN (NITROSTAT) 0.4 MG SL tablet Place 0.4 mg under the tongue every 5 (five) minutes as needed for chest pain.    Historical Provider, MD  NON FORMULARY Take 30 mLs by mouth daily. [place] Apple Cider Vinegar in 6 oz water (mixed with honey)    Historical Provider, MD  Polyethyl Glycol-Propyl Glycol (SYSTANE OP) Apply 1 drop to eye daily as needed (for dry eyes).    Historical Provider, MD  polyethylene glycol (MIRALAX / GLYCOLAX) packet Take  17 g by mouth at bedtime.    Historical Provider, MD  predniSONE (DELTASONE) 5 MG tablet Take 5 mg by mouth daily with breakfast.     Historical Provider, MD  rosuvastatin (CRESTOR) 5 MG tablet Take 1 tablet (5 mg total) by mouth daily at 6 PM. 06/14/15   Almyra Deforest, PA  sertraline (ZOLOFT) 25 MG tablet Take 50 mg by mouth at bedtime.     Historical Provider, MD  UNABLE TO FIND Inject into the skin once a week. Med Name: [facility] weeks to right ankle (lasts 30 minutes)    Historical Provider, MD   There were no vitals taken for this visit. Physical Exam  Constitutional: She is oriented to person, place, and time. She appears well-developed and well-nourished.  Appears tired but is awake and alert  HENT:  Head: Normocephalic and atraumatic.  Right Ear: External ear normal.  Left Ear: External ear normal.  Nose: Nose normal.  Mouth/Throat: Oropharynx is clear and moist.  No occipital swelling or tenderness  Eyes: EOM are normal. Pupils are equal, round, and reactive to light. Right eye exhibits no discharge. Left eye exhibits no discharge.  Neck: Normal range of motion. Neck supple.  Cardiovascular: Regular rhythm and normal heart sounds.  Bradycardia present.   HR in 50s    Pulmonary/Chest: Effort normal and breath sounds normal.  Abdominal: Soft. She exhibits no distension. There is no tenderness.  Musculoskeletal:       Lumbar back: She exhibits tenderness (mild, over sacrum).       Back:  Neurological: She is alert and oriented to person, place, and time.  CN 2-12 grossly intact. 5/5 strength in all 4 extremities. Normal finger to nose. Normal gross sensation  Skin: Skin is warm and dry.  Nursing note and vitals reviewed.   ED Course  Procedures (including critical care time) Labs Review Labs Reviewed  BASIC METABOLIC PANEL - Abnormal; Notable for the following:    Sodium 134 (*)    Potassium 3.4 (*)    Chloride 99 (*)    Glucose, Bld 126 (*)    GFR calc non Af Amer 58 (*)    All other components within normal limits  CBC WITH DIFFERENTIAL/PLATELET - Abnormal; Notable for the following:    RBC 3.50 (*)    Hemoglobin 11.2 (*)    HCT 34.3 (*)    All other components within normal limits    Imaging Review Dg Sacrum/coccyx  10/28/2015  CLINICAL DATA:  Fall, severe tailbone pain EXAM: SACRUM AND COCCYX - 2+ VIEW COMPARISON:  None. FINDINGS: No displaced sacrococcygeal fracture is seen. Visualized bony pelvis appears intact. Surgical clips overlying the left groin. IMPRESSION: No displaced sacrococcygeal fracture is seen. Electronically Signed   By: Julian Hy M.D.   On: 10/28/2015 13:48   Ct Head Wo Contrast  10/28/2015  CLINICAL DATA:  Pain after fall. EXAM: CT HEAD WITHOUT CONTRAST TECHNIQUE: Contiguous axial images were obtained from the base of the skull through the vertex without intravenous contrast. COMPARISON:  MRI October 06, 2014 FINDINGS: Air-fluid levels are seen in the maxillary sinuses with scattered opacification of ethmoid air cells. The mastoid air cells and middle ears are well aerated. No fractures, bony lesions, or extracranial soft tissue abnormalities. No subdural, epidural, or subarachnoid hemorrhage. The cerebellum and  brainstem are normal. No mass, mass effect, or midline shift. A chronic infarct in the right frontal lobe is identified, stable in the interval. No acute  cortical ischemia or infarct identified. Scattered white matter changes are seen, particularly posteriorly. No other acute abnormalities. IMPRESSION: 1. No bleed or acute ischemia/infarct. 2. Sinus disease as described above with fluid in the maxillary sinuses. 3. Chronic right frontal infarct. Electronically Signed   By: Dorise Bullion III M.D   On: 10/28/2015 11:56   I have personally reviewed and evaluated these images and lab results as part of my medical decision-making.   EKG Interpretation   Date/Time:  Sunday Oct 28 2015 10:34:34 EDT Ventricular Rate:  59 PR Interval:  160 QRS Duration: 97 QT Interval:  456 QTC Calculation: 452 R Axis:   57 Text Interpretation:  Sinus bradycardia Borderline ST depression,  anterolateral leads no significant change since Oct 21 2015 Confirmed by  Regenia Skeeter MD, Karnell Vanderloop [phone]) on 10/28/2015 10:40:08 AM      MDM   Final diagnoses:  Syncope, unspecified syncope type    Patient syncope is likely related to dehydration and poor sleep. She has been feeling dizzy all morning. I have very low suspicion for an arrhythmia causing her symptoms. Her neuro exam is unremarkable and she was able to get up and walk after IV fluids. She was orthostatic prior to IV fluids. She did not feel like she's got a pass out however. Lab work is unremarkable. CT head obtained given that she fell and injured her posterior head. Coccyx and sacrum x-ray also negative. Patient feels better area did diarrhea is likely a side effect of the clindamycin. However she is not having high volume diarrhea and so I doubt C. difficile. No abdominal tenderness or pain. Family and patient are comfortable with discharge and since I have a low suspicion for serious pathology of this is warranted. Given follow-up precautions and discussed return per  cautions.    Sherwood Gambler, MD 10/28/15 1650

## 2015-10-28 NOTE — ED Notes (Signed)
Per EMS- pt here from [facility]. Pt taking antibiotics and prednisone for recent UTI/bronchitis/strep. Pt was washing her hands after using bathroom when she fell and hit her head. Denies other injuries. Pt reports feeling weak, tired and dizzy for one week. CBG 93. 20G PIV LAC. Pt not on blood thinners but does take asprin.

## 2015-10-28 NOTE — ED Notes (Signed)
Pt is in stable condition upon d/c and is escorted from ED via wheelchair. 

## 2015-10-29 DIAGNOSIS — S0003XD Contusion of scalp, subsequent encounter: Secondary | ICD-10-CM | POA: Diagnosis not present

## 2015-11-01 DIAGNOSIS — M545 Low back pain: Secondary | ICD-10-CM | POA: Diagnosis not present

## 2015-11-01 DIAGNOSIS — R5383 Other fatigue: Secondary | ICD-10-CM | POA: Diagnosis not present

## 2015-11-01 DIAGNOSIS — I7300 Raynaud's syndrome without gangrene: Secondary | ICD-10-CM | POA: Diagnosis not present

## 2015-11-06 ENCOUNTER — Other Ambulatory Visit (HOSPITAL_COMMUNITY)
Admission: RE | Admit: 2015-11-06 | Discharge: 2015-11-06 | Disposition: A | Discharge status: Home / Self Care | Payer: PPO | Source: Ambulatory Visit | Attending: Obstetrics and Gynecology | Admitting: Obstetrics and Gynecology

## 2015-11-06 ENCOUNTER — Other Ambulatory Visit: Payer: Self-pay | Admitting: Obstetrics and Gynecology

## 2015-11-06 DIAGNOSIS — N870 Mild cervical dysplasia: Secondary | ICD-10-CM | POA: Diagnosis not present

## 2015-11-06 DIAGNOSIS — Z01419 Encounter for gynecological examination (general) (routine) without abnormal findings: Secondary | ICD-10-CM | POA: Diagnosis not present

## 2015-11-06 DIAGNOSIS — Z1151 Encounter for screening for human papillomavirus (HPV): Secondary | ICD-10-CM | POA: Diagnosis not present

## 2015-11-07 DIAGNOSIS — M545 Low back pain: Secondary | ICD-10-CM | POA: Diagnosis not present

## 2015-11-07 DIAGNOSIS — Z9181 History of falling: Secondary | ICD-10-CM | POA: Diagnosis not present

## 2015-11-07 DIAGNOSIS — M6281 Muscle weakness (generalized): Secondary | ICD-10-CM | POA: Diagnosis not present

## 2015-11-07 LAB — CYTOLOGY - PAP

## 2015-11-12 ENCOUNTER — Encounter: Payer: Self-pay | Admitting: Pediatrics

## 2015-11-12 ENCOUNTER — Ambulatory Visit (INDEPENDENT_AMBULATORY_CARE_PROVIDER_SITE_OTHER): Payer: PPO | Admitting: Pediatrics

## 2015-11-12 VITALS — BP 106/60 | HR 52 | Temp 97.4°F | Resp 16 | Ht 62.0 in | Wt 104.8 lb

## 2015-11-12 DIAGNOSIS — C9001 Multiple myeloma in remission: Secondary | ICD-10-CM | POA: Diagnosis not present

## 2015-11-12 DIAGNOSIS — Z79899 Other long term (current) drug therapy: Secondary | ICD-10-CM | POA: Diagnosis not present

## 2015-11-12 DIAGNOSIS — I25721 Atherosclerosis of autologous artery coronary artery bypass graft(s) with angina pectoris with documented spasm: Secondary | ICD-10-CM

## 2015-11-12 DIAGNOSIS — K219 Gastro-esophageal reflux disease without esophagitis: Secondary | ICD-10-CM | POA: Diagnosis not present

## 2015-11-12 DIAGNOSIS — J4530 Mild persistent asthma, uncomplicated: Secondary | ICD-10-CM | POA: Insufficient documentation

## 2015-11-12 DIAGNOSIS — J3089 Other allergic rhinitis: Secondary | ICD-10-CM

## 2015-11-12 MED ORDER — FLUTICASONE PROPIONATE 50 MCG/ACT NA SUSP: NASAL | Status: DC

## 2015-11-12 NOTE — Progress Notes (Signed)
[place] [id number] Dept: [id number]  New Patient Note  Patient ID: [name], female    DOB: 05-18-1932  Age: 82 y.o. MRN: [id number] Date of Office Visit: 11/12/2015 Referring provider: Lucille Passy, MD [address] [place], [place] [address]    Chief Complaint: Allergy Testing  HPI ITZELL BENDAVID presents for evaluation of a runny nose since 1953. She has aggravation of her symptoms on exposure to dust, cigarette smoke and weather changes. She has had asthma for several years. His symptoms are better with rainy days. She has gastroesophageal reflux and cannot tolerate onions and black pepper. She has had multiple drug allergies: She would  like to know what antibiotics could be used if she were to need them in the future. She has had multiple myeloma for 10 years which is indolent. In October she was started on prednisone for polymyalgia rheumatica and her last dose of prednisone was 10 days ago. She has had coronary artery bypass surgery and does have some chest tightness with any exercise. Her cardiologist is aware of the symptoms. She has had several emergency room visits for chest pain and she was always diagnosed as  having gastroesophageal reflux instead.  Review of Systems  Constitutional:       Shortness of breath and chest tightness with any exertion  HENT:       Runny nose and sneezing since since 1953.  Eyes:       Droopy upper eyelids affecting her vision  and bilateral lens implants  Respiratory:       Asthma for several years. She has used Event organ[employer].  Cardiovascular:       Coronary artery bypass surgery in 2007. Hypertension. Beta blocker use.  Gastrointestinal:       Gastroesophageal reflux  Genitourinary:       Urinary incontinence  Musculoskeletal:       Raynaud's. Hammertoe surgery. Polymyalgia rheumatica. She was started on prednisone in October 2016. Her last dose of prednisone was 10 days ago  Skin: Negative.   Neurological:         Spinal stenosis of the lumbosacral region  Endo/Heme/Allergies:       Multiple myeloma for 10 years-indolent. No thyroid disease. No diabetes.  Psychiatric/Behavioral:       Dementia    Outpatient Encounter Prescriptions as of 11/12/2015  Medication Sig  . acetaminophen (TYLENOL) 500 MG tablet Take 1 tablet by mouth 2 (two) times daily.  . Albuterol Sulfate (PROAIR RESPICLICK IN) Inhale 2 puffs into the lungs 4 (four) times daily.  Marland Kitchen aspirin 325 MG tablet Take 325 mg by mouth daily.  . Biotin 5000 MCG CAPS Take 5,000 mcg by mouth every evening.   . calcium carbonate (OS-CAL) 600 MG TABS tablet Take 600 mg by mouth daily with breakfast.  . Coenzyme Q10 (COQ-10) 100 MG CAPS Take 1 capsule by mouth at bedtime.  . donepezil (ARICEPT) 10 MG tablet Take 10 mg by mouth at bedtime.  . famotidine (PEPCID) 40 MG tablet Take 40 mg by mouth at bedtime.  . Hypertonic Nasal Wash (SINUS RINSE NA) Place 1 application into the nose daily.  . isosorbide mononitrate (IMDUR) 60 MG 24 hr tablet Take 1 tablet (60 mg total) by mouth daily.  Marland Kitchen LORazepam (ATIVAN) 1 MG tablet   . metoprolol tartrate (LOPRESSOR) 25 MG tablet Take 1 tablet (25 mg total) by mouth 2 (two) times daily.  . mirabegron ER (MYRBETRIQ) 25 MG TB24 tablet Take  25 mg by mouth daily.  . montelukast (SINGULAIR) 10 MG tablet Take 10 mg by mouth at bedtime.   . Multiple Vitamin (MULITIVITAMIN WITH MINERALS) TABS Take 1 tablet by mouth daily.  . NON FORMULARY Take 30 mLs by mouth daily. Reported on 11/12/2015  . Polyethyl Glycol-Propyl Glycol (SYSTANE OP) Apply 1 drop to eye daily as needed (for dry eyes).  . polyethylene glycol (MIRALAX / GLYCOLAX) packet Take 17 g by mouth at bedtime.  . rosuvastatin (CRESTOR) 5 MG tablet Take 1 tablet (5 mg total) by mouth daily at 6 PM.  . sertraline (ZOLOFT) 25 MG tablet Take 50 mg by mouth at bedtime.   Marland Kitchen UNABLE TO FIND Inject into the skin once a week. Med Name: [facility] to  right ankle (lasts 30 minutes)  . [DISCONTINUED] LORazepam (ATIVAN) 0.5 MG tablet Take 0.5 mg by mouth at bedtime as needed for sleep.   . fluticasone (FLONASE) 50 MCG/ACT nasal spray 1-2 sprays in each nostril once daily for stuffy nose.  . nitroGLYCERIN (NITROSTAT) 0.4 MG SL tablet Place 0.4 mg under the tongue every 5 (five) minutes as needed for chest pain. Reported on 11/12/2015  . [DISCONTINUED] acetaminophen (TYLENOL) 500 MG tablet Take 1,000 mg by mouth 2 (two) times daily.  . [DISCONTINUED] clindamycin (CLEOCIN) 300 MG capsule Take 1 capsule (300 mg total) by mouth 3 (three) times daily. (Patient taking differently: Take 300 mg by mouth 2 (two) times daily. )  . [DISCONTINUED] predniSONE (DELTASONE) 5 MG tablet Take 5 mg by mouth daily with breakfast.    No facility-administered encounter medications on file as of 11/12/2015.     Drug Allergies:  Allergies  Allergen Reactions  . Cephalosporins Other (See Comments)    unknown  . Codeine Nausea Only  . Levaquin [Levofloxacin] Shortness Of Breath  . Macrodantin Other (See Comments)    Double vision  . Morphine And Related Other (See Comments)    hallucinations  . Trimethoprim Nausea Only  . Cilostazol Other (See Comments)    Stomach upset  . Dexilant [Dexlansoprazole] Diarrhea  . Erythromycin Nausea And Vomiting    All "mycin" drugs  . Nitrofurantoin     Other reaction(s): Other (See Comments) Double vision  . Simvastatin Other (See Comments)    Muscle aches  . Sulfa Antibiotics Other (See Comments)    unknown  . Vesicare [Solifenacin] Other (See Comments)    unknown  . Butazolidin [Phenylbutazone] Swelling and Rash  . Clindamycin/Lincomycin Other (See Comments)    Upset stomach  . Food Other (See Comments)    No spicy foods or black pepper, raw onions - causes gerd  . Ibuprofen Hives and Rash  . Lincomycin Hcl Other (See Comments)    Upset stomach  . Penicillins Rash    Has patient had a PCN reaction causing immediate  rash, facial/tongue/throat swelling, SOB or lightheadedness with hypotension: Yes Has patient had a PCN reaction causing severe rash involving mucus membranes or skin necrosis: No Has patient had a PCN reaction that required hospitalization No Has patient had a PCN reaction occurring within the last 10 years: No If all of the above answers are "NO", then may proceed with Cephalosporin use.   Lisbeth Ply [Fesoterodine Fumarate Er] Other (See Comments)    unknown  . Voltaren [Diclofenac Sodium] Rash    Family History: Lonni's family history includes CAD in her mother; Cancer in her paternal aunt; Heart attack in her mother; Obesity in her  sister. There is no history of Asthma, Eczema, Urticaria, Allergic rhinitis, Angioedema, Atopy, or Immunodeficiency..  Social and environmental. She lives in a retirement home. She does not have any pets. She is not exposed to cigarette smoking. She has never smoked cigarettes.  Physical Exam: BP 106/60 mmHg  Pulse 52  Temp(Src) 97.4 F (36.3 C) (Oral)  Resp 16  Ht 5' 2" (1.575 m)  Wt 104 lb 12.8 oz (47.537 kg)  BMI 19.16 kg/m2   Physical Exam  Constitutional: She is oriented to person, place, and time. She appears well-developed and well-nourished.  HENT:  l. Ears normal. Nose mild swelling of nasal turbinates. Pharynx normal.  Eyes:  Upper eyelids are droopy  Neck: Neck supple. No thyromegaly present.  Cardiovascular: Normal rate, regular rhythm and normal heart sounds.   Pulmonary/Chest:  Clear to percussion and auscultation  Abdominal: Soft. There is no tenderness (no hepatosplenomegaly).  Lymphadenopathy:    She has no cervical adenopathy.  Neurological: She is alert and oriented to person, place, and time.  Skin:  Clear  Psychiatric: She has a normal mood and affect. Her behavior is normal. Judgment and thought content normal.  Vitals reviewed.   Diagnostics: Allergy skin tests were positive to some molds on intradermal testing  only.. Skin testing to foods was negative  FVC 1.34 L FEV1 1.15 L. Predicted FVC 2.30 L predicted FEV1 1.70 L. After albuterol 2 puffs FVC 1.58 L FEV1 1.27 L-this shows a moderate reduction in the forced vital capacity and FEV1 with significant improvement after albuterol   Assessment Assessment and Plan: 1. Mild persistent asthma, uncomplicated   2. Coronary artery disease involving autologous artery coronary bypass graft with angina pectoris with documented spasm ([place])   3. Multiple myeloma in remission ([place])   4. Current use of beta blocker   5. Gastroesophageal reflux disease without esophagitis   6. Other allergic rhinitis     Meds ordered this encounter  Medications  . fluticasone (FLONASE) 50 MCG/ACT nasal spray    Sig: 1-2 sprays in each nostril once daily for stuffy nose.    Dispense:  16 g    Refill:  5    Patient will call.    Patient Instructions  Environmental control of dust and mold Claritin 10 mg once a day if needed for runny nose Fluticasone one or 2 sprays per nostril once a day for stuffy nose Pro-air RespiClick 2 puffs every 4 hours if needed for coughing or wheezing Montelukast 10 mg once a day for coughing or wheezing. Continue on your other medications  Your family doctor could give you a Zithromax pack or doxycycline if you have an infection    Return in about 6 weeks (around 12/24/2015).   Thank you for the opportunity to care for this patient.  Please do not hesitate to contact me with questions.  Penne Lash, M.D.  [facility] [address] [place], [place] [address] [phone]

## 2015-11-12 NOTE — Patient Instructions (Addendum)
Environmental control of dust and mold Claritin 10 mg once a day if needed for runny nose Fluticasone one or 2 sprays per nostril once a day for stuffy nose Pro-air RespiClick 2 puffs every 4 hours if needed for coughing or wheezing Montelukast 10 mg once a day for coughing or wheezing. Continue on your other medications  Your family doctor could give you a Zithromax pack or doxycycline if you have an infection

## 2015-11-14 DIAGNOSIS — R269 Unspecified abnormalities of gait and mobility: Secondary | ICD-10-CM | POA: Diagnosis not present

## 2015-11-15 DIAGNOSIS — R197 Diarrhea, unspecified: Secondary | ICD-10-CM | POA: Diagnosis not present

## 2015-11-21 DIAGNOSIS — R197 Diarrhea, unspecified: Secondary | ICD-10-CM | POA: Diagnosis not present

## 2015-11-21 DIAGNOSIS — K5900 Constipation, unspecified: Secondary | ICD-10-CM | POA: Diagnosis not present

## 2015-11-21 DIAGNOSIS — K219 Gastro-esophageal reflux disease without esophagitis: Secondary | ICD-10-CM | POA: Diagnosis not present

## 2015-11-25 ENCOUNTER — Encounter (HOSPITAL_COMMUNITY): Payer: Self-pay | Admitting: Emergency Medicine

## 2015-11-25 ENCOUNTER — Observation Stay (HOSPITAL_COMMUNITY)
Admission: EM | Admit: 2015-11-25 | Discharge: 2015-11-26 | Disposition: A | Discharge status: Home / Self Care | Payer: PPO | Attending: Internal Medicine | Admitting: Internal Medicine

## 2015-11-25 ENCOUNTER — Emergency Department (HOSPITAL_COMMUNITY): Payer: PPO

## 2015-11-25 ENCOUNTER — Other Ambulatory Visit: Payer: Self-pay

## 2015-11-25 DIAGNOSIS — Z7982 Long term (current) use of aspirin: Secondary | ICD-10-CM | POA: Insufficient documentation

## 2015-11-25 DIAGNOSIS — Z951 Presence of aortocoronary bypass graft: Secondary | ICD-10-CM | POA: Diagnosis not present

## 2015-11-25 DIAGNOSIS — E7800 Pure hypercholesterolemia, unspecified: Secondary | ICD-10-CM | POA: Diagnosis not present

## 2015-11-25 DIAGNOSIS — R079 Chest pain, unspecified: Secondary | ICD-10-CM | POA: Diagnosis not present

## 2015-11-25 DIAGNOSIS — C9001 Multiple myeloma in remission: Secondary | ICD-10-CM | POA: Diagnosis not present

## 2015-11-25 DIAGNOSIS — R0789 Other chest pain: Secondary | ICD-10-CM | POA: Diagnosis not present

## 2015-11-25 DIAGNOSIS — I10 Essential (primary) hypertension: Secondary | ICD-10-CM | POA: Insufficient documentation

## 2015-11-25 DIAGNOSIS — Z66 Do not resuscitate: Secondary | ICD-10-CM | POA: Diagnosis not present

## 2015-11-25 DIAGNOSIS — E876 Hypokalemia: Secondary | ICD-10-CM | POA: Insufficient documentation

## 2015-11-25 DIAGNOSIS — M353 Polymyalgia rheumatica: Secondary | ICD-10-CM | POA: Insufficient documentation

## 2015-11-25 DIAGNOSIS — Z79899 Other long term (current) drug therapy: Secondary | ICD-10-CM | POA: Insufficient documentation

## 2015-11-25 DIAGNOSIS — K219 Gastro-esophageal reflux disease without esophagitis: Secondary | ICD-10-CM | POA: Insufficient documentation

## 2015-11-25 DIAGNOSIS — J45909 Unspecified asthma, uncomplicated: Secondary | ICD-10-CM | POA: Insufficient documentation

## 2015-11-25 DIAGNOSIS — M8580 Other specified disorders of bone density and structure, unspecified site: Secondary | ICD-10-CM | POA: Diagnosis not present

## 2015-11-25 DIAGNOSIS — I214 Non-ST elevation (NSTEMI) myocardial infarction: Secondary | ICD-10-CM | POA: Diagnosis present

## 2015-11-25 DIAGNOSIS — R42 Dizziness and giddiness: Secondary | ICD-10-CM | POA: Diagnosis not present

## 2015-11-25 DIAGNOSIS — Z955 Presence of coronary angioplasty implant and graft: Secondary | ICD-10-CM | POA: Insufficient documentation

## 2015-11-25 DIAGNOSIS — Z7952 Long term (current) use of systemic steroids: Secondary | ICD-10-CM | POA: Insufficient documentation

## 2015-11-25 DIAGNOSIS — N3281 Overactive bladder: Secondary | ICD-10-CM | POA: Insufficient documentation

## 2015-11-25 DIAGNOSIS — I2510 Atherosclerotic heart disease of native coronary artery without angina pectoris: Secondary | ICD-10-CM | POA: Diagnosis not present

## 2015-11-25 DIAGNOSIS — R072 Precordial pain: Secondary | ICD-10-CM | POA: Diagnosis not present

## 2015-11-25 DIAGNOSIS — E785 Hyperlipidemia, unspecified: Secondary | ICD-10-CM | POA: Diagnosis not present

## 2015-11-25 LAB — BASIC METABOLIC PANEL
ANION GAP: 8 (ref 5–15)
BUN: 16 mg/dL (ref 6–20)
CALCIUM: 9.3 mg/dL (ref 8.9–10.3)
CHLORIDE: 99 mmol/L — AB (ref 101–111)
CO2: 26 mmol/L (ref 22–32)
Creatinine, Ser: 0.89 mg/dL (ref 0.44–1.00)
GFR calc Af Amer: >60 mL/min (ref >60)
GFR calc non Af Amer: 59 mL/min — ABNORMAL LOW (ref >60)
GLUCOSE: 97 mg/dL (ref 65–99)
Potassium: 3.3 mmol/L — ABNORMAL LOW (ref 3.5–5.1)
Sodium: 133 mmol/L — ABNORMAL LOW (ref 135–145)

## 2015-11-25 LAB — URINALYSIS, ROUTINE W REFLEX MICROSCOPIC
Bilirubin Urine: NEGATIVE
GLUCOSE, UA: NEGATIVE mg/dL
Hgb urine dipstick: NEGATIVE
KETONES UR: NEGATIVE mg/dL
LEUKOCYTES UA: NEGATIVE
NITRITE: NEGATIVE
PH: 7.5 (ref 5.0–8.0)
Protein, ur: NEGATIVE mg/dL
SPECIFIC GRAVITY, URINE: 1.01 (ref 1.005–1.030)

## 2015-11-25 LAB — I-STAT TROPONIN, ED: TROPONIN I, POC: 0 ng/mL (ref 0.00–0.08)

## 2015-11-25 LAB — CBC
HCT: 33.1 % — ABNORMAL LOW (ref 36.0–46.0)
HEMOGLOBIN: 10.9 g/dL — AB (ref 12.0–15.0)
MCH: 32.1 pg (ref 26.0–34.0)
MCHC: 32.9 g/dL (ref 30.0–36.0)
MCV: 97.4 fL (ref 78.0–100.0)
Platelets: 213 10*3/uL (ref 150–400)
RBC: 3.4 MIL/uL — ABNORMAL LOW (ref 3.87–5.11)
RDW: 13.5 % (ref 11.5–15.5)
WBC: 3.1 10*3/uL — ABNORMAL LOW (ref 4.0–10.5)

## 2015-11-25 NOTE — ED Notes (Signed)
Brought via EMS from [facility] (apartment).  Reports central chest pain with initial SOB and dizziness.  Nurse from facility saw patient and gave Asa 324mg  and NTG SL X 1.  Given second dose of NTG en route.  Pain originally rated at 8/10 reduced to 2/10 at this time.

## 2015-11-25 NOTE — ED Provider Notes (Signed)
CSN: [id number]     Arrival date & time    History   First MD Initiated Contact with Patient 11/25/15 2125     Chief Complaint  Patient presents with  . Chest Pain    Patient is a 82 y.o. female presenting with chest pain. The history is provided by the patient.  Chest Pain Pain location:  Substernal area Pain quality: crushing   Pain radiates to:  Does not radiate Pain radiates to the back: no   Pain severity:  Severe Onset quality:  Sudden Duration: Began at 7am. Timing:  Constant Progression:  Resolved Chronicity:  New Context: at rest   Context comment:  While laying in bed Relieved by:  Aspirin and nitroglycerin Worsened by:  Nothing tried Ineffective treatments:  None tried Associated symptoms: dizziness   Associated symptoms: no abdominal pain, no fever, no nausea, no shortness of breath and not vomiting   Risk factors: coronary artery disease and hypertension     Past Medical History  Diagnosis Date  . Hx of CABG   . Hypertension   . GERD (gastroesophageal reflux disease)   . Anxiety   . Coronary artery disease   . Arthritis   . Anginal pain ([place])   . Shortness of breath   . Intermediate coronary syndrome ([place])   . Raynauds syndrome   . Coronary atherosclerosis of unspecified type of vessel, native or graft   . Coronary atherosclerosis of autologous vein bypass graft   . Spinal stenosis of lumbar region   . Hypercholesteremia   . Hyperlipidemia     GOAL LDL <70  . Panic disorder   . Osteopenia   . Multiple myeloma ([facility])     Dr. Benay Spice  . Mild chronic anemia   . Urine test positive for microalbuminuria 12/2011  . Ovarian cyst, bilateral     Rt complex  . Allergic rhinitis   . Atypical chest pain 04/25/13    Doing better, does occasionally respond to Nitroglycerin  . Overactive bladder   . Incontinent of urine   . Asthma    Past Surgical History  Procedure Laterality Date  . Tonsillectomy and adenoidectomy    . Dilation and curettage of uterus     . Eye surgery      both cataracts  . Colonoscopy    . Upper gastrointestinal endoscopy    . Capsulotomy Right 08/04/2012    Procedure: CAPSULOTOMY;  Surgeon: Colin Rhein, MD;  Location: [facility];  Service: Orthopedics;  Laterality: Right;  RIGHT 2ND TOE METATARSOPHALANGEAL JOINT DORSAL CAPSULOTOMY   . Bunionectomy with hammertoe reconstruction Right 08/04/2012    Procedure:  HAMMERTOE RECONSTRUCTION;  Surgeon: Colin Rhein, MD;  Location: [facility];  Service: Orthopedics;  Laterality: Right;  HAMMER TOE RECONSTRUCTION PROBABLE FLEXOR DIGITORUM LONGUS TO PROXIMAL PHALANX TRANSFER LATERALIZED   . Coronary artery bypass graft  2007    LIMA to LAD, SVG to RCA  non-ST segment MI 08/2009 (Dr. Tamala Julian)  . Hammer toe surgery Right 08/2012  . Cataract extraction, bilateral    . Coronary angioplasty with stent placement  2014    2 stents  . Left heart catheterization with coronary/graft angiogram N/A 07/09/2012    Procedure: LEFT HEART CATHETERIZATION WITH Beatrix Fetters;  Surgeon: Sinclair Grooms, MD;  [facility];  Service: Cardiovascular;  Laterality: N/A;   Family History  Problem Relation Age of Onset  . Heart attack Mother   . CAD Mother   .  Obesity Sister   . Cancer Paternal Aunt     breast  . Asthma Neg Hx   . Eczema Neg Hx   . Urticaria Neg Hx   . Allergic rhinitis Neg Hx   . Angioedema Neg Hx   . Atopy Neg Hx   . Immunodeficiency Neg Hx    Social History  Substance Use Topics  . Smoking status: Never Smoker   . Smokeless tobacco: Never Used  . Alcohol Use: Yes     Comment: occasional   OB History    No data available     Review of Systems  Constitutional: Negative for fever and chills.  HENT: Negative for congestion.   Eyes: Negative for redness.  Respiratory: Negative for shortness of breath.   Cardiovascular: Positive for chest pain.  Gastrointestinal: Negative for nausea, vomiting and abdominal pain.   Genitourinary: Negative for flank pain.  Musculoskeletal: Negative for neck stiffness.  Skin: Negative for rash.  Neurological: Positive for dizziness. Negative for speech difficulty.  Psychiatric/Behavioral: Negative for confusion.      Allergies  Cephalosporins; Codeine; Levaquin; Macrodantin; Morphine and related; Trimethoprim; Cilostazol; Dexilant; Erythromycin; Nitrofurantoin; Simvastatin; Sulfa antibiotics; Vesicare; Butazolidin; Clindamycin/lincomycin; Food; Ibuprofen; Lincomycin hcl; Penicillins; Toviaz; and Voltaren  Home Medications   Prior to Admission medications   Medication Sig Start Date End Date Taking? Authorizing Provider  acetaminophen (TYLENOL) 500 MG tablet Take 1 tablet by mouth 2 (two) times daily.    Historical Provider, MD  Albuterol Sulfate (PROAIR RESPICLICK IN) Inhale 2 puffs into the lungs 4 (four) times daily.    Historical Provider, MD  aspirin 325 MG tablet Take 325 mg by mouth daily.    Historical Provider, MD  Biotin 5000 MCG CAPS Take 5,000 mcg by mouth every evening.     Historical Provider, MD  calcium carbonate (OS-CAL) 600 MG TABS tablet Take 600 mg by mouth daily with breakfast.    Historical Provider, MD  Coenzyme Q10 (COQ-10) 100 MG CAPS Take 1 capsule by mouth at bedtime.    Historical Provider, MD  donepezil (ARICEPT) 10 MG tablet Take 10 mg by mouth at bedtime.    Historical Provider, MD  famotidine (PEPCID) 40 MG tablet Take 40 mg by mouth at bedtime.    Historical Provider, MD  fluticasone (FLONASE) 50 MCG/ACT nasal spray 1-2 sprays in each nostril once daily for stuffy nose. 11/12/15   Charlies Silvers, MD  Hypertonic Nasal Wash (SINUS RINSE NA) Place 1 application into the nose daily.    Historical Provider, MD  isosorbide mononitrate (IMDUR) 60 MG 24 hr tablet Take 1 tablet (60 mg total) by mouth daily. 06/14/15   Almyra Deforest, PA  LORazepam (ATIVAN) 1 MG tablet  11/08/15   Historical Provider, MD  metoprolol tartrate (LOPRESSOR) 25 MG tablet Take  1 tablet (25 mg total) by mouth 2 (two) times daily. 06/14/15   Almyra Deforest, PA  mirabegron ER (MYRBETRIQ) 25 MG TB24 tablet Take 25 mg by mouth daily.    Historical Provider, MD  montelukast (SINGULAIR) 10 MG tablet Take 10 mg by mouth at bedtime.     Historical Provider, MD  Multiple Vitamin (MULITIVITAMIN WITH MINERALS) TABS Take 1 tablet by mouth daily.    Historical Provider, MD  nitroGLYCERIN (NITROSTAT) 0.4 MG SL tablet Place 0.4 mg under the tongue every 5 (five) minutes as needed for chest pain. Reported on 11/12/2015    Historical Provider, MD  NON FORMULARY Take 30 mLs by mouth daily. Reported on 11/12/2015  Historical Provider, MD  Polyethyl Glycol-Propyl Glycol (SYSTANE OP) Apply 1 drop to eye daily as needed (for dry eyes).    Historical Provider, MD  polyethylene glycol (MIRALAX / GLYCOLAX) packet Take 17 g by mouth at bedtime.    Historical Provider, MD  rosuvastatin (CRESTOR) 5 MG tablet Take 1 tablet (5 mg total) by mouth daily at 6 PM. 06/14/15   Almyra Deforest, PA  sertraline (ZOLOFT) 25 MG tablet Take 50 mg by mouth at bedtime.     Historical Provider, MD  UNABLE TO FIND Inject into the skin once a week. Med Name: [facility] per [facility] Q 3 weeks to right ankle (lasts 30 minutes)    Historical Provider, MD   Temp(Src) 98.3 F (36.8 C) (Oral)  Ht _0  (1.6 m)  Wt 45.36 kg  BMI 17.72 kg/m2  SpO2 100% Physical Exam  Constitutional: She is oriented to person, place, and time. She appears well-developed and well-nourished. No distress.  HENT:  Head: Normocephalic and atraumatic.  Right Ear: External ear normal.  Left Ear: External ear normal.  Mouth/Throat: Oropharynx is clear and moist.  Neck: Normal range of motion.  Cardiovascular: Normal rate, regular rhythm and intact distal pulses.   Pulses:      Radial pulses are 2+ on the right side, and 2+ on the left side.       Dorsalis pedis pulses are 2+ on the right side, and 2+ on the left side.  No peripheral edema   Pulmonary/Chest: Effort normal and breath sounds normal. No respiratory distress. She has no wheezes.  Abdominal: Soft. Bowel sounds are normal. She exhibits no distension. There is no tenderness.  Neurological: She is alert and oriented to person, place, and time.  Face symmetric, speech clear and appropriate, normal strength in major muscle groups of upper and lower extremities, no pronator drift  Skin: Skin is warm. She is not diaphoretic.  Psychiatric: She has a normal mood and affect.    ED Course  Procedures  Labs Review Labs Reviewed  BASIC METABOLIC PANEL - Abnormal; Notable for the following:    Sodium 133 (*)    Potassium 3.3 (*)    Chloride 99 (*)    GFR calc non Af Amer 59 (*)    All other components within normal limits  CBC - Abnormal; Notable for the following:    WBC 3.1 (*)    RBC 3.40 (*)    Hemoglobin 10.9 (*)    HCT 33.1 (*)    All other components within normal limits  URINALYSIS, ROUTINE W REFLEX MICROSCOPIC (NOT AT [facility])  Randolm Idol, ED    Imaging Review Dg Chest 2 View  11/25/2015  CLINICAL DATA:  Initial evaluation for acute chest pain. EXAM: CHEST  2 VIEW COMPARISON:  Prior radiograph from 10/21/2015. FINDINGS: Median sternotomy wires with underlying CABG markers and surgical clips noted, stable. Cardiac and mediastinal silhouettes within normal limits. Atheromatous plaque within the aortic arch. Lungs are normally inflated. No focal infiltrate, pulmonary edema, or pleural effusion. No pneumothorax. No acute osseous abnormality. IMPRESSION: 1. No active cardiopulmonary disease. 2. Sequela of prior CABG. Electronically Signed   By: Jeannine Boga M.D.   On: 11/25/2015 22:26   I have personally reviewed and evaluated these images and lab results as part of my medical decision-making.   EKG Interpretation   Date/Time:  Sunday November 25 2015 21:17:12 EDT Ventricular Rate:  64 PR Interval:    QRS Duration: 88 QT Interval:  413  QTC  Calculation: 427 R Axis:   -8 Text Interpretation:  Sinus rhythm RAE, consider biatrial enlargement  Probable left ventricular hypertrophy No significant change since last  tracing Confirmed by Maryan Rued  MD, Loree Fee ([id number]) on 11/25/2015 9:51:16 PM      MDM   Final diagnoses:  Chest pain, unspecified chest pain type   82 y.o. female with past history of coronary artery disease status post CABG presents to the ED after experiencing "crushing" chest pain at 7 PM. She resides at [facility] landing, and the nurse there gave her aspirin and nitroglycerin. She had resolution of her pain after this. Denies nausea, vomiting, diaphoresis. Remainder of history of present illness, review of systems, exam as above.  Her presentation is concerning for possible ACS. I do not suspect PE, aortic dissection, esophagitis, mediastinitis.  EKG unchanged from prior, no acute ischemic changes. CBC and BMP are unremarkable. Urinalysis shows no infection. Troponin undetectable. Chest x-ray shows no acute cardiopulmonary disease.  I discussed her case with the hospitalist for admission. He requested that I contact cardiology. Cardiology, Dr. Wynonia Lawman, was contacted and he recommended admission to the hospitalist. Dr. Alcario Drought was recontacted for admission. He will evaluate the patient for admission.  Case managed in conjunction with my attending, Dr. Maryan Rued.    Berenice Primas, MD 11/26/15 [id number]  Blanchie Dessert, MD 11/28/15 5789

## 2015-11-26 DIAGNOSIS — C9001 Multiple myeloma in remission: Secondary | ICD-10-CM

## 2015-11-26 DIAGNOSIS — R197 Diarrhea, unspecified: Secondary | ICD-10-CM | POA: Diagnosis not present

## 2015-11-26 DIAGNOSIS — R109 Unspecified abdominal pain: Secondary | ICD-10-CM | POA: Diagnosis not present

## 2015-11-26 DIAGNOSIS — K219 Gastro-esophageal reflux disease without esophagitis: Secondary | ICD-10-CM | POA: Diagnosis not present

## 2015-11-26 DIAGNOSIS — R079 Chest pain, unspecified: Secondary | ICD-10-CM

## 2015-11-26 DIAGNOSIS — K5900 Constipation, unspecified: Secondary | ICD-10-CM | POA: Diagnosis not present

## 2015-11-26 DIAGNOSIS — E876 Hypokalemia: Secondary | ICD-10-CM

## 2015-11-26 DIAGNOSIS — I214 Non-ST elevation (NSTEMI) myocardial infarction: Secondary | ICD-10-CM | POA: Diagnosis present

## 2015-11-26 DIAGNOSIS — M353 Polymyalgia rheumatica: Secondary | ICD-10-CM

## 2015-11-26 DIAGNOSIS — I10 Essential (primary) hypertension: Secondary | ICD-10-CM

## 2015-11-26 DIAGNOSIS — E785 Hyperlipidemia, unspecified: Secondary | ICD-10-CM

## 2015-11-26 LAB — SEDIMENTATION RATE: Sed Rate: 75 mm/hr — ABNORMAL HIGH (ref 0–22)

## 2015-11-26 LAB — TROPONIN I: Troponin I: <0.03 ng/mL (ref <0.031)

## 2015-11-26 MED ORDER — FLUTICASONE PROPIONATE 50 MCG/ACT NA SUSP
2.0000 | Freq: Every day | NASAL | Status: DC
  Filled 2015-11-26: qty 16

## 2015-11-26 MED ORDER — METOPROLOL TARTRATE 25 MG PO TABS
25.0000 mg | ORAL_TABLET | Freq: Every day | ORAL | Status: DC
  Administered 2015-11-26: 25 mg via ORAL
  Filled 2015-11-26: qty 1

## 2015-11-26 MED ORDER — MIRABEGRON ER 25 MG PO TB24
25.0000 mg | ORAL_TABLET | Freq: Every day | ORAL | Status: DC
  Administered 2015-11-26: 25 mg via ORAL
  Filled 2015-11-26: qty 1

## 2015-11-26 MED ORDER — ISOSORBIDE MONONITRATE ER 60 MG PO TB24
60.0000 mg | ORAL_TABLET | Freq: Every day | ORAL | Status: DC
  Administered 2015-11-26: 60 mg via ORAL
  Filled 2015-11-26: qty 1

## 2015-11-26 MED ORDER — MONTELUKAST SODIUM 10 MG PO TABS: 10.0000 mg | ORAL_TABLET | Freq: Every day | ORAL | Status: DC

## 2015-11-26 MED ORDER — POTASSIUM CHLORIDE CRYS ER 20 MEQ PO TBCR
40.0000 meq | EXTENDED_RELEASE_TABLET | Freq: Once | ORAL | Status: AC
  Administered 2015-11-26: 40 meq via ORAL
  Filled 2015-11-26: qty 2

## 2015-11-26 MED ORDER — FAMOTIDINE 20 MG PO TABS: 40.0000 mg | ORAL_TABLET | Freq: Every day | ORAL | Status: DC

## 2015-11-26 MED ORDER — ONDANSETRON HCL 4 MG/2ML IJ SOLN: 4.0000 mg | Freq: Four times a day (QID) | INTRAMUSCULAR | Status: DC | PRN

## 2015-11-26 MED ORDER — ASPIRIN 325 MG PO TABS
325.0000 mg | ORAL_TABLET | Freq: Every day | ORAL | Status: DC
  Administered 2015-11-26: 325 mg via ORAL
  Filled 2015-11-26: qty 1

## 2015-11-26 MED ORDER — ROSUVASTATIN CALCIUM 5 MG PO TABS
5.0000 mg | ORAL_TABLET | Freq: Every day | ORAL | Status: DC
  Filled 2015-11-26: qty 1

## 2015-11-26 MED ORDER — POLYETHYL GLYCOL-PROPYL GLYCOL 0.4-0.3 % OP GEL: Freq: Every day | OPHTHALMIC | Status: DC | PRN

## 2015-11-26 MED ORDER — NITROGLYCERIN 0.4 MG SL SUBL: 0.4000 mg | SUBLINGUAL_TABLET | SUBLINGUAL | Status: DC | PRN

## 2015-11-26 MED ORDER — LORAZEPAM 1 MG PO TABS
1.0000 mg | ORAL_TABLET | Freq: Every day | ORAL | Status: DC
  Administered 2015-11-26: 1 mg via ORAL
  Filled 2015-11-26: qty 1

## 2015-11-26 MED ORDER — DONEPEZIL HCL 10 MG PO TABS
10.0000 mg | ORAL_TABLET | Freq: Every day | ORAL | Status: DC
  Administered 2015-11-26: 10 mg via ORAL
  Filled 2015-11-26: qty 1

## 2015-11-26 MED ORDER — ADULT MULTIVITAMIN W/MINERALS CH
1.0000 | ORAL_TABLET | Freq: Every day | ORAL | Status: DC
  Administered 2015-11-26: 1 via ORAL
  Filled 2015-11-26: qty 1

## 2015-11-26 MED ORDER — CALCIUM CARBONATE 1250 (500 CA) MG PO TABS
1250.0000 mg | ORAL_TABLET | Freq: Every day | ORAL | Status: DC
  Administered 2015-11-26: 1250 mg via ORAL
  Filled 2015-11-26: qty 1

## 2015-11-26 MED ORDER — BIOTIN 5000 MCG PO CAPS: 5000.0000 ug | ORAL_CAPSULE | Freq: Every evening | ORAL | Status: DC

## 2015-11-26 MED ORDER — ALBUTEROL SULFATE (2.5 MG/3ML) 0.083% IN NEBU: 2.5000 mg | INHALATION_SOLUTION | Freq: Four times a day (QID) | RESPIRATORY_TRACT | Status: DC | PRN

## 2015-11-26 MED ORDER — SERTRALINE HCL 50 MG PO TABS
50.0000 mg | ORAL_TABLET | Freq: Every day | ORAL | Status: DC
  Administered 2015-11-26: 50 mg via ORAL
  Filled 2015-11-26: qty 1

## 2015-11-26 MED ORDER — FAMOTIDINE 20 MG PO TABS: 20.0000 mg | ORAL_TABLET | Freq: Two times a day (BID) | ORAL | Status: DC

## 2015-11-26 MED ORDER — ACETAMINOPHEN 325 MG PO TABS: 650.0000 mg | ORAL_TABLET | ORAL | Status: DC | PRN

## 2015-11-26 MED ORDER — METOPROLOL TARTRATE 12.5 MG HALF TABLET
12.5000 mg | ORAL_TABLET | Freq: Every day | ORAL | Status: DC
  Filled 2015-11-26: qty 1

## 2015-11-26 MED ORDER — FAMOTIDINE 20 MG PO TABS
20.0000 mg | ORAL_TABLET | Freq: Every day | ORAL | Status: DC
  Administered 2015-11-26: 20 mg via ORAL
  Filled 2015-11-26: qty 1

## 2015-11-26 MED ORDER — METOPROLOL TARTRATE 25 MG PO TABS: 12.5000 mg | ORAL_TABLET | Freq: Two times a day (BID) | ORAL | Status: DC

## 2015-11-26 MED ORDER — LORATADINE 10 MG PO TABS
10.0000 mg | ORAL_TABLET | Freq: Every day | ORAL | Status: DC
  Administered 2015-11-26: 10 mg via ORAL
  Filled 2015-11-26: qty 1

## 2015-11-26 MED ORDER — ENOXAPARIN SODIUM 40 MG/0.4ML ~~LOC~~ SOLN
40.0000 mg | SUBCUTANEOUS | Status: DC
  Administered 2015-11-26: 40 mg via SUBCUTANEOUS
  Filled 2015-11-26: qty 0.4

## 2015-11-26 MED ORDER — ACETAMINOPHEN 500 MG PO TABS
1000.0000 mg | ORAL_TABLET | Freq: Two times a day (BID) | ORAL | Status: DC
  Administered 2015-11-26: 1000 mg via ORAL
  Filled 2015-11-26: qty 2

## 2015-11-26 MED ORDER — COQ-10 100 MG PO CAPS: 100.0000 mg | ORAL_CAPSULE | Freq: Every day | ORAL | Status: DC

## 2015-11-26 MED ORDER — ENOXAPARIN SODIUM 30 MG/0.3ML ~~LOC~~ SOLN: 30.0000 mg | SUBCUTANEOUS | Status: DC

## 2015-11-26 MED ORDER — POLYETHYLENE GLYCOL 3350 17 G PO PACK: 8.5000 g | PACK | Freq: Every day | ORAL | Status: DC

## 2015-11-26 NOTE — Discharge Summary (Signed)
Physician Discharge Summary  [name] RSW:[id number] DOB: Jan 17, 1932 DOA: 11/25/2015  PCP: Lucille Passy, MD  Admit date: 11/25/2015 Discharge date: 11/26/2015   Recommendations for Outpatient Follow-Up:   1. F/U with PCP for non-resolution or persistent symptoms.   Discharge Diagnosis:   Principal Problem:    Chest pain Active Problems:    Hyperlipidemia    Essential hypertension    Multiple myeloma in remission (HCC)    Polymyalgia rheumatica syndrome ([place])    Hypokalemia   Discharge disposition:  Home.  Discharge Condition: Stable.  Diet recommendation: Low sodium, heart healthy.    History of Present Illness:   [name] is an 82 y.o. female with a PMH of CAD status post CABG, hypertension, hyperlipidemia, chronic anemia, stable multiple myeloma, polymyalgia rheumatica on chronic prednisone with pain affecting the shoulders and arms who was admitted 11/25/15 with a chief complaint of chest pain. A review of her cardiology office notes indicates that she has had multiple cardiac admissions and procedures to investigate various complaints.  Hospital Course by Problem:   Principal Problem:   Chest pain Evaluated multiple times by multiple different cardiologists. Last seen locally by Dr. Tamala Julian on 07/06/15. Care subsequently transferred to [place] cardiology, Dr. Baxter Hire after she sought a second opinion for her various somatic complaints. Recent evaluation at [facility] by Dr. Elonda Husky did not turn up any significant cardiovascular abnormality. Discussed case with Dr. Irish Lack who recommended that the patient follow up with her PCP.  She was chest pain free during her hospital stay, with negative cardiac enzymes, no ischemic changes on EKG and noted to have a negative stress test within the past year.  Active Problems:   Hyperlipidemia Continue Crestor.    Essential hypertension Continue Metoprolol.    Multiple myeloma in  remission (HCC) Stable.    Polymyalgia rheumatica syndrome (HCC) Stable.    Hypokalemia Repleted.   Medical Consultants:    None.   Discharge Exam:   Filed Vitals:   11/26/15 0313 11/26/15 0823  BP: 176/53 150/57  Pulse: 57 50  Temp: 98.1 F (36.7 C)   Resp: 18    Filed Vitals:   11/26/15 0200 11/26/15 0247 11/26/15 0313 11/26/15 0823  BP: 152/63  176/53 150/57  Pulse: 61  57 50  Temp:  98.7 F (37.1 C) 98.1 F (36.7 C)   TempSrc:  Oral Oral   Resp: 11  18   Height:   '5\' 3"'  (1.6 m)   Weight:   44.679 kg (98 lb 8 oz)   SpO2: 96%  98%     Gen:  NAD Cardiovascular:  RRR, No M/R/G Respiratory: Lungs CTAB Gastrointestinal: Abdomen soft, NT/ND with normal active bowel sounds. Extremities: No C/E/C   The results of significant diagnostics from this hospitalization (including imaging, microbiology, ancillary and laboratory) are listed below for reference.     Procedures and Diagnostic Studies:   Dg Chest 2 View  11/25/2015  CLINICAL DATA:  Initial evaluation for acute chest pain. EXAM: CHEST  2 VIEW COMPARISON:  Prior radiograph from 10/21/2015. FINDINGS: Median sternotomy wires with underlying CABG markers and surgical clips noted, stable. Cardiac and mediastinal silhouettes within normal limits. Atheromatous plaque within the aortic arch. Lungs are normally inflated. No focal infiltrate, pulmonary edema, or pleural effusion. No pneumothorax. No acute osseous abnormality. IMPRESSION: 1. No active cardiopulmonary disease. 2. Sequela of prior CABG. Electronically Signed   By: Jeannine Boga M.D.   On: 11/25/2015 22:26  Labs:   Basic Metabolic Panel:  Recent Labs Lab 11/25/15 2124  NA 133*  K 3.3*  CL 99*  CO2 26  GLUCOSE 97  BUN 16  CREATININE 0.89  CALCIUM 9.3   GFR Estimated Creatinine Clearance: 35 mL/min (by C-G formula based on Cr of 0.89). Liver Function Tests: No results for input(s): AST, ALT, ALKPHOS, BILITOT, PROT, ALBUMIN in the  last 168 hours. No results for input(s): LIPASE, AMYLASE in the last 168 hours. No results for input(s): AMMONIA in the last 168 hours. Coagulation profile No results for input(s): INR, PROTIME in the last 168 hours.  CBC:  Recent Labs Lab 11/25/15 2124  WBC 3.1*  HGB 10.9*  HCT 33.1*  MCV 97.4  PLT 213   Cardiac Enzymes:  Recent Labs Lab 11/26/15 0152 11/26/15 0427 11/26/15 0731  TROPONINI <0.03 <0.03 <0.03     Discharge Instructions:   Discharge Instructions    Call MD for:  extreme fatigue    Complete by:  As directed      Call MD for:  persistant dizziness or light-headedness    Complete by:  As directed      Call MD for:  severe uncontrolled pain    Complete by:  As directed      Diet - low sodium heart healthy    Complete by:  As directed      Increase activity slowly    Complete by:  As directed             Medication List    TAKE these medications        acetaminophen 500 MG tablet  Commonly known as:  TYLENOL  Take 1,000 mg by mouth 2 (two) times daily.     aspirin 325 MG tablet  Take 325 mg by mouth every morning.     Biotin 5000 MCG Caps  Take 5,000 mcg by mouth every evening.     calcium carbonate 600 MG Tabs tablet  Commonly known as:  OS-CAL  Take 600 mg by mouth daily with breakfast.     CoQ-10 100 MG Caps  Take 100 mg by mouth at bedtime.     donepezil 10 MG tablet  Commonly known as:  ARICEPT  Take 10 mg by mouth at bedtime.     famotidine 40 MG tablet  Commonly known as:  PEPCID  Take 20-40 mg by mouth 2 (two) times daily. 20 mg every morning and 40 mg every evening     fluticasone 50 MCG/ACT nasal spray  Commonly known as:  FLONASE  1-2 sprays in each nostril once daily for stuffy nose.     isosorbide mononitrate 60 MG 24 hr tablet  Commonly known as:  IMDUR  Take 1 tablet (60 mg total) by mouth daily.     loratadine 10 MG tablet  Commonly known as:  CLARITIN  Take 10 mg by mouth daily.     LORazepam 1 MG tablet    Commonly known as:  ATIVAN  Take 1 mg by mouth at bedtime.     metoprolol tartrate 25 MG tablet  Commonly known as:  LOPRESSOR  Take 1 tablet (25 mg total) by mouth 2 (two) times daily.     montelukast 10 MG tablet  Commonly known as:  SINGULAIR  Take 10 mg by mouth at bedtime.     multivitamin with minerals Tabs tablet  Take 1 tablet by mouth daily.     MYRBETRIQ 25 MG Tb24 tablet  Generic  drug:  mirabegron ER  Take 25 mg by mouth every morning.     nitroGLYCERIN 0.4 MG SL tablet  Commonly known as:  NITROSTAT  Place 0.4 mg under the tongue every 5 (five) minutes as needed for chest pain. Reported on 11/12/2015     NON FORMULARY  Take 30 mLs by mouth daily. Reported on 11/25/2015     polyethylene glycol packet  Commonly known as:  MIRALAX / GLYCOLAX  Take 8.5 g by mouth at bedtime.     PROAIR RESPICLICK IN  Inhale 2 puffs into the lungs every 6 (six) hours as needed (for shortness of breath).     rosuvastatin 5 MG tablet  Commonly known as:  CRESTOR  Take 1 tablet (5 mg total) by mouth daily at 6 PM.     sertraline 25 MG tablet  Commonly known as:  ZOLOFT  Take 50 mg by mouth at bedtime.     SYSTANE OP  Apply 1 drop to eye daily as needed (for dry eyes).           Follow-up Information    Follow up with [facility], Christian Mate, MD. Go on 12/03/2015.   Specialty:  Internal Medicine   Why:  Hospital follow up./ appointment at 1:45 with PA   Contact information:   [address] [place] [address] [phone]       Schedule an appointment as soon as possible for a visit to follow up.   Why:  Will call you to schedule an appointment.    Contact information:   Dr. Stanford Breed  Address:  [address], [place], [place] [address]  Phone: [phone]       Time coordinating discharge: 30 minutes.  Signed:  RAMA,CHRISTINA  Pager [phone] [facility] 11/26/2015, 4:39 PM

## 2015-11-26 NOTE — Discharge Instructions (Signed)

## 2015-11-26 NOTE — Progress Notes (Signed)
Norton Blizzard to be D/C'd Home per MD order. Discussed with the patient and all questions fully answered.    VVS, Skin clean, dry and intact without evidence of skin break down, no evidence of skin tears noted.  IV catheter discontinued intact. Site without signs and symptoms of complications. Dressing and pressure applied.  An After Visit Summary was printed and given to the patient.  Patient escorted via [place], and D/C home via private auto.  [place] Numbers  11/26/2015 12:32 PM

## 2015-11-26 NOTE — H&P (Signed)
History and Physical    [name]:[id number] DOB: [birth year] DOA: 11/25/2015   PCP: Lucille Passy, MD Chief Complaint:  Chief Complaint  Patient presents with  . Chest Pain    HPI: [name] is a 82 y.o. female with medical history significant of CAD s/p CABG.  Last Stress test was negative and was in Aug of last year.  Last heart cath in 2014 Jan.  Patient presents to the ED with c/o chest pain.  Crushing quality, onset at 7am this morning, constant, is currently resolved at this time.  Got better with ASA and NTG.  Nothing made it worse, severity was severe.  ED Course: Trop negative, Dr. Wynonia Lawman wants medicine to admit.  Review of Systems: As per HPI otherwise 10 point review of systems negative.    Past Medical History  Diagnosis Date  . Hx of CABG   . Hypertension   . GERD (gastroesophageal reflux disease)   . Anxiety   . Coronary artery disease   . Arthritis   . Anginal pain ([place])   . Shortness of breath   . Intermediate coronary syndrome ([place])   . Raynauds syndrome   . Coronary atherosclerosis of unspecified type of vessel, native or graft   . Coronary atherosclerosis of autologous vein bypass graft   . Spinal stenosis of lumbar region   . Hypercholesteremia   . Hyperlipidemia     GOAL LDL <70  . Panic disorder   . Osteopenia   . Multiple myeloma (HCC)     Dr. Benay Spice  . Mild chronic anemia   . Urine test positive for microalbuminuria 12/2011  . Ovarian cyst, bilateral     Rt complex  . Allergic rhinitis   . Atypical chest pain 04/25/13    Doing better, does occasionally respond to Nitroglycerin  . Overactive bladder   . Incontinent of urine   . Asthma     Past Surgical History  Procedure Laterality Date  . Tonsillectomy and adenoidectomy    . Dilation and curettage of uterus    . Eye surgery      both cataracts  . Colonoscopy    . Upper gastrointestinal endoscopy    . Capsulotomy Right 08/04/2012    Procedure: CAPSULOTOMY;   Surgeon: Colin Rhein, MD;  Location: [facility];  Service: Orthopedics;  Laterality: Right;  RIGHT 2ND TOE METATARSOPHALANGEAL JOINT DORSAL CAPSULOTOMY   . Bunionectomy with hammertoe reconstruction Right 08/04/2012    Procedure:  HAMMERTOE RECONSTRUCTION;  Surgeon: Colin Rhein, MD;  Location: [facility];  Service: Orthopedics;  Laterality: Right;  HAMMER TOE RECONSTRUCTION PROBABLE FLEXOR DIGITORUM LONGUS TO PROXIMAL PHALANX TRANSFER LATERALIZED   . Coronary artery bypass graft  2007    LIMA to LAD, SVG to RCA  non-ST segment MI 08/2009 (Dr. Tamala Julian)  . Hammer toe surgery Right 08/2012  . Cataract extraction, bilateral    . Coronary angioplasty with stent placement  2014    2 stents  . Left heart catheterization with coronary/graft angiogram N/A 07/09/2012    Procedure: LEFT HEART CATHETERIZATION WITH Beatrix Fetters;  Surgeon: Sinclair Grooms, MD;  Location: [facility] CATH LAB;  Service: Cardiovascular;  Laterality: N/A;     reports that she has never smoked. She has never used smokeless tobacco. She reports that she drinks alcohol. She reports that she does not use illicit drugs.  Allergies  Allergen Reactions  . Cephalosporins Other (See Comments)    unknown  .  Codeine Nausea Only  . Levaquin [Levofloxacin] Shortness Of Breath  . Macrodantin Other (See Comments)    Double vision  . Morphine And Related Other (See Comments)    hallucinations  . Trimethoprim Nausea Only  . Cilostazol Other (See Comments)    Stomach upset  . Dexilant [Dexlansoprazole] Diarrhea  . Erythromycin Nausea And Vomiting    All "mycin" drugs  . Nitrofurantoin     Other reaction(s): Other (See Comments) Double vision  . Simvastatin Other (See Comments)    Muscle aches  . Sulfa Antibiotics Other (See Comments)    unknown  . Vesicare [Solifenacin] Other (See Comments)    unknown  . Butazolidin [Phenylbutazone] Swelling and Rash  . Clindamycin/Lincomycin Other (See  Comments)    Upset stomach  . Food Other (See Comments)    No spicy foods or black pepper, raw onions - causes gerd  . Ibuprofen Hives and Rash  . Lincomycin Hcl Other (See Comments)    Upset stomach  . Penicillins Rash    Has patient had a PCN reaction causing immediate rash, facial/tongue/throat swelling, SOB or lightheadedness with hypotension: Yes Has patient had a PCN reaction causing severe rash involving mucus membranes or skin necrosis: No Has patient had a PCN reaction that required hospitalization No Has patient had a PCN reaction occurring within the last 10 years: No If all of the above answers are "NO", then may proceed with Cephalosporin use.   Lisbeth Ply [Fesoterodine Fumarate Er] Other (See Comments)    unknown  . Voltaren [Diclofenac Sodium] Rash    Family History  Problem Relation Age of Onset  . Heart attack Mother   . CAD Mother   . Obesity Sister   . Cancer Paternal Aunt     breast  . Asthma Neg Hx   . Eczema Neg Hx   . Urticaria Neg Hx   . Allergic rhinitis Neg Hx   . Angioedema Neg Hx   . Atopy Neg Hx   . Immunodeficiency Neg Hx       Prior to Admission medications   Medication Sig Start Date End Date Taking? Authorizing Provider  acetaminophen (TYLENOL) 500 MG tablet Take 1,000 mg by mouth 2 (two) times daily.    Yes Historical Provider, MD  Albuterol Sulfate (PROAIR RESPICLICK IN) Inhale 2 puffs into the lungs every 6 (six) hours as needed (for shortness of breath).    Yes Historical Provider, MD  aspirin 325 MG tablet Take 325 mg by mouth every morning.    Yes Historical Provider, MD  Biotin 5000 MCG CAPS Take 5,000 mcg by mouth every evening.    Yes Historical Provider, MD  calcium carbonate (OS-CAL) 600 MG TABS tablet Take 600 mg by mouth daily with breakfast.   Yes Historical Provider, MD  Coenzyme Q10 (COQ-10) 100 MG CAPS Take 100 mg by mouth at bedtime.    Yes Historical Provider, MD  donepezil (ARICEPT) 10 MG tablet Take 10 mg by mouth at  bedtime.   Yes Historical Provider, MD  famotidine (PEPCID) 40 MG tablet Take 20-40 mg by mouth 2 (two) times daily. 20 mg every morning and 40 mg every evening   Yes Historical Provider, MD  fluticasone (FLONASE) 50 MCG/ACT nasal spray 1-2 sprays in each nostril once daily for stuffy nose. 11/12/15  Yes Charlies Silvers, MD  isosorbide mononitrate (IMDUR) 60 MG 24 hr tablet Take 1 tablet (60 mg total) by mouth daily. Patient taking differently: Take 60 mg by mouth every  morning.  06/14/15  Yes Almyra Deforest, PA  loratadine (CLARITIN) 10 MG tablet Take 10 mg by mouth daily.   Yes Historical Provider, MD  LORazepam (ATIVAN) 1 MG tablet Take 1 mg by mouth at bedtime.  11/08/15  Yes Historical Provider, MD  metoprolol tartrate (LOPRESSOR) 25 MG tablet Take 1 tablet (25 mg total) by mouth 2 (two) times daily. Patient taking differently: Take 12.5-25 mg by mouth 2 (two) times daily. 12.5 mg in the morning and 25 mg in the evening 06/14/15  Yes Almyra Deforest, PA  mirabegron ER (MYRBETRIQ) 25 MG TB24 tablet Take 25 mg by mouth every morning.    Yes Historical Provider, MD  montelukast (SINGULAIR) 10 MG tablet Take 10 mg by mouth at bedtime.    Yes Historical Provider, MD  Multiple Vitamin (MULITIVITAMIN WITH MINERALS) TABS Take 1 tablet by mouth daily.   Yes Historical Provider, MD  nitroGLYCERIN (NITROSTAT) 0.4 MG SL tablet Place 0.4 mg under the tongue every 5 (five) minutes as needed for chest pain. Reported on 11/12/2015   Yes Historical Provider, MD  Polyethyl Glycol-Propyl Glycol (SYSTANE OP) Apply 1 drop to eye daily as needed (for dry eyes).   Yes Historical Provider, MD  polyethylene glycol (MIRALAX / GLYCOLAX) packet Take 8.5 g by mouth at bedtime.    Yes Historical Provider, MD  rosuvastatin (CRESTOR) 5 MG tablet Take 1 tablet (5 mg total) by mouth daily at 6 PM. 06/14/15  Yes Almyra Deforest, PA  sertraline (ZOLOFT) 25 MG tablet Take 50 mg by mouth at bedtime.    Yes Historical Provider, MD  NON FORMULARY Take 30 mLs by  mouth daily. Reported on 11/25/2015    Historical Provider, MD    Physical Exam: Filed Vitals:   11/25/15 2330 11/25/15 2345 11/26/15 0030 11/26/15 0100  BP: 179/68 194/66 166/63 158/58  Pulse: 71 64 61 60  Temp:      TempSrc:      Resp: '16 14 14 9  ' Height:      Weight:      SpO2: 99% 97% 97% 97%      Constitutional: NAD, calm, comfortable Eyes: PERRL, lids and conjunctivae normal ENMT: Mucous membranes are moist. Posterior pharynx clear of any exudate or lesions.Normal dentition.  Neck: normal, supple, no masses, no thyromegaly Respiratory: clear to auscultation bilaterally, no wheezing, no crackles. Normal respiratory effort. No accessory muscle use.  Cardiovascular: Regular rate and rhythm, no murmurs / rubs / gallops. No extremity edema. 2+ pedal pulses. No carotid bruits.  Abdomen: no tenderness, no masses palpated. No hepatosplenomegaly. Bowel sounds positive.  Musculoskeletal: no clubbing / cyanosis. No joint deformity upper and lower extremities. Good ROM, no contractures. Normal muscle tone.  Skin: no rashes, lesions, ulcers. No induration Neurologic: CN 2-12 grossly intact. Sensation intact, DTR normal. Strength 5/5 in all 4.  Psychiatric: Normal judgment and insight. Alert and oriented x 3. Normal mood.    Labs on Admission: I have personally reviewed following labs and imaging studies  CBC:  Recent Labs Lab 11/25/15 2124  WBC 3.1*  HGB 10.9*  HCT 33.1*  MCV 97.4  PLT 384   Basic Metabolic Panel:  Recent Labs Lab 11/25/15 2124  NA 133*  K 3.3*  CL 99*  CO2 26  GLUCOSE 97  BUN 16  CREATININE 0.89  CALCIUM 9.3   GFR: Estimated Creatinine Clearance: 35.5 mL/min (by C-G formula based on Cr of 0.89). Liver Function Tests: No results for input(s): AST, ALT, ALKPHOS, BILITOT, PROT,  ALBUMIN in the last 168 hours. No results for input(s): LIPASE, AMYLASE in the last 168 hours. No results for input(s): AMMONIA in the last 168 hours. Coagulation  Profile: No results for input(s): INR, PROTIME in the last 168 hours. Cardiac Enzymes: No results for input(s): CKTOTAL, CKMB, CKMBINDEX, TROPONINI in the last 168 hours. BNP (last 3 results) No results for input(s): PROBNP in the last 8760 hours. HbA1C: No results for input(s): HGBA1C in the last 72 hours. CBG: No results for input(s): GLUCAP in the last 168 hours. Lipid Profile: No results for input(s): CHOL, HDL, LDLCALC, TRIG, CHOLHDL, LDLDIRECT in the last 72 hours. Thyroid Function Tests: No results for input(s): TSH, T4TOTAL, FREET4, T3FREE, THYROIDAB in the last 72 hours. Anemia Panel: No results for input(s): VITAMINB12, FOLATE, FERRITIN, TIBC, IRON, RETICCTPCT in the last 72 hours. Urine analysis:    Component Value Date/Time   COLORURINE YELLOW 11/25/2015 2157   APPEARANCEUR CLEAR 11/25/2015 2157   LABSPEC 1.010 11/25/2015 2157   PHURINE 7.5 11/25/2015 2157   GLUCOSEU NEGATIVE 11/25/2015 2157   HGBUR NEGATIVE 11/25/2015 2157   BILIRUBINUR NEGATIVE 11/25/2015 2157   KETONESUR NEGATIVE 11/25/2015 2157   PROTEINUR NEGATIVE 11/25/2015 2157   UROBILINOGEN 0.2 06/15/2014 1450   NITRITE NEGATIVE 11/25/2015 2157   LEUKOCYTESUR NEGATIVE 11/25/2015 2157   Sepsis Labs: '@LABRCNTIP' (procalcitonin:4,lacticidven:4) )No results found for this or any previous visit (from the past 240 hour(s)).   Radiological Exams on Admission: Dg Chest 2 View  11/25/2015  CLINICAL DATA:  Initial evaluation for acute chest pain. EXAM: CHEST  2 VIEW COMPARISON:  Prior radiograph from 10/21/2015. FINDINGS: Median sternotomy wires with underlying CABG markers and surgical clips noted, stable. Cardiac and mediastinal silhouettes within normal limits. Atheromatous plaque within the aortic arch. Lungs are normally inflated. No focal infiltrate, pulmonary edema, or pleural effusion. No pneumothorax. No acute osseous abnormality. IMPRESSION: 1. No active cardiopulmonary disease. 2. Sequela of prior CABG.  Electronically Signed   By: Jeannine Boga M.D.   On: 11/25/2015 22:26    EKG: Independently reviewed.  Assessment/Plan Active Problems:   Chest pain   CP -  Admitting for ruleout  CP ops pathway  Serial trops  NPO  Need to call cards back in AM to decide on next step.  Did have neg stress test 10 months ago, but is higher risk with h/o CABG.   DVT prophylaxis: Lovenox Code Status: DNR (yellow form at bedside) Family Communication: No family in room Consults called: EDP spoke with Dr. Wynonia Lawman but they want [name] to admit and call them in AM Admission status: Admit to [place] Pager [phone] from 7PM-7AM  If 7AM-7PM, please contact the day physician for the patient [email or web address] Password [email or web address]  11/26/2015, 1:37 AM

## 2015-12-03 DIAGNOSIS — G479 Sleep disorder, unspecified: Secondary | ICD-10-CM | POA: Diagnosis not present

## 2015-12-03 DIAGNOSIS — G309 Alzheimer's disease, unspecified: Secondary | ICD-10-CM | POA: Diagnosis not present

## 2015-12-03 DIAGNOSIS — R634 Abnormal weight loss: Secondary | ICD-10-CM | POA: Diagnosis not present

## 2015-12-03 DIAGNOSIS — F0150 Vascular dementia without behavioral disturbance: Secondary | ICD-10-CM | POA: Diagnosis not present

## 2015-12-03 DIAGNOSIS — F411 Generalized anxiety disorder: Secondary | ICD-10-CM | POA: Diagnosis not present

## 2015-12-03 DIAGNOSIS — Z9181 History of falling: Secondary | ICD-10-CM | POA: Diagnosis not present

## 2015-12-03 DIAGNOSIS — F0280 Dementia in other diseases classified elsewhere without behavioral disturbance: Secondary | ICD-10-CM | POA: Diagnosis not present

## 2015-12-03 DIAGNOSIS — I10 Essential (primary) hypertension: Secondary | ICD-10-CM | POA: Diagnosis not present

## 2015-12-04 DIAGNOSIS — H02401 Unspecified ptosis of right eyelid: Secondary | ICD-10-CM | POA: Diagnosis not present

## 2015-12-04 DIAGNOSIS — H02402 Unspecified ptosis of left eyelid: Secondary | ICD-10-CM | POA: Diagnosis not present

## 2015-12-08 DIAGNOSIS — R030 Elevated blood-pressure reading, without diagnosis of hypertension: Secondary | ICD-10-CM | POA: Diagnosis not present

## 2015-12-12 DIAGNOSIS — R498 Other voice and resonance disorders: Secondary | ICD-10-CM | POA: Diagnosis not present

## 2015-12-12 DIAGNOSIS — R41841 Cognitive communication deficit: Secondary | ICD-10-CM | POA: Diagnosis not present

## 2015-12-12 DIAGNOSIS — G3184 Mild cognitive impairment, so stated: Secondary | ICD-10-CM | POA: Diagnosis not present

## 2015-12-13 DIAGNOSIS — R498 Other voice and resonance disorders: Secondary | ICD-10-CM | POA: Diagnosis not present

## 2015-12-13 DIAGNOSIS — R41841 Cognitive communication deficit: Secondary | ICD-10-CM | POA: Diagnosis not present

## 2015-12-13 DIAGNOSIS — G3184 Mild cognitive impairment, so stated: Secondary | ICD-10-CM | POA: Diagnosis not present

## 2015-12-24 ENCOUNTER — Ambulatory Visit: Payer: PPO | Admitting: Pediatrics

## 2015-12-25 DIAGNOSIS — G3184 Mild cognitive impairment, so stated: Secondary | ICD-10-CM | POA: Diagnosis not present

## 2015-12-25 DIAGNOSIS — R498 Other voice and resonance disorders: Secondary | ICD-10-CM | POA: Diagnosis not present

## 2015-12-25 DIAGNOSIS — R41841 Cognitive communication deficit: Secondary | ICD-10-CM | POA: Diagnosis not present

## 2015-12-26 DIAGNOSIS — R159 Full incontinence of feces: Secondary | ICD-10-CM | POA: Diagnosis not present

## 2015-12-26 DIAGNOSIS — R498 Other voice and resonance disorders: Secondary | ICD-10-CM | POA: Diagnosis not present

## 2015-12-26 DIAGNOSIS — G3184 Mild cognitive impairment, so stated: Secondary | ICD-10-CM | POA: Diagnosis not present

## 2015-12-26 DIAGNOSIS — R41841 Cognitive communication deficit: Secondary | ICD-10-CM | POA: Diagnosis not present

## 2015-12-26 DIAGNOSIS — R197 Diarrhea, unspecified: Secondary | ICD-10-CM | POA: Diagnosis not present

## 2015-12-27 DIAGNOSIS — R197 Diarrhea, unspecified: Secondary | ICD-10-CM | POA: Diagnosis not present

## 2015-12-27 DIAGNOSIS — N83201 Unspecified ovarian cyst, right side: Secondary | ICD-10-CM | POA: Diagnosis not present

## 2015-12-27 DIAGNOSIS — R159 Full incontinence of feces: Secondary | ICD-10-CM | POA: Diagnosis not present

## 2015-12-28 ENCOUNTER — Telehealth: Payer: Self-pay | Admitting: Cardiology

## 2015-12-28 DIAGNOSIS — G309 Alzheimer's disease, unspecified: Secondary | ICD-10-CM | POA: Diagnosis not present

## 2015-12-28 DIAGNOSIS — F0280 Dementia in other diseases classified elsewhere without behavioral disturbance: Secondary | ICD-10-CM | POA: Diagnosis not present

## 2015-12-28 DIAGNOSIS — F411 Generalized anxiety disorder: Secondary | ICD-10-CM | POA: Diagnosis not present

## 2015-12-28 DIAGNOSIS — M5416 Radiculopathy, lumbar region: Secondary | ICD-10-CM | POA: Diagnosis not present

## 2015-12-28 DIAGNOSIS — F329 Major depressive disorder, single episode, unspecified: Secondary | ICD-10-CM | POA: Diagnosis not present

## 2015-12-28 DIAGNOSIS — G609 Hereditary and idiopathic neuropathy, unspecified: Secondary | ICD-10-CM | POA: Diagnosis not present

## 2015-12-28 DIAGNOSIS — F0150 Vascular dementia without behavioral disturbance: Secondary | ICD-10-CM | POA: Diagnosis not present

## 2015-12-28 DIAGNOSIS — G479 Sleep disorder, unspecified: Secondary | ICD-10-CM | POA: Diagnosis not present

## 2015-12-28 NOTE — Telephone Encounter (Signed)
Records received from [facility] for apt on 01/16/16 with Dr Percival Spanish given to [employer] in Medical Records CN

## 2015-12-31 DIAGNOSIS — G3184 Mild cognitive impairment, so stated: Secondary | ICD-10-CM | POA: Diagnosis not present

## 2015-12-31 DIAGNOSIS — R41841 Cognitive communication deficit: Secondary | ICD-10-CM | POA: Diagnosis not present

## 2015-12-31 DIAGNOSIS — R498 Other voice and resonance disorders: Secondary | ICD-10-CM | POA: Diagnosis not present

## 2016-01-01 DIAGNOSIS — F411 Generalized anxiety disorder: Secondary | ICD-10-CM | POA: Diagnosis not present

## 2016-01-03 DIAGNOSIS — K219 Gastro-esophageal reflux disease without esophagitis: Secondary | ICD-10-CM | POA: Diagnosis not present

## 2016-01-03 DIAGNOSIS — R197 Diarrhea, unspecified: Secondary | ICD-10-CM | POA: Diagnosis not present

## 2016-01-03 DIAGNOSIS — I2510 Atherosclerotic heart disease of native coronary artery without angina pectoris: Secondary | ICD-10-CM | POA: Diagnosis not present

## 2016-01-03 DIAGNOSIS — I10 Essential (primary) hypertension: Secondary | ICD-10-CM | POA: Diagnosis not present

## 2016-01-03 DIAGNOSIS — M4800 Spinal stenosis, site unspecified: Secondary | ICD-10-CM | POA: Diagnosis not present

## 2016-01-03 DIAGNOSIS — M353 Polymyalgia rheumatica: Secondary | ICD-10-CM | POA: Diagnosis not present

## 2016-01-03 DIAGNOSIS — C9001 Multiple myeloma in remission: Secondary | ICD-10-CM | POA: Diagnosis not present

## 2016-01-07 ENCOUNTER — Ambulatory Visit: Payer: PPO | Admitting: Pediatrics

## 2016-01-07 ENCOUNTER — Encounter: Payer: Self-pay | Admitting: Pediatrics

## 2016-01-07 ENCOUNTER — Ambulatory Visit (INDEPENDENT_AMBULATORY_CARE_PROVIDER_SITE_OTHER): Payer: PPO | Admitting: Pediatrics

## 2016-01-07 VITALS — BP 120/60 | HR 60 | Temp 97.2°F | Resp 16

## 2016-01-07 DIAGNOSIS — Z79899 Other long term (current) drug therapy: Secondary | ICD-10-CM | POA: Diagnosis not present

## 2016-01-07 DIAGNOSIS — J4530 Mild persistent asthma, uncomplicated: Secondary | ICD-10-CM

## 2016-01-07 DIAGNOSIS — J3089 Other allergic rhinitis: Secondary | ICD-10-CM

## 2016-01-07 DIAGNOSIS — K219 Gastro-esophageal reflux disease without esophagitis: Secondary | ICD-10-CM

## 2016-01-07 DIAGNOSIS — C9001 Multiple myeloma in remission: Secondary | ICD-10-CM | POA: Diagnosis not present

## 2016-01-07 NOTE — Progress Notes (Signed)
[place] [address] Dept: [phone]  FOLLOW UP NOTE  Patient ID: [name], female    DOB: 07-Dec-1931  Age: 82 y.o. MRN: [id number] Date of Office Visit: 01/07/2016  Assessment  Chief Complaint: Nasal Congestion (CLARITIN AND FLONASE HELP WITH RUNNY NOSE.)  HPI [name] presents for follow-up of her allergic rhinitis and asthma . Her nasal congestion is well controlled. She has not had to use Proair.  Current medications Claritin 10 mg once a day, fluticasone 1 spray per nostril twice a day, montelukast 10 mg once a day and Pro-air RespiClick 2 puffs every 4 hours if needed. Her other medications are outlined in the chart   Drug Allergies:  Allergies  Allergen Reactions  . Butazolidin [Phenylbutazone] Swelling and Rash  . Cephalosporins Other (See Comments)    unknown  . Codeine Nausea Only  . Levaquin [Levofloxacin] Shortness Of Breath  . Macrodantin Other (See Comments)    Double vision  . Morphine And Related Other (See Comments)    hallucinations  . Trimethoprim Nausea Only  . Cilostazol Other (See Comments)    Stomach upset  . Clindamycin Other (See Comments)    diarrhea  . Dexilant [Dexlansoprazole] Diarrhea  . Erythromycin Nausea And Vomiting    All "mycin" drugs  . Morphine   . Nitrofurantoin     Other reaction(s): Other (See Comments) Double vision Other reaction(s): Other (See Comments) Double vision  . Simvastatin Other (See Comments)    Muscle aches Muscle aches  . Sulfa Antibiotics Other (See Comments)    unknown  . Vesicare [Solifenacin] Other (See Comments)    unknown  . Clindamycin/Lincomycin Other (See Comments)    Upset stomach  . Food Other (See Comments)    No spicy foods or black pepper, raw onions - causes gerd  . Ibuprofen Hives and Rash  . Lincomycin Hcl Other (See Comments)    Upset stomach  . Penicillins Rash    Has patient had a PCN reaction causing immediate rash, facial/tongue/throat  swelling, SOB or lightheadedness with hypotension: Yes Has patient had a PCN reaction causing severe rash involving mucus membranes or skin necrosis: No Has patient had a PCN reaction that required hospitalization No Has patient had a PCN reaction occurring within the last 10 years: No If all of the above answers are "NO", then may proceed with Cephalosporin use.   Lisbeth Ply [Fesoterodine Fumarate Er] Other (See Comments)    unknown  . Voltaren [Diclofenac Sodium] Rash    Physical Exam: BP 120/60 (BP Location: Left Arm, Patient Position: Sitting)   Pulse 60   Temp 97.2 F (36.2 C) (Tympanic)   Resp 16    Physical Exam  Constitutional: She is oriented to person, place, and time. She appears well-developed and well-nourished.  HENT:  Eyes normal. Ears normal. Nose normal. Pharynx normal.  Neck: Neck supple.  Cardiovascular:  S1 and S2 normal no murmurs  Pulmonary/Chest:  Clear to percussion and auscultation  Lymphadenopathy:    She has no cervical adenopathy.  Neurological: She is alert and oriented to person, place, and time.  Skin:  clear  Psychiatric: She has a normal mood and affect. Her behavior is normal. Judgment and thought content normal.  Vitals reviewed.   Diagnostics:  FVC 2.64 L FEV1 2.00 L. Predicted FVC 2.26 L predicted FEV1 1.67 L-the spirometry is in the normal range  Assessment and Plan: 1. Mild persistent asthma, uncomplicated   2. Other allergic rhinitis  3. Current use of beta blocker   4. Gastroesophageal reflux disease without esophagitis   5. Multiple myeloma in remission [facility])       Patient Instructions  Continue on the treatment plan outlined above Call me if you are not doing well on this treatment plan   Return in about 1 year (around 01/06/2017).    Thank you for the opportunity to care for this patient.  Please do not hesitate to contact me with questions.  Penne Lash, M.D.  [facility] [address] [place], [place] [address] [phone]

## 2016-01-07 NOTE — Patient Instructions (Addendum)
Continue on the treatment plan outlined above Call me if you are not doing well on this treatment plan

## 2016-01-08 DIAGNOSIS — R41841 Cognitive communication deficit: Secondary | ICD-10-CM | POA: Diagnosis not present

## 2016-01-08 DIAGNOSIS — G3184 Mild cognitive impairment, so stated: Secondary | ICD-10-CM | POA: Diagnosis not present

## 2016-01-10 DIAGNOSIS — R41841 Cognitive communication deficit: Secondary | ICD-10-CM | POA: Diagnosis not present

## 2016-01-10 DIAGNOSIS — G3184 Mild cognitive impairment, so stated: Secondary | ICD-10-CM | POA: Diagnosis not present

## 2016-01-10 DIAGNOSIS — K219 Gastro-esophageal reflux disease without esophagitis: Secondary | ICD-10-CM | POA: Diagnosis not present

## 2016-01-14 DIAGNOSIS — F411 Generalized anxiety disorder: Secondary | ICD-10-CM | POA: Diagnosis not present

## 2016-01-14 DIAGNOSIS — K219 Gastro-esophageal reflux disease without esophagitis: Secondary | ICD-10-CM | POA: Diagnosis not present

## 2016-01-14 DIAGNOSIS — I2510 Atherosclerotic heart disease of native coronary artery without angina pectoris: Secondary | ICD-10-CM | POA: Diagnosis not present

## 2016-01-14 DIAGNOSIS — R197 Diarrhea, unspecified: Secondary | ICD-10-CM | POA: Diagnosis not present

## 2016-01-15 ENCOUNTER — Encounter: Payer: Self-pay | Admitting: Cardiology

## 2016-01-15 NOTE — Progress Notes (Deleted)
Cardiology Office Note   Date:  01/15/2016   ID:  [name], DOB [birth year], MRN [id number]  PCP:  Lucille Passy, MD  Cardiologist:   Minus Breeding, MD   No chief complaint on file.     History of Present Illness: [name] is a 82 y.o. female who presents for ***  The patient presents for evaluation having recently seen Dr. Tamala Julian and Dr. Elonda Husky in [facility].    She has had many hospital visits.  Many of these have been for evaluation of chest pain.  I reviewed the most recent visits.  She was discharged in June with chest pain.  This was not felt to be anginal.  Her last work up included a ***   Past Medical History:  Diagnosis Date  . Allergic rhinitis   . Anginal pain ([place])   . Anxiety   . Arthritis   . Asthma   . Atypical chest pain 04/25/13   Doing better, does occasionally respond to Nitroglycerin  . Coronary artery disease   . Coronary atherosclerosis of autologous vein bypass graft   . Coronary atherosclerosis of unspecified type of vessel, native or graft   . GERD (gastroesophageal reflux disease)   . Hx of CABG   . Hypercholesteremia   . Hyperlipidemia    GOAL LDL <70  . Hypertension   . IBS (irritable bowel syndrome)   . Incontinent of urine   . Intermediate coronary syndrome ([place])   . MI (myocardial infarction) ([place])   . Mild chronic anemia   . Multiple myeloma (H[facility])    Dr. Benay Spice  . Osteopenia   . Ovarian cyst, bilateral    Rt complex  . Overactive bladder   . Panic disorder   . Raynauds syndrome   . Shortness of breath   . Spinal stenosis of lumbar region   . Urine test positive for microalbuminuria 12/2011    Past Surgical History:  Procedure Laterality Date  . BUNIONECTOMY WITH HAMMERTOE RECONSTRUCTION Right 08/04/2012   Procedure:  HAMMERTOE RECONSTRUCTION;  Surgeon: Colin Rhein, MD;  Location: [place];  Service: Orthopedics;  Laterality: Right;  HAMMER TOE RECONSTRUCTION PROBABLE FLEXOR DIGITORUM  LONGUS TO PROXIMAL PHALANX TRANSFER LATERALIZED   . CAPSULOTOMY Right 08/04/2012   Procedure: CAPSULOTOMY;  Surgeon: Colin Rhein, MD;  Location: [place];  Service: Orthopedics;  Laterality: Right;  RIGHT 2ND TOE METATARSOPHALANGEAL JOINT DORSAL CAPSULOTOMY   . CATARACT EXTRACTION, BILATERAL    . COLONOSCOPY    . CORONARY ANGIOPLASTY WITH STENT PLACEMENT  2014   2 stents  . CORONARY ARTERY BYPASS GRAFT  2007   LIMA to LAD, SVG to RCA  non-ST segment MI 08/2009 (Dr. Tamala Julian)  . DILATION AND CURETTAGE OF UTERUS    . EYE SURGERY     both cataracts  . HAMMER TOE SURGERY Right 08/2012  . LEFT HEART CATHETERIZATION WITH CORONARY/GRAFT ANGIOGRAM N/A 07/09/2012   Procedure: LEFT HEART CATHETERIZATION WITH Beatrix Fetters;  Surgeon: Sinclair Grooms, MD;  Location: [facility] C[facility] LAB;  Service: Cardiovascular;  Laterality: N/A;  . TONSILLECTOMY AND ADENOIDECTOMY    . UPPER GASTROINTESTINAL ENDOSCOPY       Current Outpatient Prescriptions  Medication Sig Dispense Refill  . acetaminophen (TYLENOL) 500 MG tablet Take 1,000 mg by mouth 2 (two) times daily.     . Albuterol Sulfate (PROAIR RESPICLICK IN) Inhale 2 puffs into the lungs every 6 (six) hours as needed (for shortness  of breath).     Marland Kitchen aspirin 325 MG tablet Take 325 mg by mouth every morning.     . calcium carbonate (OS-CAL) 600 MG TABS tablet Take 600 mg by mouth daily with breakfast.    . cholestyramine (QUESTRAN) 4 g packet Take by mouth.    . Coenzyme Q10 (COQ-10) 100 MG CAPS Take 100 mg by mouth at bedtime.     . donepezil (ARICEPT) 10 MG tablet Take 10 mg by mouth at bedtime.    . famotidine (PEPCID) 40 MG tablet Take 20-40 mg by mouth 2 (two) times daily. 20 mg every morning and 40 mg every evening    . fluticasone (FLONASE) 50 MCG/ACT nasal spray 1-2 sprays in each nostril once daily for stuffy nose. 16 g 5  . isosorbide mononitrate (IMDUR) 60 MG 24 hr tablet Take 1 tablet (60 mg total) by mouth daily. (Patient  taking differently: Take 60 mg by mouth every morning. ) 90 tablet 3  . loratadine (CLARITIN) 10 MG tablet Take 10 mg by mouth daily.    Marland Kitchen LORazepam (ATIVAN) 1 MG tablet Take 1 mg by mouth at bedtime.     . metoprolol tartrate (LOPRESSOR) 25 MG tablet Take 1 tablet (25 mg total) by mouth 2 (two) times daily. (Patient taking differently: Take 12.5-25 mg by mouth 2 (two) times daily. 12.5 mg in the morning and 25 mg in the evening) 180 tablet 3  . mirabegron ER (MYRBETRIQ) 25 MG TB24 tablet Take 25 mg by mouth every morning.     . montelukast (SINGULAIR) 10 MG tablet Take 10 mg by mouth at bedtime.     . Multiple Vitamin (MULITIVITAMIN WITH MINERALS) TABS Take 1 tablet by mouth daily.    . nitroGLYCERIN (NITROSTAT) 0.4 MG SL tablet Place 0.4 mg under the tongue every 5 (five) minutes as needed for chest pain. Reported on 11/12/2015    . NON FORMULARY Take 30 mLs by mouth daily. Reported on 11/25/2015    . Polyethyl Glycol-Propyl Glycol (SYSTANE OP) Apply 1 drop to eye daily as needed (for dry eyes).    . polyethylene glycol (MIRALAX / GLYCOLAX) packet Take 8.5 g by mouth at bedtime.     . rosuvastatin (CRESTOR) 5 MG tablet Take 1 tablet (5 mg total) by mouth daily at 6 PM. 90 tablet 3  . sertraline (ZOLOFT) 25 MG tablet Take 50 mg by mouth at bedtime.     . Sodium Chloride-Sodium Bicarb 1.57 g PACK Place into the nose.     No current facility-administered medications for this visit.     Allergies:   Butazolidin [phenylbutazone]; Cephalosporins; Codeine; Levaquin [levofloxacin]; Macrodantin; Morphine and related; Trimethoprim; Cilostazol; Clindamycin; Dexilant [dexlansoprazole]; Erythromycin; Morphine; Nitrofurantoin; Simvastatin; Sulfa antibiotics; Vesicare [solifenacin]; Clindamycin/lincomycin; Food; Ibuprofen; Lincomycin hcl; Penicillins; Toviaz [fesoterodine fumarate er]; and Voltaren [diclofenac sodium]    Social History:  The patient  reports that she has never smoked. She has never used  smokeless tobacco. She reports that she drinks alcohol. She reports that she does not use drugs.   Family History:  The patient's ***family history includes CAD in her mother; Cancer in her paternal aunt; Heart attack in her mother; Obesity in her sister.    ROS:  Please see the history of present illness.   Otherwise, review of systems are positive for {NONE DEFAULTED:18576::"none"}.   All other systems are reviewed and negative.    PHYSICAL EXAM: VS:  There were no vitals taken for this visit. , BMI There is  no height or weight on file to calculate BMI. GENERAL:  Well appearing HEENT:  Pupils equal round and reactive, fundi not visualized, oral mucosa unremarkable NECK:  No jugular venous distention, waveform within normal limits, carotid upstroke brisk and symmetric, no bruits, no thyromegaly LYMPHATICS:  No cervical, inguinal adenopathy LUNGS:  Clear to auscultation bilaterally BACK:  No CVA tenderness CHEST:  Unremarkable HEART:  PMI not displaced or sustained,S1 and S2 within normal limits, no S3, no S4, no clicks, no rubs, *** murmurs ABD:  Flat, positive bowel sounds normal in frequency in pitch, no bruits, no rebound, no guarding, no midline pulsatile mass, no hepatomegaly, no splenomegaly EXT:  2 plus pulses throughout, no edema, no cyanosis no clubbing SKIN:  No rashes no nodules NEURO:  Cranial nerves II through XII grossly intact, motor grossly intact throughout PSYCH:  Cognitively intact, oriented to person place and time    EKG:  EKG {ACTION; IS/IS WUJ:[id number]} ordered today. The ekg ordered today demonstrates ***   Recent Labs: 06/13/2015: ALT 16 11/25/2015: BUN 16; Creatinine, Ser 0.89; Hemoglobin 10.9; Platelets 213; Potassium 3.3; Sodium 133    Lipid Panel    Component Value Date/Time   CHOL 128 07/09/2012 0105   TRIG 22 07/09/2012 0105   HDL 73 07/09/2012 0105   CHOLHDL 1.8 07/09/2012 0105   VLDL 4 07/09/2012 0105   LDLCALC 51 07/09/2012 0105      Wt  Readings from Last 3 Encounters:  11/26/15 98 lb 8 oz (44.7 kg)  11/12/15 104 lb 12.8 oz (47.5 kg)  10/21/15 103 lb (46.7 kg)      Other studies Reviewed: Additional studies/ records that were reviewed today include: ***. Review of the above records demonstrates:  Please see elsewhere in the note.  ***   ASSESSMENT AND PLAN:  ***   Current medicines are reviewed at length with the patient today.  The patient {ACTIONS; HAS/DOES NOT HAVE:19233} concerns regarding medicines.  The following changes have been made:  {PLAN; NO CHANGE:13088:s}  Labs/ tests ordered today include: *** No orders of the defined types were placed in this encounter.    Disposition:   FU with ***    Signed, Minus Breeding, MD  01/15/2016 2:34 PM    [place]

## 2016-01-16 ENCOUNTER — Institutional Professional Consult (permissible substitution): Payer: Self-pay | Admitting: Cardiology

## 2016-01-16 ENCOUNTER — Telehealth: Payer: Self-pay | Admitting: Cardiology

## 2016-01-18 DIAGNOSIS — R41841 Cognitive communication deficit: Secondary | ICD-10-CM | POA: Diagnosis not present

## 2016-01-18 DIAGNOSIS — G3184 Mild cognitive impairment, so stated: Secondary | ICD-10-CM | POA: Diagnosis not present

## 2016-01-20 NOTE — Progress Notes (Signed)
Cardiology Office Note   Date:  01/21/2016   ID:  [name], DOB July 31, 1931, MRN [id number]  PCP:  Lucille Passy, MD  Cardiologist:   Minus Breeding, MD   Chief Complaint  Patient presents with  . Coronary Artery Disease      History of Present Illness: [name] is a 82 y.o. female who presents for preop evaluation.  She has CAD.   The patient has a long history of CAD.  She has been seen by Dr. Tamala Julian in our practice and Dr. Elonda Husky in [facility].  Dr. Elonda Husky is no longer working in the area and Dr. Tamala Julian discharged the patient from his practice.   She was most recently admitted to the hospital in June with chest pain but this was not felt to be anginal.   I was able to review a stress perfusoin study from [facility] last year and she had no ischemia and an EF of 75%.    She presents for the first time with me.  She actually does not have any specific cardiovascular complaints.  She seems to say now that she knows that many of her symptoms are related to anxiety.  She does yoga once weekly and says that this helps her.  She would like to do this more.  The patient denies any new symptoms such as chest discomfort, neck or arm discomfort. There has been no new shortness of breath, PND or orthopnea. There have been no reported palpitations, presyncope or syncope.  She has stress because her husband is very hard of hearing.   Of note she is going to have out patient eyelid surgery.     Past Medical History:  Diagnosis Date  . Allergic rhinitis   . Anxiety   . Arthritis   . Asthma   . Coronary artery disease   . Coronary atherosclerosis of autologous vein bypass graft   . Coronary atherosclerosis of unspecified type of vessel, native or graft   . GERD (gastroesophageal reflux disease)   . Hx of CABG   . Hypercholesteremia   . Hyperlipidemia    GOAL LDL <70  . Hypertension   . IBS (irritable bowel syndrome)   . Incontinent of urine   . Intermediate coronary  syndrome ([place])   . Mild chronic anemia   . Multiple myeloma (H[facility])    Dr. Benay Spice  . Osteopenia   . Ovarian cyst, bilateral    Rt complex  . Overactive bladder   . Panic disorder   . Raynauds syndrome   . Spinal stenosis of lumbar region   . Urine test positive for microalbuminuria 12/2011    Past Surgical History:  Procedure Laterality Date  . BUNIONECTOMY WITH HAMMERTOE RECONSTRUCTION Right 08/04/2012   Procedure:  HAMMERTOE RECONSTRUCTION;  Surgeon: Colin Rhein, MD;  Location: [place];  Service: Orthopedics;  Laterality: Right;  HAMMER TOE RECONSTRUCTION PROBABLE FLEXOR DIGITORUM LONGUS TO PROXIMAL PHALANX TRANSFER LATERALIZED   . CAPSULOTOMY Right 08/04/2012   Procedure: CAPSULOTOMY;  Surgeon: Colin Rhein, MD;  Location: [place];  Service: Orthopedics;  Laterality: Right;  RIGHT 2ND TOE METATARSOPHALANGEAL JOINT DORSAL CAPSULOTOMY   . CATARACT EXTRACTION, BILATERAL    . COLONOSCOPY    . CORONARY ANGIOPLASTY WITH STENT PLACEMENT  2014   2 stents  . CORONARY ARTERY BYPASS GRAFT  2007   LIMA to LAD, SVG to RCA  non-ST segment MI 08/2009 (Dr. Tamala Julian)  . DILATION AND  CURETTAGE OF UTERUS    . EYE SURGERY     both cataracts  . HAMMER TOE SURGERY Right 08/2012  . LEFT HEART CATHETERIZATION WITH CORONARY/GRAFT ANGIOGRAM N/A 07/09/2012   Procedure: LEFT HEART CATHETERIZATION WITH Beatrix Fetters;  Surgeon: Sinclair Grooms, MD;  Location: [facility] CATH LAB;  Service: Cardiovascular;  Laterality: N/A;  . TONSILLECTOMY AND ADENOIDECTOMY    . UPPER GASTROINTESTINAL ENDOSCOPY       Current Outpatient Prescriptions  Medication Sig Dispense Refill  . acetaminophen (TYLENOL) 500 MG tablet Take 1,000 mg by mouth 2 (two) times daily.     . Albuterol Sulfate (PROAIR RESPICLICK IN) Inhale 2 puffs into the lungs every 6 (six) hours as needed (for shortness of breath).     . calcium carbonate (OS-CAL) 600 MG TABS tablet Take 600 mg by mouth daily with  breakfast.    . Coenzyme Q10 (COQ-10) 100 MG CAPS Take 100 mg by mouth at bedtime.     . donepezil (ARICEPT) 10 MG tablet Take 10 mg by mouth at bedtime.    . famotidine (PEPCID) 40 MG tablet Take 20-40 mg by mouth 2 (two) times daily. 20 mg every morning and 40 mg every evening    . fluticasone (FLONASE) 50 MCG/ACT nasal spray 1-2 sprays in each nostril once daily for stuffy nose. 16 g 5  . isosorbide mononitrate (IMDUR) 60 MG 24 hr tablet Take 1 tablet (60 mg total) by mouth daily. (Patient taking differently: Take 60 mg by mouth every morning. ) 90 tablet 3  . loratadine (CLARITIN) 10 MG tablet Take 10 mg by mouth daily.    Marland Kitchen LORazepam (ATIVAN) 1 MG tablet Take 1 mg by mouth at bedtime.     . metoprolol tartrate (LOPRESSOR) 25 MG tablet Take by mouth. Take half tab (12.'5mg'$ ) in the morning and 1 tab ('25mg'$ ) in the evening    . mirabegron ER (MYRBETRIQ) 25 MG TB24 tablet Take 25 mg by mouth every morning.     . montelukast (SINGULAIR) 10 MG tablet Take 10 mg by mouth at bedtime.     . Multiple Vitamin (MULITIVITAMIN WITH MINERALS) TABS Take 1 tablet by mouth daily.    . nitroGLYCERIN (NITROSTAT) 0.4 MG SL tablet Place 0.4 mg under the tongue every 5 (five) minutes as needed for chest pain. Reported on 11/12/2015    . NON FORMULARY Take 30 mLs by mouth daily. Reported on 11/25/2015    . Polyethyl Glycol-Propyl Glycol (SYSTANE OP) Apply 1 drop to eye daily as needed (for dry eyes).    . polyethylene glycol (MIRALAX / GLYCOLAX) packet Take 8.5 g by mouth at bedtime.     . rosuvastatin (CRESTOR) 5 MG tablet Take 1 tablet (5 mg total) by mouth daily at 6 PM. 90 tablet 3  . sertraline (ZOLOFT) 25 MG tablet Take 75 mg by mouth at bedtime.     Marland Kitchen aspirin EC 81 MG tablet Take 1 tablet (81 mg total) by mouth daily. 90 tablet 3  . Sodium Chloride-Sodium Bicarb 1.57 g PACK Place into the nose.     No current facility-administered medications for this visit.      Family History:  The patient's family history  includes CAD in her mother; Cancer in her paternal aunt; Heart attack in her mother; Obesity in her sister.    ROS:  Please see the history of present illness.   Otherwise, review of systems are positive for none.   All other systems are reviewed and  negative.    PHYSICAL EXAM: VS:  BP (!) 110/50 (BP Location: Left Arm, Patient Position: Sitting, Cuff Size: Small) Comment (Cuff Size): pediatric cuff  Ht '5\' 3"'$  (1.6 m)   Wt 96 lb 4 oz (43.7 kg)   BMI 17.05 kg/m  , BMI Body mass index is 17.05 kg/m. GENERAL:  Well appearing but looks her stated age. HEENT:  Pupils equal round and reactive, fundi not visualized, oral mucosa unremarkable NECK:  No jugular venous distention, waveform within normal limits, carotid upstroke brisk and symmetric, no bruits, no thyromegaly LYMPHATICS:  No cervical, inguinal adenopathy LUNGS:  Clear to auscultation bilaterally BACK:  No CVA tenderness CHEST:  Unremarkable HEART:  PMI not displaced or sustained,S1 and S2 within normal limits, no S3, no S4, no clicks, no rubs, no murmurs ABD:  Flat, positive bowel sounds normal in frequency in pitch, no bruits, no rebound, no guarding, no midline pulsatile mass, no hepatomegaly, no splenomegaly EXT:  2 plus pulses throughout, no edema, no cyanosis no clubbing SKIN:  No rashes no nodules NEURO:  Cranial nerves II through XII grossly intact, motor grossly intact throughout PSYCH:  Cognitively intact, oriented to person place and time    EKG:  EKG is not ordered today.   Recent Labs: 06/13/2015: ALT 16 11/25/2015: BUN 16; Creatinine, Ser 0.89; Hemoglobin 10.9; Platelets 213; Potassium 3.3; Sodium 133    Lipid Panel    Component Value Date/Time   CHOL 128 07/09/2012 0105   TRIG 22 07/09/2012 0105   HDL 73 07/09/2012 0105   CHOLHDL 1.8 07/09/2012 0105   VLDL 4 07/09/2012 0105   LDLCALC 51 07/09/2012 0105      Wt Readings from Last 3 Encounters:  01/21/16 96 lb 4 oz (43.7 kg)  11/26/15 98 lb 8 oz (44.7  kg)  11/12/15 104 lb 12.8 oz (47.5 kg)      Other studies Reviewed: Additional studies/ records that were reviewed today include: [place] cardiology records.. Review of the above records demonstrates:  Please see elsewhere in the note.     ASSESSMENT AND PLAN:  Atherosclerosis of coronary artery bypass graft of native heart without angina pectoris The patient has no new sypmtoms.  No further cardiovascular testing is indicated.  We will continue with aggressive risk reduction and meds as listed.  She can reduce her ASA to 81 mg.   Essential hypertension The blood pressure is at target. No change in medications is indicated. We will continue with therapeutic lifestyle changes (TLC).  Hyperlipidemia This is followed by Lucille Passy, MD.   I will defer to their management.    Multiple somatic complaints She actually seems to be doing quite well with this.  We talked at great length about stress reduction and exercise.  I gave her specific instruction about mindfulness exercises.    Preop The patient is not going for a high risk procedure.  There are no high risk findings or symptoms.  Therefore, based on ACC/AHA guidelines, the patient would be at acceptable risk for the planned procedure without further cardiovascular testing.    Current medicines are reviewed at length with the patient today.  The patient does not have concerns regarding medicines.  The following changes have been made:  As above  Labs/ tests ordered today include: None No orders of the defined types were placed in this encounter.    Disposition:   FU with me in six months.     Signed, Minus Breeding, MD  01/21/2016 1:03  PM    [facility]

## 2016-01-21 ENCOUNTER — Encounter: Payer: Self-pay | Admitting: Cardiology

## 2016-01-21 ENCOUNTER — Telehealth: Payer: Self-pay | Admitting: Cardiology

## 2016-01-21 ENCOUNTER — Ambulatory Visit (INDEPENDENT_AMBULATORY_CARE_PROVIDER_SITE_OTHER): Payer: PPO | Admitting: Cardiology

## 2016-01-21 VITALS — BP 110/50 | Ht 63.0 in | Wt 96.2 lb

## 2016-01-21 DIAGNOSIS — Z01810 Encounter for preprocedural cardiovascular examination: Secondary | ICD-10-CM

## 2016-01-21 DIAGNOSIS — I25810 Atherosclerosis of coronary artery bypass graft(s) without angina pectoris: Secondary | ICD-10-CM | POA: Diagnosis not present

## 2016-01-21 DIAGNOSIS — I10 Essential (primary) hypertension: Secondary | ICD-10-CM

## 2016-01-21 DIAGNOSIS — R41841 Cognitive communication deficit: Secondary | ICD-10-CM | POA: Diagnosis not present

## 2016-01-21 DIAGNOSIS — G3184 Mild cognitive impairment, so stated: Secondary | ICD-10-CM | POA: Diagnosis not present

## 2016-01-21 MED ORDER — ASPIRIN EC 81 MG PO TBEC: 81.0000 mg | DELAYED_RELEASE_TABLET | Freq: Every day | ORAL | 3 refills | Status: AC

## 2016-01-21 NOTE — Telephone Encounter (Signed)
Pt will be here in a little while to see Dr Warren Lacy. Pt have been diagnosed with mixed Dementia. Pt is in denial of this,please be aware of this and be sure son is included in all conversations and everything that takes place please.

## 2016-01-21 NOTE — Patient Instructions (Signed)
Medication Instructions:   DECREASE ASPIRIN TO 81 MG ONCE DAILY WITH FOOD  Follow-Up:  Your physician wants you to follow-up in: [place] will receive a reminder letter in the mail two months in advance. If you don't receive a letter, please call our office to schedule the follow-up appointment.   If you need a refill on your cardiac medications before your next appointment, please call your pharmacy.

## 2016-01-22 DIAGNOSIS — G3184 Mild cognitive impairment, so stated: Secondary | ICD-10-CM | POA: Diagnosis not present

## 2016-01-22 DIAGNOSIS — R41841 Cognitive communication deficit: Secondary | ICD-10-CM | POA: Diagnosis not present

## 2016-01-24 DIAGNOSIS — H02401 Unspecified ptosis of right eyelid: Secondary | ICD-10-CM | POA: Diagnosis not present

## 2016-01-24 DIAGNOSIS — H02402 Unspecified ptosis of left eyelid: Secondary | ICD-10-CM | POA: Diagnosis not present

## 2016-01-24 DIAGNOSIS — H02403 Unspecified ptosis of bilateral eyelids: Secondary | ICD-10-CM | POA: Diagnosis not present

## 2016-01-25 DIAGNOSIS — R41841 Cognitive communication deficit: Secondary | ICD-10-CM | POA: Diagnosis not present

## 2016-01-25 DIAGNOSIS — G3184 Mild cognitive impairment, so stated: Secondary | ICD-10-CM | POA: Diagnosis not present

## 2016-01-30 DIAGNOSIS — R41841 Cognitive communication deficit: Secondary | ICD-10-CM | POA: Diagnosis not present

## 2016-01-30 DIAGNOSIS — G3184 Mild cognitive impairment, so stated: Secondary | ICD-10-CM | POA: Diagnosis not present

## 2016-01-31 DIAGNOSIS — M25511 Pain in right shoulder: Secondary | ICD-10-CM | POA: Diagnosis not present

## 2016-01-31 DIAGNOSIS — R5383 Other fatigue: Secondary | ICD-10-CM | POA: Diagnosis not present

## 2016-01-31 DIAGNOSIS — I7300 Raynaud's syndrome without gangrene: Secondary | ICD-10-CM | POA: Diagnosis not present

## 2016-01-31 DIAGNOSIS — M353 Polymyalgia rheumatica: Secondary | ICD-10-CM | POA: Diagnosis not present

## 2016-01-31 DIAGNOSIS — M542 Cervicalgia: Secondary | ICD-10-CM | POA: Diagnosis not present

## 2016-01-31 DIAGNOSIS — K580 Irritable bowel syndrome with diarrhea: Secondary | ICD-10-CM | POA: Diagnosis not present

## 2016-02-01 DIAGNOSIS — R41841 Cognitive communication deficit: Secondary | ICD-10-CM | POA: Diagnosis not present

## 2016-02-01 DIAGNOSIS — G3184 Mild cognitive impairment, so stated: Secondary | ICD-10-CM | POA: Diagnosis not present

## 2016-02-06 DIAGNOSIS — G3184 Mild cognitive impairment, so stated: Secondary | ICD-10-CM | POA: Diagnosis not present

## 2016-02-06 DIAGNOSIS — R41841 Cognitive communication deficit: Secondary | ICD-10-CM | POA: Diagnosis not present

## 2016-02-07 DIAGNOSIS — G3184 Mild cognitive impairment, so stated: Secondary | ICD-10-CM | POA: Diagnosis not present

## 2016-02-07 DIAGNOSIS — R41841 Cognitive communication deficit: Secondary | ICD-10-CM | POA: Diagnosis not present

## 2016-02-12 DIAGNOSIS — F0390 Unspecified dementia without behavioral disturbance: Secondary | ICD-10-CM | POA: Diagnosis not present

## 2016-02-12 DIAGNOSIS — R197 Diarrhea, unspecified: Secondary | ICD-10-CM | POA: Diagnosis not present

## 2016-02-12 DIAGNOSIS — K219 Gastro-esophageal reflux disease without esophagitis: Secondary | ICD-10-CM | POA: Diagnosis not present

## 2016-02-13 DIAGNOSIS — R41841 Cognitive communication deficit: Secondary | ICD-10-CM | POA: Diagnosis not present

## 2016-02-13 DIAGNOSIS — C9000 Multiple myeloma not having achieved remission: Secondary | ICD-10-CM | POA: Diagnosis not present

## 2016-02-13 DIAGNOSIS — I10 Essential (primary) hypertension: Secondary | ICD-10-CM | POA: Diagnosis not present

## 2016-02-13 DIAGNOSIS — G3184 Mild cognitive impairment, so stated: Secondary | ICD-10-CM | POA: Diagnosis not present

## 2016-02-13 DIAGNOSIS — R197 Diarrhea, unspecified: Secondary | ICD-10-CM | POA: Diagnosis not present

## 2016-02-15 NOTE — Telephone Encounter (Signed)
Closed encounter °

## 2016-02-18 DIAGNOSIS — R634 Abnormal weight loss: Secondary | ICD-10-CM | POA: Diagnosis not present

## 2016-02-18 DIAGNOSIS — Z23 Encounter for immunization: Secondary | ICD-10-CM | POA: Diagnosis not present

## 2016-02-18 DIAGNOSIS — F0280 Dementia in other diseases classified elsewhere without behavioral disturbance: Secondary | ICD-10-CM | POA: Diagnosis not present

## 2016-02-18 DIAGNOSIS — G309 Alzheimer's disease, unspecified: Secondary | ICD-10-CM | POA: Diagnosis not present

## 2016-02-18 DIAGNOSIS — I10 Essential (primary) hypertension: Secondary | ICD-10-CM | POA: Diagnosis not present

## 2016-02-18 DIAGNOSIS — F0150 Vascular dementia without behavioral disturbance: Secondary | ICD-10-CM | POA: Diagnosis not present

## 2016-02-26 DIAGNOSIS — F411 Generalized anxiety disorder: Secondary | ICD-10-CM | POA: Diagnosis not present

## 2016-02-26 DIAGNOSIS — I209 Angina pectoris, unspecified: Secondary | ICD-10-CM | POA: Diagnosis not present

## 2016-02-26 DIAGNOSIS — I10 Essential (primary) hypertension: Secondary | ICD-10-CM | POA: Diagnosis not present

## 2016-02-29 DIAGNOSIS — L821 Other seborrheic keratosis: Secondary | ICD-10-CM | POA: Diagnosis not present

## 2016-02-29 DIAGNOSIS — D2239 Melanocytic nevi of other parts of face: Secondary | ICD-10-CM | POA: Diagnosis not present

## 2016-02-29 DIAGNOSIS — D692 Other nonthrombocytopenic purpura: Secondary | ICD-10-CM | POA: Diagnosis not present

## 2016-03-06 DIAGNOSIS — G894 Chronic pain syndrome: Secondary | ICD-10-CM | POA: Diagnosis not present

## 2016-03-06 DIAGNOSIS — F339 Major depressive disorder, recurrent, unspecified: Secondary | ICD-10-CM | POA: Diagnosis not present

## 2016-03-06 DIAGNOSIS — I2510 Atherosclerotic heart disease of native coronary artery without angina pectoris: Secondary | ICD-10-CM | POA: Diagnosis not present

## 2016-03-06 DIAGNOSIS — F0390 Unspecified dementia without behavioral disturbance: Secondary | ICD-10-CM | POA: Diagnosis not present

## 2016-03-17 DIAGNOSIS — L0390 Cellulitis, unspecified: Secondary | ICD-10-CM | POA: Diagnosis not present

## 2016-03-24 DIAGNOSIS — M25511 Pain in right shoulder: Secondary | ICD-10-CM | POA: Diagnosis not present

## 2016-03-24 DIAGNOSIS — M542 Cervicalgia: Secondary | ICD-10-CM | POA: Diagnosis not present

## 2016-03-24 DIAGNOSIS — I7300 Raynaud's syndrome without gangrene: Secondary | ICD-10-CM | POA: Diagnosis not present

## 2016-04-08 DIAGNOSIS — H5713 Ocular pain, bilateral: Secondary | ICD-10-CM | POA: Diagnosis not present

## 2016-04-08 DIAGNOSIS — H11823 Conjunctivochalasis, bilateral: Secondary | ICD-10-CM | POA: Diagnosis not present

## 2016-04-18 DIAGNOSIS — N3946 Mixed incontinence: Secondary | ICD-10-CM | POA: Diagnosis not present

## 2016-04-18 DIAGNOSIS — R350 Frequency of micturition: Secondary | ICD-10-CM | POA: Diagnosis not present

## 2016-05-05 ENCOUNTER — Telehealth: Payer: Self-pay | Admitting: *Deleted

## 2016-05-05 NOTE — Telephone Encounter (Signed)
Spoke to patient who we saw back in July. Pt. Stated in the past 4 days used the samples we gave her and the 1st 2 days she did well on the samples we gave her xyzal,and rhinocort aq, but now the runny nose and eyes have returned along with a bad headache. Pt. Recently had eye surgery in August by Dr.McCuen. Pt. Has one more day on the prednisolone eye drops ophthamic susp. 1%. Please advise

## 2016-05-05 NOTE — Telephone Encounter (Signed)
Pt would like a return call from nurse to discuss her symptoms

## 2016-05-06 MED ORDER — LEVOCETIRIZINE DIHYDROCHLORIDE 5 MG PO TABS: 5.0000 mg | ORAL_TABLET | Freq: Every evening | ORAL | 5 refills | Status: DC

## 2016-05-06 MED ORDER — BUDESONIDE 32 MCG/ACT NA SUSP: NASAL | 5 refills | Status: DC

## 2016-05-06 NOTE — Telephone Encounter (Signed)
Spoke with patient daughter.  She informed us we have to call a nurse on staff at [facility] 1st floor Assisted Living.  Per Malachy Mood, patients nurse, patient is no longer self medicating.  Nurses must be in charge of giving patient all her medication.  It is ok to speak with patient regarding any problems if she calls,  but any orders and medication must be given to her assigned nurse at [facility].  WE MUST GO THROUGH THE NURSING STAFF AT [facility] BEFORE ANY MEDICATIONS ARE CALLED IN.     Call [phone] Please ask for nurse desk for assisted living first floor. Fax prescriptions to [phone].  Faxed Rx for Rhinocort and levocetirizine to nurse at [facility].

## 2016-05-06 NOTE — Telephone Encounter (Signed)
Left patient a message that Dr. Shaune Leeks said continue xyzal and cetirizine. Pt. Lives in assistant living at [facility] and a nurse will be administering pt.'s medications.

## 2016-05-06 NOTE — Telephone Encounter (Signed)
Patient should go back on Xyzal   and Rhinocort AQ

## 2016-05-07 ENCOUNTER — Telehealth: Payer: Self-pay | Admitting: Allergy

## 2016-05-07 ENCOUNTER — Other Ambulatory Visit: Payer: Self-pay | Admitting: Allergy

## 2016-05-07 NOTE — Telephone Encounter (Signed)
Talked with daughter. She said that [facility] did fill her mothers meds and they did deliver them to [facility] to the nurses.

## 2016-05-12 DIAGNOSIS — Z961 Presence of intraocular lens: Secondary | ICD-10-CM | POA: Diagnosis not present

## 2016-05-12 DIAGNOSIS — H5010 Unspecified exotropia: Secondary | ICD-10-CM | POA: Diagnosis not present

## 2016-05-12 DIAGNOSIS — H52203 Unspecified astigmatism, bilateral: Secondary | ICD-10-CM | POA: Diagnosis not present

## 2016-05-13 ENCOUNTER — Telehealth: Payer: Self-pay | Admitting: Allergy

## 2016-05-13 NOTE — Telephone Encounter (Signed)
BUDESONIDE NASAL SPRAY HAS BEEN APPROVED.

## 2016-05-15 DIAGNOSIS — M109 Gout, unspecified: Secondary | ICD-10-CM | POA: Diagnosis not present

## 2016-05-15 DIAGNOSIS — M79672 Pain in left foot: Secondary | ICD-10-CM | POA: Diagnosis not present

## 2016-06-03 DIAGNOSIS — B353 Tinea pedis: Secondary | ICD-10-CM | POA: Diagnosis not present

## 2016-06-05 ENCOUNTER — Telehealth: Payer: Self-pay | Admitting: Pediatrics

## 2016-06-06 DIAGNOSIS — M19072 Primary osteoarthritis, left ankle and foot: Secondary | ICD-10-CM | POA: Diagnosis not present

## 2016-06-06 DIAGNOSIS — M19071 Primary osteoarthritis, right ankle and foot: Secondary | ICD-10-CM | POA: Diagnosis not present

## 2016-06-06 DIAGNOSIS — B351 Tinea unguium: Secondary | ICD-10-CM | POA: Diagnosis not present

## 2016-06-10 ENCOUNTER — Other Ambulatory Visit: Payer: Self-pay

## 2016-06-10 MED ORDER — ROSUVASTATIN CALCIUM 5 MG PO TABS: 5.0000 mg | ORAL_TABLET | Freq: Every day | ORAL | 3 refills | Status: DC

## 2016-07-02 ENCOUNTER — Telehealth: Payer: Self-pay | Admitting: Cardiology

## 2016-07-08 ENCOUNTER — Ambulatory Visit (HOSPITAL_BASED_OUTPATIENT_CLINIC_OR_DEPARTMENT_OTHER): Payer: PPO | Admitting: Oncology

## 2016-07-08 ENCOUNTER — Ambulatory Visit (HOSPITAL_COMMUNITY)
Admission: RE | Admit: 2016-07-08 | Discharge: 2016-07-08 | Disposition: A | Discharge status: Home / Self Care | Payer: PPO | Source: Ambulatory Visit | Attending: Oncology | Admitting: Oncology

## 2016-07-08 ENCOUNTER — Telehealth: Payer: Self-pay | Admitting: Oncology

## 2016-07-08 VITALS — BP 129/49 | HR 53 | Temp 97.8°F | Resp 18 | Ht 63.0 in | Wt 99.5 lb

## 2016-07-08 DIAGNOSIS — D630 Anemia in neoplastic disease: Secondary | ICD-10-CM | POA: Diagnosis not present

## 2016-07-08 DIAGNOSIS — C9000 Multiple myeloma not having achieved remission: Secondary | ICD-10-CM | POA: Insufficient documentation

## 2016-07-08 DIAGNOSIS — M8580 Other specified disorders of bone density and structure, unspecified site: Secondary | ICD-10-CM | POA: Diagnosis not present

## 2016-07-08 DIAGNOSIS — R32 Unspecified urinary incontinence: Secondary | ICD-10-CM | POA: Diagnosis not present

## 2016-07-08 DIAGNOSIS — Z8579 Personal history of other malignant neoplasms of lymphoid, hematopoietic and related tissues: Secondary | ICD-10-CM | POA: Diagnosis not present

## 2016-07-08 NOTE — Telephone Encounter (Signed)
Appointments scheduled per 1/30 LOS. Patient given AVS report and calendars with future scheduled appointments. °

## 2016-07-08 NOTE — Progress Notes (Signed)
  [place] OFFICE PROGRESS NOTE   Diagnosis: Indolent multiple myeloma  INTERVAL HISTORY:   [name] returns as scheduled. She reports losing 15 pounds last summer secondary to "irritable bowel syndrome "and reflux symptoms. She has regained some of the weight, but she continues to have symptoms related to irritable bowel. No recent infection. She continues to have urinary incontinence. No significant pain. She is now living in assisted living at [facility].  Objective:  Vital signs in last 24 hours:  Blood pressure (!) 129/49, pulse (!) 53, temperature 97.8 F (36.6 C), temperature source Oral, resp. rate 18, height '5\' 3"'$  (1.6 m), weight 99 lb 8 oz (45.1 kg), SpO2 100 %.    HEENT: Neck without mass Lymphatics: No cervical, supraclavicular, or inguinal nodes, "shotty "bilateral axillary nodes Resp: Lungs clear bilaterally Cardio: Regular rate and rhythm GI: No hepatosplenomegaly Vascular: No leg edema   Lab Results:  Lab Results  Component Value Date   WBC 3.1 (L) 11/25/2015   HGB 10.9 (L) 11/25/2015   HCT 33.1 (L) 11/25/2015   MCV 97.4 11/25/2015   PLT 213 11/25/2015   NEUTROABS 3.6 10/28/2015  06/27/2016 at [facility] laboratory: Serum M spike 2.2, lambda light chains 340 MG/L, hemolytic 10.3, platelets 266,000, white count 3.3, ANC 1.28 creatinine 0.91   Medications: I have reviewed the patient's current medications.  Assessment/Plan: 1. Indolent multiple myeloma, asymptomatic. No clinical evidence of disease progression. A metastatic bone survey in February 2016 was negative for lytic lesions. The serum M spike and serum free lambda light chains are stable 2. Mild anemia secondary to multiple myeloma-stable 3. History of mild neutropenia, likely related to multiple myeloma.  4. History of coronary artery disease. 5. Osteopenia. 6. History of multiple urinary tract infections, followed by Dr. Diona Fanti. 7. "Raynaud" syndrome. 8. Chronic back  pain. 9. History of hyponatremia. 10. Right foot surgery February 2014 11. Bilateral ovarian cysts-followed by GYN oncology    Disposition:  [name] is stable from a hematologic standpoint. The serum M spike and free lambda light chains are unchanged. She has indolent multiple myeloma. No indication for treatment at present. We will check a metastatic bone survey today. [name] will return for an office and lab visit in 8 months.  15 minutes were spent with the patient today. The majority of the time was used for counseling and coordination of care.  Betsy Coder, MD  07/08/2016  11:58 AM

## 2016-07-09 NOTE — Telephone Encounter (Signed)
Close encounter 

## 2016-07-10 ENCOUNTER — Telehealth: Payer: Self-pay

## 2016-07-10 DIAGNOSIS — N3944 Nocturnal enuresis: Secondary | ICD-10-CM | POA: Diagnosis not present

## 2016-07-10 DIAGNOSIS — N3941 Urge incontinence: Secondary | ICD-10-CM | POA: Diagnosis not present

## 2016-07-10 NOTE — Telephone Encounter (Signed)
-----   Message from Tammy Pier, MD sent at 07/09/2016  8:56 PM EST ----- Please call patient, bone survey is stable, no evidence of myeloma bone lesions

## 2016-07-10 NOTE — Telephone Encounter (Signed)
Called and informed patients daughter of survey results. She will inform pt of these results and denies any questions or concerns at this time.

## 2016-07-14 DIAGNOSIS — Z803 Family history of malignant neoplasm of breast: Secondary | ICD-10-CM | POA: Diagnosis not present

## 2016-07-14 DIAGNOSIS — Z1231 Encounter for screening mammogram for malignant neoplasm of breast: Secondary | ICD-10-CM | POA: Diagnosis not present

## 2016-07-18 ENCOUNTER — Emergency Department (HOSPITAL_BASED_OUTPATIENT_CLINIC_OR_DEPARTMENT_OTHER)
Admission: EM | Admit: 2016-07-18 | Discharge: 2016-07-18 | Disposition: A | Discharge status: Home / Self Care | Payer: PPO | Attending: Emergency Medicine | Admitting: Emergency Medicine

## 2016-07-18 ENCOUNTER — Telehealth: Payer: Self-pay | Admitting: *Deleted

## 2016-07-18 ENCOUNTER — Emergency Department (HOSPITAL_BASED_OUTPATIENT_CLINIC_OR_DEPARTMENT_OTHER): Payer: PPO

## 2016-07-18 ENCOUNTER — Encounter (HOSPITAL_BASED_OUTPATIENT_CLINIC_OR_DEPARTMENT_OTHER): Payer: Self-pay | Admitting: *Deleted

## 2016-07-18 DIAGNOSIS — J4530 Mild persistent asthma, uncomplicated: Secondary | ICD-10-CM | POA: Insufficient documentation

## 2016-07-18 DIAGNOSIS — I25719 Atherosclerosis of autologous vein coronary artery bypass graft(s) with unspecified angina pectoris: Secondary | ICD-10-CM | POA: Diagnosis not present

## 2016-07-18 DIAGNOSIS — R32 Unspecified urinary incontinence: Secondary | ICD-10-CM | POA: Diagnosis not present

## 2016-07-18 DIAGNOSIS — Z951 Presence of aortocoronary bypass graft: Secondary | ICD-10-CM | POA: Insufficient documentation

## 2016-07-18 DIAGNOSIS — Z7982 Long term (current) use of aspirin: Secondary | ICD-10-CM | POA: Diagnosis not present

## 2016-07-18 DIAGNOSIS — R079 Chest pain, unspecified: Secondary | ICD-10-CM | POA: Diagnosis not present

## 2016-07-18 DIAGNOSIS — Z79899 Other long term (current) drug therapy: Secondary | ICD-10-CM | POA: Diagnosis not present

## 2016-07-18 DIAGNOSIS — R0789 Other chest pain: Secondary | ICD-10-CM | POA: Insufficient documentation

## 2016-07-18 DIAGNOSIS — I10 Essential (primary) hypertension: Secondary | ICD-10-CM | POA: Diagnosis not present

## 2016-07-18 LAB — CBC WITH DIFFERENTIAL/PLATELET
BASOS ABS: 0 10*3/uL (ref 0.0–0.1)
Basophils Relative: 1 %
EOS ABS: 0 10*3/uL (ref 0.0–0.7)
EOS PCT: 1 %
HCT: 30.8 % — ABNORMAL LOW (ref 36.0–46.0)
Hemoglobin: 10.5 g/dL — ABNORMAL LOW (ref 12.0–15.0)
LYMPHS ABS: 1 10*3/uL (ref 0.7–4.0)
Lymphocytes Relative: 31 %
MCH: 32.9 pg (ref 26.0–34.0)
MCHC: 34.1 g/dL (ref 30.0–36.0)
MCV: 96.6 fL (ref 78.0–100.0)
Monocytes Absolute: 0.5 10*3/uL (ref 0.1–1.0)
Monocytes Relative: 15 %
Neutro Abs: 1.7 10*3/uL (ref 1.7–7.7)
Neutrophils Relative %: 52 %
PLATELETS: 230 10*3/uL (ref 150–400)
RBC: 3.19 MIL/uL — AB (ref 3.87–5.11)
RDW: 13.4 % (ref 11.5–15.5)
WBC: 3.2 10*3/uL — AB (ref 4.0–10.5)

## 2016-07-18 LAB — COMPREHENSIVE METABOLIC PANEL
ALT: 23 U/L (ref 14–54)
AST: 39 U/L (ref 15–41)
Albumin: 3.9 g/dL (ref 3.5–5.0)
Alkaline Phosphatase: 37 U/L — ABNORMAL LOW (ref 38–126)
Anion gap: 7 (ref 5–15)
BUN: 18 mg/dL (ref 6–20)
CHLORIDE: 93 mmol/L — AB (ref 101–111)
CO2: 28 mmol/L (ref 22–32)
CREATININE: 0.73 mg/dL (ref 0.44–1.00)
Calcium: 8.9 mg/dL (ref 8.9–10.3)
Glucose, Bld: 89 mg/dL (ref 65–99)
POTASSIUM: 3.5 mmol/L (ref 3.5–5.1)
SODIUM: 128 mmol/L — AB (ref 135–145)
Total Bilirubin: 0.7 mg/dL (ref 0.3–1.2)
Total Protein: 8.1 g/dL (ref 6.5–8.1)

## 2016-07-18 LAB — TROPONIN I: Troponin I: <0.03 ng/mL (ref <0.03)

## 2016-07-18 LAB — BRAIN NATRIURETIC PEPTIDE: B Natriuretic Peptide: 393 pg/mL — ABNORMAL HIGH (ref 0.0–100.0)

## 2016-07-18 LAB — URINALYSIS, ROUTINE W REFLEX MICROSCOPIC
Bilirubin Urine: NEGATIVE
Glucose, UA: NEGATIVE mg/dL
HGB URINE DIPSTICK: NEGATIVE
KETONES UR: NEGATIVE mg/dL
Leukocytes, UA: NEGATIVE
Nitrite: NEGATIVE
PH: 8 (ref 5.0–8.0)
PROTEIN: 30 mg/dL — AB
Specific Gravity, Urine: 1.017 (ref 1.005–1.030)

## 2016-07-18 LAB — MAGNESIUM: MAGNESIUM: 1.9 mg/dL (ref 1.7–2.4)

## 2016-07-18 LAB — URINALYSIS, MICROSCOPIC (REFLEX): WBC UA: NONE SEEN WBC/hpf (ref 0–5)

## 2016-07-18 MED ORDER — AMLODIPINE BESYLATE 5 MG PO TABS
2.5000 mg | ORAL_TABLET | Freq: Once | ORAL | Status: AC
  Administered 2016-07-18: 2.5 mg via ORAL
  Filled 2016-07-18: qty 1

## 2016-07-18 MED ORDER — AMLODIPINE BESYLATE 5 MG PO TABS: 2.5000 mg | ORAL_TABLET | Freq: Every day | ORAL | 0 refills | Status: DC

## 2016-07-18 MED ORDER — ONDANSETRON HCL 4 MG/2ML IJ SOLN: 4.0000 mg | Freq: Once | INTRAMUSCULAR | Status: DC

## 2016-07-18 MED ORDER — GI COCKTAIL ~~LOC~~
30.0000 mL | Freq: Once | ORAL | Status: AC
  Administered 2016-07-18: 30 mL via ORAL
  Filled 2016-07-18: qty 30

## 2016-07-18 NOTE — ED Notes (Signed)
Patient denies nausea at present.  Zofran held.

## 2016-07-18 NOTE — Telephone Encounter (Signed)
Spoke with Malachy Mood at [place] at [facility] letting her know Dr Percival Spanish reviewed the blood pressure reading that was faxed to him and he want Mrs [name] to start Norvasc 2.5 mg daily, I asked if I need to send to pt pharmacy and she stated no they have a pharmacy [facility] and they will fill it.

## 2016-07-18 NOTE — ED Notes (Signed)
Patient complains of chest pain that began 1 week ago.  States this has been intermittent but the pain has worsened today.  States she thought this was reflux but has not gone away.  States that she had some nausea but this has subsided after eating soup.

## 2016-07-18 NOTE — ED Provider Notes (Signed)
[place] DEPT [facility] Provider Note   CSN: [id number] Arrival date & time: 07/18/16  1348     History   Chief Complaint Chief Complaint  Patient presents with  . Chest Pain    HPI [name] is a 83 y.o. female presents to the emergency department endorsing steady, central chest discomfort resembling indigestion associated with slight nausea onset at 0730 today.  Symptoms were not exertional.  Pt noticed symptoms soon after she woke up this morning. Pt states she has a history of GERD often but states this chest discomfort does not feel like her usual indigestion/reflux.  Pt also states this chest discomfort doesn't feel like heart pain she has had in the past.  She clarifies its not "chest pain" but chest discomfort.  Pt currently denies chest pain, shortness of breath, nausea, abdominal pain, reflux symptoms or indigestion. Patient has an extensive cardiac history including hypertension, hyperlipidemia, known CAD with CABG in 2007, anemia, GERD, multiple myeloma, asthma and myocardial infarction. Patient states she does not know when her myocardial infarction was, she is aware that it's in her past medical history however she cannot tell me the date or workup done for this. She is followed by Dr. Percival Spanish from [facility].  Last seen by Dr. Percival Spanish in August 2017.  Has upcoming appointment with Dr. Percival Spanish next Thursday.  Of note pt has been prescribed DDAVP for bladder incontinence started 07/11/15 by urologist (Dr.[name]).  I thoroughly reviewed patient's chart.  There is a left heart cath January 2014 showing EF 63%.  Per Dr. Rosezella Florida last note he noted a stress perfusion study from [facility] in 2016 which showed no ischemia and EF of 75%. Last admitted to hospital in June 2017 for chest pain but this was not felt to be anginal.  Please see discharge note on 11/26/15 for further MDM of discharge at that time.  HPI  Past Medical History:  Diagnosis Date  . Allergic rhinitis     . Anxiety   . Arthritis   . Asthma   . Coronary artery disease   . Coronary atherosclerosis of autologous vein bypass graft   . Coronary atherosclerosis of unspecified type of vessel, native or graft   . GERD (gastroesophageal reflux disease)   . Hx of CABG   . Hypercholesteremia   . Hyperlipidemia    GOAL LDL <70  . Hypertension   . IBS (irritable bowel syndrome)   . Incontinent of urine   . Intermediate coronary syndrome ([place])   . Mild chronic anemia   . Multiple myeloma (HCC)    Dr. Benay Spice  . Osteopenia   . Ovarian cyst, bilateral    Rt complex  . Overactive bladder   . Panic disorder   . Raynauds syndrome   . Spinal stenosis of lumbar region   . Urine test positive for microalbuminuria 12/2011    Patient Active Problem List   Diagnosis Date Noted  . Chest pain 11/26/2015  . Polymyalgia rheumatica ([place]) 11/26/2015  . Hypokalemia 11/26/2015  . Mild persistent asthma 11/12/2015  . Coronary artery disease involving autologous artery coronary bypass graft with angina pectoris with documented spasm ([place]) 11/12/2015  . Multiple myeloma in remission ([place]) 11/12/2015  . Current use of beta blocker 11/12/2015  . Gastroesophageal reflux disease without esophagitis 11/12/2015  . Other allergic rhinitis 11/12/2015  . Weight loss, non-intentional 10/03/2015  . Asthma 09/24/2015  . Asthma in adult without complication 70/78/6754  . Seasonal allergic rhinitis due to pollen 09/24/2015  .  Cervical radiculopathy 07/25/2015  . Chronic coronary artery disease 07/25/2015  . Coronary artery disease 07/25/2015  . Disturbance in sleep behavior 07/25/2015  . Elevated CK 07/25/2015  . Elevated creatine kinase level 07/25/2015  . Generalized osteoarthritis of hand 07/25/2015  . Other long term (current) drug therapy 07/25/2015  . Overactive bladder 07/25/2015  . Restless leg 07/25/2015  . Restless leg syndrome 07/25/2015  . Sleep disturbances 07/25/2015  . Idiopathic peripheral  neuropathy 07/04/2015  . Lumbar radiculopathy, chronic 07/04/2015  . 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 Risk for falls 07/04/2015  . Sleep disturbance 07/04/2015  . Chest pain with moderate risk for cardiac etiology 06/13/2015  . Right sciatic nerve pain 10/19/2014  . Abnormal thyroid blood test 08/25/2014  . Chronic fatigue 08/09/2014  . Multiple myeloma ([place]) 07/21/2013  . Depression 01/14/2013  . Generalized anxiety disorder 01/14/2013  . Gastroesophageal reflux disease 01/13/2013  . Cyst of ovary 12/17/2010  . KNEE PAIN, BILATERAL 08/15/2010  . Abnormal gait 08/15/2010  . Atherosclerosis of coronary artery bypass graft 10/11/2009  . CHEST PAIN, NON-CARDIAC 10/11/2009  . HIP PAIN, RIGHT 08/13/2009  . GREATER TROCHANTERIC BURSITIS 08/13/2009  . LUMBOSACRAL SPONDYLOSIS WITHOUT MYELOPATHY 11/10/2008  . Osteoarthritis of lumbosacral spine without myelopathy 11/10/2008  . FOOT PAIN, BILATERAL 06/30/2008  . Hyperlipidemia 06/22/2008  . Hypertension 06/22/2008  . Raynaud's disease 06/22/2008  . BURSITIS, LEFT SHOULDER 06/22/2008    Past Surgical History:  Procedure Laterality Date  . BUNIONECTOMY WITH HAMMERTOE RECONSTRUCTION Right 08/04/2012   Procedure:  HAMMERTOE RECONSTRUCTION;  Surgeon: Colin Rhein, MD;  Location: [place];  Service: Orthopedics;  Laterality: Right;  HAMMER TOE RECONSTRUCTION PROBABLE FLEXOR DIGITORUM LONGUS TO PROXIMAL PHALANX TRANSFER LATERALIZED   . CAPSULOTOMY Right 08/04/2012   Procedure: CAPSULOTOMY;  Surgeon: Colin Rhein, MD;  Location: [place];  Service: Orthopedics;  Laterality: Right;  RIGHT 2ND TOE METATARSOPHALANGEAL JOINT DORSAL CAPSULOTOMY   . CATARACT EXTRACTION, BILATERAL    . COLONOSCOPY    . CORONARY ANGIOPLASTY WITH STENT PLACEMENT  2014   2 stents  . CORONARY ARTERY BYPASS GRAFT  2007   LIMA to LAD, SVG to RCA  non-ST segment MI 08/2009 (Dr. Tamala Julian)  . DILATION AND CURETTAGE OF UTERUS    . EYE SURGERY      both cataracts  . HAMMER TOE SURGERY Right 08/2012  . LEFT HEART CATHETERIZATION WITH CORONARY/GRAFT ANGIOGRAM N/A 07/09/2012   Procedure: LEFT HEART CATHETERIZATION WITH Beatrix Fetters;  Surgeon: Sinclair Grooms, MD;  Location: [facility] CATH LAB;  Service: Cardiovascular;  Laterality: N/A;  . TONSILLECTOMY AND ADENOIDECTOMY    . UPPER GASTROINTESTINAL ENDOSCOPY      OB History    No data available       Home Medications    Prior to Admission medications   Medication Sig Start Date End Date Taking? Authorizing Provider  acetaminophen (TYLENOL) 650 MG CR tablet Take 1,300 mg by mouth 2 (two) times daily.   Yes Historical Provider, MD  Albuterol Sulfate (PROAIR RESPICLICK IN) Inhale 2 puffs into the lungs every 6 (six) hours as needed (for shortness of breath).    Yes Historical Provider, MD  aspirin EC 81 MG tablet Take 1 tablet (81 mg total) by mouth daily. 01/21/16  Yes Minus Breeding, MD  Biotin 2.5 MG CAPS Take 2.5 mg by mouth daily.   Yes Historical Provider, MD  budesonide (RHINOCORT AQUA) 32 MCG/ACT nasal spray USE ONE-TWO SPRAYS EACH NOSTRILS TWICE A DAY AS NEEDED.  05/06/16  Yes Charlies Silvers, MD  calcium-vitamin D (OSCAL WITH D) 500-200 MG-UNIT tablet Take 1 tablet by mouth.   Yes Historical Provider, MD  desmopressin (DDAVP) 0.1 MG tablet Take 0.1 mg by mouth at bedtime.   Yes Historical Provider, MD  donepezil (ARICEPT) 5 MG tablet Take 5 mg by mouth at bedtime.   Yes Historical Provider, MD  famotidine (PEPCID) 40 MG tablet Take 20-40 mg by mouth 2 (two) times daily. 20 mg every morning and 40 mg every evening   Yes Historical Provider, MD  isosorbide mononitrate (IMDUR) 60 MG 24 hr tablet Take 60 mg by mouth daily.   Yes Historical Provider, MD  levocetirizine (XYZAL) 5 MG tablet Take 5 mg by mouth every evening.   Yes Historical Provider, MD  LORazepam (ATIVAN) 1 MG tablet Take 0.5 mg by mouth at bedtime.  11/08/15  Yes Historical Provider, MD  memantine (NAMENDA) 10 MG  tablet Take 10 mg by mouth daily.   Yes Historical Provider, MD  metoprolol tartrate (LOPRESSOR) 25 MG tablet Take 25 mg by mouth 2 (two) times daily.   Yes Historical Provider, MD  mirabegron ER (MYRBETRIQ) 25 MG TB24 tablet Take 25 mg by mouth every morning.    Yes Historical Provider, MD  montelukast (SINGULAIR) 10 MG tablet Take 10 mg by mouth at bedtime.    Yes Historical Provider, MD  Multiple Vitamin (MULITIVITAMIN WITH MINERALS) TABS Take 1 tablet by mouth daily.   Yes Historical Provider, MD  nitroGLYCERIN (NITROSTAT) 0.4 MG SL tablet Place 0.4 mg under the tongue every 5 (five) minutes as needed for chest pain. Reported on 11/12/2015   Yes Historical Provider, MD  polyethylene glycol (MIRALAX / GLYCOLAX) packet Take 8.5 g by mouth at bedtime.    Yes Historical Provider, MD  prednisoLONE acetate (PRED FORTE) 1 % ophthalmic suspension Place 1 drop into both eyes 2 (two) times daily.  06/16/16  Yes Historical Provider, MD  rosuvastatin (CRESTOR) 5 MG tablet Take 1 tablet (5 mg total) by mouth daily at 6 PM. 06/10/16  Yes Minus Breeding, MD  sertraline (ZOLOFT) 25 MG tablet Take 25 mg by mouth daily.   Yes Historical Provider, MD  sertraline (ZOLOFT) 50 MG tablet Take 50 mg by mouth daily. 02/01/16  Yes Historical Provider, MD  acetaminophen (TYLENOL) 500 MG tablet Take 1,000 mg by mouth 2 (two) times daily.     Historical Provider, MD  amLODipine (NORVASC) 5 MG tablet Take 0.5 tablets (2.5 mg total) by mouth daily. 07/18/16   Kinnie Feil, PA-C  calcium carbonate (OS-CAL) 600 MG TABS tablet Take 600 mg by mouth daily with breakfast.    Historical Provider, MD  fluticasone (FLONASE) 50 MCG/ACT nasal spray 1-2 sprays in each nostril once daily for stuffy nose. 11/12/15   Charlies Silvers, MD  loratadine (CLARITIN) 10 MG tablet Take 10 mg by mouth daily.    Historical Provider, MD  Polyethyl Glycol-Propyl Glycol (SYSTANE OP) Apply 1 drop to eye daily as needed (for dry eyes).    Historical Provider, MD      Family History Family History  Problem Relation Age of Onset  . Heart attack Mother   . CAD Mother   . Obesity Sister   . Cancer Paternal Aunt     breast  . Asthma Neg Hx   . Eczema Neg Hx   . Urticaria Neg Hx   . Allergic rhinitis Neg Hx   . Angioedema Neg Hx   .  Atopy Neg Hx   . Immunodeficiency Neg Hx     Social History Social History  Substance Use Topics  . Smoking status: Never Smoker  . Smokeless tobacco: Never Used  . Alcohol use Yes     Comment: occasional     Allergies   Butazolidin [phenylbutazone]; Cephalosporins; Codeine; Levaquin [levofloxacin]; Macrodantin; Morphine and related; Trimethoprim; Cilostazol; Clindamycin; Dexilant [dexlansoprazole]; Erythromycin; Morphine; Nitrofurantoin; Simvastatin; Sulfa antibiotics; Vesicare [solifenacin]; Clindamycin/lincomycin; Food; Ibuprofen; Lincomycin hcl; Penicillins; Toviaz [fesoterodine fumarate er]; and Voltaren [diclofenac sodium]   Review of Systems Review of Systems  Constitutional: Negative for appetite change, chills and fever.  HENT: Negative for congestion and sore throat.   Eyes: Negative for photophobia and visual disturbance.  Respiratory: Negative for cough, choking, chest tightness and shortness of breath.   Cardiovascular: Positive for chest pain (indigestion-like chest discomfort). Negative for palpitations and leg swelling.  Gastrointestinal: Positive for nausea (resolved). Negative for abdominal pain, constipation, diarrhea and vomiting.  Endocrine: Negative for polydipsia.  Genitourinary: Positive for enuresis (chronic bladder incontinence). Negative for difficulty urinating, dysuria, flank pain, hematuria and urgency.  Musculoskeletal: Negative for arthralgias and myalgias.  Skin: Negative for rash and wound.  Neurological: Positive for light-headedness (resolved). Negative for dizziness, seizures, syncope, weakness, numbness and headaches.  Hematological: Does not bruise/bleed easily.   Psychiatric/Behavioral: Negative.      Physical Exam Updated Vital Signs BP 136/92   Pulse (!) 51   Temp 98 F (36.7 C)   Resp 16   Ht '5\' 3"'  (1.6 m)   Wt 44.9 kg   SpO2 96%   BMI 17.54 kg/m   Physical Exam  Constitutional: She is oriented to person, place, and time. She appears well-developed and well-nourished.  Non toxic appearing, good historian in NAD.  HENT:  Head: Normocephalic and atraumatic.  Nose: Nose normal.  Mouth/Throat: Oropharynx is clear and moist. No oropharyngeal exudate.  Moist mucous membranes.  No nasal mucosa edema. Sinuses non tender. Oropharynx and tonsils normal without edema, erythema, exudates or lesions.  Uvula midline. No trismus.   Eyes: Conjunctivae and EOM are normal. Pupils are equal, round, and reactive to light.  Neck: Normal range of motion. Neck supple. No JVD present.  Cardiovascular: Normal rate, regular rhythm, normal heart sounds and intact distal pulses.   No murmur heard. SBP 157. HR 50. No JVD with head of bed at 30 degrees. Carotid pulses 2+ bilaterally without bruits. Good S1 and S2. No extra sounds or murmurs. 2+ and symmetric radial, dorsalis pedis and posterior tibial pulses bilaterally.   Capillary refill brisk in upper extremities.  No varicosities seen. No lower extremity edema.  Patient denied shortness of breath, throat congestion or difficulty breathing with head of the bed flat. No orthopnea.  Good lung sounds without crackles.  Pulmonary/Chest: Effort normal and breath sounds normal. No respiratory distress. She has no wheezes. She has no rales. She exhibits no tenderness.  RR within normal limits. SpO2 within normal limits.  Normal breathing effort. Patient speaking in full sentences. No pursed lip breathing. No chest wall retractions. No cyanosis. Chest wall expansion symmetric.  No chest wall tenderness. Lungs CTAB anteriorly and posteriorly without wheezing, rhonchi or crackles.  No egophony.  No JVD. No  orthopnea with head of bed flat.   Abdominal: Soft. Bowel sounds are normal. She exhibits no distension and no mass. There is no tenderness. There is no rebound and no guarding.  Musculoskeletal: Normal range of motion. She exhibits no deformity.  Lymphadenopathy:  She has no cervical adenopathy.  Neurological: She is alert and oriented to person, place, and time. No sensory deficit.  Skin: Skin is warm and dry. Capillary refill takes less than 2 seconds.  Psychiatric: She has a normal mood and affect. Her behavior is normal. Judgment and thought content normal.  Nursing note and vitals reviewed.    ED Treatments / Results  Labs (all labs ordered are listed, but only abnormal results are displayed) Labs Reviewed  CBC WITH DIFFERENTIAL/PLATELET - Abnormal; Notable for the following:       Result Value   WBC 3.2 (*)    RBC 3.19 (*)    Hemoglobin 10.5 (*)    HCT 30.8 (*)    All other components within normal limits  COMPREHENSIVE METABOLIC PANEL - Abnormal; Notable for the following:    Sodium 128 (*)    Chloride 93 (*)    Alkaline Phosphatase 37 (*)    All other components within normal limits  URINALYSIS, ROUTINE W REFLEX MICROSCOPIC - Abnormal; Notable for the following:    APPearance CLOUDY (*)    Protein, ur 30 (*)    All other components within normal limits  BRAIN NATRIURETIC PEPTIDE - Abnormal; Notable for the following:    B Natriuretic Peptide 393.0 (*)    All other components within normal limits  URINALYSIS, MICROSCOPIC (REFLEX) - Abnormal; Notable for the following:    Bacteria, UA RARE (*)    Squamous Epithelial / LPF 0-5 (*)    All other components within normal limits  MAGNESIUM  TROPONIN I  TROPONIN I    EKG  EKG Interpretation  Date/Time:  Friday July 18 2016 16:41:52 EST Ventricular Rate:  53 PR Interval:    QRS Duration: 91 QT Interval:  453 QTC Calculation: 426 R Axis:   18 Text Interpretation:  Sinus rhythm No significant change since  last tracing Confirmed by [facility] MD, ERIN ([id number]) on 07/18/2016 5:09:09 PM       Radiology Dg Chest 2 View  Result Date: 07/18/2016 CLINICAL DATA:  Chest pain EXAM: CHEST  2 VIEW COMPARISON:  07/08/2016 chest radiograph. FINDINGS: Stable configuration of median sternotomy wires. Stable cardiomediastinal silhouette with normal heart size and aortic atherosclerosis. No pneumothorax. No pleural effusion. Mildly hyperinflated lungs. No pulmonary edema. No acute consolidative airspace disease. IMPRESSION: 1. Mildly hyperinflated lungs, which may indicate an obstructive lung disease. 2. Otherwise no active disease in the chest . 3. Aortic atherosclerosis. Electronically Signed   By: Ilona Sorrel M.D.   On: 07/18/2016 14:43    Procedures Procedures (including critical care time)  Medications Ordered in ED Medications  ondansetron (ZOFRAN) injection 4 mg (not administered)  amLODipine (NORVASC) tablet 2.5 mg (not administered)  gi cocktail (Maalox,Lidocaine,Donnatal) (30 mLs Oral Given 07/18/16 1650)     Initial Impression / Assessment and Plan / ED Course  I have reviewed the triage vital signs and the nursing notes.  Pertinent labs & imaging results that were available during my care of the patient were reviewed by me and considered in my medical decision making (see chart for details).  Clinical Course as of Jul 18 1799  Fri Jul 18, 2016  1627 Non-ischemic EKG, unchanged from previous tracing EKG 12-Lead [CG]  1627 FINDINGS: Stable configuration of median sternotomy wires. Stable cardiomediastinal silhouette with normal heart size and aortic atherosclerosis. No pneumothorax. No pleural effusion. Mildly hyperinflated lungs. No pulmonary edema. No acute consolidative airspace disease.  DG Chest 2 View [CG]  1628 Normal troponin  Troponin I: <0.03 [CG]  1628 Mg normal Magnesium: 1.9 [CG]  1628 K normal Potassium: 3.5 [CG]  1628 Elevated SBP ~170s BP: 178/55 [CG]  1629 Brady Pulse Rate: (!)  52 [CG]  1629 Brady  Pulse Rate: (!) 50 [CG]  1629 Hyponatremia Sodium: (!) 128 [CG]  1630 Anemia, chronic, 10.9 seven months ago Hemoglobin: (!) 10.5 [CG]  1631 Elevated BNP although no signs of fluid overload on exam or CXR B Natriuretic Peptide: (!) 393.0 [CG]  1717 Held metoprolol due to low HR and improving SBP  [CG]  1742 Talked with patient's daughter [name]) who is POA, discussed ED work up and plan to discharge. POA agreeable to discharge plan. POA aware of new medication changes at discharge.  [CG]  1800 Normal repeat troponin, patient will be discharged Troponin I: <0.03 [CG]    Clinical Course User Index [CG] Kinnie Feil, PA-C   83 yo female with extensive cardiac medical history presents with steady, central chest discomfort resembling indigestion associated with slight nausea onset at 0730 today. Chest discomfort is not exertional and not associated with shortness of breath. On initial exam SBP is elevated around 150-170s with HR borderline bradycardic in low 50s, RRR, pulses are symmetric in upper and lower extremities bilaterally, no signs of fluid overload. Lungs are clear to auscultation bilaterally without crackles. No lower extremity edema. No orthopnea.  Initial differential diagnosis includes ACS, GERD, pneumonia.  EKG is nonischemic and unchanged from previous study, chest x-ray normal without pneumothorax, effusion, consolidation or pulmonary edema. Initial troponin normal. Magnesium and potassium normal. Hyponatremia with sodium at 128, patient tends to be hyponatremic at baseline. BNP is elevated at 393 however patient does not look fluid overloaded on exam or on chest x-ray, also pt on DDAVP, doubt new onset CHF. Patient has been borderline bradycardic in the ED with HR hovering over her low 50s however pt is on metoprolol and imdur for htn. Initial elevated SBP has been fluctuating on cardiac monitor from 140-170.  I personally checked SBP which was 185.  Patient has  been ambulatory in ED without chest pain, sob, n/v.  Patient tolerating PO. Although patient has high HEART score due to past medical history, chest discomfort does not sound cardiac in nature to me and ED work up has been reassuring so far.  Dr. Billy Fischer had discussion with patient and patient's husband regarding admission vs discharge with close cardiology follow up.  Will get delta troponin.  Suspect repeat troponin will be normal, if so pt will be discharged home with close f/u with cardiology and urology (has upcoming appointment scheduled with Dr. Percival Spanish 07/24/16 and Dr. Amalia Hailey 07/23/16).  Will d/c DDAVP and add amlodipine 2.5 daily for better BP control. Strict Ed return precautions. Pt was discussed with Dr. Billy Fischer who assisted with MDM and agrees with discharge plan.  I spoke to pt's daughter and POA (susan) prior to patient discharge, POA agreeable to discharge plan and is aware of new medication changes and need to f/u with cardiology and urology next week as scheduled.   1800: repeat troponin normal, patient ready for discharge  Final Clinical Impressions(s) / ED Diagnoses   Final diagnoses:  Nonspecific chest pain  Elevated systolic blood pressure reading with diagnosis of hypertension    New Prescriptions New Prescriptions   AMLODIPINE (NORVASC) 5 MG TABLET    Take 0.5 tablets (2.5 mg total) by mouth daily.     Kinnie Feil, PA-C 07/18/16 [place]  Donney Rankins, PA-C 07/18/16 [place], MD 07/21/16 1416

## 2016-07-18 NOTE — Discharge Instructions (Signed)
NEW MEDICATION CHANGES: 1. Continue taking metoprolol tartrate as prescribed 2. Continue taking  isosorbide mononitrate as prescribed 3. START taking amlodipine 2.5 mg (half a tab) daily 4. STOP taking DDAVP (desmopressin) until you see urologist next week  Please follow up with Dr. Percival Spanish and Dr. Amalia Hailey at your upcoming appointments.  Discuss your new medication changes with your cardiologist, urologist and primary care provider. Your cardiologist, urologist and primary care provider should be able to access the work up and orders we did today in the emergency department.   Return to the emergency department if you have chest pain associated with shortness of breath, nausea, vomiting, sweating, weakness.

## 2016-07-18 NOTE — ED Triage Notes (Signed)
Pt c/o central chest pain/ nausea x 6 hrs denies SOB

## 2016-07-18 NOTE — ED Notes (Signed)
Pt appears to be resting comfortably, denies any chest pain/ pressure, SOB, abdominal discomfort.

## 2016-07-19 DIAGNOSIS — N3281 Overactive bladder: Secondary | ICD-10-CM | POA: Diagnosis not present

## 2016-07-19 DIAGNOSIS — I10 Essential (primary) hypertension: Secondary | ICD-10-CM | POA: Diagnosis not present

## 2016-07-19 DIAGNOSIS — R634 Abnormal weight loss: Secondary | ICD-10-CM | POA: Diagnosis not present

## 2016-07-21 DIAGNOSIS — N3946 Mixed incontinence: Secondary | ICD-10-CM | POA: Diagnosis not present

## 2016-07-23 DIAGNOSIS — R32 Unspecified urinary incontinence: Secondary | ICD-10-CM | POA: Diagnosis not present

## 2016-07-23 DIAGNOSIS — N3944 Nocturnal enuresis: Secondary | ICD-10-CM | POA: Diagnosis not present

## 2016-07-23 NOTE — Progress Notes (Signed)
Cardiology Office Note   Date:  07/25/2016   ID:  [name], DOB [birth year], MRN [id number]  PCP:  Lucille Passy, MD  Cardiologist:   Minus Breeding, MD   Chief Complaint  Patient presents with  . Hypertension      History of Present Illness: [name] is a 83 y.o. female who presents of CAD.  She has been seen by Dr. Tamala Julian in our practice and Dr. Elonda Husky in [facility].  Dr. Elonda Husky is no longer working in the area and Dr. Tamala Julian discharged the patient from his practice.   She was most recently admitted to the hospital in June of last year with chest pain but this was not felt to be anginal.   A stress test in [facility] two years ago had no ischemia and an EF of 75%.     She's had some elevated blood pressures recently. She was in the emergency room on 29 and I reviewed these records. She had some GI but also had had a systolic blood pressure apparently 200 at [facility].  We talked to her and through the emergency room she did have Norvasc 2.5 mg added to her meds. She is mostly bothered by urinary incontinence and is getting this evaluated by another urologist. She says when she has her blood pressure being elevated it's just routinely being checked and she doesn't notice it. She doesn't feel bad with it. She is limited by things such as polymyalgia rheumatica. However she was participating in yoga and tai chi. She was not having any chest pressure, neck or arm discomfort. She wasn't noticing any palpitations, presyncope or syncope. She has no new shortness of breath.   Past Medical History:  Diagnosis Date  . Allergic rhinitis   . Anxiety   . Arthritis   . Asthma   . Coronary artery disease   . Coronary atherosclerosis of autologous vein bypass graft   . Coronary atherosclerosis of unspecified type of vessel, native or graft   . GERD (gastroesophageal reflux disease)   . Hx of CABG   . Hypercholesteremia   . Hyperlipidemia    GOAL LDL <70  . Hypertension     . IBS (irritable bowel syndrome)   . Incontinent of urine   . Intermediate coronary syndrome ([place])   . Mild chronic anemia   . Multiple myeloma (H[facility])    Dr. Benay Spice  . Osteopenia   . Ovarian cyst, bilateral    Rt complex  . Overactive bladder   . Panic disorder   . Raynauds syndrome   . Spinal stenosis of lumbar region   . Urine test positive for microalbuminuria 12/2011    Past Surgical History:  Procedure Laterality Date  . BUNIONECTOMY WITH HAMMERTOE RECONSTRUCTION Right 08/04/2012   Procedure:  HAMMERTOE RECONSTRUCTION;  Surgeon: Colin Rhein, MD;  Location: [place];  Service: Orthopedics;  Laterality: Right;  HAMMER TOE RECONSTRUCTION PROBABLE FLEXOR DIGITORUM LONGUS TO PROXIMAL PHALANX TRANSFER LATERALIZED   . CAPSULOTOMY Right 08/04/2012   Procedure: CAPSULOTOMY;  Surgeon: Colin Rhein, MD;  Location: [place];  Service: Orthopedics;  Laterality: Right;  RIGHT 2ND TOE METATARSOPHALANGEAL JOINT DORSAL CAPSULOTOMY   . CATARACT EXTRACTION, BILATERAL    . COLONOSCOPY    . CORONARY ANGIOPLASTY WITH STENT PLACEMENT  2014   2 stents  . CORONARY ARTERY BYPASS GRAFT  2007   LIMA to LAD, SVG to RCA  non-ST segment MI 08/2009 (Dr.  Smith)  . DILATION AND CURETTAGE OF UTERUS    . EYE SURGERY     both cataracts  . HAMMER TOE SURGERY Right 08/2012  . LEFT HEART CATHETERIZATION WITH CORONARY/GRAFT ANGIOGRAM N/A 07/09/2012   Procedure: LEFT HEART CATHETERIZATION WITH Beatrix Fetters;  Surgeon: Sinclair Grooms, MD;  Location: [facility] CATH LAB;  Service: Cardiovascular;  Laterality: N/A;  . TONSILLECTOMY AND ADENOIDECTOMY    . UPPER GASTROINTESTINAL ENDOSCOPY       Current Outpatient Prescriptions  Medication Sig Dispense Refill  . acetaminophen (TYLENOL) 500 MG tablet Take 1,000 mg by mouth 2 (two) times daily.     . Albuterol Sulfate (PROAIR RESPICLICK IN) Inhale 2 puffs into the lungs every 6 (six) hours as needed (for shortness of breath).      Marland Kitchen amLODipine (NORVASC) 5 MG tablet Take 0.5 tablets (2.5 mg total) by mouth daily. 15 tablet 0  . aspirin EC 81 MG tablet Take 1 tablet (81 mg total) by mouth daily. 90 tablet 3  . Biotin 5000 MCG CAPS Take 5,000 mg by mouth daily.     . budesonide (RHINOCORT AQUA) 32 MCG/ACT nasal spray USE ONE-TWO SPRAYS EACH NOSTRILS TWICE A DAY AS NEEDED. 8.6 g 5  . calcium carbonate (OS-CAL) 600 MG TABS tablet Take 600 mg by mouth daily with breakfast.    . calcium-vitamin D (OSCAL WITH D) 500-200 MG-UNIT tablet Take 1 tablet by mouth.    . desmopressin (DDAVP) 0.1 MG tablet Take 0.1 mg by mouth at bedtime.    Marland Kitchen desmopressin (DDAVP) 0.1 MG tablet Take 0.1 mg by mouth as directed.    . donepezil (ARICEPT) 5 MG tablet Take 5 mg by mouth at bedtime.    . famotidine (PEPCID) 40 MG tablet Take 20-40 mg by mouth 2 (two) times daily. 20 mg every morning and 40 mg every evening    . isosorbide mononitrate (IMDUR) 60 MG 24 hr tablet Take 60 mg by mouth daily.    Marland Kitchen levocetirizine (XYZAL) 5 MG tablet Take 5 mg by mouth every evening.    Marland Kitchen LORazepam (ATIVAN) 1 MG tablet Take 0.5 mg by mouth at bedtime.     . memantine (NAMENDA) 10 MG tablet Take 10 mg by mouth daily.    . metoprolol tartrate (LOPRESSOR) 25 MG tablet Take 25 mg by mouth 2 (two) times daily.    . mirabegron ER (MYRBETRIQ) 25 MG TB24 tablet Take 25 mg by mouth every morning.     . montelukast (SINGULAIR) 10 MG tablet Take 10 mg by mouth at bedtime.     . Multiple Vitamin (MULITIVITAMIN WITH MINERALS) TABS Take 1 tablet by mouth daily.    . nitroGLYCERIN (NITROSTAT) 0.4 MG SL tablet Place 0.4 mg under the tongue every 5 (five) minutes as needed for chest pain. Reported on 11/12/2015    . Polyethyl Glycol-Propyl Glycol (SYSTANE OP) Apply 1 drop to eye daily as needed (for dry eyes).    . polyethylene glycol (MIRALAX / GLYCOLAX) packet Take 8.5 g by mouth at bedtime.     . prednisoLONE acetate (PRED FORTE) 1 % ophthalmic suspension Place 1 drop into both  eyes 2 (two) times daily.     . rosuvastatin (CRESTOR) 5 MG tablet Take 1 tablet (5 mg total) by mouth daily at 6 PM. 90 tablet 3  . sertraline (ZOLOFT) 25 MG tablet Take 25 mg by mouth daily.    . sertraline (ZOLOFT) 50 MG tablet Take 50 mg by mouth as directed.    Marland Kitchen  hydrALAZINE (APRESOLINE) 10 MG tablet Take 1 tablet (10 mg total) by mouth as needed. Take if systolic blood pressure greater than 185 30 tablet 6  . polyethylene glycol powder (GLYCOLAX/MIRALAX) powder Take 17 g by mouth as directed.     No current facility-administered medications for this visit.      ROS:  Please see the history of present illness.   Otherwise, review of systems are positive for Urinary incontinence, fatigue, irritable bowel, reflux, emotional stress..   All other systems are reviewed and negative.    PHYSICAL EXAM: VS:  BP (!) 149/60   Pulse (!) 55   Ht '5\' 3"'  (1.6 m)   Wt 99 lb (44.9 kg)   BMI 17.54 kg/m  , BMI Body mass index is 17.54 kg/m. GENERAL:  Well appearing but looks her stated age. NECK:  No jugular venous distention, waveform within normal limits, carotid upstroke brisk and symmetric, no bruits, no thyromegaly LUNGS:  Clear to auscultation bilaterally BACK:  No CVA tenderness CHEST:  Unremarkable HEART:  PMI not displaced or sustained,S1 and S2 within normal limits, no S3, no S4, no clicks, no rubs, no murmurs ABD:  Flat, positive bowel sounds normal in frequency in pitch, no bruits, no rebound, no guarding, no midline pulsatile mass, no hepatomegaly, no splenomegaly EXT:  2 plus pulses throughout, no edema, no cyanosis no clubbing   EKG:  EKG is not  ordered today.   Recent Labs: 07/18/2016: ALT 23; B Natriuretic Peptide 393.0; BUN 18; Creatinine, Ser 0.73; Hemoglobin 10.5; Magnesium 1.9; Platelets 230; Potassium 3.5; Sodium 128    Lipid Panel    Component Value Date/Time   CHOL 128 07/09/2012 0105   TRIG 22 07/09/2012 0105   HDL 73 07/09/2012 0105   CHOLHDL 1.8 07/09/2012 0105    VLDL 4 07/09/2012 0105   LDLCALC 51 07/09/2012 0105      Wt Readings from Last 3 Encounters:  07/24/16 99 lb (44.9 kg)  07/18/16 99 lb (44.9 kg)  07/08/16 99 lb 8 oz (45.1 kg)      Other studies Reviewed: Additional studies/ records that were reviewed today include:  ED records Review of the above records demonstrates:     ASSESSMENT AND PLAN:  Atherosclerosis of coronary artery bypass graft of native heart without angina pectoris She's had no new chest discomfort. No change in therapy is indicated.  Essential hypertension Her blood pressure has been spiking. Given written instructions to check the blood pressure twice a day for the next 2 weeks in December with results to me. She'll also get a prescription for hydralazine 10 mg when necessary systolic blood pressure greater than 185. She's not taking Norvasc 2.5 mg daily which should help with her readings.  Hyperlipidemia This is followed by Lucille Passy, MD.   I will defer to their management.    Multiple somatic complaints She is mostly bothered by urinary incontinence. She enjoys yoga and tai chi when she can get the classes.    Current medicines are reviewed at length with the patient today.  The patient does not have concerns regarding medicines.  The following changes have been made:  As above  Labs/ tests ordered today include: None No orders of the defined types were placed in this encounter.    Disposition:   FU with me in six weeks.   Signed, Minus Breeding, MD  07/25/2016 7:40 AM    [facility]

## 2016-07-24 ENCOUNTER — Ambulatory Visit (INDEPENDENT_AMBULATORY_CARE_PROVIDER_SITE_OTHER): Payer: PPO | Admitting: Cardiology

## 2016-07-24 VITALS — BP 149/60 | HR 55 | Ht 63.0 in | Wt 99.0 lb

## 2016-07-24 DIAGNOSIS — I2510 Atherosclerotic heart disease of native coronary artery without angina pectoris: Secondary | ICD-10-CM | POA: Diagnosis not present

## 2016-07-24 DIAGNOSIS — E782 Mixed hyperlipidemia: Secondary | ICD-10-CM | POA: Diagnosis not present

## 2016-07-24 DIAGNOSIS — I10 Essential (primary) hypertension: Secondary | ICD-10-CM

## 2016-07-24 MED ORDER — HYDRALAZINE HCL 10 MG PO TABS: 10.0000 mg | ORAL_TABLET | ORAL | 6 refills | Status: DC | PRN

## 2016-07-24 NOTE — Patient Instructions (Signed)
Medication Instructions:  START- Hydralazine 10 mg as needed take if systolic blood pressure greater that 185  Labwork: None Ordered  Testing/Procedures: None Ordered  Follow-Up: Your physician recommends that you schedule a follow-up appointment in: 6 Weeks   Any Other Special Instructions Will Be Listed Below (If Applicable).     If you need a refill on your cardiac medications before your next appointment, please call your pharmacy.

## 2016-07-25 ENCOUNTER — Encounter: Payer: Self-pay | Admitting: Cardiology

## 2016-08-01 ENCOUNTER — Telehealth: Payer: Self-pay | Admitting: Cardiology

## 2016-08-01 DIAGNOSIS — I160 Hypertensive urgency: Secondary | ICD-10-CM | POA: Diagnosis not present

## 2016-08-01 NOTE — Telephone Encounter (Signed)
Ella Jubilee, NP and communicated med change advice per Dr. Martinique. Advised that patient monitor BP and contact if BP remains elevated or if becomes hypotensive + symptomatic.   Med list updated.

## 2016-08-01 NOTE — Telephone Encounter (Signed)
New Message  [name] at [facility] requesting appt for today, requesting call back   Pt c/o BP issue: STAT if pt c/o blurred vision, one-sided weakness or slurred speech  1. What are your last 5 BP readings? 190/60  200/87  2. Are you having any other symptoms (ex. Dizziness, headache, blurred vision, passed out)? No  3. What is your BP issue? BP has been running really high for past few days

## 2016-08-01 NOTE — Telephone Encounter (Signed)
Spoke with DOD and he advised hydralazine 25mg  TID daily.

## 2016-08-01 NOTE — Telephone Encounter (Signed)
Spoke with NP @ [place] - BP has been elevated above 180s for 3 days Has taken PRN hydralazine 10mg  for 3 days Patient complains of shortness of breath, a little anxious Patient complains of left chest wall pain Patient's normal BP [id number] systolic   Dyann Ruddle, NP would like advice on what to do with patient/medication - should she come in for appointment or go to ED or have medications changed  Will speak with DOD Dr. Martinique and contact Dyann Ruddle, NP

## 2016-08-03 ENCOUNTER — Emergency Department (HOSPITAL_BASED_OUTPATIENT_CLINIC_OR_DEPARTMENT_OTHER): Payer: PPO

## 2016-08-03 ENCOUNTER — Encounter (HOSPITAL_BASED_OUTPATIENT_CLINIC_OR_DEPARTMENT_OTHER): Payer: Self-pay | Admitting: Emergency Medicine

## 2016-08-03 ENCOUNTER — Emergency Department (HOSPITAL_BASED_OUTPATIENT_CLINIC_OR_DEPARTMENT_OTHER)
Admission: EM | Admit: 2016-08-03 | Discharge: 2016-08-03 | Disposition: A | Discharge status: Home / Self Care | Payer: PPO | Attending: Emergency Medicine | Admitting: Emergency Medicine

## 2016-08-03 ENCOUNTER — Other Ambulatory Visit: Payer: Self-pay

## 2016-08-03 DIAGNOSIS — Z7982 Long term (current) use of aspirin: Secondary | ICD-10-CM | POA: Diagnosis not present

## 2016-08-03 DIAGNOSIS — R079 Chest pain, unspecified: Secondary | ICD-10-CM | POA: Diagnosis not present

## 2016-08-03 DIAGNOSIS — J45909 Unspecified asthma, uncomplicated: Secondary | ICD-10-CM | POA: Insufficient documentation

## 2016-08-03 DIAGNOSIS — I2510 Atherosclerotic heart disease of native coronary artery without angina pectoris: Secondary | ICD-10-CM | POA: Insufficient documentation

## 2016-08-03 DIAGNOSIS — R109 Unspecified abdominal pain: Secondary | ICD-10-CM | POA: Diagnosis not present

## 2016-08-03 DIAGNOSIS — Z79899 Other long term (current) drug therapy: Secondary | ICD-10-CM | POA: Insufficient documentation

## 2016-08-03 DIAGNOSIS — K862 Cyst of pancreas: Secondary | ICD-10-CM | POA: Diagnosis not present

## 2016-08-03 DIAGNOSIS — R0781 Pleurodynia: Secondary | ICD-10-CM | POA: Diagnosis not present

## 2016-08-03 DIAGNOSIS — Z951 Presence of aortocoronary bypass graft: Secondary | ICD-10-CM | POA: Insufficient documentation

## 2016-08-03 DIAGNOSIS — R0789 Other chest pain: Secondary | ICD-10-CM | POA: Diagnosis not present

## 2016-08-03 DIAGNOSIS — I10 Essential (primary) hypertension: Secondary | ICD-10-CM | POA: Insufficient documentation

## 2016-08-03 LAB — CBC WITH DIFFERENTIAL/PLATELET
Basophils Absolute: 0 10*3/uL (ref 0.0–0.1)
Basophils Relative: 0 %
EOS ABS: 0 10*3/uL (ref 0.0–0.7)
Eosinophils Relative: 1 %
HCT: 31.4 % — ABNORMAL LOW (ref 36.0–46.0)
HEMOGLOBIN: 10.8 g/dL — AB (ref 12.0–15.0)
Lymphocytes Relative: 36 %
Lymphs Abs: 1.2 10*3/uL (ref 0.7–4.0)
MCH: 33.3 pg (ref 26.0–34.0)
MCHC: 34.4 g/dL (ref 30.0–36.0)
MCV: 96.9 fL (ref 78.0–100.0)
Monocytes Absolute: 0.4 10*3/uL (ref 0.1–1.0)
Monocytes Relative: 11 %
NEUTROS ABS: 1.7 10*3/uL (ref 1.7–7.7)
NEUTROS PCT: 52 %
Platelets: 233 10*3/uL (ref 150–400)
RBC: 3.24 MIL/uL — AB (ref 3.87–5.11)
RDW: 13.7 % (ref 11.5–15.5)
WBC: 3.2 10*3/uL — AB (ref 4.0–10.5)

## 2016-08-03 LAB — COMPREHENSIVE METABOLIC PANEL
ALBUMIN: 3.7 g/dL (ref 3.5–5.0)
ALT: 20 U/L (ref 14–54)
ANION GAP: 9 (ref 5–15)
AST: 34 U/L (ref 15–41)
Alkaline Phosphatase: 34 U/L — ABNORMAL LOW (ref 38–126)
BILIRUBIN TOTAL: 0.7 mg/dL (ref 0.3–1.2)
BUN: 19 mg/dL (ref 6–20)
CO2: 25 mmol/L (ref 22–32)
Calcium: 9.4 mg/dL (ref 8.9–10.3)
Chloride: 98 mmol/L — ABNORMAL LOW (ref 101–111)
Creatinine, Ser: 0.88 mg/dL (ref 0.44–1.00)
GFR calc Af Amer: >60 mL/min (ref >60)
GFR, EST NON AFRICAN AMERICAN: 60 mL/min — AB (ref >60)
GLUCOSE: 89 mg/dL (ref 65–99)
POTASSIUM: 3.5 mmol/L (ref 3.5–5.1)
Sodium: 132 mmol/L — ABNORMAL LOW (ref 135–145)
TOTAL PROTEIN: 8.1 g/dL (ref 6.5–8.1)

## 2016-08-03 LAB — URINALYSIS, ROUTINE W REFLEX MICROSCOPIC
Bilirubin Urine: NEGATIVE
GLUCOSE, UA: NEGATIVE mg/dL
Hgb urine dipstick: NEGATIVE
KETONES UR: NEGATIVE mg/dL
LEUKOCYTES UA: NEGATIVE
Nitrite: NEGATIVE
PROTEIN: NEGATIVE mg/dL
Specific Gravity, Urine: 1.008 (ref 1.005–1.030)
pH: 7.5 (ref 5.0–8.0)

## 2016-08-03 LAB — PROTIME-INR
INR: 1
PROTHROMBIN TIME: 13.2 s (ref 11.4–15.2)

## 2016-08-03 LAB — TROPONIN I: Troponin I: <0.03 ng/mL (ref <0.03)

## 2016-08-03 LAB — LIPASE, BLOOD: LIPASE: 52 U/L — AB (ref 11–51)

## 2016-08-03 MED ORDER — ACETAMINOPHEN 500 MG PO TABS
1000.0000 mg | ORAL_TABLET | Freq: Once | ORAL | Status: AC
  Administered 2016-08-03: 1000 mg via ORAL
  Filled 2016-08-03: qty 2

## 2016-08-03 MED ORDER — IOPAMIDOL (ISOVUE-370) INJECTION 76%
100.0000 mL | Freq: Once | INTRAVENOUS | Status: AC | PRN
  Administered 2016-08-03: 100 mL via INTRAVENOUS

## 2016-08-03 MED ORDER — TRAMADOL HCL 50 MG PO TABS: 50.0000 mg | ORAL_TABLET | Freq: Four times a day (QID) | ORAL | 0 refills | Status: DC | PRN

## 2016-08-03 MED ORDER — ACETAMINOPHEN 500 MG PO TABS: 1000.0000 mg | ORAL_TABLET | Freq: Four times a day (QID) | ORAL | 0 refills | Status: DC | PRN

## 2016-08-03 MED ORDER — TRAMADOL HCL 50 MG PO TABS
50.0000 mg | ORAL_TABLET | Freq: Once | ORAL | Status: AC
  Administered 2016-08-03: 50 mg via ORAL
  Filled 2016-08-03: qty 1

## 2016-08-03 NOTE — ED Notes (Signed)
Pt transported to radiology for CT study.

## 2016-08-03 NOTE — ED Provider Notes (Signed)
[facility] Provider Note   CSN: [id number] Arrival date & time: 08/03/16  1201     History   Chief Complaint Chief Complaint  Patient presents with  . Chest Pain    HPI [name] is a 83 y.o. female.  HPI Patient states she has had chest pain for over a week. She states that it is on the left side of her chest, she indicates the mid thorax. Shee reports is aching and sharp in quality. There is a worsening with certain movements or cough or deep breath. Patient denies she's had fever. She has not had productive cough. Patient has been having elevated blood pressure at her assisted living. She reports her doctor gave her hydralazine to take. She states is not helping with her chest pain. She does not endorse lightheadedness dizziness or syncope.Marland Kitchen He also reports she has PMR and sometimes she wonders if that could cause her pain. Patient does endorse swelling of the left lower extremity. She reports this however has been chronic since her vein harvesting. She denies it seems any different than normal for her. Past Medical History:  Diagnosis Date  . Allergic rhinitis   . Anxiety   . Arthritis   . Asthma   . Coronary artery disease   . Coronary atherosclerosis of autologous vein bypass graft   . Coronary atherosclerosis of unspecified type of vessel, native or graft   . GERD (gastroesophageal reflux disease)   . Hx of CABG   . Hypercholesteremia   . Hyperlipidemia    GOAL LDL <70  . Hypertension   . IBS (irritable bowel syndrome)   . Incontinent of urine   . Intermediate coronary syndrome ([place])   . Mild chronic anemia   . Multiple myeloma (HCC)    Dr. Benay Spice  . Osteopenia   . Ovarian cyst, bilateral    Rt complex  . Overactive bladder   . Panic disorder   . Raynauds syndrome   . Spinal stenosis of lumbar region   . Urine test positive for microalbuminuria 12/2011    Patient Active Problem List   Diagnosis Date Noted  . Chest pain 11/26/2015  .  Polymyalgia rheumatica ([place]) 11/26/2015  . Hypokalemia 11/26/2015  . Mild persistent asthma 11/12/2015  . Coronary artery disease involving autologous artery coronary bypass graft with angina pectoris with documented spasm ([place]) 11/12/2015  . Multiple myeloma in remission ([place]) 11/12/2015  . Current use of beta blocker 11/12/2015  . Gastroesophageal reflux disease without esophagitis 11/12/2015  . Other allergic rhinitis 11/12/2015  . Weight loss, non-intentional 10/03/2015  . Asthma 09/24/2015  . Asthma in adult without complication 48/54/6270  . Seasonal allergic rhinitis due to pollen 09/24/2015  . Cervical radiculopathy 07/25/2015  . Chronic coronary artery disease 07/25/2015  . Coronary artery disease 07/25/2015  . Disturbance in sleep behavior 07/25/2015  . Elevated CK 07/25/2015  . Elevated creatine kinase level 07/25/2015  . Generalized osteoarthritis of hand 07/25/2015  . Other long term (current) drug therapy 07/25/2015  . Overactive bladder 07/25/2015  . Restless leg 07/25/2015  . Restless leg syndrome 07/25/2015  . Sleep disturbances 07/25/2015  . Idiopathic peripheral neuropathy 07/04/2015  . Lumbar radiculopathy, chronic 07/04/2015  . 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 Risk for falls 07/04/2015  . Sleep disturbance 07/04/2015  . Chest pain with moderate risk for cardiac etiology 06/13/2015  . Right sciatic nerve pain 10/19/2014  . Abnormal thyroid blood test 08/25/2014  . Chronic fatigue 08/09/2014  . Multiple myeloma (  [place]) 07/21/2013  . Depression 01/14/2013  . Generalized anxiety disorder 01/14/2013  . Gastroesophageal reflux disease 01/13/2013  . Cyst of ovary 12/17/2010  . KNEE PAIN, BILATERAL 08/15/2010  . Abnormal gait 08/15/2010  . Atherosclerosis of coronary artery bypass graft 10/11/2009  . CHEST PAIN, NON-CARDIAC 10/11/2009  . HIP PAIN, RIGHT 08/13/2009  . GREATER TROCHANTERIC BURSITIS 08/13/2009  . LUMBOSACRAL SPONDYLOSIS WITHOUT MYELOPATHY 11/10/2008    . Osteoarthritis of lumbosacral spine without myelopathy 11/10/2008  . FOOT PAIN, BILATERAL 06/30/2008  . Hyperlipidemia 06/22/2008  . Hypertension 06/22/2008  . Raynaud's disease 06/22/2008  . BURSITIS, LEFT SHOULDER 06/22/2008    Past Surgical History:  Procedure Laterality Date  . BUNIONECTOMY WITH HAMMERTOE RECONSTRUCTION Right 08/04/2012   Procedure:  HAMMERTOE RECONSTRUCTION;  Surgeon: Colin Rhein, MD;  Location: [facility];  Service: Orthopedics;  Laterality: Right;  HAMMER TOE RECONSTRUCTION PROBABLE FLEXOR DIGITORUM LONGUS TO PROXIMAL PHALANX TRANSFER LATERALIZED   . CAPSULOTOMY Right 08/04/2012   Procedure: CAPSULOTOMY;  Surgeon: Colin Rhein, MD;  Location: [facility];  Service: Orthopedics;  Laterality: Right;  RIGHT 2ND TOE METATARSOPHALANGEAL JOINT DORSAL CAPSULOTOMY   . CATARACT EXTRACTION, BILATERAL    . COLONOSCOPY    . CORONARY ANGIOPLASTY WITH STENT PLACEMENT  2014   2 stents  . CORONARY ARTERY BYPASS GRAFT  2007   LIMA to LAD, SVG to RCA  non-ST segment MI 08/2009 (Dr. Tamala Julian)  . DILATION AND CURETTAGE OF UTERUS    . EYE SURGERY     both cataracts  . HAMMER TOE SURGERY Right 08/2012  . LEFT HEART CATHETERIZATION WITH CORONARY/GRAFT ANGIOGRAM N/A 07/09/2012   Procedure: LEFT HEART CATHETERIZATION WITH Beatrix Fetters;  Surgeon: Sinclair Grooms, MD;  Location: [facility] CATH LAB;  Service: Cardiovascular;  Laterality: N/A;  . TONSILLECTOMY AND ADENOIDECTOMY    . UPPER GASTROINTESTINAL ENDOSCOPY      OB History    No data available       Home Medications    Prior to Admission medications   Medication Sig Start Date End Date Taking? Authorizing Provider  acetaminophen (TYLENOL) 500 MG tablet Take 1,000 mg by mouth 2 (two) times daily.     Historical Provider, MD  acetaminophen (TYLENOL) 500 MG tablet Take 2 tablets (1,000 mg total) by mouth every 6 (six) hours as needed. 08/03/16   Charlesetta Shanks, MD  Albuterol Sulfate  (PROAIR RESPICLICK IN) Inhale 2 puffs into the lungs every 6 (six) hours as needed (for shortness of breath).     Historical Provider, MD  amLODipine (NORVASC) 5 MG tablet Take 0.5 tablets (2.5 mg total) by mouth daily. 07/18/16   Kinnie Feil, PA-C  aspirin EC 81 MG tablet Take 1 tablet (81 mg total) by mouth daily. 01/21/16   Minus Breeding, MD  Biotin 5000 MCG CAPS Take 5,000 mg by mouth daily.     Historical Provider, MD  budesonide (RHINOCORT AQUA) 32 MCG/ACT nasal spray USE ONE-TWO SPRAYS EACH NOSTRILS TWICE A DAY AS NEEDED. 05/06/16   Charlies Silvers, MD  calcium carbonate (OS-CAL) 600 MG TABS tablet Take 600 mg by mouth daily with breakfast.    Historical Provider, MD  calcium-vitamin D (OSCAL WITH D) 500-200 MG-UNIT tablet Take 1 tablet by mouth.    Historical Provider, MD  desmopressin (DDAVP) 0.1 MG tablet Take 0.1 mg by mouth at bedtime.    Historical Provider, MD  desmopressin (DDAVP) 0.1 MG tablet Take 0.1 mg by mouth as directed. 07/10/16  Historical Provider, MD  donepezil (ARICEPT) 5 MG tablet Take 5 mg by mouth at bedtime.    Historical Provider, MD  famotidine (PEPCID) 40 MG tablet Take 20-40 mg by mouth 2 (two) times daily. 20 mg every morning and 40 mg every evening    Historical Provider, MD  hydrALAZINE (APRESOLINE) 25 MG tablet Take 25 mg by mouth 3 (three) times daily.    Historical Provider, MD  isosorbide mononitrate (IMDUR) 60 MG 24 hr tablet Take 60 mg by mouth daily.    Historical Provider, MD  levocetirizine (XYZAL) 5 MG tablet Take 5 mg by mouth every evening.    Historical Provider, MD  LORazepam (ATIVAN) 1 MG tablet Take 0.5 mg by mouth at bedtime.  11/08/15   Historical Provider, MD  memantine (NAMENDA) 10 MG tablet Take 10 mg by mouth daily.    Historical Provider, MD  metoprolol tartrate (LOPRESSOR) 25 MG tablet Take 25 mg by mouth 2 (two) times daily.    Historical Provider, MD  mirabegron ER (MYRBETRIQ) 25 MG TB24 tablet Take 25 mg by mouth every morning.      Historical Provider, MD  montelukast (SINGULAIR) 10 MG tablet Take 10 mg by mouth at bedtime.     Historical Provider, MD  Multiple Vitamin (MULITIVITAMIN WITH MINERALS) TABS Take 1 tablet by mouth daily.    Historical Provider, MD  nitroGLYCERIN (NITROSTAT) 0.4 MG SL tablet Place 0.4 mg under the tongue every 5 (five) minutes as needed for chest pain. Reported on 11/12/2015    Historical Provider, MD  Polyethyl Glycol-Propyl Glycol (SYSTANE OP) Apply 1 drop to eye daily as needed (for dry eyes).    Historical Provider, MD  polyethylene glycol (MIRALAX / GLYCOLAX) packet Take 8.5 g by mouth at bedtime.     Historical Provider, MD  polyethylene glycol powder (GLYCOLAX/MIRALAX) powder Take 17 g by mouth as directed.    Historical Provider, MD  prednisoLONE acetate (PRED FORTE) 1 % ophthalmic suspension Place 1 drop into both eyes 2 (two) times daily.  06/16/16   Historical Provider, MD  rosuvastatin (CRESTOR) 5 MG tablet Take 1 tablet (5 mg total) by mouth daily at 6 PM. 06/10/16   Minus Breeding, MD  sertraline (ZOLOFT) 25 MG tablet Take 25 mg by mouth daily.    Historical Provider, MD  sertraline (ZOLOFT) 50 MG tablet Take 50 mg by mouth as directed.    Historical Provider, MD  traMADol (ULTRAM) 50 MG tablet Take 1 tablet (50 mg total) by mouth every 6 (six) hours as needed. 1-2 tablets every 6 hours with tylenol 08/03/16   Charlesetta Shanks, MD    Family History Family History  Problem Relation Age of Onset  . Heart attack Mother   . CAD Mother   . Obesity Sister   . Cancer Paternal Aunt     breast  . Asthma Neg Hx   . Eczema Neg Hx   . Urticaria Neg Hx   . Allergic rhinitis Neg Hx   . Angioedema Neg Hx   . Atopy Neg Hx   . Immunodeficiency Neg Hx     Social History Social History  Substance Use Topics  . Smoking status: Never Smoker  . Smokeless tobacco: Never Used  . Alcohol use Yes     Comment: occasional     Allergies   Butazolidin [phenylbutazone]; Cephalosporins; Codeine;  Levaquin [levofloxacin]; Macrodantin; Morphine and related; Trimethoprim; Cilostazol; Clindamycin; Dexilant [dexlansoprazole]; Erythromycin; Morphine; Nitrofurantoin; Simvastatin; Sulfa antibiotics; Vesicare [solifenacin]; Clindamycin/lincomycin; Food; Ibuprofen; Lincomycin  hcl; Penicillins; Toviaz [fesoterodine fumarate er]; and Voltaren [diclofenac sodium]   Review of Systems Review of Systems 10 Systems reviewed and are negative for acute change except as noted in the HPI.   Physical Exam Updated Vital Signs BP 151/74   Pulse (!) 53   Temp 97.9 F (36.6 C) (Oral)   Resp 18   Ht '5\' 3"'  (1.6 m)   Wt 99 lb (44.9 kg)   SpO2 99%   BMI 17.54 kg/m   Physical Exam  Constitutional: She is oriented to person, place, and time. She appears well-developed and well-nourished. No distress.  HENT:  Head: Normocephalic and atraumatic.  Mouth/Throat: Oropharynx is clear and moist.  Eyes: Conjunctivae and EOM are normal. Pupils are equal, round, and reactive to light.  Neck: Neck supple.  Cardiovascular: Normal rate and regular rhythm.   No murmur heard. Pulmonary/Chest: Effort normal and breath sounds normal. No respiratory distress.  Minimal left chest wall tenderness. No palpable anomalies. No rashes. No soft tissue irregularity.  Abdominal: Soft. She exhibits no distension and no mass. There is no tenderness. There is no guarding.  Musculoskeletal: Normal range of motion. She exhibits edema.  Pitting edema foot and lower leg on the left. Right is normal.  Neurological: She is alert and oriented to person, place, and time. No cranial nerve deficit. She exhibits normal muscle tone. Coordination normal.  Skin: Skin is warm and dry.  Psychiatric: She has a normal mood and affect.  Nursing note and vitals reviewed.    ED Treatments / Results  Labs (all labs ordered are listed, but only abnormal results are displayed) Labs Reviewed  COMPREHENSIVE METABOLIC PANEL - Abnormal; Notable for the  following:       Result Value   Sodium 132 (*)    Chloride 98 (*)    Alkaline Phosphatase 34 (*)    GFR calc non Af Amer 60 (*)    All other components within normal limits  LIPASE, BLOOD - Abnormal; Notable for the following:    Lipase 52 (*)    All other components within normal limits  CBC WITH DIFFERENTIAL/PLATELET - Abnormal; Notable for the following:    WBC 3.2 (*)    RBC 3.24 (*)    Hemoglobin 10.8 (*)    HCT 31.4 (*)    All other components within normal limits  TROPONIN I  PROTIME-INR  URINALYSIS, ROUTINE W REFLEX MICROSCOPIC    EKG  EKG Interpretation  Date/Time:  Sunday August 03 2016 12:18:15 EST Ventricular Rate:  54 PR Interval:  164 QRS Duration: 84 QT Interval:  444 QTC Calculation: 421 R Axis:   12 Text Interpretation:  Sinus bradycardia Nonspecific T wave abnormality Abnormal ECG agree. no sig change from previous. Confirmed by Johnney Killian, MD, Jeannie Done [phone]) on 08/03/2016 3:31:14 PM       Radiology No results found.  Procedures Procedures (including critical care time)  Medications Ordered in ED Medications  traMADol (ULTRAM) tablet 50 mg (50 mg Oral Given 08/03/16 1538)  acetaminophen (TYLENOL) tablet 1,000 mg (1,000 mg Oral Given 08/03/16 1538)  iopamidol (ISOVUE-370) 76 % injection 100 mL (100 mLs Intravenous Contrast Given 08/03/16 1602)     Initial Impression / Assessment and Plan / ED Course  I have reviewed the triage vital signs and the nursing notes.  Pertinent labs & imaging results that were available during my care of the patient were reviewed by me and considered in my medical decision making (see chart for details).  Final Clinical Impressions(s) / ED Diagnoses   Final diagnoses:  Chest pain, unspecified type  Diagnostic evaluation is within normal limits. The patient's chest pain is atypical for ischemia. At this time, based on evaluation I most suspect muscle skeletal chest wall pain. Clinically well in appearance.  Patient will be continued tramadol and acetaminophen as needed for pain control and is instructed to follow-up with her primary care doctor.  New Prescriptions Discharge Medication List as of 08/03/2016  5:23 PM    START taking these medications   Details  !! acetaminophen (TYLENOL) 500 MG tablet Take 2 tablets (1,000 mg total) by mouth every 6 (six) hours as needed., Starting Sun 08/03/2016, Print    traMADol (ULTRAM) 50 MG tablet Take 1 tablet (50 mg total) by mouth every 6 (six) hours as needed. 1-2 tablets every 6 hours with tylenol, Starting Sun 08/03/2016, Print     !! - Potential duplicate medications found. Please discuss with provider.       Charlesetta Shanks, MD 08/06/16 [phone]

## 2016-08-03 NOTE — ED Notes (Signed)
Pt ambulatory, in NAD. 

## 2016-08-03 NOTE — ED Notes (Signed)
Dr Johnney Killian in room with pt now.

## 2016-08-03 NOTE — ED Notes (Signed)
Pt brought in by EMS. C/C originally ABD pain, added CP at registration. Has cardiac HX.

## 2016-08-03 NOTE — ED Notes (Signed)
Pt ambulated to restroom without assistance.

## 2016-08-03 NOTE — ED Provider Notes (Signed)
  Physical Exam  BP 177/58 (BP Location: Right Arm)   Pulse (!) 56   Temp 97.9 F (36.6 C) (Oral)   Resp 16   Ht 5\' 3"  (1.6 m)   Wt 99 lb (44.9 kg)   SpO2 100%   BMI 17.54 kg/m   Physical Exam  ED Course  Procedures  MDM CT scan reviewed and reassuring and stable. Will discharge home.       Davonna Belling, MD 08/03/16 [phone]

## 2016-08-03 NOTE — ED Triage Notes (Addendum)
Pain to left side/rib area. Is being treated for UTI, pain radiates to left upper back. Denies recent injury and denies pain at present. Was seen by Primary on Friday

## 2016-08-03 NOTE — ED Notes (Signed)
Pt upset about not having a room. "My husband could have brought me." Pt updated and apologized to about the wait.

## 2016-08-04 DIAGNOSIS — M150 Primary generalized (osteo)arthritis: Secondary | ICD-10-CM | POA: Diagnosis not present

## 2016-08-04 DIAGNOSIS — Z681 Body mass index (BMI) 19 or less, adult: Secondary | ICD-10-CM | POA: Diagnosis not present

## 2016-08-04 DIAGNOSIS — R5383 Other fatigue: Secondary | ICD-10-CM | POA: Diagnosis not present

## 2016-08-04 DIAGNOSIS — M2550 Pain in unspecified joint: Secondary | ICD-10-CM | POA: Diagnosis not present

## 2016-08-04 DIAGNOSIS — R636 Underweight: Secondary | ICD-10-CM | POA: Diagnosis not present

## 2016-08-04 DIAGNOSIS — R0789 Other chest pain: Secondary | ICD-10-CM | POA: Diagnosis not present

## 2016-08-12 ENCOUNTER — Telehealth: Payer: Self-pay | Admitting: Cardiology

## 2016-08-12 NOTE — Telephone Encounter (Signed)
Received call from Towanda Octave, NP with [facility]. She rounds on this patient who is currently in SNF. Notes patient had Rx for hydralazine 25mg  TID by Dr. Percival Spanish. Wanted to inform that patient is getting BID checks of BP, had been having some consistently lower BP readings in the afternoon - [id number] systolic. They cut out her afternoon hydralazine dose and moved her to BID dosing. Wanted to make sure Dr. Percival Spanish was aware. She has f/u on 3/27. If anything in interim is advised, I can contact NP. Routed as [employer]

## 2016-08-13 DIAGNOSIS — I2510 Atherosclerotic heart disease of native coronary artery without angina pectoris: Secondary | ICD-10-CM | POA: Diagnosis not present

## 2016-08-13 DIAGNOSIS — I10 Essential (primary) hypertension: Secondary | ICD-10-CM | POA: Diagnosis not present

## 2016-08-13 DIAGNOSIS — E785 Hyperlipidemia, unspecified: Secondary | ICD-10-CM | POA: Diagnosis not present

## 2016-08-13 DIAGNOSIS — R5382 Chronic fatigue, unspecified: Secondary | ICD-10-CM | POA: Diagnosis not present

## 2016-08-13 NOTE — Telephone Encounter (Signed)
OK 

## 2016-08-13 NOTE — Telephone Encounter (Signed)
Med list updated

## 2016-08-18 DIAGNOSIS — E785 Hyperlipidemia, unspecified: Secondary | ICD-10-CM | POA: Diagnosis not present

## 2016-08-18 DIAGNOSIS — R634 Abnormal weight loss: Secondary | ICD-10-CM | POA: Diagnosis not present

## 2016-08-18 DIAGNOSIS — I10 Essential (primary) hypertension: Secondary | ICD-10-CM | POA: Diagnosis not present

## 2016-08-18 DIAGNOSIS — F0390 Unspecified dementia without behavioral disturbance: Secondary | ICD-10-CM | POA: Diagnosis not present

## 2016-08-20 DIAGNOSIS — R0602 Shortness of breath: Secondary | ICD-10-CM | POA: Diagnosis not present

## 2016-08-20 DIAGNOSIS — R600 Localized edema: Secondary | ICD-10-CM | POA: Diagnosis not present

## 2016-08-21 ENCOUNTER — Encounter: Payer: Self-pay | Admitting: Cardiology

## 2016-08-21 DIAGNOSIS — D649 Anemia, unspecified: Secondary | ICD-10-CM | POA: Diagnosis not present

## 2016-08-21 DIAGNOSIS — I10 Essential (primary) hypertension: Secondary | ICD-10-CM | POA: Diagnosis not present

## 2016-08-21 DIAGNOSIS — R600 Localized edema: Secondary | ICD-10-CM | POA: Diagnosis not present

## 2016-08-21 DIAGNOSIS — R5382 Chronic fatigue, unspecified: Secondary | ICD-10-CM | POA: Diagnosis not present

## 2016-08-25 DIAGNOSIS — D649 Anemia, unspecified: Secondary | ICD-10-CM | POA: Diagnosis not present

## 2016-08-28 DIAGNOSIS — R350 Frequency of micturition: Secondary | ICD-10-CM | POA: Diagnosis not present

## 2016-08-28 DIAGNOSIS — R3915 Urgency of urination: Secondary | ICD-10-CM | POA: Diagnosis not present

## 2016-09-01 NOTE — Progress Notes (Signed)
  Cardiology Office Note   Date:  09/02/2016   ID:  [name], DOB 04/11/1932, MRN [id number]  PCP:  PIAZZA, NICHOLAS A, MD  Cardiologist:    , MD   Chief Complaint  Patient presents with  . Hypertension      History of Present Illness: [name] is a 83 y.o. female who presents of CAD.  She has been seen by Dr. Smith in our practice and Dr. Tyson in [place].  Dr. Tyson is no longer working in the area and Dr. Smith discharged the patient from his practice.   She was most recently admitted to the hospital in June of last year with chest pain but this was not felt to be anginal.   A stress test in [place] two years ago had no ischemia and an EF of 75%.  Since I last saw her she has been in the ED with chest pain.  I reviewed these records for this visit.  There was no evidence of a cardiac etiology for her symptoms.  We have also been called by the nursing home as her BPs were running low and she had her dose of hydralazine reduced.  I have reviewed extensive blood pressure diary. For the most part the blood pressure is well controlled fluctuating between about 110 150 systolic although there have been some blood pressure readings in the 90s and some occasionally in the 170s - 180s. . She has chronic dizziness.     Past Medical History:  Diagnosis Date  . Allergic rhinitis   . Anxiety   . Arthritis   . Asthma   . Coronary artery disease   . Coronary atherosclerosis of autologous vein bypass graft   . Coronary atherosclerosis of unspecified type of vessel, native or graft   . GERD (gastroesophageal reflux disease)   . Hx of CABG   . Hypercholesteremia   . Hyperlipidemia    GOAL LDL <70  . Hypertension   . IBS (irritable bowel syndrome)   . Incontinent of urine   . Intermediate coronary syndrome (HCC)   . Mild chronic anemia   . Multiple myeloma (HCC)    Dr. Sherrill  . Osteopenia   . Ovarian cyst, bilateral    Rt complex  . Overactive  bladder   . Panic disorder   . Raynauds syndrome   . Spinal stenosis of lumbar region   . Urine test positive for microalbuminuria 12/2011    Past Surgical History:  Procedure Laterality Date  . BUNIONECTOMY WITH HAMMERTOE RECONSTRUCTION Right 08/04/2012   Procedure:  HAMMERTOE RECONSTRUCTION;  Surgeon: Paul A Bednarz, MD;  Location: [facility];  Service: Orthopedics;  Laterality: Right;  HAMMER TOE RECONSTRUCTION PROBABLE FLEXOR DIGITORUM LONGUS TO PROXIMAL PHALANX TRANSFER LATERALIZED   . CAPSULOTOMY Right 08/04/2012   Procedure: CAPSULOTOMY;  Surgeon: Paul A Bednarz, MD;  Location: [facility];  Service: Orthopedics;  Laterality: Right;  RIGHT 2ND TOE METATARSOPHALANGEAL JOINT DORSAL CAPSULOTOMY   . CATARACT EXTRACTION, BILATERAL    . COLONOSCOPY    . CORONARY ANGIOPLASTY WITH STENT PLACEMENT  2014   2 stents  . CORONARY ARTERY BYPASS GRAFT  2007   LIMA to LAD, SVG to RCA  non-ST segment MI 08/2009 (Dr. Smith)  . DILATION AND CURETTAGE OF UTERUS    . EYE SURGERY     both cataracts  . HAMMER TOE SURGERY Right 08/2012  . LEFT HEART CATHETERIZATION WITH CORONARY/GRAFT ANGIOGRAM N/A 07/09/2012     Procedure: LEFT HEART CATHETERIZATION WITH CORONARY/GRAFT ANGIOGRAM;  Surgeon: Henry W Smith III, MD;  Location: [facility];  Service: Cardiovascular;  Laterality: N/A;  . TONSILLECTOMY AND ADENOIDECTOMY    . UPPER GASTROINTESTINAL ENDOSCOPY       Current Outpatient Prescriptions  Medication Sig Dispense Refill  . acetaminophen (TYLENOL) 500 MG tablet Take 2 tablets (1,000 mg total) by mouth every 6 (six) hours as needed. 30 tablet 0  . Albuterol Sulfate (PROAIR RESPICLICK IN) Inhale 2 puffs into the lungs every 6 (six) hours as needed (for shortness of breath).     . amLODipine (NORVASC) 5 MG tablet Take 0.5 tablets (2.5 mg total) by mouth daily. 15 tablet 0  . aspirin EC 81 MG tablet Take 1 tablet (81 mg total) by mouth daily. 90 tablet 3  . Biotin 5000 MCG CAPS  Take 5,000 mg by mouth daily.     . budesonide (RHINOCORT AQUA) 32 MCG/ACT nasal spray USE ONE-TWO SPRAYS EACH NOSTRILS TWICE A DAY AS NEEDED. 8.6 g 5  . calcium-vitamin D (OSCAL WITH D) 500-200 MG-UNIT tablet Take 1 tablet by mouth.    . desmopressin (DDAVP) 0.1 MG tablet Take 0.1 mg by mouth at bedtime.    . donepezil (ARICEPT) 5 MG tablet Take 5 mg by mouth at bedtime.    . famotidine (PEPCID) 40 MG tablet Take 20-40 mg by mouth 2 (two) times daily. 20 mg every morning and 40 mg every evening    . hydrALAZINE (APRESOLINE) 25 MG tablet Take 25 mg by mouth 2 (two) times daily.     . isosorbide mononitrate (IMDUR) 60 MG 24 hr tablet Take 60 mg by mouth daily.    . levocetirizine (XYZAL) 5 MG tablet Take 5 mg by mouth every evening.    . LORazepam (ATIVAN) 1 MG tablet Take 0.5 mg by mouth at bedtime.     . memantine (NAMENDA) 10 MG tablet Take 10 mg by mouth daily.    . metoprolol tartrate (LOPRESSOR) 25 MG tablet Take 25 mg by mouth 2 (two) times daily.    . mirabegron ER (MYRBETRIQ) 25 MG TB24 tablet Take 25 mg by mouth every morning.     . montelukast (SINGULAIR) 10 MG tablet Take 10 mg by mouth at bedtime.     . Multiple Vitamin (MULITIVITAMIN WITH MINERALS) TABS Take 1 tablet by mouth daily.    . nitroGLYCERIN (NITROSTAT) 0.4 MG SL tablet Place 0.4 mg under the tongue every 5 (five) minutes as needed for chest pain. Reported on 11/12/2015    . Polyethyl Glycol-Propyl Glycol (SYSTANE OP) Apply 1 drop to eye daily as needed (for dry eyes).    . polyethylene glycol (MIRALAX / GLYCOLAX) packet Take 8.5 g by mouth at bedtime.     . polyethylene glycol powder (GLYCOLAX/MIRALAX) powder Take 17 g by mouth as directed.    . prednisoLONE acetate (PRED FORTE) 1 % ophthalmic suspension Place 1 drop into both eyes 2 (two) times daily.     . rosuvastatin (CRESTOR) 5 MG tablet Take 1 tablet (5 mg total) by mouth daily at 6 PM. 90 tablet 3  . sertraline (ZOLOFT) 25 MG tablet Take 25 mg by mouth daily.    .  sertraline (ZOLOFT) 50 MG tablet Take 50 mg by mouth as directed.    . traMADol (ULTRAM) 50 MG tablet Take 1 tablet (50 mg total) by mouth every 6 (six) hours as needed. 1-2 tablets every 6 hours with tylenol 20 tablet 0  .   hydrALAZINE (APRESOLINE) 10 MG tablet Take 1 tablet (10 mg total) by mouth as needed. Take if Systolic blood pressure over 180 30 tablet 3   No current facility-administered medications for this visit.      ROS:  Please see the history of present illness.   Otherwise, review of systems are positive for none.   All other systems are reviewed and negative.    PHYSICAL EXAM: VS:  BP 108/60   Pulse (!) 54   Ht 5' 3" (1.6 m)   Wt 101 lb (45.8 kg)   BMI 17.89 kg/m  , BMI Body mass index is 17.89 kg/m. GENERAL:  Well appearing but looks her stated age. NECK:  No jugular venous distention, waveform within normal limits, carotid upstroke brisk and symmetric, no bruits, no thyromegaly LUNGS:  Clear to auscultation bilaterally BACK:  No CVA tenderness CHEST:  Unremarkable HEART:  PMI not displaced or sustained,S1 and S2 within normal limits, no S3, no S4, no clicks, no rubs, no murmurs ABD:  Flat, positive bowel sounds normal in frequency in pitch, no bruits, no rebound, no guarding, no midline pulsatile mass, no hepatomegaly, no splenomegaly EXT:  2 plus pulses throughout, no edema, no cyanosis no clubbing   EKG:  EKG is not   ordered today.   Recent Labs: 07/18/2016: B Natriuretic Peptide 393.0; Magnesium 1.9 08/03/2016: ALT 20; BUN 19; Creatinine, Ser 0.88; Hemoglobin 10.8; Platelets 233; Potassium 3.5; Sodium 132    Lipid Panel    Component Value Date/Time   CHOL 128 07/09/2012 0105   TRIG 22 07/09/2012 0105   HDL 73 07/09/2012 0105   CHOLHDL 1.8 07/09/2012 0105   VLDL 4 07/09/2012 0105   LDLCALC 51 07/09/2012 0105      Wt Readings from Last 3 Encounters:  09/02/16 101 lb (45.8 kg)  08/03/16 99 lb (44.9 kg)  07/24/16 99 lb (44.9 kg)      Other studies  Reviewed: Additional studies/ records that were reviewed today include:  ED records, review of BP diary.   Review of the above records demonstrates:     ASSESSMENT AND PLAN:  Atherosclerosis of coronary artery bypass graft of native heart without angina pectoris She's had no new chest discomfort. No change in therapy is indicated.   Her chest pain in the ED was reviewed and sounds to be musculoskeletal.  Essential hypertension Her BP is fluctuating.  She's going to lie down if her blood pressure systolic is 90. If however it is greater than 180 she'll be given written instructions to get 10 mg when necessary hydralazine. When she's feeling dizzy she's going to get them to record her blood pressure so that we can see if this is associated  Hyperlipidemia This is followed by Juluis Rainier A, MD.   I will defer to their management.       Current medicines are reviewed at length with the patient today.  The patient does not have concerns regarding medicines.  The following changes have been made:  As above  Labs/ tests ordered today include:  No orders of the defined types were placed in this encounter.    Disposition:   FU with me in 12 weeks.   Signed, Minus Breeding, MD  09/02/2016 5:07 PM    [place]

## 2016-09-02 ENCOUNTER — Ambulatory Visit (INDEPENDENT_AMBULATORY_CARE_PROVIDER_SITE_OTHER): Payer: PPO | Admitting: Cardiology

## 2016-09-02 ENCOUNTER — Encounter: Payer: Self-pay | Admitting: Cardiology

## 2016-09-02 VITALS — BP 108/60 | HR 54 | Ht 63.0 in | Wt 101.0 lb

## 2016-09-02 DIAGNOSIS — I10 Essential (primary) hypertension: Secondary | ICD-10-CM

## 2016-09-02 DIAGNOSIS — I2510 Atherosclerotic heart disease of native coronary artery without angina pectoris: Secondary | ICD-10-CM

## 2016-09-02 DIAGNOSIS — R0602 Shortness of breath: Secondary | ICD-10-CM | POA: Insufficient documentation

## 2016-09-02 DIAGNOSIS — R42 Dizziness and giddiness: Secondary | ICD-10-CM | POA: Insufficient documentation

## 2016-09-02 MED ORDER — HYDRALAZINE HCL 10 MG PO TABS: 10.0000 mg | ORAL_TABLET | ORAL | 3 refills | Status: DC | PRN

## 2016-09-02 NOTE — Patient Instructions (Signed)
Medication Instructions:  Take Hydralazine 10 mg PO PRN if Systolic Blood Pressure is greater than 180  Labwork: None Ordered  Testing/Procedures: None Ordered  Follow-Up: Your physician recommends that you schedule a follow-up appointment in: 3 Months.   Any Other Special Instructions Will Be Listed Below (If Applicable).   If you need a refill on your cardiac medications before your next appointment, please call your pharmacy.

## 2016-09-03 ENCOUNTER — Telehealth: Payer: Self-pay | Admitting: Cardiology

## 2016-09-03 NOTE — Telephone Encounter (Signed)
Returned call to [place] at [facility].  Reports patient received new rx for hydralazine 10mg  PRN.  Wondering if this is in addition to her current dose of hydralazine 25mg  BID and how often daily she can take it.  Advised that the PRN dose is in addition to her current dose.  Advised that this could be PRN daily but would verify how many doses she could take as needed per day.    Routed to MD to clarify order.

## 2016-09-03 NOTE — Telephone Encounter (Signed)
Spoke with cheryl at [facility], I asked her how often was pt blood pressure being check she said twice daily, told her Dr Percival Spanish does not want that to change to continue checking twice a day and only when her Systolic blood pressure is above 180 to give that extra dose of Hydralazine 10 mg, Cheryl voice understanding.Marland Kitchen

## 2016-09-03 NOTE — Telephone Encounter (Signed)
New Message  Pt c/o medication issue:  1. Name of Medication: Hydralazine   2. How are you currently taking this medication (dosage and times per day)?10mg   3. Are you having a reaction (difficulty breathing--STAT)? No  4. What is your medication issue? Called to know the frequency of the medication. Please call back to discuss

## 2016-09-04 ENCOUNTER — Other Ambulatory Visit: Payer: Self-pay | Admitting: Obstetrics and Gynecology

## 2016-09-04 ENCOUNTER — Other Ambulatory Visit (HOSPITAL_COMMUNITY)
Admission: RE | Admit: 2016-09-04 | Discharge: 2016-09-04 | Disposition: A | Discharge status: Home / Self Care | Payer: PPO | Source: Ambulatory Visit | Attending: Obstetrics and Gynecology | Admitting: Obstetrics and Gynecology

## 2016-09-04 DIAGNOSIS — Z8541 Personal history of malignant neoplasm of cervix uteri: Secondary | ICD-10-CM | POA: Diagnosis not present

## 2016-09-04 DIAGNOSIS — Z9189 Other specified personal risk factors, not elsewhere classified: Secondary | ICD-10-CM | POA: Diagnosis not present

## 2016-09-04 DIAGNOSIS — Z01419 Encounter for gynecological examination (general) (routine) without abnormal findings: Secondary | ICD-10-CM | POA: Diagnosis not present

## 2016-09-04 DIAGNOSIS — Z1151 Encounter for screening for human papillomavirus (HPV): Secondary | ICD-10-CM | POA: Insufficient documentation

## 2016-09-04 DIAGNOSIS — N952 Postmenopausal atrophic vaginitis: Secondary | ICD-10-CM | POA: Diagnosis not present

## 2016-09-04 DIAGNOSIS — R87810 Cervical high risk human papillomavirus (HPV) DNA test positive: Secondary | ICD-10-CM | POA: Insufficient documentation

## 2016-09-10 LAB — CYTOLOGY - PAP
Diagnosis: NEGATIVE
HPV (WINDOPATH): DETECTED — AB
HPV 16/18/45 genotyping: NEGATIVE

## 2016-09-15 ENCOUNTER — Telehealth: Payer: Self-pay | Admitting: Allergy

## 2016-09-15 NOTE — Telephone Encounter (Signed)
Patient called and wanted [name] to call her in another medicine for her allergies.Informed her to have the nurse at [facility] to call us and let us know what was going on or have her daughter call. Informed patient it was about time for her to make appointment. Patient said she would call back.

## 2016-09-22 DIAGNOSIS — D649 Anemia, unspecified: Secondary | ICD-10-CM | POA: Diagnosis not present

## 2016-09-30 ENCOUNTER — Other Ambulatory Visit: Payer: Self-pay | Admitting: Allergy

## 2016-09-30 MED ORDER — LEVOCETIRIZINE DIHYDROCHLORIDE 5 MG PO TABS: 5.0000 mg | ORAL_TABLET | Freq: Every evening | ORAL | 5 refills | Status: DC

## 2016-10-23 DIAGNOSIS — G309 Alzheimer's disease, unspecified: Secondary | ICD-10-CM | POA: Diagnosis not present

## 2016-10-23 DIAGNOSIS — F0150 Vascular dementia without behavioral disturbance: Secondary | ICD-10-CM | POA: Diagnosis not present

## 2016-10-23 DIAGNOSIS — M5416 Radiculopathy, lumbar region: Secondary | ICD-10-CM | POA: Diagnosis not present

## 2016-10-23 DIAGNOSIS — F0280 Dementia in other diseases classified elsewhere without behavioral disturbance: Secondary | ICD-10-CM | POA: Diagnosis not present

## 2016-10-23 DIAGNOSIS — G609 Hereditary and idiopathic neuropathy, unspecified: Secondary | ICD-10-CM | POA: Diagnosis not present

## 2016-10-28 ENCOUNTER — Telehealth: Payer: Self-pay | Admitting: Cardiology

## 2016-10-28 NOTE — Telephone Encounter (Signed)
Received records from [facility] for appointment on 11/25/16 with Dr Percival Spanish.  Records put with Dr Hochrein's schedule for 11/25/16. lp

## 2016-11-12 DIAGNOSIS — N390 Urinary tract infection, site not specified: Secondary | ICD-10-CM | POA: Diagnosis not present

## 2016-11-12 DIAGNOSIS — R1084 Generalized abdominal pain: Secondary | ICD-10-CM | POA: Diagnosis not present

## 2016-11-24 NOTE — Progress Notes (Signed)
Cardiology Office Note   Date:  11/26/2016   ID:  Tammy, [name] [birth year], MRN [id number]  PCP:  Lucille Passy, MD  Cardiologist:   Minus Breeding, MD   Chief Complaint  Patient presents with  . Hypertension      History of Present Illness: [name] is a 83 y.o. female who presents of CAD.  She was most recently admitted to the hospital in June of last year with chest pain but this was not felt to be anginal.   A stress test in [facility] two years ago had no ischemia and an EF of 75%.  Since I last saw her she has done relatively well.  She is doing organized low impact exercises.  She does well with this.  The patient denies any new symptoms such as chest discomfort, neck or arm discomfort. There has been no new shortness of breath, PND or orthopnea. There have been no reported palpitations, presyncope or syncope.  She does have fluctuating BPs and has to take a hydralazine about once per week.  I reviewed an extensive BP diary.  For the most part the SBP is within an acceptable range.     Past Medical History:  Diagnosis Date  . Allergic rhinitis   . Anxiety   . Arthritis   . Asthma   . Coronary artery disease   . Coronary atherosclerosis of autologous vein bypass graft   . Coronary atherosclerosis of unspecified type of vessel, native or graft   . GERD (gastroesophageal reflux disease)   . Hx of CABG   . Hypercholesteremia   . Hyperlipidemia    GOAL LDL <70  . Hypertension   . IBS (irritable bowel syndrome)   . Incontinent of urine   . Intermediate coronary syndrome ([place])   . Mild chronic anemia   . Multiple myeloma (HCC)    Dr. Benay Spice  . Osteopenia   . Ovarian cyst, bilateral    Rt complex  . Overactive bladder   . Panic disorder   . Raynauds syndrome   . Spinal stenosis of lumbar region   . Urine test positive for microalbuminuria 12/2011    Past Surgical History:  Procedure Laterality Date  . BUNIONECTOMY WITH HAMMERTOE  RECONSTRUCTION Right 08/04/2012   Procedure:  HAMMERTOE RECONSTRUCTION;  Surgeon: Colin Rhein, MD;  Location: [facility];  Service: Orthopedics;  Laterality: Right;  HAMMER TOE RECONSTRUCTION PROBABLE FLEXOR DIGITORUM LONGUS TO PROXIMAL PHALANX TRANSFER LATERALIZED   . CAPSULOTOMY Right 08/04/2012   Procedure: CAPSULOTOMY;  Surgeon: Colin Rhein, MD;  Location: [facility];  Service: Orthopedics;  Laterality: Right;  RIGHT 2ND TOE METATARSOPHALANGEAL JOINT DORSAL CAPSULOTOMY   . CATARACT EXTRACTION, BILATERAL    . COLONOSCOPY    . CORONARY ANGIOPLASTY WITH STENT PLACEMENT  2014   2 stents  . CORONARY ARTERY BYPASS GRAFT  2007   LIMA to LAD, SVG to RCA  non-ST segment MI 08/2009 (Dr. Tamala Julian)  . DILATION AND CURETTAGE OF UTERUS    . EYE SURGERY     both cataracts  . HAMMER TOE SURGERY Right 08/2012  . LEFT HEART CATHETERIZATION WITH CORONARY/GRAFT ANGIOGRAM N/A 07/09/2012   Procedure: LEFT HEART CATHETERIZATION WITH Beatrix Fetters;  Surgeon: Sinclair Grooms, MD;  [facility] C[facility];  Service: Cardiovascular;  Laterality: N/A;  . TONSILLECTOMY AND ADENOIDECTOMY    . UPPER GASTROINTESTINAL ENDOSCOPY       Current Outpatient Prescriptions  Medication  Sig Dispense Refill  . acetaminophen (TYLENOL) 500 MG tablet Take 2 tablets (1,000 mg total) by mouth every 6 (six) hours as needed. 30 tablet 0  . Albuterol Sulfate (PROAIR RESPICLICK IN) Inhale 2 puffs into the lungs every 6 (six) hours as needed (for shortness of breath).     Marland Kitchen amLODipine (NORVASC) 5 MG tablet Take 0.5 tablets (2.5 mg total) by mouth daily. 15 tablet 0  . aspirin EC 81 MG tablet Take 1 tablet (81 mg total) by mouth daily. 90 tablet 3  . Biotin 5000 MCG CAPS Take 5,000 mg by mouth daily.     . budesonide (RHINOCORT AQUA) 32 MCG/ACT nasal spray USE ONE-TWO SPRAYS EACH NOSTRILS TWICE A DAY AS NEEDED. 8.6 g 5  . calcium-vitamin D (OSCAL WITH D) 500-200 MG-UNIT tablet Take 1 tablet by  mouth.    . desmopressin (DDAVP) 0.1 MG tablet Take 0.1 mg by mouth at bedtime.    . docusate sodium (COLACE) 100 MG capsule Take 100 mg by mouth daily.    Marland Kitchen donepezil (ARICEPT) 5 MG tablet Take 5 mg by mouth at bedtime.    . famotidine (PEPCID) 40 MG tablet Take 20-40 mg by mouth 2 (two) times daily. 20 mg every morning and 40 mg every evening    . hydrALAZINE (APRESOLINE) 25 MG tablet Take 25 mg by mouth 2 (two) times daily.     . isosorbide mononitrate (IMDUR) 60 MG 24 hr tablet Take 60 mg by mouth daily.    Marland Kitchen levocetirizine (XYZAL) 5 MG tablet Take 1 tablet (5 mg total) by mouth every evening. 30 tablet 5  . LORazepam (ATIVAN) 1 MG tablet Take 0.5 mg by mouth at bedtime.     . memantine (NAMENDA) 10 MG tablet Take 10 mg by mouth daily.    . metoprolol tartrate (LOPRESSOR) 25 MG tablet Take 25 mg by mouth 2 (two) times daily.    . mirabegron ER (MYRBETRIQ) 25 MG TB24 tablet Take 25 mg by mouth every morning.     . montelukast (SINGULAIR) 10 MG tablet Take 10 mg by mouth at bedtime.     . Multiple Vitamin (MULITIVITAMIN WITH MINERALS) TABS Take 1 tablet by mouth daily.    . nitroGLYCERIN (NITROSTAT) 0.4 MG SL tablet Place 0.4 mg under the tongue every 5 (five) minutes as needed for chest pain. Reported on 11/12/2015    . Polyethyl Glycol-Propyl Glycol (SYSTANE OP) Apply 1 drop to eye daily as needed (for dry eyes).    . polyethylene glycol (MIRALAX / GLYCOLAX) packet Take 8.5 g by mouth at bedtime.     . polyethylene glycol powder (GLYCOLAX/MIRALAX) powder Take 17 g by mouth as directed.    . prednisoLONE acetate (PRED FORTE) 1 % ophthalmic suspension Place 1 drop into both eyes 2 (two) times daily.     . rosuvastatin (CRESTOR) 5 MG tablet Take 1 tablet (5 mg total) by mouth daily at 6 PM. 90 tablet 3  . sertraline (ZOLOFT) 25 MG tablet Take 25 mg by mouth daily.    . sertraline (ZOLOFT) 50 MG tablet Take 50 mg by mouth as directed.    . traMADol (ULTRAM) 50 MG tablet Take 1 tablet (50 mg  total) by mouth every 6 (six) hours as needed. 1-2 tablets every 6 hours with tylenol 20 tablet 0  . hydrALAZINE (APRESOLINE) 10 MG tablet Take 1 tablet (10 mg total) by mouth as needed. Take if Systolic blood pressure over 180 30 tablet 3  No current facility-administered medications for this visit.      ROS:  Please see the history of present illness.   Otherwise, review of systems are positive for none.   All other systems are reviewed and negative.    PHYSICAL EXAM: VS:  BP (!) 84/52   Pulse (!) 54   Ht '5\' 3"'  (1.6 m)   Wt 102 lb (46.3 kg)   BMI 18.07 kg/m  , BMI Body mass index is 18.07 kg/m.  GENERAL:  Well appearing NECK:  No jugular venous distention, waveform within normal limits, carotid upstroke brisk and symmetric, no bruits, no thyromegaly LUNGS:  Clear to auscultation bilaterally CHEST:  Unremarkable HEART:  PMI not displaced or sustained,S1 and S2 within normal limits, no S3, no S4, no clicks, no rubs, no murmurs ABD:  Flat, positive bowel sounds normal in frequency in pitch, no bruits, no rebound, no guarding, no midline pulsatile mass, no hepatomegaly, no splenomegaly EXT:  2 plus pulses throughout, no edema, no cyanosis no clubbing   EKG:  EKG is not   ordered today.    Recent Labs: 07/18/2016: B Natriuretic Peptide 393.0; Magnesium 1.9 08/03/2016: ALT 20; BUN 19; Creatinine, Ser 0.88; Hemoglobin 10.8; Platelets 233; Potassium 3.5; Sodium 132    Lipid Panel    Component Value Date/Time   CHOL 128 07/09/2012 0105   TRIG 22 07/09/2012 0105   HDL 73 07/09/2012 0105   CHOLHDL 1.8 07/09/2012 0105   VLDL 4 07/09/2012 0105   LDLCALC 51 07/09/2012 0105      Wt Readings from Last 3 Encounters:  11/25/16 102 lb (46.3 kg)  09/02/16 101 lb (45.8 kg)  08/03/16 99 lb (44.9 kg)      Other studies Reviewed: Additional studies/ records that were reviewed today include:  ED records, review of BP diary.   Review of the above records demonstrates:     ASSESSMENT  AND PLAN:  Atherosclerosis of coronary artery bypass graft of native heart without angina pectoris She's had no new chest discomfort.  No further testing or change in therapy is planned.   Essential hypertension Her BP is fluctuating.  However, I do not want to titrate her because I am leery of causing any symptomatic low BPs.  She does have some systolic readings in the 74B.  I have told her she no longer needs to have the BP checked twice daily.  She will continue hydralazine PRN.   Hyperlipidemia This is followed by Lucille Passy, MD.    I will defer to their management.       Current medicines are reviewed at length with the patient today.  The patient does not have concerns regarding medicines.  The following changes have been made:  None  Labs/ tests ordered today include: None No orders of the defined types were placed in this encounter.    Disposition:   FU with me in 6 months.  Signed, Minus Breeding, MD  11/26/2016 6:23 PM    [place]

## 2016-11-25 ENCOUNTER — Ambulatory Visit: Payer: PPO | Admitting: Cardiology

## 2016-11-25 ENCOUNTER — Ambulatory Visit (INDEPENDENT_AMBULATORY_CARE_PROVIDER_SITE_OTHER): Payer: PPO | Admitting: Cardiology

## 2016-11-25 VITALS — BP 84/52 | HR 54 | Ht 63.0 in | Wt 102.0 lb

## 2016-11-25 DIAGNOSIS — E785 Hyperlipidemia, unspecified: Secondary | ICD-10-CM

## 2016-11-25 DIAGNOSIS — I10 Essential (primary) hypertension: Secondary | ICD-10-CM | POA: Diagnosis not present

## 2016-11-25 NOTE — Patient Instructions (Signed)

## 2016-11-26 ENCOUNTER — Telehealth: Payer: Self-pay | Admitting: Cardiology

## 2016-11-26 ENCOUNTER — Encounter: Payer: Self-pay | Admitting: Cardiology

## 2016-11-26 MED ORDER — HYDRALAZINE HCL 10 MG PO TABS: 10.0000 mg | ORAL_TABLET | ORAL | 3 refills | Status: DC | PRN

## 2016-11-26 NOTE — Telephone Encounter (Signed)
Returned call to [facility]. They have an order on file for Hydralazine 10 mg BID and PRN, as well as Hydralazone 25 mg BID. Also inquired about reduced BP checks to 4x a week.  [employer] that the Hydralazine 10 mg should be taken only as needed per our records, will fax over new order. Also recommended that her blood pressure be taken at least once daily. Malachy Mood verbalized understanding and agreed with plan. Fax number provided [phone].

## 2016-11-26 NOTE — Telephone Encounter (Signed)
New message    Pt c/o medication issue:  1. Name of Medication: hydrALAZINE (APRESOLINE) 10 MG tablet & hydrALAZINE (APRESOLINE) 25 MG tablet  2. How are you currently taking this medication (dosage and times per day)?  25 mg - twice a day /  10- as needed   3. Are you having a reaction (difficulty breathing--STAT)? No   4. What is your medication issue?  Question if blood pressure are not to be taken everyday  - b/p check decrease to 4 x a week.

## 2016-11-26 NOTE — Addendum Note (Signed)
Addended by: Diana Eves on: 11/26/2016 02:18 PM   Modules accepted: Orders

## 2016-12-16 ENCOUNTER — Ambulatory Visit: Payer: PPO | Admitting: Cardiology

## 2017-01-09 NOTE — Telephone Encounter (Signed)
error 

## 2017-01-19 DIAGNOSIS — M7622 Iliac crest spur, left hip: Secondary | ICD-10-CM | POA: Diagnosis not present

## 2017-01-19 DIAGNOSIS — M150 Primary generalized (osteo)arthritis: Secondary | ICD-10-CM | POA: Diagnosis not present

## 2017-01-19 DIAGNOSIS — M2550 Pain in unspecified joint: Secondary | ICD-10-CM | POA: Diagnosis not present

## 2017-01-19 DIAGNOSIS — Z681 Body mass index (BMI) 19 or less, adult: Secondary | ICD-10-CM | POA: Diagnosis not present

## 2017-01-19 DIAGNOSIS — M353 Polymyalgia rheumatica: Secondary | ICD-10-CM | POA: Diagnosis not present

## 2017-01-20 DIAGNOSIS — D649 Anemia, unspecified: Secondary | ICD-10-CM | POA: Diagnosis not present

## 2017-01-31 ENCOUNTER — Encounter (HOSPITAL_COMMUNITY): Payer: Self-pay | Admitting: Emergency Medicine

## 2017-01-31 ENCOUNTER — Emergency Department (HOSPITAL_COMMUNITY): Payer: PPO

## 2017-01-31 ENCOUNTER — Inpatient Hospital Stay (HOSPITAL_COMMUNITY)
Admission: EM | Admit: 2017-01-31 | Discharge: 2017-02-05 | DRG: 247 | Disposition: A | Discharge status: Skilled Nursing Facility | Payer: PPO | Attending: Cardiovascular Disease | Admitting: Cardiovascular Disease

## 2017-01-31 DIAGNOSIS — I252 Old myocardial infarction: Secondary | ICD-10-CM

## 2017-01-31 DIAGNOSIS — E7800 Pure hypercholesterolemia, unspecified: Secondary | ICD-10-CM | POA: Diagnosis present

## 2017-01-31 DIAGNOSIS — J309 Allergic rhinitis, unspecified: Secondary | ICD-10-CM | POA: Diagnosis present

## 2017-01-31 DIAGNOSIS — Z881 Allergy status to other antibiotic agents status: Secondary | ICD-10-CM

## 2017-01-31 DIAGNOSIS — I214 Non-ST elevation (NSTEMI) myocardial infarction: Secondary | ICD-10-CM | POA: Diagnosis present

## 2017-01-31 DIAGNOSIS — I25810 Atherosclerosis of coronary artery bypass graft(s) without angina pectoris: Secondary | ICD-10-CM | POA: Diagnosis present

## 2017-01-31 DIAGNOSIS — K580 Irritable bowel syndrome with diarrhea: Secondary | ICD-10-CM | POA: Diagnosis present

## 2017-01-31 DIAGNOSIS — I2510 Atherosclerotic heart disease of native coronary artery without angina pectoris: Secondary | ICD-10-CM | POA: Diagnosis not present

## 2017-01-31 DIAGNOSIS — Z8249 Family history of ischemic heart disease and other diseases of the circulatory system: Secondary | ICD-10-CM | POA: Diagnosis not present

## 2017-01-31 DIAGNOSIS — J45909 Unspecified asthma, uncomplicated: Secondary | ICD-10-CM | POA: Diagnosis present

## 2017-01-31 DIAGNOSIS — Z9889 Other specified postprocedural states: Secondary | ICD-10-CM | POA: Diagnosis not present

## 2017-01-31 DIAGNOSIS — R079 Chest pain, unspecified: Secondary | ICD-10-CM | POA: Diagnosis present

## 2017-01-31 DIAGNOSIS — R54 Age-related physical debility: Secondary | ICD-10-CM | POA: Diagnosis present

## 2017-01-31 DIAGNOSIS — I351 Nonrheumatic aortic (valve) insufficiency: Secondary | ICD-10-CM | POA: Diagnosis not present

## 2017-01-31 DIAGNOSIS — Y831 Surgical operation with implant of artificial internal device as the cause of abnormal reaction of the patient, or of later complication, without mention of misadventure at the time of the procedure: Secondary | ICD-10-CM | POA: Diagnosis present

## 2017-01-31 DIAGNOSIS — I7300 Raynaud's syndrome without gangrene: Secondary | ICD-10-CM | POA: Diagnosis present

## 2017-01-31 DIAGNOSIS — K219 Gastro-esophageal reflux disease without esophagitis: Secondary | ICD-10-CM | POA: Diagnosis present

## 2017-01-31 DIAGNOSIS — Z880 Allergy status to penicillin: Secondary | ICD-10-CM

## 2017-01-31 DIAGNOSIS — I25708 Atherosclerosis of coronary artery bypass graft(s), unspecified, with other forms of angina pectoris: Secondary | ICD-10-CM | POA: Diagnosis not present

## 2017-01-31 DIAGNOSIS — Z7982 Long term (current) use of aspirin: Secondary | ICD-10-CM | POA: Diagnosis not present

## 2017-01-31 DIAGNOSIS — R0789 Other chest pain: Secondary | ICD-10-CM | POA: Diagnosis not present

## 2017-01-31 DIAGNOSIS — Z885 Allergy status to narcotic agent status: Secondary | ICD-10-CM

## 2017-01-31 DIAGNOSIS — Z886 Allergy status to analgesic agent status: Secondary | ICD-10-CM | POA: Diagnosis not present

## 2017-01-31 DIAGNOSIS — T82855A Stenosis of coronary artery stent, initial encounter: Secondary | ICD-10-CM | POA: Diagnosis present

## 2017-01-31 DIAGNOSIS — I25110 Atherosclerotic heart disease of native coronary artery with unstable angina pectoris: Secondary | ICD-10-CM | POA: Diagnosis not present

## 2017-01-31 DIAGNOSIS — C9000 Multiple myeloma not having achieved remission: Secondary | ICD-10-CM | POA: Diagnosis present

## 2017-01-31 DIAGNOSIS — Z882 Allergy status to sulfonamides status: Secondary | ICD-10-CM

## 2017-01-31 DIAGNOSIS — Z79899 Other long term (current) drug therapy: Secondary | ICD-10-CM | POA: Diagnosis not present

## 2017-01-31 DIAGNOSIS — Z955 Presence of coronary angioplasty implant and graft: Secondary | ICD-10-CM

## 2017-01-31 DIAGNOSIS — R0602 Shortness of breath: Secondary | ICD-10-CM | POA: Diagnosis present

## 2017-01-31 DIAGNOSIS — Z951 Presence of aortocoronary bypass graft: Secondary | ICD-10-CM | POA: Diagnosis not present

## 2017-01-31 DIAGNOSIS — I10 Essential (primary) hypertension: Secondary | ICD-10-CM | POA: Diagnosis present

## 2017-01-31 DIAGNOSIS — F410 Panic disorder [episodic paroxysmal anxiety] without agoraphobia: Secondary | ICD-10-CM | POA: Diagnosis present

## 2017-01-31 DIAGNOSIS — E785 Hyperlipidemia, unspecified: Secondary | ICD-10-CM | POA: Diagnosis not present

## 2017-01-31 DIAGNOSIS — D649 Anemia, unspecified: Secondary | ICD-10-CM | POA: Diagnosis not present

## 2017-01-31 DIAGNOSIS — E876 Hypokalemia: Secondary | ICD-10-CM | POA: Diagnosis present

## 2017-01-31 DIAGNOSIS — I25700 Atherosclerosis of coronary artery bypass graft(s), unspecified, with unstable angina pectoris: Secondary | ICD-10-CM | POA: Diagnosis not present

## 2017-01-31 DIAGNOSIS — Z888 Allergy status to other drugs, medicaments and biological substances status: Secondary | ICD-10-CM

## 2017-01-31 HISTORY — DX: Non-ST elevation (NSTEMI) myocardial infarction: I21.4

## 2017-01-31 LAB — BASIC METABOLIC PANEL
ANION GAP: 10 (ref 5–15)
BUN: 18 mg/dL (ref 6–20)
CHLORIDE: 101 mmol/L (ref 101–111)
CO2: 25 mmol/L (ref 22–32)
Calcium: 9.1 mg/dL (ref 8.9–10.3)
Creatinine, Ser: 0.79 mg/dL (ref 0.44–1.00)
GFR calc Af Amer: >60 mL/min (ref >60)
Glucose, Bld: 93 mg/dL (ref 65–99)
POTASSIUM: 3.3 mmol/L — AB (ref 3.5–5.1)
Sodium: 136 mmol/L (ref 135–145)

## 2017-01-31 LAB — HEPATIC FUNCTION PANEL
ALBUMIN: 3.5 g/dL (ref 3.5–5.0)
ALT: 20 U/L (ref 14–54)
AST: 36 U/L (ref 15–41)
Alkaline Phosphatase: 30 U/L — ABNORMAL LOW (ref 38–126)
Total Bilirubin: 0.4 mg/dL (ref 0.3–1.2)
Total Protein: 8 g/dL (ref 6.5–8.1)

## 2017-01-31 LAB — CBC
HEMATOCRIT: 30.3 % — AB (ref 36.0–46.0)
HEMOGLOBIN: 10.3 g/dL — AB (ref 12.0–15.0)
MCH: 32.9 pg (ref 26.0–34.0)
MCHC: 34 g/dL (ref 30.0–36.0)
MCV: 96.8 fL (ref 78.0–100.0)
PLATELETS: 248 10*3/uL (ref 150–400)
RBC: 3.13 MIL/uL — ABNORMAL LOW (ref 3.87–5.11)
RDW: 14 % (ref 11.5–15.5)
WBC: 3.1 10*3/uL — AB (ref 4.0–10.5)

## 2017-01-31 LAB — TROPONIN I
TROPONIN I: 0.06 ng/mL — AB (ref <0.03)
Troponin I: 0.05 ng/mL (ref <0.03)

## 2017-01-31 LAB — I-STAT TROPONIN, ED: Troponin i, poc: 0.02 ng/mL (ref 0.00–0.08)

## 2017-01-31 LAB — HEPARIN LEVEL (UNFRACTIONATED): Heparin Unfractionated: 0.61 IU/mL (ref 0.30–0.70)

## 2017-01-31 LAB — TSH: TSH: 4.08 u[IU]/mL (ref 0.350–4.500)

## 2017-01-31 MED ORDER — DOCUSATE SODIUM 100 MG PO CAPS
100.0000 mg | ORAL_CAPSULE | Freq: Every day | ORAL | Status: DC
  Administered 2017-01-31 – 2017-02-05 (×5): 100 mg via ORAL
  Filled 2017-01-31 (×5): qty 1

## 2017-01-31 MED ORDER — HEPARIN (PORCINE) IN NACL 100-0.45 UNIT/ML-% IJ SOLN
550.0000 [IU]/h | INTRAMUSCULAR | Status: DC
  Administered 2017-01-31: 500 [IU]/h via INTRAVENOUS
  Filled 2017-01-31 (×2): qty 250

## 2017-01-31 MED ORDER — ENOXAPARIN SODIUM 40 MG/0.4ML ~~LOC~~ SOLN
40.0000 mg | Freq: Every day | SUBCUTANEOUS | Status: DC
  Administered 2017-01-31: 40 mg via SUBCUTANEOUS
  Filled 2017-01-31: qty 0.4

## 2017-01-31 MED ORDER — POTASSIUM CHLORIDE CRYS ER 20 MEQ PO TBCR
40.0000 meq | EXTENDED_RELEASE_TABLET | Freq: Once | ORAL | Status: AC
  Administered 2017-01-31: 40 meq via ORAL
  Filled 2017-01-31: qty 2

## 2017-01-31 MED ORDER — RAMELTEON 8 MG PO TABS
8.0000 mg | ORAL_TABLET | Freq: Every day | ORAL | Status: DC
  Administered 2017-01-31 – 2017-02-04 (×5): 8 mg via ORAL
  Filled 2017-01-31 (×7): qty 1

## 2017-01-31 MED ORDER — ROSUVASTATIN CALCIUM 10 MG PO TABS
5.0000 mg | ORAL_TABLET | Freq: Every day | ORAL | Status: DC
  Administered 2017-01-31 – 2017-02-02 (×3): 5 mg via ORAL
  Filled 2017-01-31 (×5): qty 1

## 2017-01-31 MED ORDER — DONEPEZIL HCL 5 MG PO TABS
5.0000 mg | ORAL_TABLET | Freq: Every day | ORAL | Status: DC
  Administered 2017-01-31 – 2017-02-04 (×5): 5 mg via ORAL
  Filled 2017-01-31 (×7): qty 1

## 2017-01-31 MED ORDER — HYDRALAZINE HCL 25 MG PO TABS
25.0000 mg | ORAL_TABLET | Freq: Two times a day (BID) | ORAL | Status: DC
  Administered 2017-01-31 – 2017-02-05 (×11): 25 mg via ORAL
  Filled 2017-01-31 (×11): qty 1

## 2017-01-31 MED ORDER — LORATADINE 10 MG PO TABS
10.0000 mg | ORAL_TABLET | Freq: Every day | ORAL | Status: DC
  Administered 2017-01-31 – 2017-02-05 (×6): 10 mg via ORAL
  Filled 2017-01-31 (×6): qty 1

## 2017-01-31 MED ORDER — AMLODIPINE BESYLATE 5 MG PO TABS
2.5000 mg | ORAL_TABLET | Freq: Every day | ORAL | Status: DC
  Administered 2017-01-31 – 2017-02-05 (×6): 2.5 mg via ORAL
  Filled 2017-01-31 (×6): qty 1

## 2017-01-31 MED ORDER — MIRABEGRON ER 25 MG PO TB24
25.0000 mg | ORAL_TABLET | Freq: Every day | ORAL | Status: DC
  Administered 2017-01-31 – 2017-02-05 (×6): 25 mg via ORAL
  Filled 2017-01-31 (×7): qty 1

## 2017-01-31 MED ORDER — NITROGLYCERIN 0.4 MG SL SUBL
0.4000 mg | SUBLINGUAL_TABLET | SUBLINGUAL | Status: DC | PRN
  Administered 2017-02-02 (×3): 0.4 mg via SUBLINGUAL

## 2017-01-31 MED ORDER — SERTRALINE HCL 50 MG PO TABS
50.0000 mg | ORAL_TABLET | Freq: Every evening | ORAL | Status: DC
  Administered 2017-01-31 – 2017-02-05 (×6): 50 mg via ORAL
  Filled 2017-01-31 (×7): qty 1

## 2017-01-31 MED ORDER — ASPIRIN EC 81 MG PO TBEC
81.0000 mg | DELAYED_RELEASE_TABLET | Freq: Every day | ORAL | Status: DC
  Administered 2017-02-01 – 2017-02-05 (×5): 81 mg via ORAL
  Filled 2017-01-31 (×6): qty 1

## 2017-01-31 MED ORDER — ONDANSETRON HCL 4 MG/2ML IJ SOLN: 4.0000 mg | Freq: Four times a day (QID) | INTRAMUSCULAR | Status: DC | PRN

## 2017-01-31 MED ORDER — FAMOTIDINE 20 MG PO TABS
40.0000 mg | ORAL_TABLET | Freq: Two times a day (BID) | ORAL | Status: DC
  Administered 2017-01-31 – 2017-02-05 (×11): 40 mg via ORAL
  Filled 2017-01-31 (×11): qty 2

## 2017-01-31 MED ORDER — ISOSORBIDE MONONITRATE ER 60 MG PO TB24
60.0000 mg | ORAL_TABLET | Freq: Every day | ORAL | Status: DC
Start: 2017-01-31 — End: 2017-02-05
  Administered 2017-01-31 – 2017-02-05 (×6): 60 mg via ORAL
  Filled 2017-01-31: qty 1
  Filled 2017-01-31 (×2): qty 2
  Filled 2017-01-31: qty 1
  Filled 2017-01-31: qty 2
  Filled 2017-01-31: qty 1

## 2017-01-31 MED ORDER — MEMANTINE HCL 10 MG PO TABS
10.0000 mg | ORAL_TABLET | Freq: Two times a day (BID) | ORAL | Status: DC
  Administered 2017-01-31 – 2017-02-05 (×10): 10 mg via ORAL
  Filled 2017-01-31: qty 2
  Filled 2017-01-31 (×9): qty 1
  Filled 2017-01-31: qty 2
  Filled 2017-01-31 (×2): qty 1
  Filled 2017-01-31: qty 2
  Filled 2017-01-31 (×2): qty 1

## 2017-01-31 MED ORDER — POLYSACCHARIDE IRON COMPLEX 150 MG PO CAPS
150.0000 mg | ORAL_CAPSULE | Freq: Every day | ORAL | Status: DC
  Administered 2017-01-31 – 2017-02-05 (×5): 150 mg via ORAL
  Filled 2017-01-31 (×5): qty 1

## 2017-01-31 MED ORDER — ACETAMINOPHEN 325 MG PO TABS
650.0000 mg | ORAL_TABLET | ORAL | Status: DC | PRN
  Administered 2017-02-01 – 2017-02-02 (×2): 650 mg via ORAL
  Filled 2017-01-31 (×2): qty 2

## 2017-01-31 MED ORDER — GI COCKTAIL ~~LOC~~
30.0000 mL | Freq: Four times a day (QID) | ORAL | Status: DC | PRN
  Administered 2017-01-31: 30 mL via ORAL
  Filled 2017-01-31: qty 30

## 2017-01-31 MED ORDER — LEVOCETIRIZINE DIHYDROCHLORIDE 5 MG PO TABS
5.0000 mg | ORAL_TABLET | Freq: Every evening | ORAL | Status: DC
Start: 2017-01-31 — End: 2017-01-31

## 2017-01-31 MED ORDER — CALCIUM CARBONATE-VITAMIN D 500-200 MG-UNIT PO TABS
1.0000 | ORAL_TABLET | Freq: Every day | ORAL | Status: DC
Start: 2017-02-01 — End: 2017-02-05
  Administered 2017-02-01 – 2017-02-05 (×4): 1 via ORAL
  Filled 2017-01-31 (×4): qty 1

## 2017-01-31 MED ORDER — SERTRALINE HCL 25 MG PO TABS
25.0000 mg | ORAL_TABLET | Freq: Every day | ORAL | Status: DC
  Administered 2017-01-31 – 2017-02-05 (×5): 25 mg via ORAL
  Filled 2017-01-31 (×7): qty 1

## 2017-01-31 MED ORDER — ADULT MULTIVITAMIN W/MINERALS CH
1.0000 | ORAL_TABLET | Freq: Every day | ORAL | Status: DC
  Administered 2017-01-31 – 2017-02-05 (×5): 1 via ORAL
  Filled 2017-01-31 (×5): qty 1

## 2017-01-31 MED ORDER — MONTELUKAST SODIUM 10 MG PO TABS
10.0000 mg | ORAL_TABLET | Freq: Every day | ORAL | Status: DC
  Administered 2017-01-31 – 2017-02-04 (×5): 10 mg via ORAL
  Filled 2017-01-31 (×7): qty 1

## 2017-01-31 NOTE — ED Notes (Addendum)
Nurse currently drawing labs 

## 2017-01-31 NOTE — ED Triage Notes (Signed)
Brought by ems from [facility].  Reports going to bathroom this morning then having sudden onset of central chest pain.  Given Ntg SL at facility and asa 324mg  via ems.  Now pain free.

## 2017-01-31 NOTE — H&P (Signed)
Date: 01/31/2017               Patient Name:  [name] MRN: [id number]  DOB: 14-Jan-1932 Age / Sex: 83 y.o., female   PCP: No primary care provider on file.         Medical Service: Internal Medicine Teaching Service         Attending Physician: Dr. Evette Doffing, Mallie Mussel, *    First Contact: Dr. Rochele Pages Pager: [phone]  Second Contact: Dr. Jule Ser Pager: [phone]       After Hours (After 5p/  First Contact Pager: [phone]  weekends / holidays): Second Contact Pager: [phone]   Chief Complaint: Shortness of breath  History of Present Illness:  83 yo female with PMHx significant for indolent Multiple Myeloma, GERD, HTN, HLD, and CAD s/p anterior wall MI in  2007 with BMS (RAC, LAD) placement complicated by recurrent restenosis requiring CABG presenting with shortness of breath and right shoulder pain. The patient states that she woke up naturally around 4 AM and got out of bed to use the bathroom. When she returned to bed and lied down she began feeling short of breath. She also was experiencing right shoulder pain. The patient could not describe the quality of the pain, but stated it was similar to the pain she had with her previous heart attack in 2007. Per EDP note, it was pressure like in quality. Overall she felt very uncomfortable and fatigued. The pain and shortness of breath lasted 10-15 minutes until she took one sublingual nitroglycerin, which resolved all of her symptoms. She also received 324 mg of aspirin.  At the onset of her symptoms her blood pressure was measured, and was elevated at 578 systolic. She denies nausea, vomiting, or diaphoresis with the event. She denied reflux symptoms. She did state that she was experiencing arthritis pain during the night and it kept her from sleeping. She also was previously taking 0.5 mg of Xanax prior to bed time, which she did not take last night. She was no longer experiencing pain or discomfort, and was  complaining of fatigue due to lack of sleep.   In the ED, EKG showed normal sinus rhythm with no evidence of ST elevations or acute ischemia, POC troponin was 0.02, and chest xray showed no acute cardiopulmonary process.   In 2011, she was admitted for NSTEMI and cardiac catheterization was performed. The patient's native LAD, circumflex, and saphenous vein graft were patent. Stenosis was noted in areas of previous stents. No drug eluting stent was placed at that time, and her NSTEMI was medically managed. She has a history of stable/exertional angina. In January 2014, she had a left heart cardiac catheterization, which showed patent LAD and circumflex, native RCA occluded mid vessel, widely patent saphenous vein graft, with normal LV function.  In June 2016, the patient had a cardiac event monitor that was negative for arrhythmia. In 01/2015 has myocardial perfusion stress testing completed at [facility] which showed no reversible ischemia or infarction, normal left ventricular wall motion, left ventricular ejection fraction 75%, and low-risk stress test findings.      Meds:  Current Meds  Medication Sig  . acetaminophen (TYLENOL) 650 MG CR tablet Take 1,300 mg by mouth 2 (two) times daily.  Marland Kitchen amLODipine (NORVASC) 5 MG tablet Take 0.5 tablets (2.5 mg total) by mouth daily.  Marland Kitchen aspirin EC 81 MG tablet Take 1 tablet (81 mg total) by mouth daily.  . calcium-vitamin D (  OSCAL WITH D) 500-200 MG-UNIT tablet Take 1 tablet by mouth daily with breakfast.   . docusate sodium (COLACE) 100 MG capsule Take 100 mg by mouth daily.  Marland Kitchen donepezil (ARICEPT) 5 MG tablet Take 5 mg by mouth at bedtime.  . famotidine (PEPCID) 40 MG tablet Take 40 mg by mouth 2 (two) times daily.   . hydrALAZINE (APRESOLINE) 25 MG tablet Take 25 mg by mouth 2 (two) times daily.   . iron polysaccharides (NIFEREX) 150 MG capsule Take 150 mg by mouth daily.  . isosorbide mononitrate (IMDUR) 60 MG 24 hr tablet Take 60 mg by mouth daily.    Marland Kitchen levocetirizine (XYZAL) 5 MG tablet Take 1 tablet (5 mg total) by mouth every evening.  . Melatonin 5 MG TABS Take 5 mg by mouth at bedtime.  . memantine (NAMENDA) 10 MG tablet Take 10 mg by mouth 2 (two) times daily.   . mirabegron ER (MYRBETRIQ) 25 MG TB24 tablet Take 25 mg by mouth every morning.   . montelukast (SINGULAIR) 10 MG tablet Take 10 mg by mouth at bedtime.   . Multiple Vitamin (MULITIVITAMIN WITH MINERALS) TABS Take 1 tablet by mouth daily.  . nitroGLYCERIN (NITROSTAT) 0.4 MG SL tablet Place 0.4 mg under the tongue every 5 (five) minutes as needed for chest pain. Reported on 11/12/2015  . rosuvastatin (CRESTOR) 5 MG tablet Take 1 tablet (5 mg total) by mouth daily at 6 PM.  . sertraline (ZOLOFT) 25 MG tablet Take 25 mg by mouth daily.  . sertraline (ZOLOFT) 50 MG tablet Take 50 mg by mouth every evening.      Allergies: Allergies as of 01/31/2017 - Review Complete 01/31/2017  Allergen Reaction Noted  . Butazolidin [phenylbutazone] Swelling and Rash 08/04/2012  . Cephalosporins Other (See Comments) 07/21/2013  . Codeine Nausea Only 06/30/2011  . Levaquin [levofloxacin] Shortness Of Breath 08/04/2012  . Macrodantin Other (See Comments) 06/30/2011  . Morphine and related Other (See Comments) 06/30/2011  . Trimethoprim Nausea Only 10/16/2013  . Cilostazol Other (See Comments) 07/05/2013  . Clindamycin Other (See Comments) 12/28/2015  . Dexilant [dexlansoprazole] Diarrhea 07/05/2013  . Erythromycin Nausea And Vomiting 07/08/2012  . Morphine  11/14/2013  . Nitrofurantoin  07/03/2015  . Simvastatin Other (See Comments) 06/30/2011  . Sulfa antibiotics Other (See Comments) 07/05/2013  . Vesicare [solifenacin] Other (See Comments) 07/05/2013  . Clindamycin/lincomycin Other (See Comments) 10/28/2015  . Food Other (See Comments) 07/08/2012  . Ibuprofen Hives and Rash 06/22/2008  . Lincomycin hcl Other (See Comments) 11/12/2015  . Penicillins Rash 06/22/2008  . Toviaz  [fesoterodine fumarate er] Other (See Comments) 07/05/2013  . Voltaren [diclofenac sodium] Rash 07/05/2013   Past Medical History:  Diagnosis Date  . Allergic rhinitis   . Anxiety   . Arthritis   . Asthma   . Coronary artery disease   . Coronary atherosclerosis of autologous vein bypass graft   . Coronary atherosclerosis of unspecified type of vessel, native or graft   . GERD (gastroesophageal reflux disease)   . Hx of CABG   . Hypercholesteremia   . Hyperlipidemia    GOAL LDL <70  . Hypertension   . IBS (irritable bowel syndrome)   . Incontinent of urine   . Intermediate coronary syndrome ([place])   . Mild chronic anemia   . Multiple myeloma (HCC)    Dr. Benay Spice  . Osteopenia   . Ovarian cyst, bilateral    Rt complex  . Overactive bladder   . Panic disorder   .  Raynauds syndrome   . Spinal stenosis of lumbar region   . Urine test positive for microalbuminuria 12/2011    Family History:  Problem Relation Age of Onset  . Heart attack Mother   . CAD Mother   . Obesity Sister   . Cancer Paternal Aunt        breast    Social History:   Social History Main Topics  . Smoking status: Never Smoker  . Smokeless tobacco: Never Used  . Alcohol use Yes     Comment: occasional  . Drug use: Denies    Review of Systems: A complete ROS was negative except as per HPI.   Physical Exam: Blood pressure (!) 186/67, pulse 68, temperature 98.8 F (37.1 C), temperature source Oral, resp. rate 15, height _0  (1.6 m), weight 97 lb 6.4 oz (44.2 kg), SpO2 100 %.  General: Laying in bed comfortably, NAD HEENT: [place]/AT, EOMI, no scleral icterus, PERRL Cardiac: RRR, No R/M/G appreciated, mild TTP of chest bilaterally Pulm: normal effort, CTAB Abd: soft, non tender, non distended, BS normal Ext: extremities well perfused, trace peripheral edema in ankles bilaterally Neuro: alert and oriented X3, cranial nerves II-XII grossly intact   EKG: personally reviewed my interpretation is  normal sinus rhythm, T wave inversion in aVL more pronouncde than prior EKGs.  CXR: personally reviewed my interpretation is no pneumothorax, pulmonary edema, pleural effusion or acute pulmonary opacity.  Assessment & Plan by Problem: Active Problems:   Chest pain Typical chest pain with associated shoulder pain and shortness of breath. Symptoms relieved by nitroglycerin. Pt admitted for ACS rule out and has significant cardiac risk factors including CAD, previous MI s/p CABG 2007, NSTEMI 2011, and HTN. Heart score of 5. EKG no evidence of ST elevations, but T wave inversions in aVL more pronounced than in previous EKG, poc troponin 0.02. Most recent myoview stress test and echocardiogram were in august 2016, which were both unremarkable.  -Cardiac monitoring -Trend troponin -Repeat EKG in AM -Continue Aspirin 81 mg, Crestor 5 mg -Appreciate cardiology recommendations   HTN BP 153/65 in ED. Patient's blood pressure gets checked daily and she states in the mornings it is typically 937-169 systolic. -Continue Norvasc 2.5 mg -Continue Hydralazine 25 mg twce daily  Hypokalemia BMET in ED with serum potassium of 3.3.  -Replete with 40 meq of Kdur -BMET in AM  Dispo: Admit patient to Observation with expected length of stay less than 2 midnights.  Signed: Melanee Spry, MD 01/31/2017, 10:38 AM  Pager: [phone]

## 2017-01-31 NOTE — Progress Notes (Signed)
ANTICOAGULATION CONSULT NOTE - Initial Consult  Pharmacy Consult for Heparin Indication: chest pain/ACS  Allergies  Allergen Reactions  . Butazolidin [Phenylbutazone] Swelling and Rash  . Cephalosporins Other (See Comments)    unknown  . Codeine Nausea Only  . Levaquin [Levofloxacin] Shortness Of Breath  . Macrodantin Other (See Comments)    Double vision  . Morphine And Related Other (See Comments)    hallucinations  . Trimethoprim Nausea Only  . Cilostazol Other (See Comments)    Stomach upset  . Clindamycin Other (See Comments)    diarrhea  . Dexilant [Dexlansoprazole] Diarrhea  . Erythromycin Nausea And Vomiting    All "mycin" drugs  . Morphine   . Nitrofurantoin     Other reaction(s): Other (See Comments) Double vision Other reaction(s): Other (See Comments) Double vision  . Simvastatin Other (See Comments)    Muscle aches Muscle aches  . Sulfa Antibiotics Other (See Comments)    unknown  . Vesicare [Solifenacin] Other (See Comments)    unknown  . Clindamycin/Lincomycin Other (See Comments)    Upset stomach  . Food Other (See Comments)    No spicy foods or black pepper, raw onions - causes gerd  . Ibuprofen Hives and Rash  . Lincomycin Hcl Other (See Comments)    Upset stomach  . Penicillins Rash    Has patient had a PCN reaction causing immediate rash, facial/tongue/throat swelling, SOB or lightheadedness with hypotension: Yes Has patient had a PCN reaction causing severe rash involving mucus membranes or skin necrosis: No Has patient had a PCN reaction that required hospitalization No Has patient had a PCN reaction occurring within the last 10 years: No If all of the above answers are "NO", then may proceed with Cephalosporin use.   Lisbeth Ply [Fesoterodine Fumarate Er] Other (See Comments)    unknown  . Voltaren [Diclofenac Sodium] Rash    Patient Measurements: Height: '5\' 3"'  (160 cm) Weight: 97 lb 6.4 oz (44.2 kg) IBW/kg (Calculated) : 52.4 Heparin  Dosing Weight: 44.2 kg  Vital Signs: Temp: 98.1 F (36.7 C) (08/25 1024) Temp Source: Oral (08/25 1024) BP: 120/71 (08/25 1225) Pulse Rate: 61 (08/25 1024)  Labs:  Recent Labs  01/31/17 0623 01/31/17 1050  HGB 10.3*  --   HCT 30.3*  --   PLT 248  --   CREATININE 0.79  --   TROPONINI  --  0.06*    Estimated Creatinine Clearance: 37.2 mL/min (by C-G formula based on SCr of 0.79 mg/dL).   Medical History: Past Medical History:  Diagnosis Date  . Allergic rhinitis   . Anxiety   . Arthritis   . Asthma   . Coronary artery disease   . Coronary atherosclerosis of autologous vein bypass graft   . Coronary atherosclerosis of unspecified type of vessel, native or graft   . GERD (gastroesophageal reflux disease)   . Hx of CABG   . Hypercholesteremia   . Hyperlipidemia    GOAL LDL <70  . Hypertension   . IBS (irritable bowel syndrome)   . Incontinent of urine   . Intermediate coronary syndrome ([place])   . Mild chronic anemia   . Multiple myeloma (HCC)    Dr. Benay Spice  . Osteopenia   . Ovarian cyst, bilateral    Rt complex  . Overactive bladder   . Panic disorder   . Raynauds syndrome   . Spinal stenosis of lumbar region   . Urine test positive for microalbuminuria 12/2011    Medications:  Scheduled:  . amLODipine  2.5 mg Oral Daily  . aspirin EC  81 mg Oral Daily  . [START ON 02/01/2017] calcium-vitamin D  1 tablet Oral Q breakfast  . docusate sodium  100 mg Oral Daily  . donepezil  5 mg Oral QHS  . enoxaparin (LOVENOX) injection  40 mg Subcutaneous Daily  . famotidine  40 mg Oral BID  . hydrALAZINE  25 mg Oral BID  . iron polysaccharides  150 mg Oral Daily  . isosorbide mononitrate  60 mg Oral Daily  . loratadine  10 mg Oral q1800  . memantine  10 mg Oral BID  . mirabegron ER  25 mg Oral Daily  . montelukast  10 mg Oral QHS  . multivitamin with minerals  1 tablet Oral Daily  . rosuvastatin  5 mg Oral q1800  . sertraline  25 mg Oral Daily  . sertraline  50  mg Oral QPM    Assessment: 83yo female with history of CAD with stent placement and prior CABG presents with SOB and right shoulder pain. Given one dose of enoxaparin 40 mg  today. Pharmacy consulted to dose heparin.   Patient not on AC at home, CBC stable, no bleeding reported.   Goal of Therapy:  Heparin level 0.3-0.7 units/ml Monitor platelets by anticoagulation protocol: Yes   Plan:  No bolus since received enoxaparin today Heparin gtt 500 units/hr Obtain 8-hour heparin level Daily CBC and HL Monitor for s/s of bleeding  Leroy Libman, PharmD Pharmacy Resident Pager: [phone]  01/31/2017,2:13 PM

## 2017-01-31 NOTE — Progress Notes (Signed)
CRITICAL VALUE ALERT  Critical Value:  Troponin=0.06  Date & Time Notied:  01/31/2017 at 1252  Provider Notified: Melanee Spry, MD  Orders Received/Actions taken: NO NEW ORDERS RECIEVED

## 2017-01-31 NOTE — ED Provider Notes (Signed)
[facility] DEPT Provider Note   CSN: [id number] Arrival date & time: 01/31/17  6387     History   Chief Complaint Chief Complaint  Patient presents with  . Chest Pain    HPI   Blood pressure (!) 186/77, pulse 64, temperature 98.8 F (37.1 C), temperature source Oral, resp. rate 12, height _0  (1.6 m), weight 46.3 kg (102 lb), SpO2 98 %.  [name] is a 83 y.o. female brought in by EMS from assisted living facility for right sided shoulder and arm pain onset 4 AM she noticed when she got up to use the restroom was associated with shortness of breath. She states it feels like when she had her heart attack in the past. There was no associated diaphoresis, palpitations, syncope, anterior chest pain. She's had pains like this in the past but never this severe, she says it was pressure-like, 8 out of 10 at onset. She spoke to the nurse practitioner at the assisted living facility who came to evaluate her and gave her a sublingual nitroglycerin which essentially resolved the pain. 911 was called and she was given full dose aspirin in route. Her pain has been resolved she rated 0 out of 10 right now. She's never had a DVT/PE. She does have active low-grade multiple myeloma. There is been no recent immobilizations or surgery. She has chronic lower extremity edema worse on the left than the right which is typical for her baseline. No prodrome of cough, fever. She does have a history of GERD but states that this does not feel similar.  Cardiology : Hochrein   Past Medical History:  Diagnosis Date  . Allergic rhinitis   . Anxiety   . Arthritis   . Asthma   . Coronary artery disease   . Coronary atherosclerosis of autologous vein bypass graft   . Coronary atherosclerosis of unspecified type of vessel, native or graft   . GERD (gastroesophageal reflux disease)   . Hx of CABG   . Hypercholesteremia   . Hyperlipidemia    GOAL LDL <70  . Hypertension   . IBS (irritable bowel  syndrome)   . Incontinent of urine   . Intermediate coronary syndrome ([place])   . Mild chronic anemia   . Multiple myeloma (H[facility])    Dr. Benay Spice  . Osteopenia   . Ovarian cyst, bilateral    Rt complex  . Overactive bladder   . Panic disorder   . Raynauds syndrome   . Spinal stenosis of lumbar region   . Urine test positive for microalbuminuria 12/2011    Patient Active Problem List   Diagnosis Date Noted  . SOB (shortness of breath) 09/02/2016  . Dizziness 09/02/2016  . Chest pain 11/26/2015  . Polymyalgia rheumatica ([place]) 11/26/2015  . Hypokalemia 11/26/2015  . Mild persistent asthma 11/12/2015  . Coronary artery disease involving autologous artery coronary bypass graft with angina pectoris with documented spasm ([place]) 11/12/2015  . Multiple myeloma in remission ([place]) 11/12/2015  . Current use of beta blocker 11/12/2015  . Gastroesophageal reflux disease without esophagitis 11/12/2015  . Other allergic rhinitis 11/12/2015  . Weight loss, non-intentional 10/03/2015  . Asthma 09/24/2015  . Asthma in adult without complication 56/43/3295  . Seasonal allergic rhinitis due to pollen 09/24/2015  . Cervical radiculopathy 07/25/2015  . Chronic coronary artery disease 07/25/2015  . Coronary artery disease 07/25/2015  . Disturbance in sleep behavior 07/25/2015  . Elevated CK 07/25/2015  . Elevated creatine kinase level 07/25/2015  .  Generalized osteoarthritis of hand 07/25/2015  . Other long term (current) drug therapy 07/25/2015  . Overactive bladder 07/25/2015  . Restless leg 07/25/2015  . Restless leg syndrome 07/25/2015  . Sleep disturbances 07/25/2015  . Idiopathic peripheral neuropathy 07/04/2015  . Lumbar radiculopathy, chronic 07/04/2015  . 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 Risk for falls 07/04/2015  . Sleep disturbance 07/04/2015  . Chest pain with moderate risk for cardiac etiology 06/13/2015  . Right sciatic nerve pain 10/19/2014  . Abnormal thyroid blood test 08/25/2014    . Chronic fatigue 08/09/2014  . Multiple myeloma ([place]) 07/21/2013  . Depression 01/14/2013  . Generalized anxiety disorder 01/14/2013  . Gastroesophageal reflux disease 01/13/2013  . Cyst of ovary 12/17/2010  . KNEE PAIN, BILATERAL 08/15/2010  . Abnormal gait 08/15/2010  . Atherosclerosis of coronary artery bypass graft 10/11/2009  . CHEST PAIN, NON-CARDIAC 10/11/2009  . HIP PAIN, RIGHT 08/13/2009  . GREATER TROCHANTERIC BURSITIS 08/13/2009  . LUMBOSACRAL SPONDYLOSIS WITHOUT MYELOPATHY 11/10/2008  . Osteoarthritis of lumbosacral spine without myelopathy 11/10/2008  . FOOT PAIN, BILATERAL 06/30/2008  . Hyperlipidemia 06/22/2008  . Hypertension 06/22/2008  . Raynaud's disease 06/22/2008  . BURSITIS, LEFT SHOULDER 06/22/2008    Past Surgical History:  Procedure Laterality Date  . BUNIONECTOMY WITH HAMMERTOE RECONSTRUCTION Right 08/04/2012   Procedure:  HAMMERTOE RECONSTRUCTION;  Surgeon: Colin Rhein, MD;  Location: [facility];  Service: Orthopedics;  Laterality: Right;  HAMMER TOE RECONSTRUCTION PROBABLE FLEXOR DIGITORUM LONGUS TO PROXIMAL PHALANX TRANSFER LATERALIZED   . CAPSULOTOMY Right 08/04/2012   Procedure: CAPSULOTOMY;  Surgeon: Colin Rhein, MD;  Location: [facility];  Service: Orthopedics;  Laterality: Right;  RIGHT 2ND TOE METATARSOPHALANGEAL JOINT DORSAL CAPSULOTOMY   . CATARACT EXTRACTION, BILATERAL    . COLONOSCOPY    . CORONARY ANGIOPLASTY WITH STENT PLACEMENT  2014   2 stents  . CORONARY ARTERY BYPASS GRAFT  2007   LIMA to LAD, SVG to RCA  non-ST segment MI 08/2009 (Dr. Tamala Julian)  . DILATION AND CURETTAGE OF UTERUS    . EYE SURGERY     both cataracts  . HAMMER TOE SURGERY Right 08/2012  . LEFT HEART CATHETERIZATION WITH CORONARY/GRAFT ANGIOGRAM N/A 07/09/2012   Procedure: LEFT HEART CATHETERIZATION WITH Beatrix Fetters;  Surgeon: Sinclair Grooms, MD;  Location[facility] CATH LAB;  Service: Cardiovascular;  Laterality: N/A;  .  TONSILLECTOMY AND ADENOIDECTOMY    . UPPER GASTROINTESTINAL ENDOSCOPY      OB History    No data available       Home Medications    Prior to Admission medications   Medication Sig Start Date End Date Taking? Authorizing Provider  acetaminophen (TYLENOL) 650 MG CR tablet Take 1,300 mg by mouth 2 (two) times daily.   Yes [provider]  amLODipine (NORVASC) 5 MG tablet Take 0.5 tablets (2.5 mg total) by mouth daily. 07/18/16  Yes Kinnie Feil, PA-C  aspirin EC 81 MG tablet Take 1 tablet (81 mg total) by mouth daily. 01/21/16  Yes Minus Breeding, MD  calcium-vitamin D (OSCAL WITH D) 500-200 MG-UNIT tablet Take 1 tablet by mouth daily with breakfast.    Yes [provider]  docusate sodium (COLACE) 100 MG capsule Take 100 mg by mouth daily.   Yes [provider]  donepezil (ARICEPT) 5 MG tablet Take 5 mg by mouth at bedtime.   Yes [provider]  famotidine (PEPCID) 40 MG tablet Take 40 mg by mouth 2 (two) times daily.  Yes [provider]  hydrALAZINE (APRESOLINE) 25 MG tablet Take 25 mg by mouth 2 (two) times daily.    Yes [provider]  iron polysaccharides (NIFEREX) 150 MG capsule Take 150 mg by mouth daily.   Yes [provider]  isosorbide mononitrate (IMDUR) 60 MG 24 hr tablet Take 60 mg by mouth daily.   Yes [provider]  levocetirizine (XYZAL) 5 MG tablet Take 1 tablet (5 mg total) by mouth every evening. 09/30/16  Yes Bardelas, Jose A, MD  Melatonin 5 MG TABS Take 5 mg by mouth at bedtime.   Yes [provider]  memantine (NAMENDA) 10 MG tablet Take 10 mg by mouth 2 (two) times daily.    Yes [provider]  mirabegron ER (MYRBETRIQ) 25 MG TB24 tablet Take 25 mg by mouth every morning.    Yes [provider]  montelukast (SINGULAIR) 10 MG tablet Take 10 mg by mouth at bedtime.    Yes [provider]  Multiple Vitamin (MULITIVITAMIN WITH MINERALS) TABS Take 1  tablet by mouth daily.   Yes [provider]  nitroGLYCERIN (NITROSTAT) 0.4 MG SL tablet Place 0.4 mg under the tongue every 5 (five) minutes as needed for chest pain. Reported on 11/12/2015   Yes [provider]  rosuvastatin (CRESTOR) 5 MG tablet Take 1 tablet (5 mg total) by mouth daily at 6 PM. 06/10/16  Yes Hochrein, Jeneen Rinks, MD  sertraline (ZOLOFT) 25 MG tablet Take 25 mg by mouth daily.   Yes [provider]  sertraline (ZOLOFT) 50 MG tablet Take 50 mg by mouth every evening.    Yes [provider]  acetaminophen (TYLENOL) 500 MG tablet Take 2 tablets (1,000 mg total) by mouth every 6 (six) hours as needed. Patient not taking: Reported on 01/31/2017 08/03/16   Charlesetta Shanks, MD  budesonide (RHINOCORT AQUA) 32 MCG/ACT nasal spray USE ONE-TWO SPRAYS EACH NOSTRILS TWICE A DAY AS NEEDED. Patient not taking: Reported on 01/31/2017 05/06/16   Charlies Silvers, MD  hydrALAZINE (APRESOLINE) 10 MG tablet Take 1 tablet (10 mg total) by mouth as needed. Take if Systolic blood pressure over 180 Patient not taking: Reported on 01/31/2017 11/26/16   Minus Breeding, MD  traMADol (ULTRAM) 50 MG tablet Take 1 tablet (50 mg total) by mouth every 6 (six) hours as needed. 1-2 tablets every 6 hours with tylenol Patient not taking: Reported on 01/31/2017 08/03/16   Charlesetta Shanks, MD    Family History Family History  Problem Relation Age of Onset  . Heart attack Mother   . CAD Mother   . Obesity Sister   . Cancer Paternal Aunt        breast  . Asthma Neg Hx   . Eczema Neg Hx   . Urticaria Neg Hx   . Allergic rhinitis Neg Hx   . Angioedema Neg Hx   . Atopy Neg Hx   . Immunodeficiency Neg Hx     Social History Social History  Substance Use Topics  . Smoking status: Never Smoker  . Smokeless tobacco: Never Used  . Alcohol use Yes     Comment: occasional     Allergies   Butazolidin [phenylbutazone]; Cephalosporins; Codeine; Levaquin [levofloxacin]; Macrodantin;  Morphine and related; Trimethoprim; Cilostazol; Clindamycin; Dexilant [dexlansoprazole]; Erythromycin; Morphine; Nitrofurantoin; Simvastatin; Sulfa antibiotics; Vesicare [solifenacin]; Clindamycin/lincomycin; Food; Ibuprofen; Lincomycin hcl; Penicillins; Toviaz [fesoterodine fumarate er]; and Voltaren [diclofenac sodium]   Review of Systems Review of Systems  A complete review of systems was  obtained and all systems are negative except as noted in the HPI and PMH.    Physical Exam Updated Vital Signs BP (!) 150/60 (BP Location: Right Arm)   Pulse (!) 58   Temp 98.8 F (37.1 C) (Oral)   Resp 13   Ht _0  (1.6 m)   Wt 46.3 kg (102 lb)   SpO2 99%   BMI 18.07 kg/m   Physical Exam  Constitutional: She is oriented to person, place, and time. She appears well-developed and well-nourished. No distress.  HENT:  Head: Normocephalic.  Mouth/Throat: Oropharynx is clear and moist.  Eyes: Conjunctivae are normal.  Neck: Normal range of motion. No JVD present. No tracheal deviation present.  Cardiovascular: Normal rate, regular rhythm and intact distal pulses.   Radial pulse equal bilaterally  Pulmonary/Chest: Effort normal and breath sounds normal. No stridor. No respiratory distress. She has no wheezes. She has no rales. She exhibits no tenderness.  Abdominal: Soft. She exhibits no distension and no mass. There is no tenderness. There is no rebound and no guarding.  Musculoskeletal: Normal range of motion. She exhibits no edema or tenderness.  No calf asymmetry, superficial collaterals, palpable cords, edema, Homans sign negative bilaterally.    Neurological: She is alert and oriented to person, place, and time.  Skin: Skin is warm. She is not diaphoretic.  Psychiatric: She has a normal mood and affect.  Nursing note and vitals reviewed.    ED Treatments / Results  Labs (all labs ordered are listed, but only abnormal results are displayed) Labs Reviewed  BASIC METABOLIC PANEL -  Abnormal; Notable for the following:       Result Value   Potassium 3.3 (*)    All other components within normal limits  CBC - Abnormal; Notable for the following:    WBC 3.1 (*)    RBC 3.13 (*)    Hemoglobin 10.3 (*)    HCT 30.3 (*)    All other components within normal limits  I-STAT TROPONIN, ED    EKG  EKG Interpretation  Date/Time:  Saturday January 31 2017 06:05:00 EDT Ventricular Rate:  66 PR Interval:    QRS Duration: 92 QT Interval:  409 QTC Calculation: 429 R Axis:   7 Text Interpretation:  Sinus rhythm LVH with secondary repolarization abnormality Confirmed by Pryor Curia [phone]) on 01/31/2017 6:48:37 AM       Radiology Dg Chest 2 View  Result Date: 01/31/2017 CLINICAL DATA:  83 year old female with sudden onset chest pain this morning. EXAM: CHEST  2 VIEW COMPARISON:  Chest CTA 08/03/2016 and earlier. FINDINGS: Upright AP and lateral views of the chest. Prior CABG. Stable mild cardiomegaly. Other mediastinal contours are within normal limits. Visualized tracheal air column is within normal limits. Stable large lung volumes. No pneumothorax, pulmonary edema, pleural effusion or acute pulmonary opacity. Osteopenia. Stable visualized osseous structures. IMPRESSION: No acute cardiopulmonary abnormality. Electronically Signed   By: Genevie Ann M.D.   On: 01/31/2017 06:55    Procedures Procedures (including critical care time)  Medications Ordered in ED Medications - No data to display   Initial Impression / Assessment and Plan / ED Course  I have reviewed the triage vital signs and the nursing notes.  Pertinent labs & imaging results that were available during my care of the patient were reviewed by me and considered in my medical decision making (see chart for details).     Vitals:   01/31/17 0610 01/31/17 0615 01/31/17 0630 01/31/17 0715  BP:  Marland Kitchen)  190/68 (!) 189/69 (!) 150/60  Pulse:  64 65 (!) 58  Resp:  (!) _0 Temp:      TempSrc:      SpO2:  98%  100% 99%  Weight: 46.3 kg (102 lb)     Height: _1  (1.6 m)        [name] is 83 y.o. female presenting with right sided shoulder and arm pain associated with SOB similar to prior heart attack. EKG, bloodwork, chest x-ray and troponin reassuring. Given her cardiac history will need admission for chest pain rule out.  Unassigned admission to teaching service d/w Mitzi Hansen who accepts admission.    Final Clinical Impressions(s) / ED Diagnoses   Final diagnoses:  Chest pain, unspecified type    New Prescriptions New Prescriptions   No medications on file     Waynetta Pean 01/31/17 1102

## 2017-01-31 NOTE — ED Notes (Addendum)
Taken to xray at this time. 

## 2017-01-31 NOTE — ED Notes (Signed)
Attempted report 

## 2017-01-31 NOTE — ED Provider Notes (Signed)
Medical screening examination/treatment/procedure(s) were conducted as a shared visit with non-physician practitioner(s) and myself.  I personally evaluated the patient during the encounter.   EKG Interpretation  Date/Time:  Saturday January 31 2017 06:05:00 EDT Ventricular Rate:  66 PR Interval:    QRS Duration: 92 QT Interval:  409 QTC Calculation: 429 R Axis:   7 Text Interpretation:  Sinus rhythm LVH with secondary repolarization abnormality Confirmed by Pryor Curia [phone]) on 01/31/2017 6:48:37 AM      Pt is a 83 y.o. female with history of coronary artery disease who presents the emergency department with chest discomforts mostly right-sided, shortness of breath, fatigue, dizziness that feels similar her previous anginal equivalent. Workup in the emergency department is unremarkable including no significant changes in her EKG, negative troponin and clear chest x-ray. Patient reports pain free after one nitroglycerin tablet. We'll discuss with medicine for admission for ACS rule out.   Selma Mink, Delice Bison, DO 01/31/17 0730

## 2017-01-31 NOTE — Consult Note (Signed)
Cardiology Consultation:   Patient ID: [name]; [id number]; 1932-01-16   Admit date: 01/31/2017 Date of Consult: 01/31/2017  Primary Care Provider: No primary care provider on file. Primary Cardiologist: Dr. Daneen Schick Primary Electrophysiologist:  none   Patient Profile:   [name] is a 83 y.o. female with a hx of GERD, ASCAD s/p remote AWMI in 2007 with BMS to RCA and LAD and subsequent CABG for restenosis who is being seen today for the evaluation of SOB and shoulder pain at the request of Dr. Evette Doffing.  History of Present Illness:   [name] is an 83yo WF with a history of indolent multiple myeloma, HTN, GERD, hyperlipidemia, ASCAD s/p AWMI in 2007 with BMS to LAD and RCA and then CABG for restenosis. Her last Cath 2014 for ACS showed widely patent LAD and LCx, 90% mid RCA prox to prior stent, patent SVG to RCA, patent stent in D1 with 30% ISR and a 70% ostial narrowing and atretic LIMA to the LAD ad normal LVF.  She has been on medical management.  She has chronic stable angina.  She had recurrent CP in 06/2015 and medical management was continued.  Apparently she has a variety of complaints in the past that have felt to be MSK in etiology, problems with sleep, anxiety.  She has seen multiple MDs at various hospitals.    She presented today when she got up at 4am to go to the bathroom. She returned to bed and layed down she began feeling short of breath. She also was experiencing right shoulder pain. The patient could not describe the quality of the pain, but stated it was similar to the pain she had with her previous heart attack in 2007. Per EDP note, it was pressure like in quality. Overall she felt very uncomfortable and fatigued. The pain and shortness of breath lasted 10-15 minutes until she took one sublingual nitroglycerin, which resolved all of her symptoms. She also received 324 mg of aspirin.  At the onset of her symptoms her blood pressure was measured, and was  elevated at 269 systolic. She denies nausea, vomiting, or diaphoresis with the event. She denied reflux symptoms. EKG showed normal sinus rhythm with nonspecific ST depression, POC troponin was 0.02, and chest xray showed no acute cardiopulmonary process. Her second trop has come back at 0.06 and Cardiology is now asked to consult.      Past Medical History:  Diagnosis Date  . Allergic rhinitis   . Anxiety   . Arthritis   . Asthma   . Coronary artery disease   . Coronary atherosclerosis of autologous vein bypass graft   . Coronary atherosclerosis of unspecified type of vessel, native or graft   . GERD (gastroesophageal reflux disease)   . Hx of CABG   . Hypercholesteremia   . Hyperlipidemia    GOAL LDL <70  . Hypertension   . IBS (irritable bowel syndrome)   . Incontinent of urine   . Intermediate coronary syndrome ([place])   . Mild chronic anemia   . Multiple myeloma ([facility])    Dr. Benay Spice  . Osteopenia   . Ovarian cyst, bilateral    Rt complex  . Overactive bladder   . Panic disorder   . Raynauds syndrome   . Spinal stenosis of lumbar region   . Urine test positive for microalbuminuria 12/2011    Past Surgical History:  Procedure Laterality Date  . BUNIONECTOMY WITH HAMMERTOE RECONSTRUCTION Right 08/04/2012   Procedure:  HAMMERTOE RECONSTRUCTION;  Surgeon: Colin Rhein, MD;  Location: [place];  Service: Orthopedics;  Laterality: Right;  HAMMER TOE RECONSTRUCTION PROBABLE FLEXOR DIGITORUM LONGUS TO PROXIMAL PHALANX TRANSFER LATERALIZED   . CAPSULOTOMY Right 08/04/2012   Procedure: CAPSULOTOMY;  Surgeon: Colin Rhein, MD;  Location: [facility];  Service: Orthopedics;  Laterality: Right;  RIGHT 2ND TOE METATARSOPHALANGEAL JOINT DORSAL CAPSULOTOMY   . CATARACT EXTRACTION, BILATERAL    . COLONOSCOPY    . CORONARY ANGIOPLASTY WITH STENT PLACEMENT  2014   2 stents  . CORONARY ARTERY BYPASS GRAFT  2007   LIMA to LAD, SVG to RCA  non-ST segment MI  08/2009 (Dr. Tamala Julian)  . DILATION AND CURETTAGE OF UTERUS    . EYE SURGERY     both cataracts  . HAMMER TOE SURGERY Right 08/2012  . LEFT HEART CATHETERIZATION WITH CORONARY/GRAFT ANGIOGRAM N/A 07/09/2012   Procedure: LEFT HEART CATHETERIZATION WITH Beatrix Fetters;  Surgeon: Sinclair Grooms, MD;  Location: [facility] CATH LAB;  Service: Cardiovascular;  Laterality: N/A;  . TONSILLECTOMY AND ADENOIDECTOMY    . UPPER GASTROINTESTINAL ENDOSCOPY       Home Medications:  Prior to Admission medications   Medication Sig Start Date End Date Taking? Authorizing Provider  acetaminophen (TYLENOL) 650 MG CR tablet Take 1,300 mg by mouth 2 (two) times daily.   Yes [provider]  amLODipine (NORVASC) 5 MG tablet Take 0.5 tablets (2.5 mg total) by mouth daily. 07/18/16  Yes Kinnie Feil, PA-C  aspirin EC 81 MG tablet Take 1 tablet (81 mg total) by mouth daily. 01/21/16  Yes Minus Breeding, MD  calcium-vitamin D (OSCAL WITH D) 500-200 MG-UNIT tablet Take 1 tablet by mouth daily with breakfast.    Yes [provider]  docusate sodium (COLACE) 100 MG capsule Take 100 mg by mouth daily.   Yes [provider]  donepezil (ARICEPT) 5 MG tablet Take 5 mg by mouth at bedtime.   Yes [provider]  famotidine (PEPCID) 40 MG tablet Take 40 mg by mouth 2 (two) times daily.    Yes [provider]  hydrALAZINE (APRESOLINE) 25 MG tablet Take 25 mg by mouth 2 (two) times daily.    Yes [provider]  iron polysaccharides (NIFEREX) 150 MG capsule Take 150 mg by mouth daily.   Yes [provider]  isosorbide mononitrate (IMDUR) 60 MG 24 hr tablet Take 60 mg by mouth daily.   Yes [provider]  levocetirizine (XYZAL) 5 MG tablet Take 1 tablet (5 mg total) by mouth every evening. 09/30/16  Yes Bardelas, Jose A, MD  Melatonin 5 MG TABS Take 5 mg by mouth at bedtime.   Yes [provider]  memantine (NAMENDA) 10 MG tablet Take 10 mg by  mouth 2 (two) times daily.    Yes [provider]  mirabegron ER (MYRBETRIQ) 25 MG TB24 tablet Take 25 mg by mouth every morning.    Yes [provider]  montelukast (SINGULAIR) 10 MG tablet Take 10 mg by mouth at bedtime.    Yes [provider]  Multiple Vitamin (MULITIVITAMIN WITH MINERALS) TABS Take 1 tablet by mouth daily.   Yes [provider]  nitroGLYCERIN (NITROSTAT) 0.4 MG SL tablet Place 0.4 mg under the tongue every 5 (five) minutes as needed for chest pain. Reported on 11/12/2015   Yes [provider]  rosuvastatin (CRESTOR) 5 MG tablet Take 1 tablet (5 mg total) by mouth daily  at 6 PM. 06/10/16  Yes Minus Breeding, MD  sertraline (ZOLOFT) 25 MG tablet Take 25 mg by mouth daily.   Yes [provider]  sertraline (ZOLOFT) 50 MG tablet Take 50 mg by mouth every evening.    Yes [provider]  hydrALAZINE (APRESOLINE) 10 MG tablet Take 1 tablet (10 mg total) by mouth as needed. Take if Systolic blood pressure over 180 Patient not taking: Reported on 01/31/2017 11/26/16   Minus Breeding, MD    Inpatient Medications: Scheduled Meds: . amLODipine  2.5 mg Oral Daily  . aspirin EC  81 mg Oral Daily  . [START ON 02/01/2017] calcium-vitamin D  1 tablet Oral Q breakfast  . docusate sodium  100 mg Oral Daily  . donepezil  5 mg Oral QHS  . famotidine  40 mg Oral BID  . hydrALAZINE  25 mg Oral BID  . iron polysaccharides  150 mg Oral Daily  . isosorbide mononitrate  60 mg Oral Daily  . loratadine  10 mg Oral q1800  . memantine  10 mg Oral BID  . mirabegron ER  25 mg Oral Daily  . montelukast  10 mg Oral QHS  . multivitamin with minerals  1 tablet Oral Daily  . rosuvastatin  5 mg Oral q1800  . sertraline  25 mg Oral Daily  . sertraline  50 mg Oral QPM   Continuous Infusions: . heparin     PRN Meds: acetaminophen, gi cocktail, ondansetron (ZOFRAN) IV  Allergies:    Allergies  Allergen Reactions  . Butazolidin  [Phenylbutazone] Swelling and Rash  . Cephalosporins Other (See Comments)    unknown  . Codeine Nausea Only  . Levaquin [Levofloxacin] Shortness Of Breath  . Macrodantin Other (See Comments)    Double vision  . Morphine And Related Other (See Comments)    hallucinations  . Trimethoprim Nausea Only  . Cilostazol Other (See Comments)    Stomach upset  . Clindamycin Other (See Comments)    diarrhea  . Dexilant [Dexlansoprazole] Diarrhea  . Erythromycin Nausea And Vomiting    All "mycin" drugs  . Morphine   . Nitrofurantoin     Other reaction(s): Other (See Comments) Double vision Other reaction(s): Other (See Comments) Double vision  . Simvastatin Other (See Comments)    Muscle aches Muscle aches  . Sulfa Antibiotics Other (See Comments)    unknown  . Vesicare [Solifenacin] Other (See Comments)    unknown  . Clindamycin/Lincomycin Other (See Comments)    Upset stomach  . Food Other (See Comments)    No spicy foods or black pepper, raw onions - causes gerd  . Ibuprofen Hives and Rash  . Lincomycin Hcl Other (See Comments)    Upset stomach  . Penicillins Rash    Has patient had a PCN reaction causing immediate rash, facial/tongue/throat swelling, SOB or lightheadedness with hypotension: Yes Has patient had a PCN reaction causing severe rash involving mucus membranes or skin necrosis: No Has patient had a PCN reaction that required hospitalization No Has patient had a PCN reaction occurring within the last 10 years: No If all of the above answers are "NO", then may proceed with Cephalosporin use.   Lisbeth Ply [Fesoterodine Fumarate Er] Other (See Comments)    unknown  . Voltaren [Diclofenac Sodium] Rash    Social History:   Social History   Social History  . Marital status: Widowed    Spouse name: N/A  . Number of children: N/A  . Years of education:  N/A   Occupational History  . Not on file.   Social History Main Topics  . Smoking status: Never Smoker  .  Smokeless tobacco: Never Used  . Alcohol use Yes     Comment: occasional  . Drug use: No  . Sexual activity: No   Other Topics Concern  . Not on file   Social History Narrative  . No narrative on file    Family History:    Family History  Problem Relation Age of Onset  . Heart attack Mother   . CAD Mother   . Obesity Sister   . Cancer Paternal Aunt        breast  . Asthma Neg Hx   . Eczema Neg Hx   . Urticaria Neg Hx   . Allergic rhinitis Neg Hx   . Angioedema Neg Hx   . Atopy Neg Hx   . Immunodeficiency Neg Hx      ROS:  Please see the history of present illness.  ROS  All other ROS reviewed and negative.     Physical Exam/Data:   Vitals:   01/31/17 0846 01/31/17 1020 01/31/17 1024 01/31/17 1225  BP: (!) 186/67  (!) 179/54 120/71  Pulse: 68  61   Resp: 15  15   Temp:   98.1 F (36.7 C)   TempSrc:   Oral   SpO2: 100%  100%   Weight:  97 lb 6.4 oz (44.2 kg)    Height:  '5\' 3"'  (1.6 m)      Intake/Output Summary (Last 24 hours) at 01/31/17 1454 Last data filed at 01/31/17 1300  Gross per 24 hour  Intake              600 ml  Output              600 ml  Net                0 ml   Filed Weights   01/31/17 0610 01/31/17 1020  Weight: 102 lb (46.3 kg) 97 lb 6.4 oz (44.2 kg)   Body mass index is 17.25 kg/m.  General:  Well nourished, well developed, in no acute distress HEENT: normal Lymph: no adenopathy Neck: no JVD Endocrine:  No thryomegaly Vascular: No carotid bruits; FA pulses 2+ bilaterally without bruits  Cardiac:  normal S1, S2; RRR; no murmur  Lungs:  clear to auscultation bilaterally, no wheezing, rhonchi or rales  Abd: soft, nontender, no hepatomegaly  Ext: no edema Musculoskeletal:  No deformities, BUE and BLE strength normal and equal Skin: warm and dry  Neuro:  CNs 2-12 intact, no focal abnormalities noted Psych:  Normal affect   EKG:  The EKG was personally reviewed and demonstrates:  NSR with nonspecific ST abnormality Telemetry:   Telemetry was personally reviewed and demonstrates:  NSR  Relevant CV Studies: none  Laboratory Data:  Chemistry Recent Labs Lab 01/31/17 0623  NA 136  K 3.3*  CL 101  CO2 25  GLUCOSE 93  BUN 18  CREATININE 0.79  CALCIUM 9.1  GFRNONAA >60  GFRAA >60  ANIONGAP 10     Recent Labs Lab 01/31/17 0930  PROT 8.0  ALBUMIN 3.5  AST 36  ALT 20  ALKPHOS 30*  BILITOT 0.4   Hematology Recent Labs Lab 01/31/17 0623  WBC 3.1*  RBC 3.13*  HGB 10.3*  HCT 30.3*  MCV 96.8  MCH 32.9  MCHC 34.0  RDW 14.0  PLT 248   Cardiac  Enzymes Recent Labs Lab 01/31/17 1050  TROPONINI 0.06*    Recent Labs Lab 01/31/17 0628  TROPIPOC 0.02    BNPNo results for input(s): BNP, PROBNP in the last 168 hours.  DDimer No results for input(s): DDIMER in the last 168 hours.  Radiology/Studies:  Dg Chest 2 View  Result Date: 01/31/2017 CLINICAL DATA:  83 year old female with sudden onset chest pain this morning. EXAM: CHEST  2 VIEW COMPARISON:  Chest CTA 08/03/2016 and earlier. FINDINGS: Upright AP and lateral views of the chest. Prior CABG. Stable mild cardiomegaly. Other mediastinal contours are within normal limits. Visualized tracheal air column is within normal limits. Stable large lung volumes. No pneumothorax, pulmonary edema, pleural effusion or acute pulmonary opacity. Osteopenia. Stable visualized osseous structures. IMPRESSION: No acute cardiopulmonary abnormality. Electronically Signed   By: Genevie Ann M.D.   On: 01/31/2017 06:55    Assessment and Plan:   1. Chest pain with minimally elevated second trop and EKG with nonspecific ST changes that are unchanged from 07/2016.   - continue ASA/long acting nitrates and statin - start IV Heparin until rule out complete - continue to cycle trop - check 2D echo to assess LVF which was normal at last echo - if flat trend in trop and LVF remains normal then consider nuclear stress test to rule out ischemia  2.  ASCAD s/p remote MI and PCI  of the RCA and LAD with subsequent CABG for restenosis.  Last cath in 2014 showed widely patent LAD and LCx, 90% mid RCA prox to prior stent, patent SVG to RCA, patent stent in D1 with 30% ISR and a 70% ostial narrowing and atretic LIMA to the LAD ad normal LVF. - continue ASA/long acting nitrates and statin  3.  HTN - her BP is adequately controlled - continue amlodipine/Hydralazine  4.  Hyperlipidemia with LDL goal < 70.  - check FLP in am  5.  Hypokalemia K+ 3.3 - replete per [facility]  Signed, Fransico Him, MD  01/31/2017 2:54 PM

## 2017-02-01 DIAGNOSIS — E785 Hyperlipidemia, unspecified: Secondary | ICD-10-CM

## 2017-02-01 DIAGNOSIS — Z951 Presence of aortocoronary bypass graft: Secondary | ICD-10-CM

## 2017-02-01 DIAGNOSIS — K219 Gastro-esophageal reflux disease without esophagitis: Secondary | ICD-10-CM

## 2017-02-01 DIAGNOSIS — Z91018 Allergy to other foods: Secondary | ICD-10-CM

## 2017-02-01 DIAGNOSIS — Z880 Allergy status to penicillin: Secondary | ICD-10-CM

## 2017-02-01 DIAGNOSIS — R0789 Other chest pain: Secondary | ICD-10-CM

## 2017-02-01 DIAGNOSIS — Z803 Family history of malignant neoplasm of breast: Secondary | ICD-10-CM

## 2017-02-01 DIAGNOSIS — C9000 Multiple myeloma not having achieved remission: Secondary | ICD-10-CM

## 2017-02-01 DIAGNOSIS — Z7982 Long term (current) use of aspirin: Secondary | ICD-10-CM

## 2017-02-01 DIAGNOSIS — Z79899 Other long term (current) drug therapy: Secondary | ICD-10-CM

## 2017-02-01 DIAGNOSIS — Z882 Allergy status to sulfonamides status: Secondary | ICD-10-CM

## 2017-02-01 DIAGNOSIS — Z885 Allergy status to narcotic agent status: Secondary | ICD-10-CM

## 2017-02-01 DIAGNOSIS — I10 Essential (primary) hypertension: Secondary | ICD-10-CM | POA: Diagnosis not present

## 2017-02-01 DIAGNOSIS — I25708 Atherosclerosis of coronary artery bypass graft(s), unspecified, with other forms of angina pectoris: Secondary | ICD-10-CM

## 2017-02-01 DIAGNOSIS — I252 Old myocardial infarction: Secondary | ICD-10-CM

## 2017-02-01 DIAGNOSIS — E876 Hypokalemia: Secondary | ICD-10-CM

## 2017-02-01 DIAGNOSIS — Z888 Allergy status to other drugs, medicaments and biological substances status: Secondary | ICD-10-CM

## 2017-02-01 DIAGNOSIS — Z8249 Family history of ischemic heart disease and other diseases of the circulatory system: Secondary | ICD-10-CM

## 2017-02-01 DIAGNOSIS — Z8489 Family history of other specified conditions: Secondary | ICD-10-CM

## 2017-02-01 DIAGNOSIS — I2510 Atherosclerotic heart disease of native coronary artery without angina pectoris: Secondary | ICD-10-CM

## 2017-02-01 DIAGNOSIS — Z881 Allergy status to other antibiotic agents status: Secondary | ICD-10-CM

## 2017-02-01 DIAGNOSIS — Z886 Allergy status to analgesic agent status: Secondary | ICD-10-CM

## 2017-02-01 LAB — CBC
HCT: 28.5 % — ABNORMAL LOW (ref 36.0–46.0)
HEMOGLOBIN: 9.6 g/dL — AB (ref 12.0–15.0)
MCH: 33 pg (ref 26.0–34.0)
MCHC: 33.7 g/dL (ref 30.0–36.0)
MCV: 97.9 fL (ref 78.0–100.0)
PLATELETS: 242 10*3/uL (ref 150–400)
RBC: 2.91 MIL/uL — AB (ref 3.87–5.11)
RDW: 14.3 % (ref 11.5–15.5)
WBC: 4.7 10*3/uL (ref 4.0–10.5)

## 2017-02-01 LAB — LIPID PANEL
CHOL/HDL RATIO: 2 ratio
CHOLESTEROL: 152 mg/dL (ref 0–200)
HDL: 75 mg/dL (ref >40)
LDL Cholesterol: 69 mg/dL (ref 0–99)
TRIGLYCERIDES: 41 mg/dL (ref <150)
VLDL: 8 mg/dL (ref 0–40)

## 2017-02-01 LAB — HEPARIN LEVEL (UNFRACTIONATED): HEPARIN UNFRACTIONATED: 0.52 [IU]/mL (ref 0.30–0.70)

## 2017-02-01 LAB — BASIC METABOLIC PANEL
ANION GAP: 8 (ref 5–15)
BUN: 25 mg/dL — ABNORMAL HIGH (ref 6–20)
CALCIUM: 8.8 mg/dL — AB (ref 8.9–10.3)
CO2: 25 mmol/L (ref 22–32)
CREATININE: 0.85 mg/dL (ref 0.44–1.00)
Chloride: 104 mmol/L (ref 101–111)
Glucose, Bld: 99 mg/dL (ref 65–99)
Potassium: 4.1 mmol/L (ref 3.5–5.1)
SODIUM: 137 mmol/L (ref 135–145)

## 2017-02-01 LAB — TROPONIN I: Troponin I: 0.03 ng/mL (ref <0.03)

## 2017-02-01 NOTE — Progress Notes (Signed)
ANTICOAGULATION CONSULT NOTE - Follow Up Consult  Pharmacy Consult for heparin Indication: chest pain/ACS  Labs:  Recent Labs  01/31/17 0623 01/31/17 1050 01/31/17 1756 01/31/17 2218 02/01/17 0038  HGB 10.3*  --   --   --  9.6*  HCT 30.3*  --   --   --  28.5*  PLT 248  --   --   --  242  HEPARINUNFRC  --   --   --  0.61 0.52  CREATININE 0.79  --   --   --  0.85  TROPONINI  --  0.06* 0.05*  --  0.03*    Assessment: 83yo female with history of CAD with stent placement and prior CABG presents with SOB and right shoulder pain. Heparin levels therapeutic x 2.   CBC stable, no bleeding reported.   Goal of Therapy:  Heparin level 0.3-0.7 units/ml Monitor platelets by anticoagulation protocol: Yes   Plan:  Continue Heparin gtt 500 units/hr Daily CBC and HL Monitor for s/s of bleeding  Leroy Libman, PharmD Pharmacy Resident Pager: [phone]

## 2017-02-01 NOTE — Progress Notes (Signed)
   Subjective: Patient seen and examined. She denies any recurrent chest/shoulder pain since admission. She states that she did have reflux last night after dinner, but it resolved after receiving a GI cocktail. She states the discomfort she experiences with GERD is much different than the pain she experienced the morning of admission and with her prior MI. Her only other complaint was that she experienced some leg cramping overnight night.  She denies any increased risk of bleeding, such as previous GI or GYN bleeds.   Objective:  Vital signs in last 24 hours: Vitals:   01/31/17 1400 01/31/17 2004 01/31/17 2132 02/01/17 0552  BP: (!) 117/52 (!) 134/50 139/78 (!) 130/59  Pulse: (!) 58 65 63 (!) 58  Resp: 16  17 16   Temp: 98.5 F (36.9 C)  98 F (36.7 C) 98.1 F (36.7 C)  TempSrc: Oral  Oral Oral  SpO2: 100% 96% 94% 94%  Weight:    99 lb (44.9 kg)  Height:       General: Laying in bed comfortably, NAD HEENT: [place]/AT, EOMI, no scleral icterus, Cardiac: RRR, No R/M/G appreciated Pulm: normal effort, CTAB Abd: soft, non tender, non distended, BS normal Ext: extremities well perfused, no peripheral edema Neuro: alert and oriented X3, cranial nerves II-XII grossly intact   Assessment/Plan:  Principal Problem:   Chest pain with moderate risk for cardiac etiology Active Problems:   Hypertension   Atherosclerosis of coronary artery bypass graft   Chest pain   SOB (shortness of breath)  Chest pain Typical chest pain with associated shoulder pain and shortness of breath. Symptoms relieved by nitroglycerin. Pt admitted for ACS rule out and has significant cardiac risk factors including CAD, previous MI s/p CABG 2007, NSTEMI 2011, and HTN. Heart score of 5. EKG no evidence of ST elevations, but T wave inversions in aVL more pronounced than in previous EKG. Serial troponin mildly elevated 0.02>>0.06>>0.05. Cardiology consulted. -Continue heparin gtt -Follow up ECHO to assess LVF -Nuclear  stress test today to rule out ischemia -F/u Lipid panel  -Continue Aspirin 81 mg, Crestor 5 mg -Appreciate cardiology recommendations   HTN BP stable at 130/59 today.  -Continue Norvasc 2.5 mg -Continue Hydralazine 25 mg twce daily  Hypokalemia Resolved. BMET in ED with serum potassium of 3.3>>4.1 after repletion.     Dispo: Anticipated discharge in approximately 1 day.   Melanee Spry, MD 02/01/2017, 8:43 AM Pager: [phone]

## 2017-02-01 NOTE — Plan of Care (Signed)
Problem: Activity: Goal: Ability to tolerate increased activity will improve Outcome: Progressing  patient ambulating independently in room with no SOB  Problem: Safety: Goal: Ability to remain free from injury will improve Outcome: Progressing Patient has remained free of injury & falls, patient is complainant in asking for assistance to use the restroom.   Problem: Skin Integrity: Goal: Risk for impaired skin integrity will decrease Outcome: Progressing Has redness in between buttocks, applied moisture barrier creme and a sacral foam pad. Will continue to monitor

## 2017-02-01 NOTE — Progress Notes (Addendum)
Progress Note  Patient Name: [name] Date of Encounter: 02/01/2017  Primary Cardiologist: Dr. Percival Spanish  Subjective   No complaints of CP this am.  Cant get nuclear stress test due to her eating this am  Inpatient Medications    Scheduled Meds: . amLODipine  2.5 mg Oral Daily  . aspirin EC  81 mg Oral Daily  . calcium-vitamin D  1 tablet Oral Q breakfast  . docusate sodium  100 mg Oral Daily  . donepezil  5 mg Oral QHS  . famotidine  40 mg Oral BID  . hydrALAZINE  25 mg Oral BID  . iron polysaccharides  150 mg Oral Daily  . isosorbide mononitrate  60 mg Oral Daily  . loratadine  10 mg Oral q1800  . memantine  10 mg Oral BID  . mirabegron ER  25 mg Oral Daily  . montelukast  10 mg Oral QHS  . multivitamin with minerals  1 tablet Oral Daily  . ramelteon  8 mg Oral QHS  . rosuvastatin  5 mg Oral q1800  . sertraline  25 mg Oral Daily  . sertraline  50 mg Oral QPM   Continuous Infusions: . heparin 500 Units/hr (01/31/17 1507)   PRN Meds: acetaminophen, gi cocktail, nitroGLYCERIN, ondansetron (ZOFRAN) IV   Vital Signs    Vitals:   01/31/17 1400 01/31/17 2004 01/31/17 2132 02/01/17 0552  BP: (!) 117/52 (!) 134/50 139/78 (!) 130/59  Pulse: (!) 58 65 63 (!) 58  Resp: 16  17 16   Temp: 98.5 F (36.9 C)  98 F (36.7 C) 98.1 F (36.7 C)  TempSrc: Oral  Oral Oral  SpO2: 100% 96% 94% 94%  Weight:    99 lb (44.9 kg)  Height:        Intake/Output Summary (Last 24 hours) at 02/01/17 0831 Last data filed at 02/01/17 0500  Gross per 24 hour  Intake              780 ml  Output              950 ml  Net             -170 ml   Filed Weights   01/31/17 0610 01/31/17 1020 02/01/17 0552  Weight: 102 lb (46.3 kg) 97 lb 6.4 oz (44.2 kg) 99 lb (44.9 kg)    Telemetry    NSR - Personally Reviewed  ECG    No new EKG to review - Personally Reviewed  Physical Exam   GEN: No acute distress.   Neck: No JVD Cardiac: RRR, no murmurs, rubs, or gallops.  Respiratory:  Clear to auscultation bilaterally. GI: Soft, nontender, non-distended  MS: No edema; No deformity. Neuro:  Nonfocal  Psych: Normal affect   Labs    Chemistry Recent Labs Lab 01/31/17 0623 01/31/17 0930 02/01/17 0038  NA 136  --  137  K 3.3*  --  4.1  CL 101  --  104  CO2 25  --  25  GLUCOSE 93  --  99  BUN 18  --  25*  CREATININE 0.79  --  0.85  CALCIUM 9.1  --  8.8*  PROT  --  8.0  --   ALBUMIN  --  3.5  --   AST  --  36  --   ALT  --  20  --   ALKPHOS  --  30*  --   BILITOT  --  0.4  --  GFRNONAA >60  --  >60  GFRAA >60  --  >60  ANIONGAP 10  --  8     Hematology Recent Labs Lab 01/31/17 0623 02/01/17 0038  WBC 3.1* 4.7  RBC 3.13* 2.91*  HGB 10.3* 9.6*  HCT 30.3* 28.5*  MCV 96.8 97.9  MCH 32.9 33.0  MCHC 34.0 33.7  RDW 14.0 14.3  PLT 248 242    Cardiac Enzymes Recent Labs Lab 01/31/17 1050 01/31/17 1756 02/01/17 0038  TROPONINI 0.06* 0.05* 0.03*    Recent Labs Lab 01/31/17 0628  TROPIPOC 0.02     BNPNo results for input(s): BNP, PROBNP in the last 168 hours.   DDimer No results for input(s): DDIMER in the last 168 hours.   Radiology    Dg Chest 2 View  Result Date: 01/31/2017 CLINICAL DATA:  83 year old female with sudden onset chest pain this morning. EXAM: CHEST  2 VIEW COMPARISON:  Chest CTA 08/03/2016 and earlier. FINDINGS: Upright AP and lateral views of the chest. Prior CABG. Stable mild cardiomegaly. Other mediastinal contours are within normal limits. Visualized tracheal air column is within normal limits. Stable large lung volumes. No pneumothorax, pulmonary edema, pleural effusion or acute pulmonary opacity. Osteopenia. Stable visualized osseous structures. IMPRESSION: No acute cardiopulmonary abnormality. Electronically Signed   By: Genevie Ann M.D.   On: 01/31/2017 06:55    Cardiac Studies   none  Patient Profile     83 y.o. female  with a hx of GERD, ASCAD s/p remote AWMI in 2007 with BMS to RCA and LAD and subsequent CABG  for restenosis who is being seen for the evaluation of SOB and shoulder pain  Assessment & Plan    1.  Chest pain with minimally elevated trops with flat trend and EKG with nonspecific ST changes that are unchanged from 07/2016.  (0.06/0.05/0.03). - continue ASA/long acting nitrates and statin - continue IV Heparin  - 2D echo to assess LVF pending - given flat and minimally elevated trop will get nuclear stress test tomorrow to rule out ischemia (she ate this am)  2.  ASCAD s/p remote MI and PCI of the RCA and LAD with subsequent CABG for restenosis.  Last cath in 2014 showed widely patent LAD and LCx, 90% mid RCA prox to prior stent, patent SVG to RCA, patent stent in D1 with 30% ISR and a 70% ostial narrowing and atretic LIMA to the LAD ad normal LVF. - continue ASA/long acting nitrates and statin  3.  HTN - her BP is adequately controlled - continue amlodipine/Hydralazine  4.  Hyperlipidemia with LDL goal < 70.  - check FLP this am  5.  Hypokalemia  - repleted (4.1 this am)   Signed, Fransico Him, MD  02/01/2017, 8:31 AM

## 2017-02-01 NOTE — Progress Notes (Addendum)
ANTICOAGULATION CONSULT NOTE - Follow Up Consult  Pharmacy Consult for heparin Indication: chest pain/ACS  Labs:  Recent Labs  01/31/17 0623 01/31/17 1050 01/31/17 1756 01/31/17 2218  HGB 10.3*  --   --   --   HCT 30.3*  --   --   --   PLT 248  --   --   --   HEPARINUNFRC  --   --   --  0.61  CREATININE 0.79  --   --   --   TROPONINI  --  0.06* 0.05*  --     Assessment/Plan:  83yo female therapeutic on heparin with initial dosing for CP. Will continue gtt at current rate and confirm stable with am labs.   Wynona Neat, PharmD, BCPS  02/01/2017,12:04 AM

## 2017-02-02 ENCOUNTER — Encounter (HOSPITAL_COMMUNITY)
Admission: EM | Disposition: A | Discharge status: Skilled Nursing Facility | Payer: Self-pay | Source: Home / Self Care | Attending: Interventional Cardiology

## 2017-02-02 ENCOUNTER — Encounter (HOSPITAL_COMMUNITY): Payer: Self-pay | Admitting: General Practice

## 2017-02-02 ENCOUNTER — Observation Stay (HOSPITAL_COMMUNITY): Payer: PPO

## 2017-02-02 ENCOUNTER — Observation Stay (HOSPITAL_BASED_OUTPATIENT_CLINIC_OR_DEPARTMENT_OTHER): Payer: PPO

## 2017-02-02 DIAGNOSIS — K580 Irritable bowel syndrome with diarrhea: Secondary | ICD-10-CM | POA: Diagnosis present

## 2017-02-02 DIAGNOSIS — J45909 Unspecified asthma, uncomplicated: Secondary | ICD-10-CM | POA: Diagnosis present

## 2017-02-02 DIAGNOSIS — Z955 Presence of coronary angioplasty implant and graft: Secondary | ICD-10-CM | POA: Diagnosis not present

## 2017-02-02 DIAGNOSIS — K219 Gastro-esophageal reflux disease without esophagitis: Secondary | ICD-10-CM | POA: Diagnosis present

## 2017-02-02 DIAGNOSIS — Z79899 Other long term (current) drug therapy: Secondary | ICD-10-CM | POA: Diagnosis not present

## 2017-02-02 DIAGNOSIS — R0789 Other chest pain: Secondary | ICD-10-CM | POA: Diagnosis not present

## 2017-02-02 DIAGNOSIS — Z880 Allergy status to penicillin: Secondary | ICD-10-CM | POA: Diagnosis not present

## 2017-02-02 DIAGNOSIS — T82855A Stenosis of coronary artery stent, initial encounter: Secondary | ICD-10-CM | POA: Diagnosis present

## 2017-02-02 DIAGNOSIS — I351 Nonrheumatic aortic (valve) insufficiency: Secondary | ICD-10-CM | POA: Diagnosis not present

## 2017-02-02 DIAGNOSIS — I252 Old myocardial infarction: Secondary | ICD-10-CM | POA: Diagnosis not present

## 2017-02-02 DIAGNOSIS — I214 Non-ST elevation (NSTEMI) myocardial infarction: Secondary | ICD-10-CM | POA: Diagnosis present

## 2017-02-02 DIAGNOSIS — Z8249 Family history of ischemic heart disease and other diseases of the circulatory system: Secondary | ICD-10-CM | POA: Diagnosis not present

## 2017-02-02 DIAGNOSIS — I25810 Atherosclerosis of coronary artery bypass graft(s) without angina pectoris: Secondary | ICD-10-CM | POA: Diagnosis present

## 2017-02-02 DIAGNOSIS — Z7982 Long term (current) use of aspirin: Secondary | ICD-10-CM | POA: Diagnosis not present

## 2017-02-02 DIAGNOSIS — I10 Essential (primary) hypertension: Secondary | ICD-10-CM | POA: Diagnosis present

## 2017-02-02 DIAGNOSIS — J309 Allergic rhinitis, unspecified: Secondary | ICD-10-CM | POA: Diagnosis present

## 2017-02-02 DIAGNOSIS — I25110 Atherosclerotic heart disease of native coronary artery with unstable angina pectoris: Secondary | ICD-10-CM | POA: Diagnosis not present

## 2017-02-02 DIAGNOSIS — Z886 Allergy status to analgesic agent status: Secondary | ICD-10-CM | POA: Diagnosis not present

## 2017-02-02 DIAGNOSIS — Y831 Surgical operation with implant of artificial internal device as the cause of abnormal reaction of the patient, or of later complication, without mention of misadventure at the time of the procedure: Secondary | ICD-10-CM | POA: Diagnosis present

## 2017-02-02 DIAGNOSIS — C9000 Multiple myeloma not having achieved remission: Secondary | ICD-10-CM | POA: Diagnosis present

## 2017-02-02 DIAGNOSIS — Z881 Allergy status to other antibiotic agents status: Secondary | ICD-10-CM | POA: Diagnosis not present

## 2017-02-02 DIAGNOSIS — F410 Panic disorder [episodic paroxysmal anxiety] without agoraphobia: Secondary | ICD-10-CM | POA: Diagnosis present

## 2017-02-02 DIAGNOSIS — Z951 Presence of aortocoronary bypass graft: Secondary | ICD-10-CM | POA: Diagnosis not present

## 2017-02-02 DIAGNOSIS — E7800 Pure hypercholesterolemia, unspecified: Secondary | ICD-10-CM | POA: Diagnosis present

## 2017-02-02 DIAGNOSIS — R079 Chest pain, unspecified: Secondary | ICD-10-CM | POA: Diagnosis present

## 2017-02-02 DIAGNOSIS — Z885 Allergy status to narcotic agent status: Secondary | ICD-10-CM | POA: Diagnosis not present

## 2017-02-02 DIAGNOSIS — Z9889 Other specified postprocedural states: Secondary | ICD-10-CM | POA: Diagnosis not present

## 2017-02-02 DIAGNOSIS — D649 Anemia, unspecified: Secondary | ICD-10-CM | POA: Diagnosis not present

## 2017-02-02 DIAGNOSIS — R54 Age-related physical debility: Secondary | ICD-10-CM | POA: Diagnosis present

## 2017-02-02 DIAGNOSIS — E876 Hypokalemia: Secondary | ICD-10-CM | POA: Diagnosis present

## 2017-02-02 DIAGNOSIS — I25708 Atherosclerosis of coronary artery bypass graft(s), unspecified, with other forms of angina pectoris: Secondary | ICD-10-CM | POA: Diagnosis not present

## 2017-02-02 HISTORY — PX: CORONARY STENT INTERVENTION: CATH118234

## 2017-02-02 HISTORY — PX: LEFT HEART CATH AND CORS/GRAFTS ANGIOGRAPHY: CATH118250

## 2017-02-02 LAB — CBC
HCT: 28.3 % — ABNORMAL LOW (ref 36.0–46.0)
HEMOGLOBIN: 9.5 g/dL — AB (ref 12.0–15.0)
MCH: 33 pg (ref 26.0–34.0)
MCHC: 33.6 g/dL (ref 30.0–36.0)
MCV: 98.3 fL (ref 78.0–100.0)
PLATELETS: 224 10*3/uL (ref 150–400)
RBC: 2.88 MIL/uL — AB (ref 3.87–5.11)
RDW: 14.4 % (ref 11.5–15.5)
WBC: 4.5 10*3/uL (ref 4.0–10.5)

## 2017-02-02 LAB — PROTIME-INR
INR: 1.09
Prothrombin Time: 14.1 seconds (ref 11.4–15.2)

## 2017-02-02 LAB — ECHOCARDIOGRAM COMPLETE
AVPHT: 373 ms
CHL CUP MV DEC (S): 102
E decel time: 102 msec
EERAT: 9.28
FS: 24 % — AB (ref 28–44)
HEIGHTINCHES: 63 in
IVS/LV PW RATIO, ED: 1
LA diam end sys: 31 mm
LA diam index: 2.21 cm/m2
LA vol A4C: 50.6 ml
LASIZE: 31 mm
LAVOL: 47.2 mL
LAVOLIN: 33.6 mL/m2
LDCA: 2.84 cm2
LVEEAVG: 9.28
LVEEMED: 9.28
LVELAT: 11.1 cm/s
LVOT diameter: 19 mm
Lateral S' vel: 14.3 cm/s
MVPG: 4 mmHg
MVPKAVEL: 88.3 m/s
MVPKEVEL: 103 m/s
PW: 9 mm — AB (ref 0.6–1.1)
RV TAPSE: 20.4 mm
Reg peak vel: 244 cm/s
TDI e' lateral: 11.1
TDI e' medial: 6.2
TRMAXVEL: 244 cm/s
WEIGHTICAEL: 1584 [oz_av]

## 2017-02-02 LAB — POCT ACTIVATED CLOTTING TIME
ACTIVATED CLOTTING TIME: 279 s
Activated Clotting Time: 356 seconds
Activated Clotting Time: 164 seconds

## 2017-02-02 LAB — HEPARIN LEVEL (UNFRACTIONATED): HEPARIN UNFRACTIONATED: 0.3 [IU]/mL (ref 0.30–0.70)

## 2017-02-02 SURGERY — LEFT HEART CATH AND CORS/GRAFTS ANGIOGRAPHY
Anesthesia: LOCAL

## 2017-02-02 MED ORDER — TRAMADOL HCL 50 MG PO TABS
50.0000 mg | ORAL_TABLET | Freq: Once | ORAL | Status: AC | PRN
  Administered 2017-02-02: 50 mg via ORAL
  Filled 2017-02-02: qty 1

## 2017-02-02 MED ORDER — SODIUM CHLORIDE 0.9 % IV SOLN: 250.0000 mL | INTRAVENOUS | Status: DC | PRN

## 2017-02-02 MED ORDER — HYDRALAZINE HCL 20 MG/ML IJ SOLN: 5.0000 mg | INTRAMUSCULAR | Status: AC | PRN

## 2017-02-02 MED ORDER — HEPARIN (PORCINE) IN NACL 2-0.9 UNIT/ML-% IJ SOLN
INTRAMUSCULAR | Status: AC | PRN
  Administered 2017-02-02: 1000 mL

## 2017-02-02 MED ORDER — NITROGLYCERIN 0.4 MG SL SUBL
SUBLINGUAL_TABLET | SUBLINGUAL | Status: AC
  Filled 2017-02-02: qty 2

## 2017-02-02 MED ORDER — FENTANYL CITRATE (PF) 100 MCG/2ML IJ SOLN
INTRAMUSCULAR | Status: AC
  Filled 2017-02-02: qty 2

## 2017-02-02 MED ORDER — MIDAZOLAM HCL 2 MG/2ML IJ SOLN
INTRAMUSCULAR | Status: DC | PRN
  Administered 2017-02-02 (×2): 1 mg via INTRAVENOUS

## 2017-02-02 MED ORDER — HEPARIN SODIUM (PORCINE) 5000 UNIT/ML IJ SOLN
5000.0000 [IU] | Freq: Three times a day (TID) | INTRAMUSCULAR | Status: DC
  Administered 2017-02-03 – 2017-02-05 (×7): 5000 [IU] via SUBCUTANEOUS
  Filled 2017-02-02 (×9): qty 1

## 2017-02-02 MED ORDER — LIDOCAINE HCL (PF) 1 % IJ SOLN
INTRAMUSCULAR | Status: DC | PRN
  Administered 2017-02-02: 1 mL

## 2017-02-02 MED ORDER — ATORVASTATIN CALCIUM 80 MG PO TABS
80.0000 mg | ORAL_TABLET | Freq: Every day | ORAL | Status: DC
  Administered 2017-02-03 – 2017-02-05 (×3): 80 mg via ORAL
  Filled 2017-02-02 (×3): qty 1

## 2017-02-02 MED ORDER — IOPAMIDOL (ISOVUE-370) INJECTION 76%
INTRAVENOUS | Status: AC
  Filled 2017-02-02: qty 100

## 2017-02-02 MED ORDER — SODIUM CHLORIDE 0.9% FLUSH: 3.0000 mL | INTRAVENOUS | Status: DC | PRN

## 2017-02-02 MED ORDER — ACETAMINOPHEN 325 MG PO TABS
650.0000 mg | ORAL_TABLET | ORAL | Status: DC | PRN
  Administered 2017-02-02 – 2017-02-05 (×2): 650 mg via ORAL
  Filled 2017-02-02 (×3): qty 2

## 2017-02-02 MED ORDER — NITROGLYCERIN 1 MG/10 ML FOR IR/CATH LAB
INTRA_ARTERIAL | Status: DC | PRN
  Administered 2017-02-02: 200 ug via INTRACORONARY

## 2017-02-02 MED ORDER — HEPARIN (PORCINE) IN NACL 2-0.9 UNIT/ML-% IJ SOLN
INTRAMUSCULAR | Status: DC | PRN
  Administered 2017-02-02: 8 mL via INTRA_ARTERIAL

## 2017-02-02 MED ORDER — ASPIRIN 81 MG PO CHEW: 81.0000 mg | CHEWABLE_TABLET | Freq: Every day | ORAL | Status: DC

## 2017-02-02 MED ORDER — NITROGLYCERIN 0.4 MG SL SUBL
SUBLINGUAL_TABLET | SUBLINGUAL | Status: AC
  Administered 2017-02-02: 0.4 mg via SUBLINGUAL
  Filled 2017-02-02: qty 1

## 2017-02-02 MED ORDER — FENTANYL CITRATE (PF) 100 MCG/2ML IJ SOLN
INTRAMUSCULAR | Status: DC | PRN
  Administered 2017-02-02: 50 ug via INTRAVENOUS
  Administered 2017-02-02: 25 ug via INTRAVENOUS

## 2017-02-02 MED ORDER — MIDAZOLAM HCL 2 MG/2ML IJ SOLN
INTRAMUSCULAR | Status: AC
  Filled 2017-02-02: qty 2

## 2017-02-02 MED ORDER — LABETALOL HCL 5 MG/ML IV SOLN: 10.0000 mg | INTRAVENOUS | Status: AC | PRN

## 2017-02-02 MED ORDER — TICAGRELOR 90 MG PO TABS
90.0000 mg | ORAL_TABLET | Freq: Two times a day (BID) | ORAL | Status: DC
  Administered 2017-02-03 – 2017-02-05 (×7): 90 mg via ORAL
  Filled 2017-02-02 (×7): qty 1

## 2017-02-02 MED ORDER — ONDANSETRON HCL 4 MG/2ML IJ SOLN: 4.0000 mg | Freq: Four times a day (QID) | INTRAMUSCULAR | Status: DC | PRN

## 2017-02-02 MED ORDER — TICAGRELOR 90 MG PO TABS
ORAL_TABLET | ORAL | Status: DC | PRN
  Administered 2017-02-02: 180 mg via ORAL

## 2017-02-02 MED ORDER — ASPIRIN 81 MG PO CHEW: 81.0000 mg | CHEWABLE_TABLET | ORAL | Status: DC

## 2017-02-02 MED ORDER — HYDRALAZINE HCL 20 MG/ML IJ SOLN
INTRAMUSCULAR | Status: AC
  Filled 2017-02-02: qty 1

## 2017-02-02 MED ORDER — SODIUM CHLORIDE 0.9 % WEIGHT BASED INFUSION
3.0000 mL/kg/h | INTRAVENOUS | Status: DC
  Administered 2017-02-02: 3 mL/kg/h via INTRAVENOUS

## 2017-02-02 MED ORDER — LIDOCAINE HCL (PF) 1 % IJ SOLN
INTRAMUSCULAR | Status: AC
  Filled 2017-02-02: qty 30

## 2017-02-02 MED ORDER — SODIUM CHLORIDE 0.9% FLUSH: 3.0000 mL | Freq: Two times a day (BID) | INTRAVENOUS | Status: DC

## 2017-02-02 MED ORDER — IOPAMIDOL (ISOVUE-370) INJECTION 76%
INTRAVENOUS | Status: DC | PRN
  Administered 2017-02-02: 140 mL via INTRA_ARTERIAL

## 2017-02-02 MED ORDER — HEPARIN SODIUM (PORCINE) 1000 UNIT/ML IJ SOLN
INTRAMUSCULAR | Status: AC
  Filled 2017-02-02: qty 1

## 2017-02-02 MED ORDER — REGADENOSON 0.4 MG/5ML IV SOLN
INTRAVENOUS | Status: AC
  Filled 2017-02-02: qty 5

## 2017-02-02 MED ORDER — ENSURE ENLIVE PO LIQD: 237.0000 mL | Freq: Two times a day (BID) | ORAL | Status: DC

## 2017-02-02 MED ORDER — HEPARIN (PORCINE) IN NACL 2-0.9 UNIT/ML-% IJ SOLN
INTRAMUSCULAR | Status: AC
  Filled 2017-02-02: qty 500

## 2017-02-02 MED ORDER — TECHNETIUM TC 99M TETROFOSMIN IV KIT
10.0000 | PACK | Freq: Once | INTRAVENOUS | Status: AC | PRN
  Administered 2017-02-02: 10 via INTRAVENOUS

## 2017-02-02 MED ORDER — REGADENOSON 0.4 MG/5ML IV SOLN
0.4000 mg | Freq: Once | INTRAVENOUS | Status: DC
  Filled 2017-02-02: qty 5

## 2017-02-02 MED ORDER — SODIUM CHLORIDE 0.9% FLUSH
3.0000 mL | Freq: Two times a day (BID) | INTRAVENOUS | Status: DC
  Administered 2017-02-02 – 2017-02-05 (×5): 3 mL via INTRAVENOUS

## 2017-02-02 MED ORDER — SODIUM CHLORIDE 0.9 % WEIGHT BASED INFUSION: 1.5000 mL/kg/h | INTRAVENOUS | Status: AC

## 2017-02-02 MED ORDER — HEPARIN SODIUM (PORCINE) 1000 UNIT/ML IJ SOLN
INTRAMUSCULAR | Status: DC | PRN
  Administered 2017-02-02: 2500 [IU] via INTRAVENOUS
  Administered 2017-02-02: 1500 [IU] via INTRAVENOUS
  Administered 2017-02-02: 5000 [IU] via INTRAVENOUS

## 2017-02-02 MED ORDER — LABETALOL HCL 5 MG/ML IV SOLN
10.0000 mg | Freq: Once | INTRAVENOUS | Status: AC
  Administered 2017-02-02: 10 mg via INTRAVENOUS

## 2017-02-02 MED ORDER — SODIUM CHLORIDE 0.9 % WEIGHT BASED INFUSION: 1.0000 mL/kg/h | INTRAVENOUS | Status: DC

## 2017-02-02 MED ORDER — LABETALOL HCL 5 MG/ML IV SOLN
INTRAVENOUS | Status: AC
  Filled 2017-02-02: qty 4

## 2017-02-02 MED ORDER — NITROGLYCERIN 1 MG/10 ML FOR IR/CATH LAB
INTRA_ARTERIAL | Status: AC
  Filled 2017-02-02: qty 10

## 2017-02-02 SURGICAL SUPPLY — 27 items
BALLN EMERGE MR 2.25X12 (BALLOONS) ×2
BALLN ~~LOC~~ EMERGE MR 2.5X6 (BALLOONS) ×2
BALLOON EMERGE MR 2.25X12 (BALLOONS) IMPLANT
BALLOON ~~LOC~~ EMERGE MR 2.5X6 (BALLOONS) IMPLANT
CATH EXPO 5F FL3.5 (CATHETERS) ×1 IMPLANT
CATH INFINITI JR4 5F (CATHETERS) ×1 IMPLANT
CATH VISTA GUIDE 6FR XB3 (CATHETERS) ×1 IMPLANT
COVER PRB 48X5XTLSCP FOLD TPE (BAG) IMPLANT
COVER PROBE 5X48 (BAG) ×2
DEVICE RAD COMP TR BAND LRG (VASCULAR PRODUCTS) ×1 IMPLANT
DEVICE RAD TR BAND REGULAR (VASCULAR PRODUCTS) ×1 IMPLANT
GLIDESHEATH SLEND A-KIT 6F 22G (SHEATH) ×1 IMPLANT
GUIDEWIRE INQWIRE 1.5J.035X260 (WIRE) IMPLANT
INQWIRE 1.5J .035X260CM (WIRE) ×2
KIT ENCORE 26 ADVANTAGE (KITS) ×1 IMPLANT
KIT ESSENTIALS PG (KITS) ×1 IMPLANT
KIT HEART LEFT (KITS) ×2 IMPLANT
PACK CARDIAC CATHETERIZATION (CUSTOM PROCEDURE TRAY) ×2 IMPLANT
PROTECTION STATION PRESSURIZED (MISCELLANEOUS) ×2
STATION PROTECTION PRESSURIZED (MISCELLANEOUS) IMPLANT
STENT SIERRA 2.25 X 12 MM (Permanent Stent) ×1 IMPLANT
TRANSDUCER W/STOPCOCK (MISCELLANEOUS) ×2 IMPLANT
TUBING ART PRESS 72  MALE/FEM (TUBING) ×1
TUBING ART PRESS 72 MALE/FEM (TUBING) IMPLANT
TUBING CIL FLEX 10 FLL-RA (TUBING) ×2 IMPLANT
WIRE ASAHI PROWATER 180CM (WIRE) ×2 IMPLANT
WIRE HI TORQ VERSACORE-J 145CM (WIRE) ×1 IMPLANT

## 2017-02-02 NOTE — Progress Notes (Signed)
    Pt presented to stress lab for lexiscan Myoview. She developed an elevated blood pressure 205/67. She then developed chest pain 10/10, substernal, non-radiating. After 3 SL NTG he chest pain has come down to 3/10 and BP 174/82. Her her HR went up during pain fromm 71 to 102 and she had some mild ST changes in the lateral leads.   The patient was quite anxious about the stress test because the last time she had one she had diarrhea. The patient thinks that she would rather define her chest pain with cardiac cath. She has history of remote CABG and multiple stents. She would like to speak with Dr. Sallyanne Kuster. Will cancel stress test for now and consider cardiac cath.   Labetalol IV given with heart rate back to the 70's and BP 171/79.  Daune Perch, AGNP-C [facility] 02/02/2017  11:02 AM Pager: [phone]  Discussed with the patient and her son. She really does not want a stress test or nuclear study. Getting echo as we speak. Symptoms are concerning for unstable angina. Will proceed with coronary angiography, probably femoral approach due to LIMA and SVG, This procedure, including possible angioplasty/stent has been fully reviewed with the patient and written informed consent has been obtained.  Sanda Klein, MD, [facility] [phone] office [phone] pager

## 2017-02-02 NOTE — Progress Notes (Deleted)
Nitro patch removed at [id number] for lexiscan stress test.

## 2017-02-02 NOTE — Progress Notes (Signed)
Came to Radiology nurses station for stress portion of Lexiscan stress test. Had one 3 years ago and got severe diarrhea. Looks anxious. Test was explained. On arrival of PA N Hammon, noted initial BP was elevated 205/67 at 1012. Repeat at 1018 224/75, HR 80. Feels need to void. Used bedpan to void. Then started to complain of chest pressure 10/10, N Hammon PA present. No radiation, diaphoresis, or SOB. Color remains same, pale pink. BP 233/104. Chest pain "20"/10. Was given 3 nitro and 10mg  Labetalol. And chest pain, BP and HR improved. See VS. PA spoke to Dr Recardo Evangelist twice. Patient does not really want stress portion of test, would prefer heart cath. Patient to be kept NPO except for sips of water with meds. This was passed on in report to [place] her nurse on 3W. She was transported back to her room on stretcher by Wyvonne Lenz RN, chest pressure 1/10 on 3W, her son and daughter were there and with patient permission they were updated on her V/S and chest pain and that heart cath will be done instead. Report was given to [facility] re chest pain, BP, and meds given and VS.

## 2017-02-02 NOTE — Progress Notes (Signed)
  Echocardiogram 2D Echocardiogram has been performed.  Merve Hotard G Darcella Shiffman 02/02/2017, 2:15 PM

## 2017-02-02 NOTE — Interval H&P Note (Signed)
Cath Lab Visit (complete for each Cath Lab visit)  Clinical Evaluation Leading to the Procedure:   ACS: Yes.    Non-ACS:    Anginal Classification: CCS III  Anti-ischemic medical therapy: Minimal Therapy (1 class of medications)  Non-Invasive Test Results: No non-invasive testing performed  Prior CABG: Previous CABG      History and Physical Interval Note:  02/02/2017 4:03 PM   The patient is well known to me. She has history of remote CABG for restenosis of LAD diagonal stents. 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 Native vessels remained widely patent. She now presents with a chest pain syndrome. She has had multiple hospitalizations over the years for chest pain, has had repeat cardiac caths under circumstances felt to be ACS without significant obstructive disease being found. This is a similar such presentation.  [name]  has presented today for surgery, with the diagnosis of cp  The various methods of treatment have been discussed with the patient and family. After consideration of risks, benefits and other options for treatment, the patient has consented to  Procedure(s): LEFT HEART CATH AND CORS/GRAFTS ANGIOGRAPHY (N/A) as a surgical intervention .  The patient's history has been reviewed, patient examined, no change in status, stable for surgery.  I have reviewed the patient's chart and labs.  Questions were answered to the patient's satisfaction.     Belva Crome III

## 2017-02-02 NOTE — Progress Notes (Signed)
Progress Note  Patient Name: [name] Date of Encounter: 02/02/2017  Primary Cardiologist: Dr. Percival Spanish   Subjective   Getting NST done today.   Inpatient Medications    Scheduled Meds: . amLODipine  2.5 mg Oral Daily  . aspirin EC  81 mg Oral Daily  . calcium-vitamin D  1 tablet Oral Q breakfast  . docusate sodium  100 mg Oral Daily  . donepezil  5 mg Oral QHS  . famotidine  40 mg Oral BID  . hydrALAZINE  25 mg Oral BID  . iron polysaccharides  150 mg Oral Daily  . isosorbide mononitrate  60 mg Oral Daily  . loratadine  10 mg Oral q1800  . memantine  10 mg Oral BID  . mirabegron ER  25 mg Oral Daily  . montelukast  10 mg Oral QHS  . multivitamin with minerals  1 tablet Oral Daily  . ramelteon  8 mg Oral QHS  . rosuvastatin  5 mg Oral q1800  . sertraline  25 mg Oral Daily  . sertraline  50 mg Oral QPM   Continuous Infusions: . heparin 500 Units/hr (01/31/17 1507)   PRN Meds: acetaminophen, gi cocktail, nitroGLYCERIN, ondansetron (ZOFRAN) IV   Vital Signs    Vitals:   02/01/17 1509 02/01/17 2100 02/02/17 0300 02/02/17 0556  BP: (!) 112/50 (!) 157/60 (!) 143/56   Pulse: 69 69  79  Resp: 18 16  18   Temp: 98 F (36.7 C) 97.8 F (36.6 C) 98.1 F (36.7 C) 98.2 F (36.8 C)  TempSrc: Oral Oral Oral Oral  SpO2: 94% 98% 95% 94%  Weight:      Height:        Intake/Output Summary (Last 24 hours) at 02/02/17 0828 Last data filed at 02/02/17 0300  Gross per 24 hour  Intake             1060 ml  Output              851 ml  Net              209 ml   Filed Weights   01/31/17 0610 01/31/17 1020 02/01/17 0552  Weight: 102 lb (46.3 kg) 97 lb 6.4 oz (44.2 kg) 99 lb (44.9 kg)    Telemetry    NSR - Personally Reviewed  ECG    NSR nonspecific ST changes that are unchanged from 07/2016 - Personally Reviewed  Physical Exam   GEN: No acute distress.   Neck: No JVD Cardiac: RRR, no murmurs, rubs, or gallops.  Respiratory: Clear to auscultation  bilaterally. GI: Soft, nontender, non-distended  MS: No edema; No deformity. Neuro:  Nonfocal  Psych: Normal affect   Labs    Chemistry Recent Labs Lab 01/31/17 0623 01/31/17 0930 02/01/17 0038  NA 136  --  137  K 3.3*  --  4.1  CL 101  --  104  CO2 25  --  25  GLUCOSE 93  --  99  BUN 18  --  25*  CREATININE 0.79  --  0.85  CALCIUM 9.1  --  8.8*  PROT  --  8.0  --   ALBUMIN  --  3.5  --   AST  --  36  --   ALT  --  20  --   ALKPHOS  --  30*  --   BILITOT  --  0.4  --   GFRNONAA >60  --  >60  GFRAA >60  --  >  [place]  --  8     Hematology Recent Labs Lab 01/31/17 0623 02/01/17 0038 02/02/17 0301  WBC 3.1* 4.7 4.5  RBC 3.13* 2.91* 2.88*  HGB 10.3* 9.6* 9.5*  HCT 30.3* 28.5* 28.3*  MCV 96.8 97.9 98.3  MCH 32.9 33.0 33.0  MCHC 34.0 33.7 33.6  RDW 14.0 14.3 14.4  PLT 248 242 224    Cardiac Enzymes Recent Labs Lab 01/31/17 1050 01/31/17 1756 02/01/17 0038  TROPONINI 0.06* 0.05* 0.03*    Recent Labs Lab 01/31/17 0628  TROPIPOC 0.02     BNPNo results for input(s): BNP, PROBNP in the last 168 hours.   DDimer No results for input(s): DDIMER in the last 168 hours.   Radiology    No results found.  Cardiac Studies   NST - pending   Patient Profile     83 y.o. female with a hx of GERD, ASCAD s/p remote AWMI in 2007 with BMS to RCA and LAD and subsequent CABG for restenosiswho is being seen for the evaluation of SOB and shoulder pain. Serial troponins minimally elevated with flat trend.   Assessment & Plan    1. Chest Pain: EKG with nonspecific ST changes that are unchanged from 07/2016. Serial troponins minimally elevated with flat trend (0.06>>0.05>>0.03). NST today to r/o ischemia.   2. CAD: s/p remote MI and PCI of the RCA and LAD with subsequent CABG for restenosis. Last cath in 2014 showed widely patent LAD and LCx, 90% mid RCA prox to prior stent, patent SVG to RCA, patent stent in D1 with 30% ISR and a 70% ostial narrowing and  atretic LIMA to the LAD ad normal LVF. Given recent CP, plan is for NST today to r/o ischemia. Continue medical therapy with ASA, statin and LA nitrate.   3. HTN: mildly elevated this am, however am meds not yet given. Pt to receive with return to unit post NST.  Continue amlodipine, hydralazine, Imdur.   4. HLD: LDL is at goal at 69 mg/dL. Continue Crestor, 5 mg.   Further recs to follow based on nuclear stress test results.   Signed, Lyda Jester, PA-C  02/02/2017, 8:28 AM    I have seen and examined the patient along with Lyda Jester, PA-C .  I have reviewed the chart, notes and new data.  I agree with PA/NP's note.  Key new complaints: currently minimal chest discomfort, angina worsened when she had elevated BP earlier Key examination changes: no overt CHF, no abnormality in rhythm Key new findings / data: Hgb 9.5, but stable, normocytic, normochromic, only slightly lower compared to 2017 (10.4-11.0). Mildly increased troponin I 0.03-0.06, excellent lipid profile. ECG NSR with very subtle nonspecific lateral ST depression, upright T waves, worsened during elevated BP when down in nuclear medicine.  PLAN: She declines nuclear stress testing due to poor experience in the past. Dynamic ECG changes raise concern for significant coronary insufficiency. Proceed with heart cath today.  Sanda Klein, MD, [place] [phone] 02/02/2017, 1:47 PM

## 2017-02-02 NOTE — Progress Notes (Signed)
   Subjective: Patient seen and examined. She states she had an episode of chest pressure at 3 AM last night after she was woken up to have her blood drawn. She associated with anxiety and does not think her heart was the source of the pressure.  She did not use any SL nitroglycerin and the pain subsided after several minutes. She was concerned about her stress test this morning because she had diarrhea during her previous lexiscan myoview. No other complaints at this time.  Objective:  Vital signs in last 24 hours: Vitals:   02/01/17 1509 02/01/17 2100 02/02/17 0300 02/02/17 0556  BP: (!) 112/50 (!) 157/60 (!) 143/56   Pulse: 69 69  79  Resp: 18 16  18   Temp: 98 F (36.7 C) 97.8 F (36.6 C) 98.1 F (36.7 C) 98.2 F (36.8 C)  TempSrc: Oral Oral Oral Oral  SpO2: 94% 98% 95% 94%  Weight:      Height:       General: Laying in bed comfortably, NAD HEENT: [place]/AT, EOMI, no scleral icterus, Cardiac: RRR, No R/M/G appreciated Pulm: normal effort, CTAB Abd: soft, non tender, non distended, BS normal Ext: extremities well perfused, no peripheral edema Neuro: alert and oriented X3, cranial nerves II-XII grossly intact   Assessment/Plan:  Principal Problem:   Chest pain with moderate risk for cardiac etiology Active Problems:   Hypertension   Atherosclerosis of coronary artery bypass graft   Chest pain   SOB (shortness of breath)  Chest pain Typical chest pain with associated shoulder pain and shortness of breath, relieved by SL NTG. Heart score of 5. Serial troponin mildly elevated 0.02>>0.06>>0.05. Lipid panel within normal limits. Episode of chest pain over night, repeat EKG showed no acute ischemic changes. Patient had myoview lexiscan today and during the procedure developed an elevated blood pressure 205/67, tachycardia, substernal chest pain, relieved after 3 SL NTG with mild ST changes in the lateral leads.  -Continue heparin gtt -Appreciate cardiology recommendations>>?cardiac  catheterization  -Continue Aspirin 81 mg, Crestor 5 mg  HTN BP stable overnight. Increased to 170-200/80-100 during lexiscan myoview. Returned to 170/80 after dose of IV labetalol -Continue Norvasc 2.5 mg -Continue Hydralazine 25 mg twce daily   Dispo: Anticipated discharge in approximately 1-2 days.   Melanee Spry, MD 02/02/2017, 8:29 AM Pager: [phone]

## 2017-02-02 NOTE — Progress Notes (Signed)
Initial Nutrition Assessment  DOCUMENTATION CODES:   Severe malnutrition in context of chronic illness, Underweight  INTERVENTION:  - Ensure Enlive po BID, each supplement provides 350 kcal and 20 grams of protein - Recommend liberalizing pt's diet to promote PO intake   NUTRITION DIAGNOSIS:   Malnutrition (Severe) related to chronic illness (IBS) as evidenced by severe depletion of muscle mass, severe depletion of body fat.  GOAL:   Patient will meet greater than or equal to 90% of their needs  MONITOR:   PO intake, Supplement acceptance, Weight trends, I & O's  REASON FOR ASSESSMENT:   Malnutrition Screening Tool    ASSESSMENT:   Pt with PMH of HTN, GERD, IBS, Anxiety, CAD, HLD, Osteopenia, Multiple myeloma, Chronic anemia, presents with chest pain with moderate risk for cardiac etiology  Spoke with pt and pt's family at bedside.  Pt's reports weight loss of ~15 lbs last summer and the she never been able to gain the wt back. Per chart review, no significant weight changes per time period available.   Pt reports eating three meals per day at [facility] where she stays. Pt reports occasionally consuming vanilla Boost PTA however often times forgets. Pt amendable to nutrition supplementation during this admission.   Pt reports a decreased PO intake d/t spices used in foods and asked for bland diet suggestions. Per pt report pt's GI tract reacts to spices, onions and acidic food/beverages. Per pt report and chart review, pt's IBS has been a chronic ongoing issue.   Per meal completion records, pt has consumed 0-100% of meals during this admission.  Labs reviewed; Hemoglobin 9.5 Medications reviewed; Calcium-vitamin D, Colace, Pepcid, Iron polysaccharides, Multivitamin, GI cocktail  Nutrition focused physical exam completed however limited d/t RN placing IV. Findings include moderate to severe muscle depletion, moderate to severe fat depletion, and no edema.   Diet  Order:  Diet NPO time specified  Skin:  Reviewed, no issues  Last BM:  02/01/17  Height:   Ht Readings from Last 1 Encounters:  01/31/17 '5\' 3"'  (1.6 m)    Weight:   Wt Readings from Last 1 Encounters:  02/01/17 99 lb (44.9 kg)    Ideal Body Weight:  52 kg  BMI:  Body mass index is 17.54 kg/m.  Estimated Nutritional Needs:   Kcal:  1300-1500  Protein:  65-80 grams  Fluid:  >/= 1.5 L/d  EDUCATION NEEDS:   Education needs addressed  Parks Ranger, RDN 02/02/2017 4:41 PM

## 2017-02-02 NOTE — H&P (View-Only) (Signed)
    Pt presented to stress lab for lexiscan Myoview. She developed an elevated blood pressure 205/67. She then developed chest pain 10/10, substernal, non-radiating. After 3 SL NTG he chest pain has come down to 3/10 and BP 174/82. Her her HR went up during pain fromm 71 to 102 and she had some mild ST changes in the lateral leads.   The patient was quite anxious about the stress test because the last time she had one she had diarrhea. The patient thinks that she would rather define her chest pain with cardiac cath. She has history of remote CABG and multiple stents. She would like to speak with Dr. Sallyanne Kuster. Will cancel stress test for now and consider cardiac cath.   Labetalol IV given with heart rate back to the 70's and BP 171/79.  Daune Perch, AGNP-C [facility] 02/02/2017  11:02 AM Pager: [phone]  Discussed with the patient and her son. She really does not want a stress test or nuclear study. Getting echo as we speak. Symptoms are concerning for unstable angina. Will proceed with coronary angiography, probably femoral approach due to LIMA and SVG, This procedure, including possible angioplasty/stent has been fully reviewed with the patient and written informed consent has been obtained.  Sanda Klein, MD, [facility] [phone] office [phone] pager

## 2017-02-02 NOTE — Progress Notes (Signed)
ACT checked; deflation of TR band started 2300. Paged Dr. Trilby Drummer with IMTS rounding- because patient states she has severe pain in her right arm around the site of the TR band.  Tramadol ordered.  Dr. Trilby Drummer states he will come examine the patient if RN request.  At this time the site is stable and is being managed with coban, ice, elevation and reduction of pressure from the TR band, but reassured I will page if any changes.

## 2017-02-02 NOTE — Progress Notes (Signed)
ANTICOAGULATION CONSULT NOTE - Follow Up Consult  Pharmacy Consult for heparin Indication: chest pain/ACS  Labs:  Recent Labs  01/31/17 0623 01/31/17 1050 01/31/17 1756 01/31/17 2218 02/01/17 0038 02/02/17 0301  HGB 10.3*  --   --   --  9.6* 9.5*  HCT 30.3*  --   --   --  28.5* 28.3*  PLT 248  --   --   --  242 224  HEPARINUNFRC  --   --   --  0.61 0.52 0.30  CREATININE 0.79  --   --   --  0.85  --   TROPONINI  --  0.06* 0.05*  --  0.03*  --     Assessment: 83 yo female with history of CAD with stent placement and prior CABG presents with SOB and right shoulder pain. Heparin levels therapeutic x 3.  Heparin level on the low end of therapeutic this AM, 0.30, so will increase rate slightly.  CBC stable, no bleeding reported.   Goal of Therapy:  Heparin level 0.3-0.7 units/ml Monitor platelets by anticoagulation protocol: Yes   Plan:  Increase Heparin gtt to 550 units/hr 8 hour Heparin level @ 1900 Daily CBC and HL Monitor for s/sx of bleeding    Trinitee Horgan L. Kyung Rudd, PharmD, [place] PGY1 Pharmacy Resident Pager: [phone]

## 2017-02-02 NOTE — Care Management Obs Status (Signed)
[place] NOTIFICATION   Patient Details  Name: [name] MRN: [id number] Date of Birth: 12-19-1931   Medicare Observation Status Notification Given:  Yes    Bethena Roys, RN 02/02/2017, 1:15 PM

## 2017-02-02 NOTE — Progress Notes (Signed)
PT Cancellation Note  Patient Details Name: [name] MRN: [id number] DOB: 24-May-1932   Cancelled Treatment:    Reason Eval/Treat Not Completed: PT screened, no needs identified, will sign off; patient reports up walking in hallway with family multiple times a day.  Feels no current needs for PT.  She is from [place] and plans to return.   Please re-order PT if needs arise.  Thanks   Reginia Naas 02/02/2017, 3:21 PM  [place], [place] 02/02/2017

## 2017-02-03 ENCOUNTER — Encounter (HOSPITAL_COMMUNITY): Payer: Self-pay | Admitting: Interventional Cardiology

## 2017-02-03 DIAGNOSIS — I214 Non-ST elevation (NSTEMI) myocardial infarction: Principal | ICD-10-CM

## 2017-02-03 DIAGNOSIS — Z9889 Other specified postprocedural states: Secondary | ICD-10-CM

## 2017-02-03 DIAGNOSIS — I25110 Atherosclerotic heart disease of native coronary artery with unstable angina pectoris: Secondary | ICD-10-CM

## 2017-02-03 LAB — CBC
HEMATOCRIT: 25.8 % — AB (ref 36.0–46.0)
Hemoglobin: 8.7 g/dL — ABNORMAL LOW (ref 12.0–15.0)
MCH: 33 pg (ref 26.0–34.0)
MCHC: 33.7 g/dL (ref 30.0–36.0)
MCV: 97.7 fL (ref 78.0–100.0)
Platelets: 207 10*3/uL (ref 150–400)
RBC: 2.64 MIL/uL — ABNORMAL LOW (ref 3.87–5.11)
RDW: 14.6 % (ref 11.5–15.5)
WBC: 4.1 10*3/uL (ref 4.0–10.5)

## 2017-02-03 LAB — BASIC METABOLIC PANEL
ANION GAP: 7 (ref 5–15)
BUN: 17 mg/dL (ref 6–20)
CO2: 23 mmol/L (ref 22–32)
Calcium: 8.5 mg/dL — ABNORMAL LOW (ref 8.9–10.3)
Chloride: 106 mmol/L (ref 101–111)
Creatinine, Ser: 0.79 mg/dL (ref 0.44–1.00)
GFR calc Af Amer: >60 mL/min (ref >60)
Glucose, Bld: 95 mg/dL (ref 65–99)
POTASSIUM: 3.6 mmol/L (ref 3.5–5.1)
SODIUM: 136 mmol/L (ref 135–145)

## 2017-02-03 MED ORDER — TRAZODONE HCL 50 MG PO TABS
50.0000 mg | ORAL_TABLET | Freq: Once | ORAL | Status: AC
  Administered 2017-02-03: 50 mg via ORAL
  Filled 2017-02-03: qty 1

## 2017-02-03 MED ORDER — POLYSACCHARIDE IRON COMPLEX 150 MG PO CAPS: 150.0000 mg | ORAL_CAPSULE | Freq: Two times a day (BID) | ORAL | Status: DC

## 2017-02-03 MED ORDER — TICAGRELOR 90 MG PO TABS: 90.0000 mg | ORAL_TABLET | Freq: Two times a day (BID) | ORAL | 10 refills | Status: DC

## 2017-02-03 MED ORDER — TRAMADOL HCL 50 MG PO TABS
50.0000 mg | ORAL_TABLET | Freq: Four times a day (QID) | ORAL | Status: DC | PRN
Start: 2017-02-03 — End: 2017-02-05
  Administered 2017-02-04: 22:00:00 50 mg via ORAL
  Filled 2017-02-03: qty 1

## 2017-02-03 MED ORDER — DOCUSATE SODIUM 100 MG PO CAPS: 100.0000 mg | ORAL_CAPSULE | Freq: Every day | ORAL | 2 refills | Status: AC | PRN

## 2017-02-03 MED ORDER — ANGIOPLASTY BOOK
Freq: Once | Status: DC
  Filled 2017-02-03: qty 1

## 2017-02-03 MED ORDER — ATORVASTATIN CALCIUM 80 MG PO TABS: 80.0000 mg | ORAL_TABLET | Freq: Every day | ORAL | 5 refills | Status: DC

## 2017-02-03 MED ORDER — ENSURE ENLIVE PO LIQD
237.0000 mL | Freq: Two times a day (BID) | ORAL | Status: DC
  Administered 2017-02-03 – 2017-02-04 (×4): 237 mL via ORAL
  Filled 2017-02-03 (×8): qty 237

## 2017-02-03 MED ORDER — SODIUM CHLORIDE 0.9 % IV BOLUS (SEPSIS)
250.0000 mL | Freq: Once | INTRAVENOUS | Status: AC
  Administered 2017-02-03: 250 mL via INTRAVENOUS

## 2017-02-03 MED ORDER — TICAGRELOR 90 MG PO TABS: 90.0000 mg | ORAL_TABLET | Freq: Two times a day (BID) | ORAL | 0 refills | Status: DC

## 2017-02-03 MED ORDER — NITROGLYCERIN 0.4 MG SL SUBL: 0.4000 mg | SUBLINGUAL_TABLET | SUBLINGUAL | 2 refills | Status: AC | PRN

## 2017-02-03 NOTE — Progress Notes (Signed)
1945 Deflated 2cc's from R radial band . Immediate swelling and bleeding from site. Swelling extended proximally to site approx 20 cm. Radial and ulnar pulses present +2. Cath lab staff present, readjusted TR band and inflated to 15cc.  Bleeding stopped. Coban applied to anterior forearm.  Elevated and ice pack applied. Patient c/o of pain but tolerable at this time.  Will continue to monitor.  Dr. Aundra Dubin notified of hematoma. Will continue to monitor the site.  Site is marked.

## 2017-02-03 NOTE — Progress Notes (Signed)
CARDIAC REHAB PHASE I   PRE:  Rate/Rhythm: 28 SR  BP:  Sitting: 124/43        SaO2: 100 RA  MODE:  Ambulation: 500 ft   POST:  Rate/Rhythm: 90 SR  BP:  Sitting: 173/36         SaO2: 100 RA  Pt ambulated 500 ft on RA, handheld assist, mildly unsteady gait at times, tolerated fairly well.  Pt c/o  "feeling like my heart is beating real fast," denies any other complaints, declined rest stop. VSS. Completed PCI/stent education.  Reviewed risk factors, stent book, anti-platelet therapy, stent card, activity restrictions, ntg, exercise, heart healthy diet and phase 2 cardiac rehab. Pt verbalized understanding, receptive to education. Pt agrees to phase 2 cardiac rehab referral, will send to [facility] per pt request. Pt to see case manager regarding brilinta prior to discharge. Pt to recliner after walk, call bell within reach.     [phone] Lenna Sciara, RN, BSN 02/03/2017 9:02 AM

## 2017-02-03 NOTE — Care Management Note (Addendum)
Case Management Note  Patient Details  Name: [name] MRN: [id number] Date of Birth: 1931/09/06  Subjective/Objective:    From [facility], s/p coronary stent intervention, will be on brilinta, NCM awaiting benefit check.  [facility] in stock, NCM gave her a 30 day savings card.  NCM informed CSW that patient is from ALF and she would like to go to the rehab part of the ALF.  Co pay for the Brilinta is 45.00, no prior auth needed.               Action/Plan: NCM will follow for dc needs.  Expected Discharge Date:                  Expected Discharge Plan:  Assisted Living / Rest Home  In-House Referral:  Clinical Social Work  Discharge planning Services  CM Consult  Post Acute Care Choice:    Choice offered to:     DME Arranged:    DME Agency:     HH Arranged:    HH Agency:     Status of Service:  Completed, signed off  If discussed at [employer], dates discussed:    Additional Comments:  Zenon Mayo, RN 02/03/2017, 10:12 AM

## 2017-02-03 NOTE — Consult Note (Signed)
[facility] CM Primary Care Navigator  02/03/2017  [name] April 29, 1932 [id number]   Met with patient, husband and children at the bedsideto identify possible discharge needs. Patient resides at [facility] (ALF) in [place]. Patient reports that she had "right side shoulder pain" that radiated to her arm and "funny feelings to chest" which had led to this admission.   Patient endorses Dr. Antony Salmon and Towanda Octave, NP at [facility] as her primary care providers.   Patient shared [facility] in [place] to obtain medications without difficulty.   According to patient, facility staff manage and dispense her medications.  Patient reports that facility has been providingtransportation to herappointments.  Facility staff provides assistance with her needs when called to room.  Anticipated discharge plan is to go back to [facility] but to the skilled side for rehabilitation before returning back to assisted living side per patient.  Patientreports being seen and followed up by facility physician. Explained to patient and family regarding[facility] CM services available for health management but patientdenies any current needs or concerns at this point.  Patientunderstandsto seek referral to [facility] care management if deemed necessaryfor services in the future.  Patient mentioned awaiting to talk with Inpatient social worker Lorriane Shire) to help arrange her discharge to [facility] (skilled side).    For questions, please contact:  Dannielle Huh, BSN, RN- [facility] Primary Care Navigator  Telephone: [phone] [place]

## 2017-02-03 NOTE — Progress Notes (Signed)
OT Cancellation Note  Patient Details Name: [name] MRN: [id number] DOB: 05-08-1932   Cancelled Treatment:    Reason Eval/Treat Not Completed: OT screened, no needs identified, will sign off. Per MD, pt does not need.  Malka So 02/03/2017, 9:08 AM  02/03/2017 Nestor Lewandowsky, OTR/L Pager: [phone]

## 2017-02-03 NOTE — Clinical Social Work Note (Signed)
[employer] contacted this morning and spoke with Santiago Glad regarding patient and d/c to SNF. Per Santiago Glad, PT/OT evals will be needed and she was informed that orders had been placed for patient to be seen. CSW talked again with Santiago Glad at 4:16 and informed her that PT and OT had seen patient (PT eval was already in [place] and Santiago Glad advised that OT was working on her note. Santiago Glad reviewed PT note and indicated that the medical director would be involved in reviewing information and she would follow-up  CSW visited with family again and provided update. CSW will hear from [employer] on Wednesday morning after 10 am per Santiago Glad.  Silvia Markuson Givens, MSW, LCSW Licensed Clinical Social Worker [place] [phone]

## 2017-02-03 NOTE — Clinical Social Work Note (Signed)
Clinical Social Work Assessment  Patient Details  Name: [name] MRN: [id number] Date of Birth: Feb 12, 1932  Date of referral:  02/03/17               Reason for consult:  Facility Placement (Patient from ALF, requesting SNF placement)                Permission sought to share information with:  Family Supports Permission granted to share information::  Yes, Verbal Permission Granted  Name::     Mr. [name], and [name] and [name]::     Relationship::  Husband; son and daughter-in-law  Contact Information:  Husband - [phone] (h), [phone] (mobile) and son [name] - [phone] (home), [phone] (mobile); [name] [phone] (mobile)    Housing/Transportation Living arrangements for the past 2 months:  [facility]) Source of Information:  Patient, Adult Children, Spouse Patient Interpreter Needed:  None Criminal Activity/Legal Involvement Pertinent to Current Situation/Hospitalization:  No - Comment as needed Significant Relationships:  Adult Children, Spouse, Other Family Members Lives with:  Spouse ([facility]) Do you feel safe going back to the place where you live?  No Need for family participation in patient care:  Yes (Comment)  Care giving concerns: Patient and family concerns about patient not getting all the care she needs at the assisted living and requested that patient go to [facility] for [place] rehab.  Social Worker assessment / plan:  CSW talked at the bedside with [name] and her husband, son and daughter-in-law regarding patient's discharge disposition. Patient was sitting up in a chair and was alert, oriented, pleasant and willing to talk with CSW. [name] indicated that she is having trouble with her arm and she will be limited in what she can do due to not being able to fully use her arm. Patient is a resident at [facility] and is requesting SNF placement for ST  rehab. Patient and family advised that insurance authorization needed as well as patient being evaluated by PT and OT (both had signed off), and patient/family expressed understanding.  CSW talked with nurse, who contacted MD and PT/OT consults put in and PT/OT paged. Contact made with occupational therapist regarding patient and she indicated that patient will be seen and she would contact PT also.  Employment status:  Retired Forensic scientist:  [employer]) PT Recommendations:  [place] / Referral to community resources:  [place] (SNF Information not needed or requested as patient from [place])  Patient/Family's Response to care:  No concerns expressed by patient/family regarding care during hospitalization.  Patient/Family's Understanding of and Emotional Response to Diagnosis, Current Treatment, and Prognosis: Not discussed.  Emotional Assessment Appearance:  Appears stated age Attitude/Demeanor/Rapport:  Other (Appropriate) Affect (typically observed):  Appropriate, Pleasant Orientation:  Oriented to Self, Oriented to Place, Oriented to  Time, Oriented to Situation Alcohol / Substance use:  Tobacco Use, Alcohol Use, Illicit Drugs (Patient reported that she has never smoked, drinks occassionally and does not use illicit drugs) Psych involvement (Current and /or in the community):  No (Comment)  Discharge Needs  Concerns to be addressed:  Discharge Planning Concerns Readmission within the last 30 days:  No Current discharge risk:  None Barriers to Discharge:  [place], [name], [place] 02/03/2017, 5:28 PM

## 2017-02-03 NOTE — Progress Notes (Signed)
Progress Note  Patient Name: [name] Date of Encounter: 02/03/2017  Primary Cardiologist: Dr. Percival Spanish   Subjective   Pt denies any chest pain. No dyspnea. Right forearm is sore post cath w/ mild hematoma but palpable radial pulse.   Inpatient Medications    Scheduled Meds: . amLODipine  2.5 mg Oral Daily  . angioplasty book   Does not apply Once  . aspirin EC  81 mg Oral Daily  . atorvastatin  80 mg Oral q1800  . calcium-vitamin D  1 tablet Oral Q breakfast  . docusate sodium  100 mg Oral Daily  . donepezil  5 mg Oral QHS  . famotidine  40 mg Oral BID  . heparin  5,000 Units Subcutaneous Q8H  . hydrALAZINE  25 mg Oral BID  . iron polysaccharides  150 mg Oral Daily  . isosorbide mononitrate  60 mg Oral Daily  . loratadine  10 mg Oral q1800  . memantine  10 mg Oral BID  . mirabegron ER  25 mg Oral Daily  . montelukast  10 mg Oral QHS  . multivitamin with minerals  1 tablet Oral Daily  . ramelteon  8 mg Oral QHS  . rosuvastatin  5 mg Oral q1800  . sertraline  25 mg Oral Daily  . sertraline  50 mg Oral QPM  . sodium chloride flush  3 mL Intravenous Q12H  . ticagrelor  90 mg Oral BID   Continuous Infusions: . sodium chloride     PRN Meds: sodium chloride, acetaminophen, gi cocktail, nitroGLYCERIN, ondansetron (ZOFRAN) IV, sodium chloride flush, traMADol   Vital Signs    Vitals:   02/03/17 0100 02/03/17 0130 02/03/17 0200 02/03/17 0530  BP: (!) 134/46 (!) 138/49 (!) 130/46 (!) 140/50  Pulse: 71 76 79 72  Resp: 13 14 17 13   Temp:   98.1 F (36.7 C) 97.6 F (36.4 C)  TempSrc:   Oral Oral  SpO2: 97% 97% 98% 96%  Weight:   97 lb (44 kg)   Height:        Intake/Output Summary (Last 24 hours) at 02/03/17 0750 Last data filed at 02/03/17 0600  Gross per 24 hour  Intake           623.45 ml  Output              600 ml  Net            23.45 ml   Filed Weights   01/31/17 1020 02/01/17 0552 02/03/17 0200  Weight: 97 lb 6.4 oz (44.2 kg) 99 lb (44.9 kg)  97 lb (44 kg)    Telemetry    NSR - Personally Reviewed  ECG    NSR - Personally Reviewed  Physical Exam   GEN: No acute distress.  Elderly  Neck: No JVD Cardiac: RRR, no murmurs, rubs, or gallops.  Respiratory: Clear to auscultation bilaterally. GI: Soft, nontender, non-distended  MS: No edema; right forearm hematoma, soft, slightly tender, palpable right radial pulse Neuro:  Nonfocal  Psych: Normal affect   Labs    Chemistry Recent Labs Lab 01/31/17 0623 01/31/17 0930 02/01/17 0038 02/03/17 0240  NA 136  --  137 136  K 3.3*  --  4.1 3.6  CL 101  --  104 106  CO2 25  --  25 23  GLUCOSE 93  --  99 95  BUN 18  --  25* 17  CREATININE 0.79  --  0.85 0.79  CALCIUM 9.1  --  8.8* 8.5*  PROT  --  8.0  --   --   ALBUMIN  --  3.5  --   --   AST  --  36  --   --   ALT  --  20  --   --   ALKPHOS  --  30*  --   --   BILITOT  --  0.4  --   --   GFRNONAA >60  --  >60 >60  GFRAA >60  --  >60 >60  ANIONGAP 10  --  8 7     Hematology Recent Labs Lab 02/01/17 0038 02/02/17 0301 02/03/17 0240  WBC 4.7 4.5 4.1  RBC 2.91* 2.88* 2.64*  HGB 9.6* 9.5* 8.7*  HCT 28.5* 28.3* 25.8*  MCV 97.9 98.3 97.7  MCH 33.0 33.0 33.0  MCHC 33.7 33.6 33.7  RDW 14.3 14.4 14.6  PLT 242 224 207    Cardiac Enzymes Recent Labs Lab 01/31/17 1050 01/31/17 1756 02/01/17 0038  TROPONINI 0.06* 0.05* 0.03*    Recent Labs Lab 01/31/17 0628  TROPIPOC 0.02     BNPNo results for input(s): BNP, PROBNP in the last 168 hours.   DDimer No results for input(s): DDIMER in the last 168 hours.   Radiology    Nm Myocar Multi W/spect W/wall Motion / Ef  Result Date: 02/02/2017 CLINICAL DATA:  Chest pain EXAM: MYOCARDIAL IMAGING WITH SPECT (REST) TECHNIQUE: Standard myocardial SPECT imaging was performed after resting intravenous injection of 10 millicuries RC-78L tetrofosmin. COMPARISON:  None. FINDINGS: Perfusion: No decreased activity in the left ventricle on rest imaging only. IMPRESSION:  1. No defect noted on rest only imaging. 2. Non invasive risk stratification*: Low Electronically Signed   By: Rolm Baptise M.D.   On: 02/02/2017 13:11    Cardiac Studies   Procedures   CORONARY STENT INTERVENTION  LEFT HEART CATH AND CORS/GRAFTS ANGIOGRAPHY  Conclusion    Presentation with acute coronary syndrome, with symptoms that are atypical.  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  Widely patent left main  De novo 90% stenosis in the proximal to mid circumflex since the last catheterization in 2014.Marland Kitchen  Chronic occlusion of the mid right coronary due to in-stent restenosis  Patent LAD with 30-40% diffuse ISR in a previously placed stent. 75% ostial stenosis of the diagonal jailed by the stent.  Patent saphenous vein graft to the distal right coronary. The proximal third of the vein graft contains up to 50% diffuse obstructive disease. Unchanged from 2014 angiogram.  Successful drug-eluting stent procedure on the 90% circumflex stenosis which was reduced to 0% with TIMI grade 3 flow using a 2.25 x 12 Xience Sierra DES postdilated to 2.5 mm in diameter.      Patient Profile    83 y.o.femalewith a hx of GERD, ASCAD s/p remote AWMI in 2007 with BMS to RCA and LAD and subsequent CABG for restenosiswho is being seen for the evaluation of SOB and shoulder pain. Serial troponins minimally elevated with flat trend.   Assessment & Plan    1. Unstable Angina/CAD: s/p remote MI and PCI of the RCA and LAD with subsequent CABG for restenosis. LHC 02/02/17 for unstable angina showed De novo 90% stenosis in the proximal to mid circumflex since the last catheterization in 2014, successfully treated with PCI utilizing a DES. Other PCI data outlined above in cath report.  EF normal at 55-65%. No RWMA. [name] is stable w/o recurrent CP or dyspnea. Plan is for DAPT w/  ASA and Brilinta for a minimum of 12 months, statin therapy with Lipitor 80 mg. D/c Crestor. Continue Imdur. Consider addition  of BB, if HR allows. Ambulate with cardiac rehab today.   2. HTN: Controlled on current regimen, however pt not currently on a BB. Pt with CAD. HR is stable on telemetry. Try adding low dose metoprolol 12.5 mg BID.  3. HLD: LDL is at goal at 69 mg/dL. Continue with statin therapy, Lipitor 80 mg.   4. Anemia: Drop in Hgb since 01/31/17 from 10.3>>9.5>>8.7. Pt reports h/o IDA and takes Fe supplementation at home. Pt now on DAPT with ASA + Brilinta for DES. [name] will need f/u CBC in the office early next week to recheck Hgb.   Dispo: likely d/c home today if ambulating ok with cardia rehab. MD to follow with further recommendations.    Signed, Lyda Jester, PA-C  02/03/2017, 7:50 AM    I have seen and examined the patient along with Lyda Jester, PA-C .  I have reviewed the chart, notes and new data.  I agree with PA's note.  Key new complaints: no angina, right radial site is less sore. Walked w nurse. Key examination changes: ecchymosis, small hematoma R forearm, excellent radial pulse. Key new findings / data: small drop in Hgb noted.   PLAN:  Discussed mandatory DAPT. If bleeding/anemia worsens, consider Brilinta monotherapy, especially after the first month. Brilinta needs to be continued for at least 6 months. Will monitor H/H frequently and bring back to office early. Ready for DC, but [name] is elderly, frail and deconditioned. Would benefit from rehab at [facility].  Sanda Klein, MD, [place] [phone] 02/03/2017, 9:16 AM

## 2017-02-03 NOTE — Progress Notes (Signed)
   Subjective: Patient seen in Cardiac Rehab. Cardiology was in the room and stated that they will being taking over as the primary team after the patient's Cardiac cath and DES placement. No further management requests of the medicine team at this time.   Objective:  Vital signs in last 24 hours: Vitals:   02/03/17 0130 02/03/17 0200 02/03/17 0530 02/03/17 0815  BP: (!) 138/49 (!) 130/46 (!) 140/50 (!) 124/43  Pulse: 76 79 72 71  Resp: 14 17 13 10   Temp:  98.1 F (36.7 C) 97.6 F (36.4 C) 97.7 F (36.5 C)  TempSrc:  Oral Oral Oral  SpO2: 97% 98% 96% 100%  Weight:  97 lb (44 kg)    Height:       General: Laying in bed comfortably, NAD HEENT: [place]/AT, EOMI, no scleral icterus, Cardiac: RRR, No R/M/G appreciated Pulm: normal effort, CTAB Abd: soft, non tender, non distended, BS normal Ext: extremities well perfused, no peripheral edema Neuro: alert and oriented X3, cranial nerves II-XII grossly intact   Assessment/Plan:  Principal Problem:   Non-ST elevation (NSTEMI) myocardial infarction [facility]) Active Problems:   Hypertension   Atherosclerosis of coronary artery bypass graft   Chest pain with moderate risk for cardiac etiology   SOB (shortness of breath)   NSTEMI (non-ST elevated myocardial infarction) (HCC)  Chest pain 2/2 to unstable angina and CAD s/p LH cardiac catheterization showed de novo 90% stenosis in the proximal to mid circumflex since the last catheterization in 2014, successfully treated with PCI utilizing a DES.  -Per cardiology will being DAPT with ASA and Brilinta for minimum of 12 months, also high dose statin therapy with Lipitor 80 mg   Dispo: Anticipated discharge today per Cardiology.   Melanee Spry, MD 02/03/2017, 10:15 AM Pager: [phone]

## 2017-02-03 NOTE — Evaluation (Signed)
Occupational Therapy Evaluation Patient Details Name: [name] MRN: [id number] DOB: [birth year] Today's Date: 02/03/2017    History of Present Illness Admitted with Unstable Angina/CAD: s/p remote MI and PCI of the RCA and LAD with subsequent CABG for restenosis. LHC 02/02/17 for unstable angina showed De novo 90% stenosis in the proximal to mid circumflex since the last catheterization in 2014, successfully treated with PCI utilizing a DES. Hematoma RUE post cath; has a pertinent past medical history of Anxiety; Arthritis; Asthma;  CABG;  Hypertension; Incontinent of urine;  Mild chronic anemia; Multiple myeloma (HCC); NSTEMI (non-ST elevated myocardial infarction) ([place]) (01/31/2017); Osteopenia; Overactive bladder; Panic disorder; Raynauds syndrome; Spinal stenosis of lumbar region; and Urine test positive for microalbuminuria (12/2011).   Clinical Impression   At her baseline, pt is independent in self care and ambulation. She relies on the ALF staff for IADL. Pt presents with pain and limited use of R UE interfering with ability to perform ADL. She demonstrates mild unsteadiness with ambulation and fatigues easily. Recommending short SNF stay prior to return to ALF. Pt is aware she may use her R UE for self care, but no heavy work. Will follow acutely.    Follow Up Recommendations  SNF    Equipment Recommendations       Recommendations for Other Services       Precautions / Restrictions Precautions Precautions: Fall Precaution Comments: watch for orthostatic hypotension Restrictions Weight Bearing Restrictions: No Other Position/Activity Restrictions: limit heavy use of R UE/wrist      Mobility Bed Mobility               General bed mobility comments: in chair upon arrival  Transfers Overall transfer level: Needs assistance Equipment used: 1 person hand held assist Transfers: Sit to/from Stand Sit to Stand: Min assist         General transfer comment:  steadying assist upon rising, reaching for surfaces     Balance Overall balance assessment: Needs assistance   Sitting balance-Leahy Scale: Good       Standing balance-Leahy Scale: Fair                             ADL either performed or assessed with clinical judgement   ADL Overall ADL's : Needs assistance/impaired Eating/Feeding: Set up;Sitting Eating/Feeding Details (indicate cue type and reason): assist to open packages, cut food Grooming: Min guard;Standing;Wash/dry hands   Upper Body Bathing: Minimal assistance;Sitting   Lower Body Bathing: Minimal assistance;Sit to/from stand   Upper Body Dressing : Minimal assistance;Sitting   Lower Body Dressing: Moderate assistance;Maximal assistance;Sit to/from stand Lower Body Dressing Details (indicate cue type and reason): max assist for compression hose Toilet Transfer: Minimal assistance;Ambulation Toilet Transfer Details (indicate cue type and reason): pt unsteady Toileting- Clothing Manipulation and Hygiene: Moderate assistance;Sit to/from stand Toileting - Clothing Manipulation Details (indicate cue type and reason): assist to pull up/down depends     Functional mobility during ADLs: Minimal assistance       Vision Baseline Vision/History: Wears glasses Patient Visual Report: No change from baseline       Perception     Praxis      Pertinent Vitals/Pain Pain Assessment: Faces Faces Pain Scale: Hurts a little bit Pain Location: R hand Pain Descriptors / Indicators: Sore Pain Intervention(s): Monitored during session     Hand Dominance Right   Extremity/Trunk Assessment Upper Extremity Assessment Upper Extremity Assessment: RUE deficits/detail RUE Deficits / Details:  Noted hematoma/ecchymosis R hand; sore, gentle finger extension and thumb opposition intact; elbow and shoulder WFL with history of shoulder arthritis RUE Coordination: decreased fine motor   Lower Extremity Assessment Lower  Extremity Assessment: Defer to PT evaluation       Communication Communication Communication: No difficulties   Cognition Arousal/Alertness: Awake/alert Behavior During Therapy: WFL for tasks assessed/performed Overall Cognitive Status: Within Functional Limits for tasks assessed                                     General Comments  Educated pt and family in positioning of RUE    Exercises Hand Exercises Opposition: AROM;Right   Shoulder Instructions      Home Living Family/patient expects to be discharged to:: Assisted living                             Home Equipment: None   Additional Comments: Reports she typically walks to dining hall/restaurants at [place]      Prior Functioning/Environment Level of Independence: Independent        Comments: pt typically wears depends and compression hose        OT Problem List: Decreased strength;Decreased activity tolerance;Impaired balance (sitting and/or standing);Decreased coordination;Decreased knowledge of use of DME or AE;Impaired UE functional use;Pain;Increased edema      OT Treatment/Interventions: Self-care/ADL training;DME and/or AE instruction;Therapeutic activities;Patient/family education;Balance training    OT Goals(Current goals can be found in the care plan section) Acute Rehab OT Goals Patient Stated Goal: feel better OT Goal Formulation: With patient Time For Goal Achievement: 02/17/17 Potential to Achieve Goals: Good ADL Goals Pt Will Perform Eating: with modified independence;sitting Pt Will Perform Grooming: with modified independence;standing Pt Will Perform Lower Body Bathing: with modified independence;sit to/from stand Pt Will Perform Lower Body Dressing: with modified independence;sit to/from stand Pt Will Transfer to Toilet: with modified independence;ambulating;regular height toilet Pt Will Perform Toileting - Clothing Manipulation and hygiene: with  modified independence;sit to/from stand  OT Frequency: Min 2X/week   Barriers to D/C: Decreased caregiver support          Co-evaluation              AM-PAC PT "6 Clicks" Daily Activity     Outcome Measure Help from another person eating meals?: A Little Help from another person taking care of personal grooming?: A Little Help from another person toileting, which includes using toliet, bedpan, or urinal?: A Lot Help from another person bathing (including washing, rinsing, drying)?: A Little Help from another person to put on and taking off regular upper body clothing?: None Help from another person to put on and taking off regular lower body clothing?: A Lot 6 Click Score: 17   End of Session Equipment Utilized During Treatment: Gait belt  Activity Tolerance: Patient tolerated treatment well Patient left: in chair;with call bell/phone within reach;with family/visitor present  OT Visit Diagnosis: Unsteadiness on feet (R26.81);Pain Pain - Right/Left: Right Pain - part of body: Arm                Time: 6144-3154 OT Time Calculation (min): 22 min Charges:  OT General Charges $OT Visit: 1 Visit OT Evaluation $OT Eval Moderate Complexity: 1 Mod G-Codes:     03/03/17 Nestor Lewandowsky, OTR/L Pager: [phone] Werner Lean, [place] 03/03/17, 4:36 PM

## 2017-02-03 NOTE — Evaluation (Signed)
Physical Therapy Evaluation Patient Details Name: [name] MRN: [id number] DOB: Oct 20, 1931 Today's Date: 02/03/2017   History of Present Illness  Admitted with Unstable Angina/CAD: s/p remote MI and PCI of the RCA and LAD with subsequent CABG for restenosis. LHC 02/02/17 for unstable angina showed De novo 90% stenosis in the proximal to mid circumflex since the last catheterization in 2014, successfully treated with PCI utilizing a DES. Hematoma RUE post cath; has a pertinent past medical history of Anxiety; Arthritis; Asthma;  CABG;  Hypertension; Incontinent of urine;  Mild chronic anemia; Multiple myeloma (HCC); NSTEMI (non-ST elevated myocardial infarction) ([name]) (01/31/2017); Osteopenia; Overactive bladder; Panic disorder; Raynauds syndrome; Spinal stenosis of lumbar region; and Urine test positive for microalbuminuria (12/2011).  Clinical Impression   Pt admitted with above diagnosis. Pt currently with functional limitations due to the deficits listed below (see PT Problem List). [name] is from ALF at [facility]; she tells me she is mostly independent, and gets assist with IADLs, performs ADLs independently; She presents today with decr activity tolerance, orthostatic hypotension;  Pt will benefit from skilled PT to increase their independence and safety with mobility to allow discharge to the venue listed below.      02/03/17 1421  Vital Signs  Patient Position (if appropriate) Orthostatic Vitals  Orthostatic Lying   BP- Lying 158/43  Pulse- Lying 78  Orthostatic Sitting  BP- Sitting 141/43  Pulse- Sitting 78  Orthostatic Standing at 0 minutes  BP- Standing at 0 minutes 101/43  Pulse- Standing at 0 minutes 93 (Symptomatic for dizziness and had to sit)   Discussed vitals with [name], [name]     Follow Up Recommendations SNF    Equipment Recommendations  Other (comment) (Consider cane, TBD at [facility])    Recommendations for Other Services OT consult;Other (comment) (SW  for SNF at [facility])     Precautions / Restrictions Precautions Precautions: Fall Precaution Comments: watch for orthostatic hypotension      Mobility  Bed Mobility               General bed mobility comments: in chair upon arrival  Transfers Overall transfer level: Needs assistance Equipment used: None;1 person hand held assist Transfers: Sit to/from Stand Sit to Stand: Min assist         General transfer comment: Min assist to steady at initial stand; cues to fully extend hips and knees; cues also to self-monitor for activity tolerance  Ambulation/Gait Ambulation/Gait assistance: Min guard Ambulation Distance (Feet):  (Hallway ambulation) Assistive device: 1 person hand held assist Gait Pattern/deviations: Step-through pattern;Narrow base of support;Decreased step length - right;Decreased step length - left;Decreased stride length   Gait velocity interpretation: Below normal speed for age/gender General Gait Details: Cues to self-monitor for activity tolerance throughout session; one seated rest break early in walk; later in walk, noted slight R lean, needing to reach out for bilateral support, incr pallor; used wheelchair to return to room  Stairs            Wheelchair Mobility    Modified Rankin (Stroke Patients Only)       Balance Overall balance assessment: Needs assistance           Standing balance-Leahy Scale: Fair                               Pertinent Vitals/Pain Pain Assessment: Faces Faces Pain Scale: Hurts a little bit Pain Location: R hand  Pain Descriptors / Indicators: Sore Pain Intervention(s): Monitored during session;Other (comment) (encouraged gentle AROM)    Home Living Family/patient expects to be discharged to:: Assisted living               Home Equipment: None Additional Comments: Reports she typically walks to dining hall/restaurants at [employer]    Prior Function Level of  Independence: Independent               Hand Dominance   Dominant Hand: Right    Extremity/Trunk Assessment   Upper Extremity Assessment Upper Extremity Assessment: RUE deficits/detail RUE Deficits / Details: Noted hematoma/ecchymosis R hand; sore, gentle finger extension and thumb opposition intact; elbow and shoulder WFL    Lower Extremity Assessment Lower Extremity Assessment: Generalized weakness       Communication      Cognition Arousal/Alertness: Awake/alert Behavior During Therapy: WFL for tasks assessed/performed Overall Cognitive Status: Within Functional Limits for tasks assessed                                        General Comments General comments (skin integrity, edema, etc.): Educated pt and family in positioning of RUE    Exercises Hand Exercises Opposition: AROM;Right   Assessment/Plan    PT Assessment Patient needs continued PT services  PT Problem List Decreased strength;Decreased range of motion;Decreased activity tolerance;Decreased balance;Decreased mobility;Decreased knowledge of use of DME;Decreased knowledge of precautions;Pain       PT Treatment Interventions DME instruction;Gait training;Stair training;Functional mobility training;Therapeutic activities;Therapeutic exercise;Balance training;Patient/family education    PT Goals (Current goals can be found in the Care Plan section)  Acute Rehab PT Goals Patient Stated Goal: feel better PT Goal Formulation: With patient Time For Goal Achievement: 02/17/17 Potential to Achieve Goals: Good    Frequency Min 3X/week   Barriers to discharge        Co-evaluation               AM-PAC PT "6 Clicks" Daily Activity  Outcome Measure Difficulty turning over in bed (including adjusting bedclothes, sheets and blankets)?: None Difficulty moving from lying on back to sitting on the side of the bed? : A Little Difficulty sitting down on and standing up from a chair  with arms (e.g., wheelchair, bedside commode, etc,.)?: A Lot Help needed moving to and from a bed to chair (including a wheelchair)?: A Little Help needed walking in hospital room?: A Little Help needed climbing 3-5 steps with a railing? : A Little 6 Click Score: 18    End of Session   Activity Tolerance: Patient limited by fatigue;Other (comment) (Orthostatic hypotension) Patient left: in chair;with call bell/phone within reach Nurse Communication: Mobility status;Other (comment) (orthostatics) PT Visit Diagnosis: Other abnormalities of gait and mobility (R26.89);Dizziness and giddiness (R42)    Time: 1341-1420 PT Time Calculation (min) (ACUTE ONLY): 39 min   Charges:   PT Evaluation $PT Eval Moderate Complexity: 1 Mod PT Treatments $Gait Training: 8-22 mins $Therapeutic Activity: 8-22 mins   PT G Codes:        Roney Marion, PT  Acute Rehabilitation Services Pager [phone] Office [place] 02/03/2017, 3:02 PM

## 2017-02-04 ENCOUNTER — Other Ambulatory Visit: Payer: Self-pay | Admitting: Cardiology

## 2017-02-04 MED ORDER — TICAGRELOR 90 MG PO TABS: 90.0000 mg | ORAL_TABLET | Freq: Two times a day (BID) | ORAL | 0 refills | Status: DC

## 2017-02-04 MED ORDER — GI COCKTAIL ~~LOC~~
30.0000 mL | Freq: Once | ORAL | Status: AC
  Administered 2017-02-04: 23:00:00 30 mL via ORAL
  Filled 2017-02-04: qty 30

## 2017-02-04 NOTE — Discharge Summary (Signed)
Discharge Summary    Patient ID: [name],  MRN: [id number], DOB/AGE: 81-Feb-1933 81 y.o.  Admit date: 01/31/2017 Discharge date: 02/05/2017  Primary Care Provider: [place] Primary Cardiologist: Dr. Percival Spanish  Discharge Diagnoses    Principal Problem:   Non-ST elevation (NSTEMI) myocardial infarction [facility]) Active Problems:   Hypertension   Atherosclerosis of coronary artery bypass graft   Chest pain with moderate risk for cardiac etiology   SOB (shortness of breath)   NSTEMI (non-ST elevated myocardial infarction) (HCC)   Allergies Allergies  Allergen Reactions  . Butazolidin [Phenylbutazone] Swelling and Rash  . Cephalosporins Other (See Comments)    unknown  . Codeine Nausea Only  . Levaquin [Levofloxacin] Shortness Of Breath  . Macrodantin Other (See Comments)    Double vision  . Morphine And Related Other (See Comments)    hallucinations  . Trimethoprim Nausea Only  . Cilostazol Other (See Comments)    Stomach upset  . Clindamycin Other (See Comments)    diarrhea  . Dexilant [Dexlansoprazole] Diarrhea  . Erythromycin Nausea And Vomiting    All "mycin" drugs  . Morphine   . Nitrofurantoin     Other reaction(s): Other (See Comments) Double vision Other reaction(s): Other (See Comments) Double vision  . Simvastatin Other (See Comments)    Muscle aches Muscle aches  . Sulfa Antibiotics Other (See Comments)    unknown  . Vesicare [Solifenacin] Other (See Comments)    unknown  . Clindamycin/Lincomycin Other (See Comments)    Upset stomach  . Food Other (See Comments)    No spicy foods or black pepper, raw onions - causes gerd  . Ibuprofen Hives and Rash  . Lincomycin Hcl Other (See Comments)    Upset stomach  . Penicillins Rash    Has patient had a PCN reaction causing immediate rash, facial/tongue/throat swelling, SOB or lightheadedness with hypotension: Yes Has patient had a PCN reaction causing severe rash involving  mucus membranes or skin necrosis: No Has patient had a PCN reaction that required hospitalization No Has patient had a PCN reaction occurring within the last 10 years: No If all of the above answers are "NO", then may proceed with Cephalosporin use.   Lisbeth Ply [Fesoterodine Fumarate Er] Other (See Comments)    unknown  . Voltaren [Diclofenac Sodium] Rash    Diagnostic Studies/Procedures    LEFT HEART CATH AND CORS/GRAFTS ANGIOGRAPHY 02/02/17  Conclusion   Presentation with acute coronary syndrome, with symptoms that are atypical.  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  Widely patent left main  De novo 90% stenosis in the proximal to mid circumflex since the last catheterization in 2014.Marland Kitchen  Chronic occlusion of the mid right coronary due to in-stent restenosis  Patent LAD with 30-40% diffuse ISR in a previously placed stent. 75% ostial stenosis of the diagonal jailed by the stent.  Patent saphenous vein graft to the distal right coronary. The proximal third of the vein graft contains up to 50% diffuse obstructive disease. Unchanged from 2014 angiogram.  Successful drug-eluting stent procedure on the 90% circumflex stenosis which was reduced to 0% with TIMI grade 3 flow using a 2.25 x 12 Xience Sierra DES postdilated to 2.5 mm in diameter.  RECOMMENDATIONS:   Dual antiplatelet therapy for 12 months  Risk factor modification including LDL less than 70.   _____________  Echocardiogram 02/02/17 Study Conclusions  - Left ventricle: The cavity size was normal. Systolic function was   normal. The estimated  ejection fraction was in the range of 60%   to 65%. Wall motion was normal; there were no regional wall   motion abnormalities. Doppler parameters are consistent with   abnormal left ventricular relaxation (grade 1 diastolic   dysfunction). Doppler parameters are consistent with elevated   ventricular end-diastolic filling pressure. - Aortic valve: There was moderate  regurgitation. - Aortic root: The aortic root was normal in size. - Left atrium: The atrium was normal in size. - Right ventricle: The cavity size was normal. Wall thickness was   normal. Systolic function was normal. - Tricuspid valve: There was mild regurgitation. - Pulmonic valve: There was mild regurgitation. - Pulmonary arteries: Systolic pressure was within the normal   range. - Inferior vena cava: The vessel was normal in size. - Pericardium, extracardiac: There was no pericardial effusion.    History of Present Illness     [name] is an 83 y.o. female with a hx of GERD, ASCAD s/p remote AWMI in 2007 with BMS to RCA and LAD and subsequent CABG for restenosiswho is being seen for the evaluation of SOB and shoulder pain. She came to the emergency department on 01/31/17 because of sudden onset chest pressure. She reported that this occurred while she was laying in bed at rest. It felt very similar to previous heart attacks. This sensation radiated to her right shoulder and down her right arm. She is usually fairly active, she is independent in her activities of daily living. She exercises daily. She generally does not feel much chronic angina on her current medication regimen.   Hospital Course     Consultants: PT/OT  EKG no evidence of ST elevations, but T wave inversions in aVL more pronounced than in previous EKG, Troponins were mildly elevated with peak of 0.06. She was taken for nuclear stress test but developed chest pain just prior to initiation of the test. A left heart cath was done on 02/02/17 for unstable angina and showed De novo 90% stenosis in the proximal to mid circumflex since the last catheterization in 2014, successfully treated with PCI utilizing a DES. Other PCI data outlined above in cath report.  EF normal at 55-65%. No RWMA. She is stable w/o recurrent CP or dyspnea. Plan is for DAPT w/ ASA and Brilinta for a minimum of 12 months, statin therapy with Lipitor  80 mg. D/C Crestor. Continue Imdur. Consider addition of BB as outpatient.   Hgb dropped since 01/31/17 from 10.3>>9.5>>8.7. Pt reports h/o IDA and takes Fe supplementation at home. Pt now on DAPT with ASA + Brilinta for DES. She will need f/u CBC in the office early next week to recheck Hgb and follow frequently. If bleeding/anemia worsens, consider Brilinta monotherapy, especially after the first month. Brilinta needs to be continued for at least 6 months.  Pt has been evaluated by PT/OT and the plan is to go to rehab at [facility]. Waited for bed for 2 days. Will drow CBC prior to discharge.    Patient has been seen by Dr. Sallyanne Kuster today and deemed ready for discharge home. All follow up appointments have been scheduled. Discharge medications are listed below.  _____________  Discharge Vitals Blood pressure (!) 132/49, pulse 73, temperature 97.7 F (36.5 C), temperature source Oral, resp. rate 10, height _0  (1.6 m), weight 95 lb (43.1 kg), SpO2 100 %.  Filed Weights   02/01/17 0552 02/03/17 0200 02/04/17 0650  Weight: 99 lb (44.9 kg) 97 lb (44 kg) 95 lb (43.1  kg)   Physical Exam  Constitutional: She appears healthy.  Neck: No JVD present.  Cardiovascular: Normal rate, regular rhythm, S1 normal, S2 normal and normal heart sounds.  Exam reveals no gallop and no friction rub.   No murmur heard. Right wrist with purple bruising up to elbow. No edema. Mild tenderness. Radial pulse present.   Pulmonary/Chest: Breath sounds normal. She has no wheezes. She has no rales.  Abdominal: Soft. Bowel sounds are normal. She exhibits no distension. There is no tenderness.  Musculoskeletal: She exhibits no edema.  Neurological: She is alert and oriented to person, place, and time.  Skin: Skin is warm and dry.   Labs & Radiologic Studies    CBC  Recent Labs  02/03/17 0240  WBC 4.1  HGB 8.7*  HCT 25.8*  MCV 97.7  PLT 710   Basic Metabolic Panel  Recent Labs  02/03/17 0240  NA 136    K 3.6  CL 106  CO2 23  GLUCOSE 95  BUN 17  CREATININE 0.79  CALCIUM 8.5*   _____________  Dg Chest 2 View  Result Date: 01/31/2017 CLINICAL DATA:  83 year old female with sudden onset chest pain this morning. EXAM: CHEST  2 VIEW COMPARISON:  Chest CTA 08/03/2016 and earlier. FINDINGS: Upright AP and lateral views of the chest. Prior CABG. Stable mild cardiomegaly. Other mediastinal contours are within normal limits. Visualized tracheal air column is within normal limits. Stable large lung volumes. No pneumothorax, pulmonary edema, pleural effusion or acute pulmonary opacity. Osteopenia. Stable visualized osseous structures. IMPRESSION: No acute cardiopulmonary abnormality. Electronically Signed   By: Genevie Ann M.D.   On: 01/31/2017 06:55   Nm Myocar Multi W/spect W/wall Motion / Ef  Result Date: 02/02/2017 CLINICAL DATA:  Chest pain EXAM: MYOCARDIAL IMAGING WITH SPECT (REST) TECHNIQUE: Standard myocardial SPECT imaging was performed after resting intravenous injection of 10 millicuries GY-69S tetrofosmin. COMPARISON:  None. FINDINGS: Perfusion: No decreased activity in the left ventricle on rest imaging only. IMPRESSION: 1. No defect noted on rest only imaging. 2. Non invasive risk stratification*: Low Electronically Signed   By: Rolm Baptise M.D.   On: 02/02/2017 13:11   Disposition   Pt is being discharged home today in good condition.  Follow-up Plans & Appointments     Contact information for follow-up providers    Minus Breeding, MD Follow up.   Specialty:  Cardiology Why:  You have an appt with Rosaria Ferries, PA on Wednesday Sept 5th at 11:00 for cardiology hospital follow up.  You will need follow-up blood work done in our office that day (CBC). Contact information: [address] [place] [address] [phone]            Contact information for after-discharge care    Destination    [facility] SN[facility]F Follow up.   Specialty:   [place] information: [place] [phone]                 Discharge Instructions    Amb Referral to Cardiac Rehabilitation    Complete by:  As directed    Diagnosis:  Coronary Stents Comment - to [place]   Diet - low sodium heart healthy    Complete by:  As directed    Diet - low sodium heart healthy    Complete by:  As directed    Discharge instructions    Complete by:  As directed  Recommend you take an over the counter stool softener while taking iron supplements to prevent constipation.   Discharge instructions    Complete by:  As directed    PLEASE REMEMBER TO BRING ALL OF YOUR MEDICATIONS TO EACH OF YOUR FOLLOW-UP OFFICE VISITS.  PLEASE ATTEND ALL SCHEDULED FOLLOW-UP APPOINTMENTS.   Activity: Increase activity slowly as tolerated. You may shower, but no soaking baths (or swimming) for 1 week. No driving for 24 hours. No lifting over 5 lbs for 1 week. No sexual activity for 1 week.   You May Return to Work: in 1 week (if applicable)  Wound Care: You may wash cath site gently with soap and water. Keep cath site clean and dry. If you notice pain, swelling, bleeding or pus at your cath site, please call [phone].   Increase activity slowly    Complete by:  As directed    Increase activity slowly    Complete by:  As directed       Discharge Medications   Current Discharge Medication List    START taking these medications   Details  atorvastatin (LIPITOR) 80 MG tablet Take 1 tablet (80 mg total) by mouth daily at 6 PM. Qty: 30 tablet, Refills: 5    !! docusate sodium (COLACE) 100 MG capsule Take 1 capsule (100 mg total) by mouth daily as needed. Qty: 30 capsule, Refills: 2    !! ticagrelor (BRILINTA) 90 MG TABS tablet Take 1 tablet (90 mg total) by mouth 2 (two) times daily. Qty: 60 tablet, Refills: 0    !! ticagrelor (BRILINTA) 90 MG TABS tablet Take 1 tablet (90 mg total) by mouth 2  (two) times daily. Qty: 60 tablet, Refills: 11     !! - Potential duplicate medications found. Please discuss with provider.    CONTINUE these medications which have CHANGED   Details  iron polysaccharides (NIFEREX) 150 MG capsule Take 1 capsule (150 mg total) by mouth 2 (two) times daily.    nitroGLYCERIN (NITROSTAT) 0.4 MG SL tablet Place 1 tablet (0.4 mg total) under the tongue every 5 (five) minutes as needed for chest pain. Reported on 11/12/2015 Qty: 25 tablet, Refills: 2      CONTINUE these medications which have NOT CHANGED   Details  acetaminophen (TYLENOL) 650 MG CR tablet Take 1,300 mg by mouth 2 (two) times daily.    amLODipine (NORVASC) 5 MG tablet Take 0.5 tablets (2.5 mg total) by mouth daily. Qty: 15 tablet, Refills: 0    aspirin EC 81 MG tablet Take 1 tablet (81 mg total) by mouth daily. Qty: 90 tablet, Refills: 3    calcium-vitamin D (OSCAL WITH D) 500-200 MG-UNIT tablet Take 1 tablet by mouth daily with breakfast.     !! docusate sodium (COLACE) 100 MG capsule Take 100 mg by mouth daily.    donepezil (ARICEPT) 5 MG tablet Take 5 mg by mouth at bedtime.    famotidine (PEPCID) 40 MG tablet Take 40 mg by mouth 2 (two) times daily.     !! hydrALAZINE (APRESOLINE) 25 MG tablet Take 25 mg by mouth 2 (two) times daily.     isosorbide mononitrate (IMDUR) 60 MG 24 hr tablet Take 60 mg by mouth daily.    levocetirizine (XYZAL) 5 MG tablet Take 1 tablet (5 mg total) by mouth every evening. Qty: 30 tablet, Refills: 5    Melatonin 5 MG TABS Take 5 mg by mouth at bedtime.    memantine (NAMENDA) 10 MG tablet Take 10  mg by mouth 2 (two) times daily.    Associated Diagnoses: Multiple myeloma not having achieved remission (HCC)    mirabegron ER (MYRBETRIQ) 25 MG TB24 tablet Take 25 mg by mouth every morning.     montelukast (SINGULAIR) 10 MG tablet Take 10 mg by mouth at bedtime.     Multiple Vitamin (MULITIVITAMIN WITH MINERALS) TABS Take 1 tablet by mouth daily.      !! sertraline (ZOLOFT) 25 MG tablet Take 25 mg by mouth daily.    !! sertraline (ZOLOFT) 50 MG tablet Take 50 mg by mouth every evening.     !! hydrALAZINE (APRESOLINE) 10 MG tablet Take 1 tablet (10 mg total) by mouth as needed. Take if Systolic blood pressure over 180 Qty: 30 tablet, Refills: 3     !! - Potential duplicate medications found. Please discuss with provider.    STOP taking these medications     rosuvastatin (CRESTOR) 5 MG tablet           Outstanding Labs/Studies   Need Hgb in 1 week and followed regularly.   Lipid panel and LFT in 6 weeks.   Duration of Discharge Encounter   Greater than 30 minutes including physician time.  Signed, Leanor Kail, PA-C

## 2017-02-04 NOTE — NC FL2 (Signed)
The patient has been seen in conjunction with the current admission. All aspects of care have been considered and discussed. The patient has been personally interviewed, examined, and all clinical data has been reviewed.   Agree with the current plans.  [place] MEDICAID FL2 LEVEL OF CARE SCREENING TOOL     IDENTIFICATION  Patient Name: COLLETTA SPILLERS Birthdate: 1931-10-03 Sex: female Admission Date (Current Location): 01/31/2017  [facility] and [place] Number:  Herbalist and Address:  [facility]. [facility], [place] [address], [place], [place] [address]      Provider Number: [id number]  Attending Physician Name and Address:  Belva Crome, MD  Relative Name and Phone Number:       Current Level of Care: Hospital Recommended Level of Care: [facility] Prior Approval Number:    Date Approved/Denied:   PASRR Number:    Discharge Plan: SNF    Current Diagnoses: Patient Active Problem List   Diagnosis Date Noted  . NSTEMI (non-ST elevated myocardial infarction) ([place]) 02/02/2017  . SOB (shortness of breath) 09/02/2016  . Dizziness 09/02/2016  . Non-ST elevation (NSTEMI) myocardial infarction ([place]) 11/26/2015  . Polymyalgia rheumatica ([place]) 11/26/2015  . Hypokalemia 11/26/2015  . Mild persistent asthma 11/12/2015  . Coronary artery disease involving autologous artery coronary bypass graft with angina pectoris with documented spasm ([place]) 11/12/2015  . Multiple myeloma in remission ([place]) 11/12/2015  . Current use of beta blocker 11/12/2015  . Gastroesophageal reflux disease without esophagitis 11/12/2015  . Other allergic rhinitis 11/12/2015  . Weight loss, non-intentional 10/03/2015  . Asthma 09/24/2015  . Asthma in adult without complication 33/61/2244  . Seasonal allergic rhinitis due to pollen 09/24/2015  . Cervical radiculopathy 07/25/2015  . Chronic coronary artery disease 07/25/2015  . Coronary artery disease 07/25/2015  .  Disturbance in sleep behavior 07/25/2015  . Elevated CK 07/25/2015  . Elevated creatine kinase level 07/25/2015  . Generalized osteoarthritis of hand 07/25/2015  . Other long term (current) drug therapy 07/25/2015  . Overactive bladder 07/25/2015  . Restless leg 07/25/2015  . Restless leg syndrome 07/25/2015  . Sleep disturbances 07/25/2015  . Idiopathic peripheral neuropathy 07/04/2015  . Lumbar radiculopathy, chronic 07/04/2015  . 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 Risk for falls 07/04/2015  . Sleep disturbance 07/04/2015  . Chest pain with moderate risk for cardiac etiology 06/13/2015  . Right sciatic nerve pain 10/19/2014  . Abnormal thyroid blood test 08/25/2014  . Chronic fatigue 08/09/2014  . Multiple myeloma ([place]) 07/21/2013  . Depression 01/14/2013  . Generalized anxiety disorder 01/14/2013  . Gastroesophageal reflux disease 01/13/2013  . Cyst of ovary 12/17/2010  . KNEE PAIN, BILATERAL 08/15/2010  . Abnormal gait 08/15/2010  . Atherosclerosis of coronary artery bypass graft 10/11/2009  . CHEST PAIN, NON-CARDIAC 10/11/2009  . HIP PAIN, RIGHT 08/13/2009  . GREATER TROCHANTERIC BURSITIS 08/13/2009  . LUMBOSACRAL SPONDYLOSIS WITHOUT MYELOPATHY 11/10/2008  . Osteoarthritis of lumbosacral spine without myelopathy 11/10/2008  . FOOT PAIN, BILATERAL 06/30/2008  . Hyperlipidemia 06/22/2008  . Hypertension 06/22/2008  . Raynaud's disease 06/22/2008  . BURSITIS, LEFT SHOULDER 06/22/2008    Orientation RESPIRATION BLADDER Height & Weight     Self, Time, Situation, Place  Normal Incontinent Weight: 95 lb (43.1 kg) Height:  _0  (160 cm)  BEHAVIORAL SYMPTOMS/MOOD NEUROLOGICAL BOWEL NUTRITION STATUS      Continent Diet (cardiac)  AMBULATORY STATUS COMMUNICATION OF NEEDS Skin   Limited Assist Verbally Normal  Personal Care Assistance Level of Assistance  Bathing, Dressing Bathing Assistance: Limited assistance   Dressing Assistance: Limited  assistance     Functional Limitations Info             SPECIAL CARE FACTORS FREQUENCY  PT (By licensed PT), OT (By licensed OT)     PT Frequency: 5/wk OT Frequency: 5/wk            Contractures      Additional Factors Info  Code Status, Allergies, Psychotropic Code Status Info: DNR Allergies Info: Butazolidin Phenylbutazone, Cephalosporins, Codeine, Levaquin Levofloxacin, Macrodantin, Morphine And Related, Trimethoprim, Cilostazol, Clindamycin, Dexilant Dexlansoprazole, Erythromycin, Morphine, Nitrofurantoin, Simvastatin, Sulfa Antibiotics, Vesicare Solifenacin, Clindamycin/lincomycin, Food, Ibuprofen, Lincomycin Hcl, Penicillins, Toviaz Fesoterodine Fumarate Er, Voltaren Diclofenac Sodium Psychotropic Info: zoloft         Current Medications (02/04/2017):  This is the current hospital active medication list Current Facility-Administered Medications  Medication Dose Route Frequency Provider Last Rate Last Dose  . 0.9 %  sodium chloride infusion  250 mL Intravenous PRN Belva Crome, MD      . acetaminophen (TYLENOL) tablet 650 mg  650 mg Oral Q4H PRN Belva Crome, MD   650 mg at 02/02/17 2116  . amLODipine (NORVASC) tablet 2.5 mg  2.5 mg Oral Daily Jule Ser, DO   2.5 mg at 02/03/17 4656  . angioplasty book   Does not apply Once Belva Crome, MD      . aspirin EC tablet 81 mg  81 mg Oral Daily Jule Ser, DO   81 mg at 02/03/17 8127  . atorvastatin (LIPITOR) tablet 80 mg  80 mg Oral q1800 Belva Crome, MD   80 mg at 02/03/17 1936  . calcium-vitamin D (OSCAL WITH D) 500-200 MG-UNIT per tablet 1 tablet  1 tablet Oral Q breakfast Jule Ser, DO   1 tablet at 02/03/17 [phone]  . docusate sodium (COLACE) capsule 100 mg  100 mg Oral Daily Jule Ser, DO   100 mg at 02/03/17 0174  . donepezil (ARICEPT) tablet 5 mg  5 mg Oral QHS Jule Ser, DO   5 mg at 02/03/17 2205  . famotidine (PEPCID) tablet 40 mg  40 mg Oral BID Jule Ser, DO   40 mg at  02/03/17 2205  . feeding supplement (ENSURE ENLIVE) (ENSURE ENLIVE) liquid 237 mL  237 mL Oral BID BM Belva Crome, MD   237 mL at 02/03/17 1500  . gi cocktail (Maalox,Lidocaine,Donnatal)  30 mL Oral QID PRN Jule Ser, DO   30 mL at 01/31/17 2024  . heparin injection 5,000 Units  5,000 Units Subcutaneous Q8H Belva Crome, MD   5,000 Units at 02/03/17 2210  . hydrALAZINE (APRESOLINE) tablet 25 mg  25 mg Oral BID Jule Ser, DO   25 mg at 02/03/17 2205  . iron polysaccharides (NIFEREX) capsule 150 mg  150 mg Oral Daily Jule Ser, DO   150 mg at 02/03/17 0915  . isosorbide mononitrate (IMDUR) 24 hr tablet 60 mg  60 mg Oral Daily Jule Ser, DO   60 mg at 02/03/17 9449  . loratadine (CLARITIN) tablet 10 mg  10 mg Oral q1800 Axel Filler, MD   10 mg at 02/03/17 1751  . memantine (NAMENDA) tablet 10 mg  10 mg Oral BID Jule Ser, DO   10 mg at 02/03/17 2206  . mirabegron ER (MYRBETRIQ) tablet 25 mg  25 mg Oral Daily Jule Ser, DO   25 mg at 02/03/17  1034  . montelukast (SINGULAIR) tablet 10 mg  10 mg Oral QHS Jule Ser, DO   10 mg at 02/03/17 2221  . multivitamin with minerals tablet 1 tablet  1 tablet Oral Daily Jule Ser, DO   1 tablet at 02/03/17 6440  . nitroGLYCERIN (NITROSTAT) SL tablet 0.4 mg  0.4 mg Sublingual Q5 min PRN Melanee Spry, MD   0.4 mg at 02/02/17 1045  . ondansetron (ZOFRAN) injection 4 mg  4 mg Intravenous Q6H PRN Belva Crome, MD      . ramelteon (ROZEREM) tablet 8 mg  8 mg Oral QHS Ina Homes, MD   8 mg at 02/03/17 2205  . sertraline (ZOLOFT) tablet 25 mg  25 mg Oral Daily Jule Ser, DO   25 mg at 02/03/17 3474  . sertraline (ZOLOFT) tablet 50 mg  50 mg Oral QPM Jule Ser, DO   50 mg at 02/03/17 1750  . sodium chloride flush (NS) 0.9 % injection 3 mL  3 mL Intravenous Q12H Belva Crome, MD   3 mL at 02/03/17 2206  . sodium chloride flush (NS) 0.9 % injection 3 mL  3 mL Intravenous PRN Belva Crome, MD      . ticagrelor [facility]) tablet 90 mg  90 mg Oral BID Belva Crome, MD   90 mg at 02/03/17 2220  . traMADol (ULTRAM) tablet 50 mg  50 mg Oral Q6H PRN Neva Seat, MD         Discharge Medications: Please see discharge summary for a list of discharge medications.  Relevant Imaging Results:  Relevant Lab Results:   Additional Information SS#: [id number]  Jorge Ny, LCSW

## 2017-02-04 NOTE — Progress Notes (Signed)
CARDIAC REHAB PHASE I   PRE:  Rate/Rhythm: 68 SR  BP:  Sitting: 140/45        SaO2: 98 RA  MODE:  Ambulation: 400 ft   POST:  Rate/Rhythm: 90 SR  BP:  Sitting: 180/51         SaO2: 98 RA  Pt ambulated 400 ft on RA, assist x1, mildly unsteady gait at times, tolerated well. BP somewhat elevated after walk. Pt states she has no questions regarding education at this time. Pt to recliner after walk, feet elevated, call bell within reach.   [phone] Lenna Sciara, RN, BSN 02/04/2017 9:36 AM

## 2017-02-04 NOTE — Clinical Social Work Note (Addendum)
CSW talked with patient and husband at the bedside and advised them that authorization request has been sent to Medical Director for review, per Santiago Glad with [employer]. The option of going back to ALF with additional assistance or with husband assisting was also discussed. CSW talked with Arna Medici at [facility] and provided update re: insurance authorization still pending and conversation with patient/spouse regarding possibly discharging back to ALF with additional assistance. After consultation with her boss, CSW advised by [facility] that they would not be able to accept patient back to ALF as SNF recommended by PT/OT. Patient/spouse updated. CSW will continue to follow, awaiting decision from insurance.  Received PASRR number today - [id number] E, effective 02/04/17 - 03/06/17.  Ceazia Harb Givens, MSW, LCSW Licensed Clinical Social Worker [place] [phone]

## 2017-02-04 NOTE — Progress Notes (Signed)
Progress Note  Patient Name: [name] Date of Encounter: 02/04/2017  Primary Cardiologist: Dr. Percival Spanish   Subjective   Pt seen this morning. She was doing well, without chest pain or shortness of breath. She slept well last night which she has not done recently.  Inpatient Medications    Scheduled Meds: . amLODipine  2.5 mg Oral Daily  . angioplasty book   Does not apply Once  . aspirin EC  81 mg Oral Daily  . atorvastatin  80 mg Oral q1800  . calcium-vitamin D  1 tablet Oral Q breakfast  . docusate sodium  100 mg Oral Daily  . donepezil  5 mg Oral QHS  . famotidine  40 mg Oral BID  . feeding supplement (ENSURE ENLIVE)  237 mL Oral BID BM  . heparin  5,000 Units Subcutaneous Q8H  . hydrALAZINE  25 mg Oral BID  . iron polysaccharides  150 mg Oral Daily  . isosorbide mononitrate  60 mg Oral Daily  . loratadine  10 mg Oral q1800  . memantine  10 mg Oral BID  . mirabegron ER  25 mg Oral Daily  . montelukast  10 mg Oral QHS  . multivitamin with minerals  1 tablet Oral Daily  . ramelteon  8 mg Oral QHS  . sertraline  25 mg Oral Daily  . sertraline  50 mg Oral QPM  . sodium chloride flush  3 mL Intravenous Q12H  . ticagrelor  90 mg Oral BID   Continuous Infusions: . sodium chloride     PRN Meds: sodium chloride, acetaminophen, gi cocktail, nitroGLYCERIN, ondansetron (ZOFRAN) IV, sodium chloride flush, traMADol   Vital Signs    Vitals:   02/04/17 0650 02/04/17 0700 02/04/17 1145 02/04/17 1542  BP:  (!) 104/50 (!) 123/52 (!) 119/59  Pulse:  76 77 77  Resp:  15 16 15   Temp:  98 F (36.7 C) 97.9 F (36.6 C) 98.1 F (36.7 C)  TempSrc:  Oral Oral Oral  SpO2:  98% 98% 97%  Weight: 95 lb (43.1 kg)     Height:        Intake/Output Summary (Last 24 hours) at 02/04/17 1616 Last data filed at 02/04/17 5956  Gross per 24 hour  Intake              483 ml  Output             1100 ml  Net             -617 ml   Filed Weights   02/01/17 0552 02/03/17 0200  02/04/17 0650  Weight: 99 lb (44.9 kg) 97 lb (44 kg) 95 lb (43.1 kg)    Telemetry    NSR - Personally Reviewed  ECG    No new tracings  Physical Exam  Physical Exam  Constitutional: She is oriented to person, place, and time.  Thin, frail, elderly female in no apparent distress  HENT:  Head: Normocephalic and atraumatic.  Neck: Normal range of motion. Neck supple. No JVD present.  Cardiovascular: Normal rate.  Exam reveals no gallop and no friction rub.   No murmur heard. Pulmonary/Chest: Effort normal and breath sounds normal. No respiratory distress. She has no wheezes. She has no rales.  Abdominal: Soft. There is no tenderness.  Musculoskeletal: Normal range of motion.  Neurological: She is alert and oriented to person, place, and time.  Skin: Skin is warm and dry.  Psychiatric: She has a normal mood and  affect. Her behavior is normal.   Right wrist cath site with purple bruising to elbow. Mildly tender, no swelling.    Labs    Chemistry  Recent Labs Lab 01/31/17 0623 01/31/17 0930 02/01/17 0038 02/03/17 0240  NA 136  --  137 136  K 3.3*  --  4.1 3.6  CL 101  --  104 106  CO2 25  --  25 23  GLUCOSE 93  --  99 95  BUN 18  --  25* 17  CREATININE 0.79  --  0.85 0.79  CALCIUM 9.1  --  8.8* 8.5*  PROT  --  8.0  --   --   ALBUMIN  --  3.5  --   --   AST  --  36  --   --   ALT  --  20  --   --   ALKPHOS  --  30*  --   --   BILITOT  --  0.4  --   --   GFRNONAA >60  --  >60 >60  GFRAA >60  --  >60 >60  ANIONGAP 10  --  8 7     Hematology  Recent Labs Lab 02/01/17 0038 02/02/17 0301 02/03/17 0240  WBC 4.7 4.5 4.1  RBC 2.91* 2.88* 2.64*  HGB 9.6* 9.5* 8.7*  HCT 28.5* 28.3* 25.8*  MCV 97.9 98.3 97.7  MCH 33.0 33.0 33.0  MCHC 33.7 33.6 33.7  RDW 14.3 14.4 14.6  PLT 242 224 207    Cardiac Enzymes  Recent Labs Lab 01/31/17 1050 01/31/17 1756 02/01/17 0038  TROPONINI 0.06* 0.05* 0.03*     Recent Labs Lab 01/31/17 0628  TROPIPOC 0.02      BNPNo results for input(s): BNP, PROBNP in the last 168 hours.   DDimer No results for input(s): DDIMER in the last 168 hours.   Radiology    No results found.  Cardiac Studies   02/02/17 CORONARY STENT INTERVENTION  LEFT HEART CATH AND CORS/GRAFTS ANGIOGRAPHY  Conclusion    Presentation with acute coronary syndrome, with symptoms that are atypical.  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  Widely patent left main  De novo 90% stenosis in the proximal to mid circumflex since the last catheterization in 2014.Mar[place] Kitchen  Chronic occlusion of the mid right coronary due to in-stent restenosis  Patent LAD with 30-40% diffuse ISR in a previously placed stent. 75% ostial stenosis of the diagonal jailed by the stent.  Patent saphenous vein graft to the distal right coronary. The proximal third of the vein graft contains up to 50% diffuse obstructive disease. Unchanged from 2014 angiogram.  Successful drug-eluting stent procedure on the 90% circumflex stenosis which was reduced to 0% with TIMI grade 3 flow using a 2.25 x 12 Xience Sierra DES postdilated to 2.5 mm in diameter.    Echocardiogram 02/02/17 Study Conclusions - Left ventricle: The cavity size was normal. Systolic function was   normal. The estimated ejection fraction was in the range of 60%   to 65%. Wall motion was normal; there were no regional wall   motion abnormalities. Doppler parameters are consistent with   abnormal left ventricular relaxation (grade 1 diastolic   dysfunction). Doppler parameters are consistent with elevated   ventricular end-diastolic filling pressure. - Aortic valve: There was moderate regurgitation. - Aortic root: The aortic root was normal in size. - Left atrium: The atrium was normal in size. - Right ventricle: The cavity size was normal. Wall thickness was  normal. Systolic function was normal. - Tricuspid valve: There was mild regurgitation. - Pulmonic valve: There was mild  regurgitation. - Pulmonary arteries: Systolic pressure was within the normal   range. - Inferior vena cava: The vessel was normal in size. - Pericardium, extracardiac: There was no pericardial effusion.   Patient Profile    83 y.o.femalewith a hx of GERD, ASCAD s/p remote AWMI in 2007 with BMS to RCA and LAD and subsequent CABG for restenosiswho is being seen for the evaluation of SOB and shoulder pain. Serial troponins minimally elevated with flat trend.   Assessment & Plan    1. Unstable Angina/CAD: s/p remote MI and PCI of the RCA and LAD with subsequent CABG for restenosis. LHC 02/02/17 for unstable angina showed De novo 90% stenosis in the proximal to mid circumflex since the last catheterization in 2014, successfully treated with PCI utilizing a DES. Other PCI data outlined above in cath report.  EF normal at 55-65%. No RWMA. She is stable w/o recurrent CP or dyspnea. Plan is for DAPT w/ ASA and Brilinta for a minimum of 12 months, statin therapy with Lipitor 80 mg. D/c Crestor. Continue Imdur. Consider addition of BB, if HR allows. Ambulate with cardiac rehab today.   2. HTN: Controlled on current regimen, however pt not currently on a BB. She has had low bloods pressures at previous office visits. Pt with CAD. HR is stable on telemetry. Try adding low dose metoprolol 12.5 mg BID.  3. HLD: LDL is at goal at 69 mg/dL. Continue with statin therapy, Lipitor 80 mg.   4. Anemia: Drop in Hgb since 01/31/17 from 10.3>>9.5>>8.7. Pt reports h/o IDA and takes Fe supplementation at home. Pt now on DAPT with ASA + Brilinta for DES. She will need f/u CBC in the office early next week to recheck Hgb.  If bleeding/anemia worsens, consider Brilinta monotherapy, especially after the first month. Brilinta needs to be continued for at least 6 months.  Dispo: Ready for DC, but she is elderly, frail and deconditioned. Would benefit from rehab at [facility].  Signed, Daune Perch, NP  02/04/2017,  4:16 PM    I have seen and examined the patient along with Daune Perch, NP.  I have reviewed the chart, notes and new data.  I agree with PA/NP's note.  Key new complaints: Feels well, no angina.  Key examination changes: Substantially less R forearm swelling, still has large ecchymosis  PLAN: She is still here while we wait for [employer] decision on rehab versus assisted living.  Sanda Klein, MD, [place] [phone] 02/04/2017, 5:36 PM

## 2017-02-05 MED ORDER — TICAGRELOR 90 MG PO TABS: 90.0000 mg | ORAL_TABLET | Freq: Two times a day (BID) | ORAL | 11 refills | Status: DC

## 2017-02-05 NOTE — Clinical Social Work Note (Signed)
Patient discharging to [facility] skilled facility ([facility]) private pay, as insurance denied SNF request. Market researcher determined that patient is appropriate for ALF with [facility] services. MD contacted and declined a peer-to-peer with insurance company and patient/spouse also declined an appeal. Santiago Glad with [employer] contacted and advised. Discharge summary transmitted to [facility] and [place] nurse will call report. Patient is being transported by husband to facility. CSW signing off as patient discharging today and no other intervention services needed.  [name], MSW, LCSW Licensed Clinical Social Worker [place] [phone]

## 2017-02-05 NOTE — Progress Notes (Signed)
02/05/2017 I spoke with pt and her husband re: d/c dispo and they reported the [facility] just left their room after assessing [name] and they agree that SNF level rehab would be beneficial first prior to returning to ALF level of care.  He reports they are going back to [facility] to tell Clinical biochemist.  RN, RN CM, and SW informed of this information.  Thanks,  Barbarann Ehlers. [place], [place], DPT [phone]

## 2017-02-05 NOTE — Progress Notes (Signed)
Physical Therapy Treatment Patient Details Name: [name] MRN: [id number] DOB: 10-27-1931 Today's Date: 02/05/2017    History of Present Illness Admitted with Unstable Angina/CAD: s/p remote MI and PCI of the RCA and LAD with subsequent CABG for restenosis. LHC 02/02/17 for unstable angina showed De novo 90% stenosis in the proximal to mid circumflex since the last catheterization in 2014, successfully treated with PCI utilizing a DES. Hematoma RUE post cath; has a pertinent past medical history of Anxiety; Arthritis; Asthma;  CABG;  Hypertension; Incontinent of urine;  Mild chronic anemia; Multiple myeloma (HCC); NSTEMI (non-ST elevated myocardial infarction) ([place]) (01/31/2017); Osteopenia; Overactive bladder; Panic disorder; Raynauds syndrome; Spinal stenosis of lumbar region; and Urine test positive for microalbuminuria (12/2011).    PT Comments    Pt continues to be weak and deconditioned with slow gait speed, and unsteadiness in the hallway.  She continues to be a high fall risk and would benefit from SNF level rehab before returning to ALF level of care.  PT will continue to follow acutely.   Follow Up Recommendations  SNF     Equipment Recommendations  Rolling walker with 5" wheels;3in1 (PT)    Recommendations for Other Services OT consult     Precautions / Restrictions Precautions Precautions: Fall Precaution Comments: monitor vitals, orthostatics    Mobility  Bed Mobility Overal bed mobility: Needs Assistance Bed Mobility: Supine to Sit     Supine to sit: Supervision;HOB elevated     General bed mobility comments: Increased time and reliance on bedrail for support to get EOB. HOB also elevated.   Transfers Overall transfer level: Needs assistance Equipment used: 1 person hand held assist Transfers: Sit to/from Stand Sit to Stand: Min guard         General transfer comment: Min guard assist for safety as pt is weak on her feet and  exhausted  Ambulation/Gait Ambulation/Gait assistance: Min assist Ambulation Distance (Feet):  (into the hall) Assistive device: 1 person hand held assist Gait Pattern/deviations: Step-through pattern;Staggering right;Staggering left   Gait velocity interpretation: <1.8 ft/sec, indicative of risk for recurrent falls General Gait Details: Min hand held assist for 2-3 LOB during gait to help recover and avoid falls.  Pt with slow gait cadance.  No reports of lightheadedness today, just general fatigue and sensation of heart racing (HR stable at 96, however, likely physical deconditioning playing into this sensation as well).           Balance Overall balance assessment: Needs assistance Sitting-balance support: Feet supported;No upper extremity supported Sitting balance-Leahy Scale: Good     Standing balance support: Single extremity supported Standing balance-Leahy Scale: Fair                              Cognition Arousal/Alertness: Awake/alert Behavior During Therapy: WFL for tasks assessed/performed Overall Cognitive Status: Within Functional Limits for tasks assessed                                           General Comments General comments (skin integrity, edema, etc.): in depth discussion re: d/c plan, home set up and concerns returning straight back to ALF.       Pertinent Vitals/Pain Pain Assessment: Faces Faces Pain Scale: Hurts little more Pain Location: generalized Pain Descriptors / Indicators: Sore Pain Intervention(s): Limited activity within patient's tolerance;Monitored during  session;Repositioned           PT Goals (current goals can now be found in the care plan section) Acute Rehab PT Goals Patient Stated Goal: fatigued from lack of sleep and time in the bed. Progress towards PT goals: Progressing toward goals    Frequency    Min 3X/week      PT Plan Current plan remains appropriate       AM-PAC PT "6  Clicks" Daily Activity  Outcome Measure  Difficulty turning over in bed (including adjusting bedclothes, sheets and blankets)?: None Difficulty moving from lying on back to sitting on the side of the bed? : A Little Difficulty sitting down on and standing up from a chair with arms (e.g., wheelchair, bedside commode, etc,.)?: A Lot Help needed moving to and from a bed to chair (including a wheelchair)?: A Little Help needed walking in hospital room?: A Little Help needed climbing 3-5 steps with a railing? : A Little 6 Click Score: 18    End of Session Equipment Utilized During Treatment: Gait belt Activity Tolerance: Patient limited by fatigue Patient left: in bed;with call bell/phone within reach;with family/visitor present Nurse Communication: Mobility status PT Visit Diagnosis: Other abnormalities of gait and mobility (R26.89);Dizziness and giddiness (R42)     Time: 0488-8916 PT Time Calculation (min) (ACUTE ONLY): 31 min  Charges:  $Gait Training: 8-22 mins $Self Care/Home Management: 8-22                    Nitza Schmid B. [place], [place], DPT [phone]   02/05/2017, 3:01 PM

## 2017-02-06 DIAGNOSIS — I214 Non-ST elevation (NSTEMI) myocardial infarction: Secondary | ICD-10-CM | POA: Diagnosis not present

## 2017-02-06 DIAGNOSIS — I25700 Atherosclerosis of coronary artery bypass graft(s), unspecified, with unstable angina pectoris: Secondary | ICD-10-CM | POA: Diagnosis not present

## 2017-02-06 DIAGNOSIS — I10 Essential (primary) hypertension: Secondary | ICD-10-CM | POA: Diagnosis not present

## 2017-02-06 DIAGNOSIS — I2510 Atherosclerotic heart disease of native coronary artery without angina pectoris: Secondary | ICD-10-CM | POA: Diagnosis not present

## 2017-02-06 DIAGNOSIS — M6281 Muscle weakness (generalized): Secondary | ICD-10-CM | POA: Diagnosis not present

## 2017-02-06 DIAGNOSIS — R2681 Unsteadiness on feet: Secondary | ICD-10-CM | POA: Diagnosis not present

## 2017-02-06 DIAGNOSIS — I252 Old myocardial infarction: Secondary | ICD-10-CM | POA: Diagnosis not present

## 2017-02-09 DIAGNOSIS — R2681 Unsteadiness on feet: Secondary | ICD-10-CM | POA: Diagnosis not present

## 2017-02-09 DIAGNOSIS — I25700 Atherosclerosis of coronary artery bypass graft(s), unspecified, with unstable angina pectoris: Secondary | ICD-10-CM | POA: Diagnosis not present

## 2017-02-09 DIAGNOSIS — I214 Non-ST elevation (NSTEMI) myocardial infarction: Secondary | ICD-10-CM | POA: Diagnosis not present

## 2017-02-09 DIAGNOSIS — M6281 Muscle weakness (generalized): Secondary | ICD-10-CM | POA: Diagnosis not present

## 2017-02-10 DIAGNOSIS — F339 Major depressive disorder, recurrent, unspecified: Secondary | ICD-10-CM | POA: Diagnosis not present

## 2017-02-10 DIAGNOSIS — K219 Gastro-esophageal reflux disease without esophagitis: Secondary | ICD-10-CM | POA: Diagnosis not present

## 2017-02-10 DIAGNOSIS — I2510 Atherosclerotic heart disease of native coronary artery without angina pectoris: Secondary | ICD-10-CM | POA: Diagnosis not present

## 2017-02-10 DIAGNOSIS — M6281 Muscle weakness (generalized): Secondary | ICD-10-CM | POA: Diagnosis not present

## 2017-02-11 ENCOUNTER — Ambulatory Visit (INDEPENDENT_AMBULATORY_CARE_PROVIDER_SITE_OTHER): Payer: PPO | Admitting: Physician Assistant

## 2017-02-11 ENCOUNTER — Encounter: Payer: Self-pay | Admitting: Physician Assistant

## 2017-02-11 VITALS — BP 118/55 | HR 73 | Ht 63.0 in | Wt 100.4 lb

## 2017-02-11 DIAGNOSIS — I10 Essential (primary) hypertension: Secondary | ICD-10-CM | POA: Diagnosis not present

## 2017-02-11 DIAGNOSIS — D649 Anemia, unspecified: Secondary | ICD-10-CM | POA: Diagnosis not present

## 2017-02-11 DIAGNOSIS — E785 Hyperlipidemia, unspecified: Secondary | ICD-10-CM

## 2017-02-11 DIAGNOSIS — I214 Non-ST elevation (NSTEMI) myocardial infarction: Secondary | ICD-10-CM

## 2017-02-11 MED ORDER — CARVEDILOL 3.125 MG PO TABS: 3.1250 mg | ORAL_TABLET | Freq: Two times a day (BID) | ORAL | 3 refills | Status: DC

## 2017-02-11 NOTE — Progress Notes (Signed)
Cardiology Office Note   Date:  02/11/2017   ID:  [name], DOB December 02, 1931, MRN [id number]  PCP:  [facility]  Cardiologist:  Dr. Percival Spanish, 11/25/2016  Rosaria Ferries, PA-C   Chief Complaint  Patient presents with  . Follow-up    History of Present Illness: [name] is a 83 y.o. female with a history of GERD, ASCAD s/p remote AWMI in 2007 with BMS to RCA and LAD and subsequent CABG for restenosis, HTN, HLD, OA, IBS, MML   Admit 08/0-8/30/2018 for NSTEMI, s/p DES CFX, EF nl, on ASA, Brilinta, Lipitor 80 mg, anemia w/ H&H 8.7/25.8 at d/c. D/c to [facility]. Add BB as outpt.  [name] presents for cardiology follow up.Her daughter is with her today who helps in her care.  She has not had chest pain since she left the hospital. Her anginal is a funny feeling in her chest and SOB. She has not had those symptoms since discharge. She has not been SOB with exertion. She hopes to go back to Assisted Living. She has an aide in the morning if she needs it. There is a dining room on her floor and she can walk to it.   She has arm pain when she tries to sleep, wants to go back on lorazepam. Facility MD has said yes.   She wants to go back to her exercise classes. She is using a rollator, that helps her.   Her GERD is bothering her. She is trying to watch what she eats and is taking the Pepcid bid.   She wonders if she is on too much medication. She did not tolerate Zocor in the past because of arm aches. She is having some arm aches now. She found an article describing myositis from statins. She is concerned that she is getting myositis.   Past Medical History:  Diagnosis Date  . Allergic rhinitis   . Anxiety   . Arthritis   . Asthma   . Coronary artery disease   . Coronary atherosclerosis of autologous vein bypass graft   . Coronary atherosclerosis of unspecified type of vessel, native or graft   . GERD (gastroesophageal reflux  disease)   . Hx of CABG   . Hypercholesteremia   . Hyperlipidemia    GOAL LDL <70  . Hypertension   . IBS (irritable bowel syndrome)   . Incontinent of urine   . Intermediate coronary syndrome ([place])   . Mild chronic anemia   . Multiple myeloma ([facility])    Dr. Benay Spice  . NSTEMI (non-ST elevated myocardial infarction) ([place]) 01/31/2017  . Osteopenia   . Ovarian cyst, bilateral    Rt complex  . Overactive bladder   . Panic disorder   . Raynauds syndrome   . Spinal stenosis of lumbar region   . Urine test positive for microalbuminuria 12/2011    Past Surgical History:  Procedure Laterality Date  . BUNIONECTOMY WITH HAMMERTOE RECONSTRUCTION Right 08/04/2012   Procedure:  HAMMERTOE RECONSTRUCTION;  Surgeon: Colin Rhein, MD;  Location: [facility];  Service: Orthopedics;  Laterality: Right;  HAMMER TOE RECONSTRUCTION PROBABLE FLEXOR DIGITORUM LONGUS TO PROXIMAL PHALANX TRANSFER LATERALIZED   . CAPSULOTOMY Right 08/04/2012   Procedure: CAPSULOTOMY;  Surgeon: Colin Rhein, MD;  Location: [facility];  Service: Orthopedics;  Laterality: Right;  RIGHT 2ND TOE METATARSOPHALANGEAL JOINT DORSAL CAPSULOTOMY   . CATARACT EXTRACTION, BILATERAL    . COLONOSCOPY    .  CORONARY ANGIOPLASTY WITH STENT PLACEMENT  2014   2 stents  . CORONARY ARTERY BYPASS GRAFT  2007   LIMA to LAD, SVG to RCA  non-ST segment MI 08/2009 (Dr. Tamala Julian)  . CORONARY STENT INTERVENTION N/A 02/02/2017   Procedure: CORONARY STENT INTERVENTION;  Surgeon: Belva Crome, MD;  Location: [facility];  Service: Cardiovascular;  Laterality: N/A;  Prox CFX  . DILATION AND CURETTAGE OF UTERUS    . EYE SURGERY     both cataracts  . LEFT HEART CATH AND CORS/GRAFTS ANGIOGRAPHY N/A 02/02/2017   Procedure: LEFT HEART CATH AND CORS/GRAFTS ANGIOGRAPHY;  Surgeon: Belva Crome, MD;  Location: [facility];  Service: Cardiovascular;  Laterality: N/A;  . LEFT HEART CATHETERIZATION WITH CORONARY/GRAFT  ANGIOGRAM N/A 07/09/2012   Procedure: LEFT HEART CATHETERIZATION WITH Beatrix Fetters;  Surgeon: Sinclair Grooms, MD;  Location: [facility];  Service: Cardiovascular;  Laterality: N/A;  . TONSILLECTOMY AND ADENOIDECTOMY    . UPPER GASTROINTESTINAL ENDOSCOPY      Medication Sig  . acetaminophen (TYLENOL) 650 MG CR tablet Take 1,300 mg by mouth 2 (two) times daily.  Marland Kitchen amLODipine (NORVASC) 5 MG tablet Take 0.5 tablets (2.5 mg total) by mouth daily.  Marland Kitchen aspirin EC 81 MG tablet Take 1 tablet (81 mg total) by mouth daily.  Marland Kitchen atorvastatin (LIPITOR) 80 MG tablet Take 1 tablet (80 mg total) by mouth daily at 6 PM.  . calcium-vitamin D (OSCAL WITH D) 500-200 MG-UNIT tablet Take 1 tablet by mouth daily with breakfast.   . docusate sodium (COLACE) 100 MG capsule Take 100 mg by mouth daily.  Marland Kitchen docusate sodium (COLACE) 100 MG capsule Take 1 capsule (100 mg total) by mouth daily as needed.  . donepezil (ARICEPT) 5 MG tablet Take 5 mg by mouth at bedtime.  . famotidine (PEPCID) 40 MG tablet Take 40 mg by mouth 2 (two) times daily.   . hydrALAZINE (APRESOLINE) 10 MG tablet Take 10 mg by mouth as needed. Take if systolic blood pressure is over 180  . hydrALAZINE (APRESOLINE) 25 MG tablet Take 25 mg by mouth 2 (two) times daily.   . iron polysaccharides (NIFEREX) 150 MG capsule Take 1 capsule (150 mg total) by mouth 2 (two) times daily.  . isosorbide mononitrate (IMDUR) 60 MG 24 hr tablet Take 60 mg by mouth daily.  Marland Kitchen levocetirizine (XYZAL) 5 MG tablet Take 1 tablet (5 mg total) by mouth every evening.  Marland Kitchen LORazepam (ATIVAN) 0.5 MG tablet Take 1 tablet by mouth daily.  . Melatonin 5 MG TABS Take 5 mg by mouth at bedtime.  . memantine (NAMENDA) 10 MG tablet Take 10 mg by mouth 2 (two) times daily.   . mirabegron ER (MYRBETRIQ) 25 MG TB24 tablet Take 25 mg by mouth every morning.   . montelukast (SINGULAIR) 10 MG tablet Take 10 mg by mouth at bedtime.   . Multiple Vitamin (MULITIVITAMIN WITH MINERALS)  TABS Take 1 tablet by mouth daily.  . nitroGLYCERIN (NITROSTAT) 0.4 MG SL tablet Place 1 tablet (0.4 mg total) under the tongue every 5 (five) minutes as needed for chest pain. Reported on 11/12/2015  . sertraline (ZOLOFT) 25 MG tablet Take 25 mg by mouth daily.  . sertraline (ZOLOFT) 50 MG tablet Take 50 mg by mouth every evening.   . ticagrelor (BRILINTA) 90 MG TABS tablet Take 1 tablet (90 mg total) by mouth 2 (two) times daily.   No current facility-administered medications for this visit.  Allergies:   Butazolidin [phenylbutazone]; Cephalosporins; Codeine; Levaquin [levofloxacin]; Macrodantin; Morphine and related; Trimethoprim; Cilostazol; Clindamycin; Dexilant [dexlansoprazole]; Erythromycin; Morphine; Nitrofurantoin; Simvastatin; Sulfa antibiotics; Vesicare [solifenacin]; Clindamycin/lincomycin; Food; Ibuprofen; Lincomycin hcl; Penicillins; Toviaz [fesoterodine fumarate er]; and Voltaren [diclofenac sodium]    Social History:  The patient  reports that she has never smoked. She has never used smokeless tobacco. She reports that she drinks alcohol. She reports that she does not use drugs.   Family History:  The patient's family history includes CAD in her mother; Cancer in her paternal aunt; Heart attack in her mother; Obesity in her sister.    ROS:  Please see the history of present illness. All other systems are reviewed and negative.    PHYSICAL EXAM: VS:  BP (!) 118/55   Pulse 73   Ht '5\' 3"'  (1.6 m)   Wt 100 lb 6.4 oz (45.5 kg)   BMI 17.79 kg/m  , BMI Body mass index is 17.79 kg/m. GEN: Well nourished, well developed, female in no acute distress  HEENT: normal for age  Neck: no JVD, L carotid bruit, no masses Cardiac: RRR; soft murmur, no rubs, or gallops Respiratory:  clear to auscultation bilaterally, normal work of breathing GI: soft, nontender, nondistended, + BS MS: no deformity or atrophy; trace edema; distal pulses are 2+ in all 4 extremities   Skin: warm and dry,  no rash, resolving ecchymosis from IV sites and blood draws Neuro:  Strength and sensation are intact Psych: euthymic mood, full affect   EKG:  EKG is ordered today. The ekg ordered today demonstrates SR, no acute changes, heart rate 73  LEFT HEART CATH AND CORS/GRAFTS ANGIOGRAPHY 02/02/17  Conclusion   Presentation with acute coronary syndrome, with symptoms that are atypical.  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  Widely patent left main  De novo 90% stenosis in the proximal to mid circumflex since the last catheterization in 2014.Marland Kitchen  Chronic occlusion of the mid right coronary due to in-stent restenosis  Patent LAD with 30-40% diffuse ISR in a previously placed stent. 75% ostial stenosis of the diagonal jailed by the stent.  Patent saphenous vein graft to the distal right coronary. The proximal third of the vein graft contains up to 50% diffuse obstructive disease. Unchanged from 2014 angiogram.  Successful drug-eluting stent procedure on the 90% circumflex stenosis which was reduced to 0% with TIMI grade 3 flow using a 2.25 x 12 Xience Sierra DES postdilated to 2.5 mm in diameter. RECOMMENDATIONS:   Dual antiplatelet therapy for 12 months  Risk factor modification including LDL less than 70.    Post-Intervention Diagram       __________ Echocardiogram 02/02/17 Study Conclusions - Left ventricle: The cavity size was normal. Systolic function was normal. The estimated ejection fraction was in the range of 60% to 65%. Wall motion was normal; there were no regional wall motion abnormalities. Doppler parameters are consistent with abnormal left ventricular relaxation (grade 1 diastolic dysfunction). Doppler parameters are consistent with elevated ventricular end-diastolic filling pressure. - Aortic valve: There was moderate regurgitation. - Aortic root: The aortic root was normal in size. - Left atrium: The atrium was normal in size. - Right ventricle:  The cavity size was normal. Wall thickness was normal. Systolic function was normal. - Tricuspid valve: There was mild regurgitation. - Pulmonic valve: There was mild regurgitation. - Pulmonary arteries: Systolic pressure was within the normalrange. - Inferior vena cava: The vessel was normal in size. - Pericardium, extracardiac: There was no pericardial  effusion.   Recent Labs: 07/18/2016: B Natriuretic Peptide 393.0; Magnesium 1.9 01/31/2017: ALT 20; TSH 4.080 02/03/2017: BUN 17; Creatinine, Ser 0.79; Hemoglobin 8.7; Platelets 207; Potassium 3.6; Sodium 136    Lipid Panel    Component Value Date/Time   CHOL 152 02/01/2017 0919   TRIG 41 02/01/2017 0919   HDL 75 02/01/2017 0919   CHOLHDL 2.0 02/01/2017 0919   VLDL 8 02/01/2017 0919   LDLCALC 69 02/01/2017 0919     Wt Readings from Last 3 Encounters:  02/11/17 100 lb 6.4 oz (45.5 kg)  02/04/17 95 lb (43.1 kg)  11/25/16 102 lb (46.3 kg)     Other studies Reviewed: Additional studies/ records that were reviewed today include: hospital records and testing.  ASSESSMENT AND PLAN:  1.  NSTEMI: She is on good therapy, with ASA, Brilinta, Nitrates, high-dose statin. I will add Coreg 3.125 mg bid, in order to do this, will stop the amlodipine. She has been increasing her activity level with physical therapy and has been doing well with this. It is appropriate for her to go back to assisted living and a note to this effect was sent back with her to that facility.  2. Anemia: She is not losing any blood of which she is aware. Labs were rechecked at the facility and her hemoglobin had improved to 9.5 with a hematocrit of 27.7. She should follow-up with her PCP for this.  3. Hypertension: Her blood pressure is well controlled but in order to add the beta blocker will discontinue the amlodipine 2.5 mg daily.  4. Dyslipidemia: A lipid profile during her hospitalization showed an HDL of 75 and an LDL of 69. Because of her MI, she is on  high-dose statin. She has had problems tolerating them before. Of note, the labs she had performed on 02/06/2017 showed an elevated AST of 75 and an ALT of 51. These are less than 3 times normal but they were normal during her hospitalization.   Discuss with M.D. if we should decrease her statin.   Current medicines are reviewed at length with the patient today.  The patient has concerns regarding medicines.  The following changes have been made:  Stop amlodipine, add carvedilol  Labs/ tests ordered today include:   Orders Placed This Encounter  Procedures  . EKG 12-Lead     Disposition:   FU with Dr. Percival Spanish  Signed, Rosaria Ferries, PA-C  02/11/2017 6:06 PM    [place] Phone: [phone]; Fax: [phone]  This note was written with the assistance of speech recognition software. Please excuse any transcriptional errors.

## 2017-02-11 NOTE — Patient Instructions (Signed)
Medication Instructions:  STOP AMLODIPINE START COREG 3.125MG  TWICE DAILY If you need a refill on your cardiac medications before your next appointment, please call your pharmacy.   Follow-Up: Your physician wants you to follow-up in: [place].   Special Instructions: INCREASE ACTIVITY AS TOLERATED   Thank you for choosing [facility] at [facility]!!

## 2017-02-13 DIAGNOSIS — K219 Gastro-esophageal reflux disease without esophagitis: Secondary | ICD-10-CM | POA: Diagnosis not present

## 2017-02-17 ENCOUNTER — Telehealth: Payer: Self-pay | Admitting: Cardiology

## 2017-02-17 ENCOUNTER — Encounter (HOSPITAL_COMMUNITY): Payer: Self-pay | Admitting: Emergency Medicine

## 2017-02-17 ENCOUNTER — Emergency Department (HOSPITAL_COMMUNITY)
Admission: EM | Admit: 2017-02-17 | Discharge: 2017-02-17 | Disposition: A | Discharge status: Home / Self Care | Payer: PPO | Attending: Emergency Medicine | Admitting: Emergency Medicine

## 2017-02-17 ENCOUNTER — Emergency Department (HOSPITAL_COMMUNITY): Payer: PPO

## 2017-02-17 DIAGNOSIS — R0602 Shortness of breath: Secondary | ICD-10-CM | POA: Diagnosis not present

## 2017-02-17 DIAGNOSIS — Z7982 Long term (current) use of aspirin: Secondary | ICD-10-CM | POA: Insufficient documentation

## 2017-02-17 DIAGNOSIS — Z951 Presence of aortocoronary bypass graft: Secondary | ICD-10-CM | POA: Diagnosis not present

## 2017-02-17 DIAGNOSIS — Z955 Presence of coronary angioplasty implant and graft: Secondary | ICD-10-CM | POA: Diagnosis not present

## 2017-02-17 DIAGNOSIS — J45909 Unspecified asthma, uncomplicated: Secondary | ICD-10-CM | POA: Diagnosis not present

## 2017-02-17 DIAGNOSIS — R079 Chest pain, unspecified: Secondary | ICD-10-CM

## 2017-02-17 DIAGNOSIS — Z79899 Other long term (current) drug therapy: Secondary | ICD-10-CM | POA: Diagnosis not present

## 2017-02-17 DIAGNOSIS — I10 Essential (primary) hypertension: Secondary | ICD-10-CM | POA: Diagnosis not present

## 2017-02-17 DIAGNOSIS — R0789 Other chest pain: Secondary | ICD-10-CM

## 2017-02-17 DIAGNOSIS — I2510 Atherosclerotic heart disease of native coronary artery without angina pectoris: Secondary | ICD-10-CM | POA: Diagnosis not present

## 2017-02-17 LAB — COMPREHENSIVE METABOLIC PANEL
ALBUMIN: 3.6 g/dL (ref 3.5–5.0)
ALK PHOS: 30 U/L — AB (ref 38–126)
ALT: 23 U/L (ref 14–54)
AST: 35 U/L (ref 15–41)
Anion gap: 10 (ref 5–15)
BILIRUBIN TOTAL: 0.5 mg/dL (ref 0.3–1.2)
BUN: 15 mg/dL (ref 6–20)
CALCIUM: 9 mg/dL (ref 8.9–10.3)
CO2: 22 mmol/L (ref 22–32)
CREATININE: 0.73 mg/dL (ref 0.44–1.00)
Chloride: 95 mmol/L — ABNORMAL LOW (ref 101–111)
GFR calc Af Amer: >60 mL/min (ref >60)
GFR calc non Af Amer: >60 mL/min (ref >60)
GLUCOSE: 98 mg/dL (ref 65–99)
Potassium: 4 mmol/L (ref 3.5–5.1)
Sodium: 127 mmol/L — ABNORMAL LOW (ref 135–145)
TOTAL PROTEIN: 7.5 g/dL (ref 6.5–8.1)

## 2017-02-17 LAB — CBC WITH DIFFERENTIAL/PLATELET
BASOS PCT: 1 %
Basophils Absolute: 0 10*3/uL (ref 0.0–0.1)
Eosinophils Absolute: 0.1 10*3/uL (ref 0.0–0.7)
Eosinophils Relative: 2 %
HEMATOCRIT: 25.4 % — AB (ref 36.0–46.0)
HEMOGLOBIN: 8.8 g/dL — AB (ref 12.0–15.0)
LYMPHS PCT: 32 %
Lymphs Abs: 0.9 10*3/uL (ref 0.7–4.0)
MCH: 33.5 pg (ref 26.0–34.0)
MCHC: 34.6 g/dL (ref 30.0–36.0)
MCV: 96.6 fL (ref 78.0–100.0)
Monocytes Absolute: 0.3 10*3/uL (ref 0.1–1.0)
Monocytes Relative: 12 %
NEUTROS ABS: 1.5 10*3/uL — AB (ref 1.7–7.7)
NEUTROS PCT: 53 %
Platelets: 276 10*3/uL (ref 150–400)
RBC: 2.63 MIL/uL — ABNORMAL LOW (ref 3.87–5.11)
RDW: 14 % (ref 11.5–15.5)
WBC: 2.8 10*3/uL — ABNORMAL LOW (ref 4.0–10.5)

## 2017-02-17 LAB — BRAIN NATRIURETIC PEPTIDE: B Natriuretic Peptide: 62.4 pg/mL (ref 0.0–100.0)

## 2017-02-17 LAB — I-STAT TROPONIN, ED
TROPONIN I, POC: 0 ng/mL (ref 0.00–0.08)
Troponin i, poc: 0.01 ng/mL (ref 0.00–0.08)

## 2017-02-17 LAB — LIPASE, BLOOD: Lipase: 55 U/L — ABNORMAL HIGH (ref 11–51)

## 2017-02-17 MED ORDER — ACETAMINOPHEN 325 MG PO TABS: 650.0000 mg | ORAL_TABLET | Freq: Four times a day (QID) | ORAL | Status: DC | PRN

## 2017-02-17 MED ORDER — GI COCKTAIL ~~LOC~~
30.0000 mL | Freq: Once | ORAL | Status: AC
  Administered 2017-02-17: 30 mL via ORAL
  Filled 2017-02-17: qty 30

## 2017-02-17 MED ORDER — SODIUM CHLORIDE 0.9 % IV SOLN: INTRAVENOUS | Status: DC

## 2017-02-17 MED ORDER — NITROGLYCERIN 2 % TD OINT: 1.0000 [in_us] | TOPICAL_OINTMENT | Freq: Once | TRANSDERMAL | Status: DC

## 2017-02-17 MED ORDER — HYDRALAZINE HCL 10 MG PO TABS
10.0000 mg | ORAL_TABLET | Freq: Once | ORAL | Status: AC
  Administered 2017-02-17: 10 mg via ORAL
  Filled 2017-02-17: qty 1

## 2017-02-17 NOTE — Telephone Encounter (Signed)
New Message  Pt c/o Shortness Of Breath: STAT if SOB developed within the last 24 hours or pt is noticeably SOB on the phone  1. Are you currently SOB (can you hear that pt is SOB on the phone)? yes  2. How long have you been experiencing SOB? 3 days  3. Are you SOB when sitting or when up moving around? moving  4. Are you currently experiencing any other symptoms? no

## 2017-02-17 NOTE — ED Provider Notes (Signed)
Pt remained pain free,  Troponin negative x 2.  I spoke to Cardiology on call who reviewed EKG and labs,  She advised follow up in office.  Pt counseled on follow up and agrees with plan.   Sidney Ace 02/17/17 [place], MD 02/26/17 [phone]

## 2017-02-17 NOTE — Telephone Encounter (Signed)
Patient of Dr. Percival Spanish who is s/p NSTEMI w/PCI on 8/27  Returned call to patient. Lady answering patient's phone states they have EMS evaluating patient for her SOB and they plan to transport her to hospital. Informed her that this is appropriate action and that I would notified MD.

## 2017-02-17 NOTE — ED Provider Notes (Signed)
[facility] DEPT Provider Note   CSN: [id number] Arrival date & time: 02/17/17  1327     History   Chief Complaint Chief Complaint  Patient presents with  . Chest Pain    HPI   Blood pressure (!) 161/58, pulse 63, temperature 98.8 F (37.1 C), temperature source Oral, resp. rate 16, height '5\' 3"'  (1.6 m), weight 45.4 kg (100 lb), SpO2 99 %.  [name] is a 83 y.o. female with past medical history significant for GERD, ACS, recent cath and an STEMI taking Proventil and 81 mg aspirin complaining of pressure-like pain to chest which is mobile going from the retrosternal area to the right side onset this morning while she was resting. She states it feels like reflux and is associated with shortness of breath she also has pain in her right arm. She denies exacerbation with is her exertion, pleuritic or positional exacerbation. She was evaluated by EMS and given a full dose aspirin and 5 sublingual nitroglycerin which did not improve her pain. He denies worsening lower extremity peripheral edema, cough, fever chills orthopnea or PND.   Past Medical History:  Diagnosis Date  . Allergic rhinitis   . Anxiety   . Arthritis   . Asthma   . Coronary artery disease   . Coronary atherosclerosis of autologous vein bypass graft   . Coronary atherosclerosis of unspecified type of vessel, native or graft   . GERD (gastroesophageal reflux disease)   . Hx of CABG   . Hypercholesteremia   . Hyperlipidemia    GOAL LDL <70  . Hypertension   . IBS (irritable bowel syndrome)   . Incontinent of urine   . Intermediate coronary syndrome ([place])   . Mild chronic anemia   . Multiple myeloma (HCC)    Dr. Benay Spice  . NSTEMI (non-ST elevated myocardial infarction) ([place]) 01/31/2017  . Osteopenia   . Ovarian cyst, bilateral    Rt complex  . Overactive bladder   . Panic disorder   . Raynauds syndrome   . Spinal stenosis of lumbar region   . Urine test positive for microalbuminuria 12/2011     Patient Active Problem List   Diagnosis Date Noted  . NSTEMI (non-ST elevated myocardial infarction) ([place]) 02/02/2017  . SOB (shortness of breath) 09/02/2016  . Dizziness 09/02/2016  . Non-ST elevation (NSTEMI) myocardial infarction ([place]) 11/26/2015  . Polymyalgia rheumatica ([place]) 11/26/2015  . Hypokalemia 11/26/2015  . Mild persistent asthma 11/12/2015  . Coronary artery disease involving autologous artery coronary bypass graft with angina pectoris with documented spasm ([place]) 11/12/2015  . Multiple myeloma in remission ([place]) 11/12/2015  . Current use of beta blocker 11/12/2015  . Gastroesophageal reflux disease without esophagitis 11/12/2015  . Other allergic rhinitis 11/12/2015  . Weight loss, non-intentional 10/03/2015  . Asthma 09/24/2015  . Asthma in adult without complication 43/88/8757  . Seasonal allergic rhinitis due to pollen 09/24/2015  . Cervical radiculopathy 07/25/2015  . Chronic coronary artery disease 07/25/2015  . Coronary artery disease 07/25/2015  . Disturbance in sleep behavior 07/25/2015  . Elevated CK 07/25/2015  . Elevated creatine kinase level 07/25/2015  . Generalized osteoarthritis of hand 07/25/2015  . Other long term (current) drug therapy 07/25/2015  . Overactive bladder 07/25/2015  . Restless leg 07/25/2015  . Restless leg syndrome 07/25/2015  . Sleep disturbances 07/25/2015  . Idiopathic peripheral neuropathy 07/04/2015  . Lumbar radiculopathy, chronic 07/04/2015  . 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 Risk for falls 07/04/2015  . Sleep disturbance 07/04/2015  .  Chest pain with moderate risk for cardiac etiology 06/13/2015  . Right sciatic nerve pain 10/19/2014  . Abnormal thyroid blood test 08/25/2014  . Chronic fatigue 08/09/2014  . Multiple myeloma ([place]) 07/21/2013  . Depression 01/14/2013  . Generalized anxiety disorder 01/14/2013  . Gastroesophageal reflux disease 01/13/2013  . Cyst of ovary 12/17/2010  . KNEE PAIN, BILATERAL 08/15/2010   . Abnormal gait 08/15/2010  . Atherosclerosis of coronary artery bypass graft 10/11/2009  . CHEST PAIN, NON-CARDIAC 10/11/2009  . HIP PAIN, RIGHT 08/13/2009  . GREATER TROCHANTERIC BURSITIS 08/13/2009  . LUMBOSACRAL SPONDYLOSIS WITHOUT MYELOPATHY 11/10/2008  . Osteoarthritis of lumbosacral spine without myelopathy 11/10/2008  . FOOT PAIN, BILATERAL 06/30/2008  . Hyperlipidemia 06/22/2008  . Hypertension 06/22/2008  . Raynaud's disease 06/22/2008  . BURSITIS, LEFT SHOULDER 06/22/2008    Past Surgical History:  Procedure Laterality Date  . BUNIONECTOMY WITH HAMMERTOE RECONSTRUCTION Right 08/04/2012   Procedure:  HAMMERTOE RECONSTRUCTION;  Surgeon: Colin Rhein, MD;  Location: [facility];  Service: Orthopedics;  Laterality: Right;  HAMMER TOE RECONSTRUCTION PROBABLE FLEXOR DIGITORUM LONGUS TO PROXIMAL PHALANX TRANSFER LATERALIZED   . CAPSULOTOMY Right 08/04/2012   Procedure: CAPSULOTOMY;  Surgeon: Colin Rhein, MD;  Location: [facility];  Service: Orthopedics;  Laterality: Right;  RIGHT 2ND TOE METATARSOPHALANGEAL JOINT DORSAL CAPSULOTOMY   . CATARACT EXTRACTION, BILATERAL    . COLONOSCOPY    . CORONARY ANGIOPLASTY WITH STENT PLACEMENT  2014   2 stents  . CORONARY ARTERY BYPASS GRAFT  2007   LIMA to LAD, SVG to RCA  non-ST segment MI 08/2009 (Dr. Tamala Julian)  . CORONARY STENT INTERVENTION N/A 02/02/2017   Procedure: CORONARY STENT INTERVENTION;  Surgeon: Belva Crome, MD;  Location: [facility];  Service: Cardiovascular;  Laterality: N/A;  Prox CFX  . DILATION AND CURETTAGE OF UTERUS    . EYE SURGERY     both cataracts  . LEFT HEART CATH AND CORS/GRAFTS ANGIOGRAPHY N/A 02/02/2017   Procedure: LEFT HEART CATH AND CORS/GRAFTS ANGIOGRAPHY;  Surgeon: Belva Crome, MD;  Location: [facility];  Service: Cardiovascular;  Laterality: N/A;  . LEFT HEART CATHETERIZATION WITH CORONARY/GRAFT ANGIOGRAM N/A 07/09/2012   Procedure: LEFT HEART CATHETERIZATION  WITH Beatrix Fetters;  Surgeon: Sinclair Grooms, MD;  Location: [facility] C[facility];  Service: Cardiovascular;  Laterality: N/A;  . TONSILLECTOMY AND ADENOIDECTOMY    . UPPER GASTROINTESTINAL ENDOSCOPY      OB History    No data available       Home Medications    Prior to Admission medications   Medication Sig Start Date End Date Taking? Authorizing Provider  acetaminophen (TYLENOL) 650 MG CR tablet Take 1,300 mg by mouth 2 (two) times daily.   Yes [provider]  aspirin EC 81 MG tablet Take 1 tablet (81 mg total) by mouth daily. 01/21/16  Yes Minus Breeding, MD  atorvastatin (LIPITOR) 80 MG tablet Take 1 tablet (80 mg total) by mouth daily at 6 PM. 02/03/17  Yes Lyda Jester M, PA-C  calcium-vitamin D (OSCAL WITH D) 500-200 MG-UNIT tablet Take 1 tablet by mouth daily with breakfast.    Yes [provider]  carvedilol (COREG) 3.125 MG tablet Take 1 tablet (3.125 mg total) by mouth 2 (two) times daily with a meal. 02/11/17  Yes Barrett, Evelene Croon, PA-C  docusate sodium (COLACE) 100 MG capsule Take 100 mg by mouth daily.   Yes [provider]  docusate sodium (COLACE) 100 MG  capsule Take 1 capsule (100 mg total) by mouth daily as needed. Patient taking differently: Take 100 mg by mouth daily as needed for mild constipation.  02/03/17 02/03/18 Yes Simmons, Brittainy M, PA-C  hydrALAZINE (APRESOLINE) 10 MG tablet Take 10 mg by mouth as needed. Take if systolic blood pressure is over 180   Yes [provider]  hydrALAZINE (APRESOLINE) 25 MG tablet Take 25 mg by mouth 2 (two) times daily.    Yes [provider]  iron polysaccharides (NIFEREX) 150 MG capsule Take 1 capsule (150 mg total) by mouth 2 (two) times daily. 02/03/17  Yes Lyda Jester M, PA-C  isosorbide mononitrate (IMDUR) 60 MG 24 hr tablet Take 60 mg by mouth daily.   Yes [provider]  levocetirizine (XYZAL) 5 MG tablet Take 1 tablet (5 mg total) by mouth every  evening. 09/30/16  Yes Bardelas, Jose A, MD  LORazepam (ATIVAN) 0.5 MG tablet Take 0.5 mg by mouth 2 (two) times daily as needed for anxiety.  01/09/17  Yes [provider]  Melatonin 5 MG TABS Take 5 mg by mouth at bedtime.   Yes [provider]  memantine (NAMENDA) 10 MG tablet Take 10 mg by mouth 2 (two) times daily.    Yes [provider]  mirabegron ER (MYRBETRIQ) 25 MG TB24 tablet Take 25 mg by mouth every morning.    Yes [provider]  Multiple Vitamin (MULITIVITAMIN WITH MINERALS) TABS Take 1 tablet by mouth daily.   Yes [provider]  nitroGLYCERIN (NITROSTAT) 0.4 MG SL tablet Place 1 tablet (0.4 mg total) under the tongue every 5 (five) minutes as needed for chest pain. Reported on 11/12/2015 02/03/17  Yes Lyda Jester M, PA-C  omeprazole (PRILOSEC) 20 MG capsule Take 20 mg by mouth 2 (two) times daily before a meal.   Yes [provider]  polyethylene glycol (MIRALAX / GLYCOLAX) packet Take 17 g by mouth daily as needed for mild constipation.   Yes [provider]  sertraline (ZOLOFT) 25 MG tablet Take 25 mg by mouth every morning.    Yes [provider]  sertraline (ZOLOFT) 50 MG tablet Take 50 mg by mouth every evening.    Yes [provider]  ticagrelor (BRILINTA) 90 MG TABS tablet Take 1 tablet (90 mg total) by mouth 2 (two) times daily. 02/05/17  Yes Bhagat, Bhavinkumar, PA  donepezil (ARICEPT) 5 MG tablet Take 5 mg by mouth at bedtime.    [provider]  famotidine (PEPCID) 40 MG tablet Take 40 mg by mouth 2 (two) times daily.     [provider]  montelukast (SINGULAIR) 10 MG tablet Take 10 mg by mouth at bedtime.     [provider]    Family History Family History  Problem Relation Age of Onset  . Heart attack Mother   . CAD Mother   . Obesity Sister   . Cancer Paternal Aunt        breast  . Asthma Neg Hx   . Eczema Neg Hx   . Urticaria Neg Hx   . Allergic  rhinitis Neg Hx   . Angioedema Neg Hx   . Atopy Neg Hx   . Immunodeficiency Neg Hx     Social History Social History  Substance Use Topics  . Smoking status: Never Smoker  . Smokeless tobacco: Never Used  . Alcohol use Yes     Comment: occasional     Allergies   Butazolidin [phenylbutazone]; Cephalosporins;  Codeine; Levaquin [levofloxacin]; Macrodantin; Morphine and related; Trimethoprim; Buprenorphine; Cilostazol; Clindamycin; Dexilant [dexlansoprazole]; Erythromycin; Morphine; Nitrofurantoin; Simvastatin; Sulfa antibiotics; Vesicare [solifenacin]; Clindamycin/lincomycin; Food; Ibuprofen; Lincomycin hcl; Penicillins; Toviaz [fesoterodine fumarate er]; and Voltaren [diclofenac sodium]   Review of Systems Review of Systems  A complete review of systems was obtained and all systems are negative except as noted in the HPI and PMH.   Physical Exam Updated Vital Signs BP (!) 161/68   Pulse 66   Temp 98.8 F (37.1 C) (Oral)   Resp 16   Ht '5\' 3"'  (1.6 m)   Wt 45.4 kg (100 lb)   SpO2 100%   BMI 17.71 kg/m   Physical Exam   ED Treatments / Results  Labs (all labs ordered are listed, but only abnormal results are displayed) Labs Reviewed  CBC WITH DIFFERENTIAL/PLATELET - Abnormal; Notable for the following:       Result Value   WBC 2.8 (*)    RBC 2.63 (*)    Hemoglobin 8.8 (*)    HCT 25.4 (*)    Neutro Abs 1.5 (*)    All other components within normal limits  COMPREHENSIVE METABOLIC PANEL - Abnormal; Notable for the following:    Sodium 127 (*)    Chloride 95 (*)    Alkaline Phosphatase 30 (*)    All other components within normal limits  LIPASE, BLOOD - Abnormal; Notable for the following:    Lipase 55 (*)    All other components within normal limits  BRAIN NATRIURETIC PEPTIDE  I-STAT TROPONIN, ED    EKG  EKG Interpretation  Date/Time:  Tuesday February 17 2017 13:34:25 EDT Ventricular Rate:  65 PR Interval:    QRS Duration: 88 QT Interval:  397 QTC  Calculation: 413 R Axis:   32 Text Interpretation:  Sinus rhythm No significant change since Confirmed by Orlie Dakin [phone]) on 02/17/2017 1:44:04 PM       Radiology Dg Chest 2 View  Result Date: 02/17/2017 CLINICAL DATA:  Central chest pain, shortness of Breath EXAM: CHEST  2 VIEW COMPARISON:  01/31/2017 FINDINGS: There is hyperinflation of the lungs compatible with COPD. Prior CABG. No confluent opacities or effusions. No acute bony abnormality. IMPRESSION: COPD.  No active disease. Electronically Signed   By: Rolm Baptise M.D.   On: 02/17/2017 15:17    Procedures Procedures (including critical care time)  Medications Ordered in ED Medications  0.9 %  sodium chloride infusion (not administered)  gi cocktail (Maalox,Lidocaine,Donnatal) (not administered)     Initial Impression / Assessment and Plan / ED Course  I have reviewed the triage vital signs and the nursing notes.  Pertinent labs & imaging results that were available during my care of the patient were reviewed by me and considered in my medical decision making (see chart for details).     Vitals:   02/17/17 1331 02/17/17 1500  BP: (!) 161/58 (!) 161/68  Pulse: 63 66  Resp: 16 16  Temp: 98.8 F (37.1 C)   TempSrc: Oral   SpO2: 99% 100%  Weight: 45.4 kg (100 lb)   Height: '5\' 3"'  (1.6 m)     Medications  0.9 %  sodium chloride infusion (not administered)  gi cocktail (Maalox,Lidocaine,Donnatal) (not administered)    [name] is 83 y.o. female presenting with Nonspecific epigastric and chest pain onset this morning described as pressure-like not alleviated with nitroglycerin, EKG unchanged and troponin negative. She did have a recent NSTEMI with stent placement.plan to delta troponin after  discussing with cardiology. Patient assessed before she received GI cocktail states that the pain is essentially resolved. Case signed out to [place] at shift change. Plan to follow-up delta troponin and discuss with  cardiology.   Final Clinical Impressions(s) / ED Diagnoses   Final diagnoses:  Atypical chest pain    New Prescriptions New Prescriptions   No medications on file     Waynetta Pean 02/17/17 [place], MD 02/17/17 [phone]

## 2017-02-17 NOTE — ED Notes (Signed)
Spoke to son who arrived, he is concerned that pt is being discharged w/ "a little chest pain," and w/ her hx.  Provider notified

## 2017-02-17 NOTE — ED Provider Notes (Addendum)
Complains of anterior chest pain onset 10:30 AM today onset while talking pain has a pleuritic component and feels like GERD she's had in the past she also complains of right arm pain however right arm pain is worse with moving her armand improved with remaining still and feels like arthritis she's had in the past.  Pt treated with 5 sl ntg and asa in the field without relief. Pain does not feel similar to recent MI Orlie Dakin, MD 02/17/17 [place], MD 02/17/17 [phone]

## 2017-02-17 NOTE — ED Notes (Signed)
EKG given to Dr. Michelle Piper.

## 2017-02-17 NOTE — Discharge Instructions (Signed)
Call your Cardiologist to schedule to be seen for recheck.   Return if any problems.

## 2017-02-17 NOTE — ED Notes (Signed)
Provider bedside.

## 2017-02-17 NOTE — ED Notes (Signed)
Pt states she has had GERD 2-3 days ago, but had gone away, started again today after waking up.

## 2017-02-17 NOTE — ED Triage Notes (Signed)
Pt here from [facility] with c/o chest pain and epigastric pain  , pt received 324 mg asa and 5 nitro , chest pain down to a 3 upon arrival ,

## 2017-02-20 ENCOUNTER — Ambulatory Visit: Payer: Self-pay | Admitting: Cardiology

## 2017-02-23 ENCOUNTER — Telehealth: Payer: Self-pay

## 2017-02-23 NOTE — Telephone Encounter (Signed)
Pt called that she usually has her labs drawn at [facility] before seeing Dr Benay Spice. She has been moved into assisted living and their fax is [phone]. Can make attn Towanda Octave NP  Printed orders from [facility] and faxed. Lab appt 9/18 cancelled.

## 2017-02-23 NOTE — Telephone Encounter (Signed)
Faxed order for CBC,CMET,SPEP,light chains and IgG to [facility].

## 2017-02-24 ENCOUNTER — Other Ambulatory Visit: Payer: Self-pay

## 2017-02-24 ENCOUNTER — Encounter: Payer: Self-pay | Admitting: Oncology

## 2017-02-24 DIAGNOSIS — L738 Other specified follicular disorders: Secondary | ICD-10-CM | POA: Diagnosis not present

## 2017-02-24 DIAGNOSIS — C9000 Multiple myeloma not having achieved remission: Secondary | ICD-10-CM | POA: Diagnosis not present

## 2017-02-24 DIAGNOSIS — L821 Other seborrheic keratosis: Secondary | ICD-10-CM | POA: Diagnosis not present

## 2017-02-24 DIAGNOSIS — D692 Other nonthrombocytopenic purpura: Secondary | ICD-10-CM | POA: Diagnosis not present

## 2017-02-24 DIAGNOSIS — D1801 Hemangioma of skin and subcutaneous tissue: Secondary | ICD-10-CM | POA: Diagnosis not present

## 2017-02-26 DIAGNOSIS — I2510 Atherosclerotic heart disease of native coronary artery without angina pectoris: Secondary | ICD-10-CM | POA: Diagnosis not present

## 2017-02-26 DIAGNOSIS — F0390 Unspecified dementia without behavioral disturbance: Secondary | ICD-10-CM | POA: Diagnosis not present

## 2017-02-26 DIAGNOSIS — G894 Chronic pain syndrome: Secondary | ICD-10-CM | POA: Diagnosis not present

## 2017-02-26 DIAGNOSIS — F339 Major depressive disorder, recurrent, unspecified: Secondary | ICD-10-CM | POA: Diagnosis not present

## 2017-03-02 DIAGNOSIS — R5382 Chronic fatigue, unspecified: Secondary | ICD-10-CM | POA: Diagnosis not present

## 2017-03-02 DIAGNOSIS — K219 Gastro-esophageal reflux disease without esophagitis: Secondary | ICD-10-CM | POA: Diagnosis not present

## 2017-03-02 DIAGNOSIS — Z86010 Personal history of colonic polyps: Secondary | ICD-10-CM | POA: Insufficient documentation

## 2017-03-02 DIAGNOSIS — K559 Vascular disorder of intestine, unspecified: Secondary | ICD-10-CM | POA: Diagnosis not present

## 2017-03-03 ENCOUNTER — Other Ambulatory Visit: Payer: Self-pay

## 2017-03-03 ENCOUNTER — Ambulatory Visit (HOSPITAL_BASED_OUTPATIENT_CLINIC_OR_DEPARTMENT_OTHER): Payer: PPO | Admitting: Oncology

## 2017-03-03 ENCOUNTER — Telehealth: Payer: Self-pay | Admitting: Oncology

## 2017-03-03 VITALS — BP 146/52 | HR 70 | Temp 97.9°F | Resp 18 | Ht 63.0 in | Wt 100.8 lb

## 2017-03-03 DIAGNOSIS — M8580 Other specified disorders of bone density and structure, unspecified site: Secondary | ICD-10-CM

## 2017-03-03 DIAGNOSIS — D630 Anemia in neoplastic disease: Secondary | ICD-10-CM

## 2017-03-03 DIAGNOSIS — C9000 Multiple myeloma not having achieved remission: Secondary | ICD-10-CM | POA: Diagnosis not present

## 2017-03-03 DIAGNOSIS — C9001 Multiple myeloma in remission: Secondary | ICD-10-CM

## 2017-03-03 NOTE — Telephone Encounter (Signed)
Scheduled appt per 9/25 los - Gave patient AVS and calender per los.  

## 2017-03-03 NOTE — Progress Notes (Signed)
[facility] OFFICE PROGRESS NOTE   Diagnosis: Indolent multiple myeloma  INTERVAL HISTORY:   [name] returns as scheduled. No recent infection. She was admitted in August with a myocardial infarction and underwent placement of a circumflex artery stent. She continues to have reflux symptoms. She complains of a tightness at the anterior subcostal region bilaterally. She has been evaluated by gastroenterology and is being scheduled for an abdominal ultrasound.  Objective:  Vital signs in last 24 hours:  Blood pressure (!) 146/52, pulse 70, temperature 97.9 F (36.6 C), temperature source Oral, resp. rate 18, height '5\' 3"'$  (1.6 m), weight 100 lb 12.8 oz (45.7 kg), SpO2 100 %.    HEENT: Neck without mass Resp: Lungs clear bilaterally, distant breath sounds Cardio: Regular rate and rhythm GI: No hepatosplenomegaly, nontender, no mass Vascular: No leg edema   Lab Results:  Lab Results  Component Value Date   WBC 2.8 (L) 02/17/2017   HGB 8.8 (L) 02/17/2017   HCT 25.4 (L) 02/17/2017   MCV 96.6 02/17/2017   PLT 276 02/17/2017   NEUTROABS 1.5 (L) 02/17/2017    CMP     Component Value Date/Time   NA 127 (L) 02/17/2017 1424   NA 138 09/20/2013 0949   K 4.0 02/17/2017 1424   K 4.4 09/20/2013 0949   CL 95 (L) 02/17/2017 1424   CL 97 (L) 09/10/2012 1257   CO2 22 02/17/2017 1424   CO2 29 09/20/2013 0949   GLUCOSE 98 02/17/2017 1424   GLUCOSE 75 09/20/2013 0949   GLUCOSE 88 09/10/2012 1257   BUN 15 02/17/2017 1424   BUN 22.2 09/20/2013 0949   CREATININE 0.73 02/17/2017 1424   CREATININE 1.0 09/20/2013 0949   CALCIUM 9.0 02/17/2017 1424   CALCIUM 9.7 09/20/2013 0949   PROT 7.5 02/17/2017 1424   PROT 8.5 (H) 09/20/2013 0949   ALBUMIN 3.6 02/17/2017 1424   ALBUMIN 3.6 09/20/2013 0949   AST 35 02/17/2017 1424   AST 35 (H) 09/20/2013 0949   ALT 23 02/17/2017 1424   ALT 17 09/20/2013 0949   ALKPHOS 30 (L) 02/17/2017 1424   ALKPHOS 39 (L) 09/20/2013 0949   BILITOT 0.5 02/17/2017 1424   BILITOT 0.31 09/20/2013 0949   GFRNONAA >60 02/17/2017 1424   GFRAA >60 02/17/2017 1424   Labs from [facility] 02/24/2017-BUN 15, regarding 0.7, calcium 8.9, albumin 3.7, serum M spike 2.3, WBC 2.8, heme a benign, platelets 291,000, ANC 1.27 Lambda free light chains 395 IgG 2290  01/20/2017 hemoglobin 9.9  Medications: I have reviewed the patient's current medications.  Assessment/Plan: 1. Indolent multiple myeloma, asymptomatic. No clinical evidence of disease progression. A metastatic bone survey in February 2016 was negative for lytic lesions. The serum M spike and serum free lambda light chains are stable 2. Mild anemia secondary to multiple myeloma-progressive 3. History of mild neutropenia, likely related to multiple myeloma.  4. History of coronary artery disease. 5. Osteopenia. 6. History of multiple urinary tract infections, followed by Dr. Diona Fanti. 7. "Raynaud" syndrome. 8. Chronic back pain. 9. History of hyponatremia. 10. Right foot surgery February 2014 11. Bilateral ovarian cysts-followed by GYN oncology    Disposition:  Her overall status appears unchanged. She has smoldering multiple myeloma. The serum M spike and free lambda light chains have not changed significantly over the past 8 months. The anemia has progressed, in part related to the recent hospital admission with blood draws and procedures.  I discussed observation versus treatment of the myeloma with Ms.  [name]. We decided to continue observation. She will have a CBC checked in 2 months with the result to be forwarded to [name]. She will return for an office visit and bone survey in 4 months.  I recommended she obtain an influenza vaccine.  25 minutes were spent with the patient today. The majority of the time was used for counseling and coordination of care.  Donneta Romberg, MD  03/03/2017  1:39 PM

## 2017-03-04 ENCOUNTER — Other Ambulatory Visit: Payer: Self-pay | Admitting: Obstetrics and Gynecology

## 2017-03-04 ENCOUNTER — Other Ambulatory Visit (HOSPITAL_COMMUNITY)
Admission: RE | Admit: 2017-03-04 | Discharge: 2017-03-04 | Disposition: A | Discharge status: Home / Self Care | Payer: PPO | Source: Ambulatory Visit | Attending: Obstetrics and Gynecology | Admitting: Obstetrics and Gynecology

## 2017-03-04 DIAGNOSIS — Z01419 Encounter for gynecological examination (general) (routine) without abnormal findings: Secondary | ICD-10-CM | POA: Diagnosis not present

## 2017-03-04 DIAGNOSIS — N870 Mild cervical dysplasia: Secondary | ICD-10-CM | POA: Diagnosis not present

## 2017-03-06 ENCOUNTER — Emergency Department (HOSPITAL_COMMUNITY): Payer: PPO

## 2017-03-06 ENCOUNTER — Emergency Department (HOSPITAL_COMMUNITY)
Admission: EM | Admit: 2017-03-06 | Discharge: 2017-03-06 | Disposition: A | Discharge status: Home / Self Care | Payer: PPO | Attending: Emergency Medicine | Admitting: Emergency Medicine

## 2017-03-06 ENCOUNTER — Encounter (HOSPITAL_COMMUNITY): Payer: Self-pay | Admitting: Emergency Medicine

## 2017-03-06 DIAGNOSIS — C9001 Multiple myeloma in remission: Secondary | ICD-10-CM | POA: Insufficient documentation

## 2017-03-06 DIAGNOSIS — M353 Polymyalgia rheumatica: Secondary | ICD-10-CM | POA: Diagnosis not present

## 2017-03-06 DIAGNOSIS — R072 Precordial pain: Secondary | ICD-10-CM | POA: Diagnosis not present

## 2017-03-06 DIAGNOSIS — I25711 Atherosclerosis of autologous vein coronary artery bypass graft(s) with angina pectoris with documented spasm: Secondary | ICD-10-CM | POA: Diagnosis not present

## 2017-03-06 DIAGNOSIS — R079 Chest pain, unspecified: Secondary | ICD-10-CM | POA: Diagnosis not present

## 2017-03-06 DIAGNOSIS — Z7982 Long term (current) use of aspirin: Secondary | ICD-10-CM | POA: Insufficient documentation

## 2017-03-06 DIAGNOSIS — R0789 Other chest pain: Secondary | ICD-10-CM | POA: Diagnosis not present

## 2017-03-06 DIAGNOSIS — I10 Essential (primary) hypertension: Secondary | ICD-10-CM | POA: Insufficient documentation

## 2017-03-06 DIAGNOSIS — E871 Hypo-osmolality and hyponatremia: Secondary | ICD-10-CM | POA: Diagnosis not present

## 2017-03-06 DIAGNOSIS — J45909 Unspecified asthma, uncomplicated: Secondary | ICD-10-CM | POA: Insufficient documentation

## 2017-03-06 DIAGNOSIS — E870 Hyperosmolality and hypernatremia: Secondary | ICD-10-CM | POA: Diagnosis not present

## 2017-03-06 LAB — BASIC METABOLIC PANEL
ANION GAP: 8 (ref 5–15)
BUN: 12 mg/dL (ref 6–20)
CALCIUM: 9.2 mg/dL (ref 8.9–10.3)
CO2: 26 mmol/L (ref 22–32)
CREATININE: 0.72 mg/dL (ref 0.44–1.00)
Chloride: 95 mmol/L — ABNORMAL LOW (ref 101–111)
GFR calc Af Amer: >60 mL/min (ref >60)
GFR calc non Af Amer: >60 mL/min (ref >60)
Glucose, Bld: 92 mg/dL (ref 65–99)
Potassium: 4.1 mmol/L (ref 3.5–5.1)
Sodium: 129 mmol/L — ABNORMAL LOW (ref 135–145)

## 2017-03-06 LAB — CBC
HCT: 27.9 % — ABNORMAL LOW (ref 36.0–46.0)
HEMOGLOBIN: 9.3 g/dL — AB (ref 12.0–15.0)
MCH: 32.5 pg (ref 26.0–34.0)
MCHC: 33.3 g/dL (ref 30.0–36.0)
MCV: 97.6 fL (ref 78.0–100.0)
PLATELETS: 268 10*3/uL (ref 150–400)
RBC: 2.86 MIL/uL — ABNORMAL LOW (ref 3.87–5.11)
RDW: 13.7 % (ref 11.5–15.5)
WBC: 4.3 10*3/uL (ref 4.0–10.5)

## 2017-03-06 LAB — I-STAT TROPONIN, ED: TROPONIN I, POC: 0 ng/mL (ref 0.00–0.08)

## 2017-03-06 NOTE — ED Provider Notes (Signed)
[facility] DEPT Provider Note   CSN: [id number] Arrival date & time: 03/06/17  1252     History   Chief Complaint Chief Complaint  Patient presents with  . Chest Pain    HPI [name] is a 83 y.o. female.  HPI Patient presents after an episode of chest pain. Patient is a nursing home resident. About 2 hours prior to ED arrival the patient felt sternal chest discomfort. She notes that she has multiple medical issues including recent stent placement. Since that time she has been taking all medication as directed including antiplatelet therapy. She was doing generally well until onset of symptoms earlier today. She specifies that she denies pain barely had discomfort across the anterior superior chest. There is no new dyspnea, no syncope, no vomiting, no confusion. Symptoms improved with rest, transfer here. Patient is here with her son who assists with the history of present illness. Past Medical History:  Diagnosis Date  . Allergic rhinitis   . Anxiety   . Arthritis   . Asthma   . Coronary artery disease   . Coronary atherosclerosis of autologous vein bypass graft   . Coronary atherosclerosis of unspecified type of vessel, native or graft   . GERD (gastroesophageal reflux disease)   . Hx of CABG   . Hypercholesteremia   . Hyperlipidemia    GOAL LDL <70  . Hypertension   . IBS (irritable bowel syndrome)   . Incontinent of urine   . Intermediate coronary syndrome ([place])   . Mild chronic anemia   . Multiple myeloma (HCC)    Dr. Benay Spice  . NSTEMI (non-ST elevated myocardial infarction) ([place]) 01/31/2017  . Osteopenia   . Ovarian cyst, bilateral    Rt complex  . Overactive bladder   . Panic disorder   . Raynauds syndrome   . Spinal stenosis of lumbar region   . Urine test positive for microalbuminuria 12/2011    Patient Active Problem List   Diagnosis Date Noted  . NSTEMI (non-ST elevated myocardial infarction) ([place]) 02/02/2017  . SOB (shortness of  breath) 09/02/2016  . Dizziness 09/02/2016  . Non-ST elevation (NSTEMI) myocardial infarction ([place]) 11/26/2015  . Polymyalgia rheumatica ([place]) 11/26/2015  . Hypokalemia 11/26/2015  . Mild persistent asthma 11/12/2015  . Coronary artery disease involving autologous artery coronary bypass graft with angina pectoris with documented spasm ([place]) 11/12/2015  . Multiple myeloma in remission ([place]) 11/12/2015  . Current use of beta blocker 11/12/2015  . Gastroesophageal reflux disease without esophagitis 11/12/2015  . Other allergic rhinitis 11/12/2015  . Weight loss, non-intentional 10/03/2015  . Asthma 09/24/2015  . Asthma in adult without complication 26/94/8546  . Seasonal allergic rhinitis due to pollen 09/24/2015  . Cervical radiculopathy 07/25/2015  . Chronic coronary artery disease 07/25/2015  . Coronary artery disease 07/25/2015  . Disturbance in sleep behavior 07/25/2015  . Elevated CK 07/25/2015  . Elevated creatine kinase level 07/25/2015  . Generalized osteoarthritis of hand 07/25/2015  . Other long term (current) drug therapy 07/25/2015  . Overactive bladder 07/25/2015  . Restless leg 07/25/2015  . Restless leg syndrome 07/25/2015  . Sleep disturbances 07/25/2015  . Idiopathic peripheral neuropathy 07/04/2015  . Lumbar radiculopathy, chronic 07/04/2015  . 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 Risk for falls 07/04/2015  . Sleep disturbance 07/04/2015  . Chest pain with moderate risk for cardiac etiology 06/13/2015  . Right sciatic nerve pain 10/19/2014  . Abnormal thyroid blood test 08/25/2014  . Chronic fatigue 08/09/2014  . Multiple myeloma ([place])  07/21/2013  . Depression 01/14/2013  . Generalized anxiety disorder 01/14/2013  . Gastroesophageal reflux disease 01/13/2013  . Cyst of ovary 12/17/2010  . KNEE PAIN, BILATERAL 08/15/2010  . Abnormal gait 08/15/2010  . Atherosclerosis of coronary artery bypass graft 10/11/2009  . CHEST PAIN, NON-CARDIAC 10/11/2009  . HIP PAIN, RIGHT  08/13/2009  . GREATER TROCHANTERIC BURSITIS 08/13/2009  . LUMBOSACRAL SPONDYLOSIS WITHOUT MYELOPATHY 11/10/2008  . Osteoarthritis of lumbosacral spine without myelopathy 11/10/2008  . FOOT PAIN, BILATERAL 06/30/2008  . Hyperlipidemia 06/22/2008  . Hypertension 06/22/2008  . Raynaud's disease 06/22/2008  . BURSITIS, LEFT SHOULDER 06/22/2008    Past Surgical History:  Procedure Laterality Date  . BUNIONECTOMY WITH HAMMERTOE RECONSTRUCTION Right 08/04/2012   Procedure:  HAMMERTOE RECONSTRUCTION;  Surgeon: Colin Rhein, MD;  Location: [facility];  Service: Orthopedics;  Laterality: Right;  HAMMER TOE RECONSTRUCTION PROBABLE FLEXOR DIGITORUM LONGUS TO PROXIMAL PHALANX TRANSFER LATERALIZED   . CAPSULOTOMY Right 08/04/2012   Procedure: CAPSULOTOMY;  Surgeon: Colin Rhein, MD;  Location: [facility];  Service: Orthopedics;  Laterality: Right;  RIGHT 2ND TOE METATARSOPHALANGEAL JOINT DORSAL CAPSULOTOMY   . CATARACT EXTRACTION, BILATERAL    . COLONOSCOPY    . CORONARY ANGIOPLASTY WITH STENT PLACEMENT  2014   2 stents  . CORONARY ARTERY BYPASS GRAFT  2007   LIMA to LAD, SVG to RCA  non-ST segment MI 08/2009 (Dr. Tamala Julian)  . CORONARY STENT INTERVENTION N/A 02/02/2017   Procedure: CORONARY STENT INTERVENTION;  Surgeon: Belva Crome, MD;  Location: [facility];  Service: Cardiovascular;  Laterality: N/A;  Prox CFX  . DILATION AND CURETTAGE OF UTERUS    . EYE SURGERY     both cataracts  . LEFT HEART CATH AND CORS/GRAFTS ANGIOGRAPHY N/A 02/02/2017   Procedure: LEFT HEART CATH AND CORS/GRAFTS ANGIOGRAPHY;  Surgeon: Belva Crome, MD;  Location: [facility];  Service: Cardiovascular;  Laterality: N/A;  . LEFT HEART CATHETERIZATION WITH CORONARY/GRAFT ANGIOGRAM N/A 07/09/2012   Procedure: LEFT HEART CATHETERIZATION WITH Beatrix Fetters;  Surgeon: Sinclair Grooms, MD;  Location[facility];  Service: Cardiovascular;  Laterality: N/A;  . TONSILLECTOMY  AND ADENOIDECTOMY    . UPPER GASTROINTESTINAL ENDOSCOPY      OB History    No data available       Home Medications    Prior to Admission medications   Medication Sig Start Date End Date Taking? Authorizing Provider  acetaminophen (TYLENOL) 650 MG CR tablet Take 1,300 mg by mouth 2 (two) times daily.   Yes [provider]  aspirin EC 81 MG tablet Take 1 tablet (81 mg total) by mouth daily. 01/21/16  Yes Minus Breeding, MD  atorvastatin (LIPITOR) 80 MG tablet Take 1 tablet (80 mg total) by mouth daily at 6 PM. 02/03/17  Yes Lyda Jester M, PA-C  calcium-vitamin D (OSCAL WITH D) 500-200 MG-UNIT tablet Take 1 tablet by mouth daily with breakfast.    Yes [provider]  carvedilol (COREG) 3.125 MG tablet Take 1 tablet (3.125 mg total) by mouth 2 (two) times daily with a meal. 02/11/17  Yes Barrett, Evelene Croon, PA-C  docusate sodium (COLACE) 100 MG capsule Take 1 capsule (100 mg total) by mouth daily as needed. Patient taking differently: Take 100 mg by mouth daily as needed for mild constipation.  02/03/17 02/03/18 Yes Simmons, Brittainy M, PA-C  donepezil (ARICEPT) 5 MG tablet Take 5 mg by mouth at bedtime.   Yes [provider]  famotidine (PEPCID) 40 MG tablet Take 40 mg by mouth 2 (two) times daily.    Yes [provider]  hydrALAZINE (APRESOLINE) 10 MG tablet Take 10 mg by mouth as needed. Take if systolic blood pressure is over 180   Yes [provider]  hydrALAZINE (APRESOLINE) 25 MG tablet Take 25 mg by mouth 2 (two) times daily.    Yes [provider]  iron polysaccharides (NIFEREX) 150 MG capsule Take 1 capsule (150 mg total) by mouth 2 (two) times daily. 02/03/17  Yes Lyda Jester M, PA-C  isosorbide mononitrate (IMDUR) 60 MG 24 hr tablet Take 60 mg by mouth daily.   Yes [provider]  levocetirizine (XYZAL) 5 MG tablet Take 1 tablet (5 mg total) by mouth every evening. 09/30/16  Yes Bardelas, Jose A, MD    LORazepam (ATIVAN) 0.5 MG tablet Take 0.5 mg by mouth 2 (two) times daily as needed for anxiety.  01/09/17  Yes [provider]  Melatonin 5 MG TABS Take 5 mg by mouth at bedtime.   Yes [provider]  memantine (NAMENDA) 10 MG tablet Take 10 mg by mouth 2 (two) times daily.    Yes [provider]  mirabegron ER (MYRBETRIQ) 25 MG TB24 tablet Take 25 mg by mouth every morning.    Yes [provider]  montelukast (SINGULAIR) 10 MG tablet Take 10 mg by mouth at bedtime.    Yes [provider]  Multiple Vitamin (MULITIVITAMIN WITH MINERALS) TABS Take 1 tablet by mouth daily.   Yes [provider]  nitroGLYCERIN (NITROSTAT) 0.4 MG SL tablet Place 1 tablet (0.4 mg total) under the tongue every 5 (five) minutes as needed for chest pain. Reported on 11/12/2015 02/03/17  Yes Lyda Jester M, PA-C  omeprazole (PRILOSEC) 20 MG capsule Take 20 mg by mouth 2 (two) times daily before a meal.   Yes [provider]  polyethylene glycol (MIRALAX / GLYCOLAX) packet Take 17 g by mouth daily as needed for mild constipation.   Yes [provider]  sertraline (ZOLOFT) 25 MG tablet Take 25 mg by mouth daily.    Yes [provider]  sertraline (ZOLOFT) 50 MG tablet Take 50 mg by mouth every evening.    Yes [provider]  ticagrelor (BRILINTA) 90 MG TABS tablet Take 1 tablet (90 mg total) by mouth 2 (two) times daily. 02/05/17  Yes Bhagat, Bhavinkumar, PA  traMADol (ULTRAM) 50 MG tablet Take 50 mg by mouth every 6 (six) hours as needed for moderate pain.   Yes [provider]    Family History Family History  Problem Relation Age of Onset  . Heart attack Mother   . CAD Mother   . Obesity Sister   . Cancer Paternal Aunt        breast  . Asthma Neg Hx   . Eczema Neg Hx   . Urticaria Neg Hx   . Allergic rhinitis Neg Hx   . Angioedema Neg Hx   . Atopy Neg Hx   . Immunodeficiency Neg Hx     Social History Social  History  Substance Use Topics  . Smoking status: Never Smoker  . Smokeless tobacco: Never Used  . Alcohol use Yes     Comment: occasional     Allergies   Butazolidin [phenylbutazone]; Cephalosporins; Codeine; Levaquin [levofloxacin]; Macrodantin; Morphine and related; Trimethoprim; Buprenorphine; Cilostazol; Clindamycin; Dexilant [dexlansoprazole]; Erythromycin; Morphine; Nitrofurantoin; Simvastatin; Sulfa antibiotics; Vesicare [solifenacin]; Clindamycin/lincomycin; Fesoterodine; Food; Ibuprofen; Lincomycin  hcl; Penicillins; Toviaz [fesoterodine fumarate er]; and Voltaren [diclofenac sodium]   Review of Systems Review of Systems  Constitutional:       Per HPI, otherwise negative  HENT:       Per HPI, otherwise negative  Respiratory:       Per HPI, otherwise negative  Cardiovascular:       Per HPI, otherwise negative  Gastrointestinal: Negative for vomiting.  Endocrine:       Negative aside from HPI  Genitourinary:       Neg aside from HPI   Musculoskeletal:       Per HPI, otherwise negative  Skin: Negative.   Neurological: Negative for syncope.     Physical Exam Updated Vital Signs BP (!) 158/87 (BP Location: Right Arm)   Pulse 64   Temp 98.1 F (36.7 C) (Oral)   Resp 10   SpO2 99%   Physical Exam  Constitutional: She is oriented to person, place, and time. She appears well-developed and well-nourished. No distress.  HENT:  Head: Normocephalic and atraumatic.  Eyes: Conjunctivae and EOM are normal.  Cardiovascular: Normal rate and regular rhythm.   Pulmonary/Chest: Effort normal and breath sounds normal. No stridor. No respiratory distress.  Abdominal: She exhibits no distension.  Musculoskeletal: She exhibits no edema.  Neurological: She is alert and oriented to person, place, and time. No cranial nerve deficit.  Skin: Skin is warm and dry.  Psychiatric: She has a normal mood and affect.  Nursing note and vitals reviewed.    ED Treatments / Results   Labs (all labs ordered are listed, but only abnormal results are displayed) Labs Reviewed  BASIC METABOLIC PANEL - Abnormal; Notable for the following:       Result Value   Sodium 129 (*)    Chloride 95 (*)    All other components within normal limits  CBC - Abnormal; Notable for the following:    RBC 2.86 (*)    Hemoglobin 9.3 (*)    HCT 27.9 (*)    All other components within normal limits  I-STAT TROPONIN, ED    EKG  EKG Interpretation  Date/Time:  Friday March 06 2017 13:09:35 EDT Ventricular Rate:  64 PR Interval:    QRS Duration: 84 QT Interval:  391 QTC Calculation: 404 R Axis:   38 Text Interpretation:  Sinus rhythm Left ventricular hypertrophy Abnormal ekg Confirmed by Carmin Muskrat [phone]) on 03/06/2017 1:21:51 PM       Radiology Dg Chest 2 View  Result Date: 03/06/2017 CLINICAL DATA:  Acute chest pain today. EXAM: CHEST  2 VIEW COMPARISON:  02/17/2017 and prior radiographs FINDINGS: Mild cardiomegaly and CABG changes again noted. There is no evidence of focal airspace disease, pulmonary edema, suspicious pulmonary nodule/mass, pleural effusion, or pneumothorax. No acute bony abnormalities are identified. IMPRESSION: Mild cardiomegaly without evidence of acute cardiopulmonary disease. Electronically Signed   By: Margarette Canada M.D.   On: 03/06/2017 15:32    Procedures Procedures (including critical care time)    Initial Impression / Assessment and Plan / ED Course  I have reviewed the triage vital signs and the nursing notes.  Pertinent labs & imaging results that were available during my care of the patient were reviewed by me and considered in my medical decision making (see chart for details).  3:38 PM Patient presents due to an episode of chest pain, and with a history of recent coronary intervention, there is evaluation to exclude ACS or other acute new pathology. Here,  no evidence for pneumonia, PE, pneumothorax, ACS. Patient is taking all  medication appropriate, is appropriately medically managed for her coronary disease.  Some evidence for hyponatremia, slightly below prior values, but no evidence for seizure, or other change in neurologic function. Patient received fluids here, will have repeat evaluation within 1 week to ensure improvement.   Final Clinical Impressions(s) / ED Diagnoses  Atypical chest pain Hyponatremia   Carmin Muskrat, MD 03/06/17 1539

## 2017-03-06 NOTE — ED Notes (Signed)
Patient made aware lunch tray ordered.

## 2017-03-06 NOTE — ED Triage Notes (Signed)
Patient presents to the ED from [facility] with complaints of dizziness and chest pressure. Patient reports she has stents placed 2 weeks ago and feels like the same pain. Patient denies any radiation reports pain just central chest. Patient alert and oriented on arrival.

## 2017-03-06 NOTE — ED Notes (Signed)
Family at bedside. 

## 2017-03-06 NOTE — Discharge Instructions (Signed)
As discussed, your evaluation today has been largely reassuring.  But, it is important that you monitor your condition carefully, and do not hesitate to return to the ED if you develop new, or concerning changes in your condition. ? ?Otherwise, please follow-up with your physician for appropriate ongoing care. ? ?

## 2017-03-09 DIAGNOSIS — R109 Unspecified abdominal pain: Secondary | ICD-10-CM | POA: Diagnosis not present

## 2017-03-10 DIAGNOSIS — M47897 Other spondylosis, lumbosacral region: Secondary | ICD-10-CM | POA: Diagnosis not present

## 2017-03-10 DIAGNOSIS — R5382 Chronic fatigue, unspecified: Secondary | ICD-10-CM | POA: Diagnosis not present

## 2017-03-10 LAB — CYTOLOGY - PAP
DIAGNOSIS: NEGATIVE
HPV (WINDOPATH): NOT DETECTED

## 2017-03-17 DIAGNOSIS — R1010 Upper abdominal pain, unspecified: Secondary | ICD-10-CM | POA: Diagnosis not present

## 2017-03-25 DIAGNOSIS — R1011 Right upper quadrant pain: Secondary | ICD-10-CM | POA: Diagnosis not present

## 2017-03-31 ENCOUNTER — Telehealth: Payer: Self-pay | Admitting: Cardiology

## 2017-03-31 NOTE — Telephone Encounter (Signed)
Left msg to call.

## 2017-03-31 NOTE — Telephone Encounter (Signed)
Returning your call. °

## 2017-03-31 NOTE — Telephone Encounter (Signed)
New Message  Pt c/o medication issue:  1. Name of Medication: Brilinta   2. How are you currently taking this medication (dosage and times per day)? Daughter does not know   3. Are you having a reaction (difficulty breathing--STAT)? SOB  4. What is your medication issue? Pt daughter would like to speak with RN about pt switching back to Plavix. Please call back to discuss

## 2017-04-01 ENCOUNTER — Telehealth: Payer: Self-pay | Admitting: Cardiology

## 2017-04-01 MED ORDER — CLOPIDOGREL BISULFATE 75 MG PO TABS: ORAL_TABLET | ORAL | 1 refills | Status: DC

## 2017-04-01 NOTE — Telephone Encounter (Signed)
Start Clopidogrel 75 mg daily with the final dose of Brilinta tonight. After tonight, DC brilinta. Be sure to over Clopidogral and Brilinta before stopping. Cl

## 2017-04-01 NOTE — Telephone Encounter (Signed)
Left message for pt to call.

## 2017-04-01 NOTE — Telephone Encounter (Signed)
Clarified with Erasmo Downer, PharmD regarding "over"? She states since stent is so new, would need loading dose of plavix to transition from brilinta to plavix. Last dose of brilinta should be tonight and should take plavix 75mg  x4 tablets (loading dose) tomorrow and then plavix 75mg  1 tablet daily thereafter.

## 2017-04-01 NOTE — Telephone Encounter (Signed)
Called daughter [name] and notified her of med change. LM for Luanna Cole, NP that antiplatelet has been changed. Notified nursing staff at [facility] that med change will need to happen - order to d/c brilinta and start plavix will need to be faxed to [phone]  Rx printed, signed by Dr. Percival Spanish, faxed

## 2017-04-01 NOTE — Telephone Encounter (Signed)
Cheryl RN( [facility]) Wants to get a clarification on Plavix to make sure its 4 Tab. Please Call. Thanks

## 2017-04-01 NOTE — Telephone Encounter (Signed)
Called patient to get verbal OK to speak with daughter [name]. She states [name] is her HCPOA  Patient reports SOB off and on. Was started on brilinta after cath+stent in August 2018. She also reports sleeping w/1 extra pillow. She reports she has new dx of mesenteric ischemia. She cannot take Brilinta with caffeinated beverage as she states she cannot drink caffeine d/t GERD. She advised I call [name] for follow up

## 2017-04-01 NOTE — Telephone Encounter (Signed)
Follow up     Pt returned call from yesterday

## 2017-04-01 NOTE — Telephone Encounter (Signed)
Returned call to [name], daughter. She states patient has a lot of SOB since starting Brilinta and she was previously on plavix and tolerated this well. Patient & daughter would like this medication changed.  Per [name], if med change is made - please call her and leave message if she does not answer.   New order for med would need to be called in assisted living Brett Canales, NP at [facility] for NP: [place]: [place] (first floor) assisted living nurse desk: [phone]  Routed to primary cardiologist Dr. Percival Spanish & interventionalist Dr. Tamala Julian for advice

## 2017-04-01 NOTE — Telephone Encounter (Signed)
Clarified with Malachy Mood, nurse at [facility], that patient is to take loading dose of plavix (4 tabs) on day 1 of med then 1 tab daily thereafter

## 2017-04-03 ENCOUNTER — Ambulatory Visit (INDEPENDENT_AMBULATORY_CARE_PROVIDER_SITE_OTHER): Payer: PPO | Admitting: Vascular Surgery

## 2017-04-03 ENCOUNTER — Encounter: Payer: Self-pay | Admitting: *Deleted

## 2017-04-03 ENCOUNTER — Other Ambulatory Visit: Payer: Self-pay | Admitting: *Deleted

## 2017-04-03 ENCOUNTER — Encounter: Payer: Self-pay | Admitting: Vascular Surgery

## 2017-04-03 VITALS — BP 141/62 | HR 64 | Temp 97.4°F | Resp 18 | Ht 63.0 in | Wt 99.8 lb

## 2017-04-03 DIAGNOSIS — K551 Chronic vascular disorders of intestine: Secondary | ICD-10-CM | POA: Diagnosis not present

## 2017-04-03 NOTE — Progress Notes (Signed)
Patient ID: [name] [name], female   DOB: Aug 01, 1931, 83 y.o.   MRN: [id number]  Reason for Consult: New Patient (Initial Visit) (eval mesenteric ischemia - ref by [name] [name])   Referred by [name] [name], [name]*  Subjective:     HPI:  [name] [name] is a 83 y.o. female with history of coronary artery disease status post CABG in the past most recently stenting now on aspirin Plavix and statin.  She also has a long history of abdominal pain with a diagnosis of both reflux and IBS followed by [name] [name] now at [name] [name].  She recently underwent mesenteric duplex which demonstrated elevated velocities but the celiac and superior mesenteric arteries.  She does have progressive weight loss now weighing less than 100 pounds previously over 110 1 year ago.  She describes her pain as occasional postprandial and feeling like a tightening around her midsection.  She does not have food fear.  She has never had intervention of her mesenteric arteries or bilateral lower extremities.  From a risk factor standpoint she is a former smoker but is nondiabetic does not have high blood pressure or high cholesterol.  Past Medical History:  Diagnosis Date  . Abnormal weight loss   . Allergic rhinitis   . Anxiety   . Arthritis   . Asthma   . Atherosclerotic heart disease of native coronary artery without angina pectoris   . Chest pain   . Chondrocostal junction syndrome   . Chronic fatigue   . Conjunctivochalasis    bilat  . Coronary artery disease   . Coronary atherosclerosis of autologous vein bypass graft   . Coronary atherosclerosis of unspecified type of vessel, native or graft   . Dementia    without Behavioral Disturbance  . GERD (gastroesophageal reflux disease)   . GERD (gastroesophageal reflux disease)   . Hx of CABG   . Hypercholesteremia   . Hyperlipidemia    GOAL LDL <70  . Hypertension   . IBS (irritable bowel syndrome)   . Incontinent of urine   . Intermediate  coronary syndrome ([name] [name])   . Long term (current) use of aspirin   . Major depressive disorder   . Mild chronic anemia   . Mixed incontinence   . Multiple myeloma (HCC)    [name] [name]  . Multiple myeloma (HCC)    not having achieved remission  . Muscle weakness (generalized)   . Myocardial infarction ([name] [name])   . NSTEMI (non-ST elevated myocardial infarction) ([name] [name]) 01/31/2017  . Osteopenia   . Ovarian cyst, bilateral    Rt complex  . Overactive bladder   . Overactive bladder   . Panic disorder   . Radiculopathy   . Raynaud's syndrome   . Raynauds syndrome   . Restless legs syndrome   . Seasonal allergic rhinitis   . Somnolence   . Spinal stenosis of lumbar region   . Tinea pedis   . Unstable angina pectoris ([name] [name])   . Unsteadiness on feet   . Urine test positive for microalbuminuria 12/2011   Family History  Problem Relation Age of Onset  . Heart attack Mother   . CAD Mother   . Obesity Sister   . Cancer Paternal Aunt        breast  . Asthma Neg Hx   . Eczema Neg Hx   . Urticaria Neg Hx   . Allergic rhinitis Neg Hx   . Angioedema Neg Hx   . Atopy Neg Hx   .  Immunodeficiency Neg Hx    Past Surgical History:  Procedure Laterality Date  . BUNIONECTOMY WITH HAMMERTOE RECONSTRUCTION Right 08/04/2012   Procedure:  HAMMERTOE RECONSTRUCTION;  Surgeon: Colin Rhein, MD;  Location: [place];  Service: Orthopedics;  Laterality: Right;  HAMMER TOE RECONSTRUCTION PROBABLE FLEXOR DIGITORUM LONGUS TO PROXIMAL PHALANX TRANSFER LATERALIZED   . CAPSULOTOMY Right 08/04/2012   Procedure: CAPSULOTOMY;  Surgeon: Colin Rhein, MD;  Location: [place];  Service: Orthopedics;  Laterality: Right;  RIGHT 2ND TOE METATARSOPHALANGEAL JOINT DORSAL CAPSULOTOMY   . CATARACT EXTRACTION, BILATERAL    . COLONOSCOPY    . CORONARY ANGIOPLASTY WITH STENT PLACEMENT  2014   2 stents  . CORONARY ARTERY BYPASS GRAFT  2007   LIMA to LAD, SVG to RCA  non-ST segment MI 08/2009  (Dr. Tamala Julian)  . CORONARY STENT INTERVENTION N/A 02/02/2017   Procedure: CORONARY STENT INTERVENTION;  Surgeon: Belva Crome, MD;  Location: [place] [facility];  Service: Cardiovascular;  Laterality: N/A;  Prox CFX  . DILATION AND CURETTAGE OF UTERUS    . EYE SURGERY     both cataracts  . LEFT HEART CATH AND CORS/GRAFTS ANGIOGRAPHY N/A 02/02/2017   Procedure: LEFT HEART CATH AND CORS/GRAFTS ANGIOGRAPHY;  Surgeon: Belva Crome, MD;  Location: [place] [facility];  Service: Cardiovascular;  Laterality: N/A;  . LEFT HEART CATHETERIZATION WITH CORONARY/GRAFT ANGIOGRAM N/A 07/09/2012   Procedure: LEFT HEART CATHETERIZATION WITH Beatrix Fetters;  Surgeon: Sinclair Grooms, MD;  Location: [facility];  Service: Cardiovascular;  Laterality: N/A;  . TONSILLECTOMY AND ADENOIDECTOMY    . UPPER GASTROINTESTINAL ENDOSCOPY      Short Social History:  Social History  Substance Use Topics  . Smoking status: Never Smoker  . Smokeless tobacco: Never Used  . Alcohol use Yes     Comment: occasional    Allergies  Allergen Reactions  . Butazolidin [Phenylbutazone] Swelling and Rash  . Cephalosporins Other (See Comments)    unknown  . Codeine Nausea Only  . Levaquin [Levofloxacin] Shortness Of Breath  . Macrodantin Other (See Comments)    Double vision  . Morphine And Related Other (See Comments)    hallucinations  . Trimethoprim Nausea Only  . Buprenorphine     From MAR  . Cilostazol Other (See Comments)    Stomach upset  . Clindamycin Other (See Comments)    diarrhea  . Dexilant [Dexlansoprazole] Diarrhea  . Erythromycin Nausea And Vomiting    All "mycin" drugs  . Morphine   . Nitrofurantoin     Other reaction(s): Other (See Comments) Double vision Other reaction(s): Other (See Comments) Double vision  . Simvastatin Other (See Comments)    Muscle aches Muscle aches  . Sulfa Antibiotics Other (See Comments)    unknown  . Vesicare [Solifenacin] Other (See Comments)    unknown   . Clindamycin/Lincomycin Other (See Comments)    Upset stomach  . Fesoterodine     Other reaction(s): Other (See Comments) Other reaction(s): Other (See Comments) Pt. Unsure of reaction unknown Pt. Unsure of reaction  . Food Other (See Comments)    No spicy foods or black pepper, raw onions - causes gerd  . Ibuprofen Hives and Rash  . Lincomycin Hcl Other (See Comments)    Upset stomach  . Penicillins Rash    Has patient had a PCN reaction causing immediate rash, facial/tongue/throat swelling, SOB or lightheadedness with hypotension: Yes Has patient had a PCN reaction causing severe  rash involving mucus membranes or skin necrosis: No Has patient had a PCN reaction that required hospitalization No Has patient had a PCN reaction occurring within the last 10 years: No If all of the above answers are "NO", then may proceed with Cephalosporin use.   Lisbeth Ply [Fesoterodine Fumarate Er] Other (See Comments)    unknown  . Voltaren [Diclofenac Sodium] Rash    Current Outpatient Prescriptions  Medication Sig Dispense Refill  . acetaminophen (TYLENOL) 650 MG CR tablet Take 1,300 mg by mouth 2 (two) times daily.    Marland Kitchen aspirin EC 81 MG tablet Take 1 tablet (81 mg total) by mouth daily. 90 tablet 3  . atorvastatin (LIPITOR) 80 MG tablet Take 1 tablet (80 mg total) by mouth daily at 6 PM. 30 tablet 5  . calcium-vitamin D (OSCAL WITH D) 500-200 MG-UNIT tablet Take 1 tablet by mouth daily with breakfast.     . carvedilol (COREG) 3.125 MG tablet Take 1 tablet (3.125 mg total) by mouth 2 (two) times daily with a meal. 60 tablet 3  . clopidogrel (PLAVIX) 75 MG tablet Take last dose of Brilinta 04/01/17 in evening. Take 4 tablets of Clopidogrel on 04/02/17 then take 1 tablet by mouth daily thereafter. 94 tablet 1  . docusate sodium (COLACE) 100 MG capsule Take 1 capsule (100 mg total) by mouth daily as needed. (Patient taking differently: Take 100 mg by mouth daily as needed for mild constipation. ) 30  capsule 2  . hydrALAZINE (APRESOLINE) 25 MG tablet Take 25 mg by mouth 2 (two) times daily.     . iron polysaccharides (NU-IRON) 150 MG capsule Take 150 mg by mouth daily.    . isosorbide mononitrate (IMDUR) 60 MG 24 hr tablet Take 60 mg by mouth daily.    Marland Kitchen levocetirizine (XYZAL) 5 MG tablet Take 1 tablet (5 mg total) by mouth every evening. 30 tablet 5  . LORazepam (ATIVAN) 0.5 MG tablet Take 0.5 mg by mouth 2 (two) times daily as needed for anxiety.     . Melatonin 5 MG TABS Take 5 mg by mouth at bedtime.    . memantine (NAMENDA) 10 MG tablet Take 10 mg by mouth 2 (two) times daily.     . mirabegron ER (MYRBETRIQ) 25 MG TB24 tablet Take 25 mg by mouth every morning.     . montelukast (SINGULAIR) 10 MG tablet Take 10 mg by mouth at bedtime.     . Multiple Vitamin (MULITIVITAMIN WITH MINERALS) TABS Take 1 tablet by mouth daily.    . nitroGLYCERIN (NITROSTAT) 0.4 MG SL tablet Place 1 tablet (0.4 mg total) under the tongue every 5 (five) minutes as needed for chest pain. Reported on 11/12/2015 25 tablet 2  . omeprazole (PRILOSEC) 20 MG capsule Take 20 mg by mouth 2 (two) times daily before a meal.    . polyethylene glycol (MIRALAX / GLYCOLAX) packet Take 17 g by mouth daily as needed for mild constipation.    . sertraline (ZOLOFT) 50 MG tablet Take 50 mg by mouth every evening.     . donepezil (ARICEPT) 5 MG tablet Take 5 mg by mouth at bedtime.    . famotidine (PEPCID) 40 MG tablet Take 40 mg by mouth 2 (two) times daily.     . traMADol (ULTRAM) 50 MG tablet Take 50 mg by mouth every 6 (six) hours as needed for moderate pain.     No current facility-administered medications for this visit.     Review of Systems  Constitutional:  Constitutional negative. HENT: HENT negative.  Eyes: Eyes negative.  Respiratory: Positive for shortness of breath.  Cardiovascular: Positive for leg swelling.  GI: Positive for abdominal pain.  Musculoskeletal: Musculoskeletal negative.  Skin: Skin negative.    Neurological: Neurological negative. Hematologic: Hematologic/lymphatic negative.  Psychiatric: Psychiatric negative.        Objective:  Objective   Vitals:   04/03/17 1034  BP: (!) 141/62  Pulse: 64  Resp: 18  Temp: (!) 97.4 F (36.3 C)  TempSrc: Oral  SpO2: 100%  Weight: 99 lb 12.8 oz (45.3 kg)  Height: '5\' 3"'  (1.6 m)   Body mass index is 17.68 kg/m.  Physical Exam  Constitutional: She is oriented to person, place, and time. She appears well-developed.  HENT:  Head: Normocephalic.  Eyes: Pupils are equal, round, and reactive to light.  Neck: Normal range of motion.  Cardiovascular: Normal rate.   Pulses:      Carotid pulses are 2+ on the right side, and 2+ on the left side.      Radial pulses are 2+ on the right side, and 2+ on the left side.       Femoral pulses are 2+ on the right side, and 2+ on the left side. Pulmonary/Chest: Effort normal.  Abdominal: Soft. She exhibits no mass.  Musculoskeletal: Normal range of motion. She exhibits no edema.  Neurological: She is alert and oriented to person, place, and time.  Skin: Skin is warm and dry.  Psychiatric: She has a normal mood and affect. Her behavior is normal. Judgment and thought content normal.    Data: I reviewed the results of her mesenteric duplex performed earlier this month which demonstrated velocities of the celiac artery 295 and the SMA 466cm/s.     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  I discussed with her that while she does have weight loss and postprandial pain it may not be from mesenteric ischemia.  Proceeding with intervention endovascularly would present risks of distal embolization of calcifications and of course groin complications as well and these have been expressed to her.  At best we could hope for a 50-50 chance of making her pain better and would not be immediate most likely.  I would also  like her to be on Plavix at least for 3 months if we place a stent.  I would not think she is a candidate for an open mesenteric bypass.  We have discussed the risk benefits and expected outcomes and she agrees to proceed we will schedule her for aortogram with possible mesenteric stenting via a right femoral approach in the near future.     Waynetta Sandy MD [facility]

## 2017-04-15 ENCOUNTER — Other Ambulatory Visit: Payer: Self-pay | Admitting: *Deleted

## 2017-04-15 ENCOUNTER — Ambulatory Visit (HOSPITAL_COMMUNITY)
Admission: RE | Admit: 2017-04-15 | Discharge: 2017-04-15 | Disposition: A | Discharge status: Home / Self Care | Payer: PPO | Source: Ambulatory Visit | Attending: Vascular Surgery | Admitting: Vascular Surgery

## 2017-04-15 ENCOUNTER — Encounter: Payer: Self-pay | Admitting: Vascular Surgery

## 2017-04-15 ENCOUNTER — Encounter (HOSPITAL_COMMUNITY)
Admission: RE | Disposition: A | Discharge status: Home / Self Care | Payer: Self-pay | Source: Ambulatory Visit | Attending: Vascular Surgery

## 2017-04-15 ENCOUNTER — Encounter (HOSPITAL_COMMUNITY): Payer: Self-pay | Admitting: Vascular Surgery

## 2017-04-15 DIAGNOSIS — I701 Atherosclerosis of renal artery: Secondary | ICD-10-CM | POA: Insufficient documentation

## 2017-04-15 DIAGNOSIS — R109 Unspecified abdominal pain: Secondary | ICD-10-CM | POA: Insufficient documentation

## 2017-04-15 HISTORY — PX: VISCERAL ANGIOGRAPHY: CATH118276

## 2017-04-15 LAB — POCT I-STAT, CHEM 8
BUN: 19 mg/dL (ref 6–20)
CALCIUM ION: 1.12 mmol/L — AB (ref 1.15–1.40)
CHLORIDE: 99 mmol/L — AB (ref 101–111)
Creatinine, Ser: 0.6 mg/dL (ref 0.44–1.00)
Glucose, Bld: 92 mg/dL (ref 65–99)
HCT: 30 % — ABNORMAL LOW (ref 36.0–46.0)
Hemoglobin: 10.2 g/dL — ABNORMAL LOW (ref 12.0–15.0)
POTASSIUM: 4.3 mmol/L (ref 3.5–5.1)
SODIUM: 134 mmol/L — AB (ref 135–145)
TCO2: 29 mmol/L (ref 22–32)

## 2017-04-15 SURGERY — VISCERAL ANGIOGRAPHY
Anesthesia: LOCAL

## 2017-04-15 MED ORDER — SODIUM CHLORIDE 0.9 % WEIGHT BASED INFUSION: 1.0000 mL/kg/h | INTRAVENOUS | Status: DC

## 2017-04-15 MED ORDER — LABETALOL HCL 5 MG/ML IV SOLN
10.0000 mg | INTRAVENOUS | Status: DC | PRN
  Administered 2017-04-15: 10 mg via INTRAVENOUS

## 2017-04-15 MED ORDER — SODIUM CHLORIDE 0.9% FLUSH: 3.0000 mL | Freq: Two times a day (BID) | INTRAVENOUS | Status: DC

## 2017-04-15 MED ORDER — HEPARIN (PORCINE) IN NACL 2-0.9 UNIT/ML-% IJ SOLN
INTRAMUSCULAR | Status: AC | PRN
  Administered 2017-04-15: 1000 mL

## 2017-04-15 MED ORDER — FENTANYL CITRATE (PF) 100 MCG/2ML IJ SOLN
INTRAMUSCULAR | Status: DC | PRN
  Administered 2017-04-15: 25 ug via INTRAVENOUS

## 2017-04-15 MED ORDER — OXYCODONE HCL 5 MG PO TABS: 5.0000 mg | ORAL_TABLET | ORAL | Status: DC | PRN

## 2017-04-15 MED ORDER — LIDOCAINE HCL (PF) 1 % IJ SOLN
INTRAMUSCULAR | Status: AC
  Filled 2017-04-15: qty 30

## 2017-04-15 MED ORDER — LABETALOL HCL 5 MG/ML IV SOLN
INTRAVENOUS | Status: AC
Start: 2017-04-15 — End: ?
  Filled 2017-04-15: qty 4

## 2017-04-15 MED ORDER — FENTANYL CITRATE (PF) 100 MCG/2ML IJ SOLN
INTRAMUSCULAR | Status: AC
  Filled 2017-04-15: qty 2

## 2017-04-15 MED ORDER — HYDROMORPHONE HCL 1 MG/ML IJ SOLN: 0.5000 mg | INTRAMUSCULAR | Status: DC | PRN

## 2017-04-15 MED ORDER — SODIUM CHLORIDE 0.9 % IV SOLN
INTRAVENOUS | Status: DC
  Administered 2017-04-15: 08:00:00 via INTRAVENOUS

## 2017-04-15 MED ORDER — IODIXANOL 320 MG/ML IV SOLN
INTRAVENOUS | Status: DC | PRN
  Administered 2017-04-15: 80 mL via INTRAVENOUS

## 2017-04-15 MED ORDER — SODIUM CHLORIDE 0.9% FLUSH: 3.0000 mL | INTRAVENOUS | Status: DC | PRN

## 2017-04-15 MED ORDER — MIDAZOLAM HCL 2 MG/2ML IJ SOLN
INTRAMUSCULAR | Status: DC | PRN
  Administered 2017-04-15: 0.5 mg via INTRAVENOUS

## 2017-04-15 MED ORDER — HYDRALAZINE HCL 20 MG/ML IJ SOLN
INTRAMUSCULAR | Status: AC
  Filled 2017-04-15: qty 1

## 2017-04-15 MED ORDER — HYDRALAZINE HCL 20 MG/ML IJ SOLN
INTRAMUSCULAR | Status: DC | PRN
  Administered 2017-04-15: 10 mg via INTRAVENOUS

## 2017-04-15 MED ORDER — HYDRALAZINE HCL 20 MG/ML IJ SOLN: 5.0000 mg | INTRAMUSCULAR | Status: DC | PRN

## 2017-04-15 MED ORDER — SODIUM CHLORIDE 0.9 % IV SOLN: 250.0000 mL | INTRAVENOUS | Status: DC | PRN

## 2017-04-15 MED ORDER — HEPARIN (PORCINE) IN NACL 2-0.9 UNIT/ML-% IJ SOLN
INTRAMUSCULAR | Status: AC
  Filled 2017-04-15: qty 1000

## 2017-04-15 MED ORDER — LIDOCAINE HCL (PF) 1 % IJ SOLN
INTRAMUSCULAR | Status: DC | PRN
  Administered 2017-04-15: 5 mL

## 2017-04-15 MED ORDER — MIDAZOLAM HCL 2 MG/2ML IJ SOLN
INTRAMUSCULAR | Status: AC
  Filled 2017-04-15: qty 2

## 2017-04-15 SURGICAL SUPPLY — 10 items
CATH OMNI FLUSH 5F 65CM (CATHETERS) ×1 IMPLANT
COVER PRB 48X5XTLSCP FOLD TPE (BAG) IMPLANT
COVER PROBE 5X48 (BAG) ×2
KIT MICROINTRODUCER STIFF 5F (SHEATH) ×1 IMPLANT
KIT PV (KITS) ×2 IMPLANT
SHEATH PINNACLE 6F 10CM (SHEATH) ×1 IMPLANT
SYR MEDRAD MARK V 150ML (SYRINGE) ×2 IMPLANT
TRANSDUCER W/STOPCOCK (MISCELLANEOUS) ×2 IMPLANT
TRAY PV CATH (CUSTOM PROCEDURE TRAY) ×2 IMPLANT
WIRE BENTSON .035X145CM (WIRE) ×1 IMPLANT

## 2017-04-15 NOTE — Progress Notes (Signed)
Site area: Right groin 6 french arterial sheath was removed  Site Prior to Removal:  Level 1- Bruise  Pressure Applied For 20 MINUTES    Bedrest Beginning at 0945p Manual:   Yes.    Patient Status During Pull:  stable  Post Pull Groin Site:  Level 1- bruise  Post Pull Instructions Given:  Yes.    Post Pull Pulses Present:  Yes.    Dressing Applied:  Yes.    Comments:  BP med given

## 2017-04-15 NOTE — Discharge Instructions (Signed)

## 2017-04-15 NOTE — H&P (Signed)
   History and Physical Update  The patient was interviewed and re-examined.  The patient's previous History and Physical has been reviewed and is unchanged from recent office visit. Plan for visceral angiography with possible intervention.  Brandon C. Donzetta Matters, MD [facility] [place] Office: [phone] Pager: [phone]  04/15/2017, 7:34 AM

## 2017-04-15 NOTE — Op Note (Signed)
    Patient name: [name] MRN: [id number] DOB: Feb 17, 1932 Sex: female  04/15/2017 Pre-operative Diagnosis: abdominal pain Post-operative diagnosis:  Same Surgeon:  Erlene Quan C. Donzetta Matters, MD Procedure Performed: 1.  US guided cannulation of right common femoral artery 2.  Aortogram 3.  Moderate sedation with fentanyl and versed for 7 minutes   Indications:  83yo female with history of abdominal pain. Duplex demonstrated stenoses of both the celiac and superior mesenteric arteries.   Findings: There is a 60% renal artery stenosis on the left. There is no discernible celiac or SMA arterial stenosis.    Procedure:  The patient was identified in the holding area and taken to room 8.  The patient was then placed supine on the table and prepped and draped in the usual sterile fashion.  A time out was called.  Ultrasound was used to evaluate the right common femoral artery.  It was patent .  A digital ultrasound image was acquired.  A micropuncture needle was used to access the right common femoral artery under ultrasound guidance.  An 018 wire was advanced without resistance and a micropuncture sheath was placed.  The 018 wire was removed and a benson wire was placed.  The micropuncture sheath was exchanged for a 5 french sheath.  An omniflush catheter was advanced over the wire to the level of T12 and abdominal aortogram was obtained. We then performed several other views with angle and could not demonstrate any stenosis of the visceral vessels. With this we elected to terminate the procedure the catheter was removed over a wire and sheath will be pulled and postop holding. Patient tolerated procedure well without immediate complication.   Contrast: 80cc  Jeyson Deshotel C. Donzetta Matters, MD [facility] [place] Office: [phone] Pager: [phone]

## 2017-04-16 ENCOUNTER — Other Ambulatory Visit: Payer: Self-pay | Admitting: *Deleted

## 2017-04-16 DIAGNOSIS — R1084 Generalized abdominal pain: Secondary | ICD-10-CM

## 2017-04-16 NOTE — Progress Notes (Unsigned)
Daughter requested a referral to Dr. Carlean Purl for a second opinion after Dr. Donzetta Matters performed aortogram.

## 2017-04-18 DIAGNOSIS — R350 Frequency of micturition: Secondary | ICD-10-CM | POA: Diagnosis not present

## 2017-04-20 ENCOUNTER — Ambulatory Visit (INDEPENDENT_AMBULATORY_CARE_PROVIDER_SITE_OTHER): Payer: PPO | Admitting: Diagnostic Neuroimaging

## 2017-04-20 ENCOUNTER — Encounter: Payer: Self-pay | Admitting: Diagnostic Neuroimaging

## 2017-04-20 VITALS — BP 136/67 | HR 66 | Ht 63.0 in | Wt 100.6 lb

## 2017-04-20 DIAGNOSIS — F0390 Unspecified dementia without behavioral disturbance: Secondary | ICD-10-CM

## 2017-04-20 DIAGNOSIS — R413 Other amnesia: Secondary | ICD-10-CM | POA: Diagnosis not present

## 2017-04-20 DIAGNOSIS — F03A0 Unspecified dementia, mild, without behavioral disturbance, psychotic disturbance, mood disturbance, and anxiety: Secondary | ICD-10-CM

## 2017-04-20 NOTE — Progress Notes (Signed)
[facility]  PATIENT: [name] DOB: [birth year]  REFERRING CLINICIAN: Antony Salmon HISTORY FROM: patient, daughter, chart review REASON FOR VISIT: new consult    HISTORICAL  CHIEF COMPLAINT:  Chief Complaint  Patient presents with  . NP  Antony Salmon  . Dementia    Lives at [facility],     HISTORY OF PRESENT ILLNESS:   83 year old right-handed female with history of hypertension, coronary artery bypass graft surgery 2007, indolent multiple myeloma, here for evaluation of dementia.  The patient reports some short-term memory problems and confusion since 2016.  According to patient's daughter symptoms started around 2014.  Patient sought medical attention and neurology consultation in 2016.  At that time MRI of the brain showed right frontal ischemic infarction (chronic) and mild chronic small vessel ischemic disease.  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  She was started on Aricept at that time.  In 2018 she was started on Namenda.  At some point her Aricept was discontinued due to GI symptoms, which she is getting worked up.  Symptoms have gradually worsened over time.  She is having problems with short-term memory, recall, organizing her thoughts, recalling events, remembering tasks.  She needs more help with complex financial transactions.  Needs more help with remembering how to take her medications.  Patient moved into [facility] independent living in 2010.  In 2017 she transferred to assisted living.  Patient does have some anxiety and decreased energy symptoms.   REVIEW OF SYSTEMS: Full 14 system review of systems performed and negative with exception of: Weight loss fatigue swelling in legs incontinence double vision easy bruising allergies runny nose memory loss sleepiness.  ALLERGIES: Allergies  Allergen Reactions  . Butazolidin [Phenylbutazone] Swelling and Rash  . Cephalosporins Other (See Comments)    unknown  . Codeine  Nausea Only  . Levaquin [Levofloxacin] Shortness Of Breath  . Macrodantin Other (See Comments)    Double vision  . Morphine And Related Other (See Comments)    hallucinations  . Trimethoprim Nausea Only  . Buprenorphine     From MAR  . Cilostazol Other (See Comments)    Stomach upset  . Dexilant [Dexlansoprazole] Diarrhea  . Diclofenac     Documented on MAR  . Erythromycin Nausea And Vomiting    All "mycin" drugs  . Morphine   . Nitrofurantoin     Double vision  . Simvastatin Other (See Comments)    Muscle aches   . Sulfa Antibiotics Other (See Comments)    unknown  . Vesicare [Solifenacin] Other (See Comments)    unknown  . Clindamycin/Lincomycin Other (See Comments)    Upset stomach  . Food Other (See Comments)    No spicy foods or black pepper, raw onions - causes gerd  . Ibuprofen Hives and Rash  . Lincomycin Hcl Other (See Comments)    Upset stomach  . Penicillins Rash    Has patient had a PCN reaction causing immediate rash, facial/tongue/throat swelling, SOB or lightheadedness with hypotension: Yes Has patient had a PCN reaction causing severe rash involving mucus membranes or skin necrosis: No Has patient had a PCN reaction that required hospitalization No Has patient had a PCN reaction occurring within the last 10 years: No If all of the above answers are "NO", then may proceed with Cephalosporin use.   Lisbeth Ply [Fesoterodine Fumarate Er] Other (See Comments)    unknown  . Voltaren [Diclofenac Sodium] Rash    HOME MEDICATIONS: Outpatient Medications Prior to Visit  Medication Sig Dispense Refill  . acetaminophen (TYLENOL) 650 MG CR tablet Take 1,300 mg by mouth 2 (two) times daily.    Marland Kitchen aspirin EC 81 MG tablet Take 1 tablet (81 mg total) by mouth daily. 90 tablet 3  . atorvastatin (LIPITOR) 80 MG tablet Take 1 tablet (80 mg total) by mouth daily at 6 PM. 30 tablet 5  . Calcium Carb-Cholecalciferol (CALCIUM 500 + D3) 500-600 MG-UNIT TABS Take 1 tablet by  mouth daily.    . carvedilol (COREG) 3.125 MG tablet Take 1 tablet (3.125 mg total) by mouth 2 (two) times daily with a meal. 60 tablet 3  . clopidogrel (PLAVIX) 75 MG tablet Take last dose of Brilinta 04/01/17 in evening. Take 4 tablets of Clopidogrel on 04/02/17 then take 1 tablet by mouth daily thereafter. (Patient taking differently: Take 75 mg by mouth daily. ) 94 tablet 1  . docusate sodium (COLACE) 100 MG capsule Take 1 capsule (100 mg total) by mouth daily as needed. (Patient taking differently: Take 100 mg by mouth daily. May take an additional 100 mg as needed for constipation) 30 capsule 2  . hydrALAZINE (APRESOLINE) 10 MG tablet Take 10 mg by mouth daily as needed (SBP >180).    . hydrALAZINE (APRESOLINE) 25 MG tablet Take 25 mg by mouth 2 (two) times daily.     . iron polysaccharides (NU-IRON) 150 MG capsule Take 150 mg by mouth 2 (two) times daily.     . isosorbide mononitrate (IMDUR) 60 MG 24 hr tablet Take 60 mg by mouth daily.    Marland Kitchen levocetirizine (XYZAL) 5 MG tablet Take 1 tablet (5 mg total) by mouth every evening. (Patient taking differently: Take 5 mg by mouth at bedtime. ) 30 tablet 5  . LORazepam (ATIVAN) 0.5 MG tablet Take 0.25-0.5 mg by mouth 2 (two) times daily. Take 0.25 mg in the morning and 0.'5mg'$  at bedtime.  May take an additional 0.5 mg twice daily as needed for anxiety    . Melatonin 5 MG TABS Take 5 mg by mouth at bedtime.    . memantine (NAMENDA) 10 MG tablet Take 10 mg by mouth 2 (two) times daily.     . mirabegron ER (MYRBETRIQ) 25 MG TB24 tablet Take 25 mg by mouth every morning.     . montelukast (SINGULAIR) 10 MG tablet Take 10 mg by mouth at bedtime.     . Multiple Vitamins-Minerals (CENTRUM SILVER 50+WOMEN) TABS Take 1 tablet by mouth daily.    . nitroGLYCERIN (NITROSTAT) 0.4 MG SL tablet Place 1 tablet (0.4 mg total) under the tongue every 5 (five) minutes as needed for chest pain. Reported on 11/12/2015 25 tablet 2  . omeprazole (PRILOSEC) 20 MG capsule Take 20  mg by mouth 2 (two) times daily before a meal.    . polyethylene glycol (MIRALAX / GLYCOLAX) packet Take 17 g by mouth daily as needed for mild constipation.    . sertraline (ZOLOFT) 25 MG tablet Take daily by mouth. Takes '25mg'$  po AM and '50mg'$  po PM.    . sertraline (ZOLOFT) 50 MG tablet Take 25 mg every evening by mouth.      No facility-administered medications prior to visit.     PAST MEDICAL HISTORY: Past Medical History:  Diagnosis Date  . Abnormal weight loss   . Allergic rhinitis   . Anxiety   . Arthritis   . Asthma   . Atherosclerotic heart disease of native coronary artery without angina pectoris   . Chest  pain   . Chondrocostal junction syndrome   . Chronic fatigue   . Conjunctivochalasis    bilat  . Coronary artery disease   . Coronary atherosclerosis of autologous vein bypass graft   . Coronary atherosclerosis of unspecified type of vessel, native or graft   . Dementia    without Behavioral Disturbance  . GERD (gastroesophageal reflux disease)   . GERD (gastroesophageal reflux disease)   . Hx of CABG   . Hypercholesteremia   . Hyperlipidemia    GOAL LDL <70  . Hypertension   . IBS (irritable bowel syndrome)   . Incontinent of urine   . Intermediate coronary syndrome ([place])   . Long term (current) use of aspirin   . Major depressive disorder   . Mild chronic anemia   . Mixed incontinence   . Multiple myeloma (HCC)    Dr. Benay Spice  . Multiple myeloma (HCC)    not having achieved remission  . Muscle weakness (generalized)   . Myocardial infarction ([place])   . NSTEMI (non-ST elevated myocardial infarction) ([place]) 01/31/2017  . Osteopenia   . Ovarian cyst, bilateral    Rt complex  . Overactive bladder   . Overactive bladder   . Panic disorder   . Radiculopathy   . Raynaud's syndrome   . Raynauds syndrome   . Restless legs syndrome   . Seasonal allergic rhinitis   . Somnolence   . Spinal stenosis of lumbar region   . Tinea pedis   . Unstable angina  pectoris ([place])   . Unsteadiness on feet   . Urine test positive for microalbuminuria 12/2011    PAST SURGICAL HISTORY: Past Surgical History:  Procedure Laterality Date  . CATARACT EXTRACTION, BILATERAL    . COLONOSCOPY    . CORONARY ANGIOPLASTY WITH STENT PLACEMENT  2014   2 stents  . CORONARY ARTERY BYPASS GRAFT  2007   LIMA to LAD, SVG to RCA  non-ST segment MI 08/2009 (Dr. Tamala Julian)  . DILATION AND CURETTAGE OF UTERUS    . EYE SURGERY     both cataracts  . TONSILLECTOMY AND ADENOIDECTOMY    . UPPER GASTROINTESTINAL ENDOSCOPY      FAMILY HISTORY: Family History  Problem Relation Age of Onset  . Heart attack Mother   . CAD Mother   . Obesity Sister   . Cancer Paternal Aunt        breast  . Asthma Neg Hx   . Eczema Neg Hx   . Urticaria Neg Hx   . Allergic rhinitis Neg Hx   . Angioedema Neg Hx   . Atopy Neg Hx   . Immunodeficiency Neg Hx     SOCIAL HISTORY:  Social History   Socioeconomic History  . Marital status: Married    Spouse name: Not on file  . Number of children: Not on file  . Years of education: Not on file  . Highest education level: Not on file  Social Needs  . Financial resource strain: Not on file  . Food insecurity - worry: Not on file  . Food insecurity - inability: Not on file  . Transportation needs - medical: Not on file  . Transportation needs - non-medical: Not on file  Occupational History  . Not on file  Tobacco Use  . Smoking status: Never Smoker  . Smokeless tobacco: Never Used  Substance and Sexual Activity  . Alcohol use: Yes    Comment: occasional  . Drug use: No  . Sexual activity:  No  Other Topics Concern  . Not on file  Social History Narrative  . Not on file     PHYSICAL EXAM  GENERAL EXAM/CONSTITUTIONAL: Vitals:  Vitals:   04/20/17 1101  BP: 136/67  Pulse: 66  Weight: 100 lb 9.6 oz (45.6 kg)  Height: _0  (1.6 m)     Body mass index is 17.82 kg/m.  No exam data present  Patient is in no distress;  well developed, nourished and groomed; neck is supple  CARDIOVASCULAR:  Examination of carotid arteries is normal; no carotid bruits  Regular rate and rhythm, no murmurs  Examination of peripheral vascular system by observation and palpation is normal  EYES:  Ophthalmoscopic exam of optic discs and posterior segments is normal; no papilledema or hemorrhages  MUSCULOSKELETAL:  Gait, strength, tone, movements noted in Neurologic exam below  NEUROLOGIC: MENTAL STATUS:  MMSE - Mini Mental State Exam 04/20/2017  Orientation to time 5  Orientation to Place 5  Registration 3  Attention/ Calculation 3  Recall 3  Language- name 2 objects 2  Language- repeat 1  Language- follow 3 step command 3  Language- read & follow direction 1  Write a sentence 1  Copy design 0  Total score 27    awake, alert, oriented to person, place and time  recent and remote memory intact  normal attention and concentration  language fluent, comprehension intact, naming intact,   fund of knowledge appropriate  CRANIAL NERVE:   2nd - no papilledema on fundoscopic exam  2nd, 3rd, 4th, 6th - pupils equal and reactive to light, visual fields full to confrontation, extraocular muscles intact, no nystagmus  5th - facial sensation symmetric  7th - facial strength symmetric  8th - hearing intact  9th - palate elevates symmetrically, uvula midline  11th - shoulder shrug symmetric  12th - tongue protrusion midline  MOTOR:   normal bulk and tone, full strength in the BUE, BLE  SENSORY:   normal and symmetric to light touch, temperature, vibration  COORDINATION:   finger-nose-finger, fine finger movements normal  REFLEXES:   deep tendon reflexes TRACE and symmetric  NEG SNOUT, PALMOMENTAL, MYERSONS REFLEXES  GAIT/STATION:   narrow based gait; romberg is negative    DIAGNOSTIC DATA (LABS, IMAGING, TESTING) - I reviewed patient records, labs, notes, testing and imaging myself  where available.  Lab Results  Component Value Date   WBC 4.3 03/06/2017   HGB 10.2 (L) 04/15/2017   HCT 30.0 (L) 04/15/2017   MCV 97.6 03/06/2017   PLT 268 03/06/2017      Component Value Date/Time   NA 134 (L) 04/15/2017 0741   NA 138 09/20/2013 0949   K 4.3 04/15/2017 0741   K 4.4 09/20/2013 0949   CL 99 (L) 04/15/2017 0741   CL 97 (L) 09/10/2012 1257   CO2 26 03/06/2017 1320   CO2 29 09/20/2013 0949   GLUCOSE 92 04/15/2017 0741   GLUCOSE 75 09/20/2013 0949   GLUCOSE 88 09/10/2012 1257   BUN 19 04/15/2017 0741   BUN 22.2 09/20/2013 0949   CREATININE 0.60 04/15/2017 0741   CREATININE 1.0 09/20/2013 0949   CALCIUM 9.2 03/06/2017 1320   CALCIUM 9.7 09/20/2013 0949   PROT 7.5 02/17/2017 1424   PROT 8.5 (H) 09/20/2013 0949   ALBUMIN 3.6 02/17/2017 1424   ALBUMIN 3.6 09/20/2013 0949   AST 35 02/17/2017 1424   AST 35 (H) 09/20/2013 0949   ALT 23 02/17/2017 1424   ALT 17 09/20/2013 0949  ALKPHOS 30 (L) 02/17/2017 1424   ALKPHOS 39 (L) 09/20/2013 0949   BILITOT 0.5 02/17/2017 1424   BILITOT 0.31 09/20/2013 0949   GFRNONAA >60 03/06/2017 1320   GFRAA >60 03/06/2017 1320   Lab Results  Component Value Date   CHOL 152 02/01/2017   HDL 75 02/01/2017   LDLCALC 69 02/01/2017   TRIG 41 02/01/2017   CHOLHDL 2.0 02/01/2017   Lab Results  Component Value Date   HGBA1C 6.1 (H) 07/08/2012   No results found for: VITAMINB12 Lab Results  Component Value Date   TSH 4.080 01/31/2017    10/06/14 MRI brain [I reviewed images myself and agree with interpretation. -VRP]  - Old bilateral frontal cortical and subcortical infarctions, larger on the right than the left. Hemosiderin deposition associated with the old right frontal infarction. Chronic small vessel ischemic changes elsewhere throughout the brain. No acute or reversible finding.      ASSESSMENT AND PLAN  83 y.o. year old female here with gradual onset progressive short-term memory loss and cognitive difficulties  since 2014.  MMSE's have been relatively stable since 2016 (29 --> 27).  Patient has been diagnosed with mixed vascular and neurodegenerative dementia by prior neurologist.  Overall patient stable on Namenda.   Ddx: neurodegenerative dementia vs pseudo-dementia of depression / anxiety vs metabolic vs stroke-associated cognitive impairment  1. Mild memory disturbance   2. Mild dementia      PLAN:  - continue memantine 61m twice a day - check neuropsychiatry computer testing today --> [place] - brain healthy habits reviewed - continue vascular risk factor mgmt (aspirin, plavix, statin, BP control)  Return in about 6 months (around 10/18/2017).    VPenni Bombard MD [id number]/36/[id number] 132:12AM Certified in Neurology, Neurophysiology and Neuroimaging  [facility] [address] SMetaGTarkio [place] [phone]

## 2017-04-20 NOTE — Patient Instructions (Addendum)
Thank you for coming to see [name] at [facility]. I hope we have been able to provide you high quality care today.  You may receive a patient satisfaction survey over the next few weeks. We would appreciate your feedback and comments so that we may continue to improve ourselves and the health of our patients.   - continue memantine 57m twice a day  - check [place] testing  - brain healthy habits reviewed below    ~~~~~~~~~~~~~~~~~~~~~~~~~~~~~~~~~~~~~~~~~~~~~~~~~~~~~~~~~~~~~~~~~  DR. Haidan Nhan'S GUIDE TO HAPPY AND HEALTHY LIVING These are some of my general health and wellness recommendations. Some of them may apply to you better than others. Please use common sense as you try these suggestions and feel free to ask me any questions.   ACTIVITY/FITNESS Mental, social, emotional and physical stimulation are very important for brain and body health. Try learning a new activity (arts, music, language, sports, games).  Keep moving your body to the best of your abilities. You can do this at home, inside or outside, the park, community center, gym or anywhere you like. Consider a physical therapist or personal trainer to get started. Consider the app Sworkit. Fitness trackers such as smart-watches, smart-phones or Fitbits can help as well.   NUTRITION Eat more plants: colorful vegetables, nuts, seeds and berries.  Eat less sugar, salt, preservatives and processed foods.  Avoid toxins such as cigarettes and alcohol.  Drink water when you are thirsty. Warm water with a slice of lemon is an excellent morning drink to start the day.  Consider these websites for more information The Nutrition Source (hhttps://www.henry-hernandez.biz/ [employer] Nutrition (wWindowBlog.ch   RELAXATION Consider practicing mindfulness meditation or other relaxation techniques such as deep breathing, prayer, yoga, tai chi, massage. See website  mindful.org or the apps Headspace or Calm to help get started.   SLEEP Try to get at least 7-8+ hours sleep per day. Regular exercise and reduced caffeine will help you sleep better. Practice good sleep hygeine techniques. See website sleep.org for more information.   PLANNING Prepare estate planning, living will, healthcare POA documents. Sometimes this is best planned with the help of an attorney. Theconversationproject.org and agingwithdignity.org are excellent resources.

## 2017-04-20 NOTE — Addendum Note (Signed)
Addended by: Andrey Spearman R on: 04/20/2017 05:29 PM   Modules accepted: Orders

## 2017-04-27 DIAGNOSIS — M6281 Muscle weakness (generalized): Secondary | ICD-10-CM | POA: Insufficient documentation

## 2017-04-27 DIAGNOSIS — M48061 Spinal stenosis, lumbar region without neurogenic claudication: Secondary | ICD-10-CM | POA: Insufficient documentation

## 2017-04-27 DIAGNOSIS — F410 Panic disorder [episodic paroxysmal anxiety] without agoraphobia: Secondary | ICD-10-CM | POA: Insufficient documentation

## 2017-04-27 DIAGNOSIS — B353 Tinea pedis: Secondary | ICD-10-CM | POA: Insufficient documentation

## 2017-04-27 DIAGNOSIS — J309 Allergic rhinitis, unspecified: Secondary | ICD-10-CM | POA: Insufficient documentation

## 2017-04-27 DIAGNOSIS — I2510 Atherosclerotic heart disease of native coronary artery without angina pectoris: Secondary | ICD-10-CM | POA: Insufficient documentation

## 2017-04-27 DIAGNOSIS — R8789 Other abnormal findings in specimens from female genital organs: Secondary | ICD-10-CM | POA: Diagnosis not present

## 2017-04-27 DIAGNOSIS — N952 Postmenopausal atrophic vaginitis: Secondary | ICD-10-CM | POA: Diagnosis not present

## 2017-04-27 DIAGNOSIS — R2681 Unsteadiness on feet: Secondary | ICD-10-CM | POA: Insufficient documentation

## 2017-04-27 DIAGNOSIS — R32 Unspecified urinary incontinence: Secondary | ICD-10-CM | POA: Insufficient documentation

## 2017-04-27 DIAGNOSIS — N3946 Mixed incontinence: Secondary | ICD-10-CM | POA: Insufficient documentation

## 2017-04-27 DIAGNOSIS — D649 Anemia, unspecified: Secondary | ICD-10-CM | POA: Insufficient documentation

## 2017-04-27 DIAGNOSIS — N83202 Unspecified ovarian cyst, left side: Secondary | ICD-10-CM

## 2017-04-27 DIAGNOSIS — R400 Somnolence: Secondary | ICD-10-CM | POA: Insufficient documentation

## 2017-04-27 DIAGNOSIS — J302 Other seasonal allergic rhinitis: Secondary | ICD-10-CM | POA: Insufficient documentation

## 2017-04-27 DIAGNOSIS — E7800 Pure hypercholesterolemia, unspecified: Secondary | ICD-10-CM | POA: Insufficient documentation

## 2017-04-27 DIAGNOSIS — Z7982 Long term (current) use of aspirin: Secondary | ICD-10-CM | POA: Insufficient documentation

## 2017-04-27 DIAGNOSIS — F329 Major depressive disorder, single episode, unspecified: Secondary | ICD-10-CM | POA: Insufficient documentation

## 2017-04-27 DIAGNOSIS — K589 Irritable bowel syndrome without diarrhea: Secondary | ICD-10-CM | POA: Insufficient documentation

## 2017-04-27 DIAGNOSIS — N83201 Unspecified ovarian cyst, right side: Secondary | ICD-10-CM | POA: Insufficient documentation

## 2017-04-27 DIAGNOSIS — N950 Postmenopausal bleeding: Secondary | ICD-10-CM | POA: Diagnosis not present

## 2017-04-27 DIAGNOSIS — Z951 Presence of aortocoronary bypass graft: Secondary | ICD-10-CM | POA: Insufficient documentation

## 2017-04-27 DIAGNOSIS — I7300 Raynaud's syndrome without gangrene: Secondary | ICD-10-CM | POA: Insufficient documentation

## 2017-04-27 DIAGNOSIS — C9000 Multiple myeloma not having achieved remission: Secondary | ICD-10-CM | POA: Diagnosis not present

## 2017-04-27 DIAGNOSIS — F0390 Unspecified dementia without behavioral disturbance: Secondary | ICD-10-CM | POA: Insufficient documentation

## 2017-04-27 DIAGNOSIS — I214 Non-ST elevation (NSTEMI) myocardial infarction: Secondary | ICD-10-CM | POA: Diagnosis not present

## 2017-04-27 DIAGNOSIS — R634 Abnormal weight loss: Secondary | ICD-10-CM | POA: Insufficient documentation

## 2017-04-27 DIAGNOSIS — I219 Acute myocardial infarction, unspecified: Secondary | ICD-10-CM | POA: Insufficient documentation

## 2017-04-27 DIAGNOSIS — R079 Chest pain, unspecified: Secondary | ICD-10-CM | POA: Insufficient documentation

## 2017-04-27 DIAGNOSIS — M8580 Other specified disorders of bone density and structure, unspecified site: Secondary | ICD-10-CM | POA: Insufficient documentation

## 2017-04-27 DIAGNOSIS — F419 Anxiety disorder, unspecified: Secondary | ICD-10-CM | POA: Insufficient documentation

## 2017-04-27 DIAGNOSIS — G2581 Restless legs syndrome: Secondary | ICD-10-CM | POA: Insufficient documentation

## 2017-04-27 DIAGNOSIS — M5410 Radiculopathy, site unspecified: Secondary | ICD-10-CM | POA: Insufficient documentation

## 2017-04-27 DIAGNOSIS — M1990 Unspecified osteoarthritis, unspecified site: Secondary | ICD-10-CM | POA: Insufficient documentation

## 2017-04-27 DIAGNOSIS — K219 Gastro-esophageal reflux disease without esophagitis: Secondary | ICD-10-CM | POA: Insufficient documentation

## 2017-04-27 DIAGNOSIS — I200 Unstable angina: Secondary | ICD-10-CM | POA: Insufficient documentation

## 2017-05-01 ENCOUNTER — Telehealth: Payer: Self-pay | Admitting: *Deleted

## 2017-05-01 NOTE — Telephone Encounter (Signed)
Call from pt reporting "extreme fatigue." This is new for her and she wanted to make Dr. Benay Spice aware. Pt had labs drawn at [facility] last week. Wanted MD to "have the whole picture" when reviewing labs. Pt denies dyspnea, does not feel like she needs to be seen urgently.  Message to MD.

## 2017-05-01 NOTE — Telephone Encounter (Signed)
Please get copy of lab results for our review

## 2017-05-04 DIAGNOSIS — K559 Vascular disorder of intestine, unspecified: Secondary | ICD-10-CM | POA: Diagnosis not present

## 2017-05-07 ENCOUNTER — Encounter: Payer: Self-pay | Admitting: Cardiology

## 2017-05-07 NOTE — Progress Notes (Signed)
Cardiology Office Note   Date:  05/08/2017   ID:  Sidra, [place] 1931/12/23, MRN [id number]  PCP:  [place]  Cardiologist:   Minus Breeding, MD    No chief complaint on file.     History of Present Illness: [name] is a 83 y.o. female who presents of CAD.  She was most recently admitted to the hospital in June of last year with chest pain but this was not felt to be anginal.   A stress test in [facility] two years ago had no ischemia and an EF of 75%.  Since I last saw her in the office she was in the hospital with NSTEMI and needed a PCI of a 90% circ stenosis.  She had evaluation of abdominal pain with an angiogram by Dr. Donzetta Matters.  She was not found to have any SM or celiac artery stenosis.   She has seen neurology for increased memory problems.  I reviewed neurology, vascular surgery and hospital notes for this visit.    Her biggest complaints continue to be GI.  She has abdominal pain and is now trying to eat small meals to help with this.  She still having labile blood pressures.  Occasionally is elevated in the morning and she will take an extra hydralazine.  It is mostly controlled in the afternoon.  She did have some dizziness and noted her blood pressure to be in the 80s not long ago.  I reviewed her blood pressure diary.    Past Medical History:  Diagnosis Date  . Chondrocostal junction syndrome   . Chronic fatigue   . Dementia    without Behavioral Disturbance  . GERD (gastroesophageal reflux disease)   . Hx of CABG   . Hypercholesteremia   . Hyperlipidemia    GOAL LDL <70  . Hypertension   . IBS (irritable bowel syndrome)   . Incontinent of urine   . Major depressive disorder   . Mild chronic anemia   . Mixed incontinence   . Multiple myeloma (HCC)    not having achieved remission  . NSTEMI (non-ST elevated myocardial infarction) ([place]) 01/31/2017  . Osteopenia   . Ovarian cyst, bilateral    Rt complex  . Overactive bladder    . Panic disorder   . Radiculopathy   . Raynaud's syndrome   . Raynauds syndrome   . Restless legs syndrome   . Spinal stenosis of lumbar region   . Unsteadiness on feet     Past Surgical History:  Procedure Laterality Date  . BUNIONECTOMY WITH HAMMERTOE RECONSTRUCTION Right 08/04/2012   Procedure:  HAMMERTOE RECONSTRUCTION;  Surgeon: Colin Rhein, MD;  Location: [place];  Service: Orthopedics;  Laterality: Right;  HAMMER TOE RECONSTRUCTION PROBABLE FLEXOR DIGITORUM LONGUS TO PROXIMAL PHALANX TRANSFER LATERALIZED   . CAPSULOTOMY Right 08/04/2012   Procedure: CAPSULOTOMY;  Surgeon: Colin Rhein, MD;  Location: [place];  Service: Orthopedics;  Laterality: Right;  RIGHT 2ND TOE METATARSOPHALANGEAL JOINT DORSAL CAPSULOTOMY   . CATARACT EXTRACTION, BILATERAL    . COLONOSCOPY    . CORONARY ANGIOPLASTY WITH STENT PLACEMENT  2014   2 stents  . CORONARY ARTERY BYPASS GRAFT  2007   LIMA to LAD, SVG to RCA  non-ST segment MI 08/2009 (Dr. Tamala Julian)  . CORONARY STENT INTERVENTION N/A 02/02/2017   Procedure: CORONARY STENT INTERVENTION;  Surgeon: Belva Crome, MD;  Location: [place] [facility];  Service: Cardiovascular;  Laterality: N/A;  Prox CFX  . DILATION AND CURETTAGE OF UTERUS    . EYE SURGERY     both cataracts  . LEFT HEART CATH AND CORS/GRAFTS ANGIOGRAPHY N/A 02/02/2017   Procedure: LEFT HEART CATH AND CORS/GRAFTS ANGIOGRAPHY;  Surgeon: Belva Crome, MD;  Location: [place] [facility];  Service: Cardiovascular;  Laterality: N/A;  . LEFT HEART CATHETERIZATION WITH CORONARY/GRAFT ANGIOGRAM N/A 07/09/2012   Procedure: LEFT HEART CATHETERIZATION WITH Beatrix Fetters;  Surgeon: Sinclair Grooms, MD;  Location: [facility] CATH LAB;  Service: Cardiovascular;  Laterality: N/A;  . TONSILLECTOMY AND ADENOIDECTOMY    . UPPER GASTROINTESTINAL ENDOSCOPY    . VISCERAL ANGIOGRAPHY N/A 04/15/2017   Procedure: VISCERAL ANGIOGRAPHY;  Surgeon: Waynetta Sandy,  MD;  Location: [place] [facility];  Service: Cardiovascular;  Laterality: N/A;     Current Outpatient Medications  Medication Sig Dispense Refill  . acetaminophen (TYLENOL) 650 MG CR tablet Take 1,300 mg by mouth 2 (two) times daily.    Marland Kitchen aspirin EC 81 MG tablet Take 1 tablet (81 mg total) by mouth daily. 90 tablet 3  . atorvastatin (LIPITOR) 80 MG tablet Take 1 tablet (80 mg total) by mouth daily at 6 PM. 30 tablet 5  . Calcium Carb-Cholecalciferol (CALCIUM 500 + D3) 500-600 MG-UNIT TABS Take 1 tablet by mouth daily.    . carvedilol (COREG) 3.125 MG tablet Take 1 tablet (3.125 mg total) by mouth 2 (two) times daily with a meal. 60 tablet 3  . clopidogrel (PLAVIX) 75 MG tablet Take last dose of Brilinta 04/01/17 in evening. Take 4 tablets of Clopidogrel on 04/02/17 then take 1 tablet by mouth daily thereafter. (Patient taking differently: Take 75 mg by mouth daily. ) 94 tablet 1  . docusate sodium (COLACE) 100 MG capsule Take 1 capsule (100 mg total) by mouth daily as needed. (Patient taking differently: Take 100 mg by mouth daily. May take an additional 100 mg as needed for constipation) 30 capsule 2  . hydrALAZINE (APRESOLINE) 10 MG tablet Take 10 mg by mouth daily as needed (SBP >180).    . iron polysaccharides (NU-IRON) 150 MG capsule Take 150 mg by mouth 2 (two) times daily.     . isosorbide mononitrate (IMDUR) 60 MG 24 hr tablet Take 60 mg by mouth daily.    Marland Kitchen levocetirizine (XYZAL) 5 MG tablet Take 1 tablet (5 mg total) by mouth every evening. (Patient taking differently: Take 5 mg by mouth at bedtime. ) 30 tablet 5  . LORazepam (ATIVAN) 0.5 MG tablet Take 0.25-0.5 mg by mouth 2 (two) times daily. Take 0.25 mg in the morning and 0.'5mg'$  at bedtime.  May take an additional 0.5 mg twice daily as needed for anxiety    . memantine (NAMENDA) 10 MG tablet Take 10 mg by mouth 2 (two) times daily.     . mirabegron ER (MYRBETRIQ) 25 MG TB24 tablet Take 25 mg by mouth every morning.     . montelukast  (SINGULAIR) 10 MG tablet Take 10 mg by mouth at bedtime.     . Multiple Vitamins-Minerals (CENTRUM SILVER 50+WOMEN) TABS Take 1 tablet by mouth daily.    . nitroGLYCERIN (NITROSTAT) 0.4 MG SL tablet Place 1 tablet (0.4 mg total) under the tongue every 5 (five) minutes as needed for chest pain. Reported on 11/12/2015 25 tablet 2  . omeprazole (PRILOSEC) 20 MG capsule Take 20 mg by mouth 2 (two) times daily before a meal.    . polyethylene glycol (  MIRALAX / GLYCOLAX) packet Take 17 g by mouth daily as needed for mild constipation.    . sertraline (ZOLOFT) 25 MG tablet Take daily by mouth. Takes '25mg'$  po AM and '50mg'$  po PM.     No current facility-administered medications for this visit.      ROS:  Please see the history of present illness.   Otherwise, review of systems are positive for none.   All other systems are reviewed and negative.    PHYSICAL EXAM: VS:  BP (!) 105/57   Pulse 68   Ht '5\' 3"'$  (1.6 m)   Wt 97 lb (44 kg)   SpO2 96%   BMI 17.18 kg/m  , BMI Body mass index is 17.18 kg/m. GENERAL:  Frail and thin appearing NECK:  No jugular venous distention, waveform within normal limits, carotid upstroke brisk and symmetric, no bruits, no thyromegaly LUNGS:  Clear to auscultation bilaterally BACK:  No CVA tenderness CHEST:  Unremarkable HEART:  PMI not displaced or sustained,S1 and S2 within normal limits, no S3, no S4, no clicks, no rubs, no murmurs ABD:  Flat, positive bowel sounds normal in frequency in pitch, no bruits, no rebound, no guarding, no midline pulsatile mass, no hepatomegaly, no splenomegaly EXT:  2 plus pulses throughout, no edema, no cyanosis no clubbing    EKG:  EKG is not ordered today.    Recent Labs: 07/18/2016: Magnesium 1.9 01/31/2017: TSH 4.080 02/17/2017: ALT 23; B Natriuretic Peptide 62.4 03/06/2017: Platelets 268 04/15/2017: BUN 19; Creatinine, Ser 0.60; Hemoglobin 10.2; Potassium 4.3; Sodium 134    Lipid Panel    Component Value Date/Time   CHOL 152  02/01/2017 0919   TRIG 41 02/01/2017 0919   HDL 75 02/01/2017 0919   CHOLHDL 2.0 02/01/2017 0919   VLDL 8 02/01/2017 0919   LDLCALC 69 02/01/2017 0919      Wt Readings from Last 3 Encounters:  05/08/17 97 lb (44 kg)  04/20/17 100 lb 9.6 oz (45.6 kg)  04/15/17 100 lb (45.4 kg)      Other studies Reviewed: Additional studies/ records that were reviewed today include: Extensive hospital records. Review of the above records demonstrates:  Please see elsewhere in the note.     ASSESSMENT AND PLAN:   Atherosclerosis of coronary artery bypass graft of native heart without angina pectoris No change in therapy is indicated.  She will continue with meds as listed.  She will remain on DAPT   Essential hypertension I reviewed an extensive blood pressure diary.  Her blood pressure fluctuates somewhat but for the most part is well controlled.  She has had some symptomatic hypotension would be leery of titrating her meds further for tighter control.  There is no trend in her blood pressures so I will not make any adjustments.  She will continue the meds as listed.   Hyperlipidemia I think that she is tolerating the Lipitor.  I will schedule a lipid profile.      Current medicines are reviewed at length with the patient today.  The patient does not have concerns regarding medicines.  The following changes have been made:  no change  Labs/ tests ordered today include:  No orders of the defined types were placed in this encounter.   (Greater than 40 minutes reviewing all data with greater than 50% face to face with the patient).   Disposition:   FU with me 6 months.      Signed, Minus Breeding, MD  05/08/2017 4:59 PM  [facility]

## 2017-05-08 ENCOUNTER — Encounter: Payer: Self-pay | Admitting: Vascular Surgery

## 2017-05-08 ENCOUNTER — Ambulatory Visit: Payer: PPO | Admitting: Cardiology

## 2017-05-08 ENCOUNTER — Encounter: Payer: Self-pay | Admitting: Cardiology

## 2017-05-08 ENCOUNTER — Telehealth: Payer: Self-pay | Admitting: *Deleted

## 2017-05-08 VITALS — BP 105/57 | HR 68 | Ht 63.0 in | Wt 97.0 lb

## 2017-05-08 DIAGNOSIS — I2510 Atherosclerotic heart disease of native coronary artery without angina pectoris: Secondary | ICD-10-CM

## 2017-05-08 DIAGNOSIS — E785 Hyperlipidemia, unspecified: Secondary | ICD-10-CM

## 2017-05-08 DIAGNOSIS — I10 Essential (primary) hypertension: Secondary | ICD-10-CM

## 2017-05-08 NOTE — Patient Instructions (Signed)

## 2017-05-08 NOTE — Telephone Encounter (Signed)
[place] to request lab results for this month. Nurse reports last lab was drawn in September. Spoke with pt's daughter, she thinks pt is confused. Daughter thinks her energy level has been the same.  [employer] requested new order for lab. Per last office note, pt needs CBC only. Order faxed.

## 2017-05-08 NOTE — Telephone Encounter (Signed)
Call from nurse at [facility], pt had CBC drawn on 11/19. Copy faxed to office, placed on MD desk.

## 2017-05-11 ENCOUNTER — Telehealth: Payer: Self-pay | Admitting: Cardiology

## 2017-05-11 NOTE — Telephone Encounter (Signed)
New Message  Pt daughter call requesting to speak with RN to see if 30 page fax was received from [employer].. Please call back to discuss

## 2017-05-12 DIAGNOSIS — H52203 Unspecified astigmatism, bilateral: Secondary | ICD-10-CM | POA: Diagnosis not present

## 2017-05-12 DIAGNOSIS — C9001 Multiple myeloma in remission: Secondary | ICD-10-CM | POA: Diagnosis not present

## 2017-05-12 DIAGNOSIS — Z961 Presence of intraocular lens: Secondary | ICD-10-CM | POA: Diagnosis not present

## 2017-05-12 NOTE — Telephone Encounter (Signed)
As stated in my office note.  "  I reviewed an extensive blood pressure diary.  Her blood pressure fluctuates somewhat but for the most part is well controlled.  She has had some symptomatic hypotension and I would be leery of titrating her meds further for tighter control.  There is no trend in her blood pressures so I will not make any adjustments.  She will continue the meds as listed. "

## 2017-05-12 NOTE — Telephone Encounter (Signed)
Call and spoke with nurse at [facility] to have pt BP reading faxed over for Dr Percival Spanish to review

## 2017-05-12 NOTE — Telephone Encounter (Signed)
Returned call to [name] [facility]). Patient saw Dr. Percival Spanish on 11/30 and the list of BP readings was not sent with her to her appointment from [facility] assisted living. This was fax, and Hebert Soho confirmed that she has this fax. Daughter [name] reports that patient's BP has been low, complains of dizziness. Daughter is wondering if any of her medications need to be changed.   Daughter [name] requests a return call with MD advice

## 2017-05-12 NOTE — Telephone Encounter (Signed)
Please call Susan,Very concerned pt's blood pressure was real low this morning.The top number was 88,she did not know the bottom number.

## 2017-05-13 ENCOUNTER — Other Ambulatory Visit: Payer: Self-pay | Admitting: Allergy

## 2017-05-13 MED ORDER — LEVOCETIRIZINE DIHYDROCHLORIDE 5 MG PO TABS: 5.0000 mg | ORAL_TABLET | Freq: Every evening | ORAL | 5 refills | Status: DC

## 2017-05-20 ENCOUNTER — Telehealth: Payer: Self-pay | Admitting: Cardiology

## 2017-05-20 NOTE — Telephone Encounter (Signed)
Returned call to NP Quilty @ [facility] she states that she was told by pt what she has been c/o syncope, light-headedness and nausea with her Lipitor. She states that she does not want to stop without you saying that its ok to do so. Do you want to stop and rx something else? Please advise.

## 2017-05-20 NOTE — Telephone Encounter (Signed)
Pt c/o medication issue:  1. Name of Medication: Lipitor   2. How are you currently taking this medication (dosage and times per day)? 80 mg/ 1x day  3. Are you having a reaction (difficulty breathing--STAT)? Light-headedness and nausea every morning   4. What is your medication issue?Patient is having some light-headedness and nausea with medication.   Ofilia Neas NP, calling from [facility]. Asks that we call the nursing station.

## 2017-05-21 DIAGNOSIS — E785 Hyperlipidemia, unspecified: Secondary | ICD-10-CM | POA: Diagnosis not present

## 2017-05-21 DIAGNOSIS — R42 Dizziness and giddiness: Secondary | ICD-10-CM | POA: Diagnosis not present

## 2017-05-21 NOTE — Telephone Encounter (Signed)
Stop the Lipitor.  Do not start anything in place of this as she is sensitive to meds.

## 2017-05-22 NOTE — Telephone Encounter (Signed)
Notified [facility] she will inform NP Quilty

## 2017-06-11 DIAGNOSIS — N950 Postmenopausal bleeding: Secondary | ICD-10-CM | POA: Diagnosis not present

## 2017-06-30 ENCOUNTER — Ambulatory Visit (HOSPITAL_COMMUNITY): Payer: PPO

## 2017-07-01 ENCOUNTER — Telehealth: Payer: Self-pay | Admitting: *Deleted

## 2017-07-01 NOTE — Telephone Encounter (Signed)
Message from pt reporting she requested to cancel 1/24 appts due to not feeling well. Pt reports BP was elevated and she was short of breath. Unable to reach pt, spoke with Malachy Mood, nurse at [facility], she reports they have canceled transportation for tomorrow.  Appts given for 1/29. Pt is also due for bone survey, called and rescheduled for 1/29 @ 1PM. Malachy Mood will notify pt and transportation.

## 2017-07-02 ENCOUNTER — Ambulatory Visit: Payer: Self-pay | Admitting: Nurse Practitioner

## 2017-07-02 ENCOUNTER — Other Ambulatory Visit: Payer: Self-pay

## 2017-07-07 ENCOUNTER — Inpatient Hospital Stay: Payer: PPO | Attending: Oncology

## 2017-07-07 ENCOUNTER — Ambulatory Visit (HOSPITAL_COMMUNITY)
Admission: RE | Admit: 2017-07-07 | Discharge: 2017-07-07 | Disposition: A | Discharge status: Home / Self Care | Payer: PPO | Source: Ambulatory Visit | Attending: Oncology | Admitting: Oncology

## 2017-07-07 ENCOUNTER — Inpatient Hospital Stay (HOSPITAL_BASED_OUTPATIENT_CLINIC_OR_DEPARTMENT_OTHER): Payer: PPO | Admitting: Oncology

## 2017-07-07 VITALS — BP 188/65 | HR 64 | Temp 97.3°F | Resp 17 | Ht 63.0 in | Wt 98.1 lb

## 2017-07-07 DIAGNOSIS — D709 Neutropenia, unspecified: Secondary | ICD-10-CM

## 2017-07-07 DIAGNOSIS — C9000 Multiple myeloma not having achieved remission: Secondary | ICD-10-CM | POA: Diagnosis not present

## 2017-07-07 DIAGNOSIS — R748 Abnormal levels of other serum enzymes: Secondary | ICD-10-CM

## 2017-07-07 DIAGNOSIS — D630 Anemia in neoplastic disease: Secondary | ICD-10-CM | POA: Diagnosis not present

## 2017-07-07 DIAGNOSIS — C9001 Multiple myeloma in remission: Secondary | ICD-10-CM

## 2017-07-07 DIAGNOSIS — M8580 Other specified disorders of bone density and structure, unspecified site: Secondary | ICD-10-CM | POA: Insufficient documentation

## 2017-07-07 LAB — BASIC METABOLIC PANEL
Anion gap: 6 (ref 3–11)
BUN: 19 mg/dL (ref 7–26)
CALCIUM: 9.3 mg/dL (ref 8.4–10.4)
CO2: 28 mmol/L (ref 22–29)
CREATININE: 0.87 mg/dL (ref 0.60–1.10)
Chloride: 98 mmol/L (ref 98–109)
GFR calc Af Amer: >60 mL/min (ref >60)
GFR calc non Af Amer: 60 mL/min — ABNORMAL LOW (ref >60)
Glucose, Bld: 88 mg/dL (ref 70–140)
Potassium: 4.1 mmol/L (ref 3.3–4.7)
SODIUM: 132 mmol/L — AB (ref 136–145)

## 2017-07-07 LAB — HEPATIC FUNCTION PANEL
ALBUMIN: 3.7 g/dL (ref 3.5–5.0)
ALT: 16 U/L (ref 14–54)
AST: 31 U/L (ref 15–41)
Alkaline Phosphatase: 89 U/L (ref 38–126)
Bilirubin, Direct: <0.1 mg/dL — ABNORMAL LOW (ref 0.1–0.5)
TOTAL PROTEIN: 8 g/dL (ref 6.5–8.1)
Total Bilirubin: 0.3 mg/dL (ref 0.3–1.2)

## 2017-07-07 LAB — CBC WITH DIFFERENTIAL/PLATELET
BASOS PCT: 0 %
Basophils Absolute: 0 10*3/uL (ref 0.0–0.1)
EOS ABS: 0.1 10*3/uL (ref 0.0–0.5)
Eosinophils Relative: 3 %
HCT: 28.6 % — ABNORMAL LOW (ref 34.8–46.6)
HEMOGLOBIN: 9.6 g/dL — AB (ref 11.6–15.9)
Lymphocytes Relative: 47 %
Lymphs Abs: 1.2 10*3/uL (ref 0.9–3.3)
MCH: 33.2 pg (ref 25.1–34.0)
MCHC: 33.6 g/dL (ref 31.5–36.0)
MCV: 99 fL (ref 79.5–101.0)
MONOS PCT: 14 %
Monocytes Absolute: 0.4 10*3/uL (ref 0.1–0.9)
Neutro Abs: 0.9 10*3/uL — ABNORMAL LOW (ref 1.5–6.5)
Neutrophils Relative %: 36 %
Platelets: 235 10*3/uL (ref 145–400)
RBC: 2.89 MIL/uL — ABNORMAL LOW (ref 3.70–5.45)
RDW: 15.1 % (ref 11.2–16.1)
WBC: 2.5 10*3/uL — ABNORMAL LOW (ref 3.9–10.3)

## 2017-07-07 NOTE — Progress Notes (Signed)
[facility] OFFICE PROGRESS NOTE   Diagnosis: Indolent multiple myeloma  INTERVAL HISTORY:   [name] returns for a scheduled visit.  She has multiple complaints including malaise, postprandial "dizziness ", and postprandial upper abdominal pain.  She was diagnosed with "mesenteric ischemia "by Dr. Earlean [name].  She has also seen Dr. Oletta [name] for evaluation of abdominal pain. She had a myocardial infarction in August and underwent placement of a drug-eluting stent of a 90% circumflex stenosis.  No fever.  Occasional night sweats.  She eats 4 small meals per day.  No bone pain.   Objective:  Vital signs in last 24 hours:  Blood pressure (!) 188/65, pulse 64, temperature (!) 97.3 F (36.3 C), temperature source Oral, resp. rate 17, height _0  (1.6 m), weight 98 lb 1.6 oz (44.5 kg), SpO2 97 %.    HEENT: Neck without mass,?  Mild scleral and subungual icterus Lymphatics: "Shotty "bilateral scalene, axillary, and inguinal nodes Resp: Distant breath sounds, no respiratory distress Cardio: Regular rate and rhythm GI: No hepatosplenomegaly, no mass, nontender Vascular: Trace lower leg edema bilaterally   Lab Results:  Lab Results  Component Value Date   WBC 2.5 (L) 07/07/2017   HGB 9.6 (L) 07/07/2017   HCT 28.6 (L) 07/07/2017   MCV 99.0 07/07/2017   PLT 235 07/07/2017   NEUTROABS 0.9 (L) 07/07/2017    CMP     Component Value Date/Time   NA 132 (L) 07/07/2017 1500   NA 138 09/20/2013 0949   K 4.1 07/07/2017 1500   K 4.4 09/20/2013 0949   CL 98 07/07/2017 1500   CL 97 (L) 09/10/2012 1257   CO2 28 07/07/2017 1500   CO2 29 09/20/2013 0949   GLUCOSE 88 07/07/2017 1500   GLUCOSE 75 09/20/2013 0949   GLUCOSE 88 09/10/2012 1257   BUN 19 07/07/2017 1500   BUN 22.2 09/20/2013 0949   CREATININE 0.87 07/07/2017 1500   CREATININE 1.0 09/20/2013 0949   CALCIUM 9.3 07/07/2017 1500   CALCIUM 9.7 09/20/2013 0949   PROT 7.5 02/17/2017 1424   PROT 8.5 (H) 09/20/2013  0949   ALBUMIN 3.6 02/17/2017 1424   ALBUMIN 3.6 09/20/2013 0949   AST 35 02/17/2017 1424   AST 35 (H) 09/20/2013 0949   ALT 23 02/17/2017 1424   ALT 17 09/20/2013 0949   ALKPHOS 30 (L) 02/17/2017 1424   ALKPHOS 39 (L) 09/20/2013 0949   BILITOT 0.5 02/17/2017 1424   BILITOT 0.31 09/20/2013 0949   GFRNONAA 60 (L) 07/07/2017 1500   GFRAA >60 07/07/2017 1500   Labs from [facility] 05/21/2017: Hemoglobin 9.5, WBC 4.7, ANC 2.1, platelets 345,000 Alkaline phosphatase 1123, AST 368, ALT 222, bilirubin 0.8, TSH 2.8 Medications: I have reviewed the patient's current medications.   Assessment/Plan: 1. Indolent multiple myeloma, asymptomatic. No clinical evidence of disease progression. A metastatic bone survey in February 2016 was negative for lytic lesions.  2. Mild anemia secondary to multiple myeloma-progressive 3. Neutropenia-likely secondary to myeloma 4. History of coronary artery disease. 5. Osteopenia. 6. History of multiple urinary tract infections, followed by Dr. Diona [name]. 7. "Raynaud" syndrome. 8. Chronic back pain. 9. History of hyponatremia. 10. Right foot surgery February 2014 11. Bilateral ovarian cysts-followed by GYN oncology 12. Elevated liver enzymes December 2018   Disposition: [name] has a complex medical history including a chronic low level serum M spike with chronic anemia/leukopenia.  We will follow-up on the serum protein electrophoresis and light chain levels from today.  She was  noted to have marked elevation of the liver enzymes on a chemistry panel last month.  This could be related to polypharmacy, hepatitis, or another GI process.  I have a low clinical suspicion for myeloma involving the liver. I recommended she hold Tylenol and Lipitor until we receive the result of a repeat chemistry panel today.  She had an unremarkable abdominal ultrasound 03/09/2017.  She will return for an office visit in 1 month.  We will make a GI referral if the  liver enzymes return elevated today.  25 minutes were spent with the patient today.  The majority of the time was used for counseling and coordination of care.  Betsy Coder, MD  07/07/2017  5:09 PM

## 2017-07-08 ENCOUNTER — Telehealth: Payer: Self-pay

## 2017-07-08 LAB — IGG: IGG (IMMUNOGLOBIN G), SERUM: 2475 mg/dL — AB (ref 700–1600)

## 2017-07-08 NOTE — Telephone Encounter (Addendum)
Pt voiced understanding of message below.   ----- Message from Ladell Pier, MD sent at 07/07/2017  6:36 PM EST ----- Please call patient, the liver tests are normal today, follow-up as scheduled

## 2017-07-08 NOTE — Telephone Encounter (Signed)
Returned call to [employer] to inform that pt is ok to resume Tylenol per Dr. Benay Spice. Voiced understanding.

## 2017-07-09 LAB — PROTEIN ELECTROPHORESIS, SERUM
A/G Ratio: 0.9 (ref 0.7–1.7)
ALPHA-1-GLOBULIN: 0.2 g/dL (ref 0.0–0.4)
ALPHA-2-GLOBULIN: 0.6 g/dL (ref 0.4–1.0)
Albumin ELP: 3.9 g/dL (ref 2.9–4.4)
Beta Globulin: 3.2 g/dL — ABNORMAL HIGH (ref 0.7–1.3)
GLOBULIN, TOTAL: 4.2 g/dL — AB (ref 2.2–3.9)
Gamma Globulin: 0.2 g/dL — ABNORMAL LOW (ref 0.4–1.8)
M-SPIKE, %: 2.5 g/dL — AB
TOTAL PROTEIN ELP: 8.1 g/dL (ref 6.0–8.5)

## 2017-07-10 ENCOUNTER — Telehealth: Payer: Self-pay

## 2017-07-10 NOTE — Telephone Encounter (Addendum)
Pt voiced understanding regarding message below.    ----- Message from Ladell Pier, MD sent at 07/08/2017  6:35 PM EST ----- Please call patient Bone survey shows no evidence of myeloma

## 2017-07-14 DIAGNOSIS — I25700 Atherosclerosis of coronary artery bypass graft(s), unspecified, with unstable angina pectoris: Secondary | ICD-10-CM | POA: Diagnosis not present

## 2017-07-14 DIAGNOSIS — I10 Essential (primary) hypertension: Secondary | ICD-10-CM | POA: Diagnosis not present

## 2017-07-14 DIAGNOSIS — I252 Old myocardial infarction: Secondary | ICD-10-CM | POA: Diagnosis not present

## 2017-07-14 DIAGNOSIS — E785 Hyperlipidemia, unspecified: Secondary | ICD-10-CM | POA: Diagnosis not present

## 2017-07-15 ENCOUNTER — Ambulatory Visit: Payer: PPO | Admitting: Adult Health

## 2017-07-15 ENCOUNTER — Encounter: Payer: Self-pay | Admitting: Adult Health

## 2017-07-15 VITALS — BP 102/55 | HR 65 | Resp 16 | Ht 63.0 in | Wt 97.0 lb

## 2017-07-15 DIAGNOSIS — I10 Essential (primary) hypertension: Secondary | ICD-10-CM

## 2017-07-15 DIAGNOSIS — K551 Chronic vascular disorders of intestine: Secondary | ICD-10-CM

## 2017-07-15 DIAGNOSIS — I2584 Coronary atherosclerosis due to calcified coronary lesion: Secondary | ICD-10-CM

## 2017-07-15 DIAGNOSIS — I2510 Atherosclerotic heart disease of native coronary artery without angina pectoris: Secondary | ICD-10-CM | POA: Diagnosis not present

## 2017-07-15 MED ORDER — HYDRALAZINE HCL 25 MG PO TABS: 25.0000 mg | ORAL_TABLET | Freq: Two times a day (BID) | ORAL | 5 refills | Status: DC | PRN

## 2017-07-15 NOTE — Progress Notes (Signed)
Cardiology Office Note   Date:  07/15/2017   ID:  [name], [name] 02-01-1932, MRN [id number]  PCP:  [facility] [facility]  Cardiologist: Dr. Percival Spanish  Chief Complaint  Patient presents with  . Follow-up    dizziness, medication management     History of Present Illness: [name] is a 84 y.o. female who presents for ongoing assessment and management of coronary artery disease, she was recently admitted to [facility] in the setting of a non-STEMI in the setting of a 90% stenosed circumflex artery which required PCI.  Patient's main complaint when last seen were in etiology with frequent abdominal pain.  Patient was also having labile blood pressures.  Other history includes irritable bowel syndrome, hypercholesterolemia, hyperlipidemia, chronic mild anemia, major depressive disorder.  Last office visit the patient was found to be clinically stable from a cardiac standpoint, no medication changes were made, no testing was planned. Unfortunately, she called our office on 05/21/2017 complaining of lightheadedness and nausea after taking her Lipitor. He asked if she can start taking it or start something else. Dr. Percival Spanish has reviewed this and is advised her to stop taking the Lipitor he did not wish to restart anything at this time. This will be discussed on office visit.   Comes today with multiple complaints. She brings with her a list of her blood pressures that were being taken at the assisted-living facility [address]" she is taken by different CNA's, using different size cuff each time. The blood pressures ranging from 192/80 2 in the morning, down to 93/52 in the afternoon. She has had take 2 doses of extra hydralazine 10 mg is being seen last. The patient is taking hydralazine 25 mg in the a.m. She often still weak in the afternoons.   She also continues to have discomfort in her abdomen, especially after eating, and limits when she eats to avoid discomfort.  She is avoiding spicy foods. She has been seen by [facility] by Dr. Richmond Campbell, and had a ultrasound of the mesenteric artery via duplex. This was completed on 03/17/2017. She was told that she had mesenteric ischemia and should be seen by a vascular specialist. She was discharged home of his diagnosis and did not proceed with any further testing.  Is medically compliant. Medications are provided for her by the assisted-living facility.   Past Medical History:  Diagnosis Date  . Chondrocostal junction syndrome   . Chronic fatigue   . Dementia    without Behavioral Disturbance  . GERD (gastroesophageal reflux disease)   . Hx of CABG   . Hypercholesteremia   . Hyperlipidemia    GOAL LDL <70  . Hypertension   . IBS (irritable bowel syndrome)   . Incontinent of urine   . Major depressive disorder   . Mild chronic anemia   . Mixed incontinence   . Multiple myeloma (HCC)    not having achieved remission  . NSTEMI (non-ST elevated myocardial infarction) ([place]) 01/31/2017  . Osteopenia   . Ovarian cyst, bilateral    Rt complex  . Overactive bladder   . Panic disorder   . Radiculopathy   . Raynaud's syndrome   . Raynauds syndrome   . Restless legs syndrome   . Spinal stenosis of lumbar region   . Unsteadiness on feet     Past Surgical History:  Procedure Laterality Date  . BUNIONECTOMY WITH HAMMERTOE RECONSTRUCTION Right 08/04/2012   Procedure:  HAMMERTOE RECONSTRUCTION;  Surgeon: Dorene Ar  Beola Cord, MD;  Location: [place];  Service: Orthopedics;  Laterality: Right;  HAMMER TOE RECONSTRUCTION PROBABLE FLEXOR DIGITORUM LONGUS TO PROXIMAL PHALANX TRANSFER LATERALIZED   . CAPSULOTOMY Right 08/04/2012   Procedure: CAPSULOTOMY;  Surgeon: Colin Rhein, MD;  Location: [place];  Service: Orthopedics;  Laterality: Right;  RIGHT 2ND TOE METATARSOPHALANGEAL JOINT DORSAL CAPSULOTOMY   . CATARACT EXTRACTION, BILATERAL    . COLONOSCOPY    . CORONARY ANGIOPLASTY WITH  STENT PLACEMENT  2014   2 stents  . CORONARY ARTERY BYPASS GRAFT  2007   LIMA to LAD, SVG to RCA  non-ST segment MI 08/2009 (Dr. Tamala Julian)  . CORONARY STENT INTERVENTION N/A 02/02/2017   Procedure: CORONARY STENT INTERVENTION;  Surgeon: Belva Crome, MD;  Location: [place] [facility];  Service: Cardiovascular;  Laterality: N/A;  Prox CFX  . DILATION AND CURETTAGE OF UTERUS    . EYE SURGERY     both cataracts  . LEFT HEART CATH AND CORS/GRAFTS ANGIOGRAPHY N/A 02/02/2017   Procedure: LEFT HEART CATH AND CORS/GRAFTS ANGIOGRAPHY;  Surgeon: Belva Crome, MD;  Location: [place] [facility];  Service: Cardiovascular;  Laterality: N/A;  . LEFT HEART CATHETERIZATION WITH CORONARY/GRAFT ANGIOGRAM N/A 07/09/2012   Procedure: LEFT HEART CATHETERIZATION WITH Beatrix Fetters;  Surgeon: Sinclair Grooms, MD;  Location: [facility];  Service: Cardiovascular;  Laterality: N/A;  . TONSILLECTOMY AND ADENOIDECTOMY    . UPPER GASTROINTESTINAL ENDOSCOPY    . VISCERAL ANGIOGRAPHY N/A 04/15/2017   Procedure: VISCERAL ANGIOGRAPHY;  Surgeon: Waynetta Sandy, MD;  Location: [place] [facility];  Service: Cardiovascular;  Laterality: N/A;     Current Outpatient Medications  Medication Sig Dispense Refill  . acetaminophen (TYLENOL) 650 MG CR tablet Take 1,300 mg by mouth 2 (two) times daily.    Marland Kitchen aspirin EC 81 MG tablet Take 1 tablet (81 mg total) by mouth daily. 90 tablet 3  . Calcium Carb-Cholecalciferol (CALCIUM 500 + D3) 500-600 MG-UNIT TABS Take 1 tablet by mouth daily.    . carvedilol (COREG) 3.125 MG tablet Take 1 tablet (3.125 mg total) by mouth 2 (two) times daily with a meal. 60 tablet 3  . clopidogrel (PLAVIX) 75 MG tablet Take last dose of Brilinta 04/01/17 in evening. Take 4 tablets of Clopidogrel on 04/02/17 then take 1 tablet by mouth daily thereafter. (Patient taking differently: Take 75 mg by mouth daily. ) 94 tablet 1  . docusate sodium (COLACE) 100 MG capsule Take 1 capsule (100 mg  total) by mouth daily as needed. (Patient taking differently: Take 100 mg by mouth daily. May take an additional 100 mg as needed for constipation) 30 capsule 2  . hydrALAZINE (APRESOLINE) 10 MG tablet Take 10 mg by mouth daily as needed (SBP >180).    . iron polysaccharides (NU-IRON) 150 MG capsule Take 150 mg by mouth 2 (two) times daily.     . isosorbide mononitrate (IMDUR) 60 MG 24 hr tablet Take 60 mg by mouth daily.    Marland Kitchen levocetirizine (XYZAL) 5 MG tablet Take 1 tablet (5 mg total) by mouth every evening. 30 tablet 5  . LORazepam (ATIVAN) 0.5 MG tablet Take 0.25-0.5 mg by mouth 2 (two) times daily. Take 0.25 mg in the morning and 0.15m at bedtime.  May take an additional 0.5 mg twice daily as needed for anxiety    . memantine (NAMENDA) 10 MG tablet Take 10 mg by mouth 2 (two) times daily.     .Marland Kitchen  mirabegron ER (MYRBETRIQ) 25 MG TB24 tablet Take 25 mg by mouth every morning.     . montelukast (SINGULAIR) 10 MG tablet Take 10 mg by mouth at bedtime.     . Multiple Vitamins-Minerals (CENTRUM SILVER 50+WOMEN) TABS Take 1 tablet by mouth daily.    . nitroGLYCERIN (NITROSTAT) 0.4 MG SL tablet Place 1 tablet (0.4 mg total) under the tongue every 5 (five) minutes as needed for chest pain. Reported on 11/12/2015 25 tablet 2  . omeprazole (PRILOSEC) 20 MG capsule Take 20 mg by mouth 2 (two) times daily before a meal.    . polyethylene glycol (MIRALAX / GLYCOLAX) packet Take 17 g by mouth daily as needed for mild constipation.    Marland Kitchen PREMARIN vaginal cream     . Probiotic Product (PROBIOTIC DAILY PO) Take 1 packet by mouth daily. VSL #3 450 billion cell    . sertraline (ZOLOFT) 25 MG tablet Take daily by mouth. Takes 2m po AM and 542mpo PM.    . hydrALAZINE (APRESOLINE) 25 MG tablet Take 25 mg by mouth.     No current facility-administered medications for this visit.     Allergies:   Butazolidin [phenylbutazone]; Cephalosporins; Codeine; Levaquin [levofloxacin]; Macrodantin; Morphine and related;  Trimethoprim; Buprenorphine; Cilostazol; Dexilant [dexlansoprazole]; Diclofenac; Erythromycin; Morphine; Nitrofurantoin; Simvastatin; Sulfa antibiotics; Vesicare [solifenacin]; Clindamycin/lincomycin; Food; Ibuprofen; Lincomycin hcl; Penicillins; Toviaz [fesoterodine fumarate er]; and Voltaren [diclofenac sodium]    Social History:  The patient  reports that  has never smoked. she has never used smokeless tobacco. She reports that she drinks alcohol. She reports that she does not use drugs.   Family History:  The patient's family history includes CAD in her mother; Cancer in her paternal aunt; Heart attack in her mother; Obesity in her sister.    ROS: All other systems are reviewed and negative. Unless otherwise mentioned in H&P    PHYSICAL EXAM: VS:  BP (!) 102/55   Pulse 65   Resp 16   Ht '5\' 3"'  (1.6 m)   Wt 97 lb (44 kg)   SpO2 99%   BMI 17.18 kg/m  , BMI Body mass index is 17.18 kg/m. GEN: Well nourished, well developed, in no acute distress  HEENT: normal  Neck: no JVD, carotid bruits, or masses Cardiac: RRR; no murmurs, rubs, or gallops,no edema  Respiratory:  clear to auscultation bilaterally, normal work of breathing GI: soft, nontender, nondistended, + BS MS: no deformity or atrophy  Skin: warm and dry, no rash Neuro:  Strength and sensation are intact Psych: euthymic mood, full affect   Recent Labs: 07/18/2016: Magnesium 1.9 01/31/2017: TSH 4.080 02/17/2017: B Natriuretic Peptide 62.4 07/07/2017: ALT 16; BUN 19; Creatinine, Ser 0.87; Hemoglobin 9.6; Platelets 235; Potassium 4.1; Sodium 132    Lipid Panel    Component Value Date/Time   CHOL 152 02/01/2017 0919   TRIG 41 02/01/2017 0919   HDL 75 02/01/2017 0919   CHOLHDL 2.0 02/01/2017 0919   VLDL 8 02/01/2017 0919   LDLCALC 69 02/01/2017 0919      Wt Readings from Last 3 Encounters:  07/15/17 97 lb (44 kg)  07/07/17 98 lb 1.6 oz (44.5 kg)  05/08/17 97 lb (44 kg)      Other studies Reviewed:  Vascular  ultrasound with duplex of the abdomen mesenteric artery: Dated 03/17/2017  Impressions Unilateral ImpressionPre and Post Prandial Imaging and Waveforms obtained of the Hepatic Artery, Splenic Artery, Aorta, Celiac trunk and Superior Mesenteric Artery. Aorta measures 1.39x1.82cms. Elevated Velocities seen  in the Celiac Trunk: Origin 294cm/s, Prox 295 cm/s and Distal 214 cm/s. Elevated Velocities seen in the Superior Mesenteric Artery Post Prandial: Origin 397 cm/s, Prox 466 cm/s and Mid 339 cm/s. The Post-Prandial Velocities suggest Ischemia. Patient Status:Routine. Study Location: Outpatient. Technical Quality:Good visualization. Velocities are measured in cm/s ; Diameters are measured in mm  The exam was performed preprandial. Mesenteric Duplex Measurements   +-------------------+-----+----+ !Location !PSV!EDV ! +-------------------+-----+----+ !Prox Aorta !99.5 !0 ! +-------------------+-----+----+ !Aorta Supra Celiac !203!36.9! +-------------------+-----+----+ !Prox Celiac Trunk!825!18.9! +-------------------+-----+----+ !Dist Celiac Trunk!174!31.2! +-------------------+-----+----+ !Prox Common Hepatic!-54.4!-11 ! +-------------------+-----+----+ !Prox Splenic !70.1 !19.3! +-------------------+-----+----+ !Ostial SMA !226!0 ! +-------------------+-----+----+ !Prox SMA !258!0 ! +-------------------+-----+----+ !Mid SMA!226!0 ! +-------------------+-----+----+ !Dist SMA !211!0 ! +-------------------+-----+----+ !Prox IMA !87.3 !0 ! +-------------------+-----+----+  CT of the Abdomen and Pelvis. 12/28/2015 CONCLUSION: No evidence of bowel obstruction.  Normal appendix.  No colonic wall thickening or inflammatory changes.  2.3 cm right ovarian cyst. Given the patient's postmenopausal status, consider follow-up pelvic ultrasound in 1 year as  clinically warranted. ASSESSMENT AND PLAN:  1.  Hypertension: Patient's blood pressure recordings are very labile, based upon what she is brought with her today. She admits that the CNA's an assisted-living facility often used different size cuff, do not always take it in the same arm. She states that sometimes when her blood pressure is elevated, these do not give her extra doses of hydralazine. For clarity, I'm going to order a 24-hour ambulatory blood pressure monitor to evaluate further with her pressures are doing throughout a typical day for her. For now she will continue hydralazine 25 mg daily, and continue the parameters of giving an additional 10 mg of hydralazine if blood pressure remained elevated greater than 984 mmHg systolic. I am not going to change any of her medications today to avoid confusion. I retook her blood pressure manually with a smaller cuff due to her very small arms. Blood pressure was 138/80.   2. Abdominal pain: Question mesenteric ischemia. She has had numerous tests at [facility]. I discussed this with Dr. Gwenlyn Found on site, concerning need to refer to him. He has reviewed the CTA, and recent mesenteric artery ultrasound. Although these tests were completed in 2017, and 2018, there was some question of this diagnosis. 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 He has recommended that she have another CTA of her abdomen/mesenteric artery, and then refer her to him for follow-up as further treatment options. The patient has been informed of this plan.  3. Coronary artery disease: Continue medical therapy.   Current medicines are reviewed at length with the patient today.    Labs/ tests ordered today include: Ambulatory blood pressure recording 24 hours. CTA of mesenteric artery. Referral to Dr. Arnoldo Hooker. [place] Pugh, ANP, AACC   07/15/2017 2:39 PM    [facility] [address] [place], [place]  [address] Phone: [phone]; Fax: [phone]

## 2017-07-15 NOTE — Patient Instructions (Signed)
Medication Instructions:  CHANGE HYDRALAZINE DAILY-ONLY TAKE THE DOSE IN THE AFTERNOON IS SBP(TOP NUMBER) IS >160 ONLY  If you need a refill on your cardiac medications before your next appointment, please call your pharmacy.  Testing/Procedures: PLEASE SCHEDULE A 24 HOUR BLOOD PRESSURE MONITOR-This will be performed at our [facility] location - [address].  Follow-Up: Your physician wants you to follow-up in: [place].  Thank you for choosing [facility] at [facility]!!

## 2017-07-16 ENCOUNTER — Other Ambulatory Visit: Payer: Self-pay

## 2017-07-16 DIAGNOSIS — I2584 Coronary atherosclerosis due to calcified coronary lesion: Secondary | ICD-10-CM

## 2017-07-16 DIAGNOSIS — I10 Essential (primary) hypertension: Secondary | ICD-10-CM

## 2017-07-16 DIAGNOSIS — I214 Non-ST elevation (NSTEMI) myocardial infarction: Secondary | ICD-10-CM

## 2017-07-16 DIAGNOSIS — I25708 Atherosclerosis of coronary artery bypass graft(s), unspecified, with other forms of angina pectoris: Secondary | ICD-10-CM

## 2017-07-16 DIAGNOSIS — I2510 Atherosclerotic heart disease of native coronary artery without angina pectoris: Secondary | ICD-10-CM

## 2017-07-16 NOTE — Progress Notes (Signed)
Per KL &JB

## 2017-07-17 ENCOUNTER — Telehealth: Payer: Self-pay | Admitting: Adult Health

## 2017-07-17 DIAGNOSIS — Z1231 Encounter for screening mammogram for malignant neoplasm of breast: Secondary | ICD-10-CM | POA: Diagnosis not present

## 2017-07-17 NOTE — Telephone Encounter (Signed)
10 mg prn

## 2017-07-17 NOTE — Telephone Encounter (Signed)
Spoke with [facility]  Patient has been on hydralazine 25mg  BID since 02/11/2017 -- was on 10mg  hydralazine PRN for systolic BP >979  Fax: [phone] (to provide med clarification/orders)  Will forward to MD/DNP for clarification on hydralazine 25mg  QD or BID

## 2017-07-17 NOTE — Telephone Encounter (Signed)
Clarified with Malachy Mood that MD is recommending hydralazine 25mg  BID. She also now needs to know if hydralazine 10mg  prn is to be given prn QD or prn BID. She states they had been giving her 10mg  prn with each 25mg  dose if her SBP > 180. Parameters are also needed. Apologized that her medications were not straightened out at her visit on 07/15/17 - DNP impression and plan differ from AVS instructions.   Sent back to MD

## 2017-07-17 NOTE — Telephone Encounter (Signed)
Spoke with Malachy Mood at [facility] who reports patient is currently taking hydralazine 25mg  BID. She is wondering about PRN dose of hydralazine 10mg .   Explained that her DNP note from 2/6, it states to "continue" current hydralazine 25mg  daily and take hydralazine 10mg  PRN for SBP > 160.   Explained there was discrepancy between the dose we had on her chart and what [facility] has on their records. Malachy Mood will check and see how long patient has been on BID dosing and call back with info.

## 2017-07-17 NOTE — Telephone Encounter (Signed)
Follow up    [name] at [facility] is returning call. Please call to discuss.

## 2017-07-17 NOTE — Telephone Encounter (Signed)
BID

## 2017-07-17 NOTE — Telephone Encounter (Signed)
Med list updated.   Attempted to call [facility]. She is out to lunch

## 2017-07-17 NOTE — Telephone Encounter (Signed)
New message  Malachy Mood from [facility]  Clarification needed of frequency of hydrALAZINE (APRESOLINE) 25 MG tablet.  Pt c/o medication issue:  1. Name of Medication: hydrALAZINE (APRESOLINE) 25 MG tablet  2. How are you currently taking this medication (dosage and times per day)? Take 1 tablet (25 mg total) by mouth 2 (two) times daily as needed (IF SBP>160).  3. Are you having a reaction (difficulty breathing--STAT)? no  4. What is your medication issue? Clarification needed on frequency

## 2017-07-20 ENCOUNTER — Telehealth: Payer: Self-pay | Admitting: Cardiology

## 2017-07-20 ENCOUNTER — Telehealth: Payer: Self-pay | Admitting: Adult Health

## 2017-07-20 NOTE — Telephone Encounter (Signed)
This is from a previous note.  The nurses should have this from discharge instructions.   Essential hypertension Her BP is fluctuating.  She's going to lie down if her blood pressure systolic is 90. If however it is greater than 180 she'll be given written instructions to get 10 mg when necessary hydralazine.       She could do this up to 3 times per day.

## 2017-07-20 NOTE — Telephone Encounter (Signed)
The nurse at [facility] needs to know specifically how many times a day the patient may take the prn Hydralazine and what the blood pressure parameters are to give it to the patient.

## 2017-07-20 NOTE — Telephone Encounter (Signed)
New Message   [name] is  Reaching back out in reference to hydralazine 25mg . She needs to know how many tablets and how often. Please call to discuss. If not available please speak with [place].    Pt c/o medication issue:  1. Name of Medication: hydralazine 25mg   2. How are you currently taking this medication (dosage and times per day)?   3. Are you having a reaction (difficulty breathing--STAT)?none  4. What is your medication issue? See notes above

## 2017-07-20 NOTE — Telephone Encounter (Signed)
Faxed this note with medication clarifications to [facility] [phone]

## 2017-07-20 NOTE — Telephone Encounter (Signed)
Disregard Message

## 2017-07-21 NOTE — Telephone Encounter (Signed)
[name] has been made aware of the instructions for the Hydralazine. She verbalized her understanding.

## 2017-07-22 ENCOUNTER — Telehealth: Payer: Self-pay

## 2017-07-22 DIAGNOSIS — M353 Polymyalgia rheumatica: Secondary | ICD-10-CM | POA: Diagnosis not present

## 2017-07-22 DIAGNOSIS — I7300 Raynaud's syndrome without gangrene: Secondary | ICD-10-CM | POA: Diagnosis not present

## 2017-07-22 DIAGNOSIS — I10 Essential (primary) hypertension: Secondary | ICD-10-CM

## 2017-07-22 DIAGNOSIS — M150 Primary generalized (osteo)arthritis: Secondary | ICD-10-CM | POA: Diagnosis not present

## 2017-07-22 DIAGNOSIS — M2550 Pain in unspecified joint: Secondary | ICD-10-CM | POA: Diagnosis not present

## 2017-07-22 DIAGNOSIS — Z681 Body mass index (BMI) 19 or less, adult: Secondary | ICD-10-CM | POA: Diagnosis not present

## 2017-07-22 NOTE — Telephone Encounter (Signed)
Received message from CT scheduling stating You put in an order for [facility] for CT Angio ABd w it needs to be a CT Angio Abd and Pel W I  sent Curt Bears a message today but i have not heard from her are you with her to see if i can change this order. Informed her we needed a CTA of her abdomen/mesenteric artery per Dr Gwenlyn Found. She states yes and that is a CT Angio Abdomen and Pelvis per protocol. Entered order as stated above, cancelled previous CT ordered.

## 2017-07-23 ENCOUNTER — Inpatient Hospital Stay: Admission: RE | Admit: 2017-07-23 | Payer: Self-pay | Source: Ambulatory Visit

## 2017-07-23 ENCOUNTER — Ambulatory Visit (INDEPENDENT_AMBULATORY_CARE_PROVIDER_SITE_OTHER)
Admission: RE | Admit: 2017-07-23 | Discharge: 2017-07-23 | Disposition: A | Discharge status: Home / Self Care | Payer: PPO | Source: Ambulatory Visit | Attending: Adult Health | Admitting: Adult Health

## 2017-07-23 DIAGNOSIS — I10 Essential (primary) hypertension: Secondary | ICD-10-CM

## 2017-07-23 DIAGNOSIS — K5900 Constipation, unspecified: Secondary | ICD-10-CM | POA: Diagnosis not present

## 2017-07-23 MED ORDER — IOPAMIDOL (ISOVUE-370) INJECTION 76%
100.0000 mL | Freq: Once | INTRAVENOUS | Status: AC | PRN
  Administered 2017-07-23: 80 mL via INTRAVENOUS

## 2017-07-28 ENCOUNTER — Encounter: Payer: Self-pay | Admitting: *Deleted

## 2017-07-28 ENCOUNTER — Ambulatory Visit (INDEPENDENT_AMBULATORY_CARE_PROVIDER_SITE_OTHER): Payer: PPO

## 2017-07-28 DIAGNOSIS — I10 Essential (primary) hypertension: Secondary | ICD-10-CM

## 2017-07-28 NOTE — Progress Notes (Signed)
Patient ID: [name], female   DOB: 01/24/1932, 84 y.o.   MRN: [id number]  24 hour ambulatory blood pressure monitor applied to patient using small adult cuff.

## 2017-07-30 ENCOUNTER — Inpatient Hospital Stay: Payer: PPO

## 2017-07-30 ENCOUNTER — Inpatient Hospital Stay: Payer: PPO | Attending: Oncology | Admitting: Nurse Practitioner

## 2017-07-30 ENCOUNTER — Telehealth: Payer: Self-pay | Admitting: Oncology

## 2017-07-30 VITALS — BP 129/53 | HR 62 | Temp 98.1°F | Resp 17 | Ht 63.0 in | Wt 96.0 lb

## 2017-07-30 DIAGNOSIS — D709 Neutropenia, unspecified: Secondary | ICD-10-CM | POA: Insufficient documentation

## 2017-07-30 DIAGNOSIS — M8580 Other specified disorders of bone density and structure, unspecified site: Secondary | ICD-10-CM | POA: Insufficient documentation

## 2017-07-30 DIAGNOSIS — D630 Anemia in neoplastic disease: Secondary | ICD-10-CM | POA: Insufficient documentation

## 2017-07-30 DIAGNOSIS — C9000 Multiple myeloma not having achieved remission: Secondary | ICD-10-CM

## 2017-07-30 DIAGNOSIS — K5900 Constipation, unspecified: Secondary | ICD-10-CM | POA: Diagnosis not present

## 2017-07-30 DIAGNOSIS — C9001 Multiple myeloma in remission: Secondary | ICD-10-CM

## 2017-07-30 LAB — CBC WITH DIFFERENTIAL (CANCER CENTER ONLY)
BASOS ABS: 0 10*3/uL (ref 0.0–0.1)
Basophils Relative: 1 %
Eosinophils Absolute: 0 10*3/uL (ref 0.0–0.5)
Eosinophils Relative: 2 %
HCT: 30.2 % — ABNORMAL LOW (ref 34.8–46.6)
Hemoglobin: 10.1 g/dL — ABNORMAL LOW (ref 11.6–15.9)
LYMPHS PCT: 38 %
Lymphs Abs: 0.9 10*3/uL (ref 0.9–3.3)
MCH: 33.2 pg (ref 25.1–34.0)
MCHC: 33.6 g/dL (ref 31.5–36.0)
MCV: 98.9 fL (ref 79.5–101.0)
MONO ABS: 0.4 10*3/uL (ref 0.1–0.9)
Monocytes Relative: 19 %
Neutro Abs: 0.9 10*3/uL — ABNORMAL LOW (ref 1.5–6.5)
Neutrophils Relative %: 40 %
Platelet Count: 255 10*3/uL (ref 145–400)
RBC: 3.05 MIL/uL — AB (ref 3.70–5.45)
RDW: 14.2 % (ref 11.2–14.5)
WBC Count: 2.3 10*3/uL — ABNORMAL LOW (ref 3.9–10.3)

## 2017-07-30 LAB — LACTATE DEHYDROGENASE: LDH: 184 U/L (ref 125–245)

## 2017-07-30 NOTE — Progress Notes (Addendum)
  [name]   Diagnosis: Indolent multiple myeloma  INTERVAL HISTORY:   [name] returns as scheduled.  Energy level remains poor.  She tends to take a nap after lunch.  She denies pain.  No recent illnesses or infections.  She has intermittent constipation.  Objective:  Vital signs in last 24 hours:  Blood pressure (!) 129/53, pulse 62, temperature 98.1 F (36.7 C), temperature source Oral, resp. rate 17, height _0  (1.6 m), weight 96 lb (43.5 kg), SpO2 100 %.    HEENT: No thrush or ulcers. Resp: Distant breath sounds.  No respiratory distress. Cardio: Regular rate and rhythm. GI: Abdomen soft and nontender.  No hepatosplenomegaly.  No mass. Vascular: Trace lower leg edema bilaterally.   Lab Results:  Lab Results  Component Value Date   WBC 2.3 (L) 07/30/2017   HGB 9.6 (L) 07/07/2017   HCT 30.2 (L) 07/30/2017   MCV 98.9 07/30/2017   PLT 255 07/30/2017   NEUTROABS 0.9 (L) 07/30/2017   07/07/2017-M spike 2.5, IgG 2475 02/24/2017-M spike 2.3, IgG 2290 Imaging: Bone survey 07/07/2017-no evidence of multiple myeloma; stable appearance from prior exams.   Medications: I have reviewed the patient's current medications.  Assessment/Plan: 1. Indolent multiple myeloma, asymptomatic. No clinical evidence of disease progression. A metastatic bone survey in January 2019 was negative for lytic lesions.  2. Mild anemia secondary to multiple myeloma-progressive 3. Neutropenia-likely secondary to myeloma 4. History of coronary artery disease. 5. Osteopenia. 6. History of multiple urinary tract infections, followed by Dr. Diona Fanti. 7. "Raynaud" syndrome. 8. Chronic back pain. 9. History of hyponatremia. 10. Right foot surgery February 2014 11. Bilateral ovarian cysts-followed by GYN oncology 12. Elevated liver enzymes December 2018; liver enzymes normal on labs done 07/07/2017.     Disposition: [name] has indolent multiple myeloma.   There is no clinical/laboratory/radiographic evidence of disease progression.  Plan to continue to follow with observation.  We discussed the importance of her seeking evaluation for signs/symptoms suggestive of an infection.  She and her daughter expressed their understanding.  She will return for repeat labs and a follow-up visit in 5 months.  Patient seen with Dr. Benay Spice.  Ned Card ANP/GNP-BC   07/30/2017  12:20 PM This was a shared visit with Ned Card.  [name] has indolent multiple myeloma.  There is no evidence for progression of the myeloma.  I suspect her GI symptoms are unrelated to the myeloma diagnosis.  She has chronic neutropenia, progressive over recent years.  She knows to seek medical attention for symptoms of an infection.  The neutropenia is likely secondary to myeloma.  We discussed the risk and benefits of treating the myeloma now.  She is comfortable with observation given the lack of other symptoms related to myeloma and her comorbid conditions.  Julieanne Manson, MD

## 2017-07-30 NOTE — Telephone Encounter (Signed)
Appointments scheduled AVS/Calendar printed per 2/21 los °

## 2017-07-31 ENCOUNTER — Encounter: Payer: Self-pay | Admitting: Cardiovascular Disease

## 2017-07-31 ENCOUNTER — Ambulatory Visit: Payer: PPO | Admitting: Cardiovascular Disease

## 2017-07-31 VITALS — BP 146/64 | HR 64 | Ht 63.0 in | Wt 95.4 lb

## 2017-07-31 DIAGNOSIS — I10 Essential (primary) hypertension: Secondary | ICD-10-CM | POA: Diagnosis not present

## 2017-07-31 DIAGNOSIS — R109 Unspecified abdominal pain: Secondary | ICD-10-CM | POA: Insufficient documentation

## 2017-07-31 NOTE — Progress Notes (Signed)
[name] referred to me by Jory Sims nurse practitioner for mesenteric ischemia. She has been seen by Dr. Earlie Raveling, gastroenterologist. She had a mesenteric arteriogram performed by Dr. Donzetta Matters 04/15/17 that was entirely normal except for a 60% left renal artery stenosis. A recent CTA did show compression of the celiac axis probably from a median arcuate ligament but all other mesenteric vessels were patent making it unlikely that her symptoms were related to mesenteric ischemia. No further vascular workup is necessary at this time referable to her abdominal pain.  Lorretta Harp, M.D., [place], [facility], [place] [address] [place], [place]  [address]  [phone] 07/31/2017 12:17 PM

## 2017-07-31 NOTE — Patient Instructions (Signed)
Medication Instructions: Your physician recommends that you continue on your current medications as directed. Please refer to the Current Medication list given to you today.  If you need a refill on your cardiac medications before your next appointment, please call your pharmacy.   Follow-Up: Your physician wants you to follow-up in: as needed with Dr. Gwenlyn Found.   Thank you for choosing [facility] at [facility]!!

## 2017-07-31 NOTE — Assessment & Plan Note (Signed)
Mr. Kiesling referred to me by Jory Sims nurse practitioner for mesenteric ischemia. She has been seen by Dr. Earlie Raveling, gastroenterologist. She had a mesenteric arteriogram performed by Dr. Donzetta Matters 04/15/17 that was entirely normal except for a 60% left renal artery stenosis. A recent CTA did show compression of the celiac axis probably from a median arcuate ligament but all other mesenteric vessels were patent making it unlikely that her symptoms were related to mesenteric ischemia. No further vascular workup is necessary at this time referable to her abdominal pain.

## 2017-08-03 ENCOUNTER — Telehealth: Payer: Self-pay | Admitting: Cardiovascular Disease

## 2017-08-03 NOTE — Telephone Encounter (Signed)
New Message  Pt's daughter calling because the pts did a 24 hr monitor but they never received the results and also if any of her meds need to be changed. Please call

## 2017-08-03 NOTE — Telephone Encounter (Signed)
Patient's daughter called in for 24 hour monitor results (per DPR). Message will be routed to the provider.

## 2017-08-04 NOTE — Telephone Encounter (Signed)
She does have some BP spikes on this monitor.  However, most of the BPs are controlled and the BP fluctuates quite a bit.  I would not want to change any of her scheduled meds.  The elevated BPs come back down very quickly when they occur.  There were no alarming BPs noted.

## 2017-08-04 NOTE — Telephone Encounter (Signed)
Spoke with pt daughter and advised her of Dr Percival Spanish recommendation, pt want me to faxed his reply to pt assisted living at [facility], advised pt I will faxed his reply to them.

## 2017-08-04 NOTE — Telephone Encounter (Signed)
Follow up    Patient daughter returning call for results.

## 2017-08-04 NOTE — Telephone Encounter (Signed)
Leave message for pt to call back 

## 2017-08-06 DIAGNOSIS — I10 Essential (primary) hypertension: Secondary | ICD-10-CM | POA: Diagnosis not present

## 2017-08-06 DIAGNOSIS — I25700 Atherosclerosis of coronary artery bypass graft(s), unspecified, with unstable angina pectoris: Secondary | ICD-10-CM | POA: Diagnosis not present

## 2017-08-06 DIAGNOSIS — E785 Hyperlipidemia, unspecified: Secondary | ICD-10-CM | POA: Diagnosis not present

## 2017-08-06 DIAGNOSIS — I2510 Atherosclerotic heart disease of native coronary artery without angina pectoris: Secondary | ICD-10-CM | POA: Diagnosis not present

## 2017-08-18 DIAGNOSIS — K5904 Chronic idiopathic constipation: Secondary | ICD-10-CM | POA: Diagnosis not present

## 2017-08-29 ENCOUNTER — Observation Stay (HOSPITAL_COMMUNITY)
Admission: EM | Admit: 2017-08-29 | Discharge: 2017-08-30 | Payer: PPO | Attending: Internal Medicine | Admitting: Internal Medicine

## 2017-08-29 ENCOUNTER — Emergency Department (HOSPITAL_COMMUNITY): Payer: PPO

## 2017-08-29 ENCOUNTER — Encounter (HOSPITAL_COMMUNITY): Payer: Self-pay

## 2017-08-29 ENCOUNTER — Other Ambulatory Visit: Payer: Self-pay

## 2017-08-29 DIAGNOSIS — Z7982 Long term (current) use of aspirin: Secondary | ICD-10-CM | POA: Insufficient documentation

## 2017-08-29 DIAGNOSIS — I10 Essential (primary) hypertension: Secondary | ICD-10-CM | POA: Diagnosis not present

## 2017-08-29 DIAGNOSIS — Z8249 Family history of ischemic heart disease and other diseases of the circulatory system: Secondary | ICD-10-CM | POA: Diagnosis not present

## 2017-08-29 DIAGNOSIS — I700 Atherosclerosis of aorta: Secondary | ICD-10-CM | POA: Diagnosis not present

## 2017-08-29 DIAGNOSIS — I083 Combined rheumatic disorders of mitral, aortic and tricuspid valves: Secondary | ICD-10-CM | POA: Insufficient documentation

## 2017-08-29 DIAGNOSIS — F329 Major depressive disorder, single episode, unspecified: Secondary | ICD-10-CM | POA: Diagnosis not present

## 2017-08-29 DIAGNOSIS — Z79890 Hormone replacement therapy (postmenopausal): Secondary | ICD-10-CM | POA: Diagnosis not present

## 2017-08-29 DIAGNOSIS — Z951 Presence of aortocoronary bypass graft: Secondary | ICD-10-CM | POA: Diagnosis not present

## 2017-08-29 DIAGNOSIS — E785 Hyperlipidemia, unspecified: Secondary | ICD-10-CM | POA: Diagnosis present

## 2017-08-29 DIAGNOSIS — R079 Chest pain, unspecified: Secondary | ICD-10-CM | POA: Diagnosis not present

## 2017-08-29 DIAGNOSIS — F411 Generalized anxiety disorder: Secondary | ICD-10-CM | POA: Diagnosis not present

## 2017-08-29 DIAGNOSIS — Z955 Presence of coronary angioplasty implant and graft: Secondary | ICD-10-CM | POA: Diagnosis not present

## 2017-08-29 DIAGNOSIS — J439 Emphysema, unspecified: Secondary | ICD-10-CM | POA: Diagnosis not present

## 2017-08-29 DIAGNOSIS — I25810 Atherosclerosis of coronary artery bypass graft(s) without angina pectoris: Secondary | ICD-10-CM | POA: Insufficient documentation

## 2017-08-29 DIAGNOSIS — J4530 Mild persistent asthma, uncomplicated: Secondary | ICD-10-CM | POA: Diagnosis not present

## 2017-08-29 DIAGNOSIS — I2510 Atherosclerotic heart disease of native coronary artery without angina pectoris: Secondary | ICD-10-CM | POA: Diagnosis present

## 2017-08-29 DIAGNOSIS — R600 Localized edema: Secondary | ICD-10-CM | POA: Diagnosis not present

## 2017-08-29 DIAGNOSIS — Z79899 Other long term (current) drug therapy: Secondary | ICD-10-CM | POA: Diagnosis not present

## 2017-08-29 DIAGNOSIS — Z7902 Long term (current) use of antithrombotics/antiplatelets: Secondary | ICD-10-CM | POA: Diagnosis not present

## 2017-08-29 DIAGNOSIS — C9001 Multiple myeloma in remission: Secondary | ICD-10-CM | POA: Insufficient documentation

## 2017-08-29 DIAGNOSIS — J45909 Unspecified asthma, uncomplicated: Secondary | ICD-10-CM | POA: Diagnosis present

## 2017-08-29 DIAGNOSIS — I252 Old myocardial infarction: Secondary | ICD-10-CM | POA: Diagnosis not present

## 2017-08-29 DIAGNOSIS — R072 Precordial pain: Secondary | ICD-10-CM

## 2017-08-29 DIAGNOSIS — F0390 Unspecified dementia without behavioral disturbance: Secondary | ICD-10-CM | POA: Diagnosis not present

## 2017-08-29 DIAGNOSIS — K219 Gastro-esophageal reflux disease without esophagitis: Secondary | ICD-10-CM | POA: Insufficient documentation

## 2017-08-29 DIAGNOSIS — R0789 Other chest pain: Secondary | ICD-10-CM | POA: Diagnosis not present

## 2017-08-29 DIAGNOSIS — F32A Depression, unspecified: Secondary | ICD-10-CM | POA: Diagnosis present

## 2017-08-29 DIAGNOSIS — F419 Anxiety disorder, unspecified: Secondary | ICD-10-CM | POA: Diagnosis not present

## 2017-08-29 DIAGNOSIS — E041 Nontoxic single thyroid nodule: Secondary | ICD-10-CM | POA: Diagnosis not present

## 2017-08-29 DIAGNOSIS — R0602 Shortness of breath: Secondary | ICD-10-CM | POA: Diagnosis not present

## 2017-08-29 LAB — TROPONIN I
Troponin I: <0.03 ng/mL (ref <0.03)
Troponin I: <0.03 ng/mL (ref <0.03)

## 2017-08-29 LAB — COMPREHENSIVE METABOLIC PANEL
ALBUMIN: 3.9 g/dL (ref 3.5–5.0)
ALK PHOS: 45 U/L (ref 38–126)
ALT: 15 U/L (ref 14–54)
AST: 35 U/L (ref 15–41)
Anion gap: 11 (ref 5–15)
BUN: 14 mg/dL (ref 6–20)
CHLORIDE: 95 mmol/L — AB (ref 101–111)
CO2: 26 mmol/L (ref 22–32)
Calcium: 9.1 mg/dL (ref 8.9–10.3)
Creatinine, Ser: 0.72 mg/dL (ref 0.44–1.00)
GFR calc Af Amer: >60 mL/min (ref >60)
GFR calc non Af Amer: >60 mL/min (ref >60)
GLUCOSE: 90 mg/dL (ref 65–99)
POTASSIUM: 3.4 mmol/L — AB (ref 3.5–5.1)
SODIUM: 132 mmol/L — AB (ref 135–145)
Total Bilirubin: 0.6 mg/dL (ref 0.3–1.2)
Total Protein: 8.7 g/dL — ABNORMAL HIGH (ref 6.5–8.1)

## 2017-08-29 LAB — CBC WITH DIFFERENTIAL/PLATELET
Basophils Absolute: 0 10*3/uL (ref 0.0–0.1)
Basophils Relative: 0 %
Eosinophils Absolute: 0.1 10*3/uL (ref 0.0–0.7)
Eosinophils Relative: 4 %
HEMATOCRIT: 32.6 % — AB (ref 36.0–46.0)
Hemoglobin: 10.9 g/dL — ABNORMAL LOW (ref 12.0–15.0)
LYMPHS PCT: 41 %
Lymphs Abs: 0.9 10*3/uL (ref 0.7–4.0)
MCH: 32.6 pg (ref 26.0–34.0)
MCHC: 33.4 g/dL (ref 30.0–36.0)
MCV: 97.6 fL (ref 78.0–100.0)
MONO ABS: 0.2 10*3/uL (ref 0.1–1.0)
MONOS PCT: 11 %
Neutro Abs: 1 10*3/uL — ABNORMAL LOW (ref 1.7–7.7)
Neutrophils Relative %: 44 %
Platelets: 277 10*3/uL (ref 150–400)
RBC: 3.34 MIL/uL — ABNORMAL LOW (ref 3.87–5.11)
RDW: 14 % (ref 11.5–15.5)
WBC: 2.3 10*3/uL — ABNORMAL LOW (ref 4.0–10.5)

## 2017-08-29 LAB — TSH: TSH: 3.07 u[IU]/mL (ref 0.350–4.500)

## 2017-08-29 LAB — MRSA PCR SCREENING: MRSA BY PCR: NEGATIVE

## 2017-08-29 LAB — I-STAT TROPONIN, ED
Troponin i, poc: 0 ng/mL (ref 0.00–0.08)
Troponin i, poc: 0 ng/mL (ref 0.00–0.08)

## 2017-08-29 LAB — D-DIMER, QUANTITATIVE (NOT AT ARMC): D DIMER QUANT: 0.82 ug{FEU}/mL — AB (ref 0.00–0.50)

## 2017-08-29 MED ORDER — ONDANSETRON HCL 4 MG/2ML IJ SOLN: 4.0000 mg | Freq: Four times a day (QID) | INTRAMUSCULAR | Status: DC | PRN

## 2017-08-29 MED ORDER — GI COCKTAIL ~~LOC~~
30.0000 mL | Freq: Four times a day (QID) | ORAL | Status: DC | PRN
  Administered 2017-08-29: 30 mL via ORAL
  Filled 2017-08-29: qty 30

## 2017-08-29 MED ORDER — ENOXAPARIN SODIUM 40 MG/0.4ML ~~LOC~~ SOLN: 40.0000 mg | SUBCUTANEOUS | Status: DC

## 2017-08-29 MED ORDER — MONTELUKAST SODIUM 10 MG PO TABS
10.0000 mg | ORAL_TABLET | Freq: Every day | ORAL | Status: DC
  Administered 2017-08-29: 10 mg via ORAL
  Filled 2017-08-29: qty 1

## 2017-08-29 MED ORDER — POLYSACCHARIDE IRON COMPLEX 150 MG PO CAPS
150.0000 mg | ORAL_CAPSULE | Freq: Two times a day (BID) | ORAL | Status: DC
  Administered 2017-08-29 – 2017-08-30 (×2): 150 mg via ORAL
  Filled 2017-08-29 (×2): qty 1

## 2017-08-29 MED ORDER — DOCUSATE SODIUM 100 MG PO CAPS
100.0000 mg | ORAL_CAPSULE | Freq: Two times a day (BID) | ORAL | Status: DC
  Administered 2017-08-29 – 2017-08-30 (×2): 100 mg via ORAL
  Filled 2017-08-29 (×2): qty 1

## 2017-08-29 MED ORDER — IOPAMIDOL (ISOVUE-370) INJECTION 76%
INTRAVENOUS | Status: AC
  Administered 2017-08-29: 50 mL
  Filled 2017-08-29: qty 50

## 2017-08-29 MED ORDER — MORPHINE SULFATE (PF) 4 MG/ML IV SOLN: 2.0000 mg | INTRAVENOUS | Status: DC | PRN

## 2017-08-29 MED ORDER — ISOSORBIDE MONONITRATE ER 30 MG PO TB24
60.0000 mg | ORAL_TABLET | Freq: Every day | ORAL | Status: DC
  Administered 2017-08-29 – 2017-08-30 (×2): 60 mg via ORAL
  Filled 2017-08-29 (×3): qty 2

## 2017-08-29 MED ORDER — ACETAMINOPHEN ER 650 MG PO TBCR: 1300.0000 mg | EXTENDED_RELEASE_TABLET | Freq: Two times a day (BID) | ORAL | Status: DC

## 2017-08-29 MED ORDER — ASPIRIN EC 81 MG PO TBEC
162.0000 mg | DELAYED_RELEASE_TABLET | Freq: Every day | ORAL | Status: DC
  Administered 2017-08-30: 162 mg via ORAL
  Filled 2017-08-29: qty 2

## 2017-08-29 MED ORDER — LEVOCETIRIZINE DIHYDROCHLORIDE 5 MG PO TABS: 5.0000 mg | ORAL_TABLET | Freq: Every day | ORAL | Status: DC

## 2017-08-29 MED ORDER — CALCIUM CARB-CHOLECALCIFEROL 500-600 MG-UNIT PO TABS: 1.0000 | ORAL_TABLET | Freq: Every day | ORAL | Status: DC

## 2017-08-29 MED ORDER — POTASSIUM CHLORIDE CRYS ER 20 MEQ PO TBCR
40.0000 meq | EXTENDED_RELEASE_TABLET | Freq: Once | ORAL | Status: AC
  Administered 2017-08-29: 40 meq via ORAL
  Filled 2017-08-29: qty 2

## 2017-08-29 MED ORDER — CLOPIDOGREL BISULFATE 75 MG PO TABS
75.0000 mg | ORAL_TABLET | Freq: Every day | ORAL | Status: DC
  Administered 2017-08-29 – 2017-08-30 (×2): 75 mg via ORAL
  Filled 2017-08-29 (×2): qty 1

## 2017-08-29 MED ORDER — ASPIRIN EC 81 MG PO TBEC
81.0000 mg | DELAYED_RELEASE_TABLET | Freq: Every day | ORAL | Status: DC
  Administered 2017-08-29: 81 mg via ORAL
  Filled 2017-08-29: qty 1

## 2017-08-29 MED ORDER — LORATADINE 10 MG PO TABS
10.0000 mg | ORAL_TABLET | Freq: Every day | ORAL | Status: DC
  Administered 2017-08-29: 10 mg via ORAL
  Filled 2017-08-29: qty 1

## 2017-08-29 MED ORDER — HYDRALAZINE HCL 25 MG PO TABS
25.0000 mg | ORAL_TABLET | Freq: Two times a day (BID) | ORAL | Status: DC
  Administered 2017-08-29 – 2017-08-30 (×3): 25 mg via ORAL
  Filled 2017-08-29 (×3): qty 1

## 2017-08-29 MED ORDER — CARVEDILOL 3.125 MG PO TABS
3.1250 mg | ORAL_TABLET | Freq: Two times a day (BID) | ORAL | Status: DC
  Administered 2017-08-29 – 2017-08-30 (×3): 3.125 mg via ORAL
  Filled 2017-08-29 (×3): qty 1

## 2017-08-29 MED ORDER — ALPRAZOLAM 0.25 MG PO TABS
0.2500 mg | ORAL_TABLET | Freq: Two times a day (BID) | ORAL | Status: DC | PRN
  Administered 2017-08-29: 0.25 mg via ORAL
  Filled 2017-08-29: qty 1

## 2017-08-29 MED ORDER — ALUM & MAG HYDROXIDE-SIMETH 200-200-20 MG/5ML PO SUSP
15.0000 mL | Freq: Once | ORAL | Status: AC
  Administered 2017-08-29: 15 mL via ORAL
  Filled 2017-08-29: qty 30

## 2017-08-29 MED ORDER — ACETAMINOPHEN 325 MG PO TABS: 650.0000 mg | ORAL_TABLET | ORAL | Status: DC | PRN

## 2017-08-29 NOTE — H&P (Addendum)
History and Physical    [name] VQQ:[id number] DOB: 1932-01-02 DOA: 08/29/2017  PCP: [facility] [place] Patient coming from: home/facility  Chief Complaint: dyspnea/chest pain  HPI: [name] is a nervous 84 y.o. female with medical history significant for CAD, hypertension, hyperlipidemia, generalized anxiety disorder presents to the emergency Department chief complaint dyspnea and chest pain. Initial evaluation is reassuring [facility] are asked to admit.  Information is obtained from the patient. She reports she was in her usual state of health suddenly she was awakened from her sleep this morning with sudden onset of acute shortness of breath. Associated symptoms include hest pain located in her central chest area described as sharp and constant. She states that the chest pain wasn't as bad as the shortness of breath. She reports she has similar episode 2 days prior but the symptoms improved quickly so she did not seek treatment. This morning she took 324 mg of aspirin as well as 3 nitroglycerin with minimal improvement. She called EMS who provided her with 2 additional nitroglycerin. She reports that the pain did not go completely away until she finished her [place] sandwich that she received in the emergency department. She states "I was hungry". Denies headache dizziness syncope or near-syncope. She denies abdominal pain nausea vomiting diarrhea constipation melena bright red blood per rectum. She denies any fever chills dysuria hematuria. She denies any cough. She reports she has chronic lower extremity edema that is unchanged. She reports that she receives 0.5 mg of Ativan every night "for sleep" and that she took an additional prior to arriving to the ED    ED Course: in the emergency department she is hypertensive not hypoxic she is nontoxic appearing  Review of Systems: As per HPI otherwise all other systems reviewed and are negative.   Ambulatory  Status:ambulates independently is independent with ADLs  Past Medical History:  Diagnosis Date  . Chondrocostal junction syndrome   . Chronic fatigue   . Dementia    without Behavioral Disturbance  . GERD (gastroesophageal reflux disease)   . Hx of CABG   . Hypercholesteremia   . Hyperlipidemia    GOAL LDL <70  . Hypertension   . IBS (irritable bowel syndrome)   . Incontinent of urine   . Major depressive disorder   . Mild chronic anemia   . Mixed incontinence   . Multiple myeloma (HCC)    not having achieved remission  . NSTEMI (non-ST elevated myocardial infarction) ([place]) 01/31/2017  . Osteopenia   . Ovarian cyst, bilateral    Rt complex  . Overactive bladder   . Panic disorder   . Radiculopathy   . Raynaud's syndrome   . Raynauds syndrome   . Restless legs syndrome   . Spinal stenosis of lumbar region   . Unsteadiness on feet     Past Surgical History:  Procedure Laterality Date  . BUNIONECTOMY WITH HAMMERTOE RECONSTRUCTION Right 08/04/2012   Procedure:  HAMMERTOE RECONSTRUCTION;  Surgeon: Colin Rhein, MD;  Location: [place];  Service: Orthopedics;  Laterality: Right;  HAMMER TOE RECONSTRUCTION PROBABLE FLEXOR DIGITORUM LONGUS TO PROXIMAL PHALANX TRANSFER LATERALIZED   . CAPSULOTOMY Right 08/04/2012   Procedure: CAPSULOTOMY;  Surgeon: Colin Rhein, MD;  Location: [place];  Service: Orthopedics;  Laterality: Right;  RIGHT 2ND TOE METATARSOPHALANGEAL JOINT DORSAL CAPSULOTOMY   . CATARACT EXTRACTION, BILATERAL    . COLONOSCOPY    . CORONARY ANGIOPLASTY WITH STENT PLACEMENT  2014  2 stents  . CORONARY ARTERY BYPASS GRAFT  2007   LIMA to LAD, SVG to RCA  non-ST segment MI 08/2009 (Dr. Tamala Julian)  . CORONARY STENT INTERVENTION N/A 02/02/2017   Procedure: CORONARY STENT INTERVENTION;  Surgeon: Belva Crome, MD;  Location: [place] [facility];  Service: Cardiovascular;  Laterality: N/A;  Prox CFX  . DILATION AND CURETTAGE OF UTERUS    .  EYE SURGERY     both cataracts  . LEFT HEART CATH AND CORS/GRAFTS ANGIOGRAPHY N/A 02/02/2017   Procedure: LEFT HEART CATH AND CORS/GRAFTS ANGIOGRAPHY;  Surgeon: Belva Crome, MD;  Location: [place] [facility];  Service: Cardiovascular;  Laterality: N/A;  . LEFT HEART CATHETERIZATION WITH CORONARY/GRAFT ANGIOGRAM N/A 07/09/2012   Procedure: LEFT HEART CATHETERIZATION WITH Beatrix Fetters;  Surgeon: Sinclair Grooms, MD;  Location: [facility];  Service: Cardiovascular;  Laterality: N/A;  . TONSILLECTOMY AND ADENOIDECTOMY    . UPPER GASTROINTESTINAL ENDOSCOPY    . VISCERAL ANGIOGRAPHY N/A 04/15/2017   Procedure: VISCERAL ANGIOGRAPHY;  Surgeon: Waynetta Sandy, MD;  Location: [place] [facility];  Service: Cardiovascular;  Laterality: N/A;    Social History   Socioeconomic History  . Marital status: Married    Spouse name: Not on file  . Number of children: Not on file  . Years of education: Not on file  . Highest education level: Not on file  Occupational History  . Not on file  Social Needs  . Financial resource strain: Not on file  . Food insecurity:    Worry: Not on file    Inability: Not on file  . Transportation needs:    Medical: Not on file    Non-medical: Not on file  Tobacco Use  . Smoking status: Never Smoker  . Smokeless tobacco: Never Used  Substance and Sexual Activity  . Alcohol use: Yes    Comment: occasional  . Drug use: No  . Sexual activity: Never  Lifestyle  . Physical activity:    Days per week: Not on file    Minutes per session: Not on file  . Stress: Not on file  Relationships  . Social connections:    Talks on phone: Not on file    Gets together: Not on file    Attends religious service: Not on file    Active member of club or organization: Not on file    Attends meetings of clubs or organizations: Not on file    Relationship status: Not on file  . Intimate partner violence:    Fear of current or ex partner: Not on file     Emotionally abused: Not on file    Physically abused: Not on file    Forced sexual activity: Not on file  Other Topics Concern  . Not on file  Social History Narrative  . Not on file    Allergies  Allergen Reactions  . Butazolidin [Phenylbutazone] Swelling and Rash  . Cephalosporins Other (See Comments)    unknown  . Codeine Nausea Only  . Levaquin [Levofloxacin] Shortness Of Breath  . Macrodantin Other (See Comments)    Double vision  . Morphine And Related Other (See Comments)    hallucinations  . Trimethoprim Nausea Only  . Buprenorphine     From MAR  . Cilostazol Other (See Comments)    Stomach upset  . Dexilant [Dexlansoprazole] Diarrhea  . Diclofenac     Documented on MAR  . Erythromycin Nausea And Vomiting  All "mycin" drugs  . Morphine   . Nitrofurantoin     Double vision  . Simvastatin Other (See Comments)    Muscle aches   . Sulfa Antibiotics Other (See Comments)    unknown  . Vesicare [Solifenacin] Other (See Comments)    unknown  . Clindamycin/Lincomycin Other (See Comments)    Upset stomach  . Food Other (See Comments)    No spicy foods or Osker Ayoub pepper, raw onions - causes gerd  . Ibuprofen Hives and Rash  . Lincomycin Hcl Other (See Comments)    Upset stomach  . Penicillins Rash    Has patient had a PCN reaction causing immediate rash, facial/tongue/throat swelling, SOB or lightheadedness with hypotension: Yes Has patient had a PCN reaction causing severe rash involving mucus membranes or skin necrosis: No Has patient had a PCN reaction that required hospitalization No Has patient had a PCN reaction occurring within the last 10 years: No If all of the above answers are "NO", then may proceed with Cephalosporin use.   Lisbeth Ply [Fesoterodine Fumarate Er] Other (See Comments)    unknown  . Voltaren [Diclofenac Sodium] Rash    Family History  Problem Relation Age of Onset  . Heart attack Mother   . CAD Mother   . Obesity Sister   . Cancer  Paternal Aunt        breast  . Asthma Neg Hx   . Eczema Neg Hx   . Urticaria Neg Hx   . Allergic rhinitis Neg Hx   . Angioedema Neg Hx   . Atopy Neg Hx   . Immunodeficiency Neg Hx     Prior to Admission medications   Medication Sig Start Date End Date Taking? Authorizing Provider  acetaminophen (TYLENOL) 650 MG CR tablet Take 1,300 mg by mouth 2 (two) times daily.   Yes [provider]  aspirin EC 81 MG tablet Take 1 tablet (81 mg total) by mouth daily. 01/21/16  Yes Minus Breeding, MD  Calcium Carb-Cholecalciferol (CALCIUM 500 + D3) 500-600 MG-UNIT TABS Take 1 tablet by mouth daily.   Yes [provider]  carvedilol (COREG) 3.125 MG tablet Take 1 tablet (3.125 mg total) by mouth 2 (two) times daily with a meal. 02/11/17  Yes Barrett, Evelene Croon, PA-C  clopidogrel (PLAVIX) 75 MG tablet Take last dose of Brilinta 04/01/17 in evening. Take 4 tablets of Clopidogrel on 04/02/17 then take 1 tablet by mouth daily thereafter. Patient taking differently: Take 75 mg by mouth daily.  04/01/17  Yes Minus Breeding, MD  docusate sodium (COLACE) 100 MG capsule Take 1 capsule (100 mg total) by mouth daily as needed. Patient taking differently: Take 100 mg by mouth See admin instructions. Take 1 capsule (111m) by mouth daily and 1 capsule (1043m daily as needed. 02/03/17 02/03/18 Yes Simmons, Brittainy M, PA-C  hydrALAZINE (APRESOLINE) 10 MG tablet Take 10 mg by mouth every 8 (eight) hours.    Yes [provider]  hydrALAZINE (APRESOLINE) 25 MG tablet Take 25 mg by mouth 2 (two) times daily.   Yes [provider]  iron polysaccharides (NU-IRON) 150 MG capsule Take 150 mg by mouth 2 (two) times daily.    Yes [provider]  isosorbide mononitrate (IMDUR) 60 MG 24 hr tablet Take 60 mg by mouth daily.   Yes [provider]  levocetirizine (XYZAL) 5 MG tablet Take 1 tablet (5 mg total) by mouth every evening. Patient taking differently: Take 5 mg by mouth at  bedtime.  05/13/17  Yes Bardelas, Jose A, MD  LORazepam (ATIVAN) 0.5 MG tablet Take 0.25-0.5 mg by mouth See admin instructions. Take 0.25 mg in the morning and 0.6m at bedtime.  May take an additional 0.5 mg twice daily as needed for anxiety 01/09/17  Yes [provider]  Melatonin 5 MG TABS Take 5 mg by mouth at bedtime.   Yes [provider]  memantine (NAMENDA) 10 MG tablet Take 10 mg by mouth 2 (two) times daily.    Yes [provider]  mirabegron ER (MYRBETRIQ) 25 MG TB24 tablet Take 25 mg by mouth every morning.    Yes [provider]  montelukast (SINGULAIR) 10 MG tablet Take 10 mg by mouth at bedtime.    Yes [provider]  Multiple Vitamins-Minerals (CENTRUM SILVER 50+WOMEN) TABS Take 1 tablet by mouth daily.   Yes [provider]  nitroGLYCERIN (NITROSTAT) 0.4 MG SL tablet Place 1 tablet (0.4 mg total) under the tongue every 5 (five) minutes as needed for chest pain. Reported on 11/12/2015 02/03/17  Yes SLyda JesterM, PA-C  omeprazole (PRILOSEC) 20 MG capsule Take 20 mg by mouth 2 (two) times daily before a meal.   Yes [provider]  polyethylene glycol (MIRALAX / GLYCOLAX) packet Take 17 g by mouth daily.    Yes [provider]  PREMARIN vaginal cream Place 1 Applicatorful vaginally 2 (two) times a week.  05/05/17  Yes [provider]  Probiotic Product (VSL#3 PO) Take 1 packet by mouth every morning.   Yes [provider]  sertraline (ZOLOFT) 25 MG tablet Take 25-50 mg by mouth See admin instructions. Takes 251mpo AM and 5078mo PM.    Yes [provider]    Physical Exam: Vitals:   08/29/17 1300 08/29/17 1330 08/29/17 1415 08/29/17 1431  BP: (!) 162/75 (!) 152/92 (!) 196/70 (!) 188/90  Pulse: 64 63 65 66  Resp: _0 Temp:      TempSrc:      SpO2: 99% 99% 97%   Weight:      Height:         General:  Appears calm and comfortable slightly pale but certainly in no  acute distress Eyes:  PERRL, EOMI, normal lids, iris ENT:  grossly normal hearing, lips & tongue, mucous membranes of her mouth are moist and pink Neck:  no LAD, masses or thyromegaly Cardiovascular:  RRR, no m/r/g. No LE edema.  Respiratory:  CTA bilaterally, no w/r/r. Normal respiratory effort. Abdomen:  soft, ntnd, positive bowel sounds no guarding or rebounding Skin:  no rash or induration seen on limited exam Musculoskeletal:  grossly normal tone BUE/BLE, good ROM, no bony abnormality Psychiatric:  grossly normal mood and affect, speech fluent and appropriate, AOx3 Neurologic:  CN 2-12 grossly intact, moves all extremities in coordinated fashion, sensation intact  Labs on Admission: I have personally reviewed following labs and imaging studies  CBC: Recent Labs  Lab 08/29/17 0747  WBC 2.3*  NEUTROABS 1.0*  HGB 10.9*  HCT 32.6*  MCV 97.6  PLT [id number] Metabolic Panel: Recent Labs  Lab 08/29/17 0747  NA 132*  K 3.4*  CL 95*  CO2 26  GLUCOSE 90  BUN 14  CREATININE 0.72  CALCIUM 9.1   GFR: Estimated Creatinine Clearance: 35.8 mL/min (by C-G formula based on SCr of 0.72 mg/dL). Liver Function Tests: Recent Labs  Lab 08/29/17 0747  AST 35  ALT 15  ALKPHOS 45  BILITOT 0.6  PROT 8.7*  ALBUMIN 3.9   No results for input(s): LIPASE, AMYLASE in the last 168 hours. No results for input(s): AMMONIA in the last 168 hours. Coagulation Profile: No results for input(s): INR, PROTIME in the last 168 hours. Cardiac Enzymes: No results for input(s): CKTOTAL, CKMB, CKMBINDEX, TROPONINI in the last 168 hours. BNP (last 3 results) No results for input(s): PROBNP in the last 8760 hours. HbA1C: No results for input(s): HGBA1C in the last 72 hours. CBG: No results for input(s): GLUCAP in the last 168 hours. Lipid Profile: No results for input(s): CHOL, HDL, LDLCALC, TRIG, CHOLHDL, LDLDIRECT in the last 72 hours. Thyroid Function Tests: No results for input(s): TSH,  T4TOTAL, FREET4, T3FREE, THYROIDAB in the last 72 hours. Anemia Panel: No results for input(s): VITAMINB12, FOLATE, FERRITIN, TIBC, IRON, RETICCTPCT in the last 72 hours. Urine analysis:    Component Value Date/Time   COLORURINE YELLOW 08/03/2016 1505   APPEARANCEUR CLEAR 08/03/2016 1505   LABSPEC 1.008 08/03/2016 1505   PHURINE 7.5 08/03/2016 1505   GLUCOSEU NEGATIVE 08/03/2016 1505   HGBUR NEGATIVE 08/03/2016 1505   BILIRUBINUR NEGATIVE 08/03/2016 1505   KETONESUR NEGATIVE 08/03/2016 1505   PROTEINUR NEGATIVE 08/03/2016 1505   UROBILINOGEN 0.2 06/15/2014 1450   NITRITE NEGATIVE 08/03/2016 1505   LEUKOCYTESUR NEGATIVE 08/03/2016 1505    Creatinine Clearance: Estimated Creatinine Clearance: 35.8 mL/min (by C-G formula based on SCr of 0.72 mg/dL).  Sepsis Labs: _0 (procalcitonin:4,lacticidven:4) )No results found for this or any previous visit (from the past 240 hour(s)).   Radiological Exams on Admission: Dg Chest 2 View  Result Date: 08/29/2017 CLINICAL DATA:  Shortness of breath for the past few days. Chest pain. EXAM: CHEST - 2 VIEW COMPARISON:  Chest x-ray dated March 06, 2017. FINDINGS: Stable mild cardiomegaly status post CABG. Normal pulmonary vascularity. Atherosclerotic calcification of the aortic arch. No focal consolidation, pleural effusion, or pneumothorax. No acute osseous abnormality. Unchanged left Bochdalek hernia. IMPRESSION: No active cardiopulmonary disease. Electronically Signed   By: Titus Dubin M.D.   On: 08/29/2017 08:11   Ct Angio Chest Pe W/cm &/or Wo Cm  Result Date: 08/29/2017 CLINICAL DATA:  Shortness of breath. EXAM: CT ANGIOGRAPHY CHEST WITH CONTRAST TECHNIQUE: Multidetector CT imaging of the chest was performed using the standard protocol during bolus administration of intravenous contrast. Multiplanar CT image reconstructions and MIPs were obtained to evaluate the vascular anatomy. CONTRAST:  44m ISOVUE-370 IOPAMIDOL (ISOVUE-370)  INJECTION 76% COMPARISON:  Chest x-ray August 29, 2017 and chest CT August 03, 2016 FINDINGS: Cardiovascular: Atherosclerotic change in the nonaneurysmal aorta. Coronary artery calcifications. The heart is unchanged. No pulmonary emboli. Mediastinum/Nodes: Nodularity in the left thyroid is stable measuring 15 mm. The esophagus is normal. No effusions. No adenopathy. Lungs/Pleura: Central airways are normal. No pneumothorax. There is a nodular density along the right hemidiaphragm Doan best seen on coronal image 59 measuring 10 x 5 mm today versus 11 by 5 mm in July of 2017, without significant change. Numerous other bilateral pulmonary nodules are identified without significant interval change given difference in slice selection and thinner slices on today's study versus previous studies. Emphysematous changes, most focal in the medial right lung base. No masses. Bibasilar atelectasis. No suspicious infiltrates to suggest pneumonia. Upper Abdomen: No acute abnormality. Musculoskeletal: No chest wall abnormality. No acute or significant osseous findings. Review of the MIP images confirms the above findings. IMPRESSION: 1. No pulmonary emboli. 2. Stable bilateral pulmonary nodules, likely benign given stability. These nodules have been  stable since February 2018 and basilar nodules have been stable since 2017. 3. Atherosclerotic changes in the nonaneurysmal aorta. Coronary artery calcifications. 4. Stable 15 mm nodule in the left thyroid lobe. An ultrasound could better evaluate if clinically warranted. 5. No other acute abnormalities. Aortic Atherosclerosis (ICD10-I70.0) and Emphysema (ICD10-J43.9). Electronically Signed   By: Dorise Bullion III M.D   On: 08/29/2017 12:01    EKG: Sinus rhythm Abnormal R-wave progression, early transition  Assessment/Plan Principal Problem:   Chest pain Active Problems:   Hyperlipidemia   Hypertension   Depression   Gastroesophageal reflux disease without esophagitis    Asthma in adult without complication   Chronic coronary artery disease   Anxiety   Left thyroid nodule   #1. Chest pain/Shortness of breath. Atypical. Heart score 4.History CADstatus post CABG, hypertension, hyperlipidemia. Initial troponin negative. EKG without acute changes. Chest x-ray no acute abnormalities. CT angio chest negative for PE Patient is pain-free at the time of admission. She had 5 nitros with only minimal impact on chest pain. She states pain stopped when she ate a [place] sandwich. Etiology consulted by ED who opined admit patient for rule out and if she ruled out have a follow-up outpatient -Admit to telemetry -Cycle cardiac enzymes -Serial EKG -Continue aspirin and statin -continuw plavix -GI cocktail -Outpatient follow-up if she rules out -Hopefully home tomorrow  #2. Hypertension. Blood pressure high end of normal in the emergency department. Ome medications include coreg, a Apresoline, imdur -continue home meds -Monitor  #3.thyroid nodule.TSH last year 4.0 -obtain TSH -Outpatient follow-up  #4. Anxiety. His with a history of generalized anxiety disorder. She reports she takes 0.5 mg Ativan each night at bedtime. She also inquired about the face he recently been diagnosed with mesenteric ischemia and wondered if this could be contributing. Chart review indicates he met with cardiology last month to discuss same issue. Dr. Gwenlyn Found noted that patient had a mesenteric arteriogram November 2018 that was entirely normal except for 60% left renal artery stenosis. 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 He opined no further vascular workup necessary -continue ativan  #5. Asthma. Stable at baseline    DVT prophylaxis: plavix  Code Status: full  Family Communication: son at bedside    Disposition Plan: back to facility  Consults called: [facility]  Admission status: obs    Radene Gunning MD [facility]  If 7PM-7AM, please contact night-coverage [email or web address] Password [facility]  08/29/2017, 3:57 PM

## 2017-08-29 NOTE — ED Notes (Signed)
Patient transported to CT 

## 2017-08-29 NOTE — ED Notes (Signed)
Pt given a turkey sandwich

## 2017-08-29 NOTE — ED Provider Notes (Signed)
[facility] Provider Note   CSN: [id number] Arrival date & time: 08/29/17  7782     History   Chief Complaint Chief Complaint  Patient presents with  . Chest Pain    HPI [name] is a 84 y.o. female.  The history is provided by the patient. No language interpreter was used.  Chest Pain      [name] is a 84 y.o. female who presents to the Emergency Department complaining of chest pain, sob. She presents via EMS for evaluation of chest pain and shortness of breath. Symptoms began suddenly and awoke her from sleep at 530 this morning. She reports sharp, central chest pain with associated shortness of breath. She thinks this is related to her elevated blood pressure. She had a similar episode two days ago but symptoms improved quickly and she did not seek treatment at that time. She took 324 mg aspirin as well as three Nitro with minimal improvement in her symptoms. She received two additional nitroglycerin by EMS and now her pain has significantly improved. She denies any fevers, cough, nausea, vomiting. She has chronic left lower extremity edema, unchanged from her baseline. She also took .5 mg Ativan prior to ED arrival.  Past Medical History:  Diagnosis Date  . Chondrocostal junction syndrome   . Chronic fatigue   . Dementia    without Behavioral Disturbance  . GERD (gastroesophageal reflux disease)   . Hx of CABG   . Hypercholesteremia   . Hyperlipidemia    GOAL LDL <70  . Hypertension   . IBS (irritable bowel syndrome)   . Incontinent of urine   . Major depressive disorder   . Mild chronic anemia   . Mixed incontinence   . Multiple myeloma (HCC)    not having achieved remission  . NSTEMI (non-ST elevated myocardial infarction) ([place]) 01/31/2017  . Osteopenia   . Ovarian cyst, bilateral    Rt complex  . Overactive bladder   . Panic disorder   . Radiculopathy   . Raynaud's syndrome   . Raynauds syndrome   . Restless legs  syndrome   . Spinal stenosis of lumbar region   . Unsteadiness on feet     Patient Active Problem List   Diagnosis Date Noted  . Left thyroid nodule 08/29/2017  . Abdominal pain 07/31/2017  . Unsteadiness on feet   . Unstable angina pectoris ([place])   . Tinea pedis   . Spinal stenosis of lumbar region   . Somnolence   . Seasonal allergic rhinitis   . Restless legs syndrome   . Raynaud's syndrome   . Raynauds syndrome   . Radiculopathy   . Panic disorder   . Ovarian cyst, bilateral   . Osteopenia   . Myocardial infarction ([place])   . Muscle weakness (generalized)   . Mixed incontinence   . Mild chronic anemia   . Major depressive disorder   . Long term (current) use of aspirin   . Intermediate coronary syndrome ([place])   . Incontinent of urine   . IBS (irritable bowel syndrome)   . Hypercholesteremia   . Hx of CABG   . GERD (gastroesophageal reflux disease)   . Dementia   . Chest pain   . Atherosclerotic heart disease of native coronary artery without angina pectoris   . Arthritis   . Anxiety   . Allergic rhinitis   . Abnormal weight loss   . NSTEMI (non-ST elevated myocardial infarction) ([place])  02/02/2017  . SOB (shortness of breath) 09/02/2016  . Dizziness 09/02/2016  . Non-ST elevation (NSTEMI) myocardial infarction ([place]) 11/26/2015  . Polymyalgia rheumatica ([place]) 11/26/2015  . Hypokalemia 11/26/2015  . Mild persistent asthma 11/12/2015  . Coronary artery disease involving autologous artery coronary bypass graft with angina pectoris with documented spasm ([place]) 11/12/2015  . Multiple myeloma in remission ([place]) 11/12/2015  . Current use of beta blocker 11/12/2015  . Gastroesophageal reflux disease without esophagitis 11/12/2015  . Other allergic rhinitis 11/12/2015  . Weight loss, non-intentional 10/03/2015  . Asthma 09/24/2015  . Asthma in adult without complication 20/94/7096  . Seasonal allergic rhinitis due to pollen 09/24/2015  . Cervical radiculopathy  07/25/2015  . Chronic coronary artery disease 07/25/2015  . Coronary artery disease 07/25/2015  . Disturbance in sleep behavior 07/25/2015  . Elevated CK 07/25/2015  . Elevated creatine kinase level 07/25/2015  . Generalized osteoarthritis of hand 07/25/2015  . Other long term (current) drug therapy 07/25/2015  . Overactive bladder 07/25/2015  . Restless leg 07/25/2015  . Restless leg syndrome 07/25/2015  . Sleep disturbances 07/25/2015  . Idiopathic peripheral neuropathy 07/04/2015  . Lumbar radiculopathy, chronic 07/04/2015  . 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 Risk for falls 07/04/2015  . Sleep disturbance 07/04/2015  . Chest pain with moderate risk for cardiac etiology 06/13/2015  . Right sciatic nerve pain 10/19/2014  . Abnormal thyroid blood test 08/25/2014  . Chronic fatigue 08/09/2014  . Multiple myeloma ([place]) 07/21/2013  . Depression 01/14/2013  . Generalized anxiety disorder 01/14/2013  . Gastroesophageal reflux disease 01/13/2013  . Urine test positive for microalbuminuria 12/08/2011  . Cyst of ovary 12/17/2010  . KNEE PAIN, BILATERAL 08/15/2010  . Abnormal gait 08/15/2010  . Atherosclerosis of coronary artery bypass graft 10/11/2009  . CHEST PAIN, NON-CARDIAC 10/11/2009  . HIP PAIN, RIGHT 08/13/2009  . GREATER TROCHANTERIC BURSITIS 08/13/2009  . LUMBOSACRAL SPONDYLOSIS WITHOUT MYELOPATHY 11/10/2008  . Osteoarthritis of lumbosacral spine without myelopathy 11/10/2008  . FOOT PAIN, BILATERAL 06/30/2008  . Hyperlipidemia 06/22/2008  . Hypertension 06/22/2008  . Raynaud's disease 06/22/2008  . BURSITIS, LEFT SHOULDER 06/22/2008    Past Surgical History:  Procedure Laterality Date  . BUNIONECTOMY WITH HAMMERTOE RECONSTRUCTION Right 08/04/2012   Procedure:  HAMMERTOE RECONSTRUCTION;  Surgeon: Colin Rhein, MD;  Location: [facility];  Service: Orthopedics;  Laterality: Right;  HAMMER TOE RECONSTRUCTION PROBABLE FLEXOR DIGITORUM LONGUS TO PROXIMAL PHALANX  TRANSFER LATERALIZED   . CAPSULOTOMY Right 08/04/2012   Procedure: CAPSULOTOMY;  Surgeon: Colin Rhein, MD;  Location: [facility];  Service: Orthopedics;  Laterality: Right;  RIGHT 2ND TOE METATARSOPHALANGEAL JOINT DORSAL CAPSULOTOMY   . CATARACT EXTRACTION, BILATERAL    . COLONOSCOPY    . CORONARY ANGIOPLASTY WITH STENT PLACEMENT  2014   2 stents  . CORONARY ARTERY BYPASS GRAFT  2007   LIMA to LAD, SVG to RCA  non-ST segment MI 08/2009 (Dr. Tamala Julian)  . CORONARY STENT INTERVENTION N/A 02/02/2017   Procedure: CORONARY STENT INTERVENTION;  Surgeon: Belva Crome, MD;  Location: [facility];  Service: Cardiovascular;  Laterality: N/A;  Prox CFX  . DILATION AND CURETTAGE OF UTERUS    . EYE SURGERY     both cataracts  . LEFT HEART CATH AND CORS/GRAFTS ANGIOGRAPHY N/A 02/02/2017   Procedure: LEFT HEART CATH AND CORS/GRAFTS ANGIOGRAPHY;  Surgeon: Belva Crome, MD;  Location: [facility];  Service: Cardiovascular;  Laterality: N/A;  . LEFT HEART CATHETERIZATION WITH CORONARY/GRAFT ANGIOGRAM  N/A 07/09/2012   Procedure: LEFT HEART CATHETERIZATION WITH Beatrix Fetters;  Surgeon: Sinclair Grooms, MD;  Location: [facility];  Service: Cardiovascular;  Laterality: N/A;  . TONSILLECTOMY AND ADENOIDECTOMY    . UPPER GASTROINTESTINAL ENDOSCOPY    . VISCERAL ANGIOGRAPHY N/A 04/15/2017   Procedure: VISCERAL ANGIOGRAPHY;  Surgeon: Waynetta Sandy, MD;  Location: [facility];  Service: Cardiovascular;  Laterality: N/A;     OB History   None      Home Medications    Prior to Admission medications   Medication Sig Start Date End Date Taking? Authorizing Provider  acetaminophen (TYLENOL) 650 MG CR tablet Take 1,300 mg by mouth 2 (two) times daily.   Yes [provider]  aspirin EC 81 MG tablet Take 1 tablet (81 mg total) by mouth daily. 01/21/16  Yes Minus Breeding, MD  Calcium Carb-Cholecalciferol (CALCIUM 500 + D3) 500-600 MG-UNIT TABS Take  1 tablet by mouth daily.   Yes [provider]  carvedilol (COREG) 3.125 MG tablet Take 1 tablet (3.125 mg total) by mouth 2 (two) times daily with a meal. 02/11/17  Yes Barrett, Evelene Croon, PA-C  clopidogrel (PLAVIX) 75 MG tablet Take last dose of Brilinta 04/01/17 in evening. Take 4 tablets of Clopidogrel on 04/02/17 then take 1 tablet by mouth daily thereafter. Patient taking differently: Take 75 mg by mouth daily.  04/01/17  Yes Minus Breeding, MD  docusate sodium (COLACE) 100 MG capsule Take 1 capsule (100 mg total) by mouth daily as needed. Patient taking differently: Take 100 mg by mouth See admin instructions. Take 1 capsule (150m) by mouth daily and 1 capsule (10104m daily as needed. 02/03/17 02/03/18 Yes Simmons, Brittainy M, PA-C  hydrALAZINE (APRESOLINE) 10 MG tablet Take 10 mg by mouth every 8 (eight) hours.    Yes [provider]  hydrALAZINE (APRESOLINE) 25 MG tablet Take 25 mg by mouth 2 (two) times daily.   Yes [provider]  iron polysaccharides (NU-IRON) 150 MG capsule Take 150 mg by mouth 2 (two) times daily.    Yes [provider]  isosorbide mononitrate (IMDUR) 60 MG 24 hr tablet Take 60 mg by mouth daily.   Yes [provider]  levocetirizine (XYZAL) 5 MG tablet Take 1 tablet (5 mg total) by mouth every evening. Patient taking differently: Take 5 mg by mouth at bedtime.  05/13/17  Yes Bardelas, Jose A, MD  LORazepam (ATIVAN) 0.5 MG tablet Take 0.25-0.5 mg by mouth See admin instructions. Take 0.25 mg in the morning and 0.56m13mt bedtime.  May take an additional 0.5 mg twice daily as needed for anxiety 01/09/17  Yes [provider]  Melatonin 5 MG TABS Take 5 mg by mouth at bedtime.   Yes [provider]  memantine (NAMENDA) 10 MG tablet Take 10 mg by mouth 2 (two) times daily.    Yes [provider]  mirabegron ER (MYRBETRIQ) 25 MG TB24 tablet Take 25 mg by mouth every morning.    Yes [provider]    montelukast (SINGULAIR) 10 MG tablet Take 10 mg by mouth at bedtime.    Yes [provider]  Multiple Vitamins-Minerals (CENTRUM SILVER 50+WOMEN) TABS Take 1 tablet by mouth daily.   Yes [provider]  nitroGLYCERIN (NITROSTAT) 0.4 MG SL tablet Place 1 tablet (0.4 mg total) under the tongue every 5 (five) minutes as needed for chest pain. Reported on 11/12/2015 02/03/17  Yes SimLyda Jester PA-C  omeprazole (PRILOSEC)  20 MG capsule Take 20 mg by mouth 2 (two) times daily before a meal.   Yes [provider]  polyethylene glycol (MIRALAX / GLYCOLAX) packet Take 17 g by mouth daily.    Yes [provider]  PREMARIN vaginal cream Place 1 Applicatorful vaginally 2 (two) times a week.  05/05/17  Yes [provider]  Probiotic Product (VSL#3 PO) Take 1 packet by mouth every morning.   Yes [provider]  sertraline (ZOLOFT) 25 MG tablet Take 25-50 mg by mouth See admin instructions. Takes 36m po AM and 526mpo PM.    Yes [provider]    Family History Family History  Problem Relation Age of Onset  . Heart attack Mother   . CAD Mother   . Obesity Sister   . Cancer Paternal Aunt        breast  . Asthma Neg Hx   . Eczema Neg Hx   . Urticaria Neg Hx   . Allergic rhinitis Neg Hx   . Angioedema Neg Hx   . Atopy Neg Hx   . Immunodeficiency Neg Hx     Social History Social History   Tobacco Use  . Smoking status: Never Smoker  . Smokeless tobacco: Never Used  Substance Use Topics  . Alcohol use: Yes    Comment: occasional  . Drug use: No     Allergies   Butazolidin [phenylbutazone]; Cephalosporins; Codeine; Levaquin [levofloxacin]; Macrodantin; Morphine and related; Trimethoprim; Buprenorphine; Cilostazol; Dexilant [dexlansoprazole]; Diclofenac; Erythromycin; Morphine; Nitrofurantoin; Simvastatin; Sulfa antibiotics; Vesicare [solifenacin]; Clindamycin/lincomycin; Food; Ibuprofen; Lincomycin hcl; Penicillins; Toviaz  [fesoterodine fumarate er]; and Voltaren [diclofenac sodium]   Review of Systems Review of Systems  Cardiovascular: Positive for chest pain.  All other systems reviewed and are negative.    Physical Exam Updated Vital Signs BP (!) 143/68   Pulse 62   Temp 98.2 F (36.8 C) (Oral)   Resp 14   Ht '5\' 3"'  (1.6 m)   Wt 42.6 kg (94 lb)   SpO2 97%   BMI 16.65 kg/m   Physical Exam  Constitutional: She is oriented to person, place, and time. She appears well-developed and well-nourished.  HENT:  Head: Normocephalic and atraumatic.  Cardiovascular: Normal rate and regular rhythm.  No murmur heard. Pulmonary/Chest: Effort normal and breath sounds normal. No respiratory distress.  Abdominal: Soft. There is no tenderness. There is no rebound and no guarding.  Musculoskeletal: She exhibits no edema or tenderness.  Neurological: She is alert and oriented to person, place, and time.  Skin: Skin is warm and dry.  Psychiatric: She has a normal mood and affect. Her behavior is normal.  Nursing note and vitals reviewed.    ED Treatments / Results  Labs (all labs ordered are listed, but only abnormal results are displayed) Labs Reviewed  COMPREHENSIVE METABOLIC PANEL - Abnormal; Notable for the following components:      Result Value   Sodium 132 (*)    Potassium 3.4 (*)    Chloride 95 (*)    Total Protein 8.7 (*)    All other components within normal limits  CBC WITH DIFFERENTIAL/PLATELET - Abnormal; Notable for the following components:   WBC 2.3 (*)    RBC 3.34 (*)    Hemoglobin 10.9 (*)    HCT 32.6 (*)    Neutro Abs 1.0 (*)    All other components within normal limits  D-DIMER, QUANTITATIVE (NOT AT [facility]- Abnormal; Notable for the following components:   D-Dimer, Quant  0.82 (*)    All other components within normal limits  TROPONIN I  TROPONIN I  TROPONIN I  TSH  I-STAT TROPONIN, ED  I-STAT TROPONIN, ED    EKG EKG Interpretation  Date/Time:  Saturday August 29 2017  07:07:50 EDT Ventricular Rate:  78 PR Interval:  190 QRS Duration: 86 QT Interval:  374 QTC Calculation: 426 R Axis:   10 Text Interpretation:  Normal sinus rhythm Normal ECG Confirmed by Quintella Reichert [phone]) on 08/29/2017 7:11:02 AM Also confirmed by Quintella Reichert [phone]), editor Philomena Doheny [phone])  on 08/29/2017 9:11:37 AM   Radiology Dg Chest 2 View  Result Date: 08/29/2017 CLINICAL DATA:  Shortness of breath for the past few days. Chest pain. EXAM: CHEST - 2 VIEW COMPARISON:  Chest x-ray dated March 06, 2017. FINDINGS: Stable mild cardiomegaly status post CABG. Normal pulmonary vascularity. Atherosclerotic calcification of the aortic arch. No focal consolidation, pleural effusion, or pneumothorax. No acute osseous abnormality. Unchanged left Bochdalek hernia. IMPRESSION: No active cardiopulmonary disease. Electronically Signed   By: Titus Dubin M.D.   On: 08/29/2017 08:11   Ct Angio Chest Pe W/cm &/or Wo Cm  Result Date: 08/29/2017 CLINICAL DATA:  Shortness of breath. EXAM: CT ANGIOGRAPHY CHEST WITH CONTRAST TECHNIQUE: Multidetector CT imaging of the chest was performed using the standard protocol during bolus administration of intravenous contrast. Multiplanar CT image reconstructions and MIPs were obtained to evaluate the vascular anatomy. CONTRAST:  54m ISOVUE-370 IOPAMIDOL (ISOVUE-370) INJECTION 76% COMPARISON:  Chest x-ray August 29, 2017 and chest CT August 03, 2016 FINDINGS: Cardiovascular: Atherosclerotic change in the nonaneurysmal aorta. Coronary artery calcifications. The heart is unchanged. No pulmonary emboli. Mediastinum/Nodes: Nodularity in the left thyroid is stable measuring 15 mm. The esophagus is normal. No effusions. No adenopathy. Lungs/Pleura: Central airways are normal. No pneumothorax. There is a nodular density along the right hemidiaphragm Doan best seen on coronal image 59 measuring 10 x 5 mm today versus 11 by 5 mm in July of 2017, without significant  change. Numerous other bilateral pulmonary nodules are identified without significant interval change given difference in slice selection and thinner slices on today's study versus previous studies. Emphysematous changes, most focal in the medial right lung base. No masses. Bibasilar atelectasis. No suspicious infiltrates to suggest pneumonia. Upper Abdomen: No acute abnormality. Musculoskeletal: No chest wall abnormality. No acute or significant osseous findings. Review of the MIP images confirms the above findings. IMPRESSION: 1. No pulmonary emboli. 2. Stable bilateral pulmonary nodules, likely benign given stability. These nodules have been stable since February 2018 and basilar nodules have been stable since 2017. 3. Atherosclerotic changes in the nonaneurysmal aorta. Coronary artery calcifications. 4. Stable 15 mm nodule in the left thyroid lobe. An ultrasound could better evaluate if clinically warranted. 5. No other acute abnormalities. Aortic Atherosclerosis (ICD10-I70.0) and Emphysema (ICD10-J43.9). Electronically Signed   By: DDorise BullionIII M.D   On: 08/29/2017 12:01    Procedures Procedures (including critical care time)  Medications Ordered in ED Medications  carvedilol (COREG) tablet 3.125 mg (3.125 mg Oral Given 08/29/17 1431)  hydrALAZINE (APRESOLINE) tablet 25 mg (25 mg Oral Given 08/29/17 1431)  aspirin EC tablet 81 mg (has no administration in time range)  clopidogrel (PLAVIX) tablet 75 mg (has no administration in time range)  docusate sodium (COLACE) capsule 100 mg (has no administration in time range)  iron polysaccharides (NIFEREX) capsule 150 mg (has no administration in time range)  isosorbide mononitrate (IMDUR) 24 hr tablet 60 mg (has no  administration in time range)  acetaminophen (TYLENOL) tablet 650 mg (has no administration in time range)  ondansetron (ZOFRAN) injection 4 mg (has no administration in time range)  morphine 4 MG/ML injection 2 mg (has no  administration in time range)  gi cocktail (Maalox,Lidocaine,Donnatal) (has no administration in time range)  ALPRAZolam (XANAX) tablet 0.25 mg (has no administration in time range)  potassium chloride SA (K-DUR,KLOR-CON) CR tablet 40 mEq (has no administration in time range)  montelukast (SINGULAIR) tablet 10 mg (has no administration in time range)  loratadine (CLARITIN) tablet 10 mg (has no administration in time range)  iopamidol (ISOVUE-370) 76 % injection (50 mLs  Contrast Given 08/29/17 1128)  alum & mag hydroxide-simeth (MAALOX/MYLANTA) 200-200-20 MG/5ML suspension 15 mL (15 mLs Oral Given 08/29/17 1320)     Initial Impression / Assessment and Plan / ED Course  I have reviewed the triage vital signs and the nursing notes.  Pertinent labs & imaging results that were available during my care of the patient were reviewed by me and considered in my medical decision making (see chart for details).     For evaluation of chest pain. Her pain is improving on evaluation in the emergency department. The timer obtained given estrogen use in this is mildly elevated. CTA was obtained that was negative for acute PE. Given her cardiac history plan to admit for observation for ongoing intermittent chest pain. Medicine consulted for admission.  Final Clinical Impressions(s) / ED Diagnoses   Final diagnoses:  None    ED Discharge Orders    None       Quintella Reichert, MD 08/29/17 1719

## 2017-08-30 ENCOUNTER — Observation Stay (HOSPITAL_BASED_OUTPATIENT_CLINIC_OR_DEPARTMENT_OTHER): Payer: PPO

## 2017-08-30 DIAGNOSIS — J4530 Mild persistent asthma, uncomplicated: Secondary | ICD-10-CM | POA: Diagnosis not present

## 2017-08-30 DIAGNOSIS — I083 Combined rheumatic disorders of mitral, aortic and tricuspid valves: Secondary | ICD-10-CM | POA: Diagnosis not present

## 2017-08-30 DIAGNOSIS — R600 Localized edema: Secondary | ICD-10-CM | POA: Diagnosis not present

## 2017-08-30 DIAGNOSIS — I361 Nonrheumatic tricuspid (valve) insufficiency: Secondary | ICD-10-CM

## 2017-08-30 DIAGNOSIS — F419 Anxiety disorder, unspecified: Secondary | ICD-10-CM | POA: Diagnosis not present

## 2017-08-30 DIAGNOSIS — E041 Nontoxic single thyroid nodule: Secondary | ICD-10-CM | POA: Diagnosis not present

## 2017-08-30 DIAGNOSIS — E785 Hyperlipidemia, unspecified: Secondary | ICD-10-CM

## 2017-08-30 DIAGNOSIS — I10 Essential (primary) hypertension: Secondary | ICD-10-CM | POA: Diagnosis not present

## 2017-08-30 DIAGNOSIS — I351 Nonrheumatic aortic (valve) insufficiency: Secondary | ICD-10-CM

## 2017-08-30 DIAGNOSIS — Z951 Presence of aortocoronary bypass graft: Secondary | ICD-10-CM | POA: Diagnosis not present

## 2017-08-30 DIAGNOSIS — F329 Major depressive disorder, single episode, unspecified: Secondary | ICD-10-CM | POA: Diagnosis not present

## 2017-08-30 DIAGNOSIS — R079 Chest pain, unspecified: Secondary | ICD-10-CM | POA: Diagnosis not present

## 2017-08-30 DIAGNOSIS — I700 Atherosclerosis of aorta: Secondary | ICD-10-CM | POA: Diagnosis not present

## 2017-08-30 DIAGNOSIS — R0789 Other chest pain: Secondary | ICD-10-CM | POA: Diagnosis not present

## 2017-08-30 DIAGNOSIS — J439 Emphysema, unspecified: Secondary | ICD-10-CM | POA: Diagnosis not present

## 2017-08-30 DIAGNOSIS — F411 Generalized anxiety disorder: Secondary | ICD-10-CM | POA: Diagnosis not present

## 2017-08-30 DIAGNOSIS — F0390 Unspecified dementia without behavioral disturbance: Secondary | ICD-10-CM | POA: Diagnosis not present

## 2017-08-30 DIAGNOSIS — I252 Old myocardial infarction: Secondary | ICD-10-CM | POA: Diagnosis not present

## 2017-08-30 LAB — CBC WITH DIFFERENTIAL/PLATELET
BASOS ABS: 0 10*3/uL (ref 0.0–0.1)
Basophils Relative: 2 %
EOS ABS: 0.1 10*3/uL (ref 0.0–0.7)
EOS PCT: 2 %
HCT: 31.5 % — ABNORMAL LOW (ref 36.0–46.0)
HEMOGLOBIN: 10.7 g/dL — AB (ref 12.0–15.0)
LYMPHS PCT: 42 %
Lymphs Abs: 1 10*3/uL (ref 0.7–4.0)
MCH: 33.3 pg (ref 26.0–34.0)
MCHC: 34 g/dL (ref 30.0–36.0)
MCV: 98.1 fL (ref 78.0–100.0)
Monocytes Absolute: 0.3 10*3/uL (ref 0.1–1.0)
Monocytes Relative: 10 %
NEUTROS PCT: 44 %
Neutro Abs: 1.1 10*3/uL — ABNORMAL LOW (ref 1.7–7.7)
PLATELETS: 294 10*3/uL (ref 150–400)
RBC: 3.21 MIL/uL — AB (ref 3.87–5.11)
RDW: 14.3 % (ref 11.5–15.5)
WBC: 2.5 10*3/uL — AB (ref 4.0–10.5)

## 2017-08-30 LAB — BASIC METABOLIC PANEL
ANION GAP: 11 (ref 5–15)
BUN: 25 mg/dL — ABNORMAL HIGH (ref 6–20)
CO2: 25 mmol/L (ref 22–32)
Calcium: 9.6 mg/dL (ref 8.9–10.3)
Chloride: 101 mmol/L (ref 101–111)
Creatinine, Ser: 0.89 mg/dL (ref 0.44–1.00)
GFR calc Af Amer: >60 mL/min (ref >60)
GFR, EST NON AFRICAN AMERICAN: 58 mL/min — AB (ref >60)
Glucose, Bld: 103 mg/dL — ABNORMAL HIGH (ref 65–99)
Potassium: 4.2 mmol/L (ref 3.5–5.1)
SODIUM: 137 mmol/L (ref 135–145)

## 2017-08-30 LAB — ECHOCARDIOGRAM COMPLETE
Height: 63 in
WEIGHTICAEL: 1396.8 [oz_av]

## 2017-08-30 LAB — MAGNESIUM: MAGNESIUM: 2.2 mg/dL (ref 1.7–2.4)

## 2017-08-30 MED ORDER — MONTELUKAST SODIUM 10 MG PO TABS: 10.0000 mg | ORAL_TABLET | Freq: Every day | ORAL | Status: DC

## 2017-08-30 MED ORDER — MEMANTINE HCL 10 MG PO TABS
10.0000 mg | ORAL_TABLET | Freq: Two times a day (BID) | ORAL | Status: DC
  Administered 2017-08-30: 10 mg via ORAL
  Filled 2017-08-30: qty 1

## 2017-08-30 MED ORDER — MIRABEGRON ER 25 MG PO TB24
25.0000 mg | ORAL_TABLET | ORAL | Status: DC
  Administered 2017-08-30: 25 mg via ORAL
  Filled 2017-08-30: qty 1

## 2017-08-30 MED ORDER — SERTRALINE HCL 50 MG PO TABS: 50.0000 mg | ORAL_TABLET | Freq: Every day | ORAL | Status: DC

## 2017-08-30 MED ORDER — PANTOPRAZOLE SODIUM 40 MG PO TBEC
40.0000 mg | DELAYED_RELEASE_TABLET | Freq: Two times a day (BID) | ORAL | Status: DC
  Administered 2017-08-30: 40 mg via ORAL
  Filled 2017-08-30: qty 1

## 2017-08-30 MED ORDER — POLYETHYLENE GLYCOL 3350 17 G PO PACK
17.0000 g | PACK | Freq: Every day | ORAL | Status: DC
  Administered 2017-08-30: 17 g via ORAL
  Filled 2017-08-30: qty 1

## 2017-08-30 MED ORDER — FAMOTIDINE 20 MG PO TABS: 40.0000 mg | ORAL_TABLET | Freq: Every day | ORAL | Status: DC

## 2017-08-30 MED ORDER — SERTRALINE HCL 50 MG PO TABS
25.0000 mg | ORAL_TABLET | Freq: Every day | ORAL | Status: DC
  Administered 2017-08-30: 25 mg via ORAL
  Filled 2017-08-30: qty 1

## 2017-08-30 MED ORDER — SERTRALINE HCL 50 MG PO TABS: 25.0000 mg | ORAL_TABLET | ORAL | Status: DC

## 2017-08-30 NOTE — Progress Notes (Signed)
Cardiology Office Note   Date:  09/01/2017   ID:  [name] 1932-06-06, MRN [id number]  PCP:  [place] [facility]  Cardiologist:   Minus Breeding, MD    No chief complaint on file.     History of Present Illness: [name] is a 84 y.o. female who presents of follow up of chest pain.  She was in the hospital earlier this week with chest pain.  She has a long history of pain and labile hypertension.  She has significant intolerances ot medications and she has multiple somatic complaints.     A stress test in [facility] three years ago had no ischemia and an EF of 75%.  In 2018 she had  NSTEMI and needed a PCI of a 90% circ stenosis.  She had evaluation of abdominal pain with an angiogram by Dr. Donzetta Matters.  She was not found to have any SM or celiac artery stenosis.   She has seen neurology for increased memory problems.   I reviewed the hospital records from this week.  At the [facility] home her blood pressure was recorded at 220/102.  This was at 5:30 in the morning.  She was given sublingual nitroglycerin x3 and transiently came down but went back up.  She was anxious and having shortness of breath at that point.  She went to the emergency room.  In the hospital her blood pressure systolic peak was 237 and the lowest was 95.  Her meds were not changed.  However, it was felt that she was quite anxious and this was discussed.  I reviewed records extensive blood pressure readings from her assisted living facility her blood pressure does fluctuate typically from the 170s to the 628B systolic.  Her husband who is with her thinks she gets panic.  She has lots of abdominal complaints with lots of abdominal pain that might trigger her symptoms.  She might get some shortness of breath and chest discomfort when her blood pressure is elevated.  She used to have sublingual nitroglycerin at her bedside that she could use.  We also talked about the timing of her hydralazine which is not  given every 12 hours but is really about 8 or 9 hours in between doses.   Past Medical History:  Diagnosis Date  . Chondrocostal junction syndrome   . Chronic fatigue   . Dementia    without Behavioral Disturbance  . GERD (gastroesophageal reflux disease)   . Hx of CABG   . Hypercholesteremia   . Hyperlipidemia    GOAL LDL <70  . Hypertension   . IBS (irritable bowel syndrome)   . Incontinent of urine   . Major depressive disorder   . Mild chronic anemia   . Mixed incontinence   . Multiple myeloma (HCC)    not having achieved remission  . NSTEMI (non-ST elevated myocardial infarction) ([place]) 01/31/2017  . Osteopenia   . Ovarian cyst, bilateral    Rt complex  . Overactive bladder   . Panic disorder   . Radiculopathy   . Raynaud's syndrome   . Raynauds syndrome   . Restless legs syndrome   . Spinal stenosis of lumbar region   . Unsteadiness on feet     Past Surgical History:  Procedure Laterality Date  . BUNIONECTOMY WITH HAMMERTOE RECONSTRUCTION Right 08/04/2012   Procedure:  HAMMERTOE RECONSTRUCTION;  Surgeon: Colin Rhein, MD;  Location: [facility];  Service: Orthopedics;  Laterality: Right;  HAMMER TOE RECONSTRUCTION PROBABLE FLEXOR DIGITORUM LONGUS TO PROXIMAL PHALANX TRANSFER LATERALIZED   . CAPSULOTOMY Right 08/04/2012   Procedure: CAPSULOTOMY;  Surgeon: Colin Rhein, MD;  Location: [facility];  Service: Orthopedics;  Laterality: Right;  RIGHT 2ND TOE METATARSOPHALANGEAL JOINT DORSAL CAPSULOTOMY   . CATARACT EXTRACTION, BILATERAL    . COLONOSCOPY    . CORONARY ANGIOPLASTY WITH STENT PLACEMENT  2014   2 stents  . CORONARY ARTERY BYPASS GRAFT  2007   LIMA to LAD, SVG to RCA  non-ST segment MI 08/2009 (Dr. Tamala Julian)  . CORONARY STENT INTERVENTION N/A 02/02/2017   Procedure: CORONARY STENT INTERVENTION;  Surgeon: Belva Crome, MD;  Location: [facility];  Service: Cardiovascular;  Laterality: N/A;  Prox CFX  . DILATION AND  CURETTAGE OF UTERUS    . EYE SURGERY     both cataracts  . LEFT HEART CATH AND CORS/GRAFTS ANGIOGRAPHY N/A 02/02/2017   Procedure: LEFT HEART CATH AND CORS/GRAFTS ANGIOGRAPHY;  Surgeon: Belva Crome, MD;  Location: [facility];  Service: Cardiovascular;  Laterality: N/A;  . LEFT HEART CATHETERIZATION WITH CORONARY/GRAFT ANGIOGRAM N/A 07/09/2012   Procedure: LEFT HEART CATHETERIZATION WITH Beatrix Fetters;  Surgeon: Sinclair Grooms, MD;  Location: [facility] C[facility];  Service: Cardiovascular;  Laterality: N/A;  . TONSILLECTOMY AND ADENOIDECTOMY    . UPPER GASTROINTESTINAL ENDOSCOPY    . VISCERAL ANGIOGRAPHY N/A 04/15/2017   Procedure: VISCERAL ANGIOGRAPHY;  Surgeon: Waynetta Sandy, MD;  Location: [facility];  Service: Cardiovascular;  Laterality: N/A;     Current Outpatient Medications  Medication Sig Dispense Refill  . acetaminophen (TYLENOL) 650 MG CR tablet Take 1,300 mg by mouth 2 (two) times daily.    Marland Kitchen aspirin EC 81 MG tablet Take 1 tablet (81 mg total) by mouth daily. 90 tablet 3  . Calcium Carb-Cholecalciferol (CALCIUM 500 + D3) 500-600 MG-UNIT TABS Take 1 tablet by mouth daily.    . carvedilol (COREG) 3.125 MG tablet Take 1 tablet (3.125 mg total) by mouth 2 (two) times daily with a meal. 60 tablet 3  . clopidogrel (PLAVIX) 75 MG tablet Take last dose of Brilinta 04/01/17 in evening. Take 4 tablets of Clopidogrel on 04/02/17 then take 1 tablet by mouth daily thereafter. (Patient taking differently: Take 75 mg by mouth daily. ) 94 tablet 1  . docusate sodium (COLACE) 100 MG capsule Take 1 capsule (100 mg total) by mouth daily as needed. (Patient taking differently: Take 100 mg by mouth See admin instructions. Take 1 capsule (176m) by mouth daily and 1 capsule (1031m daily as needed.) 30 capsule 2  . hydrALAZINE (APRESOLINE) 25 MG tablet Take 25 mg by mouth 2 (two) times daily.    . iron polysaccharides (NU-IRON) 150 MG capsule Take 150 mg by mouth 2 (two)  times daily.     . isosorbide mononitrate (IMDUR) 60 MG 24 hr tablet Take 60 mg by mouth daily.    . Marland Kitchenevocetirizine (XYZAL) 5 MG tablet Take 1 tablet (5 mg total) by mouth every evening. (Patient taking differently: Take 5 mg by mouth at bedtime. ) 30 tablet 5  . LORazepam (ATIVAN) 0.5 MG tablet Take 0.25-0.5 mg by mouth See admin instructions. Take 0.25 mg in the morning and 0.52m43mt bedtime.  May take an additional 0.5 mg twice daily as needed for anxiety    . Melatonin 5 MG TABS Take 5 mg by mouth at bedtime.    . memantine (NAMENDA) 10  MG tablet Take 10 mg by mouth 2 (two) times daily.     . mirabegron ER (MYRBETRIQ) 25 MG TB24 tablet Take 25 mg by mouth every morning.     . montelukast (SINGULAIR) 10 MG tablet Take 10 mg by mouth at bedtime.     . Multiple Vitamins-Minerals (CENTRUM SILVER 50+WOMEN) TABS Take 1 tablet by mouth daily.    . nitroGLYCERIN (NITROSTAT) 0.4 MG SL tablet Place 1 tablet (0.4 mg total) under the tongue every 5 (five) minutes as needed for chest pain. Reported on 11/12/2015 25 tablet 2  . omeprazole (PRILOSEC) 20 MG capsule Take 20 mg by mouth 2 (two) times daily before a meal.    . polyethylene glycol (MIRALAX / GLYCOLAX) packet Take 17 g by mouth daily.     Marland Kitchen PREMARIN vaginal cream Place 1 Applicatorful vaginally 2 (two) times a week.     . Probiotic Product (VSL#3 PO) Take 1 packet by mouth every morning.    . sertraline (ZOLOFT) 25 MG tablet Take 25-50 mg by mouth See admin instructions. Takes 25m po AM and 535mpo PM.      No current facility-administered medications for this visit.      ROS:  Please see the history of present illness.   Otherwise, review of systems are positive for none.   All other systems are reviewed and negative.    PHYSICAL EXAM: VS:  BP (!) 153/87   Pulse 66   Ht 5' 3" (1.6 m)   Wt 95 lb 3.2 oz (43.2 kg)   BMI 16.86 kg/m  , BMI Body mass index is 16.86 kg/m.  GENERAL:  Well appearing NECK:  No jugular venous distention, waveform  within normal limits, carotid upstroke brisk and symmetric, no bruits, no thyromegaly LUNGS:  Clear to auscultation bilaterally CHEST:  Unremarkable HEART:  PMI not displaced or sustained,S1 and S2 within normal limits, no S3, no S4, no clicks, no rubs, no murmurs ABD:  Flat, positive bowel sounds normal in frequency in pitch, no bruits, no rebound, no guarding, no midline pulsatile mass, no hepatomegaly, no splenomegaly EXT:  2 plus pulses throughout, no edema, no cyanosis no clubbing   EKG:  EKG not ordered today.    Recent Labs: 02/17/2017: B Natriuretic Peptide 62.4 08/29/2017: ALT 15; TSH 3.070 08/30/2017: BUN 25; Creatinine, Ser 0.89; Hemoglobin 10.7; Magnesium 2.2; Platelets 294; Potassium 4.2; Sodium 137    Lipid Panel    Component Value Date/Time   CHOL 152 02/01/2017 0919   TRIG 41 02/01/2017 0919   HDL 75 02/01/2017 0919   CHOLHDL 2.0 02/01/2017 0919   VLDL 8 02/01/2017 0919   LDLCALC 69 02/01/2017 0919      Wt Readings from Last 3 Encounters:  09/01/17 95 lb 3.2 oz (43.2 kg)  08/30/17 87 lb 4.8 oz (39.6 kg)  07/31/17 95 lb 6.4 oz (43.3 kg)      Other studies Reviewed: Additional studies/ records that were reviewed today include:   Hospital records    Review of the above records demonstrates:     ASSESSMENT AND PLAN:   Atherosclerosis of coronary artery bypass graft of native heart without angina pectoris I do not think she is having active angina.  No change in therapy.  Essential hypertension I reviewed her hospital records and pressure readings from the nursing home extensively.  I am going to give her sublingual nitroglycerin to keep at her bedside should she have any hypertensive urgency.  She needs to have her  panic and anxiety addressed.  Going to try to get the hydralazine 12 hours apart with the last dose at 10 PM which is later than she is been getting it.  I might eventually have to change to 3 times daily hydralazine but she does get some low  readings in the afternoon she feels lightheaded with this.   Hyperlipidemia I think she is tolerating lipitor.    Abdominal pain She has asked for second opinion from a GI doctor on the question of intestinal ischemia and I will try to arrange this.   Current medicines are reviewed at length with the patient today.  The patient does not have concerns regarding medicines.  The following changes have been made:  As above  Labs/ tests ordered today include:  No orders of the defined types were placed in this encounter.    Disposition:   FU with APP in 1 month.      Signed, Minus Breeding, MD  09/01/2017 5:27 PM    [facility]

## 2017-08-30 NOTE — Progress Notes (Signed)
  Echocardiogram 2D Echocardiogram has been performed.  Jennette Dubin 08/30/2017, 2:00 PM

## 2017-08-30 NOTE — Progress Notes (Signed)
Clinical Social Worker was verbally consulted to assist patient in returning back to her ALF ([facility]). CSW faxed over discharge summary to facility and made them aware patient will return today.  CSW met patient and spouse at bedside. Patient stated her husband will be transporting her back to facility.   RN to please call report at [place], MSW,  [place]

## 2017-08-30 NOTE — Discharge Summary (Signed)
Physician Discharge Summary  [name] WFU:[id number] DOB: [birth year] DOA: 08/29/2017  PCP: [place] date: 08/29/2017 Discharge date: 08/30/2017  Time spent: 55 minutes  Recommendations for Outpatient Follow-up:  1. Follow-up with Dr. Percival Spanish, cardiology on 09/01/2017 as previously scheduled.  Patient will benefit from a basic metabolic profile as well as a magnesium level to follow-up on electrolytes and renal function. 2. Follow-up with [place], [place], PCP at facility.  Thyroid nodule on left thyroid lobe will need to be followed up upon with a thyroid ultrasound.   Discharge Diagnoses:  Principal Problem:   Chest pain Active Problems:   Hyperlipidemia   Hypertension   Depression   Gastroesophageal reflux disease without esophagitis   Asthma in adult without complication   Chronic coronary artery disease   Anxiety   Left thyroid nodule   Discharge Condition: Stable and improved  Diet recommendation: Heart healthy  Filed Weights   08/29/17 0715 08/29/17 1700 08/30/17 0540  Weight: 42.6 kg (94 lb) 39.8 kg (87 lb 11.2 oz) 39.6 kg (87 lb 4.8 oz)    History of present illness:  Per Dr. Heide Spark is a nervous 84 y.o. female with medical history significant for CAD, hypertension, hyperlipidemia, generalized anxiety disorder presented to the emergency Department chief complaint dyspnea and chest pain. Initial evaluation was reassuring [facility] were asked to admit.  Information was obtained from the patient. She reported she was in her usual state of health suddenly she was awakened from her sleep the morning of admission, with sudden onset of acute shortness of breath. Associated symptoms included chest pain located in her central chest area described as sharp and constant. She stated that the chest pain wasn't as bad as the shortness of breath. She reported she had similar episode 2 days prior but the symptoms  improved quickly so she did not seek treatment. The morning of admission, she took 324 mg of aspirin as well as 3 nitroglycerin with minimal improvement. She called EMS who provided her with 2 additional nitroglycerin. She reported that the pain did not go completely away until she finished her [place] sandwich that she received in the emergency department. She stated "I was hungry". Denied headache dizziness syncope or near-syncope. She denied abdominal pain nausea vomiting diarrhea constipation melena bright red blood per rectum. She denied any fever chills dysuria hematuria. She denied any cough. She reported she had chronic lower extremity edema that was unchanged. She reported that she receives 0.5 mg of Ativan every night "for sleep" and that she took an additional prior to arriving to the ED    ED Course: in the emergency department she was hypertensive not hypoxic she was nontoxic appearing.     Hospital Course:  #1 chest pain/shortness of breath Patient with both typical and atypical features of her chest pain.  Patient with a heart score for with prior history of coronary artery disease status post CABG, history of hypertension, hyperlipidemia.  Cardiac enzymes were cycled which were negative x3.  EKG obtained with no acute changes noted.  Chest x-ray was negative for any abnormalities.  CT angiogram chest was negative for PE however did show some stable pulmonary nodules which are unchanged and likely benign in nature.  Patient remained chest pain-free throughout the hospitalization.  2D echo was obtained with a EF of 55%, no wall motion abnormalities, mild aortic valvular regurgitation, trivial regurgitation in the mitral valve, mild tricuspid valvular regurgitation, no  pericardial effusion.  Patient was maintained on her home cardiac regimen of Coreg, hydralazine,imdur as well as a PPI.  Patient improved clinically did not have any further chest pain or shortness of breath and patient be  discharged in stable and improved condition.  Patient has an appointment to follow-up with her cardiologist, Dr. Percival Spanish on Tuesday, 09/01/2017 which patient has been advised to keep for hospital follow-up.  Patient was discharged in stable and improved condition.  2.  Hypertension Remained stable.  Patient was maintained on her home regimen of Coreg, hydralazine,imdur.  Outpatient follow-up.  3.  Thyroid nodule Noted on CT angiogram chest.  TSH was obtained which was within normal limits at 3.070.  Outpatient follow-up with PCP.  4.  General anxiety disorder Patient was placed back on home regimen of Ativan as needed.Chart review indicates she met with cardiology last month to discuss same issue. Dr. Gwenlyn Found noted that patient had a mesenteric arteriogram November 2018 that was entirely normal except for 60% left renal artery stenosis. 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 He opined no further vascular workup necessary Patient maintain on home regimen of anxiolytics during the hospitalization.  5.  Asthma Remained stable.    Procedures:  Chest x-ray 08/29/2017    Consultations:  None  Discharge Exam: Vitals:   08/30/17 0906 08/30/17 1100  BP: (!) 152/60 (!) 95/48  Pulse: 70 64  Resp:  18  Temp:  (!) 97.4 F (36.3 C)  SpO2: 99% 99%    General: NAD Cardiovascular: RRR Respiratory: CTAB  Discharge Instructions   Discharge Instructions    Diet - low sodium heart healthy   Complete by:  As directed    Increase activity slowly   Complete by:  As directed      Allergies as of 08/30/2017      Reactions   Butazolidin [phenylbutazone] Swelling, Rash   Cephalosporins Other (See Comments)   unknown   Codeine Nausea Only   Levaquin [levofloxacin] Shortness Of Breath   Macrodantin  Other (See Comments)   Double vision   Morphine And Related Other (See Comments)   hallucinations   Trimethoprim Nausea Only   Buprenorphine    From MAR   Cilostazol Other (See Comments)   Stomach upset   Dexilant [dexlansoprazole] Diarrhea   Diclofenac    Documented on MAR   Erythromycin Nausea And Vomiting   All "mycin" drugs   Morphine    Nitrofurantoin    Double vision   Simvastatin Other (See Comments)   Muscle aches   Sulfa Antibiotics Other (See Comments)   unknown   Vesicare [solifenacin] Other (See Comments)   unknown   Clindamycin/lincomycin Other (See Comments)   Upset stomach   Food Other (See Comments)   No spicy foods or black pepper, raw onions - causes gerd   Ibuprofen Hives, Rash   Lincomycin Hcl Other (See Comments)   Upset stomach   Penicillins Rash   Has patient had a PCN reaction causing immediate rash, facial/tongue/throat swelling, SOB or lightheadedness with hypotension: Yes Has patient had a PCN reaction causing severe rash involving mucus membranes or skin necrosis: No Has patient had a PCN reaction that required hospitalization No Has patient had a PCN reaction occurring within the last 10 years: No If all of the above answers are "NO", then  may proceed with Cephalosporin use.   Toviaz [fesoterodine Fumarate Er] Other (See Comments)   unknown   Voltaren [diclofenac Sodium] Rash      Medication List    TAKE these medications   acetaminophen 650 MG CR tablet Commonly known as:  TYLENOL Take 1,300 mg by mouth 2 (two) times daily.   aspirin EC 81 MG tablet Take 1 tablet (81 mg total) by mouth daily.   CALCIUM 500 + D3 500-600 MG-UNIT Tabs Generic drug:  Calcium Carb-Cholecalciferol Take 1 tablet by mouth daily.   carvedilol 3.125 MG tablet Commonly known as:  COREG Take 1 tablet (3.125 mg total) by mouth 2 (two) times daily with a meal.   CENTRUM SILVER 50+WOMEN Tabs Take 1 tablet by mouth daily.   clopidogrel 75 MG tablet Commonly  known as:  PLAVIX Take last dose of Brilinta 04/01/17 in evening. Take 4 tablets of Clopidogrel on 04/02/17 then take 1 tablet by mouth daily thereafter. What changed:    how much to take  how to take this  when to take this  additional instructions   docusate sodium 100 MG capsule Commonly known as:  COLACE Take 1 capsule (100 mg total) by mouth daily as needed. What changed:    when to take this  additional instructions   hydrALAZINE 25 MG tablet Commonly known as:  APRESOLINE Take 25 mg by mouth 2 (two) times daily. What changed:  Another medication with the same name was removed. Continue taking this medication, and follow the directions you see here.   isosorbide mononitrate 60 MG 24 hr tablet Commonly known as:  IMDUR Take 60 mg by mouth daily.   levocetirizine 5 MG tablet Commonly known as:  XYZAL Take 1 tablet (5 mg total) by mouth every evening. What changed:  when to take this   LORazepam 0.5 MG tablet Commonly known as:  ATIVAN Take 0.25-0.5 mg by mouth See admin instructions. Take 0.25 mg in the morning and 0.54m at bedtime.  May take an additional 0.5 mg twice daily as needed for anxiety   Melatonin 5 MG Tabs Take 5 mg by mouth at bedtime.   memantine 10 MG tablet Commonly known as:  NAMENDA Take 10 mg by mouth 2 (two) times daily.   montelukast 10 MG tablet Commonly known as:  SINGULAIR Take 10 mg by mouth at bedtime.   MYRBETRIQ 25 MG Tb24 tablet Generic drug:  mirabegron ER Take 25 mg by mouth every morning.   nitroGLYCERIN 0.4 MG SL tablet Commonly known as:  NITROSTAT Place 1 tablet (0.4 mg total) under the tongue every 5 (five) minutes as needed for chest pain. Reported on 11/12/2015   NU-IRON 150 MG capsule Generic drug:  iron polysaccharides Take 150 mg by mouth 2 (two) times daily.   omeprazole 20 MG capsule Commonly known as:  PRILOSEC Take 20 mg by mouth 2 (two) times daily before a meal.   polyethylene glycol packet Commonly  known as:  MIRALAX / GLYCOLAX Take 17 g by mouth daily.   PREMARIN vaginal cream Generic drug:  conjugated estrogens Place 1 Applicatorful vaginally 2 (two) times a week.   sertraline 25 MG tablet Commonly known as:  ZOLOFT Take 25-50 mg by mouth See admin instructions. Takes 237mpo AM and 5072mo PM.   VSL#3 PO Take 1 packet by mouth every morning.      Allergies  Allergen Reactions  . Butazolidin [Phenylbutazone] Swelling and Rash  . Cephalosporins Other (See Comments)  unknown  . Codeine Nausea Only  . Levaquin [Levofloxacin] Shortness Of Breath  . Macrodantin Other (See Comments)    Double vision  . Morphine And Related Other (See Comments)    hallucinations  . Trimethoprim Nausea Only  . Buprenorphine     From MAR  . Cilostazol Other (See Comments)    Stomach upset  . Dexilant [Dexlansoprazole] Diarrhea  . Diclofenac     Documented on MAR  . Erythromycin Nausea And Vomiting    All "mycin" drugs  . Morphine   . Nitrofurantoin     Double vision  . Simvastatin Other (See Comments)    Muscle aches   . Sulfa Antibiotics Other (See Comments)    unknown  . Vesicare [Solifenacin] Other (See Comments)    unknown  . Clindamycin/Lincomycin Other (See Comments)    Upset stomach  . Food Other (See Comments)    No spicy foods or black pepper, raw onions - causes gerd  . Ibuprofen Hives and Rash  . Lincomycin Hcl Other (See Comments)    Upset stomach  . Penicillins Rash    Has patient had a PCN reaction causing immediate rash, facial/tongue/throat swelling, SOB or lightheadedness with hypotension: Yes Has patient had a PCN reaction causing severe rash involving mucus membranes or skin necrosis: No Has patient had a PCN reaction that required hospitalization No Has patient had a PCN reaction occurring within the last 10 years: No If all of the above answers are "NO", then may proceed with Cephalosporin use.   Lisbeth Ply [Fesoterodine Fumarate Er] Other (See  Comments)    unknown  . Voltaren [Diclofenac [place] Follow up.   Specialty:  Skilled Nursing and [place] information: [address] [place] [address] [phone]        Minus Breeding, MD Follow up on 09/01/2017.   Specialty:  Cardiology Why:  F/U AS SCHEDULED Contact information: [address] [place] [place] [address] [phone]            The results of significant diagnostics from this hospitalization (including imaging, microbiology, ancillary and laboratory) are listed below for reference.    Significant Diagnostic Studies: Dg Chest 2 View  Result Date: 08/29/2017 CLINICAL DATA:  Shortness of breath for the past few days. Chest pain. EXAM: CHEST - 2 VIEW COMPARISON:  Chest x-ray dated March 06, 2017. FINDINGS: Stable mild cardiomegaly status post CABG. Normal pulmonary vascularity. Atherosclerotic calcification of the aortic arch. No focal consolidation, pleural effusion, or pneumothorax. No acute osseous abnormality. Unchanged left Bochdalek hernia. IMPRESSION: No active cardiopulmonary disease. Electronically Signed   By: Titus Dubin M.D.   On: 08/29/2017 08:11   Ct Angio Chest Pe W/cm &/or Wo Cm  Result Date: 08/29/2017 CLINICAL DATA:  Shortness of breath. EXAM: CT ANGIOGRAPHY CHEST WITH CONTRAST TECHNIQUE: Multidetector CT imaging of the chest was performed using the standard protocol during bolus administration of intravenous contrast. Multiplanar CT image reconstructions and MIPs were obtained to evaluate the vascular anatomy. CONTRAST:  32m ISOVUE-370 IOPAMIDOL (ISOVUE-370) INJECTION 76% COMPARISON:  Chest x-ray August 29, 2017 and chest CT August 03, 2016 FINDINGS: Cardiovascular: Atherosclerotic change in the nonaneurysmal aorta. Coronary artery calcifications. The heart is unchanged. No pulmonary emboli. Mediastinum/Nodes: Nodularity in the left  thyroid is stable measuring 15 mm. The esophagus is normal. No effusions. No adenopathy. Lungs/Pleura: Central airways are normal. No pneumothorax. There is a nodular density  along the right hemidiaphragm Doan best seen on coronal image 59 measuring 10 x 5 mm today versus 11 by 5 mm in July of 2017, without significant change. Numerous other bilateral pulmonary nodules are identified without significant interval change given difference in slice selection and thinner slices on today's study versus previous studies. Emphysematous changes, most focal in the medial right lung base. No masses. Bibasilar atelectasis. No suspicious infiltrates to suggest pneumonia. Upper Abdomen: No acute abnormality. Musculoskeletal: No chest wall abnormality. No acute or significant osseous findings. Review of the MIP images confirms the above findings. IMPRESSION: 1. No pulmonary emboli. 2. Stable bilateral pulmonary nodules, likely benign given stability. These nodules have been stable since February 2018 and basilar nodules have been stable since 2017. 3. Atherosclerotic changes in the nonaneurysmal aorta. Coronary artery calcifications. 4. Stable 15 mm nodule in the left thyroid lobe. An ultrasound could better evaluate if clinically warranted. 5. No other acute abnormalities. Aortic Atherosclerosis (ICD10-I70.0) and Emphysema (ICD10-J43.9). Electronically Signed   By: Dorise Bullion III M.D   On: 08/29/2017 12:01    Microbiology: Recent Results (from the past 240 hour(s))  MRSA PCR Screening     Status: None   Collection Time: 08/29/17  5:22 PM  Result Value Ref Range Status   MRSA by PCR NEGATIVE NEGATIVE Final    Comment:        The GeneXpert MRSA Assay (FDA approved for NASAL specimens only), is one component of a comprehensive MRSA colonization surveillance program. It is not intended to diagnose MRSA infection nor to guide or monitor treatment for MRSA infections. Performed at [facility], [place] [address] [place], [place] [address]      Labs: Basic Metabolic Panel: Recent Labs  Lab 08/29/17 0747 08/30/17 0831  NA 132* 137  K 3.4* 4.2  CL 95* 101  CO2 26 25  GLUCOSE 90 103*  BUN 14 25*  CREATININE 0.72 0.89  CALCIUM 9.1 9.6  MG  --  2.2   Liver Function Tests: Recent Labs  Lab 08/29/17 0747  AST 35  ALT 15  ALKPHOS 45  BILITOT 0.6  PROT 8.7*  ALBUMIN 3.9   No results for input(s): LIPASE, AMYLASE in the last 168 hours. No results for input(s): AMMONIA in the last 168 hours. CBC: Recent Labs  Lab 08/29/17 0747 08/30/17 0831  WBC 2.3* 2.5*  NEUTROABS 1.0* 1.1*  HGB 10.9* 10.7*  HCT 32.6* 31.5*  MCV 97.6 98.1  PLT 277 294   Cardiac Enzymes: Recent Labs  Lab 08/29/17 1509 08/29/17 1719 08/29/17 2007  TROPONINI <0.03 <0.03 <0.03   BNP: BNP (last 3 results) Recent Labs    02/17/17 1341  BNP 62.4    ProBNP (last 3 results) No results for input(s): PROBNP in the last 8760 hours.  CBG: No results for input(s): GLUCAP in the last 168 hours.     Signed:  Irine Seal MD.  [facility] 08/30/2017, 4:21 PM

## 2017-08-30 NOTE — Progress Notes (Signed)
Discharge patient home to assisted living with husband.  Discussed discharge instructions, follow up appointments and medicatons.  Sent instruction home with patient.  Called Assisted living spoke to nurse and gave update.

## 2017-09-01 ENCOUNTER — Encounter: Payer: Self-pay | Admitting: Cardiology

## 2017-09-01 ENCOUNTER — Ambulatory Visit (INDEPENDENT_AMBULATORY_CARE_PROVIDER_SITE_OTHER): Payer: PPO | Admitting: Cardiology

## 2017-09-01 VITALS — BP 153/87 | HR 66 | Ht 63.0 in | Wt 95.2 lb

## 2017-09-01 DIAGNOSIS — E785 Hyperlipidemia, unspecified: Secondary | ICD-10-CM

## 2017-09-01 DIAGNOSIS — R1084 Generalized abdominal pain: Secondary | ICD-10-CM

## 2017-09-01 DIAGNOSIS — I10 Essential (primary) hypertension: Secondary | ICD-10-CM | POA: Diagnosis not present

## 2017-09-01 NOTE — Patient Instructions (Signed)
Medication Instructions: Take Hydralazine at 10 am and 10 pm.  If you need a refill on your cardiac medications before your next appointment, please call your pharmacy.    Follow-Up: Your physician wants you to follow-up one month with Jory Sims, DNP    Special Instructions: Please keep nitroglycerin at the bedside Please make an appointment  With Dr. Zenovia Jarred  Thank you for choosing [facility]!!

## 2017-09-02 ENCOUNTER — Encounter: Payer: Self-pay | Admitting: Physician Assistant

## 2017-09-02 ENCOUNTER — Telehealth: Payer: Self-pay | Admitting: Internal Medicine

## 2017-09-02 ENCOUNTER — Telehealth: Payer: Self-pay | Admitting: *Deleted

## 2017-09-02 ENCOUNTER — Telehealth: Payer: Self-pay | Admitting: Cardiology

## 2017-09-02 NOTE — Telephone Encounter (Signed)
error 

## 2017-09-02 NOTE — Telephone Encounter (Signed)
Dr Norman Herrlich, please see note below and advise.

## 2017-09-02 NOTE — Telephone Encounter (Signed)
Pt's daughter [name] returned your call. He was fine with appt on 09/09/17. I am mailing a new pt packet to [address] Dr. Cleophas Dunker, [place] [address] per her request just for this time.

## 2017-09-02 NOTE — Telephone Encounter (Signed)
Discussed with [name] already [facility]

## 2017-09-02 NOTE — Telephone Encounter (Signed)
Malachy Mood [employer] banker, [employer]) calling with [facility],  Pt c/o medication issue:  1. Name of Medication: hydrALAZINE (APRESOLINE) 25 MG tablet     2. How are you currently taking this medication (dosage and times per day)? 25 mg and 2x daily   3. Are you having a reaction (difficulty breathing--STAT)? no  4. What is your medication issue? Patient currently takes medication at 10 am and 10 pm and would like to take it at 9 instead. Malachy Mood would like to verify   Also Patient was instructed to keep nitroglycerin at bed time and patient cannot do that as she is in an assisted living facility.

## 2017-09-02 NOTE — Telephone Encounter (Signed)
Left voicemail asking for a return call. I have tentatively scheduled patient to see Nicoletta Ba, PA-C on Wednesday, 09/09/17 at 230 pm. She should see Dr Hilarie Fredrickson after that.

## 2017-09-02 NOTE — Telephone Encounter (Signed)
Patient states while she was at [place] office yesterday he messaged Dr.Pyrtle to ask if pt could be seen by him and he agreed. Pt has gi hx with Dr.Medoff and no record in system of this. Please advise.

## 2017-09-02 NOTE — Telephone Encounter (Signed)
Returned the call to the Nurse at [facility]. She stated that per policy the patient cannot keep nitroglycerin at her bedside.   She would also like to give the patient her Hydralazine at 9 and 9 instead of 10 and 10. She has been informed that this is acceptable. She has verbalized her understanding.

## 2017-09-02 NOTE — Telephone Encounter (Signed)
-----   Message from Jerene Bears, MD sent at 09/02/2017  3:16 PM EDT ----- Can be with APP and then me if needed JMP  ----- Message ----- From: Larina Bras, CMA Sent: 09/02/2017   2:44 PM To: Jerene Bears, MD  Is this a work in patient or next available ----- Message ----- From: Jerene Bears, MD Sent: 09/01/2017   5:38 PM To: Minus Breeding, MD, Larina Bras, CMA  Yes, we will Thanks Alvina Filbert,  See below  ----- Message ----- From: Minus Breeding, MD Sent: 09/01/2017   4:40 PM To: Jerene Bears, MD  Ulice Dash,  I am going to refer this patient to you for abdominal pain.  Will you take her even though she has seen Medoff before?  Thanks.  Maylon Cos

## 2017-09-09 ENCOUNTER — Ambulatory Visit: Payer: PPO | Admitting: Physician Assistant

## 2017-09-09 ENCOUNTER — Encounter: Payer: Self-pay | Admitting: Physician Assistant

## 2017-09-09 VITALS — BP 114/50 | HR 68 | Ht 61.25 in | Wt 96.1 lb

## 2017-09-09 DIAGNOSIS — R1013 Epigastric pain: Secondary | ICD-10-CM

## 2017-09-09 DIAGNOSIS — R634 Abnormal weight loss: Secondary | ICD-10-CM

## 2017-09-09 MED ORDER — HYOSCYAMINE SULFATE SL 0.125 MG SL SUBL: SUBLINGUAL_TABLET | SUBLINGUAL | 1 refills | Status: DC

## 2017-09-09 NOTE — Patient Instructions (Signed)
If you are age 82 or older, your body mass index should be between 23-30. Your Body mass index is 18.01 kg/m. If this is out of the aforementioned range listed, please consider follow up with your Primary Care Provider.   We [facility] a prescription to [facility] for the Levsin SL tablets.  You have been scheduled for a HIDA scan at [facility] (1st floor) on Tuesday 4-16. Marland Kitchen Please arrive 15 minutes prior to your scheduled appointment at  6:62 am Make certain not to have anything to eat or drink at least 6 hours prior to your test. Should this appointment date or time not work well for you, please call radiology scheduling at [phone].  Do not take any pain medication or Prilosec 20 mg. _____________________________________________________________________ hepatobiliary (HIDA) scan is an imaging procedure used to diagnose problems in the liver, gallbladder and bile ducts. In the HIDA scan, a radioactive chemical or tracer is injected into a vein in your arm. The tracer is handled by the liver like bile. Bile is a fluid produced and excreted by your liver that helps your digestive system break down fats in the foods you eat. Bile is stored in your gallbladder and the gallbladder releases the bile when you eat a meal. A special nuclear medicine scanner (gamma camera) tracks the flow of the tracer from your liver into your gallbladder and small intestine.  During your HIDA scan  You'll be asked to change into a hospital gown before your HIDA scan begins. Your health care team will position you on a table, usually on your back. The radioactive tracer is then injected into a vein in your arm.The tracer travels through your bloodstream to your liver, where it's taken up by the bile-producing cells. The radioactive tracer travels with the bile from your liver into your gallbladder and through your bile ducts to your small intestine.You may feel some pressure while the radioactive tracer is injected  into your vein. As you lie on the table, a special gamma camera is positioned over your abdomen taking pictures of the tracer as it moves through your body. The gamma camera takes pictures continually for about an hour. You'll need to keep still during the HIDA scan. This can become uncomfortable, but you may find that you can lessen the discomfort by taking deep breaths and thinking about other things. Tell your health care team if you're uncomfortable. The radiologist will watch on a computer the progress of the radioactive tracer through your body. The HIDA scan may be stopped when the radioactive tracer is seen in the gallbladder and enters your small intestine. This typically takes about an hour. In some cases extra imaging will be performed if original images aren't satisfactory, if morphine is given to help visualize the gallbladder or if the medication CCK is given to look at the contraction of the gallbladder. This test typically takes 2 hours to complete. ________________________________________________________________________

## 2017-09-09 NOTE — Progress Notes (Addendum)
Subjective:    Patient ID: [name], female    DOB: 05-20-1932, 84 y.o.   MRN: [id number]  HPI Keerat is a very nice 84 year old white female, new to GI today referred by Dr. Percival Spanish to Dr. Hilarie Fredrickson for evaluation of recurrent abdominal pain. Patient has history of coronary artery disease a status post CABG, status post MI.  She had a non-ST MI about 4 months ago with 90% stenosis of the circumflex which required PCI.  She is now on Plavix.  Also with history of hypertension, mixed mild dementia, GERD, Raynauds syndrome, IBS, hyperlipidemia, and multiple myeloma which is currently in remission. Patient is known previously to Dr. Richmond Campbell and had been followed by him for 12-15 years.  She has had prior endoscopic evaluation.  She had seen him as recently as March 2019 due to question of intestinal angina.  His most recent note felt symptoms consistent with functional constipation and no further evaluation was needed.  She was to maintain MiraLAX daily. Patient says she has had her current symptoms over the past couple of years with gradual worsening over the past 6 months She says she is having symptoms almost daily at this point.  She describes upper abdominal pain across her abdomen below her ribs which she describes as a squeezing pressure type pain which comes on usually within an hour or 2 of eating.  She usually does not have symptoms after breakfast but typically will have symptoms after lunch and dinner.  The pain is constant when present very uncomfortable no radiation to her back.  May last 1-2 hours.  She does not have any associated diaphoresis shortness of breath nausea or vomiting.  The pain will typically gradually resolve.  She says she has to sit down when she has the pain and tries to stretch out. She had been told previously that she may have mesenteric ischemia and had been instructed to eat 4-5 smaller meals per day.  She has been trying to do this but finds it difficult  as she is in a assisted living facility.  She is had a gradual weight loss from 112-96 pounds over the past 2 years. She generally has constipation which she has had for years with the IBS, no recent changes.  She usually takes three quarters of a dose of MiraLAX per day most days with good results.  She had a recent episode of more severe constipation resolved with Dulcolax. Patient did undergo an arteriogram per Dr. 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  However on arteriography there was only a 60% renal artery stenosis on the left and no discernible celiac or SMA stenoses, so no intervention was done. More recently she underwent CT angios of the abdomen and pelvis on 07/23/2017 which showed the SMA to be patent, IMA patent, there did appear to be significant narrowing of the proximal celiac axis secondary to median arcuate ligament compression.  Normal-appearing gallbladder liver and pancreas, there is at least moderate bilateral focal proximal renal artery stenosis. Patient and family are quite concerned about her symptoms and want further evaluation.  Review of Systems Pertinent positive and negative review of systems were noted in the above HPI section.  All other review of systems was otherwise negative.  Outpatient Encounter Medications as of 09/09/2017  Medication Sig  . acetaminophen (TYLENOL) 650 MG CR tablet Take 1,300 mg by mouth 2 (two) times daily.  Marland Kitchen  aspirin EC 81 MG tablet Take 1 tablet (81 mg total) by mouth daily.  . Calcium Carb-Cholecalciferol (CALCIUM 500 + D3) 500-600 MG-UNIT TABS Take 1 tablet by mouth daily.  . carvedilol (COREG) 3.125 MG tablet Take 1 tablet (3.125 mg total) by mouth 2 (two) times daily with a meal.  . clopidogrel (PLAVIX) 75 MG tablet Take last dose of Brilinta 04/01/17 in evening. Take 4 tablets of Clopidogrel on 04/02/17 then take 1 tablet by mouth  daily thereafter. (Patient taking differently: Take 75 mg by mouth daily. )  . docusate sodium (COLACE) 100 MG capsule Take 1 capsule (100 mg total) by mouth daily as needed. (Patient taking differently: Take 100 mg by mouth See admin instructions. Take 1 capsule (132m) by mouth daily and 1 capsule (1084m daily as needed.)  . hydrALAZINE (APRESOLINE) 25 MG tablet Take 25 mg by mouth 2 (two) times daily.  . iron polysaccharides (NU-IRON) 150 MG capsule Take 150 mg by mouth 2 (two) times daily.   . isosorbide mononitrate (IMDUR) 60 MG 24 hr tablet Take 60 mg by mouth daily.  . Marland Kitchenevocetirizine (XYZAL) 5 MG tablet Take 1 tablet (5 mg total) by mouth every evening. (Patient taking differently: Take 5 mg by mouth at bedtime. )  . LORazepam (ATIVAN) 0.5 MG tablet Take 0.25-0.5 mg by mouth See admin instructions. Take 0.25 mg in the morning and 0.66m12mt bedtime.  May take an additional 0.5 mg twice daily as needed for anxiety  . Melatonin 5 MG TABS Take 5 mg by mouth at bedtime.  . memantine (NAMENDA) 10 MG tablet Take 10 mg by mouth 2 (two) times daily.   . mirabegron ER (MYRBETRIQ) 25 MG TB24 tablet Take 25 mg by mouth every morning.   . montelukast (SINGULAIR) 10 MG tablet Take 10 mg by mouth at bedtime.   . Multiple Vitamins-Minerals (CENTRUM SILVER 50+WOMEN) TABS Take 1 tablet by mouth daily.  . oMarland Kitcheneprazole (PRILOSEC) 20 MG capsule Take 20 mg by mouth 2 (two) times daily before a meal.  . polyethylene glycol (MIRALAX / GLYCOLAX) packet Take 17 g by mouth daily.   . PMarland KitchenEMARIN vaginal cream Place 1 Applicatorful vaginally once a week.   . Probiotic Product (VSL#3 PO) Take 1 packet by mouth every morning.  . sertraline (ZOLOFT) 25 MG tablet Take 25-50 mg by mouth See admin instructions. Takes 266m37m AM and 50mg39mPM.   . Hyoscyamine Sulfate SL (LEVSIN/SL) 0.125 MG SUBL Take 1 tablet, dissolve under the tongue every 6 hours as needed for abdominal pain.  . nitroGLYCERIN (NITROSTAT) 0.4 MG SL tablet Place  1 tablet (0.4 mg total) under the tongue every 5 (five) minutes as needed for chest pain. Reported on 11/12/2015 (Patient not taking: Reported on 09/09/2017)   No facility-administered encounter medications on file as of 09/09/2017.    Allergies  Allergen Reactions  . Butazolidin [Phenylbutazone] Swelling and Rash  . Cephalosporins Other (See Comments)    unknown  . Codeine Nausea Only  . Levaquin [Levofloxacin] Shortness Of Breath  . Macrodantin Other (See Comments)    Double vision  . Morphine And Related Other (See Comments)    hallucinations  . Trimethoprim Nausea Only  . Buprenorphine     From MAR  . Cilostazol Other (See Comments)    Stomach upset  . Dexilant [Dexlansoprazole] Diarrhea  . Diclofenac     Documented on MAR  . Erythromycin Nausea And Vomiting    All "mycin" drugs  . Lactose Intolerance (  Gi)   . Morphine   . Nitrofurantoin     Double vision  . Simvastatin Other (See Comments)    Muscle aches   . Sulfa Antibiotics Other (See Comments)    unknown  . Vesicare [Solifenacin] Other (See Comments)    unknown  . Clindamycin/Lincomycin Other (See Comments)    Upset stomach  . Food Other (See Comments)    No spicy foods or black pepper, raw onions - causes gerd  . Ibuprofen Hives and Rash  . Lincomycin Hcl Other (See Comments)    Upset stomach  . Penicillins Rash    Has patient had a PCN reaction causing immediate rash, facial/tongue/throat swelling, SOB or lightheadedness with hypotension: Yes Has patient had a PCN reaction causing severe rash involving mucus membranes or skin necrosis: No Has patient had a PCN reaction that required hospitalization No Has patient had a PCN reaction occurring within the last 10 years: No If all of the above answers are "NO", then may proceed with Cephalosporin use.   Lisbeth Ply [Fesoterodine Fumarate Er] Other (See Comments)    unknown  . Voltaren [Diclofenac Sodium] Rash   Patient Active Problem List   Diagnosis Date Noted    . Left thyroid nodule 08/29/2017  . Abdominal pain 07/31/2017  . Unsteadiness on feet   . Unstable angina pectoris ([place])   . Tinea pedis   . Spinal stenosis of lumbar region   . Somnolence   . Seasonal allergic rhinitis   . Restless legs syndrome   . Raynaud's syndrome   . Raynauds syndrome   . Radiculopathy   . Panic disorder   . Ovarian cyst, bilateral   . Osteopenia   . Myocardial infarction ([place])   . Muscle weakness (generalized)   . Mixed incontinence   . Mild chronic anemia   . Major depressive disorder   . Long term (current) use of aspirin   . Intermediate coronary syndrome ([place])   . Incontinent of urine   . IBS (irritable bowel syndrome)   . Hypercholesteremia   . Hx of CABG   . GERD (gastroesophageal reflux disease)   . Dementia   . Chest pain   . Atherosclerotic heart disease of native coronary artery without angina pectoris   . Arthritis   . Anxiety   . Allergic rhinitis   . Abnormal weight loss   . NSTEMI (non-ST elevated myocardial infarction) ([place]) 02/02/2017  . SOB (shortness of breath) 09/02/2016  . Dizziness 09/02/2016  . Non-ST elevation (NSTEMI) myocardial infarction ([place]) 11/26/2015  . Polymyalgia rheumatica ([place]) 11/26/2015  . Hypokalemia 11/26/2015  . Mild persistent asthma 11/12/2015  . Coronary artery disease involving autologous artery coronary bypass graft with angina pectoris with documented spasm ([place]) 11/12/2015  . Multiple myeloma in remission ([place]) 11/12/2015  . Current use of beta blocker 11/12/2015  . Gastroesophageal reflux disease without esophagitis 11/12/2015  . Other allergic rhinitis 11/12/2015  . Weight loss, non-intentional 10/03/2015  . Asthma 09/24/2015  . Asthma in adult without complication 96/28/3662  . Seasonal allergic rhinitis due to pollen 09/24/2015  . Cervical radiculopathy 07/25/2015  . Chronic coronary artery disease 07/25/2015  . Coronary artery disease 07/25/2015  . Disturbance in sleep behavior 07/25/2015   . Elevated CK 07/25/2015  . Elevated creatine kinase level 07/25/2015  . Generalized osteoarthritis of hand 07/25/2015  . Other long term (current) drug therapy 07/25/2015  . Overactive bladder 07/25/2015  . Restless leg 07/25/2015  . Restless leg syndrome 07/25/2015  . Sleep  disturbances 07/25/2015  . Idiopathic peripheral neuropathy 07/04/2015  . Lumbar radiculopathy, chronic 07/04/2015  . 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 Risk for falls 07/04/2015  . Sleep disturbance 07/04/2015  . Chest pain with moderate risk for cardiac etiology 06/13/2015  . Right sciatic nerve pain 10/19/2014  . Abnormal thyroid blood test 08/25/2014  . Chronic fatigue 08/09/2014  . Multiple myeloma ([place]) 07/21/2013  . Depression 01/14/2013  . Generalized anxiety disorder 01/14/2013  . Gastroesophageal reflux disease 01/13/2013  . Urine test positive for microalbuminuria 12/08/2011  . Cyst of ovary 12/17/2010  . KNEE PAIN, BILATERAL 08/15/2010  . Abnormal gait 08/15/2010  . Atherosclerosis of coronary artery bypass graft 10/11/2009  . CHEST PAIN, NON-CARDIAC 10/11/2009  . HIP PAIN, RIGHT 08/13/2009  . GREATER TROCHANTERIC BURSITIS 08/13/2009  . LUMBOSACRAL SPONDYLOSIS WITHOUT MYELOPATHY 11/10/2008  . Osteoarthritis of lumbosacral spine without myelopathy 11/10/2008  . FOOT PAIN, BILATERAL 06/30/2008  . Hyperlipidemia 06/22/2008  . Hypertension 06/22/2008  . Raynaud's disease 06/22/2008  . BURSITIS, LEFT SHOULDER 06/22/2008   Social History   Socioeconomic History  . Marital status: Married    Spouse name: Not on file  . Number of children: 4  . Years of education: Not on file  . Highest education level: Not on file  Occupational History  . Occupation: homemaker   . Occupation: Materials engineer education  Social Needs  . Financial resource strain: Not on file  . Food insecurity:    Worry: Not on file    Inability: Not on file  . Transportation needs:    Medical: Not on file    Non-medical:  Not on file  Tobacco Use  . Smoking status: Never Smoker  . Smokeless tobacco: Never Used  Substance and Sexual Activity  . Alcohol use: Yes    Comment: occasional  . Drug use: No  . Sexual activity: Never  Lifestyle  . Physical activity:    Days per week: Not on file    Minutes per session: Not on file  . Stress: Not on file  Relationships  . Social connections:    Talks on phone: Not on file    Gets together: Not on file    Attends religious service: Not on file    Active member of club or organization: Not on file    Attends meetings of clubs or organizations: Not on file    Relationship status: Not on file  . Intimate partner violence:    Fear of current or ex partner: Not on file    Emotionally abused: Not on file    Physically abused: Not on file    Forced sexual activity: Not on file  Other Topics Concern  . Not on file  Social History Narrative  . Not on file    Ms. Persons's family history includes Breast cancer in her paternal aunt; CAD in her mother; Heart attack in her mother; Obesity in her sister; Ulcers in her father.      Objective:    Vitals:   09/09/17 1439  BP: (!) 114/50  Pulse: 68    Physical Exam; Well-developed elderly white female in no acute distress, very pleasant accompanied by her daughter, height 5 foot 1, weight 96, BMI 18.  Blood pressure 114/50, pulse 68.  HEENT; nontraumatic normocephalic EOMI PERRLA sclera anicteric, Cardiovascular; regular rate and rhythm with S1-S2, she has a sternal incisional scar, Pulmonary ;clear bilaterally, Abdomen; soft, nontender nondistended no palpable mass or hepatosplenomegaly incisional scars, no bruit heard.  Rectal; exam  not done, Extremities ;no clubbing cyanosis or edema skin warm and dry, Neuro psych; mood and affect appropriate       Assessment & Plan:   #31 84 year old white female with 2-year history of very frequent postprandial abdominal pain, becoming more frequent and intense over the past  6 months.  This is been associated with a 20 pound weight loss over the past 2 years. She had had prior diagnosis of possible mesenteric ischemia.  However arteriography done in November 2018 per vascular surgery showed no discernible celiac SMA or arterial stenosis. More recent CT angios of the abdomen and pelvis February 2019 did show what appears to be significant narrowing of the origin of the celiac due to compression by the median arcuate  Ligament.  Symptoms certainly consistent with seen typically with mesenteric insufficiency.  Need to consider median arcuate ligament compression syndrome as etiology for her symptoms.  We will also rule out biliary dyskinesia.  #2 coronary artery disease status post prior CABG and fairly recent MI with PCI December 2018 #3 chronic antiplatelet therapy on Plavix #4 multiple myeloma in remission   #5 GERD #6.  Hypertension #7.  Raynauds #8  IBS/chronic functional constipation  Plan; Long discussion with the patient and her daughter regarding her nutrition, and trying to boost calories and protein while following accommodations for smaller more frequent meals. We will continue to work with the dietitian at [facility] Will give her a trial of Levsin sublingual 1 p.o. every 6 hours as needed during episodes of acute pain just to see if this has any effect on her symptoms. Scheduled for CCK HIDA scan Patient will follow up with Dr. Hilarie Fredrickson in about 1 month.  I will also discuss her vascular imaging findings with Dr. Hilarie Fredrickson, and if CCK HIDA scan is negative, will need to consider the median arcuate ligament syndrome, and surgical/vascular referral.  Alfredia Ferguson PA-C 09/09/2017   Cc: [place], [place]: Reviewed and agree with management. On reviewing symptoms discussed with Nanna Ertle and the patient, reviewing previous endoscopic and radiographic imaging including mesenteric angiography I feel the most likely cause of her upper  abdominal, bandlike pain with weight loss is mesenteric insufficiency.  This is felt most likely due to median arcuate ligament syndrome; this was suggested by CT abdomen and pelvis angiography performed on 07/23/2017.  Her previous mesenteric angiography of the celiac artery showed patency, but the CT angiography showed compression.  I contacted Dr. Trula Slade and asked that he review the studies, he did so and feels that median arcuate ligament syndrome is possible.  He indicated that surgery for this condition can be complex and certainly carry risk particularly at her age and with her medical issues. In the past he has seen celiac axis block offered to these patients prior to surgery to see if celiac plexus block improves symptoms (pain).  If so, surgery would have a high risk of success.  I contacted Dr. Pascal Lux with IR and he states that Dr. Kathlene Cote and Dr. Vernard Gambles are performing celiac plexus block.  Dr. Kathlene Cote and/or Dr.Hassell would require a office consultation to consider celiac block.  I have discussed this at length with the patient's daughter, Sandria Senter by phone today.  She understands our discussion and will discuss this further with her mother.  She will then let me know if they wish to consider celiac plexus block, if so we would refer her to Dr. Kathlene Cote or Dr. Vernard Gambles.  I will wait  to hear from them.   Pyrtle, Lajuan Lines, MD

## 2017-09-17 ENCOUNTER — Telehealth: Payer: Self-pay | Admitting: Physician Assistant

## 2017-09-18 NOTE — Telephone Encounter (Signed)
Patient is advised as per radiology to not take the Ativan for 6 hours prior to testing.

## 2017-09-22 ENCOUNTER — Encounter (HOSPITAL_COMMUNITY)
Admission: RE | Admit: 2017-09-22 | Discharge: 2017-09-22 | Disposition: A | Discharge status: Home / Self Care | Payer: PPO | Source: Ambulatory Visit | Attending: Physician Assistant | Admitting: Physician Assistant

## 2017-09-22 DIAGNOSIS — R634 Abnormal weight loss: Secondary | ICD-10-CM | POA: Diagnosis not present

## 2017-09-22 DIAGNOSIS — R1013 Epigastric pain: Secondary | ICD-10-CM | POA: Diagnosis not present

## 2017-09-22 MED ORDER — TECHNETIUM TC 99M MEBROFENIN IV KIT
5.2000 | PACK | Freq: Once | INTRAVENOUS | Status: AC | PRN
  Administered 2017-09-22: 5.2 via INTRAVENOUS

## 2017-09-28 ENCOUNTER — Telehealth: Payer: Self-pay | Admitting: Physician Assistant

## 2017-09-28 ENCOUNTER — Telehealth: Payer: Self-pay

## 2017-09-28 NOTE — Telephone Encounter (Signed)
Left a message. See the imaging results. Normal.

## 2017-09-28 NOTE — Telephone Encounter (Signed)
Advised of her normal CCK HIDA.  She continues to have her same issues. She is unclear what she is "going to do" about this. She asks for feedback. She says she is unable to comply with the diet plan. She has uncontrollable diarrhea. She has abdominal discomfort. Dr Hilarie Fredrickson does not have any openings until late June.

## 2017-09-28 NOTE — Telephone Encounter (Signed)
[name]- I was not aware she has been having terrible diarrhea -  She has hx chronic constipation and has colace and Miralax daily on her med list-  Please call her and find out more about the diarrhea - how long, how many daily etc , and if still taking Miralax. Please let her know DR Hilarie Fredrickson was waiting for one of the vascular surgeons to call him back about  Next step /plan  Regarding mesenteric vascular disease.  Also I need her records from dr Earlean Shawl- last procedures and path Thanks!

## 2017-09-29 DIAGNOSIS — M25551 Pain in right hip: Secondary | ICD-10-CM | POA: Diagnosis not present

## 2017-09-29 NOTE — Telephone Encounter (Signed)
Oh my.. OK thank you

## 2017-09-29 NOTE — Telephone Encounter (Signed)
20 minute conversation with the daughter Sandria Senter. She is very clear that the vascular surgeon is to be Dr Cyndi Lennert [facility]. She clarifies that the diet is a problem for her mother because of her preferences. She has always eaten as she chooses. She feels the mild dementia makes this more difficult. She states decisions of the medical plan need to be discussed with her. She is medical POA. I spoke to the patient. She states no diarrhea. Just a one time thing. Normal bowel movements now.

## 2017-09-30 ENCOUNTER — Telehealth: Payer: Self-pay | Admitting: Physician Assistant

## 2017-09-30 NOTE — Telephone Encounter (Signed)
Pt would like to speak with you regarding symptoms that she experienced yesterday and this morning.

## 2017-10-01 NOTE — Telephone Encounter (Signed)
Phone number is incorrect. Called to daughter who said she did call us. She gave me her mother's phone number. Complains of indigestion despite Omeprazole twice daily dosing. She is adhering to the 6"six small meals" daily as much as she is able. Reports eating half a sandwich, a cup of oatmeal, half an apple, etc. She tried both Levsin and Gaviscon without improvement of the indigestion. No bowel movement changes, fever or nausea. Not SOB. She does feel better today and has not had a recurrence of her symptoms. She states she thinks the Omeprazole has stopped being effective. Recommendations?

## 2017-10-02 ENCOUNTER — Telehealth: Payer: Self-pay | Admitting: Internal Medicine

## 2017-10-02 ENCOUNTER — Other Ambulatory Visit: Payer: Self-pay

## 2017-10-02 MED ORDER — PANTOPRAZOLE SODIUM 40 MG PO TBEC: 40.0000 mg | DELAYED_RELEASE_TABLET | Freq: Two times a day (BID) | ORAL | 3 refills | Status: DC

## 2017-10-02 NOTE — Telephone Encounter (Signed)
Please see my addendum to the patient's recent office note with Nicoletta Ba, PA-C for details

## 2017-10-02 NOTE — Telephone Encounter (Signed)
Pts daughter and script sent to pharmacy.

## 2017-10-02 NOTE — Telephone Encounter (Signed)
Pt's daughter [name] wanted to let Dr. Hilarie Fredrickson know that she heard his message and she is available to speak with him today before 1:30pm or after 4:00pm.

## 2017-10-02 NOTE — Telephone Encounter (Signed)
Lets switch her to Protonix 40 mg po BID ac breakfast and ac dinner

## 2017-10-05 ENCOUNTER — Encounter: Payer: Self-pay | Admitting: Adult Health

## 2017-10-05 ENCOUNTER — Ambulatory Visit: Payer: PPO | Admitting: Adult Health

## 2017-10-05 VITALS — BP 122/50 | HR 64 | Ht 61.0 in | Wt 98.0 lb

## 2017-10-05 DIAGNOSIS — I10 Essential (primary) hypertension: Secondary | ICD-10-CM

## 2017-10-05 DIAGNOSIS — I2510 Atherosclerotic heart disease of native coronary artery without angina pectoris: Secondary | ICD-10-CM | POA: Diagnosis not present

## 2017-10-05 DIAGNOSIS — R1084 Generalized abdominal pain: Secondary | ICD-10-CM

## 2017-10-05 NOTE — Patient Instructions (Signed)
Medication Instructions:  NO CHANGES- Your physician recommends that you continue on your current medications as directed. Please refer to the Current Medication list given to you today.  If you need a refill on your cardiac medications before your next appointment, please call your pharmacy.  Follow-Up: Your physician wants you to follow-up in: [place] should receive a reminder letter in the mail two months in advance. If you do not receive a letter, please call our office 02-2018 to schedule the 04-2018 follow-up appointment.   Thank you for choosing [facility]!!

## 2017-10-05 NOTE — Progress Notes (Signed)
Cardiology Office Note   Date:  10/05/2017   ID:  Tammy, [name] Oct 30, 1931, MRN [id number]  PCP:  [facility]  Cardiologist: Dr. Percival Spanish   Chief Complaint  Patient presents with  . Follow-up    1 month  . Coronary Artery Disease  . Abdominal Pain     History of Present Illness: [name] is a 84 y.o. female who presents for ongoing assessment and management of CAD with known history of PCI of a Cx with 90% stenosis,01/2017, CABG LIMA to LAD, SVG to RCA 08/2009, labile hypertension, non-cardiac chest pain, abdominal pain and significant anxiety.   She often has multiple somatic complaints. She was last seen by Dr. Percival Spanish on 3/2\26/2019. At that time he considered increasing hydralazine to TID from BID if BP was still labile.For now, he kept it at Q 12 hours and explained how it should be taken. She was referred to GI for abdominal pain to assess for intestinal ischemia.   She saw Dr. Montine Circle on referral from Dr. Percival Spanish. 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  She was to follow up with neurology.   BP recordings are brought with her today. They are essentially normal concerning consistency of her BP. However, she does have some high recordings which are the result of getting her medications later in the day. She states that the nurses do not always giver her the medications at the same time in the evenings with ranges of times between 2-3 hours. She does not wish to take a middle day dose of hydralazine as she is too busy during the afternoon to be available to take her medications.    Past Medical History:  Diagnosis Date  . Anxiety   . Arthritis   . CAD (coronary artery disease)   . Chondrocostal junction syndrome   . Chronic fatigue   . Dementia    without Behavioral Disturbance  . GERD (gastroesophageal reflux disease)   . Hx of CABG   . Hyperlipidemia    GOAL LDL <70  . Hypertension   . IBS (irritable bowel  syndrome)   . Incontinent of urine   . Jaundice    yellow jaundice 3rd grade  . Major depressive disorder   . Mild chronic anemia   . Mixed incontinence   . Multiple myeloma (HCC)    not having achieved remission  . NSTEMI (non-ST elevated myocardial infarction) ([place]) 01/31/2017  . Osteopenia   . Ovarian cyst, bilateral    Rt complex  . Overactive bladder   . Panic disorder   . Pulmonary nodules   . Radiculopathy   . Raynaud's syndrome   . Restless legs syndrome   . Spinal stenosis of lumbar region   . Thyroid nodule   . Unsteadiness on feet     Past Surgical History:  Procedure Laterality Date  . BUNIONECTOMY WITH HAMMERTOE RECONSTRUCTION Right 08/04/2012   Procedure:  HAMMERTOE RECONSTRUCTION;  Surgeon: Colin Rhein, MD;  Location: [facility];  Service: Orthopedics;  Laterality: Right;  HAMMER TOE RECONSTRUCTION PROBABLE FLEXOR DIGITORUM LONGUS TO PROXIMAL PHALANX TRANSFER LATERALIZED   . CAPSULOTOMY Right 08/04/2012   Procedure: CAPSULOTOMY;  Surgeon: Colin Rhein, MD;  Location: [facility];  Service: Orthopedics;  Laterality: Right;  RIGHT 2ND TOE METATARSOPHALANGEAL JOINT DORSAL CAPSULOTOMY   . CATARACT EXTRACTION, BILATERAL     both cataracts  . COLONOSCOPY    . CORONARY ANGIOPLASTY WITH STENT  PLACEMENT  2014   2 stents  . CORONARY ARTERY BYPASS GRAFT  2007   LIMA to LAD, SVG to RCA  non-ST segment MI 08/2009 (Dr. Tamala Julian)  . CORONARY STENT INTERVENTION N/A 02/02/2017   Procedure: CORONARY STENT INTERVENTION;  Surgeon: Belva Crome, MD;  Location: [facility];  Service: Cardiovascular;  Laterality: N/A;  Prox CFX  . DILATION AND CURETTAGE OF UTERUS    . LEFT HEART CATH AND CORS/GRAFTS ANGIOGRAPHY N/A 02/02/2017   Procedure: LEFT HEART CATH AND CORS/GRAFTS ANGIOGRAPHY;  Surgeon: Belva Crome, MD;  Location: [facility];  Service: Cardiovascular;  Laterality: N/A;  . LEFT HEART CATHETERIZATION WITH CORONARY/GRAFT ANGIOGRAM N/A  07/09/2012   Procedure: LEFT HEART CATHETERIZATION WITH Beatrix Fetters;  Surgeon: Sinclair Grooms, MD;  Location: [facility];  Service: Cardiovascular;  Laterality: N/A;  . TONSILLECTOMY AND ADENOIDECTOMY    . UPPER GASTROINTESTINAL ENDOSCOPY    . VISCERAL ANGIOGRAPHY N/A 04/15/2017   Procedure: VISCERAL ANGIOGRAPHY;  Surgeon: Waynetta Sandy, MD;  Location: [facility];  Service: Cardiovascular;  Laterality: N/A;     Current Outpatient Medications  Medication Sig Dispense Refill  . acetaminophen (TYLENOL) 650 MG CR tablet Take 1,300 mg by mouth 2 (two) times daily.    Marland Kitchen aspirin EC 81 MG tablet Take 1 tablet (81 mg total) by mouth daily. 90 tablet 3  . Calcium Carb-Cholecalciferol (CALCIUM 500 + D3) 500-600 MG-UNIT TABS Take 1 tablet by mouth daily.    . carvedilol (COREG) 3.125 MG tablet Take 1 tablet (3.125 mg total) by mouth 2 (two) times daily with a meal. 60 tablet 3  . clopidogrel (PLAVIX) 75 MG tablet Take last dose of Brilinta 04/01/17 in evening. Take 4 tablets of Clopidogrel on 04/02/17 then take 1 tablet by mouth daily thereafter. (Patient taking differently: Take 75 mg by mouth daily. ) 94 tablet 1  . docusate sodium (COLACE) 100 MG capsule Take 1 capsule (100 mg total) by mouth daily as needed. (Patient taking differently: Take 100 mg by mouth See admin instructions. Take 1 capsule (139m) by mouth daily and 1 capsule (1061m daily as needed.) 30 capsule 2  . hydrALAZINE (APRESOLINE) 25 MG tablet Take 25 mg by mouth 2 (two) times daily.    . Marland Kitchenyoscyamine Sulfate SL (LEVSIN/SL) 0.125 MG SUBL Take 1 tablet, dissolve under the tongue every 6 hours as needed for abdominal pain. 20 each 1  . iron polysaccharides (NU-IRON) 150 MG capsule Take 150 mg by mouth 2 (two) times daily.     . isosorbide mononitrate (IMDUR) 60 MG 24 hr tablet Take 60 mg by mouth daily.    . Marland Kitchenevocetirizine (XYZAL) 5 MG tablet Take 1 tablet (5 mg total) by mouth every evening. (Patient  taking differently: Take 5 mg by mouth at bedtime. ) 30 tablet 5  . LORazepam (ATIVAN) 0.5 MG tablet Take 0.25-0.5 mg by mouth See admin instructions. Take 0.25 mg in the morning and 0.81m58mt bedtime.  May take an additional 0.5 mg twice daily as needed for anxiety    . Melatonin 5 MG TABS Take 5 mg by mouth at bedtime.    . memantine (NAMENDA) 10 MG tablet Take 10 mg by mouth 2 (two) times daily.     . mirabegron ER (MYRBETRIQ) 25 MG TB24 tablet Take 25 mg by mouth every morning.     . montelukast (SINGULAIR) 10 MG tablet Take 10 mg by mouth at bedtime.     .Marland Kitchen  Multiple Vitamins-Minerals (CENTRUM SILVER 50+WOMEN) TABS Take 1 tablet by mouth daily.    . nitroGLYCERIN (NITROSTAT) 0.4 MG SL tablet Place 1 tablet (0.4 mg total) under the tongue every 5 (five) minutes as needed for chest pain. Reported on 11/12/2015 25 tablet 2  . pantoprazole (PROTONIX) 40 MG tablet Take 1 tablet (40 mg total) by mouth 2 (two) times daily before a meal. 60 tablet 3  . polyethylene glycol (MIRALAX / GLYCOLAX) packet Take 17 g by mouth daily.     Marland Kitchen PREMARIN vaginal cream Place 1 Applicatorful vaginally once a week.     . Probiotic Product (VSL#3 PO) Take 1 packet by mouth every morning.    . sertraline (ZOLOFT) 25 MG tablet Take 25-50 mg by mouth See admin instructions. Takes 15m po AM and 567mpo PM.      No current facility-administered medications for this visit.     Allergies:   Butazolidin [phenylbutazone]; Cephalosporins; Codeine; Levaquin [levofloxacin]; Macrodantin; Morphine and related; Trimethoprim; Buprenorphine; Cilostazol; Dexilant [dexlansoprazole]; Diclofenac; Erythromycin; Lactose intolerance (gi); Morphine; Nitrofurantoin; Simvastatin; Sulfa antibiotics; Vesicare [solifenacin]; Clindamycin/lincomycin; Food; Ibuprofen; Lincomycin hcl; Penicillins; Toviaz [fesoterodine fumarate er]; and Voltaren [diclofenac sodium]    Social History:  The patient  reports that she has never smoked. She has never used  smokeless tobacco. She reports that she drinks alcohol. She reports that she does not use drugs.   Family History:  The patient's family history includes Breast cancer in her paternal aunt; CAD in her mother; Heart attack in her mother; Obesity in her sister; Ulcers in her father.    ROS: All other systems are reviewed and negative. Unless otherwise mentioned in H&P    PHYSICAL EXAM: VS:  BP (!) 122/50 (BP Location: Left Arm, Patient Position: Sitting, Cuff Size: Normal)   Pulse 64   Ht '5\' 1"'  (1.549 m)   Wt 98 lb (44.5 kg)   BMI 18.52 kg/m  , BMI Body mass index is 18.52 kg/m. GEN: Well nourished, well developed, in no acute distress  HEENT: normal  Neck: no JVD, carotid bruits, or masses Cardiac: RRR; no murmurs, rubs, or gallops,no edema  Respiratory:  clear to auscultation bilaterally, normal work of breathing GI: soft, nontender, nondistended, + BS MS: no deformity or atrophy  Skin: warm and dry, no rash Neuro:  Strength and sensation are intact Psych: euthymic mood, full affect   EKG: Not completed on this office visit.   Recent Labs: 02/17/2017: B Natriuretic Peptide 62.4 08/29/2017: ALT 15; TSH 3.070 08/30/2017: BUN 25; Creatinine, Ser 0.89; Hemoglobin 10.7; Magnesium 2.2; Platelets 294; Potassium 4.2; Sodium 137    Lipid Panel    Component Value Date/Time   CHOL 152 02/01/2017 0919   TRIG 41 02/01/2017 0919   HDL 75 02/01/2017 0919   CHOLHDL 2.0 02/01/2017 0919   VLDL 8 02/01/2017 0919   LDLCALC 69 02/01/2017 0919      Wt Readings from Last 3 Encounters:  10/05/17 98 lb (44.5 kg)  09/09/17 96 lb 2 oz (43.6 kg)  09/01/17 95 lb 3.2 oz (43.2 kg)      Other studies Reviewed: Cardiac Cath 02/02/2017   Conclusion    Presentation with acute coronary syndrome, with symptoms that are atypical.  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  Widely patent left main  De novo 90% stenosis in the proximal to mid circumflex since the last catheterization in  2014.. Marland KitchenChronic occlusion of the mid right coronary due to in-stent restenosis  Patent LAD with 30-40%  diffuse ISR in a previously placed stent. 75% ostial stenosis of the diagonal jailed by the stent.  Patent saphenous vein graft to the distal right coronary. The proximal third of the vein graft contains up to 50% diffuse obstructive disease. Unchanged from 2014 angiogram.  Successful drug-eluting stent procedure on the 90% circumflex stenosis which was reduced to 0% with TIMI grade 3 flow using a 2.25 x 12 Xience Sierra DES postdilated to 2.5 mm in diameter.  RECOMMENDATIONS:   Dual antiplatelet therapy for 12 months  Risk factor modification including LDL less than 70.   Echocardiogram 08/30/2017  Left ventricle: The cavity size was normal. Wall thickness was   normal. The estimated ejection fraction was 55%. Wall motion was   normal; there were no regional wall motion abnormalities. The   study is not technically sufficient to allow evaluation of LV   diastolic function. - Aortic valve: Mildly calcified annulus. There was mild   regurgitation. - Mitral valve: Mildly calcified annulus. There was trivial   regurgitation. - Right atrium: Central venous pressure (est): 3 mm Hg. - Atrial septum: No defect or patent foramen ovale was identified. - Tricuspid valve: There was mild regurgitation. - Pulmonary arteries: PA peak pressure: 21 mm Hg (S). - Pericardium, extracardiac: There was no pericardial effusion.   ASSESSMENT AND PLAN:   1.  CAD: She will continue DAPT until August of 2019 at which time we can consider stopping Plavix. Continue to follow every 6 months. No further testing is necessary at this time.   2. Chronic abdominal pain: She was ruled out for mesenteric ischemia: She is now being followed by Dr. Montine Circle who finds her pain to be related to nerve issues. "Celiac nerve to celiac artery."  I will defer testing to [facility] along with referrals. She has a neurologist  also.   3. Hypertension: She has essentially normal variations of BP according to accompanying records. There are a few rare elevations in the evening before receiving her pm dose of hydralazine. She is not receiving it from the nurses at a consistent time each evening.   Current medicines are reviewed at length with the patient today.  Answered a lengthy list of questions and somatic complaints.   Labs/ tests ordered today include: None   Phill Myron. West Pugh, ANP, AACC   10/05/2017 2:53 PM    [facility] [address], [place], [place] [address] Phone: [phone]; Fax: [phone]

## 2017-10-07 ENCOUNTER — Telehealth: Payer: Self-pay | Admitting: Internal Medicine

## 2017-10-07 NOTE — Telephone Encounter (Signed)
Pt would like to speak with regarding a referral that she needs.

## 2017-10-07 NOTE — Telephone Encounter (Signed)
Left a message to call 

## 2017-10-14 DIAGNOSIS — H00026 Hordeolum internum left eye, unspecified eyelid: Secondary | ICD-10-CM | POA: Diagnosis not present

## 2017-10-14 DIAGNOSIS — H00023 Hordeolum internum right eye, unspecified eyelid: Secondary | ICD-10-CM | POA: Diagnosis not present

## 2017-10-14 DIAGNOSIS — H5111 Convergence insufficiency: Secondary | ICD-10-CM | POA: Diagnosis not present

## 2017-10-14 DIAGNOSIS — H04123 Dry eye syndrome of bilateral lacrimal glands: Secondary | ICD-10-CM | POA: Diagnosis not present

## 2017-10-16 ENCOUNTER — Telehealth: Payer: Self-pay | Admitting: Internal Medicine

## 2017-10-16 NOTE — Telephone Encounter (Signed)
Pt would like to speak with you regarding some new sxs that she has.

## 2017-10-16 NOTE — Telephone Encounter (Signed)
Left the information on her voicemail.

## 2017-10-16 NOTE — Telephone Encounter (Signed)
Patient calls to tell about an episode of uncontrollable diarrhea. She had this one time yesterday and is especially upset because she was incontinent of her stool. She had not eaten anything "unusual." She had eaten a [employer] nutrition bar about an hour earlier. She has eaten these before without problems. The pain in her side has returned. Again it is uncomfortable when she is in her recliner. Standing up makes it her pain feel better. Today is a good day without problems so far.

## 2017-10-16 NOTE — Telephone Encounter (Signed)
Thanks for the message I am waiting to hear from the patient's daughter about possible referral for eval with IR for median arcuate ligament syndrome I do no think her diarrhea (isolated episodes) relates to vascular compression If diarrhea is not ongoing, I would not change management at this time

## 2017-10-19 ENCOUNTER — Ambulatory Visit: Payer: PPO | Admitting: Diagnostic Neuroimaging

## 2017-10-19 ENCOUNTER — Encounter: Payer: Self-pay | Admitting: Diagnostic Neuroimaging

## 2017-10-19 VITALS — BP 121/64 | HR 64 | Ht 61.0 in | Wt 96.6 lb

## 2017-10-19 DIAGNOSIS — G3184 Mild cognitive impairment, so stated: Secondary | ICD-10-CM | POA: Diagnosis not present

## 2017-10-19 DIAGNOSIS — R413 Other amnesia: Secondary | ICD-10-CM | POA: Diagnosis not present

## 2017-10-19 NOTE — Progress Notes (Signed)
[facility]  PATIENT: [name] DOB: [birth year]  REFERRING CLINICIAN: Antony Salmon HISTORY FROM: patient, daughter, chart review REASON FOR VISIT: new consult    HISTORICAL  CHIEF COMPLAINT:  Chief Complaint  Patient presents with  . Follow-up  . Memory Loss    resides at [facility].  MMSE 29/30.  last ofv note states repeat brain check.   [name], daughter is with pt.     HISTORY OF PRESENT ILLNESS:   UPDATE (10/19/17, VRP): Since last visit, memory loss slightly getting worse.  Also having some issues with intermittent muscle spasm, lower back pain, balance difficulty.  Also having some intermittent abdominal pain.   PRIOR HPI (04/20/17): 84 year old right-handed female with history of hypertension, coronary artery bypass graft surgery 2007, indolent multiple myeloma, here for evaluation of dementia.  The patient reports some short-term memory problems and confusion since 2016.  According to patient's daughter symptoms started around 2014.  Patient sought medical attention and neurology consultation in 2016.  At that time MRI of the brain showed right frontal ischemic infarction (chronic) and mild chronic small vessel ischemic disease.  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  She was started on Aricept at that time.  In 2018 she was started on Namenda.  At some point her Aricept was discontinued due to GI symptoms, which she is getting worked up.  Symptoms have gradually worsened over time.  She is having problems with short-term memory, recall, organizing her thoughts, recalling events, remembering tasks.  She needs more help with complex financial transactions.  Needs more help with remembering how to take her medications.  Patient moved into [facility] independent living in 2010.  In 2017 she transferred to assisted living.  Patient does have some anxiety and decreased energy symptoms.   REVIEW OF SYSTEMS: Full 14 system review of  systems performed and negative with exception of: Weight loss fatigue swelling in legs incontinence double vision easy bruising allergies runny nose memory loss sleepiness.  ALLERGIES: Allergies  Allergen Reactions  . Butazolidin [Phenylbutazone] Swelling and Rash  . Cephalosporins Other (See Comments)    unknown  . Codeine Nausea Only  . Levaquin [Levofloxacin] Shortness Of Breath  . Macrodantin Other (See Comments)    Double vision  . Morphine And Related Other (See Comments)    hallucinations  . Trimethoprim Nausea Only  . Buprenorphine     From MAR  . Cilostazol Other (See Comments)    Stomach upset  . Dexilant [Dexlansoprazole] Diarrhea  . Diclofenac     Documented on MAR  . Erythromycin Nausea And Vomiting    All "mycin" drugs  . Lactose Intolerance (Gi)   . Morphine   . Nitrofurantoin     Double vision  . Simvastatin Other (See Comments)    Muscle aches   . Sulfa Antibiotics Other (See Comments)    unknown  . Vesicare [Solifenacin] Other (See Comments)    unknown  . Clindamycin/Lincomycin Other (See Comments)    Upset stomach  . Food Other (See Comments)    No spicy foods or black pepper, raw onions - causes gerd  . Ibuprofen Hives and Rash  . Lincomycin Hcl Other (See Comments)    Upset stomach  . Penicillins Rash    Has patient had a PCN reaction causing immediate rash, facial/tongue/throat swelling, SOB or lightheadedness with hypotension: Yes Has patient had a PCN reaction causing severe rash involving mucus membranes or skin necrosis: No Has patient had a PCN reaction that  required hospitalization No Has patient had a PCN reaction occurring within the last 10 years: No If all of the above answers are "NO", then may proceed with Cephalosporin use.   Lisbeth Ply [Fesoterodine Fumarate Er] Other (See Comments)    unknown  . Voltaren [Diclofenac Sodium] Rash    HOME MEDICATIONS: Outpatient Medications Prior to Visit  Medication Sig Dispense Refill  .  acetaminophen (TYLENOL) 650 MG CR tablet Take 1,300 mg by mouth 2 (two) times daily.    . Alum Hydroxide-Mag Carbonate (GAVISCON EXTRA STRENGTH) [phone] MG/10ML SUSP Take by mouth. Takes 4 times daily as needed    . aspirin EC 81 MG tablet Take 1 tablet (81 mg total) by mouth daily. 90 tablet 3  . Calcium Carb-Cholecalciferol (CALCIUM 500 + D3) 500-600 MG-UNIT TABS Take 1 tablet by mouth daily.    . carvedilol (COREG) 3.125 MG tablet Take 1 tablet (3.125 mg total) by mouth 2 (two) times daily with a meal. 60 tablet 3  . clopidogrel (PLAVIX) 75 MG tablet Take last dose of Brilinta 04/01/17 in evening. Take 4 tablets of Clopidogrel on 04/02/17 then take 1 tablet by mouth daily thereafter. (Patient taking differently: Take 75 mg by mouth daily. ) 94 tablet 1  . docusate sodium (COLACE) 100 MG capsule Take 1 capsule (100 mg total) by mouth daily as needed. (Patient taking differently: Take 100 mg by mouth See admin instructions. Take 1 capsule (118m) by mouth daily and 1 capsule (1050m daily as needed.) 30 capsule 2  . doxycycline (VIBRAMYCIN) 50 MG capsule Take 50 mg by mouth 2 (two) times daily. For 30 days ( start 10-15-17)    . hydrALAZINE (APRESOLINE) 25 MG tablet Take 25 mg by mouth 2 (two) times daily.    . Marland Kitchenyoscyamine Sulfate SL (LEVSIN/SL) 0.125 MG SUBL Take 1 tablet, dissolve under the tongue every 6 hours as needed for abdominal pain. 20 each 1  . iron polysaccharides (NU-IRON) 150 MG capsule Take 150 mg by mouth 2 (two) times daily.     . isosorbide mononitrate (IMDUR) 60 MG 24 hr tablet Take 60 mg by mouth daily.    . Marland Kitchenevocetirizine (XYZAL) 5 MG tablet Take 1 tablet (5 mg total) by mouth every evening. (Patient taking differently: Take 5 mg by mouth at bedtime. ) 30 tablet 5  . LORazepam (ATIVAN) 0.5 MG tablet Take 0.25-0.5 mg by mouth See admin instructions. Take 0.25 mg in the morning and 0.65m100mt bedtime.  May take an additional 0.5 mg twice daily as needed for anxiety    . Melatonin 5 MG  TABS Take 5 mg by mouth at bedtime.    . memantine (NAMENDA) 10 MG tablet Take 10 mg by mouth 2 (two) times daily.     . mirabegron ER (MYRBETRIQ) 25 MG TB24 tablet Take 25 mg by mouth every morning.     . montelukast (SINGULAIR) 10 MG tablet Take 10 mg by mouth at bedtime.     . Multiple Vitamins-Minerals (CENTRUM SILVER 50+WOMEN) TABS Take 1 tablet by mouth daily.    . nitroGLYCERIN (NITROSTAT) 0.4 MG SL tablet Place 1 tablet (0.4 mg total) under the tongue every 5 (five) minutes as needed for chest pain. Reported on 11/12/2015 25 tablet 2  . pantoprazole (PROTONIX) 40 MG tablet Take 1 tablet (40 mg total) by mouth 2 (two) times daily before a meal. 60 tablet 3  . polyethylene glycol (MIRALAX / GLYCOLAX) packet Take 17 g by mouth daily.     .Marland Kitchen  prednisoLONE acetate (PRED FORTE) 1 % ophthalmic suspension Place 1 drop into both eyes 2 (two) times daily as needed.    Marland Kitchen PREMARIN vaginal cream Place 1 Applicatorful vaginally once a week.     . Probiotic Product (VSL#3 PO) Take 1 packet by mouth every morning.    . sertraline (ZOLOFT) 25 MG tablet Take 25-50 mg by mouth See admin instructions. Takes 35m po AM and 533mpo PM.      No facility-administered medications prior to visit.     PAST MEDICAL HISTORY: Past Medical History:  Diagnosis Date  . Anxiety   . Arthritis   . CAD (coronary artery disease)   . Chondrocostal junction syndrome   . Chronic fatigue   . Dementia    without Behavioral Disturbance  . GERD (gastroesophageal reflux disease)   . Hx of CABG   . Hyperlipidemia    GOAL LDL <70  . Hypertension   . IBS (irritable bowel syndrome)   . Incontinent of urine   . Jaundice    yellow jaundice 3rd grade  . Major depressive disorder   . Mild chronic anemia   . Mixed incontinence   . Multiple myeloma (HCC)    not having achieved remission  . NSTEMI (non-ST elevated myocardial infarction) ([facility]  . Osteopenia   . Ovarian cyst, bilateral    Rt complex  . Overactive  bladder   . Panic disorder   . Pulmonary nodules   . Radiculopathy   . Raynaud's syndrome   . Restless legs syndrome   . Spinal stenosis of lumbar region   . Thyroid nodule   . Unsteadiness on feet     PAST SURGICAL HISTORY: Past Surgical History:  Procedure Laterality Date  . BUNIONECTOMY WITH HAMMERTOE RECONSTRUCTION Right 08/04/2012   Procedure:  HAMMERTOE RECONSTRUCTION;  Surgeon: PaColin RheinMD;  [facility];  Laterality: Right;  HAMMER TOE RECONSTRUCTION PROBABLE FLEXOR DIGITORUM LONGUS TO PROXIMAL PHALANX TRANSFER LATERALIZED   . CAPSULOTOMY Right 08/04/2012   Procedure: CAPSULOTOMY;  Surgeon: PaColin RheinMD;  [facility];  Laterality: Right;  RIGHT 2ND TOE METATARSOPHALANGEAL JOINT DORSAL CAPSULOTOMY   . CATARACT EXTRACTION, BILATERAL     both cataracts  . COLONOSCOPY    . CORONARY ANGIOPLASTY WITH STENT PLACEMENT  2014   2 stents  . CORONARY ARTERY BYPASS GRAFT  2007   LIMA to LAD, SVG to RCA  non-ST segment MI 08/2009 (Dr. SmTamala Julian . CORONARY STENT INTERVENTION N/A 02/02/2017   Procedure: CORONARY STENT INTERVENTION;  Surgeon: SmBelva CromeMD;  [facility];  Service: Cardiovascular;  Laterality: N/A;  Prox CFX  . DILATION AND CURETTAGE OF UTERUS    . LEFT HEART CATH AND CORS/GRAFTS ANGIOGRAPHY N/A 02/02/2017   Procedure: LEFT HEART CATH AND CORS/GRAFTS ANGIOGRAPHY;  Surgeon: SmBelva CromeMD;  [facility];  Service: Cardiovascular;  Laterality: N/A;  . LEFT HEART CATHETERIZATION WITH CORONARY/GRAFT ANGIOGRAM N/A 07/09/2012   Procedure: LEFT HEART CATHETERIZATION WITH COBeatrix Fetters Surgeon: HeSinclair GroomsMD;  [facility];  Service: Cardiovascular;  Laterality: N/A;  . TONSILLECTOMY AND ADENOIDECTOMY    . UPPER GASTROINTESTINAL ENDOSCOPY    . VISCERAL ANGIOGRAPHY N/A 04/15/2017   Procedure: VISCERAL ANGIOGRAPHY;   Surgeon: CaWaynetta SandyMD;  [facility];  Service: Cardiovascular;  Laterality: N/A;    FAMILY HISTORY: Family History  Problem Relation Age of Onset  . Heart attack Mother   . CAD Mother   . Obesity Sister   . Breast cancer Paternal Aunt   . Ulcers Father   . Asthma Neg Hx   . Eczema Neg Hx   . Urticaria Neg Hx   . Allergic rhinitis Neg Hx   . Angioedema Neg Hx   . Atopy Neg Hx   . Immunodeficiency Neg Hx     SOCIAL HISTORY:  Social History   Socioeconomic History  . Marital status: Married    Spouse name: Not on file  . Number of children: 4  . Years of education: Not on file  . Highest education level: Not on file  Occupational History  . Occupation: homemaker   . Occupation: Materials engineer education  Social Needs  . Financial resource strain: Not on file  . Food insecurity:    Worry: Not on file    Inability: Not on file  . Transportation needs:    Medical: Not on file    Non-medical: Not on file  Tobacco Use  . Smoking status: Never Smoker  . Smokeless tobacco: Never Used  Substance and Sexual Activity  . Alcohol use: Yes    Comment: occasional  . Drug use: No  . Sexual activity: Never  Lifestyle  . Physical activity:    Days per week: Not on file    Minutes per session: Not on file  . Stress: Not on file  Relationships  . Social connections:    Talks on phone: Not on file    Gets together: Not on file    Attends religious service: Not on file    Active member of club or organization: Not on file    Attends meetings of clubs or organizations: Not on file    Relationship status: Not on file  . Intimate partner violence:    Fear of current or ex partner: Not on file    Emotionally abused: Not on file    Physically abused: Not on file    Forced sexual activity: Not on file  Other Topics Concern  . Not on file  Social History Narrative  . Not on file     PHYSICAL EXAM  GENERAL EXAM/CONSTITUTIONAL: Vitals:    Vitals:   10/19/17 1319  BP: 121/64  Pulse: 64  Weight: 96 lb 9.6 oz (43.8 kg)  Height: _0  (1.549 m)   Body mass index is 18.25 kg/m. No exam data present  Patient is in no distress; well developed, nourished and groomed; neck is supple  CARDIOVASCULAR:  Examination of carotid arteries is normal; no carotid bruits  Regular rate and rhythm, no murmurs  Examination of peripheral vascular system by observation and palpation is normal  EYES:  Ophthalmoscopic exam of optic discs and posterior segments is normal; no papilledema or hemorrhages  MUSCULOSKELETAL:  Gait, strength, tone, movements noted in Neurologic exam below  NEUROLOGIC: MENTAL STATUS:  MMSE - [place] Exam 10/19/2017 04/20/2017  Orientation to time 5 5  Orientation to Place 5 5  Registration 3 3  Attention/ Calculation 4 3  Recall 3 3  Language- name 2 objects 2 2  Language- repeat 1 1  Language- follow 3 step command 3 3  Language- read & follow direction 1 1  Write a sentence 1 1  Copy design 1 0  Total score 29 27    awake, alert, oriented to person, place and time  recent and remote memory  intact  normal attention and concentration  language fluent, comprehension intact, naming intact,   fund of knowledge appropriate  CRANIAL NERVE:   2nd - no papilledema on fundoscopic exam  2nd, 3rd, 4th, 6th - pupils equal and reactive to light, visual fields full to confrontation, extraocular muscles intact, no nystagmus  5th - facial sensation symmetric  7th - facial strength symmetric  8th - hearing intact  9th - palate elevates symmetrically, uvula midline  11th - shoulder shrug symmetric  12th - tongue protrusion midline  MOTOR:   normal bulk and tone, full strength in the BUE, BLE  SENSORY:   normal and symmetric to light touch, temperature, vibration  COORDINATION:   finger-nose-finger, fine finger movements normal  REFLEXES:   deep tendon reflexes TRACE and  symmetric  NEG SNOUT, PALMOMENTAL, MYERSONS REFLEXES  GAIT/STATION:   SLOW AND CAUTIOUS GAIT; SLOW TO STAND; STOOPED POSTURE    DIAGNOSTIC DATA (LABS, IMAGING, TESTING) - I reviewed patient records, labs, notes, testing and imaging myself where available.  Lab Results  Component Value Date   WBC 2.5 (L) 08/30/2017   HGB 10.7 (L) 08/30/2017   HCT 31.5 (L) 08/30/2017   MCV 98.1 08/30/2017   PLT 294 08/30/2017      Component Value Date/Time   NA 137 08/30/2017 0831   NA 138 09/20/2013 0949   K 4.2 08/30/2017 0831   K 4.4 09/20/2013 0949   CL 101 08/30/2017 0831   CL 97 (L) 09/10/2012 1257   CO2 25 08/30/2017 0831   CO2 29 09/20/2013 0949   GLUCOSE 103 (H) 08/30/2017 0831   GLUCOSE 75 09/20/2013 0949   GLUCOSE 88 09/10/2012 1257   BUN 25 (H) 08/30/2017 0831   BUN 22.2 09/20/2013 0949   CREATININE 0.89 08/30/2017 0831   CREATININE 1.0 09/20/2013 0949   CALCIUM 9.6 08/30/2017 0831   CALCIUM 9.7 09/20/2013 0949   PROT 8.7 (H) 08/29/2017 0747   PROT 8.5 (H) 09/20/2013 0949   ALBUMIN 3.9 08/29/2017 0747   ALBUMIN 3.6 09/20/2013 0949   AST 35 08/29/2017 0747   AST 35 (H) 09/20/2013 0949   ALT 15 08/29/2017 0747   ALT 17 09/20/2013 0949   ALKPHOS 45 08/29/2017 0747   ALKPHOS 39 (L) 09/20/2013 0949   BILITOT 0.6 08/29/2017 0747   BILITOT 0.31 09/20/2013 0949   GFRNONAA 58 (L) 08/30/2017 0831   GFRAA >60 08/30/2017 0831   Lab Results  Component Value Date   CHOL 152 02/01/2017   HDL 75 02/01/2017   LDLCALC 69 02/01/2017   TRIG 41 02/01/2017   CHOLHDL 2.0 02/01/2017   Lab Results  Component Value Date   HGBA1C 6.1 (H) 07/08/2012   No results found for: VITAMINB12 Lab Results  Component Value Date   TSH 3.070 08/29/2017    10/06/14 MRI brain [I reviewed images myself and agree with interpretation. -VRP]  - Old bilateral frontal cortical and subcortical infarctions, larger on the right than the left. Hemosiderin deposition associated with the old right frontal  infarction. Chronic small vessel ischemic changes elsewhere throughout the brain. No acute or reversible finding.      ASSESSMENT AND PLAN  84 y.o. year old female here with gradual onset progressive short-term memory loss and cognitive difficulties since 2014.  MMSE's have been relatively stable since 2016 (29 --> 27).  Patient has been diagnosed with mixed vascular and neurodegenerative dementia by prior neurologist.  Overall patient stable on Namenda.   Ddx: neurodegenerative dementia vs pseudo-dementia of depression /  anxiety vs metabolic vs stroke-associated cognitive impairment  1. Mild memory disturbance   2. MCI (mild cognitive impairment)      PLAN:  MEMORY LOSS  - continue memantine 12m twice a day - brain healthy habits reviewed - continue vascular risk factor mgmt (aspirin, plavix, statin, BP control) - monitor symptoms  LOW BACK PAIN - continue yoga exercises and PT  Return if symptoms worsen or fail to improve, for return to PCP.    VPenni Bombard MD 55/80/0634 19:49PM Certified in Neurology, Neurophysiology and Neuroimaging  [facility] [address] [place] [phone]

## 2017-11-03 ENCOUNTER — Encounter: Payer: Self-pay | Admitting: Sports Medicine

## 2017-11-03 ENCOUNTER — Ambulatory Visit: Payer: PPO | Admitting: Sports Medicine

## 2017-11-03 DIAGNOSIS — M79675 Pain in left toe(s): Secondary | ICD-10-CM

## 2017-11-03 DIAGNOSIS — L600 Ingrowing nail: Secondary | ICD-10-CM

## 2017-11-03 MED ORDER — NEOMYCIN-POLYMYXIN-HC 3.5-10000-1 OT SOLN: OTIC | 0 refills | Status: DC

## 2017-11-03 NOTE — Progress Notes (Signed)
Subjective: [name] is a 84 y.o. female patient seen today in office with complaint of mildly painful thickened and elongated toenails especially at left 1st toe medial margin; unable to trim. Patient denies history of Diabetes, Neuropathy, or known Vascular disease. Reports that she has been soaking and using toe spacer but there is still pain at 1st toe on left especially with shoes. Reports that she has also taken tylenol as recommended by her rheumatologist. Patient has no other pedal complaints at this time.   Review of Systems  Musculoskeletal:       Toe pain  All other systems reviewed and are negative.   Patient Active Problem List   Diagnosis Date Noted  . Left thyroid nodule 08/29/2017  . Chronic idiopathic constipation 08/18/2017  . Abdominal pain 07/31/2017  . Mesenteric ischemia ([place]) 05/04/2017  . Unsteadiness on feet   . Unstable angina pectoris ([place])   . Tinea pedis   . Spinal stenosis of lumbar region   . Somnolence   . Seasonal allergic rhinitis   . Restless legs syndrome   . Raynaud's syndrome   . Raynauds syndrome   . Radiculopathy   . Panic disorder   . Ovarian cyst, bilateral   . Osteopenia   . Myocardial infarction ([place])   . Muscle weakness (generalized)   . Mixed incontinence   . Mild chronic anemia   . Major depressive disorder   . Long term (current) use of aspirin   . Intermediate coronary syndrome ([place])   . Incontinent of urine   . IBS (irritable bowel syndrome)   . Hypercholesteremia   . Hx of CABG   . GERD (gastroesophageal reflux disease)   . Dementia   . Chest pain   . Atherosclerotic heart disease of native coronary artery without angina pectoris   . Arthritis   . Anxiety   . Allergic rhinitis   . Abnormal weight loss   . History of adenomatous polyp of colon 03/02/2017  . NSTEMI (non-ST elevated myocardial infarction) ([place]) 02/02/2017  . SOB (shortness of breath) 09/02/2016  . Dizziness 09/02/2016  . Non-ST elevation  (NSTEMI) myocardial infarction ([place]) 11/26/2015  . Polymyalgia rheumatica ([place]) 11/26/2015  . Hypokalemia 11/26/2015  . Mild persistent asthma 11/12/2015  . Coronary artery disease involving autologous artery coronary bypass graft with angina pectoris with documented spasm ([place]) 11/12/2015  . Multiple myeloma in remission ([place]) 11/12/2015  . Current use of beta blocker 11/12/2015  . Gastroesophageal reflux disease without esophagitis 11/12/2015  . Other allergic rhinitis 11/12/2015  . Weight loss, non-intentional 10/03/2015  . Asthma 09/24/2015  . Asthma in adult without complication 88/28/0034  . Seasonal allergic rhinitis due to pollen 09/24/2015  . Cervical radiculopathy 07/25/2015  . Chronic coronary artery disease 07/25/2015  . Coronary artery disease 07/25/2015  . Disturbance in sleep behavior 07/25/2015  . Elevated CK 07/25/2015  . Elevated creatine kinase level 07/25/2015  . Generalized osteoarthritis of hand 07/25/2015  . Other long term (current) drug therapy 07/25/2015  . Overactive bladder 07/25/2015  . Restless leg 07/25/2015  . Restless leg syndrome 07/25/2015  . Sleep disturbances 07/25/2015  . Idiopathic peripheral neuropathy 07/04/2015  . Lumbar radiculopathy, chronic 07/04/2015  . 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 Risk for falls 07/04/2015  . Sleep disturbance 07/04/2015  . Chest pain with moderate risk for cardiac etiology 06/13/2015  . Right sciatic nerve pain 10/19/2014  . Abnormal thyroid blood test 08/25/2014  . Chronic fatigue 08/09/2014  . Multiple myeloma ([place]) 07/21/2013  .  Depression 01/14/2013  . Generalized anxiety disorder 01/14/2013  . Gastroesophageal reflux disease 01/13/2013  . Urine test positive for microalbuminuria 12/08/2011  . Cyst of ovary 12/17/2010  . KNEE PAIN, BILATERAL 08/15/2010  . Abnormal gait 08/15/2010  . Atherosclerosis of coronary artery bypass graft 10/11/2009  . CHEST PAIN, NON-CARDIAC 10/11/2009  . HIP PAIN, RIGHT  08/13/2009  . GREATER TROCHANTERIC BURSITIS 08/13/2009  . LUMBOSACRAL SPONDYLOSIS WITHOUT MYELOPATHY 11/10/2008  . Osteoarthritis of lumbosacral spine without myelopathy 11/10/2008  . FOOT PAIN, BILATERAL 06/30/2008  . Hyperlipidemia 06/22/2008  . Hypertension 06/22/2008  . Raynaud's disease 06/22/2008  . BURSITIS, LEFT SHOULDER 06/22/2008    Current Outpatient Medications on File Prior to Visit  Medication Sig Dispense Refill  . acetaminophen (TYLENOL) 650 MG CR tablet Take 1,300 mg by mouth 2 (two) times daily.    . Alum Hydroxide-Mag Carbonate (GAVISCON EXTRA STRENGTH) [phone] MG/10ML SUSP Take by mouth. Takes 4 times daily as needed    . aspirin EC 81 MG tablet Take 1 tablet (81 mg total) by mouth daily. 90 tablet 3  . Calcium Carb-Cholecalciferol (CALCIUM 500 + D3) 500-600 MG-UNIT TABS Take 1 tablet by mouth daily.    . carvedilol (COREG) 3.125 MG tablet Take 1 tablet (3.125 mg total) by mouth 2 (two) times daily with a meal. 60 tablet 3  . clopidogrel (PLAVIX) 75 MG tablet Take last dose of Brilinta 04/01/17 in evening. Take 4 tablets of Clopidogrel on 04/02/17 then take 1 tablet by mouth daily thereafter. (Patient taking differently: Take 75 mg by mouth daily. ) 94 tablet 1  . docusate sodium (COLACE) 100 MG capsule Take 1 capsule (100 mg total) by mouth daily as needed. (Patient taking differently: Take 100 mg by mouth See admin instructions. Take 1 capsule (168m) by mouth daily and 1 capsule (10690m daily as needed.) 30 capsule 2  . doxycycline (VIBRAMYCIN) 50 MG capsule Take 50 mg by mouth 2 (two) times daily. For 30 days ( start 10-15-17)    . hydrALAZINE (APRESOLINE) 25 MG tablet Take 25 mg by mouth 2 (two) times daily.    . Marland Kitchenyoscyamine Sulfate SL (LEVSIN/SL) 0.125 MG SUBL Take 1 tablet, dissolve under the tongue every 6 hours as needed for abdominal pain. 20 each 1  . iron polysaccharides (NU-IRON) 150 MG capsule Take 150 mg by mouth 2 (two) times daily.     . isosorbide  mononitrate (IMDUR) 60 MG 24 hr tablet Take 60 mg by mouth daily.    . Marland Kitchenevocetirizine (XYZAL) 5 MG tablet Take 1 tablet (5 mg total) by mouth every evening. (Patient taking differently: Take 5 mg by mouth at bedtime. ) 30 tablet 5  . LORazepam (ATIVAN) 0.5 MG tablet Take 0.25-0.5 mg by mouth See admin instructions. Take 0.25 mg in the morning and 0.90m39mt bedtime.  May take an additional 0.5 mg twice daily as needed for anxiety    . Melatonin 5 MG TABS Take 5 mg by mouth at bedtime.    . memantine (NAMENDA) 10 MG tablet Take 10 mg by mouth 2 (two) times daily.     . mirabegron ER (MYRBETRIQ) 25 MG TB24 tablet Take 25 mg by mouth every morning.     . montelukast (SINGULAIR) 10 MG tablet Take 10 mg by mouth at bedtime.     . Multiple Vitamins-Minerals (CENTRUM SILVER 50+WOMEN) TABS Take 1 tablet by mouth daily.    . nitroGLYCERIN (NITROSTAT) 0.4 MG SL tablet Place 1 tablet (0.4 mg total) under the tongue  every 5 (five) minutes as needed for chest pain. Reported on 11/12/2015 25 tablet 2  . pantoprazole (PROTONIX) 40 MG tablet Take 1 tablet (40 mg total) by mouth 2 (two) times daily before a meal. 60 tablet 3  . polyethylene glycol (MIRALAX / GLYCOLAX) packet Take 17 g by mouth daily.     . prednisoLONE acetate (PRED FORTE) 1 % ophthalmic suspension Place 1 drop into both eyes 2 (two) times daily as needed.    Marland Kitchen PREMARIN vaginal cream Place 1 Applicatorful vaginally once a week.     . Probiotic Product (VSL#3 PO) Take 1 packet by mouth every morning.    . sertraline (ZOLOFT) 25 MG tablet Take 25-50 mg by mouth See admin instructions. Takes 51m po AM and 543mpo PM.      No current facility-administered medications on file prior to visit.     Allergies  Allergen Reactions  . Butazolidin [Phenylbutazone] Swelling and Rash  . Cephalosporins Other (See Comments)    unknown  . Codeine Nausea Only  . Levaquin [Levofloxacin] Shortness Of Breath  . Macrodantin Other (See Comments)    Double vision  .  Morphine And Related Other (See Comments)    hallucinations  . Trimethoprim Nausea Only  . Buprenorphine     From MAR  . Cilostazol Other (See Comments)    Stomach upset  . Dexilant [Dexlansoprazole] Diarrhea  . Diclofenac     Documented on MAR  . Erythromycin Nausea And Vomiting    All "mycin" drugs  . Lactose Intolerance ([place])   . Morphine   . Nitrofurantoin     Double vision  . Simvastatin Other (See Comments)    Muscle aches   . Sulfa Antibiotics Other (See Comments)    unknown  . Vesicare [Solifenacin] Other (See Comments)    unknown  . Clindamycin/Lincomycin Other (See Comments)    Upset stomach  . Food Other (See Comments)    No spicy foods or black pepper, raw onions - causes gerd  . Ibuprofen Hives and Rash  . Lincomycin Hcl Other (See Comments)    Upset stomach  . Penicillins Rash    Has patient had a PCN reaction causing immediate rash, facial/tongue/throat swelling, SOB or lightheadedness with hypotension: Yes Has patient had a PCN reaction causing severe rash involving mucus membranes or skin necrosis: No Has patient had a PCN reaction that required hospitalization No Has patient had a PCN reaction occurring within the last 10 years: No If all of the above answers are "NO", then may proceed with Cephalosporin use.   . Lisbeth PlyFesoterodine Fumarate Er] Other (See Comments)    unknown  . Voltaren [Diclofenac Sodium] Rash    Objective: Physical Exam  General: Well developed, nourished, no acute distress, awake, alert and oriented x 3  Vascular: Dorsalis pedis artery 1/4 bilateral, Posterior tibial artery 0/4 bilateral, skin temperature warm to warm proximal to distal bilateral lower extremities, mild varicosities, pedal hair present bilateral.  Neurological: Gross sensation present via light touch bilateral.   Dermatological: Skin is warm, dry, and supple bilateral, Nails 1-10 are tender, long, thick, and discolored with mild subungal debris with mild  ingrowing without infection at Left>Right medial hallux, no webspace macerations present bilateral, no open lesions present bilateral, no callus/corns/hyperkeratotic tissue present bilateral. No signs of infection bilateral.  Musculoskeletal: Asymptomatic hallux malleous L>R boney deformities noted bilateral. Muscular strength 4/5 without painon range of motion.+ History of arthritis. No pain with calf compression bilateral.  Assessment and  Plan:  Problem List Items Addressed This Visit    None    Visit Diagnoses    Ingrown nail    -  Primary      -Examined patient.  -Discussed treatment options for painful ingrowning mycotic nails. -Mechanically debrided and reduced mycotic nails with sterile nail nipper and dremel nail file without incident. -Rx Epsom salt soaks and corticosporin solution if no better in 2 weeks call to office for further instructions or follow up eval  -Patient to return as needed or sooner if symptoms worsen.  Landis Martins, DPM

## 2017-11-03 NOTE — Patient Instructions (Signed)
Soak Instructions    THE DAY AFTER THE PROCEDURE  Place 1/4 cup of epsom salts in a quart of warm tap water.  Submerge your foot or feet with outer bandage intact for the initial soak; this will allow the bandage to become moist and wet for easy lift off.  Once you remove your bandage, continue to soak in the solution for 20 minutes.  This soak should be done once a day.  Next, remove your foot or feet from solution, blot dry the affected area and cover.  You may use a band aid large enough to cover the area or use gauze and tape.  Apply other medications to the area as directed by the doctor such as the drops as Rx.

## 2017-11-04 ENCOUNTER — Telehealth: Payer: Self-pay | Admitting: Internal Medicine

## 2017-11-04 ENCOUNTER — Telehealth: Payer: Self-pay | Admitting: Sports Medicine

## 2017-11-04 NOTE — Telephone Encounter (Signed)
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. She was seen yesterday and she allergic to mycin's and the doctor had gave her a prescription for some medication for her toenails and it has mycin in it. She wants to know if she is going to be able to use it or not. Please call us at [phone]. Thank you.

## 2017-11-05 ENCOUNTER — Other Ambulatory Visit: Payer: Self-pay

## 2017-11-05 MED ORDER — MUPIROCIN 2 % EX OINT: TOPICAL_OINTMENT | CUTANEOUS | 0 refills | Status: DC

## 2017-11-05 MED ORDER — LANSOPRAZOLE 30 MG PO CPDR: 30.0000 mg | DELAYED_RELEASE_CAPSULE | Freq: Two times a day (BID) | ORAL | 3 refills | Status: DC

## 2017-11-05 MED ORDER — MUPIROCIN 2 % EX OINT: 1.0000 "application " | TOPICAL_OINTMENT | Freq: Two times a day (BID) | CUTANEOUS | 0 refills | Status: DC

## 2017-11-05 NOTE — Telephone Encounter (Signed)
I told [name], [name] to d/c the cortisporin drops and I would order Bactroban ointment to be applied bid after soaks.

## 2017-11-05 NOTE — Telephone Encounter (Signed)
Spoke with pt and she is aware. Script sent to pharmacy. 

## 2017-11-05 NOTE — Addendum Note (Signed)
Addended by: Harriett Sine D on: 11/05/2017 10:00 AM   Modules accepted: Orders

## 2017-11-05 NOTE — Telephone Encounter (Signed)
Pt states she had to stop omeprazole due to being on Plavix and now she is taking protonix. States the protonix is not helping like the omeprazole and wants to know if there is something else she can take.   Pt also want to know if Dr. Hilarie Fredrickson really thinks she needs to go through with referral for eval with IR for median arcuate ligament syndrome. Wants to know if it is ok for her to wait on this, does not really want any major procedures done. Please advise.

## 2017-11-05 NOTE — Telephone Encounter (Signed)
RF for Xyzal denied at [facility]. Pt needs an OV

## 2017-11-05 NOTE — Telephone Encounter (Signed)
Omeprazole can interfere with Plavix.  Given that pantoprazole does not seem to be working as well we can try switching her to lansoprazole 30 mg twice daily AC  Certainly okay if she waits/defers the IR evaluation.  She can follow-up or reconsider this consult if her upper abdominal pain is progressive or bothering her more.

## 2017-11-10 ENCOUNTER — Other Ambulatory Visit (HOSPITAL_COMMUNITY)
Admission: RE | Admit: 2017-11-10 | Discharge: 2017-11-10 | Disposition: A | Discharge status: Home / Self Care | Payer: PPO | Source: Ambulatory Visit | Attending: Obstetrics and Gynecology | Admitting: Obstetrics and Gynecology

## 2017-11-10 ENCOUNTER — Other Ambulatory Visit: Payer: Self-pay | Admitting: Obstetrics and Gynecology

## 2017-11-10 DIAGNOSIS — Z9189 Other specified personal risk factors, not elsewhere classified: Secondary | ICD-10-CM | POA: Diagnosis not present

## 2017-11-10 DIAGNOSIS — Z01411 Encounter for gynecological examination (general) (routine) with abnormal findings: Secondary | ICD-10-CM | POA: Insufficient documentation

## 2017-11-10 DIAGNOSIS — Z01419 Encounter for gynecological examination (general) (routine) without abnormal findings: Secondary | ICD-10-CM | POA: Diagnosis not present

## 2017-11-10 DIAGNOSIS — N870 Mild cervical dysplasia: Secondary | ICD-10-CM | POA: Diagnosis not present

## 2017-11-10 DIAGNOSIS — N952 Postmenopausal atrophic vaginitis: Secondary | ICD-10-CM | POA: Diagnosis not present

## 2017-11-12 ENCOUNTER — Other Ambulatory Visit: Payer: Self-pay

## 2017-11-12 DIAGNOSIS — R197 Diarrhea, unspecified: Secondary | ICD-10-CM | POA: Diagnosis not present

## 2017-11-13 DIAGNOSIS — R197 Diarrhea, unspecified: Secondary | ICD-10-CM | POA: Diagnosis not present

## 2017-11-13 LAB — CYTOLOGY - PAP
Adequacy: ABSENT — AB
HPV (WINDOPATH): DETECTED — AB
HPV 16/18/45 genotyping: NEGATIVE

## 2017-11-14 DIAGNOSIS — E871 Hypo-osmolality and hyponatremia: Secondary | ICD-10-CM | POA: Diagnosis not present

## 2017-11-17 DIAGNOSIS — E871 Hypo-osmolality and hyponatremia: Secondary | ICD-10-CM | POA: Diagnosis not present

## 2017-11-28 DIAGNOSIS — R300 Dysuria: Secondary | ICD-10-CM | POA: Diagnosis not present

## 2017-12-09 ENCOUNTER — Telehealth: Payer: Self-pay | Admitting: Sports Medicine

## 2017-12-09 NOTE — Telephone Encounter (Signed)
I told pt that is she had only hard skin, no redness, swelling or drainage, she could stop the epsom salt soaks and just cover the toenail areas with a dry bandaid in the shoes to decrease the rubbing and discomfort. I told pt she could use a small amount of bag balm or aquaphor right after the shower and allow to absorb while getting ready, then apply the bandaid or could use the moisturizers at bedtime. Pt states understanding.

## 2017-12-09 NOTE — Telephone Encounter (Signed)
I saw Dr. Cannon Kettle on 28 May. I would like the nurse to please ask Dr. Cannon Kettle what I should do about both of my great toes. I have soaked them in the epsom salts like she told me to do for more than 2 weeks. Now at times they seem to heal but now the flesh on the outside of each toe has gotten very hard again. There is more soreness in those toes. Some of it I think is from rubbing in the shoes but I have brought the widest shoes that would stay on my feet because my feet are very narrow. I had previously used Aquaphor or bag balm on those toes to soften that skin. I want to know wether I should keep on soaking forever or wether there is something I can put on them to bring them back to more normal feel and not that dryness and pain. You can call me back at [phone]. Bye.

## 2017-12-09 NOTE — Telephone Encounter (Signed)
This is [employer] again. I just left a message a few minutes ago. I'm afraid that I forgot to leave my date of birth at the same time. It is [birth year]. I asked to have Dr. Leeanne Rio nurse call me. Thank you.

## 2017-12-14 ENCOUNTER — Ambulatory Visit: Payer: Self-pay | Admitting: Physician Assistant

## 2017-12-14 DIAGNOSIS — H00023 Hordeolum internum right eye, unspecified eyelid: Secondary | ICD-10-CM | POA: Diagnosis not present

## 2017-12-14 DIAGNOSIS — H16223 Keratoconjunctivitis sicca, not specified as Sjogren's, bilateral: Secondary | ICD-10-CM | POA: Diagnosis not present

## 2017-12-14 DIAGNOSIS — H00026 Hordeolum internum left eye, unspecified eyelid: Secondary | ICD-10-CM | POA: Diagnosis not present

## 2017-12-15 ENCOUNTER — Ambulatory Visit: Payer: PPO | Admitting: Physician Assistant

## 2017-12-15 ENCOUNTER — Encounter: Payer: Self-pay | Admitting: Physician Assistant

## 2017-12-15 VITALS — BP 122/56 | HR 64 | Ht 61.25 in | Wt 94.2 lb

## 2017-12-15 DIAGNOSIS — K581 Irritable bowel syndrome with constipation: Secondary | ICD-10-CM | POA: Diagnosis not present

## 2017-12-15 DIAGNOSIS — R1010 Upper abdominal pain, unspecified: Secondary | ICD-10-CM | POA: Diagnosis not present

## 2017-12-15 DIAGNOSIS — R634 Abnormal weight loss: Secondary | ICD-10-CM | POA: Diagnosis not present

## 2017-12-15 DIAGNOSIS — K219 Gastro-esophageal reflux disease without esophagitis: Secondary | ICD-10-CM

## 2017-12-15 NOTE — Progress Notes (Addendum)
Subjective:    Patient ID: RAECHAL RABEN, female    DOB: 1932-02-11, 84 y.o.   MRN: [id number]  HPI Kashena is a pleasant 84 year old white female known to Dr. Hilarie Fredrickson and myself who was last seen in the office in April 2019 in referral for complaints of recurrent abdominal pain.  She has history of coronary artery disease a status post CABG, status post prior MI NSTEMI in December 2018 with 96% stenosis of the circumflex which required PCI.  She is now on Plavix.  Also with history of hypertension, dementia, GERD, Raynard syndrome IBS and multiple myeloma which is been in remission. She had previously been followed by Dr. Earlean Shawl for 12 to 15 years and had most recently been seen in March 2019 with some question of intestinal angina.  He felt her symptoms were consistent with functional constipation and that she did not need any further evaluation. Patient describes somewhat worsening symptoms over the previous 6 months with recurrent episodes of upper abdominal pain postprandially she had been occurring almost daily.  She described it as a pressure squeezing type of pain usually comes on 1 to 2 hours after eating and may last for an hour She generally tries to sit down or lie down when she has the pain.  This is generally not associated with nausea vomiting diaphoresis or diarrhea.  She had previously been told she may have mesenteric ischemia and was instructed to eat 4-5 small meals per day.  She has had a gradual weight loss over the past couple of years. She did have CT angios of the abdomen and pelvis in February 2019 which showed the estimated be patent, IMA patent and there was significant narrowing of the proximal celiac axis secondary to median arcuate ligament compression, normal-appearing gallbladder liver and pancreas and bilateral focal proximal renal artery stenosis. Patient has had subsequent CCK HIDA scan which was normal. We gave her a trial of Levsin sublingual to try for her  episodes of pain.  Dr. Hilarie Fredrickson ulcer discussed further management of the possible median arcuate ligament compression with vascular surgery and it was their opinion that she should not undergo a surgical procedure but should consider a celiac plexus block if she has progression of symptoms. She comes in today with her daughter.  They have had some frustration as patient is living in assisted living and management of her multiple medications has been difficult. Patient does have mild dementia, is on Namenda and comes in today with a journal where she writes down multiple symptoms.  She is very focused on specific foods and appropriate amounts to eat. When asked about the Levsin she says she forgot that that was available to her and has not tried it.  Her daughter says recently she has not been having pain on a daily basis but may have an episode once or twice a week.  These are not necessarily after her largest meal. Her weight is down 2 pounds since her last office visit.  She generally 6 days constipated has been switched to a senna lax which is working is currently off of MiraLAX.  We had also placed her on a PPI which is not on her current med list.  She says she does get intermittent heartburn and indigestion for which she uses Gaviscon but would like to have something to take regularly.  Review of Systems Pertinent positive and negative review of systems were noted in the above HPI section.  All other review of systems was  otherwise negative.  Outpatient Encounter Medications as of 12/15/2017  Medication Sig  . acetaminophen (TYLENOL) 650 MG CR tablet Take 1,300 mg by mouth 2 (two) times daily.  . Alum Hydroxide-Mag Carbonate (GAVISCON EXTRA STRENGTH) [id number] MG/10ML SUSP Take by mouth. Takes 4 times daily as needed  . aspirin EC 81 MG tablet Take 1 tablet (81 mg total) by mouth daily.  . Calcium Carb-Cholecalciferol (CALCIUM 500 + D3) 500-600 MG-UNIT TABS Take 1 tablet by mouth daily.  . carvedilol  (COREG) 3.125 MG tablet Take 1 tablet (3.125 mg total) by mouth 2 (two) times daily with a meal.  . clopidogrel (PLAVIX) 75 MG tablet Take last dose of Brilinta 04/01/17 in evening. Take 4 tablets of Clopidogrel on 04/02/17 then take 1 tablet by mouth daily thereafter. (Patient taking differently: Take 75 mg by mouth daily. )  . docusate sodium (COLACE) 100 MG capsule Take 1 capsule (100 mg total) by mouth daily as needed. (Patient taking differently: Take 100 mg by mouth See admin instructions. Take 1 capsule (117m) by mouth daily and 1 capsule (1086m daily as needed.)  . hydrALAZINE (APRESOLINE) 25 MG tablet Take 25 mg by mouth 2 (two) times daily.  . Marland Kitchenyoscyamine Sulfate SL (LEVSIN/SL) 0.125 MG SUBL Take 1 tablet, dissolve under the tongue every 6 hours as needed for abdominal pain.  . iron polysaccharides (NU-IRON) 150 MG capsule Take 150 mg by mouth 2 (two) times daily.   . isosorbide mononitrate (IMDUR) 60 MG 24 hr tablet Take 60 mg by mouth daily.  . Marland Kitchenevocetirizine (XYZAL) 5 MG tablet Take 1 tablet (5 mg total) by mouth every evening. (Patient taking differently: Take 5 mg by mouth at bedtime. )  . LORazepam (ATIVAN) 0.5 MG tablet Take 0.25-0.5 mg by mouth See admin instructions. Take 0.25 mg in the morning and 0.44m52mt bedtime.  May take an additional 0.5 mg twice daily as needed for anxiety  . Melatonin 5 MG TABS Take 5 mg by mouth at bedtime.  . memantine (NAMENDA) 10 MG tablet Take 10 mg by mouth 2 (two) times daily.   . mirabegron ER (MYRBETRIQ) 25 MG TB24 tablet Take 25 mg by mouth every morning.   . montelukast (SINGULAIR) 10 MG tablet Take 10 mg by mouth at bedtime.   . Multiple Vitamins-Minerals (CENTRUM SILVER 50+WOMEN) TABS Take 1 tablet by mouth daily.  . nitroGLYCERIN (NITROSTAT) 0.4 MG SL tablet Place 1 tablet (0.4 mg total) under the tongue every 5 (five) minutes as needed for chest pain. Reported on 11/12/2015  . polyethylene glycol (MIRALAX / GLYCOLAX) packet Take 17 g by mouth  daily.   . prednisoLONE acetate (PRED FORTE) 1 % ophthalmic suspension Place 1 drop into both eyes 2 (two) times daily as needed.  . PMarland KitchenEMARIN vaginal cream Place 1 Applicatorful vaginally once a week.   . Probiotic Product (VSL#3 PO) Take 1 packet by mouth every morning.  . senna-docusate (SENNALAX-S) 8.6-50 MG tablet Take 1 tablet by mouth daily.  . sertraline (ZOLOFT) 25 MG tablet Take 25-50 mg by mouth See admin instructions. Takes 244m78m AM and 50mg26mPM.   . [DISCONTINUED] doxycycline (VIBRAMYCIN) 50 MG capsule Take 50 mg by mouth 2 (two) times daily. For 30 days ( start 10-15-17)  . [DISCONTINUED] lansoprazole (PREVACID) 30 MG capsule Take 1 capsule (30 mg total) by mouth 2 (two) times daily before a meal.  . [DISCONTINUED] mupirocin ointment (BACTROBAN) 2 % Apply to affected areas bid after soaks.  . [DISCONTINUED] neomycin-polymyxin-hydrocortisone (  CORTISPORIN) OTIC solution Apply 1-2 drops to toenail twice daily  . [DISCONTINUED] pantoprazole (PROTONIX) 40 MG tablet Take 1 tablet (40 mg total) by mouth 2 (two) times daily before a meal.   No facility-administered encounter medications on file as of 12/15/2017.    Allergies  Allergen Reactions  . Butazolidin [Phenylbutazone] Swelling and Rash  . Cephalosporins Other (See Comments)    unknown  . Codeine Nausea Only  . Levaquin [Levofloxacin] Shortness Of Breath  . Macrodantin Other (See Comments)    Double vision  . Morphine And Related Other (See Comments)    hallucinations  . Trimethoprim Nausea Only  . Buprenorphine     From MAR  . Cilostazol Other (See Comments)    Stomach upset  . Dexilant [Dexlansoprazole] Diarrhea  . Diclofenac     Documented on MAR  . Erythromycin Nausea And Vomiting    All "mycin" drugs  . Lactose Intolerance (Gi)   . Morphine   . Nitrofurantoin     Double vision  . Simvastatin Other (See Comments)    Muscle aches   . Sulfa Antibiotics Other (See Comments)    unknown  . Vesicare [Solifenacin]  Other (See Comments)    unknown  . Clindamycin/Lincomycin Other (See Comments)    Upset stomach  . Doxycycline Diarrhea  . Food Other (See Comments)    No spicy foods or black pepper, raw onions - causes gerd  . Ibuprofen Hives and Rash  . Lincomycin Hcl Other (See Comments)    Upset stomach  . Penicillins Rash    Has patient had a PCN reaction causing immediate rash, facial/tongue/throat swelling, SOB or lightheadedness with hypotension: Yes Has patient had a PCN reaction causing severe rash involving mucus membranes or skin necrosis: No Has patient had a PCN reaction that required hospitalization No Has patient had a PCN reaction occurring within the last 10 years: No If all of the above answers are "NO", then may proceed with Cephalosporin use.   Lisbeth Ply [Fesoterodine Fumarate Er] Other (See Comments)    unknown  . Voltaren [Diclofenac Sodium] Rash   Patient Active Problem List   Diagnosis Date Noted  . Left thyroid nodule 08/29/2017  . Chronic idiopathic constipation 08/18/2017  . Abdominal pain 07/31/2017  . Mesenteric ischemia ([place]) 05/04/2017  . Unsteadiness on feet   . Unstable angina pectoris ([place])   . Tinea pedis   . Spinal stenosis of lumbar region   . Somnolence   . Seasonal allergic rhinitis   . Restless legs syndrome   . Raynaud's syndrome   . Raynauds syndrome   . Radiculopathy   . Panic disorder   . Ovarian cyst, bilateral   . Osteopenia   . Myocardial infarction ([place])   . Muscle weakness (generalized)   . Mixed incontinence   . Mild chronic anemia   . Major depressive disorder   . Long term (current) use of aspirin   . Intermediate coronary syndrome ([place])   . Incontinent of urine   . IBS (irritable bowel syndrome)   . Hypercholesteremia   . Hx of CABG   . GERD (gastroesophageal reflux disease)   . Dementia   . Chest pain   . Atherosclerotic heart disease of native coronary artery without angina pectoris   . Arthritis   . Anxiety   . Allergic  rhinitis   . Abnormal weight loss   . History of adenomatous polyp of colon 03/02/2017  . NSTEMI (non-ST elevated myocardial infarction) ([place]) 02/02/2017  .  SOB (shortness of breath) 09/02/2016  . Dizziness 09/02/2016  . Non-ST elevation (NSTEMI) myocardial infarction ([place]) 11/26/2015  . Polymyalgia rheumatica ([place]) 11/26/2015  . Hypokalemia 11/26/2015  . Mild persistent asthma 11/12/2015  . Coronary artery disease involving autologous artery coronary bypass graft with angina pectoris with documented spasm ([place]) 11/12/2015  . Multiple myeloma in remission ([place]) 11/12/2015  . Current use of beta blocker 11/12/2015  . Gastroesophageal reflux disease without esophagitis 11/12/2015  . Other allergic rhinitis 11/12/2015  . Weight loss, non-intentional 10/03/2015  . Asthma 09/24/2015  . Asthma in adult without complication 09/98/3382  . Seasonal allergic rhinitis due to pollen 09/24/2015  . Cervical radiculopathy 07/25/2015  . Chronic coronary artery disease 07/25/2015  . Coronary artery disease 07/25/2015  . Disturbance in sleep behavior 07/25/2015  . Elevated CK 07/25/2015  . Elevated creatine kinase level 07/25/2015  . Generalized osteoarthritis of hand 07/25/2015  . Other long term (current) drug therapy 07/25/2015  . Overactive bladder 07/25/2015  . Restless leg 07/25/2015  . Restless leg syndrome 07/25/2015  . Sleep disturbances 07/25/2015  . Idiopathic peripheral neuropathy 07/04/2015  . Lumbar radiculopathy, chronic 07/04/2015  . 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 Risk for falls 07/04/2015  . Sleep disturbance 07/04/2015  . Chest pain with moderate risk for cardiac etiology 06/13/2015  . Right sciatic nerve pain 10/19/2014  . Abnormal thyroid blood test 08/25/2014  . Chronic fatigue 08/09/2014  . Multiple myeloma ([place]) 07/21/2013  . Depression 01/14/2013  . Generalized anxiety disorder 01/14/2013  . Gastroesophageal reflux disease 01/13/2013  . Urine test positive for  microalbuminuria 12/08/2011  . Cyst of ovary 12/17/2010  . KNEE PAIN, BILATERAL 08/15/2010  . Abnormal gait 08/15/2010  . Atherosclerosis of coronary artery bypass graft 10/11/2009  . CHEST PAIN, NON-CARDIAC 10/11/2009  . HIP PAIN, RIGHT 08/13/2009  . GREATER TROCHANTERIC BURSITIS 08/13/2009  . LUMBOSACRAL SPONDYLOSIS WITHOUT MYELOPATHY 11/10/2008  . Osteoarthritis of lumbosacral spine without myelopathy 11/10/2008  . FOOT PAIN, BILATERAL 06/30/2008  . Hyperlipidemia 06/22/2008  . Hypertension 06/22/2008  . Raynaud's disease 06/22/2008  . BURSITIS, LEFT SHOULDER 06/22/2008   Social History   Socioeconomic History  . Marital status: Married    Spouse name: Not on file  . Number of children: 4  . Years of education: Not on file  . Highest education level: Not on file  Occupational History  . Occupation: homemaker   . Occupation: Materials engineer education  Social Needs  . Financial resource strain: Not on file  . Food insecurity:    Worry: Not on file    Inability: Not on file  . Transportation needs:    Medical: Not on file    Non-medical: Not on file  Tobacco Use  . Smoking status: Never Smoker  . Smokeless tobacco: Never Used  Substance and Sexual Activity  . Alcohol use: Yes    Comment: occasional  . Drug use: No  . Sexual activity: Never  Lifestyle  . Physical activity:    Days per week: Not on file    Minutes per session: Not on file  . Stress: Not on file  Relationships  . Social connections:    Talks on phone: Not on file    Gets together: Not on file    Attends religious service: Not on file    Active member of club or organization: Not on file    Attends meetings of clubs or organizations: Not on file    Relationship status: Not on file  . Intimate partner  violence:    Fear of current or ex partner: Not on file    Emotionally abused: Not on file    Physically abused: Not on file    Forced sexual activity: Not on file  Other Topics Concern  . Not  on file  Social History Narrative  . Not on file    Ms. Danowski's family history includes Breast cancer in her paternal aunt; CAD in her mother; Heart attack in her mother; Obesity in her sister; Ulcers in her father.      Objective:    Vitals:   12/15/17 1025  BP: (!) 122/56  Pulse: 64    Physical Exam; well-developed elderly white female, frail-appearing in no acute distress, pleasant accompanied by her daughter blood pressure 122/56 pulse 64, height 5 foot 1, weight 94 pounds, BMI 17.6.  HEENT; nontraumatic normocephalic EOMI PERRLA sclera anicteric oropharynx clear, Extremities; no clubbing cyanosis or edema skin warm and dry, Neuro psych; alert and oriented, grossly nonfocal mood and affect appropriate not further examined today       Assessment & Plan:   #57 84 year old white female with history of IBS and chronic constipation now with 2-year history of intermittent episodes of upper abdominal pain described as squeezing and pressure-like usually occurring an hour or so after meals and lasting for 1 to 2 hours and generally partially relieved by sitting down or lying down.  No associated nausea vomiting or diarrhea. Question had been raised about mesenteric ischemia.  Most recent CT angios showed patent SMA and IMA probable median arcuate ligament compression of the celiac. It is possible that her symptoms are secondary to the median arcuate ligament compression. Decision had been made not to pursue aggressive management unless her symptoms significantly worsened. Her symptoms have been stable, and occurring a few times per week according to history. She has not tried as needed Levsin.  #2 GERD-currently off PPI therapy #3 IBS/chronic constipation-currently on senna lax #4 dementia #5 history of multiple myeloma in remission #6 coronary artery disease status post MI and previous CABG and early recent non-ST MI proximally 6 months ago requiring PCI of the circumflex.  On chronic  Plavix  Plan; restart Prevacid 30 mg p.o. every morning She may continue to use Gaviscon 30 to 45 cc as needed for indigestion gas and bloating Continue senna lax once daily in the evening, sent in order for her to be given MiraLAX 17 g in 8 ounces of water on a as needed basis if she does not have a bowel movement for 2 days Her daughter plans to hang a sign in her room reminding her to ask for Levsin sublingual for episodes of acute upper abdominal discomfort/pain postprandially We had a long discussion regarding appropriate foods and amounts her questions were answered to her satisfaction. We will plan to see her back in follow-up in 2 months and happy to see her sooner in the interim.  Amy S Esterwood PA-C 12/15/2017   Cc: [facility]*   Addendum: Reviewed and agree with ongoing management. Pyrtle, Lajuan Lines, MD

## 2017-12-15 NOTE — Patient Instructions (Signed)
If you are age 84 or older, your body mass index should be between 23-30. Your Body mass index is 17.66 kg/m. If this is out of the aforementioned range listed, please consider follow up with your Primary Care Provider.  Restart Prevacid 30 mg by mouth every morning. Use Levsin 0.125 mg as needed for episodes of abdominal pain. Continue VSL #3 daily. Use Miralax 17 grams in 8 oz of water if no bowel movemen for 2 days. Continue Sennalax daily. May use Gavascon as needed for gas.   We will fax the office note to [facility].   We have made you an office visit appointment with Dr. Hilarie Fredrickson for 02-23-2018 at 1:45 PM.

## 2017-12-16 ENCOUNTER — Telehealth: Payer: Self-pay | Admitting: Oncology

## 2017-12-16 NOTE — Telephone Encounter (Signed)
Tried to ,reach  Regarding change in date

## 2017-12-22 ENCOUNTER — Telehealth: Payer: Self-pay | Admitting: Physician Assistant

## 2017-12-22 NOTE — Telephone Encounter (Signed)
Pt has been having diarrhea for few days, she needs some advise.

## 2017-12-22 NOTE — Telephone Encounter (Signed)
Pt states she started having diarrhea again on Friday and she felt it was related to what she ate. Since then she has followed the BRAT diet as closely as she can. Still having some diarrhea. She has not taken the senna on her medlist. Asked pt if she had taken Imodium but she states she is at an assisted living facility, [facility], and she was told they did not have an order for Imodium. Please advise.

## 2017-12-22 NOTE — Telephone Encounter (Signed)
Left message for pt to call back  °

## 2017-12-23 NOTE — Telephone Encounter (Signed)
Call and see if any persistent diarrhea- I just saw her in office for constipation- I wouldn't change her regimen unless ongoing diarrhea

## 2017-12-23 NOTE — Telephone Encounter (Signed)
Pt states she is some better but she did take senna this morning. She thought she was going to pass gas and it was some liquid stool. Discussed with her that she should hold the senna until her stools form again and then she could resume the senna so she does not become constipated.

## 2018-01-01 DIAGNOSIS — K581 Irritable bowel syndrome with constipation: Secondary | ICD-10-CM | POA: Diagnosis not present

## 2018-01-05 ENCOUNTER — Other Ambulatory Visit: Payer: Self-pay

## 2018-01-05 MED ORDER — CARVEDILOL 3.125 MG PO TABS: 3.1250 mg | ORAL_TABLET | Freq: Two times a day (BID) | ORAL | 3 refills | Status: AC

## 2018-01-08 ENCOUNTER — Ambulatory Visit: Payer: Self-pay | Admitting: Oncology

## 2018-01-08 ENCOUNTER — Other Ambulatory Visit: Payer: Self-pay

## 2018-01-12 DIAGNOSIS — E785 Hyperlipidemia, unspecified: Secondary | ICD-10-CM | POA: Diagnosis not present

## 2018-01-12 DIAGNOSIS — I2510 Atherosclerotic heart disease of native coronary artery without angina pectoris: Secondary | ICD-10-CM | POA: Diagnosis not present

## 2018-01-12 DIAGNOSIS — I10 Essential (primary) hypertension: Secondary | ICD-10-CM | POA: Diagnosis not present

## 2018-01-12 DIAGNOSIS — R634 Abnormal weight loss: Secondary | ICD-10-CM | POA: Diagnosis not present

## 2018-01-13 DIAGNOSIS — H16223 Keratoconjunctivitis sicca, not specified as Sjogren's, bilateral: Secondary | ICD-10-CM | POA: Diagnosis not present

## 2018-01-14 ENCOUNTER — Ambulatory Visit: Payer: PPO | Admitting: Physician Assistant

## 2018-01-14 ENCOUNTER — Encounter: Payer: Self-pay | Admitting: Physician Assistant

## 2018-01-14 VITALS — BP 106/40 | HR 72 | Ht 61.25 in | Wt 95.0 lb

## 2018-01-14 DIAGNOSIS — K582 Mixed irritable bowel syndrome: Secondary | ICD-10-CM

## 2018-01-14 NOTE — Progress Notes (Addendum)
Subjective:    Patient ID: [name], female    DOB: 1931-06-23, 84 y.o.   MRN: [id number]  HPI [name] is a pleasant 84 year old white female, established with Dr. Hilarie Fredrickson, and known by myself.  Comes back in today for follow-up of IBS.  She is a resident of [facility] assisted living facility. Patient has history of coronary artery disease is status post CABG.  She did have a non-STEMI in December 2018 with PCI to the circumflex.  Also with hypertension, mixed dementia, Raynauds and history of multiple myeloma. She had undergone work-up earlier this year for complaints of episodic upper abdominal pain which she described as squeezing and pressure-like, occurring an hour or so postprandially and partially relieved by lying down.  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  Decision is been made not to pursue aggressive management unless she had significant worsening of symptoms or deterioration area Patient was last seen in the office about a month ago that time having more difficulty with constipation, and was using senna lax on a daily basis.  She was to use MiraLAX as needed and She was also started on Levsin sublingual as needed for any episodes of postprandial abdominal pain.  She was concerned about ongoing acid reflux symptoms and wanted to resume a trial of Prevacid.  We restarted Prevacid 30 mg every morning.  Patient keeps meticulous records of her p.o. intake and is very focused on meal and food intake.  She has multiple food intolerances that aggravate her IBS symptoms.  She appears a bit frustrated with being in an assisted living facility not having control over her meals and dietary intake.  She does have a small refrigerator, inability to keep some foods in her apartment. According to her daughter she started having difficulty with diarrhea shortly after starting Prevacid, eventually this  was sorted out at the assisted living facility and it was stopped.  Patient says she is doing better over this past week.  They placed her on a trial of Questran and she is now down to a half a pack every other day.  This seems to be working she says occasionally she will still have a loose stool. Patient gave no complaints today regarding any episodes of significant abdominal pain postprandially she has had some indigestion. She had multiple questions about what particular foods she should avoid, and what she should eat.  Daughter was requesting an IBS type diet.  Review of Systems Pertinent positive and negative review of systems were noted in the above HPI section.  All other review of systems was otherwise negative.  Outpatient Encounter Medications as of 82/82/2019  Medication Sig  . acetaminophen (TYLENOL) 650 MG CR tablet Take 1,300 mg by mouth 2 (two) times daily.  . Alum Hydroxide-Mag Carbonate (GAVISCON EXTRA STRENGTH) 6801725887 MG/10ML SUSP Take by mouth. Takes 4 times daily as needed  . aspirin EC 81 MG tablet Take 1 tablet (81 mg total) by mouth daily.  . Calcium Carb-Cholecalciferol (CALCIUM 500 + D3) 500-600 MG-UNIT TABS Take 1 tablet by mouth daily.  . carvedilol (COREG) 3.125 MG tablet Take 1 tablet (3.125 mg total) by mouth 2 (two) times daily with a meal.  . cholestyramine (QUESTRAN) 4 GM/DOSE powder Take by mouth as directed. 1/2 PACK EVERY OTHER DAY  . clopidogrel (PLAVIX) 75 MG tablet Take last dose of Brilinta 04/01/17 in evening. Take 4 tablets  of Clopidogrel on 04/02/17 then take 1 tablet by mouth daily thereafter. (Patient taking differently: Take 75 mg by mouth daily. )  . cycloSPORINE (RESTASIS) 0.05 % ophthalmic emulsion 1 drop 2 (two) times daily.  Marland Kitchen docusate sodium (COLACE) 100 MG capsule Take 1 capsule (100 mg total) by mouth daily as needed. (Patient taking differently: Take 100 mg by mouth See admin instructions. Take 1 capsule (169m) by mouth daily and 1 capsule  (10560m daily as needed.)  . hydrALAZINE (APRESOLINE) 10 MG tablet Take 10 mg by mouth 3 (three) times daily as needed.  . hydrALAZINE (APRESOLINE) 25 MG tablet Take 25 mg by mouth as directed. If systolic > 18295. Hyoscyamine Sulfate SL (LEVSIN/SL) 0.125 MG SUBL Take 1 tablet, dissolve under the tongue every 6 hours as needed for abdominal pain.  . iron polysaccharides (NU-IRON) 150 MG capsule Take 150 mg by mouth 2 (two) times daily.   . isosorbide mononitrate (IMDUR) 60 MG 24 hr tablet Take 60 mg by mouth daily.  . Marland Kitchenevocetirizine (XYZAL) 5 MG tablet Take 1 tablet (5 mg total) by mouth every evening. (Patient taking differently: Take 5 mg by mouth at bedtime. )  . LORazepam (ATIVAN) 0.5 MG tablet Take 0.25-0.5 mg by mouth See admin instructions. Take 0.25 mg in the morning and 0.60m28mt bedtime.  May take an additional 0.5 mg twice daily as needed for anxiety  . Melatonin 5 MG TABS Take 5 mg by mouth at bedtime.  . memantine (NAMENDA) 10 MG tablet Take 10 mg by mouth 2 (two) times daily.   . mirabegron ER (MYRBETRIQ) 25 MG TB24 tablet Take 25 mg by mouth every morning.   . montelukast (SINGULAIR) 10 MG tablet Take 10 mg by mouth at bedtime.   . Multiple Vitamins-Minerals (CENTRUM SILVER 50+WOMEN) TABS Take 1 tablet by mouth daily.  . nitroGLYCERIN (NITROSTAT) 0.4 MG SL tablet Place 1 tablet (0.4 mg total) under the tongue every 5 (five) minutes as needed for chest pain. Reported on 11/12/2015  . polyethylene glycol (MIRALAX / GLYCOLAX) packet Take 17 g by mouth daily.   . prednisoLONE acetate (PRED FORTE) 1 % ophthalmic suspension Place 1 drop into both eyes 2 (two) times daily as needed.  . PMarland KitchenEMARIN vaginal cream Place 1 Applicatorful vaginally once a week.   . Probiotic Product (VSL#3 PO) Take 1 packet by mouth every morning.  . sertraline (ZOLOFT) 25 MG tablet Take 25-50 mg by mouth See admin instructions. Takes 260m360m AM and 50mg86mPM.   . [DISCONTINUED] hydrALAZINE (APRESOLINE) 25 MG tablet  Take 25 mg by mouth 2 (two) times daily.  . [DISCONTINUED] senna-docusate (SENNALAX-S) 8.6-50 MG tablet Take 1 tablet by mouth daily.   No facility-administered encounter medications on file as of 82/82/2019.    Allergies  Allergen Reactions  . Butazolidin [Phenylbutazone] Swelling and Rash  . Cephalosporins Other (See Comments)    unknown  . Codeine Nausea Only  . Levaquin [Levofloxacin] Shortness Of Breath  . Macrodantin Other (See Comments)    Double vision  . Morphine And Related Other (See Comments)    hallucinations  . Trimethoprim Nausea Only  . Buprenorphine     From MAR  . Cilostazol Other (See Comments)    Stomach upset  . Dexilant [Dexlansoprazole] Diarrhea  . Diclofenac     Documented on MAR  . Erythromycin Nausea And Vomiting    All "mycin" drugs  . Lactose Intolerance (Gi)   . Morphine   . Nitrofurantoin  Double vision  . Simvastatin Other (See Comments)    Muscle aches   . Sulfa Antibiotics Other (See Comments)    unknown  . Vesicare [Solifenacin] Other (See Comments)    unknown  . Clindamycin/Lincomycin Other (See Comments)    Upset stomach  . Doxycycline Diarrhea  . Food Other (See Comments)    No spicy foods or black pepper, raw onions - causes gerd  . Ibuprofen Hives and Rash  . Lincomycin Hcl Other (See Comments)    Upset stomach  . Penicillins Rash    Has patient had a PCN reaction causing immediate rash, facial/tongue/throat swelling, SOB or lightheadedness with hypotension: Yes Has patient had a PCN reaction causing severe rash involving mucus membranes or skin necrosis: No Has patient had a PCN reaction that required hospitalization No Has patient had a PCN reaction occurring within the last 10 years: No If all of the above answers are "NO", then may proceed with Cephalosporin use.   Lisbeth Ply [Fesoterodine Fumarate Er] Other (See Comments)    unknown  . Voltaren [Diclofenac Sodium] Rash   Patient Active Problem List   Diagnosis Date  Noted  . Left thyroid nodule 08/29/2017  . Chronic idiopathic constipation 08/18/2017  . Abdominal pain 07/31/2017  . Mesenteric ischemia ([place]) 05/04/2017  . Unsteadiness on feet   . Unstable angina pectoris ([place])   . Tinea pedis   . Spinal stenosis of lumbar region   . Somnolence   . Seasonal allergic rhinitis   . Restless legs syndrome   . Raynaud's syndrome   . Raynauds syndrome   . Radiculopathy   . Panic disorder   . Ovarian cyst, bilateral   . Osteopenia   . Myocardial infarction ([place])   . Muscle weakness (generalized)   . Mixed incontinence   . Mild chronic anemia   . Major depressive disorder   . Long term (current) use of aspirin   . Intermediate coronary syndrome ([place])   . Incontinent of urine   . IBS (irritable bowel syndrome)   . Hypercholesteremia   . Hx of CABG   . GERD (gastroesophageal reflux disease)   . Dementia   . Chest pain   . Atherosclerotic heart disease of native coronary artery without angina pectoris   . Arthritis   . Anxiety   . Allergic rhinitis   . Abnormal weight loss   . History of adenomatous polyp of colon 03/02/2017  . NSTEMI (non-ST elevated myocardial infarction) ([place]) 02/02/2017  . SOB (shortness of breath) 09/02/2016  . Dizziness 09/02/2016  . Non-ST elevation (NSTEMI) myocardial infarction ([place]) 11/26/2015  . Polymyalgia rheumatica ([place]) 11/26/2015  . Hypokalemia 11/26/2015  . Mild persistent asthma 11/12/2015  . Coronary artery disease involving autologous artery coronary bypass graft with angina pectoris with documented spasm ([place]) 11/12/2015  . Multiple myeloma in remission ([place]) 11/12/2015  . Current use of beta blocker 11/12/2015  . Gastroesophageal reflux disease without esophagitis 11/12/2015  . Other allergic rhinitis 11/12/2015  . Weight loss, non-intentional 10/03/2015  . Asthma 09/24/2015  . Asthma in adult without complication 36/64/4034  . Seasonal allergic rhinitis due to pollen 09/24/2015  . Cervical  radiculopathy 07/25/2015  . Chronic coronary artery disease 07/25/2015  . Coronary artery disease 07/25/2015  . Disturbance in sleep behavior 07/25/2015  . Elevated CK 07/25/2015  . Elevated creatine kinase level 07/25/2015  . Generalized osteoarthritis of hand 07/25/2015  . Other long term (current) drug therapy 07/25/2015  . Overactive bladder 07/25/2015  . Restless  leg 07/25/2015  . Restless leg syndrome 07/25/2015  . Sleep disturbances 07/25/2015  . Idiopathic peripheral neuropathy 07/04/2015  . Lumbar radiculopathy, chronic 07/04/2015  . 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 Risk for falls 07/04/2015  . Sleep disturbance 07/04/2015  . Chest pain with moderate risk for cardiac etiology 06/13/2015  . Right sciatic nerve pain 10/19/2014  . Abnormal thyroid blood test 08/25/2014  . Chronic fatigue 08/09/2014  . Multiple myeloma ([place]) 07/21/2013  . Depression 01/14/2013  . Generalized anxiety disorder 01/14/2013  . Gastroesophageal reflux disease 01/13/2013  . Urine test positive for microalbuminuria 12/08/2011  . Cyst of ovary 12/17/2010  . KNEE PAIN, BILATERAL 08/15/2010  . Abnormal gait 08/15/2010  . Atherosclerosis of coronary artery bypass graft 10/11/2009  . CHEST PAIN, NON-CARDIAC 10/11/2009  . HIP PAIN, RIGHT 08/13/2009  . GREATER TROCHANTERIC BURSITIS 08/13/2009  . LUMBOSACRAL SPONDYLOSIS WITHOUT MYELOPATHY 11/10/2008  . Osteoarthritis of lumbosacral spine without myelopathy 11/10/2008  . FOOT PAIN, BILATERAL 06/30/2008  . Hyperlipidemia 06/22/2008  . Hypertension 06/22/2008  . Raynaud's disease 06/22/2008  . BURSITIS, LEFT SHOULDER 06/22/2008   Social History   Socioeconomic History  . Marital status: Married    Spouse name: Not on file  . Number of children: 4  . Years of education: Not on file  . Highest education level: Not on file  Occupational History  . Occupation: homemaker   . Occupation: Materials engineer education  Social Needs  . Financial resource  strain: Not on file  . Food insecurity:    Worry: Not on file    Inability: Not on file  . Transportation needs:    Medical: Not on file    Non-medical: Not on file  Tobacco Use  . Smoking status: Never Smoker  . Smokeless tobacco: Never Used  Substance and Sexual Activity  . Alcohol use: Never    Frequency: Never  . Drug use: No  . Sexual activity: Never  Lifestyle  . Physical activity:    Days per week: Not on file    Minutes per session: Not on file  . Stress: Not on file  Relationships  . Social connections:    Talks on phone: Not on file    Gets together: Not on file    Attends religious service: Not on file    Active member of club or organization: Not on file    Attends meetings of clubs or organizations: Not on file    Relationship status: Not on file  . Intimate partner violence:    Fear of current or ex partner: Not on file    Emotionally abused: Not on file    Physically abused: Not on file    Forced sexual activity: Not on file  Other Topics Concern  . Not on file  Social History Narrative  . Not on file    Ms. Mehler's family history includes Breast cancer in her paternal aunt; CAD in her mother; Heart attack in her mother; Obesity in her sister; Ulcers in her father.      Objective:    Vitals:   01/14/18 0929  BP: (!) 106/40  Pulse: 72    Physical Exam; well-developed elderly white female, in no acute distress, well-dressed as usual.  Accompanied by her daughter.  Height 5 foot 1, weight 95 ( up 1 pound from last office visit) BMI 17.8.  HEENT ;nontraumatic normocephalic EOMI PERRLA sclera anicteric, Cardiovascular; regular rate and rhythm with S1-S2 no murmur rub or gallop, patient's left lateral chest and  back examined, no rash or lesion, no palpable tenderness, no ecchymoses.  Abdomen ;soft no palpable mass or hepatosplenomegaly no focal tenderness Extremities ;no clubbing cyanosis or edema skin warm dry, Neuro psych; alert and oriented, grossly  nonfocal, mood and affect appropriate       Assessment & Plan:   #35 84 year old white female with chronic IBS, alternating bowel habits and multiple food intolerances. 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  GERD  Plan; patient will stay off Prevacid-intolerant with diarrhea Consider trial of ranitidine 150 mg once daily-we will hold for now Continue Questran one half package every other day Start trial of IBgard 1 p.o. twice daily before meals We will use Gaviscon on an as-needed basis for indigestion and GERD type symptoms  Greater than 30 minutes today spent in discussion with the patient and her daughter regarding dietary management.  She needs to stay on a bland diet, avoidance of red meats, sauces spices onion and garlic leaks.  Moderate fiber intake.  There is composing a list of most of the foods that she can tolerate which she will share with the staff at [facility]. I believe that patient's dementia and tendency towards OCD are key, she is hyper focused on diet, She has follow-up with Dr. Hilarie Fredrickson in mid September, she is doing well, daughter may cancel that appointment.  Amy S Esterwood PA-C 01/14/2018   Cc: [place] At*   Addendum: Reviewed and agree with ongoing management. Pyrtle, Lajuan Lines, MD

## 2018-01-14 NOTE — Patient Instructions (Signed)
Normal BMI (Body Mass Index- based on height and weight) is between 23 and 30. Your BMI today is Body mass index is 17.8 kg/m. Marland Kitchen Please consider follow up  regarding your BMI with your Primary Care Provider.  Take 1 IBGARD capsule once daily.  Samples provided.   You have an appointment with Dr. Hilarie Fredrickson on 02-23-2018 at 1:45 PM.  If you need to be seen between now and then regarding dietary issues you can make an appointment with Nicoletta Ba PA.

## 2018-01-18 ENCOUNTER — Inpatient Hospital Stay: Payer: PPO | Attending: Nurse Practitioner

## 2018-01-18 ENCOUNTER — Inpatient Hospital Stay (HOSPITAL_BASED_OUTPATIENT_CLINIC_OR_DEPARTMENT_OTHER): Payer: PPO | Admitting: Nurse Practitioner

## 2018-01-18 ENCOUNTER — Encounter: Payer: Self-pay | Admitting: Nurse Practitioner

## 2018-01-18 VITALS — BP 145/60 | HR 62 | Temp 97.7°F | Resp 18 | Ht 61.25 in | Wt 94.5 lb

## 2018-01-18 DIAGNOSIS — K580 Irritable bowel syndrome with diarrhea: Secondary | ICD-10-CM

## 2018-01-18 DIAGNOSIS — M8580 Other specified disorders of bone density and structure, unspecified site: Secondary | ICD-10-CM | POA: Diagnosis not present

## 2018-01-18 DIAGNOSIS — C9000 Multiple myeloma not having achieved remission: Secondary | ICD-10-CM | POA: Insufficient documentation

## 2018-01-18 DIAGNOSIS — K581 Irritable bowel syndrome with constipation: Secondary | ICD-10-CM | POA: Diagnosis not present

## 2018-01-18 LAB — CMP (CANCER CENTER ONLY)
ALBUMIN: 3.6 g/dL (ref 3.5–5.0)
ALK PHOS: 44 U/L (ref 38–126)
ALT: 13 U/L (ref 0–44)
ANION GAP: 10 (ref 5–15)
AST: 27 U/L (ref 15–41)
BUN: 19 mg/dL (ref 8–23)
CO2: 27 mmol/L (ref 22–32)
Calcium: 9.1 mg/dL (ref 8.9–10.3)
Chloride: 96 mmol/L — ABNORMAL LOW (ref 98–111)
Creatinine: 0.8 mg/dL (ref 0.44–1.00)
GFR, Estimated: >60 mL/min (ref >60)
GLUCOSE: 84 mg/dL (ref 70–99)
Potassium: 4 mmol/L (ref 3.5–5.1)
Sodium: 133 mmol/L — ABNORMAL LOW (ref 135–145)
Total Bilirubin: 0.3 mg/dL (ref 0.3–1.2)
Total Protein: 8.7 g/dL — ABNORMAL HIGH (ref 6.5–8.1)

## 2018-01-18 LAB — CBC WITH DIFFERENTIAL (CANCER CENTER ONLY)
BASOS PCT: 1 %
Basophils Absolute: 0 10*3/uL (ref 0.0–0.1)
Eosinophils Absolute: 0.1 10*3/uL (ref 0.0–0.5)
Eosinophils Relative: 4 %
HEMATOCRIT: 28.1 % — AB (ref 34.8–46.6)
HEMOGLOBIN: 9.4 g/dL — AB (ref 11.6–15.9)
LYMPHS PCT: 28 %
Lymphs Abs: 0.8 10*3/uL — ABNORMAL LOW (ref 0.9–3.3)
MCH: 34.2 pg — ABNORMAL HIGH (ref 25.1–34.0)
MCHC: 33.5 g/dL (ref 31.5–36.0)
MCV: 102.2 fL — AB (ref 79.5–101.0)
MONO ABS: 0.5 10*3/uL (ref 0.1–0.9)
MONOS PCT: 17 %
NEUTROS ABS: 1.4 10*3/uL — AB (ref 1.5–6.5)
NEUTROS PCT: 50 %
Platelet Count: 283 10*3/uL (ref 145–400)
RBC: 2.75 MIL/uL — ABNORMAL LOW (ref 3.70–5.45)
RDW: 14.1 % (ref 11.2–14.5)
WBC Count: 2.7 10*3/uL — ABNORMAL LOW (ref 3.9–10.3)

## 2018-01-18 NOTE — Progress Notes (Addendum)
  [place] OFFICE PROGRESS NOTE   Diagnosis: Indolent multiple myeloma  INTERVAL HISTORY:   [name] returns as scheduled.  She reports her main issue is related to IBS with alternating constipation and diarrhea.  He has occasional gas pain.  No interim infections.  She recently noted left lateral chest pain/tenderness.  This has improved.  She wonders if she strained a muscle doing chair yoga or tai chi.   Energy level is poor.  Objective:  Vital signs in last 24 hours:  Blood pressure (!) 145/60, pulse 62, temperature 97.7 F (36.5 C), temperature source Oral, resp. rate 18, height 5' 1.25" (1.556 m), weight 94 lb 8 oz (42.9 kg), SpO2 100 %.    HEENT: No thrush or ulcers. Resp: Distant breath sounds.  No respiratory distress. Cardio: Regular rate and rhythm. GI: Abdomen soft and nontender.  No hepatosplenomegaly. Vascular: Trace lower leg edema bilaterally.    Lab Results:  Lab Results  Component Value Date   WBC 2.7 (L) 01/18/2018   HGB 9.4 (L) 01/18/2018   HCT 28.1 (L) 01/18/2018   MCV 102.2 (H) 01/18/2018   PLT 283 01/18/2018   NEUTROABS 1.4 (L) 01/18/2018    Imaging:  No results found.  Medications: I have reviewed the patient's current medications.  Assessment/Plan: 1. Indolent multiple myeloma, asymptomatic. No clinical evidence of disease progression. A metastatic bone survey in January 2019 was negative for lytic lesions.  2. Mild anemia secondary to multiple myeloma 3. History of neutropenia-likely secondary to myeloma 4. History of coronary artery disease. 5. Osteopenia. 6. History of multiple urinary tract infections, followed by Dr. Diona Fanti. 7. "Raynaud" syndrome. 8. Chronic back pain. 9. History of hyponatremia. 10. Right foot surgery February 2014 11. Bilateral ovarian cysts-followed by GYN oncology 12. Elevated liver enzymes December 2018; liver enzymes normal on labs done 07/07/2017.  Disposition: Ms. Labell appears  unchanged.  There is no clinical evidence of disease progression.  We reviewed the CBC from today, overall stable.  We will follow-up on the outstanding myeloma labs.  She will return for lab and follow-up in 6 months.  She will contact the office in the interim with any problems.  Patient seen with Dr. Benay Spice.    Ned Card ANP/GNP-BC   01/18/2018  12:21 PM  This was a shared visit with Ned Card.  Ms. Dehaas is stable from a hematologic standpoint.  We will follow-up on the myeloma panel from today.  The plan is to continue observation of the indolent myeloma.  Julieanne Manson, MD

## 2018-01-19 LAB — PROTEIN ELECTROPHORESIS, SERUM
A/G RATIO SPE: 0.9 (ref 0.7–1.7)
ALPHA-2-GLOBULIN: 0.9 g/dL (ref 0.4–1.0)
Albumin ELP: 3.7 g/dL (ref 2.9–4.4)
Alpha-1-Globulin: 0.2 g/dL (ref 0.0–0.4)
Beta Globulin: 2.9 g/dL — ABNORMAL HIGH (ref 0.7–1.3)
GLOBULIN, TOTAL: 4.2 g/dL — AB (ref 2.2–3.9)
Gamma Globulin: 0.2 g/dL — ABNORMAL LOW (ref 0.4–1.8)
M-Spike, %: 2.4 g/dL — ABNORMAL HIGH
Total Protein ELP: 7.9 g/dL (ref 6.0–8.5)

## 2018-01-19 LAB — KAPPA/LAMBDA LIGHT CHAINS
Kappa free light chain: 21.7 mg/L — ABNORMAL HIGH (ref 3.3–19.4)
Kappa, lambda light chain ratio: 0.06 — ABNORMAL LOW (ref 0.26–1.65)
LAMDA FREE LIGHT CHAINS: 386.6 mg/L — AB (ref 5.7–26.3)

## 2018-01-19 LAB — IGG: IgG (Immunoglobin G), Serum: 2490 mg/dL — ABNORMAL HIGH (ref 700–1600)

## 2018-01-21 ENCOUNTER — Telehealth: Payer: Self-pay

## 2018-01-21 NOTE — Telephone Encounter (Addendum)
LVM regarding message below ----- Message from Ladell Pier, MD sent at 01/19/2018  8:59 PM EDT ----- Please call patient, abnormal protein level is stable, f/lu as scheduled

## 2018-01-28 DIAGNOSIS — H16223 Keratoconjunctivitis sicca, not specified as Sjogren's, bilateral: Secondary | ICD-10-CM | POA: Diagnosis not present

## 2018-02-04 DIAGNOSIS — I2510 Atherosclerotic heart disease of native coronary artery without angina pectoris: Secondary | ICD-10-CM | POA: Diagnosis not present

## 2018-02-04 DIAGNOSIS — K582 Mixed irritable bowel syndrome: Secondary | ICD-10-CM | POA: Diagnosis not present

## 2018-02-04 DIAGNOSIS — F0390 Unspecified dementia without behavioral disturbance: Secondary | ICD-10-CM | POA: Diagnosis not present

## 2018-02-04 DIAGNOSIS — M059 Rheumatoid arthritis with rheumatoid factor, unspecified: Secondary | ICD-10-CM | POA: Diagnosis not present

## 2018-02-04 DIAGNOSIS — I10 Essential (primary) hypertension: Secondary | ICD-10-CM | POA: Diagnosis not present

## 2018-02-09 ENCOUNTER — Ambulatory Visit: Payer: PPO | Admitting: Sports Medicine

## 2018-02-09 ENCOUNTER — Encounter: Payer: Self-pay | Admitting: *Deleted

## 2018-02-09 DIAGNOSIS — M79674 Pain in right toe(s): Secondary | ICD-10-CM | POA: Diagnosis not present

## 2018-02-09 DIAGNOSIS — L600 Ingrowing nail: Secondary | ICD-10-CM

## 2018-02-09 DIAGNOSIS — L603 Nail dystrophy: Secondary | ICD-10-CM

## 2018-02-09 DIAGNOSIS — M79675 Pain in left toe(s): Secondary | ICD-10-CM | POA: Diagnosis not present

## 2018-02-09 MED ORDER — NEOMYCIN-POLYMYXIN-HC 3.5-10000-1 OT SOLN: OTIC | 1 refills | Status: DC

## 2018-02-09 NOTE — Patient Instructions (Signed)

## 2018-02-09 NOTE — Progress Notes (Signed)
Subjective: [name] is a 84 y.o. female patient seen today in office with complaint of mildly painful thickened and elongated toenails especially at left 1st toe medial margin most painful than the right great toe medial margin; unable to trim. Patient reports that forgot all of the information I told her last visit because she suffers with dementia.  Patient is unsure if she threw away the medication but I told her to put on her toenails to help with pain around ingrown nails however does state that she has been soaking and the left greater than right toenails are sore. Reports no other issues at this time.  Patient Active Problem List   Diagnosis Date Noted  . Left thyroid nodule 08/29/2017  . Chronic idiopathic constipation 08/18/2017  . Abdominal pain 07/31/2017  . Mesenteric ischemia ([place]) 05/04/2017  . Unsteadiness on feet   . Unstable angina pectoris ([place])   . Tinea pedis   . Spinal stenosis of lumbar region   . Somnolence   . Seasonal allergic rhinitis   . Restless legs syndrome   . Raynaud's syndrome   . Raynauds syndrome   . Radiculopathy   . Panic disorder   . Ovarian cyst, bilateral   . Osteopenia   . Myocardial infarction ([place])   . Muscle weakness (generalized)   . Mixed incontinence   . Mild chronic anemia   . Major depressive disorder   . Long term (current) use of aspirin   . Intermediate coronary syndrome ([place])   . Incontinent of urine   . IBS (irritable bowel syndrome)   . Hypercholesteremia   . Hx of CABG   . GERD (gastroesophageal reflux disease)   . Dementia   . Chest pain   . Atherosclerotic heart disease of native coronary artery without angina pectoris   . Arthritis   . Anxiety   . Allergic rhinitis   . Abnormal weight loss   . History of adenomatous polyp of colon 03/02/2017  . NSTEMI (non-ST elevated myocardial infarction) ([place]) 02/02/2017  . SOB (shortness of breath) 09/02/2016  . Dizziness 09/02/2016  . Non-ST elevation (NSTEMI)  myocardial infarction ([place]) 11/26/2015  . Polymyalgia rheumatica ([place]) 11/26/2015  . Hypokalemia 11/26/2015  . Mild persistent asthma 11/12/2015  . Coronary artery disease involving autologous artery coronary bypass graft with angina pectoris with documented spasm ([place]) 11/12/2015  . Multiple myeloma in remission ([place]) 11/12/2015  . Current use of beta blocker 11/12/2015  . Gastroesophageal reflux disease without esophagitis 11/12/2015  . Other allergic rhinitis 11/12/2015  . Weight loss, non-intentional 10/03/2015  . Asthma 09/24/2015  . Asthma in adult without complication 76/80/8811  . Seasonal allergic rhinitis due to pollen 09/24/2015  . Cervical radiculopathy 07/25/2015  . Chronic coronary artery disease 07/25/2015  . Coronary artery disease 07/25/2015  . Disturbance in sleep behavior 07/25/2015  . Elevated CK 07/25/2015  . Elevated creatine kinase level 07/25/2015  . Generalized osteoarthritis of hand 07/25/2015  . Other long term (current) drug therapy 07/25/2015  . Overactive bladder 07/25/2015  . Restless leg 07/25/2015  . Restless leg syndrome 07/25/2015  . Sleep disturbances 07/25/2015  . Idiopathic peripheral neuropathy 07/04/2015  . Lumbar radiculopathy, chronic 07/04/2015  . 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 Risk for falls 07/04/2015  . Sleep disturbance 07/04/2015  . Chest pain with moderate risk for cardiac etiology 06/13/2015  . Right sciatic nerve pain 10/19/2014  . Abnormal thyroid blood test 08/25/2014  . Chronic fatigue 08/09/2014  . Multiple myeloma ([place]) 07/21/2013  .  Depression 01/14/2013  . Generalized anxiety disorder 01/14/2013  . Gastroesophageal reflux disease 01/13/2013  . Urine test positive for microalbuminuria 12/08/2011  . Cyst of ovary 12/17/2010  . KNEE PAIN, BILATERAL 08/15/2010  . Abnormal gait 08/15/2010  . Atherosclerosis of coronary artery bypass graft 10/11/2009  . CHEST PAIN, NON-CARDIAC 10/11/2009  . HIP PAIN, RIGHT 08/13/2009  .  GREATER TROCHANTERIC BURSITIS 08/13/2009  . LUMBOSACRAL SPONDYLOSIS WITHOUT MYELOPATHY 11/10/2008  . Osteoarthritis of lumbosacral spine without myelopathy 11/10/2008  . FOOT PAIN, BILATERAL 06/30/2008  . Hyperlipidemia 06/22/2008  . Hypertension 06/22/2008  . Raynaud's disease 06/22/2008  . BURSITIS, LEFT SHOULDER 06/22/2008    Current Outpatient Medications on File Prior to Visit  Medication Sig Dispense Refill  . acetaminophen (TYLENOL) 650 MG CR tablet Take 1,300 mg by mouth 2 (two) times daily.    . Alum Hydroxide-Mag Carbonate (GAVISCON EXTRA STRENGTH) [phone] MG/10ML SUSP Take by mouth. Takes 4 times daily as needed    . aspirin EC 81 MG tablet Take 1 tablet (81 mg total) by mouth daily. 90 tablet 3  . Calcium Carb-Cholecalciferol (CALCIUM 500 + D3) 500-600 MG-UNIT TABS Take 1 tablet by mouth daily.    . carvedilol (COREG) 3.125 MG tablet Take 1 tablet (3.125 mg total) by mouth 2 (two) times daily with a meal. 60 tablet 3  . cholestyramine (QUESTRAN) 4 GM/DOSE powder Take by mouth as directed. 1/2 PACK EVERY OTHER DAY    . clopidogrel (PLAVIX) 75 MG tablet Take last dose of Brilinta 04/01/17 in evening. Take 4 tablets of Clopidogrel on 04/02/17 then take 1 tablet by mouth daily thereafter. (Patient taking differently: Take 75 mg by mouth daily. ) 94 tablet 1  . cycloSPORINE (RESTASIS) 0.05 % ophthalmic emulsion 1 drop 2 (two) times daily.    . hydrALAZINE (APRESOLINE) 10 MG tablet Take 10 mg by mouth 3 (three) times daily as needed.    . hydrALAZINE (APRESOLINE) 25 MG tablet Take 25 mg by mouth as directed. If systolic > 882    . Hyoscyamine Sulfate SL (LEVSIN/SL) 0.125 MG SUBL Take 1 tablet, dissolve under the tongue every 6 hours as needed for abdominal pain. 20 each 1  . iron polysaccharides (NU-IRON) 150 MG capsule Take 150 mg by mouth 2 (two) times daily.     . isosorbide mononitrate (IMDUR) 60 MG 24 hr tablet Take 60 mg by mouth daily.    Marland Kitchen levocetirizine (XYZAL) 5 MG tablet  Take 1 tablet (5 mg total) by mouth every evening. (Patient taking differently: Take 5 mg by mouth at bedtime. ) 30 tablet 5  . LORazepam (ATIVAN) 0.5 MG tablet Take 0.25-0.5 mg by mouth See admin instructions. Take 0.25 mg in the morning and 0.77m at bedtime.  May take an additional 0.5 mg twice daily as needed for anxiety    . Melatonin 5 MG TABS Take 5 mg by mouth at bedtime.    . memantine (NAMENDA) 10 MG tablet Take 10 mg by mouth 2 (two) times daily.     . mirabegron ER (MYRBETRIQ) 25 MG TB24 tablet Take 25 mg by mouth every morning.     . montelukast (SINGULAIR) 10 MG tablet Take 10 mg by mouth at bedtime.     . Multiple Vitamins-Minerals (CENTRUM SILVER 50+WOMEN) TABS Take 1 tablet by mouth daily.    . nitroGLYCERIN (NITROSTAT) 0.4 MG SL tablet Place 1 tablet (0.4 mg total) under the tongue every 5 (five) minutes as needed for chest pain. Reported on 11/12/2015 25 tablet  2  . polyethylene glycol (MIRALAX / GLYCOLAX) packet Take 17 g by mouth daily.     Marland Kitchen PREMARIN vaginal cream Place 1 Applicatorful vaginally once a week.     . Probiotic Product (VSL#3 PO) Take 1 packet by mouth every morning.    . sertraline (ZOLOFT) 25 MG tablet Take 25-50 mg by mouth See admin instructions. Takes 29m po AM and 553mpo PM.      No current facility-administered medications on file prior to visit.     Allergies  Allergen Reactions  . Butazolidin [Phenylbutazone] Swelling and Rash  . Cephalosporins Other (See Comments)    unknown  . Codeine Nausea Only  . Levaquin [Levofloxacin] Shortness Of Breath  . Macrodantin Other (See Comments)    Double vision  . Morphine And Related Other (See Comments)    hallucinations  . Trimethoprim Nausea Only  . Buprenorphine     From MAR  . Cilostazol Other (See Comments)    Stomach upset  . Dexilant [Dexlansoprazole] Diarrhea  . Diclofenac     Documented on MAR  . Erythromycin Nausea And Vomiting    All "mycin" drugs  . Lactose Intolerance ([place])   . Morphine    . Nitrofurantoin     Double vision  . Simvastatin Other (See Comments)    Muscle aches   . Sulfa Antibiotics Other (See Comments)    unknown  . Vesicare [Solifenacin] Other (See Comments)    unknown  . Clindamycin/Lincomycin Other (See Comments)    Upset stomach  . Doxycycline Diarrhea  . Food Other (See Comments)    No spicy foods or black pepper, raw onions - causes gerd  . Ibuprofen Hives and Rash  . Lincomycin Hcl Other (See Comments)    Upset stomach  . Penicillins Rash    Has patient had a PCN reaction causing immediate rash, facial/tongue/throat swelling, SOB or lightheadedness with hypotension: Yes Has patient had a PCN reaction causing severe rash involving mucus membranes or skin necrosis: No Has patient had a PCN reaction that required hospitalization No Has patient had a PCN reaction occurring within the last 10 years: No If all of the above answers are "NO", then may proceed with Cephalosporin use.   . Lisbeth PlyFesoterodine Fumarate Er] Other (See Comments)    unknown  . Voltaren [Diclofenac Sodium] Rash    Objective: Physical Exam  General: Well developed, nourished, no acute distress, awake, alert and oriented x 3  Vascular: Dorsalis pedis artery 1/4 bilateral, Posterior tibial artery 0/4 bilateral, skin temperature warm to warm proximal to distal bilateral lower extremities, mild varicosities, pedal hair present bilateral.  Neurological: Gross sensation present via light touch bilateral.   Dermatological: Skin is warm, dry, and supple bilateral, Nails 1-10 are tender, long, thick, and discolored with mild subungal debris with mild ingrowing without infection at Left>Right medial hallux, no webspace macerations present bilateral, no open lesions present bilateral, no callus/corns/hyperkeratotic tissue present bilateral. No signs of infection bilateral.  Musculoskeletal: Asymptomatic hallux malleous L>R boney deformities noted bilateral. Muscular strength 4/5  without painon range of motion.+ History of arthritis. No pain with calf compression bilateral.  Assessment and Plan:  Problem List Items Addressed This Visit    None    Visit Diagnoses    Ingrown nail    -  Primary   Toe pain, bilateral       Nail dystrophy          -Examined patient.  -Discussed treatment options for painful ingrowning  mycotic nails. -Mechanically debrided and reduced mycotic nails with sterile nail nipper and dremel nail file without incident. -Rx Epsom salt soaks and corticosporin solution and advised patient to keep up with her prescription this time to see if this will give her some additional benefit-Patient to return in 3 months for nail trim or sooner if symptoms worsen.  Landis Martins, DPM

## 2018-02-11 ENCOUNTER — Telehealth: Payer: Self-pay | Admitting: Sports Medicine

## 2018-02-11 NOTE — Telephone Encounter (Signed)
Its a topical so patient should be fine. Have them to do what's called a patch test where they apply a small amount to see how she reacts to it. If she has a allergic reaction immediately wash off, apply cortisone cream, and if any signs of anaphyalysis epi pen or ER immediately  -Dr. Cannon Kettle

## 2018-02-11 NOTE — Telephone Encounter (Signed)
[facility] states orders can be faxed to [phone]. Faxed orders of 02/11/2018 3:10pm to [facility], [employer].

## 2018-02-11 NOTE — Telephone Encounter (Signed)
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 Ms. Fowers saw Dr. Cannon Kettle on Tuesday, 03 September. Dr. Cannon Kettle had ordered foot soaks and also put her on cortisporin which has some neomycin in it. The pt has an allergy to all mycin drugs, so I was wanting to check and see what the doctor would like [name] to do on that. If you can give [name] a call at [phone]. Thank you.

## 2018-02-15 DIAGNOSIS — M542 Cervicalgia: Secondary | ICD-10-CM | POA: Diagnosis not present

## 2018-02-15 DIAGNOSIS — K580 Irritable bowel syndrome with diarrhea: Secondary | ICD-10-CM | POA: Diagnosis not present

## 2018-02-15 DIAGNOSIS — M353 Polymyalgia rheumatica: Secondary | ICD-10-CM | POA: Diagnosis not present

## 2018-02-15 DIAGNOSIS — M150 Primary generalized (osteo)arthritis: Secondary | ICD-10-CM | POA: Diagnosis not present

## 2018-02-15 DIAGNOSIS — M2550 Pain in unspecified joint: Secondary | ICD-10-CM | POA: Diagnosis not present

## 2018-02-15 DIAGNOSIS — I7300 Raynaud's syndrome without gangrene: Secondary | ICD-10-CM | POA: Diagnosis not present

## 2018-02-15 DIAGNOSIS — Z681 Body mass index (BMI) 19 or less, adult: Secondary | ICD-10-CM | POA: Diagnosis not present

## 2018-02-16 ENCOUNTER — Other Ambulatory Visit: Payer: Self-pay | Admitting: Obstetrics and Gynecology

## 2018-02-16 DIAGNOSIS — R87810 Cervical high risk human papillomavirus (HPV) DNA test positive: Secondary | ICD-10-CM | POA: Diagnosis not present

## 2018-02-16 DIAGNOSIS — N870 Mild cervical dysplasia: Secondary | ICD-10-CM | POA: Diagnosis not present

## 2018-02-16 DIAGNOSIS — R87612 Low grade squamous intraepithelial lesion on cytologic smear of cervix (LGSIL): Secondary | ICD-10-CM | POA: Diagnosis not present

## 2018-02-23 ENCOUNTER — Ambulatory Visit: Payer: PPO | Admitting: Internal Medicine

## 2018-02-23 ENCOUNTER — Encounter: Payer: Self-pay | Admitting: Internal Medicine

## 2018-02-23 VITALS — BP 118/52 | HR 64 | Ht 63.0 in | Wt 96.4 lb

## 2018-02-23 DIAGNOSIS — K30 Functional dyspepsia: Secondary | ICD-10-CM | POA: Diagnosis not present

## 2018-02-23 DIAGNOSIS — K582 Mixed irritable bowel syndrome: Secondary | ICD-10-CM

## 2018-02-23 DIAGNOSIS — K219 Gastro-esophageal reflux disease without esophagitis: Secondary | ICD-10-CM

## 2018-02-23 MED ORDER — PEPPERMINT OIL 90 MG PO CPCR: 2.0000 | ORAL_CAPSULE | ORAL | 0 refills | Status: DC

## 2018-02-23 MED ORDER — POLYETHYLENE GLYCOL 3350 17 G PO PACK: 17.0000 g | PACK | Freq: Two times a day (BID) | ORAL | 0 refills | Status: AC

## 2018-02-23 NOTE — Patient Instructions (Signed)
Increase Miralax to 17 grams twice daily.   Continue IB Gard as needed.  Continue Levsin as needed.  Continue to [place].  Continue to eat smaller, more frequent meals (around 5 times daily).  Please follow up with Amy Esterwood, PA-C in 2-3 months.  If you are age 82 or older, your body mass index should be between 23-30. Your Body mass index is 17.07 kg/m. If this is out of the aforementioned range listed, please consider follow up with your Primary Care Provider.  If you are age 43 or younger, your body mass index should be between 19-25. Your Body mass index is 17.07 kg/m. If this is out of the aformentioned range listed, please consider follow up with your Primary Care Provider.

## 2018-02-23 NOTE — Progress Notes (Signed)
 Subjective:    Patient ID: [name], female    DOB: 03/28/1932, 84 y.o.   MRN: [id number]  HPI [name] is an 84-year-old female with a history of irritable bowel, possible median arcuate ligament syndrome as suggested by prior CT angiography of the abdomen pelvis, history of prior diarrhea worsened by PPI, mixed dementia, history of multiple myeloma, hypertension, who is here for follow-up.  This is her first visit with me but she has been seeing Amy Esterwood, PA-C and was last seen on 01/14/2018.  She is here alone today.  She reports that most recently after struggling with diarrhea for some time having developed more of a constipation pattern.  She states stools can be small and hard like "rocks".  She can require straining to produce bowel movement.  She has stopped her Questran which she was using 2 mg previously for loose stool.  She is been using one full dose of MiraLAX for about a week after previously trying one half dose daily.  This is not been beneficial for her to this point.  She still is having some indigestion symptoms but has remained off of PPI due to prior diarrhea and is not taking H2 blocker therapy.  She is tried Levsin but also IBgard which she is found very helpful with intermittent abdominal discomfort and indigestion type symptoms.  She denies blood in her stool and melena  She is living in a assisted living facility and finds it easier to eat 5 small meals daily to control her abdominal symptoms but it is difficult to follow the schedule and her meals are prepared for her.  She also has had issue with spicy foods and she feels that this chef at her home uses too much pepper and spices.   Review of Systems As per HPI, otherwise negative  Current Medications, Allergies, Past Medical History, Past Surgical History, Family History and Social History were reviewed in [facility] electronic medical record.     Objective:   Physical Exam  BP (!) 118/52    Pulse 64   Ht 5' 3" (1.6 m)   Wt 96 lb 6 oz (43.7 kg)   BMI 17.07 kg/m   Gen: awake, alert, NAD HEENT: anicteric, op clear CV: RRR, no mrg Pulm: CTA b/l Abd: soft, NT/ND, +BS throughout Ext: no c/c/e Neuro: nonfocal     Assessment & Plan:  84-year-old female with a history of irritable bowel, possible median arcuate ligament syndrome as suggested by prior CT angiography of the abdomen pelvis, history of prior diarrhea worsened by PPI, mixed dementia, history of multiple myeloma, hypertension, who is here for follow-up.   1. IBS --previous issues with diarrhea but dating back also alternating bowel habit with multiple food intolerances.  PPI definitively worsened her diarrhea.  Most recently constipation predominant.  She is appropriately off of Questran in the setting of hard stool.  She is tried MiraLAX 17 g once daily and I recommended that she increase this to twice daily until stools become softer, more frequent and more easy to pass.  If stools are too loose she has been instructed to reduce to 1 dose daily.  I recommended that she continue using IBgard and Levsin per previous instruction on an as-needed basis.  Smaller more frequent meals works best for her so she should continue this schedule.  2.  GERD/indigestion --given prior PPI intolerance and loose stool and relatively low frequency symptoms without alarm, I am not going to start an   H2 blocker at present.  Can use IBgard as above.  Gaviscon can also be used as needed indigestion.  3.  Question of median arcuate ligament syndrome --no clinical symptoms of significant mesenteric arterial compromise  4.  Mixed dementia --on Zoloft and Namenda  5.  History of CAD status post CABG/status post PCI --prior NSTEMI in December 2018 on Plavix currently  6.  History of multiple myeloma --follow with hematology oncology  25 minutes spent with the patient today. Greater than 50% was spent in counseling and coordination of care with the  patient  

## 2018-03-16 DIAGNOSIS — N3941 Urge incontinence: Secondary | ICD-10-CM | POA: Diagnosis not present

## 2018-03-16 DIAGNOSIS — R159 Full incontinence of feces: Secondary | ICD-10-CM | POA: Diagnosis not present

## 2018-03-16 DIAGNOSIS — R32 Unspecified urinary incontinence: Secondary | ICD-10-CM | POA: Diagnosis not present

## 2018-03-21 NOTE — Progress Notes (Signed)
Cardiology Office Note   Date:  03/22/2018   ID:  [name], DOB Apr 12, 1932, MRN [id number]  PCP:  [place]  Cardiologist:   Minus Breeding, MD    Chief Complaint  Patient presents with  . Hypertension      History of Present Illness: [name] is a 84 y.o. female who presents of follow up of chest pain.   She has significant intolerances ot medications and she has multiple somatic complaints.     A stress test in [facility] three years ago had no ischemia and an EF of 75%.  In 2018 she had  NSTEMI and needed a PCI of a 90% circ stenosis.  She had evaluation of abdominal pain with an angiogram by Dr. Donzetta Matters.  She was not found to have any SM or celiac artery stenosis.   She has seen neurology for increased memory problems.  Since I last saw her she has continued to have somewhat labile blood pressures.  I have a blood pressure diary and I reviewed and she does start out the morning for the blood pressure typically being elevated.  However, it comes down always in the afternoon and sometimes is even quite low.  It is low today.  She gets episodes of dizziness and she is to order blood pressure is at those moments.  She gets anxious about low blood pressures and high blood pressures.  She admits to anxiety being a significant problem.  She has not had any presyncope or syncope.  She is doing better since seeing Dr. Hilarie Fredrickson and has less abdominal pain.  However, she does not feel well if she eats a large meal and she is going to start eating 5 small meals which she has done before.   Past Medical History:  Diagnosis Date  . Anxiety   . Arthritis   . CAD (coronary artery disease)   . Chondrocostal junction syndrome   . Chronic fatigue   . Dementia ([place])    without Behavioral Disturbance  . GERD (gastroesophageal reflux disease)   . Hx of CABG   . Hyperlipidemia    GOAL LDL <70  . Hypertension   . IBS (irritable bowel syndrome)   . Incontinent of  urine   . Jaundice    yellow jaundice 3rd grade  . Major depressive disorder   . Mild chronic anemia   . Mixed incontinence   . Multiple myeloma (HCC)    not having achieved remission  . NSTEMI (non-ST elevated myocardial infarction) ([place]) 01/31/2017  . Osteopenia   . Ovarian cyst, bilateral    Rt complex  . Overactive bladder   . Panic disorder   . Pulmonary nodules   . Radiculopathy   . Raynaud's syndrome   . Restless legs syndrome   . Spinal stenosis of lumbar region   . Thyroid nodule   . Unsteadiness on feet     Past Surgical History:  Procedure Laterality Date  . BUNIONECTOMY WITH HAMMERTOE RECONSTRUCTION Right 08/04/2012   Procedure:  HAMMERTOE RECONSTRUCTION;  Surgeon: Colin Rhein, MD;  Location: [place];  Service: Orthopedics;  Laterality: Right;  HAMMER TOE RECONSTRUCTION PROBABLE FLEXOR DIGITORUM LONGUS TO PROXIMAL PHALANX TRANSFER LATERALIZED   . CAPSULOTOMY Right 08/04/2012   Procedure: CAPSULOTOMY;  Surgeon: Colin Rhein, MD;  Location: [place];  Service: Orthopedics;  Laterality: Right;  RIGHT 2ND TOE METATARSOPHALANGEAL JOINT DORSAL CAPSULOTOMY   . CATARACT EXTRACTION, BILATERAL  both cataracts  . COLONOSCOPY    . CORONARY ANGIOPLASTY WITH STENT PLACEMENT  2014   2 stents  . CORONARY ARTERY BYPASS GRAFT  2007   LIMA to LAD, SVG to RCA  non-ST segment MI 08/2009 (Dr. Tamala Julian)  . CORONARY STENT INTERVENTION N/A 02/02/2017   Procedure: CORONARY STENT INTERVENTION;  Surgeon: Belva Crome, MD;  Location: [place] [facility];  Service: Cardiovascular;  Laterality: N/A;  Prox CFX  . DILATION AND CURETTAGE OF UTERUS    . LEFT HEART CATH AND CORS/GRAFTS ANGIOGRAPHY N/A 02/02/2017   Procedure: LEFT HEART CATH AND CORS/GRAFTS ANGIOGRAPHY;  Surgeon: Belva Crome, MD;  Location: [place] [facility];  Service: Cardiovascular;  Laterality: N/A;  . LEFT HEART CATHETERIZATION WITH CORONARY/GRAFT ANGIOGRAM N/A 07/09/2012   Procedure: LEFT  HEART CATHETERIZATION WITH Beatrix Fetters;  Surgeon: Sinclair Grooms, MD;  Location: [facility];  Service: Cardiovascular;  Laterality: N/A;  . TONSILLECTOMY AND ADENOIDECTOMY    . UPPER GASTROINTESTINAL ENDOSCOPY    . VISCERAL ANGIOGRAPHY N/A 04/15/2017   Procedure: VISCERAL ANGIOGRAPHY;  Surgeon: Waynetta Sandy, MD;  Location: [place] [facility];  Service: Cardiovascular;  Laterality: N/A;     Current Outpatient Medications  Medication Sig Dispense Refill  . acetaminophen (TYLENOL) 650 MG CR tablet Take 1,300 mg by mouth 2 (two) times daily.    . Alum Hydroxide-Mag Carbonate (GAVISCON EXTRA STRENGTH) [phone] MG/10ML SUSP Take by mouth. Takes 4 times daily as needed    . aspirin EC 81 MG tablet Take 1 tablet (81 mg total) by mouth daily. 90 tablet 3  . Biotin 5000 MCG TABS Take 5,000 mcg by mouth daily.    . Calcium Carb-Cholecalciferol (CALCIUM 500 + D3) 500-600 MG-UNIT TABS Take 1 tablet by mouth daily.    . carvedilol (COREG) 3.125 MG tablet Take 1 tablet (3.125 mg total) by mouth 2 (two) times daily with a meal. 60 tablet 3  . ciprofloxacin (CIPRO) 500 MG tablet Take 500 mg by mouth 2 (two) times daily.    . clopidogrel (PLAVIX) 75 MG tablet Take last dose of Brilinta 04/01/17 in evening. Take 4 tablets of Clopidogrel on 04/02/17 then take 1 tablet by mouth daily thereafter. (Patient taking differently: Take 75 mg by mouth daily. ) 94 tablet 1  . cycloSPORINE (RESTASIS) 0.05 % ophthalmic emulsion 1 drop 2 (two) times daily.    Marland Kitchen docusate sodium (COLACE) 100 MG capsule Take 100 mg by mouth daily.    . hydrALAZINE (APRESOLINE) 10 MG tablet Take 10 mg by mouth 3 (three) times daily as needed.    . hydrALAZINE (APRESOLINE) 25 MG tablet Take 25 mg by mouth as directed. If systolic > 759    . Hyoscyamine Sulfate SL (LEVSIN/SL) 0.125 MG SUBL Take 1 tablet, dissolve under the tongue every 6 hours as needed for abdominal pain. 20 each 1  . iron polysaccharides (NU-IRON) 150  MG capsule Take 150 mg by mouth 2 (two) times daily.     . isosorbide mononitrate (IMDUR) 60 MG 24 hr tablet Take 60 mg by mouth daily.    Marland Kitchen levocetirizine (XYZAL) 5 MG tablet Take 1 tablet (5 mg total) by mouth every evening. (Patient taking differently: Take 5 mg by mouth at bedtime. ) 30 tablet 5  . LORazepam (ATIVAN) 0.5 MG tablet Take 0.25-0.5 mg by mouth See admin instructions. Take 0.25 mg in the morning and 0.15m at bedtime.  May take an additional 0.5 mg twice daily as needed  for anxiety    . Melatonin 5 MG TABS Take 5 mg by mouth at bedtime.    . memantine (NAMENDA) 5 MG tablet Take 5 mg by mouth daily.    . mirabegron ER (MYRBETRIQ) 50 MG TB24 tablet Take 50 mg by mouth daily.    . montelukast (SINGULAIR) 10 MG tablet Take 10 mg by mouth at bedtime.     . Multiple Vitamins-Minerals (CENTRUM SILVER 50+WOMEN) TABS Take 1 tablet by mouth daily.    . nitroGLYCERIN (NITROSTAT) 0.4 MG SL tablet Place 1 tablet (0.4 mg total) under the tongue every 5 (five) minutes as needed for chest pain. Reported on 11/12/2015 25 tablet 2  . Peppermint Oil (IBGARD) 90 MG CPCR Take 2 capsules by mouth as directed. Per box instructions 1 capsule 0  . polyethylene glycol (MIRALAX / GLYCOLAX) packet Take 17 g by mouth 2 (two) times daily. 1 each 0  . PREMARIN vaginal cream Place 1 Applicatorful vaginally once a week.     . Probiotic Product (VSL#3 PO) Take 1 packet by mouth every morning.    . psyllium (HYDROCIL/METAMUCIL) 95 % PACK Take 1 packet by mouth daily.    . sertraline (ZOLOFT) 25 MG tablet Take 25-50 mg by mouth See admin instructions. Takes 47m po AM and 543mpo PM.      No current facility-administered medications for this visit.      ROS:  Please see the history of present illness.   Otherwise, review of systems are positive for none.   All other systems are reviewed and negative.    PHYSICAL EXAM: BP (!) 104/40 (BP Location: Left Arm, Patient Position: Sitting, Cuff Size: Normal)   Pulse 63    Ht '5\' 3"'  (1.6 m)   Wt 96 lb (43.5 kg)   BMI 17.01 kg/m  GENERAL:  Frail and thin appearing NECK:  No jugular venous distention, waveform within normal limits, carotid upstroke brisk and symmetric, no bruits, no thyromegaly LUNGS:  Clear to auscultation bilaterally CHEST:  Unremarkable HEART:  PMI not displaced or sustained,S1 and S2 within normal limits, no S3, no S4, no clicks, no rubs, no murmurs ABD:  Flat, positive bowel sounds normal in frequency in pitch, no bruits, no rebound, no guarding, no midline pulsatile mass, no hepatomegaly, no splenomegaly EXT:  2 plus pulses throughout, no edema, no cyanosis no clubbing   EKG:  EKG not ordered today.    Recent Labs: 08/29/2017: TSH 3.070 08/30/2017: Magnesium 2.2 01/18/2018: ALT 13; BUN 19; Creatinine 0.80; Hemoglobin 9.4; Platelet Count 283; Potassium 4.0; Sodium 133    Lipid Panel    Component Value Date/Time   CHOL 152 02/01/2017 0919   TRIG 41 02/01/2017 0919   HDL 75 02/01/2017 0919   CHOLHDL 2.0 02/01/2017 0919   VLDL 8 02/01/2017 0919   LDLCALC 69 02/01/2017 0919      Wt Readings from Last 3 Encounters:  03/22/18 96 lb (43.5 kg)  02/23/18 96 lb 6 oz (43.7 kg)  01/18/18 94 lb 8 oz (42.9 kg)      Other studies Reviewed: Additional studies/ records that were reviewed today include:  BP diary Review of the above records demonstrates:      ASSESSMENT AND PLAN:   Atherosclerosis of coronary artery bypass graft of native heart without angina pectoris The patient has no new sypmtoms.  No further cardiovascular testing is indicated.  We will continue with aggressive risk reduction and meds as listed.     Essential hypertension Her  blood pressure is labile.  I think she would do poorly with low blood pressures so at this point I am not going to change her meds.  I understand that she is having some high blood pressures in the morning.  She will get readings when she is having any lightheadedness to see if this  correlates with heart rate or blood pressure.   Hyperlipidemia She had an excellent lipid profile and she will continue the meds as listed.  Abdominal pain This is improved as above.  No change in therapy.   Current medicines are reviewed at length with the patient today.  The patient does not have concerns regarding medicines.  The following changes have been made:  None  Labs/ tests ordered today include: None No orders of the defined types were placed in this encounter.    Disposition:   FU with me in six months.      Signed, Minus Breeding, MD  03/22/2018 11:55 AM    [facility]

## 2018-03-22 ENCOUNTER — Encounter: Payer: Self-pay | Admitting: Cardiology

## 2018-03-22 ENCOUNTER — Ambulatory Visit: Payer: PPO | Admitting: Cardiology

## 2018-03-22 VITALS — BP 104/40 | HR 63 | Ht 63.0 in | Wt 96.0 lb

## 2018-03-22 DIAGNOSIS — E785 Hyperlipidemia, unspecified: Secondary | ICD-10-CM | POA: Diagnosis not present

## 2018-03-22 DIAGNOSIS — I10 Essential (primary) hypertension: Secondary | ICD-10-CM | POA: Diagnosis not present

## 2018-03-22 DIAGNOSIS — I2510 Atherosclerotic heart disease of native coronary artery without angina pectoris: Secondary | ICD-10-CM | POA: Diagnosis not present

## 2018-03-22 NOTE — Patient Instructions (Signed)
Medication Instructions:  Continue current medications  If you need a refill on your cardiac medications before your next appointment, please call your pharmacy.  Labwork: None Ordered   If you have labs (blood work) drawn today and your tests are completely normal, you will receive your results only by: Marland Kitchen MyChart Message (if you have MyChart) OR . A paper copy in the mail If you have any lab test that is abnormal or we need to change your treatment, we will call you to review the results.  Testing/Procedures: None Ordered  Follow-Up: You will need a follow up appointment in 6 Months.  Please call our office 2 months in advance([phone]) to schedule the appointment.  You may see  DR Percival Spanish or one of the following Advanced Practice Providers on your designated Care Team:   . Rosaria Ferries, PA-C . Jory Sims, DNP, ANP  At [facility], you and your health needs are our priority.  As part of our continuing mission to provide you with exceptional heart care, we have created designated Provider Care Teams.  These Care Teams include your primary Cardiologist (physician) and Advanced Practice Providers (APPs -  Physician Assistants and Nurse Practitioners) who all work together to provide you with the care you need, when you need it.    Thank you for choosing [facility] at [facility]!!

## 2018-04-16 ENCOUNTER — Telehealth: Payer: Self-pay | Admitting: Sports Medicine

## 2018-04-16 NOTE — Telephone Encounter (Signed)
Pt called stating she had a prescription filled from Dr. Cannon Kettle. Liquid is not working, the ointment works best for her. Can this be refilled for her?  Please call patient.

## 2018-04-16 NOTE — Telephone Encounter (Signed)
Left message informing pt the drops were the choice of the physicians for post op ingrown toenails and if she was still have problems since 02/2018 with the toenail she needed an appt.

## 2018-04-20 ENCOUNTER — Ambulatory Visit (INDEPENDENT_AMBULATORY_CARE_PROVIDER_SITE_OTHER): Payer: PPO | Admitting: Sports Medicine

## 2018-04-20 ENCOUNTER — Encounter: Payer: Self-pay | Admitting: Sports Medicine

## 2018-04-20 DIAGNOSIS — M79674 Pain in right toe(s): Secondary | ICD-10-CM

## 2018-04-20 DIAGNOSIS — M79675 Pain in left toe(s): Secondary | ICD-10-CM

## 2018-04-20 DIAGNOSIS — L600 Ingrowing nail: Secondary | ICD-10-CM | POA: Diagnosis not present

## 2018-04-20 MED ORDER — NEOMYCIN-POLYMYXIN-HC 3.5-10000-1 OT SOLN: OTIC | 0 refills | Status: DC

## 2018-04-20 NOTE — Progress Notes (Signed)
Subjective: [name] is a 84 y.o. female patient seen today in office with complaint of mildly painful thickened and elongated toenails especially at left 1st toe medial margin most painful than the right great toe medial margin; unable to trim that hurts with pressure and feels like the nail is growing back again. Patient reports that forgot all of the information I told her last visit because she suffers with dementia and thinks that she ended up spilling her Corticosporin solution and not even getting it on the toes as instructed and states that she does remember soaking for about a week after last visit.  Patient is is from [facility] and reports that if I write down orders on her paperwork that the facility will help with making sure that she gets the soaks done and the medication applied to her toes.  Patient denies any other acute symptoms at this time.  Patient Active Problem List   Diagnosis Date Noted  . Left thyroid nodule 08/29/2017  . Chronic idiopathic constipation 08/18/2017  . Abdominal pain 07/31/2017  . Mesenteric ischemia ([place]) 05/04/2017  . Unsteadiness on feet   . Unstable angina pectoris ([place])   . Tinea pedis   . Spinal stenosis of lumbar region   . Somnolence   . Seasonal allergic rhinitis   . Restless legs syndrome   . Raynaud's syndrome   . Raynauds syndrome   . Radiculopathy   . Panic disorder   . Ovarian cyst, bilateral   . Osteopenia   . Myocardial infarction ([place])   . Muscle weakness (generalized)   . Mixed incontinence   . Mild chronic anemia   . Major depressive disorder   . Long term (current) use of aspirin   . Intermediate coronary syndrome ([place])   . Incontinent of urine   . IBS (irritable bowel syndrome)   . Hypercholesteremia   . Hx of CABG   . GERD (gastroesophageal reflux disease)   . Dementia ([place])   . Chest pain   . Atherosclerotic heart disease of native coronary artery without angina pectoris   . Arthritis   . Anxiety   .  Allergic rhinitis   . Abnormal weight loss   . History of adenomatous polyp of colon 03/02/2017  . NSTEMI (non-ST elevated myocardial infarction) ([place]) 02/02/2017  . SOB (shortness of breath) 09/02/2016  . Dizziness 09/02/2016  . Non-ST elevation (NSTEMI) myocardial infarction ([place]) 11/26/2015  . Polymyalgia rheumatica ([place]) 11/26/2015  . Hypokalemia 11/26/2015  . Mild persistent asthma 11/12/2015  . Coronary artery disease involving autologous artery coronary bypass graft with angina pectoris with documented spasm ([place]) 11/12/2015  . Multiple myeloma in remission ([place]) 11/12/2015  . Current use of beta blocker 11/12/2015  . Gastroesophageal reflux disease without esophagitis 11/12/2015  . Other allergic rhinitis 11/12/2015  . Weight loss, non-intentional 10/03/2015  . Asthma 09/24/2015  . Asthma in adult without complication 44/92/0100  . Seasonal allergic rhinitis due to pollen 09/24/2015  . Cervical radiculopathy 07/25/2015  . Chronic coronary artery disease 07/25/2015  . Coronary artery disease 07/25/2015  . Disturbance in sleep behavior 07/25/2015  . Elevated CK 07/25/2015  . Elevated creatine kinase level 07/25/2015  . Generalized osteoarthritis of hand 07/25/2015  . Other long term (current) drug therapy 07/25/2015  . Overactive bladder 07/25/2015  . Restless leg 07/25/2015  . Restless leg syndrome 07/25/2015  . Sleep disturbances 07/25/2015  . Idiopathic peripheral neuropathy 07/04/2015  . Lumbar radiculopathy, chronic 07/04/2015  . Mixed dementia ([place]) 07/04/2015  .  Risk for falls 07/04/2015  . Sleep disturbance 07/04/2015  . Chest pain with moderate risk for cardiac etiology 06/13/2015  . Right sciatic nerve pain 10/19/2014  . Abnormal thyroid blood test 08/25/2014  . Chronic fatigue 08/09/2014  . Multiple myeloma ([place]) 07/21/2013  . Depression 01/14/2013  . Generalized anxiety disorder 01/14/2013  . Gastroesophageal reflux disease 01/13/2013  . Urine test  positive for microalbuminuria 12/08/2011  . Cyst of ovary 12/17/2010  . KNEE PAIN, BILATERAL 08/15/2010  . Abnormal gait 08/15/2010  . Atherosclerosis of coronary artery bypass graft 10/11/2009  . CHEST PAIN, NON-CARDIAC 10/11/2009  . HIP PAIN, RIGHT 08/13/2009  . GREATER TROCHANTERIC BURSITIS 08/13/2009  . LUMBOSACRAL SPONDYLOSIS WITHOUT MYELOPATHY 11/10/2008  . Osteoarthritis of lumbosacral spine without myelopathy 11/10/2008  . FOOT PAIN, BILATERAL 06/30/2008  . Hyperlipidemia 06/22/2008  . Hypertension 06/22/2008  . Raynaud's disease 06/22/2008  . BURSITIS, LEFT SHOULDER 06/22/2008    Current Outpatient Medications on File Prior to Visit  Medication Sig Dispense Refill  . acetaminophen (TYLENOL) 650 MG CR tablet Take 1,300 mg by mouth 2 (two) times daily.    . Alum Hydroxide-Mag Carbonate (GAVISCON EXTRA STRENGTH) [phone] MG/10ML SUSP Take by mouth. Takes 4 times daily as needed    . aspirin EC 81 MG tablet Take 1 tablet (81 mg total) by mouth daily. 90 tablet 3  . Biotin 5000 MCG TABS Take 5,000 mcg by mouth daily.    . Calcium Carb-Cholecalciferol (CALCIUM 500 + D3) 500-600 MG-UNIT TABS Take 1 tablet by mouth daily.    . carvedilol (COREG) 3.125 MG tablet Take 1 tablet (3.125 mg total) by mouth 2 (two) times daily with a meal. 60 tablet 3  . ciprofloxacin (CIPRO) 500 MG tablet Take 500 mg by mouth 2 (two) times daily.    . clopidogrel (PLAVIX) 75 MG tablet Take last dose of Brilinta 04/01/17 in evening. Take 4 tablets of Clopidogrel on 04/02/17 then take 1 tablet by mouth daily thereafter. (Patient taking differently: Take 75 mg by mouth daily. ) 94 tablet 1  . cycloSPORINE (RESTASIS) 0.05 % ophthalmic emulsion 1 drop 2 (two) times daily.    Marland Kitchen docusate sodium (COLACE) 100 MG capsule Take 100 mg by mouth daily.    . hydrALAZINE (APRESOLINE) 10 MG tablet Take 10 mg by mouth 3 (three) times daily as needed.    . hydrALAZINE (APRESOLINE) 25 MG tablet Take 25 mg by mouth as directed.  If systolic > 256    . Hyoscyamine Sulfate SL (LEVSIN/SL) 0.125 MG SUBL Take 1 tablet, dissolve under the tongue every 6 hours as needed for abdominal pain. 20 each 1  . iron polysaccharides (NU-IRON) 150 MG capsule Take 150 mg by mouth 2 (two) times daily.     . isosorbide mononitrate (IMDUR) 60 MG 24 hr tablet Take 60 mg by mouth daily.    Marland Kitchen levocetirizine (XYZAL) 5 MG tablet Take 1 tablet (5 mg total) by mouth every evening. (Patient taking differently: Take 5 mg by mouth at bedtime. ) 30 tablet 5  . LORazepam (ATIVAN) 0.5 MG tablet Take 0.25-0.5 mg by mouth See admin instructions. Take 0.25 mg in the morning and 0.46m at bedtime.  May take an additional 0.5 mg twice daily as needed for anxiety    . Melatonin 5 MG TABS Take 5 mg by mouth at bedtime.    . memantine (NAMENDA) 5 MG tablet Take 5 mg by mouth daily.    . mirabegron ER (MYRBETRIQ) 50 MG TB24 tablet Take 50  mg by mouth daily.    . montelukast (SINGULAIR) 10 MG tablet Take 10 mg by mouth at bedtime.     . Multiple Vitamins-Minerals (CENTRUM SILVER 50+WOMEN) TABS Take 1 tablet by mouth daily.    . nitroGLYCERIN (NITROSTAT) 0.4 MG SL tablet Place 1 tablet (0.4 mg total) under the tongue every 5 (five) minutes as needed for chest pain. Reported on 11/12/2015 25 tablet 2  . Peppermint Oil (IBGARD) 90 MG CPCR Take 2 capsules by mouth as directed. Per box instructions 1 capsule 0  . polyethylene glycol (MIRALAX / GLYCOLAX) packet Take 17 g by mouth 2 (two) times daily. 1 each 0  . PREMARIN vaginal cream Place 1 Applicatorful vaginally once a week.     . Probiotic Product (VSL#3 PO) Take 1 packet by mouth every morning.    . psyllium (HYDROCIL/METAMUCIL) 95 % PACK Take 1 packet by mouth daily.    . sertraline (ZOLOFT) 25 MG tablet Take 25-50 mg by mouth See admin instructions. Takes 46m po AM and 57mpo PM.      No current facility-administered medications on file prior to visit.     Allergies  Allergen Reactions  . Butazolidin  [Phenylbutazone] Swelling and Rash  . Cephalosporins Other (See Comments)    unknown  . Codeine Nausea Only  . Levaquin [Levofloxacin] Shortness Of Breath  . Macrodantin Other (See Comments)    Double vision  . Morphine And Related Other (See Comments)    hallucinations  . Trimethoprim Nausea Only  . Buprenorphine     From MAR  . Cilostazol Other (See Comments)    Stomach upset  . Dexilant [Dexlansoprazole] Diarrhea  . Diclofenac     Documented on MAR  . Erythromycin Nausea And Vomiting    All "mycin" drugs  . Lactose Intolerance ([place])   . Morphine   . Nitrofurantoin     Double vision  . Simvastatin Other (See Comments)    Muscle aches   . Sulfa Antibiotics Other (See Comments)    unknown  . Vesicare [Solifenacin] Other (See Comments)    unknown  . Clindamycin/Lincomycin Other (See Comments)    Upset stomach  . Doxycycline Diarrhea  . Food Other (See Comments)    No spicy foods or black pepper, raw onions - causes gerd  . Ibuprofen Hives and Rash  . Lincomycin Hcl Other (See Comments)    Upset stomach  . Penicillins Rash    Has patient had a PCN reaction causing immediate rash, facial/tongue/throat swelling, SOB or lightheadedness with hypotension: Yes Has patient had a PCN reaction causing severe rash involving mucus membranes or skin necrosis: No Has patient had a PCN reaction that required hospitalization No Has patient had a PCN reaction occurring within the last 10 years: No If all of the above answers are "NO", then may proceed with Cephalosporin use.   . Lisbeth PlyFesoterodine Fumarate Er] Other (See Comments)    unknown  . Voltaren [Diclofenac Sodium] Rash    Objective: Physical Exam  General: Well developed, nourished, no acute distress, awake, alert and oriented x 2  Vascular: Dorsalis pedis artery 1/4 bilateral, Posterior tibial artery 0/4 bilateral, skin temperature warm to warm proximal to distal bilateral lower extremities, mild varicosities, pedal  hair present bilateral.  Neurological: Gross sensation present via light touch bilateral.   Dermatological: Skin is warm, dry, and supple bilateral, Nails 1-10 are tender, long, thick, and discolored with mild subungal debris with mild ingrowing without infection at Left>Right medial hallux,  no webspace macerations present bilateral, no open lesions present bilateral, no callus/corns/hyperkeratotic tissue present bilateral. No signs of infection bilateral.  Musculoskeletal: Asymptomatic hallux malleous L>R boney deformities noted bilateral. Muscular strength 4/5 without painon range of motion.+ History of arthritis. No pain with calf compression bilateral.  Assessment and Plan:  Problem List Items Addressed This Visit    None    Visit Diagnoses    Ingrown nail    -  Primary   Relevant Medications   neomycin-polymyxin-hydrocortisone (CORTISPORIN) OTIC solution   Toe pain, bilateral       Relevant Medications   neomycin-polymyxin-hydrocortisone (CORTISPORIN) OTIC solution      -Examined patient.  -Discussed treatment options for painful ingrowning mycotic nails as previous. -Mechanically debrided and reduced mycotic nails with sterile nail nipper and dremel nail file without incident. -Recommend again Epsom salt soaks and corticosporin solution and advised patient to keep up with her prescription this time to see if this will give her some additional benefit -Patient to return in 2.5 months for nail trim or sooner if symptoms worsen.  Landis Martins, DPM

## 2018-04-22 ENCOUNTER — Telehealth: Payer: Self-pay | Admitting: Cardiology

## 2018-04-22 NOTE — Telephone Encounter (Signed)
New message:      Pt c/o BP issue: STAT if pt c/o blurred vision, one-sided weakness or slurred speech  1. What are your last 5 BP readings? 200/87  2. Are you having any other symptoms (ex. Dizziness, headache, blurred vision, passed out)? No  3. What is your BP issue? Pt states her BP is up but she is having no symptoms.

## 2018-04-22 NOTE — Telephone Encounter (Signed)
Returned call to patient she stated her B/P has been elevated the past 2 days.Stated she lives at [facility].Stated B/P at 7:30 am 220/87.Stated her nurse gave her extra Hydralazine 10 mg.B/P came down systolic 938.Stated she is concerned last time her B/P became elevated she had to have a heart stent.Stated she is not having chest pain.No sob.Stated she is very nervous and would like appointment to be seen.Appointment scheduled with Almyra Deforest PA 04/26/18 at 10:00 am.Advised to bring a list of all medications and B/P readings.

## 2018-04-26 ENCOUNTER — Encounter: Payer: Self-pay | Admitting: Physician Assistant

## 2018-04-26 ENCOUNTER — Ambulatory Visit (INDEPENDENT_AMBULATORY_CARE_PROVIDER_SITE_OTHER): Payer: PPO | Admitting: Physician Assistant

## 2018-04-26 VITALS — BP 118/40 | HR 56 | Ht 63.0 in | Wt 95.0 lb

## 2018-04-26 DIAGNOSIS — I25810 Atherosclerosis of coronary artery bypass graft(s) without angina pectoris: Secondary | ICD-10-CM | POA: Diagnosis not present

## 2018-04-26 DIAGNOSIS — E785 Hyperlipidemia, unspecified: Secondary | ICD-10-CM

## 2018-04-26 DIAGNOSIS — I10 Essential (primary) hypertension: Secondary | ICD-10-CM

## 2018-04-26 DIAGNOSIS — C9000 Multiple myeloma not having achieved remission: Secondary | ICD-10-CM | POA: Diagnosis not present

## 2018-04-26 MED ORDER — HYDRALAZINE HCL 25 MG PO TABS: 25.0000 mg | ORAL_TABLET | Freq: Three times a day (TID) | ORAL | 5 refills | Status: DC

## 2018-04-26 NOTE — Progress Notes (Signed)
Cardiology Office Note    Date:  04/28/2018   ID:  Latash, Nouri 1931-08-05, MRN [id number]  PCP:  [facility]  Cardiologist:  Dr. Percival Spanish  Chief Complaint  Patient presents with  . Follow-up    seen for Dr. Percival Spanish for uncontrolled BP    History of Present Illness:  [name] is a 84 y.o. female with PMH of CAD s/p CABG 2007, hypertension, hyperlipidemia, history of jaundice, multiple myeloma, panic disorder, Raynaud's syndrome, restless leg syndrome.  She had a stress test in [facility] 3 years ago that had no ischemia, EF 75%.  In 2018, she had a NSTEMI requiring PCI of 90% left circumflex stenosis.  She has known atretic/occluded LIMA to LAD, widely patent left main, chronically occluded mid RCA due to in-stent restenosis, 75% ostial diagonal jailed by a stent, patent SVG to distal RCA.  She had angiogram by vascular surgery for evaluation of abdominal pain which did not find any superior mesenteric or celiac artery stenosis.  She has also seen by neurology for increased memory problem.  Last echocardiogram obtained on 08/30/2017 showed EF 55%, no regional wall motion abnormality, mild aortic regurgitation, trivial mitral regurgitation, PA peak pressure 21 mmHg.  Patient presents today for cardiology office visit evaluation for uncontrolled high blood pressure.  Her blood pressure is quite well controlled in the office today, however she says she took additional 10 mg hydralazine this morning for systolic blood pressure over 180.  She is currently on Imdur 60 mg daily and hydralazine 25 mg twice daily with additional 10 mg hydralazine on a as needed basis for systolic blood pressure over 180.  Based on the blood pressure diary she brought with her, her systolic blood pressure has been running in the 140-180s range with occasional spike into the 200 range.  Since hydralazine is relatively short acting medication, I plan to increase her hydralazine to 25 mg 3  times a day.  Even though her blood pressure was high this morning, however with just 10 extra mg of hydralazine, her blood pressure was normalized by the time she reached the cardiology office.  I am hesitant to be too aggressive on increasing her medication.  I will bring her back in 3 to 4 weeks for reassessment.   Past Medical History:  Diagnosis Date  . Anxiety   . Arthritis   . CAD (coronary artery disease)   . Chondrocostal junction syndrome   . Chronic fatigue   . Dementia ([place])    without Behavioral Disturbance  . GERD (gastroesophageal reflux disease)   . Hx of CABG   . Hyperlipidemia    GOAL LDL <70  . Hypertension   . IBS (irritable bowel syndrome)   . Incontinent of urine   . Jaundice    yellow jaundice 3rd grade  . Major depressive disorder   . Mild chronic anemia   . Mixed incontinence   . Multiple myeloma (HCC)    not having achieved remission  . NSTEMI (non-ST elevated myocardial infarction) ([place]) 01/31/2017  . Osteopenia   . Ovarian cyst, bilateral    Rt complex  . Overactive bladder   . Panic disorder   . Pulmonary nodules   . Radiculopathy   . Raynaud's syndrome   . Restless legs syndrome   . Spinal stenosis of lumbar region   . Thyroid nodule   . Unsteadiness on feet     Past Surgical History:  Procedure Laterality Date  .  BUNIONECTOMY WITH HAMMERTOE RECONSTRUCTION Right 08/04/2012   Procedure:  HAMMERTOE RECONSTRUCTION;  Surgeon: Colin Rhein, MD;  Location: [facility];  Service: Orthopedics;  Laterality: Right;  HAMMER TOE RECONSTRUCTION PROBABLE FLEXOR DIGITORUM LONGUS TO PROXIMAL PHALANX TRANSFER LATERALIZED   . CAPSULOTOMY Right 08/04/2012   Procedure: CAPSULOTOMY;  Surgeon: Colin Rhein, MD;  Location: [facility];  Service: Orthopedics;  Laterality: Right;  RIGHT 2ND TOE METATARSOPHALANGEAL JOINT DORSAL CAPSULOTOMY   . CATARACT EXTRACTION, BILATERAL     both cataracts  . COLONOSCOPY    . CORONARY ANGIOPLASTY  WITH STENT PLACEMENT  2014   2 stents  . CORONARY ARTERY BYPASS GRAFT  2007   LIMA to LAD, SVG to RCA  non-ST segment MI 08/2009 (Dr. Tamala Julian)  . CORONARY STENT INTERVENTION N/A 02/02/2017   Procedure: CORONARY STENT INTERVENTION;  Surgeon: Belva Crome, MD;  Location: [facility];  Service: Cardiovascular;  Laterality: N/A;  Prox CFX  . DILATION AND CURETTAGE OF UTERUS    . LEFT HEART CATH AND CORS/GRAFTS ANGIOGRAPHY N/A 02/02/2017   Procedure: LEFT HEART CATH AND CORS/GRAFTS ANGIOGRAPHY;  Surgeon: Belva Crome, MD;  Location: [facility];  Service: Cardiovascular;  Laterality: N/A;  . LEFT HEART CATHETERIZATION WITH CORONARY/GRAFT ANGIOGRAM N/A 07/09/2012   Procedure: LEFT HEART CATHETERIZATION WITH Beatrix Fetters;  Surgeon: Sinclair Grooms, MD;  Location: [facility];  Service: Cardiovascular;  Laterality: N/A;  . TONSILLECTOMY AND ADENOIDECTOMY    . UPPER GASTROINTESTINAL ENDOSCOPY    . VISCERAL ANGIOGRAPHY N/A 04/15/2017   Procedure: VISCERAL ANGIOGRAPHY;  Surgeon: Waynetta Sandy, MD;  Location: [facility];  Service: Cardiovascular;  Laterality: N/A;    Current Medications: Outpatient Medications Prior to Visit  Medication Sig Dispense Refill  . acetaminophen (TYLENOL) 650 MG CR tablet Take 1,300 mg by mouth 2 (two) times daily.    . Alum Hydroxide-Mag Carbonate (GAVISCON EXTRA STRENGTH) [phone] MG/10ML SUSP Take by mouth. Takes 4 times daily as needed    . aspirin EC 81 MG tablet Take 1 tablet (81 mg total) by mouth daily. 90 tablet 3  . Biotin 5000 MCG TABS Take 5,000 mcg by mouth daily.    . Calcium Carb-Cholecalciferol (CALCIUM 500 + D3) 500-600 MG-UNIT TABS Take 1 tablet by mouth daily.    . carvedilol (COREG) 3.125 MG tablet Take 1 tablet (3.125 mg total) by mouth 2 (two) times daily with a meal. 60 tablet 3  . clopidogrel (PLAVIX) 75 MG tablet Take last dose of Brilinta 04/01/17 in evening. Take 4 tablets of Clopidogrel on 04/02/17 then  take 1 tablet by mouth daily thereafter. (Patient taking differently: Take 75 mg by mouth daily. ) 94 tablet 1  . cycloSPORINE (RESTASIS) 0.05 % ophthalmic emulsion 1 drop 2 (two) times daily.    Marland Kitchen docusate sodium (COLACE) 100 MG capsule Take 100 mg by mouth daily.    . hydrALAZINE (APRESOLINE) 10 MG tablet Take 10 mg by mouth 3 (three) times daily as needed.    Marland Kitchen Hyoscyamine Sulfate SL (LEVSIN/SL) 0.125 MG SUBL Take 1 tablet, dissolve under the tongue every 6 hours as needed for abdominal pain. 20 each 1  . iron polysaccharides (NU-IRON) 150 MG capsule Take 150 mg by mouth 2 (two) times daily.     . isosorbide mononitrate (IMDUR) 60 MG 24 hr tablet Take 60 mg by mouth daily.    Marland Kitchen levocetirizine (XYZAL) 5 MG tablet Take 1 tablet (5 mg total)  by mouth every evening. (Patient taking differently: Take 5 mg by mouth at bedtime. ) 30 tablet 5  . LORazepam (ATIVAN) 0.5 MG tablet Take 0.25-0.5 mg by mouth See admin instructions. Take 0.25 mg in the morning and 0.63m at bedtime.  May take an additional 0.5 mg twice daily as needed for anxiety    . Melatonin 5 MG TABS Take 5 mg by mouth at bedtime.    . memantine (NAMENDA) 5 MG tablet Take 5 mg by mouth daily.    . mirabegron ER (MYRBETRIQ) 50 MG TB24 tablet Take 50 mg by mouth daily.    . montelukast (SINGULAIR) 10 MG tablet Take 10 mg by mouth at bedtime.     . Multiple Vitamins-Minerals (CENTRUM SILVER 50+WOMEN) TABS Take 1 tablet by mouth daily.    . nitroGLYCERIN (NITROSTAT) 0.4 MG SL tablet Place 1 tablet (0.4 mg total) under the tongue every 5 (five) minutes as needed for chest pain. Reported on 11/12/2015 25 tablet 2  . Peppermint Oil (IBGARD) 90 MG CPCR Take 2 capsules by mouth as directed. Per box instructions 1 capsule 0  . polyethylene glycol (MIRALAX / GLYCOLAX) packet Take 17 g by mouth 2 (two) times daily. 1 each 0  . PREMARIN vaginal cream Place 1 Applicatorful vaginally once a week.     . Probiotic Product (VSL#3 PO) Take 1 packet by mouth  every morning.    . psyllium (HYDROCIL/METAMUCIL) 95 % PACK Take 1 packet by mouth daily.    . sertraline (ZOLOFT) 25 MG tablet Take 25-50 mg by mouth See admin instructions. Takes 293mpo AM and 5067mo PM.     . hydrALAZINE (APRESOLINE) 25 MG tablet Take 25 mg by mouth as directed. If systolic > 180381 . neomycin-polymyxin-hydrocortisone (CORTISPORIN) OTIC solution Apply 1-2 drops to both toes once daily until all used (Patient not taking: Reported on 04/26/2018) 10 mL 0  . ciprofloxacin (CIPRO) 500 MG tablet Take 500 mg by mouth 2 (two) times daily.     No facility-administered medications prior to visit.      Allergies:   Butazolidin [phenylbutazone]; Cephalosporins; Codeine; Levaquin [levofloxacin]; Macrodantin; Morphine and related; Trimethoprim; Buprenorphine; Cilostazol; Dexilant [dexlansoprazole]; Diclofenac; Erythromycin; Lactose intolerance (gi); Morphine; Nitrofurantoin; Simvastatin; Sulfa antibiotics; Vesicare [solifenacin]; Clindamycin/lincomycin; Doxycycline; Food; Ibuprofen; Lincomycin hcl; Penicillins; Toviaz [fesoterodine fumarate er]; and Voltaren [diclofenac sodium]   Social History   Socioeconomic History  . Marital status: Married    Spouse name: Not on file  . Number of children: 4  . Years of education: Not on file  . Highest education level: Not on file  Occupational History  . Occupation: homemaker   . Occupation: dirMaterials engineerucation  Social Needs  . Financial resource strain: Not on file  . Food insecurity:    Worry: Not on file    Inability: Not on file  . Transportation needs:    Medical: Not on file    Non-medical: Not on file  Tobacco Use  . Smoking status: Never Smoker  . Smokeless tobacco: Never Used  Substance and Sexual Activity  . Alcohol use: Never    Frequency: Never  . Drug use: No  . Sexual activity: Never  Lifestyle  . Physical activity:    Days per week: Not on file    Minutes per session: Not on file  . Stress: Not on file   Relationships  . Social connections:    Talks on phone: Not on file    Gets together:  Not on file    Attends religious service: Not on file    Active member of club or organization: Not on file    Attends meetings of clubs or organizations: Not on file    Relationship status: Not on file  Other Topics Concern  . Not on file  Social History Narrative  . Not on file     Family History:  The patient's family history includes Breast cancer in her paternal aunt; CAD in her mother; Heart attack in her mother; Obesity in her sister; Ulcers in her father.   ROS:   Please see the history of present illness.    ROS All other systems reviewed and are negative.   PHYSICAL EXAM:   VS:  BP (!) 118/40 Comment: has taken both doses of hydrazlazine  Pulse (!) 56   Ht '5\' 3"'  (1.6 m)   Wt 95 lb (43.1 kg)   SpO2 98%   BMI 16.83 kg/m    GEN: Well nourished, well developed, in no acute distress  HEENT: normal  Neck: no JVD, carotid bruits, or masses Cardiac: RRR; no murmurs, rubs, or gallops,no edema  Respiratory:  clear to auscultation bilaterally, normal work of breathing GI: soft, nontender, nondistended, + BS MS: no deformity or atrophy  Skin: warm and dry, no rash Neuro:  Alert and Oriented x 3, Strength and sensation are intact Psych: euthymic mood, full affect  Wt Readings from Last 3 Encounters:  04/26/18 95 lb (43.1 kg)  03/22/18 96 lb (43.5 kg)  02/23/18 96 lb 6 oz (43.7 kg)      Studies/Labs Reviewed:   EKG:  EKG is not ordered today.    Recent Labs: 08/29/2017: TSH 3.070 08/30/2017: Magnesium 2.2 01/18/2018: ALT 13; BUN 19; Creatinine 0.80; Hemoglobin 9.4; Platelet Count 283; Potassium 4.0; Sodium 133   Lipid Panel    Component Value Date/Time   CHOL 152 02/01/2017 0919   TRIG 41 02/01/2017 0919   HDL 75 02/01/2017 0919   CHOLHDL 2.0 02/01/2017 0919   VLDL 8 02/01/2017 0919   LDLCALC 69 02/01/2017 0919    Additional studies/ records that were reviewed today  include:   Cath 02/02/2017  Presentation with acute coronary syndrome, with symptoms that are atypical.  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  Widely patent left main  De novo 90% stenosis in the proximal to mid circumflex since the last catheterization in 2014.Marland Kitchen  Chronic occlusion of the mid right coronary due to in-stent restenosis  Patent LAD with 30-40% diffuse ISR in a previously placed stent. 75% ostial stenosis of the diagonal jailed by the stent.  Patent saphenous vein graft to the distal right coronary. The proximal third of the vein graft contains up to 50% diffuse obstructive disease. Unchanged from 2014 angiogram.  Successful drug-eluting stent procedure on the 90% circumflex stenosis which was reduced to 0% with TIMI grade 3 flow using a 2.25 x 12 Xience Sierra DES postdilated to 2.5 mm in diameter.  RECOMMENDATIONS:   Dual antiplatelet therapy for 12 months  Risk factor modification including LDL less than 70.   Echo 08/30/2017 LV EF: 55% Study Conclusions  - Left ventricle: The cavity size was normal. Wall thickness was   normal. The estimated ejection fraction was 55%. Wall motion was   normal; there were no regional wall motion abnormalities. The   study is not technically sufficient to allow evaluation of LV   diastolic function. - Aortic valve: Mildly calcified annulus. There was mild  regurgitation. - Mitral valve: Mildly calcified annulus. There was trivial   regurgitation. - Right atrium: Central venous pressure (est): 3 mm Hg. - Atrial septum: No defect or patent foramen ovale was identified. - Tricuspid valve: There was mild regurgitation. - Pulmonary arteries: PA peak pressure: 21 mm Hg (S). - Pericardium, extracardiac: There was no pericardial effusion.    ASSESSMENT:    1. Uncontrolled hypertension   2. Coronary artery disease involving coronary bypass graft of native heart without angina pectoris   3. Hyperlipidemia, unspecified  hyperlipidemia type   4. Multiple myeloma, remission status unspecified (HCC)      PLAN:  In order of problems listed above:  1. Uncontrolled hypertension: Her blood pressure has been spiking to the 200 range.  Even though her blood pressure in the office was normal.  She has taken extra dose of hydralazine this morning.  I will increase her hydralazine to 25 mg 3 times daily.  She may still use 10 mg as needed dose of hydralazine for systolic blood pressure over 160.  I will bring her back in 3 weeks for reassessment  2. CAD s/p CABG: On aspirin and Plavix.  Denies any recent chest pain.  3. Hyperlipidemia: Even though she carries a diagnosis of hyperlipidemia, she is not on any statin.  She will need to repeat fasting lipid panel.  4. Multiple myeloma: Followed by oncology    Medication Adjustments/Labs and Tests Ordered: Current medicines are reviewed at length with the patient today.  Concerns regarding medicines are outlined above.  Medication changes, Labs and Tests ordered today are listed in the Patient Instructions below. Patient Instructions  Medication Instructions:  INCREASE Hydralazine to one tablet three times a day If you need a refill on your cardiac medications before your next appointment, please call your pharmacy.   Lab work: None  If you have labs (blood work) drawn today and your tests are completely normal, you will receive your results only by: Marland Kitchen MyChart Message (if you have MyChart) OR . A paper copy in the mail If you have any lab test that is abnormal or we need to change your treatment, we will call you to review the results.  Testing/Procedures: None   Follow-Up: At [facility], you and your health needs are our priority.  As part of our continuing mission to provide you with exceptional heart care, we have created designated Provider Care Teams.  These Care Teams include your primary Cardiologist (physician) and Advanced Practice Providers (APPs -   Physician Assistants and Nurse Practitioners) who all work together to provide you with the care you need, when you need it. . Your physician recommends that you schedule a follow-up appointment in: 3-4 weeks with Almyra Deforest, PA-C  Any Other Special Instructions Will Be Listed Below (If Applicable). Check your blood pressure every day     Hilbert Corrigan, [place]  04/28/2018 8:54 AM    [address], [place], [place]  [address] Phone: [phone]; Fax: [phone]

## 2018-04-26 NOTE — Patient Instructions (Addendum)
Medication Instructions:  INCREASE Hydralazine to one tablet three times a day If you need a refill on your cardiac medications before your next appointment, please call your pharmacy.   Lab work: None  If you have labs (blood work) drawn today and your tests are completely normal, you will receive your results only by: Marland Kitchen MyChart Message (if you have MyChart) OR . A paper copy in the mail If you have any lab test that is abnormal or we need to change your treatment, we will call you to review the results.  Testing/Procedures: None   Follow-Up: At [facility], you and your health needs are our priority.  As part of our continuing mission to provide you with exceptional heart care, we have created designated Provider Care Teams.  These Care Teams include your primary Cardiologist (physician) and Advanced Practice Providers (APPs -  Physician Assistants and Nurse Practitioners) who all work together to provide you with the care you need, when you need it. . Your physician recommends that you schedule a follow-up appointment in: 3-4 weeks with Almyra Deforest, PA-C  Any Other Special Instructions Will Be Listed Below (If Applicable). Check your blood pressure every day

## 2018-04-27 DIAGNOSIS — R8290 Unspecified abnormal findings in urine: Secondary | ICD-10-CM | POA: Diagnosis not present

## 2018-04-27 DIAGNOSIS — N3281 Overactive bladder: Secondary | ICD-10-CM | POA: Diagnosis not present

## 2018-04-27 DIAGNOSIS — R32 Unspecified urinary incontinence: Secondary | ICD-10-CM | POA: Diagnosis not present

## 2018-04-27 DIAGNOSIS — R159 Full incontinence of feces: Secondary | ICD-10-CM | POA: Diagnosis not present

## 2018-04-28 ENCOUNTER — Emergency Department (HOSPITAL_COMMUNITY): Payer: PPO

## 2018-04-28 ENCOUNTER — Emergency Department (HOSPITAL_COMMUNITY)
Admission: EM | Admit: 2018-04-28 | Discharge: 2018-04-28 | Disposition: A | Discharge status: Home / Self Care | Payer: PPO | Attending: Emergency Medicine | Admitting: Emergency Medicine

## 2018-04-28 ENCOUNTER — Encounter (HOSPITAL_COMMUNITY): Payer: Self-pay | Admitting: Emergency Medicine

## 2018-04-28 ENCOUNTER — Other Ambulatory Visit: Payer: Self-pay

## 2018-04-28 ENCOUNTER — Encounter: Payer: Self-pay | Admitting: Physician Assistant

## 2018-04-28 DIAGNOSIS — Y929 Unspecified place or not applicable: Secondary | ICD-10-CM | POA: Diagnosis not present

## 2018-04-28 DIAGNOSIS — Y939 Activity, unspecified: Secondary | ICD-10-CM | POA: Diagnosis not present

## 2018-04-28 DIAGNOSIS — F0390 Unspecified dementia without behavioral disturbance: Secondary | ICD-10-CM | POA: Insufficient documentation

## 2018-04-28 DIAGNOSIS — J45909 Unspecified asthma, uncomplicated: Secondary | ICD-10-CM | POA: Diagnosis not present

## 2018-04-28 DIAGNOSIS — S299XXA Unspecified injury of thorax, initial encounter: Secondary | ICD-10-CM | POA: Diagnosis not present

## 2018-04-28 DIAGNOSIS — S60011A Contusion of right thumb without damage to nail, initial encounter: Secondary | ICD-10-CM | POA: Diagnosis not present

## 2018-04-28 DIAGNOSIS — S6992XA Unspecified injury of left wrist, hand and finger(s), initial encounter: Secondary | ICD-10-CM | POA: Diagnosis not present

## 2018-04-28 DIAGNOSIS — W19XXXA Unspecified fall, initial encounter: Secondary | ICD-10-CM | POA: Diagnosis not present

## 2018-04-28 DIAGNOSIS — R0781 Pleurodynia: Secondary | ICD-10-CM | POA: Diagnosis not present

## 2018-04-28 DIAGNOSIS — M25552 Pain in left hip: Secondary | ICD-10-CM | POA: Diagnosis not present

## 2018-04-28 DIAGNOSIS — Y999 Unspecified external cause status: Secondary | ICD-10-CM | POA: Diagnosis not present

## 2018-04-28 DIAGNOSIS — M7989 Other specified soft tissue disorders: Secondary | ICD-10-CM | POA: Diagnosis not present

## 2018-04-28 DIAGNOSIS — S199XXA Unspecified injury of neck, initial encounter: Secondary | ICD-10-CM | POA: Diagnosis not present

## 2018-04-28 DIAGNOSIS — I10 Essential (primary) hypertension: Secondary | ICD-10-CM | POA: Diagnosis not present

## 2018-04-28 DIAGNOSIS — Z79899 Other long term (current) drug therapy: Secondary | ICD-10-CM | POA: Diagnosis not present

## 2018-04-28 DIAGNOSIS — R609 Edema, unspecified: Secondary | ICD-10-CM | POA: Diagnosis not present

## 2018-04-28 DIAGNOSIS — S0990XA Unspecified injury of head, initial encounter: Secondary | ICD-10-CM | POA: Diagnosis not present

## 2018-04-28 DIAGNOSIS — M79641 Pain in right hand: Secondary | ICD-10-CM | POA: Diagnosis not present

## 2018-04-28 DIAGNOSIS — M79642 Pain in left hand: Secondary | ICD-10-CM | POA: Diagnosis not present

## 2018-04-28 DIAGNOSIS — S79912A Unspecified injury of left hip, initial encounter: Secondary | ICD-10-CM | POA: Diagnosis not present

## 2018-04-28 DIAGNOSIS — S6991XA Unspecified injury of right wrist, hand and finger(s), initial encounter: Secondary | ICD-10-CM | POA: Diagnosis not present

## 2018-04-28 MED ORDER — ACETAMINOPHEN 500 MG PO TABS
1000.0000 mg | ORAL_TABLET | Freq: Once | ORAL | Status: AC
  Administered 2018-04-28: 1000 mg via ORAL
  Filled 2018-04-28: qty 2

## 2018-04-28 NOTE — ED Provider Notes (Signed)
[facility] EMERGENCY DEPARTMENT Provider Note   CSN: [id number] Arrival date & time: 04/28/18  1148     History   Chief Complaint Chief Complaint  Patient presents with  . Fall    HPI [name] is a 84 y.o. female.  The history is provided by the patient.  Fall  This is a new problem. The current episode started 1 to 2 hours ago. The problem occurs constantly. The problem has not changed since onset.Associated symptoms include chest pain (left rib pain ). Pertinent negatives include no abdominal pain, no headaches and no shortness of breath. Associated symptoms comments: Patient hit her head, no LOC, pain to right thumb with bruising and swelling, no hip pain, was ambulatory after the fall. Nothing aggravates the symptoms. Nothing relieves the symptoms. She has tried a cold compress for the symptoms. The treatment provided mild relief.    Past Medical History:  Diagnosis Date  . Anxiety   . Arthritis   . CAD (coronary artery disease)   . Chondrocostal junction syndrome   . Chronic fatigue   . Dementia ([place])    without Behavioral Disturbance  . GERD (gastroesophageal reflux disease)   . Hx of CABG   . Hyperlipidemia    GOAL LDL <70  . Hypertension   . IBS (irritable bowel syndrome)   . Incontinent of urine   . Jaundice    yellow jaundice 3rd grade  . Major depressive disorder   . Mild chronic anemia   . Mixed incontinence   . Multiple myeloma (HCC)    not having achieved remission  . NSTEMI (non-ST elevated myocardial infarction) ([place]) 01/31/2017  . Osteopenia   . Ovarian cyst, bilateral    Rt complex  . Overactive bladder   . Panic disorder   . Pulmonary nodules   . Radiculopathy   . Raynaud's syndrome   . Restless legs syndrome   . Spinal stenosis of lumbar region   . Thyroid nodule   . Unsteadiness on feet     Patient Active Problem List   Diagnosis Date Noted  . Left thyroid nodule 08/29/2017  . Chronic idiopathic  constipation 08/18/2017  . Abdominal pain 07/31/2017  . Mesenteric ischemia ([place]) 05/04/2017  . Unsteadiness on feet   . Unstable angina pectoris ([place])   . Tinea pedis   . Spinal stenosis of lumbar region   . Somnolence   . Seasonal allergic rhinitis   . Restless legs syndrome   . Raynaud's syndrome   . Raynauds syndrome   . Radiculopathy   . Panic disorder   . Ovarian cyst, bilateral   . Osteopenia   . Myocardial infarction ([place])   . Muscle weakness (generalized)   . Mixed incontinence   . Mild chronic anemia   . Major depressive disorder   . Long term (current) use of aspirin   . Intermediate coronary syndrome ([place])   . Incontinent of urine   . IBS (irritable bowel syndrome)   . Hypercholesteremia   . Hx of CABG   . GERD (gastroesophageal reflux disease)   . Dementia ([place])   . Chest pain   . Atherosclerotic heart disease of native coronary artery without angina pectoris   . Arthritis   . Anxiety   . Allergic rhinitis   . Abnormal weight loss   . History of adenomatous polyp of colon 03/02/2017  . NSTEMI (non-ST elevated myocardial infarction) ([place]) 02/02/2017  . SOB (shortness of breath) 09/02/2016  . Dizziness  09/02/2016  . Non-ST elevation (NSTEMI) myocardial infarction ([place]) 11/26/2015  . Polymyalgia rheumatica ([place]) 11/26/2015  . Hypokalemia 11/26/2015  . Mild persistent asthma 11/12/2015  . Coronary artery disease involving autologous artery coronary bypass graft with angina pectoris with documented spasm ([place]) 11/12/2015  . Multiple myeloma in remission ([place]) 11/12/2015  . Current use of beta blocker 11/12/2015  . Gastroesophageal reflux disease without esophagitis 11/12/2015  . Other allergic rhinitis 11/12/2015  . Weight loss, non-intentional 10/03/2015  . Asthma 09/24/2015  . Asthma in adult without complication 26/83/4196  . Seasonal allergic rhinitis due to pollen 09/24/2015  . Cervical radiculopathy 07/25/2015  . Chronic coronary artery disease  07/25/2015  . Coronary artery disease 07/25/2015  . Disturbance in sleep behavior 07/25/2015  . Elevated CK 07/25/2015  . Elevated creatine kinase level 07/25/2015  . Generalized osteoarthritis of hand 07/25/2015  . Other long term (current) drug therapy 07/25/2015  . Overactive bladder 07/25/2015  . Restless leg 07/25/2015  . Restless leg syndrome 07/25/2015  . Sleep disturbances 07/25/2015  . Idiopathic peripheral neuropathy 07/04/2015  . Lumbar radiculopathy, chronic 07/04/2015  . 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 Risk for falls 07/04/2015  . Sleep disturbance 07/04/2015  . Chest pain with moderate risk for cardiac etiology 06/13/2015  . Right sciatic nerve pain 10/19/2014  . Abnormal thyroid blood test 08/25/2014  . Chronic fatigue 08/09/2014  . Multiple myeloma ([place]) 07/21/2013  . Depression 01/14/2013  . Generalized anxiety disorder 01/14/2013  . Gastroesophageal reflux disease 01/13/2013  . Urine test positive for microalbuminuria 12/08/2011  . Cyst of ovary 12/17/2010  . KNEE PAIN, BILATERAL 08/15/2010  . Abnormal gait 08/15/2010  . Atherosclerosis of coronary artery bypass graft 10/11/2009  . CHEST PAIN, NON-CARDIAC 10/11/2009  . HIP PAIN, RIGHT 08/13/2009  . GREATER TROCHANTERIC BURSITIS 08/13/2009  . LUMBOSACRAL SPONDYLOSIS WITHOUT MYELOPATHY 11/10/2008  . Osteoarthritis of lumbosacral spine without myelopathy 11/10/2008  . FOOT PAIN, BILATERAL 06/30/2008  . Hyperlipidemia 06/22/2008  . Hypertension 06/22/2008  . Raynaud's disease 06/22/2008  . BURSITIS, LEFT SHOULDER 06/22/2008    Past Surgical History:  Procedure Laterality Date  . BUNIONECTOMY WITH HAMMERTOE RECONSTRUCTION Right 08/04/2012   Procedure:  HAMMERTOE RECONSTRUCTION;  Surgeon: Colin Rhein, MD;  Location: [facility];  Service: Orthopedics;  Laterality: Right;  HAMMER TOE RECONSTRUCTION PROBABLE FLEXOR DIGITORUM LONGUS TO PROXIMAL PHALANX TRANSFER LATERALIZED   . CAPSULOTOMY Right  08/04/2012   Procedure: CAPSULOTOMY;  Surgeon: Colin Rhein, MD;  Location: [facility];  Service: Orthopedics;  Laterality: Right;  RIGHT 2ND TOE METATARSOPHALANGEAL JOINT DORSAL CAPSULOTOMY   . CATARACT EXTRACTION, BILATERAL     both cataracts  . COLONOSCOPY    . CORONARY ANGIOPLASTY WITH STENT PLACEMENT  2014   2 stents  . CORONARY ARTERY BYPASS GRAFT  2007   LIMA to LAD, SVG to RCA  non-ST segment MI 08/2009 (Dr. Tamala Julian)  . CORONARY STENT INTERVENTION N/A 02/02/2017   Procedure: CORONARY STENT INTERVENTION;  Surgeon: Belva Crome, MD;  Location: [facility];  Service: Cardiovascular;  Laterality: N/A;  Prox CFX  . DILATION AND CURETTAGE OF UTERUS    . LEFT HEART CATH AND CORS/GRAFTS ANGIOGRAPHY N/A 02/02/2017   Procedure: LEFT HEART CATH AND CORS/GRAFTS ANGIOGRAPHY;  Surgeon: Belva Crome, MD;  Location: [facility];  Service: Cardiovascular;  Laterality: N/A;  . LEFT HEART CATHETERIZATION WITH CORONARY/GRAFT ANGIOGRAM N/A 07/09/2012   Procedure: LEFT HEART CATHETERIZATION WITH Beatrix Fetters;  Surgeon: Belva Crome  III, MD;  Location: [facility] C[facility] LAB;  Service: Cardiovascular;  Laterality: N/A;  . TONSILLECTOMY AND ADENOIDECTOMY    . UPPER GASTROINTESTINAL ENDOSCOPY    . VISCERAL ANGIOGRAPHY N/A 04/15/2017   Procedure: VISCERAL ANGIOGRAPHY;  Surgeon: Waynetta Sandy, MD;  Location: [facility];  Service: Cardiovascular;  Laterality: N/A;     OB History   None      Home Medications    Prior to Admission medications   Medication Sig Start Date End Date Taking? Authorizing Provider  acetaminophen (TYLENOL) 650 MG CR tablet Take 1,300 mg by mouth 2 (two) times daily.    [provider]  Alum Hydroxide-Mag Carbonate (GAVISCON EXTRA STRENGTH) [phone] MG/10ML SUSP Take by mouth. Takes 4 times daily as needed    [provider]  aspirin EC 81 MG tablet Take 1 tablet (81 mg total) by mouth daily. 01/21/16   Minus Breeding, MD  Biotin 5000 MCG TABS Take 5,000 mcg by mouth daily.    [provider]  Calcium Carb-Cholecalciferol (CALCIUM 500 + D3) 500-600 MG-UNIT TABS Take 1 tablet by mouth daily.    [provider]  carvedilol (COREG) 3.125 MG tablet Take 1 tablet (3.125 mg total) by mouth 2 (two) times daily with a meal. 01/05/18   Minus Breeding, MD  clopidogrel (PLAVIX) 75 MG tablet Take last dose of Brilinta 04/01/17 in evening. Take 4 tablets of Clopidogrel on 04/02/17 then take 1 tablet by mouth daily thereafter. Patient taking differently: Take 75 mg by mouth daily.  04/01/17   Minus Breeding, MD  cycloSPORINE (RESTASIS) 0.05 % ophthalmic emulsion 1 drop 2 (two) times daily.    [provider]  docusate sodium (COLACE) 100 MG capsule Take 100 mg by mouth daily.    [provider]  hydrALAZINE (APRESOLINE) 10 MG tablet Take 10 mg by mouth 3 (three) times daily as needed.    [provider]  hydrALAZINE (APRESOLINE) 25 MG tablet Take 1 tablet (25 mg total) by mouth 3 (three) times daily. 04/26/18   Almyra Deforest, PA  Hyoscyamine Sulfate SL (LEVSIN/SL) 0.125 MG SUBL Take 1 tablet, dissolve under the tongue every 6 hours as needed for abdominal pain. 09/09/17   Esterwood, Amy S, PA-C  iron polysaccharides (NU-IRON) 150 MG capsule Take 150 mg by mouth 2 (two) times daily.     [provider]  isosorbide mononitrate (IMDUR) 60 MG 24 hr tablet Take 60 mg by mouth daily.    [provider]  levocetirizine (XYZAL) 5 MG tablet Take 1 tablet (5 mg total) by mouth every evening. Patient taking differently: Take 5 mg by mouth at bedtime.  05/13/17   Charlies Silvers, MD  LORazepam (ATIVAN) 0.5 MG tablet Take 0.25-0.5 mg by mouth See admin instructions. Take 0.25 mg in the morning and 0.101m at bedtime.  May take an additional 0.5 mg twice daily as needed for anxiety 01/09/17   [provider]  Melatonin 5 MG TABS Take 5 mg by mouth at bedtime.    [provider]  memantine (NAMENDA) 5 MG tablet Take 5 mg by mouth daily.    [provider]  mirabegron ER (MYRBETRIQ) 50 MG TB24 tablet Take 50 mg by mouth daily.    [provider]  montelukast (SINGULAIR) 10 MG tablet Take 10 mg by mouth at bedtime.     [provider]  Multiple Vitamins-Minerals (CENTRUM SILVER 50+WOMEN) TABS Take 1 tablet by mouth daily.    [provider]  neomycin-polymyxin-hydrocortisone (CORTISPORIN) OTIC solution Apply 1-2 drops to both toes once daily until all used Patient not taking: Reported on 04/26/2018 04/20/18   Landis Martins, DPM  nitroGLYCERIN (NITROSTAT) 0.4 MG SL tablet Place 1 tablet (0.4 mg total) under the tongue every 5 (five) minutes as needed for chest pain. Reported on 11/12/2015 02/03/17   Lyda Jester M, PA-C  Peppermint Oil (IBGARD) 90 MG CPCR Take 2 capsules by mouth as directed. Per box instructions 02/23/18   Pyrtle, Lajuan Lines, MD  polyethylene glycol (MIRALAX / GLYCOLAX) packet Take 17 g by mouth 2 (two) times daily. 02/23/18   Pyrtle, Lajuan Lines, MD  PREMARIN vaginal cream Place 1 Applicatorful vaginally once a week.  05/05/17   [provider]  Probiotic Product (VSL#3 PO) Take 1 packet by mouth every morning.    [provider]  psyllium (HYDROCIL/METAMUCIL) 95 % PACK Take 1 packet by mouth daily.    [provider]  sertraline (ZOLOFT) 25 MG tablet Take 25-50 mg by mouth See admin instructions. Takes 107m po AM and 549mpo PM.     [provider]    Family History Family History  Problem Relation Age of Onset  . Heart attack Mother   . CAD Mother   . Obesity Sister   . Breast cancer Paternal Aunt   . Ulcers Father   . Asthma Neg Hx   . Eczema Neg Hx   . Urticaria Neg Hx   . Allergic rhinitis Neg Hx   . Angioedema Neg Hx   . Atopy Neg Hx   . Immunodeficiency Neg Hx     Social History Social History   Tobacco Use  . Smoking status: Never Smoker  . Smokeless  tobacco: Never Used  Substance Use Topics  . Alcohol use: Never    Frequency: Never  . Drug use: No     Allergies   Butazolidin [phenylbutazone]; Cephalosporins; Codeine; Levaquin [levofloxacin]; Macrodantin; Morphine and related; Trimethoprim; Buprenorphine; Cilostazol; Dexilant [dexlansoprazole]; Diclofenac; Erythromycin; Lactose intolerance (gi); Morphine; Nitrofurantoin; Simvastatin; Sulfa antibiotics; Vesicare [solifenacin]; Clindamycin/lincomycin; Doxycycline; Food; Ibuprofen; Lincomycin hcl; Penicillins; Toviaz [fesoterodine fumarate er]; and Voltaren [diclofenac sodium]   Review of Systems Review of Systems  Constitutional: Negative for chills and fever.  HENT: Negative for ear pain and sore throat.   Eyes: Negative for pain and visual disturbance.  Respiratory: Negative for cough and shortness of breath.   Cardiovascular: Positive for chest pain (left rib pain ). Negative for palpitations.  Gastrointestinal: Negative for abdominal pain and vomiting.  Genitourinary: Negative for dysuria and hematuria.  Musculoskeletal: Positive for arthralgias and joint swelling. Negative for back pain, neck pain and neck stiffness.  Skin: Positive for color change. Negative for rash.  Neurological: Negative for seizures, syncope and headaches.  All other systems reviewed and are negative.    Physical Exam Updated Vital Signs  ED Triage Vitals  Enc Vitals Group     BP 04/28/18 1202 (!) 161/60     Pulse Rate 04/28/18 1202 67     Resp 04/28/18 1202 15     Temp 04/28/18 1202 98 F (36.7 C)     Temp src --      SpO2 04/28/18 1202 99 %     Weight 04/28/18 1159 95 lb (43.1 kg)     Height 04/28/18 1159 _0  (1.6 m)     Head Circumference --      Peak Flow --      Pain Score 04/28/18 1158  3     Pain Loc --      Pain Edu? --      Excl. in [place]? --     Physical Exam  Constitutional: She is oriented to person, place, and time. She appears well-developed and well-nourished. No distress.    HENT:  Head: Normocephalic and atraumatic.  Eyes: Pupils are equal, round, and reactive to light. Conjunctivae and EOM are normal.  Neck: Normal range of motion. Neck supple.  Cardiovascular: Normal rate, regular rhythm, normal heart sounds and intact distal pulses.  No murmur heard. Pulmonary/Chest: Effort normal and breath sounds normal. No respiratory distress.  Abdominal: Soft. Bowel sounds are normal. She exhibits no distension. There is no tenderness.  Musculoskeletal: Normal range of motion. She exhibits tenderness (TTP to right thumb with bruising and swelling and TTP to left side of ribs). She exhibits no edema or deformity.  Neurological: She is alert and oriented to person, place, and time. No cranial nerve deficit or sensory deficit. She exhibits normal muscle tone. Coordination normal.  5+/5 strength, normal sensation, no drift  Skin: Skin is warm and dry.  Psychiatric: She has a normal mood and affect.  Nursing note and vitals reviewed.    ED Treatments / Results  Labs (all labs ordered are listed, but only abnormal results are displayed) Labs Reviewed - No data to display  EKG None  Radiology Dg Ribs Unilateral W/chest Left  Result Date: 04/28/2018 CLINICAL DATA:  Patient slipped on a grape causing her to fall and landing on left side. Left axillary and rib pain. EXAM: LEFT RIBS AND CHEST - 3+ VIEW COMPARISON:  None. FINDINGS: Top-normal heart size with aortic atherosclerosis. No pulmonary consolidation, effusion or pneumothorax. No acute displaced rib fracture. Osteoarthritis of the [facility] and glenohumeral joints bilaterally. Median sternotomy sutures are in place. S-shaped scoliotic curvature of the thoracolumbar spine is noted. IMPRESSION: 1. No acute displaced rib fracture. 2. No active pulmonary disease. 3. Thoracolumbar scoliosis. 4. Osteoarthritis of the acromioclavicular and glenohumeral joints. Electronically Signed   By: Ashley Royalty M.D.   On: 04/28/2018 14:01    Ct Head Wo Contrast  Result Date: 04/28/2018 CLINICAL DATA:  Head trauma, anticoagulated EXAM: CT HEAD WITHOUT CONTRAST CT CERVICAL SPINE WITHOUT CONTRAST TECHNIQUE: Multidetector CT imaging of the head and cervical spine was performed following the standard protocol without intravenous contrast. Multiplanar CT image reconstructions of the cervical spine were also generated. COMPARISON:  None. FINDINGS: CT HEAD FINDINGS Brain: No evidence of acute infarction, hemorrhage, extra-axial collection, ventriculomegaly, or mass effect. Old right frontal lobe infarct with encephalomalacia. Generalized cerebral atrophy. Periventricular white matter low attenuation likely secondary to microangiopathy. Vascular: Cerebrovascular atherosclerotic calcifications are noted. Skull: Negative for fracture or focal lesion. Sinuses/Orbits: Visualized portions of the orbits are unremarkable. Visualized portions of the paranasal sinuses and mastoid air cells are unremarkable. Other: None. CT CERVICAL SPINE FINDINGS Alignment: Normal. Skull base and vertebrae: No acute fracture. No primary bone lesion or focal pathologic process. Soft tissues and spinal canal: No prevertebral fluid or swelling. No visible canal hematoma. Disc levels: Degenerative disc disease with disc height loss at C4-5 and C5-6. Bilateral uncovertebral degenerative changes at C4-5 and C5-6. Upper chest: Right apical scarring. Other: No fluid collection or hematoma. Bilateral carotid artery atherosclerosis. IMPRESSION: 1. No acute intracranial pathology. 2. No acute osseous injury of the cervical spine. 3. Electronically Signed   By: Kathreen Devoid   On: 04/28/2018 13:33   Ct Cervical Spine Wo Contrast  Result Date:  04/28/2018 CLINICAL DATA:  Head trauma, anticoagulated EXAM: CT HEAD WITHOUT CONTRAST CT CERVICAL SPINE WITHOUT CONTRAST TECHNIQUE: Multidetector CT imaging of the head and cervical spine was performed following the standard protocol without  intravenous contrast. Multiplanar CT image reconstructions of the cervical spine were also generated. COMPARISON:  None. FINDINGS: CT HEAD FINDINGS Brain: No evidence of acute infarction, hemorrhage, extra-axial collection, ventriculomegaly, or mass effect. Old right frontal lobe infarct with encephalomalacia. Generalized cerebral atrophy. Periventricular white matter low attenuation likely secondary to microangiopathy. Vascular: Cerebrovascular atherosclerotic calcifications are noted. Skull: Negative for fracture or focal lesion. Sinuses/Orbits: Visualized portions of the orbits are unremarkable. Visualized portions of the paranasal sinuses and mastoid air cells are unremarkable. Other: None. CT CERVICAL SPINE FINDINGS Alignment: Normal. Skull base and vertebrae: No acute fracture. No primary bone lesion or focal pathologic process. Soft tissues and spinal canal: No prevertebral fluid or swelling. No visible canal hematoma. Disc levels: Degenerative disc disease with disc height loss at C4-5 and C5-6. Bilateral uncovertebral degenerative changes at C4-5 and C5-6. Upper chest: Right apical scarring. Other: No fluid collection or hematoma. Bilateral carotid artery atherosclerosis. IMPRESSION: 1. No acute intracranial pathology. 2. No acute osseous injury of the cervical spine. 3. Electronically Signed   By: Kathreen Devoid   On: 04/28/2018 13:33   Dg Hand Complete Left  Result Date: 04/28/2018 CLINICAL DATA:  Slipped and fell hitting the head and left side with pain EXAM: LEFT HAND - COMPLETE 3+ VIEW COMPARISON:  None. FINDINGS: The left radiocarpal joint space appears normal and the ulnar styloid is intact. There is chondrocalcinosis of the triangular fibrocartilage which may indicate CPPD arthropathy. There is significant degenerative change involving the left first [facility] joint with loss of joint space and sclerosis with spurring. There is some degenerative change of the left first MCP joint and the DIP joints but  no erosion is noted. IMPRESSION: Degenerative change primarily involving the wrist at the left first [facility] joint and to a lesser degree the left first MCP and DIP joints. Chondrocalcinosis may indicate CPPD arthropathy. Electronically Signed   By: Ivar Drape M.D.   On: 04/28/2018 14:01   Dg Hand Complete Right  Result Date: 04/28/2018 CLINICAL DATA:  Right hand pain after slip and fall on a grape. EXAM: RIGHT HAND - COMPLETE 3+ VIEW COMPARISON:  None. FINDINGS: Periarticular osteopenia without significant erosive change or periostitis. Minimal osteoarthritic joint space narrowing of the interphalangeal joint of the thumb and third DIP. Osteoarthritis at the base of the thumb metacarpal with proximal migration of the base of the thumb. Carpal rows are intact. The distal radius and ulna are unremarkable. There is chondrocalcinosis of the triangular fibrocartilage complex. More uniform joint space narrowing of the metacarpophalangeal joints. Mild soft tissue swelling over the ulnar aspect of the wrist. IMPRESSION: No acute osseous abnormality of the right hand and wrist. Mild soft tissue swelling along the ulnar aspect of the wrist. Osteoarthritis of the hand and wrist as described. Electronically Signed   By: Ashley Royalty M.D.   On: 04/28/2018 14:03   Dg Hip Unilat W Or Wo Pelvis 2-3 Views Left  Result Date: 04/28/2018 CLINICAL DATA:  Fall with left hip pain.  Initial encounter. EXAM: DG HIP (WITH OR WITHOUT PELVIS) 2-3V LEFT COMPARISON:  None. FINDINGS: No displaced or convincing hip fracture. No dislocation. The pelvic ring is intact. Osteopenia. No significant degenerative joint changes. IMPRESSION: No acute finding. Electronically Signed   By: Monte Fantasia M.D.   On: 04/28/2018  14:02    Procedures Procedures (including critical care time)  Medications Ordered in ED Medications - No data to display   Initial Impression / Assessment and Plan / ED Course  I have reviewed the triage vital signs  and the nursing notes.  Pertinent labs & imaging results that were available during my care of the patient were reviewed by me and considered in my medical decision making (see chart for details).     DAVITA SUBLETT is an 84 year old female w/ history of CAD who presents to the ED with mechanical fall.  Patient with left-sided flank pain, right thumb pain.  Patient did hit her head but did not lose consciousness.  Patient is on Plavix.  Patient is overall well-appearing.  No midline spinal pain.  Has tenderness mostly in the right thumb, left flank.  There is significant bruising over the right thumb pain.  There is pain in the scaphoid area.  CT scan of the head and neck are unremarkable.  X-rays of the ribs were unremarkable.  No fractures seen on hand x-rays.  Pelvic x-rays without any injuries.  Patient has been ambulatory without any issues since the fall.  Patient was placed in a thumb spica splint on the right side due to concern for possible scaphoid injury.  Recommend close follow-up with primary care doctor for likely repeat x-ray, repeat examination.  Recommend ice, Tylenol, Motrin.  Discharged from ED in good condition.  This chart was dictated using voice recognition software.  Despite best efforts to proofread,  errors can occur which can change the documentation meaning.   Final Clinical Impressions(s) / ED Diagnoses   Final diagnoses:  Contusion of right thumb without damage to nail, initial encounter    ED Discharge Orders    None       Lennice Sites, DO 04/28/18 1425

## 2018-04-28 NOTE — ED Notes (Signed)
Patient transported to CT 

## 2018-04-28 NOTE — ED Triage Notes (Addendum)
Per [facility] pt coming from [facility] where she slipped on a grape and caused pt to fall. Patient reports hitting left side of head on a granite countertop and landing on her left side on the kitchen floor. Pt c/o left rib pain, left hip pain and right hand pain. Pt on Plavix. No LOC.

## 2018-04-28 NOTE — ED Notes (Signed)
Pt provided with crackers and water.

## 2018-04-28 NOTE — Progress Notes (Signed)
Orthopedic Tech Progress Note Patient Details:  [name] 10/10/1931 [id number]  Ortho Devices Type of Ortho Device: Thumb velcro splint Ortho Device/Splint Location: rue Ortho Device/Splint Interventions: Ordered, Application, Adjustment   Post Interventions Patient Tolerated: Well Instructions Provided: Care of device, Adjustment of device   Karolee Stamps 04/28/2018, 4:05 PM

## 2018-05-05 DIAGNOSIS — L918 Other hypertrophic disorders of the skin: Secondary | ICD-10-CM | POA: Diagnosis not present

## 2018-05-05 DIAGNOSIS — D220 Melanocytic nevi of lip: Secondary | ICD-10-CM | POA: Diagnosis not present

## 2018-05-05 DIAGNOSIS — L821 Other seborrheic keratosis: Secondary | ICD-10-CM | POA: Diagnosis not present

## 2018-05-05 DIAGNOSIS — D1801 Hemangioma of skin and subcutaneous tissue: Secondary | ICD-10-CM | POA: Diagnosis not present

## 2018-05-05 DIAGNOSIS — L720 Epidermal cyst: Secondary | ICD-10-CM | POA: Diagnosis not present

## 2018-05-05 DIAGNOSIS — D692 Other nonthrombocytopenic purpura: Secondary | ICD-10-CM | POA: Diagnosis not present

## 2018-05-07 DIAGNOSIS — M79641 Pain in right hand: Secondary | ICD-10-CM | POA: Diagnosis not present

## 2018-05-11 ENCOUNTER — Ambulatory Visit: Payer: PPO | Admitting: Sports Medicine

## 2018-05-17 ENCOUNTER — Telehealth: Payer: Self-pay | Admitting: Cardiology

## 2018-05-17 NOTE — Telephone Encounter (Signed)
Unable to reach pt or leave a message  

## 2018-05-17 NOTE — Telephone Encounter (Signed)
  Patient has concerns regarding her hydrALAZINE (APRESOLINE) 25 MG tablet, Take 1 tablet (25 mg total) by mouth 3 (three) times daily because her blood pressure is running low and she is feeling drained with no energy and having some dizzy spells. Her blood pressure today is 106/46.  She was previously taking this medication at 10 mg, 3 x daily and it was changed at last visit on 04/26/18. She is wondering if her medication needs to be adjusted.

## 2018-05-20 NOTE — Telephone Encounter (Signed)
Spoke with [name], this morning her bp 172/80 prior to taking her medications. Then she reports by lunch it will be down to 120 and she will have occ lightheadedness and dizziness. At night if her bp is lower than 120, she will not take the hydralazine at all. Her urologist will only let her drink 40 oz daily and so she is not able to drink a lot of fluid. She wonders if she is on the right medication. She has a follow up appointment with hao meng pa next Wednesday and will bring all her bp readings to the appointment. She would like to make dr hochrein aware of what is going on.

## 2018-05-20 NOTE — Telephone Encounter (Signed)
I am aware  

## 2018-05-20 NOTE — Telephone Encounter (Signed)
Left message for patient, no change until follow up appointment next week.

## 2018-05-26 ENCOUNTER — Ambulatory Visit: Payer: PPO | Admitting: Physician Assistant

## 2018-05-26 ENCOUNTER — Encounter: Payer: Self-pay | Admitting: Physician Assistant

## 2018-05-26 VITALS — BP 116/60 | HR 67 | Ht 63.0 in | Wt 96.0 lb

## 2018-05-26 DIAGNOSIS — C9000 Multiple myeloma not having achieved remission: Secondary | ICD-10-CM

## 2018-05-26 DIAGNOSIS — I10 Essential (primary) hypertension: Secondary | ICD-10-CM | POA: Diagnosis not present

## 2018-05-26 DIAGNOSIS — E785 Hyperlipidemia, unspecified: Secondary | ICD-10-CM | POA: Diagnosis not present

## 2018-05-26 DIAGNOSIS — I25810 Atherosclerosis of coronary artery bypass graft(s) without angina pectoris: Secondary | ICD-10-CM | POA: Diagnosis not present

## 2018-05-26 MED ORDER — HYDRALAZINE HCL 25 MG PO TABS: 25.0000 mg | ORAL_TABLET | Freq: Two times a day (BID) | ORAL | 3 refills | Status: DC

## 2018-05-26 MED ORDER — HYDRALAZINE HCL 10 MG PO TABS: 10.0000 mg | ORAL_TABLET | Freq: Every day | ORAL | 3 refills | Status: AC

## 2018-05-26 NOTE — Patient Instructions (Signed)
Medication Instructions:  -Take: Hydralazine 25 mg twice a day at 8 am and 10 pm           Hydralazine 10 mg daily at 3 pm  -May take additional dose of Hydralazine 10 mg as needed for systolic (top number) greater than 160  -Check Blood Pressure twice a day, two hours after taking medication, If you need a refill on your cardiac medications before your next appointment, please call your pharmacy.   Lab work: None   Testing/Procedures: None  Follow-Up: At [facility], you and your health needs are our priority.  As part of our continuing mission to provide you with exceptional heart care, we have created designated Provider Care Teams.  These Care Teams include your primary Cardiologist (physician) and Advanced Practice Providers (APPs -  Physician Assistants and Nurse Practitioners) who all work together to provide you with the care you need, when you need it. You will need a follow up appointment in 3 months.  Please call our office 2 months in advance to schedule this appointment.  You may see Dr. Percival Spanish or one of the following Advanced Practice Providers on your designated Care Team:   Rosaria Ferries, PA-C . Jory Sims, DNP, ANP

## 2018-05-26 NOTE — Progress Notes (Signed)
Cardiology Office Note    Date:  05/28/2018   ID:  Tammy, [name] March 29, 1932, MRN [id number]  PCP:  [facility]  Cardiologist:  Dr. Percival Spanish  Chief Complaint  Patient presents with  . Follow-up    seen for Dr. Percival Spanish    History of Present Illness:  [name] is a 84 y.o. female with PMH of CAD s/p CABG 2007, HTN, HLD, history of jaundice, multiple myeloma, panic disorder, Raynaud's syndrome, and RLS.  She had a stress test in [facility] 3 years ago that had no ischemia, EF 75%.  In 2018, she had a NSTEMI requiring PCI of 90% left circumflex stenosis.  She has known atretic/occluded LIMA to LAD, widely patent left main, chronically occluded mid RCA due to in-stent restenosis, 75% ostial diagonal jailed by a stent, patent SVG to distal RCA.  She had angiogram by vascular surgery for evaluation of abdominal pain which did not find any superior mesenteric or celiac artery stenosis.  She has also seen by neurology for increased memory problem.  Last echocardiogram obtained on 08/30/2017 showed EF 55%, no regional wall motion abnormality, mild aortic regurgitation, trivial mitral regurgitation, PA peak pressure 21 mmHg.  I last saw the patient on 04/26/2018 for uncontrolled high blood pressure.  However her blood pressure was actually quite well controlled in the office that day.  According to the patient, she has taken additional 10 mg hydralazine that morning.  I increased her hydralazine to 25 mg 3 times a day.  She was recently seen in the ED on 04/28/2018 with a mechanical fall.  She did hit her head but did not lose consciousness.  CT of the head and neck was normal.  She was placed in a thumb spica splint on the right side due to concern for possible scaphoid injury.  Patient presents today for cardiology office visit along with her son.  Her blood pressure is again very well controlled here however at the facility, her blood pressure continue to be labile.   She has been having some dizziness especially in the afternoon with borderline low blood pressure.  For some of her age, I am more concerned of low blood pressure that high blood pressure.  I recommended to change her dry hydralazine to 25-10-25 mg.  Otherwise she does not have any significant lower extremity edema, orthopnea or PND.  Past Medical History:  Diagnosis Date  . Anxiety   . Arthritis   . CAD (coronary artery disease)   . Chondrocostal junction syndrome   . Chronic fatigue   . Dementia ([place])    without Behavioral Disturbance  . GERD (gastroesophageal reflux disease)   . Hx of CABG   . Hyperlipidemia    GOAL LDL <70  . Hypertension   . IBS (irritable bowel syndrome)   . Incontinent of urine   . Jaundice    yellow jaundice 3rd grade  . Major depressive disorder   . Mild chronic anemia   . Mixed incontinence   . Multiple myeloma (HCC)    not having achieved remission  . NSTEMI (non-ST elevated myocardial infarction) ([place]) 01/31/2017  . Osteopenia   . Ovarian cyst, bilateral    Rt complex  . Overactive bladder   . Panic disorder   . Pulmonary nodules   . Radiculopathy   . Raynaud's syndrome   . Restless legs syndrome   . Spinal stenosis of lumbar region   . Thyroid nodule   .  Unsteadiness on feet     Past Surgical History:  Procedure Laterality Date  . BUNIONECTOMY WITH HAMMERTOE RECONSTRUCTION Right 08/04/2012   Procedure:  HAMMERTOE RECONSTRUCTION;  Surgeon: Colin Rhein, MD;  Location: [facility];  Service: Orthopedics;  Laterality: Right;  HAMMER TOE RECONSTRUCTION PROBABLE FLEXOR DIGITORUM LONGUS TO PROXIMAL PHALANX TRANSFER LATERALIZED   . CAPSULOTOMY Right 08/04/2012   Procedure: CAPSULOTOMY;  Surgeon: Colin Rhein, MD;  Location: [place];  Service: Orthopedics;  Laterality: Right;  RIGHT 2ND TOE METATARSOPHALANGEAL JOINT DORSAL CAPSULOTOMY   . CATARACT EXTRACTION, BILATERAL     both cataracts  . COLONOSCOPY    .  CORONARY ANGIOPLASTY WITH STENT PLACEMENT  2014   2 stents  . CORONARY ARTERY BYPASS GRAFT  2007   LIMA to LAD, SVG to RCA  non-ST segment MI 08/2009 (Dr. Tamala Julian)  . CORONARY STENT INTERVENTION N/A 02/02/2017   Procedure: CORONARY STENT INTERVENTION;  Surgeon: Belva Crome, MD;  Location: [facility];  Service: Cardiovascular;  Laterality: N/A;  Prox CFX  . DILATION AND CURETTAGE OF UTERUS    . LEFT HEART CATH AND CORS/GRAFTS ANGIOGRAPHY N/A 02/02/2017   Procedure: LEFT HEART CATH AND CORS/GRAFTS ANGIOGRAPHY;  Surgeon: Belva Crome, MD;  Location: [facility];  Service: Cardiovascular;  Laterality: N/A;  . LEFT HEART CATHETERIZATION WITH CORONARY/GRAFT ANGIOGRAM N/A 07/09/2012   Procedure: LEFT HEART CATHETERIZATION WITH Beatrix Fetters;  Surgeon: Sinclair Grooms, MD;  Location[facility] C[facility];  Service: Cardiovascular;  Laterality: N/A;  . TONSILLECTOMY AND ADENOIDECTOMY    . UPPER GASTROINTESTINAL ENDOSCOPY    . VISCERAL ANGIOGRAPHY N/A 04/15/2017   Procedure: VISCERAL ANGIOGRAPHY;  Surgeon: Waynetta Sandy, MD;  Location: [place] [facility];  Service: Cardiovascular;  Laterality: N/A;    Current Medications: Outpatient Medications Prior to Visit  Medication Sig Dispense Refill  . acetaminophen (TYLENOL) 650 MG CR tablet Take 1,300 mg by mouth 2 (two) times daily.    . Alum Hydroxide-Mag Carbonate (GAVISCON EXTRA STRENGTH) [phone] MG/10ML SUSP Take by mouth. Takes 4 times daily as needed    . aspirin EC 81 MG tablet Take 1 tablet (81 mg total) by mouth daily. 90 tablet 3  . Biotin 5000 MCG TABS Take 5,000 mcg by mouth daily.    . Calcium Carb-Cholecalciferol (CALCIUM 500 + D3) 500-600 MG-UNIT TABS Take 1 tablet by mouth daily.    . carvedilol (COREG) 3.125 MG tablet Take 1 tablet (3.125 mg total) by mouth 2 (two) times daily with a meal. 60 tablet 3  . clopidogrel (PLAVIX) 75 MG tablet Take last dose of Brilinta 04/01/17 in evening. Take 4 tablets of  Clopidogrel on 04/02/17 then take 1 tablet by mouth daily thereafter. (Patient taking differently: Take 75 mg by mouth daily. ) 94 tablet 1  . cycloSPORINE (RESTASIS) 0.05 % ophthalmic emulsion 1 drop 2 (two) times daily.    Marland Kitchen docusate sodium (COLACE) 100 MG capsule Take 100 mg by mouth daily.    . hydrALAZINE (APRESOLINE) 10 MG tablet Take 10 mg by mouth 3 (three) times daily as needed.    Marland Kitchen Hyoscyamine Sulfate SL (LEVSIN/SL) 0.125 MG SUBL Take 1 tablet, dissolve under the tongue every 6 hours as needed for abdominal pain. 20 each 1  . iron polysaccharides (NU-IRON) 150 MG capsule Take 150 mg by mouth 2 (two) times daily.     . isosorbide mononitrate (IMDUR) 60 MG 24 hr tablet Take 60 mg by mouth  daily.    . levocetirizine (XYZAL) 5 MG tablet Take 1 tablet (5 mg total) by mouth every evening. (Patient taking differently: Take 5 mg by mouth at bedtime. ) 30 tablet 5  . LORazepam (ATIVAN) 0.5 MG tablet Take 0.25-0.5 mg by mouth See admin instructions. Take 0.25 mg in the morning and 0.77m at bedtime.  May take an additional 0.5 mg twice daily as needed for anxiety    . Melatonin 5 MG TABS Take 5 mg by mouth at bedtime.    . memantine (NAMENDA) 5 MG tablet Take 5 mg by mouth daily.    . mirabegron ER (MYRBETRIQ) 50 MG TB24 tablet Take 50 mg by mouth daily.    . montelukast (SINGULAIR) 10 MG tablet Take 10 mg by mouth at bedtime.     . Multiple Vitamins-Minerals (CENTRUM SILVER 50+WOMEN) TABS Take 1 tablet by mouth daily.    .Marland Kitchenneomycin-polymyxin-hydrocortisone (CORTISPORIN) OTIC solution Apply 1-2 drops to both toes once daily until all used (Patient not taking: Reported on 04/26/2018) 10 mL 0  . nitroGLYCERIN (NITROSTAT) 0.4 MG SL tablet Place 1 tablet (0.4 mg total) under the tongue every 5 (five) minutes as needed for chest pain. Reported on 11/12/2015 25 tablet 2  . Peppermint Oil (IBGARD) 90 MG CPCR Take 2 capsules by mouth as directed. Per box instructions 1 capsule 0  . polyethylene glycol  (MIRALAX / GLYCOLAX) packet Take 17 g by mouth 2 (two) times daily. 1 each 0  . PREMARIN vaginal cream Place 1 Applicatorful vaginally once a week.     . Probiotic Product (VSL#3 PO) Take 1 packet by mouth every morning.    . psyllium (HYDROCIL/METAMUCIL) 95 % PACK Take 1 packet by mouth daily.    . sertraline (ZOLOFT) 25 MG tablet Take 25-50 mg by mouth See admin instructions. Takes 248mpo AM and 5067mo PM.     . hydrALAZINE (APRESOLINE) 25 MG tablet Take 1 tablet (25 mg total) by mouth 3 (three) times daily. 90 tablet 5   No facility-administered medications prior to visit.      Allergies:   Butazolidin [phenylbutazone]; Cephalosporins; Codeine; Levaquin [levofloxacin]; Macrodantin; Morphine and related; Trimethoprim; Buprenorphine; Cilostazol; Dexilant [dexlansoprazole]; Diclofenac; Erythromycin; Lactose intolerance (gi); Morphine; Nitrofurantoin; Simvastatin; Sulfa antibiotics; Vesicare [solifenacin]; Clindamycin/lincomycin; Doxycycline; Food; Ibuprofen; Lincomycin hcl; Penicillins; Toviaz [fesoterodine fumarate er]; and Voltaren [diclofenac sodium]   Social History   Socioeconomic History  . Marital status: Married    Spouse name: Not on file  . Number of children: 4  . Years of education: Not on file  . Highest education level: Not on file  Occupational History  . Occupation: homemaker   . Occupation: dirMaterials engineerucation  Social Needs  . Financial resource strain: Not on file  . Food insecurity:    Worry: Not on file    Inability: Not on file  . Transportation needs:    Medical: Not on file    Non-medical: Not on file  Tobacco Use  . Smoking status: Never Smoker  . Smokeless tobacco: Never Used  Substance and Sexual Activity  . Alcohol use: Never    Frequency: Never  . Drug use: No  . Sexual activity: Never  Lifestyle  . Physical activity:    Days per week: Not on file    Minutes per session: Not on file  . Stress: Not on file  Relationships  . Social  connections:    Talks on phone: Not on file    Gets together:  Not on file    Attends religious service: Not on file    Active member of club or organization: Not on file    Attends meetings of clubs or organizations: Not on file    Relationship status: Not on file  Other Topics Concern  . Not on file  Social History Narrative  . Not on file     Family History:  The patient's family history includes Breast cancer in her paternal aunt; CAD in her mother; Heart attack in her mother; Obesity in her sister; Ulcers in her father.   ROS:   Please see the history of present illness.    ROS All other systems reviewed and are negative.   PHYSICAL EXAM:   VS:  BP 116/60   Pulse 67   Ht '5\' 3"'  (1.6 m)   Wt 96 lb (43.5 kg)   SpO2 98%   BMI 17.01 kg/m    GEN: Well nourished, well developed, in no acute distress  HEENT: normal  Neck: no JVD, carotid bruits, or masses Cardiac: RRR; no murmurs, rubs, or gallops,no edema  Respiratory:  clear to auscultation bilaterally, normal work of breathing GI: soft, nontender, nondistended, + BS MS: no deformity or atrophy  Skin: warm and dry, no rash Neuro:  Alert and Oriented x 3, Strength and sensation are intact Psych: euthymic mood, full affect  Wt Readings from Last 3 Encounters:  05/26/18 96 lb (43.5 kg)  04/28/18 95 lb (43.1 kg)  04/26/18 95 lb (43.1 kg)      Studies/Labs Reviewed:   EKG:  EKG is not ordered today.    Recent Labs: 08/29/2017: TSH 3.070 08/30/2017: Magnesium 2.2 01/18/2018: ALT 13; BUN 19; Creatinine 0.80; Hemoglobin 9.4; Platelet Count 283; Potassium 4.0; Sodium 133   Lipid Panel    Component Value Date/Time   CHOL 152 02/01/2017 0919   TRIG 41 02/01/2017 0919   HDL 75 02/01/2017 0919   CHOLHDL 2.0 02/01/2017 0919   VLDL 8 02/01/2017 0919   LDLCALC 69 02/01/2017 0919    Additional studies/ records that were reviewed today include:   Cath 02/02/2017  Presentation with acute coronary syndrome, with symptoms  that are atypical.  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  Widely patent left main  De novo 90% stenosis in the proximal to mid circumflex since the last catheterization in 2014.Marland Kitchen  Chronic occlusion of the mid right coronary due to in-stent restenosis  Patent LAD with 30-40% diffuse ISR in a previously placed stent. 75% ostial stenosis of the diagonal jailed by the stent.  Patent saphenous vein graft to the distal right coronary. The proximal third of the vein graft contains up to 50% diffuse obstructive disease. Unchanged from 2014 angiogram.  Successful drug-eluting stent procedure on the 90% circumflex stenosis which was reduced to 0% with TIMI grade 3 flow using a 2.25 x 12 Xience Sierra DES postdilated to 2.5 mm in diameter.  RECOMMENDATIONS:   Dual antiplatelet therapy for 12 months  Risk factor modification including LDL less than 70.    Echo 08/30/2017 LV EF: 55% Study Conclusions  - Left ventricle: The cavity size was normal. Wall thickness was   normal. The estimated ejection fraction was 55%. Wall motion was   normal; there were no regional wall motion abnormalities. The   study is not technically sufficient to allow evaluation of LV   diastolic function. - Aortic valve: Mildly calcified annulus. There was mild   regurgitation. - Mitral valve: Mildly calcified annulus. There was  trivial   regurgitation. - Right atrium: Central venous pressure (est): 3 mm Hg. - Atrial septum: No defect or patent foramen ovale was identified. - Tricuspid valve: There was mild regurgitation. - Pulmonary arteries: PA peak pressure: 21 mm Hg (S). - Pericardium, extracardiac: There was no pericardial effusion.  ASSESSMENT:    1. Coronary artery disease involving coronary bypass graft of native heart without angina pectoris   2. Essential hypertension   3. Hyperlipidemia LDL goal <70   4. Multiple myeloma, remission status unspecified (HCC)      PLAN:  In order of problems  listed above:  1. CAD: Underwent DES to left circumflex artery in 2018.  Continue aspirin and Plavix  2. Hypertension: Blood pressure continue to be labile, I increased her hydralazine to 25 mg 3 times daily last time, however she has been noticing some dizziness and the borderline low blood pressure in the early afternoon.  I will change her hydralazine to 25-10-25 mg during the day.  She may take extra 10 mg hydralazine as needed if her systolic blood pressures greater than 160 mmHg  3. Hyperlipidemia: She is not on a statin however her cholesterol is fairly controlled based on the previous lab work in 2018.  She is due for repeat fasting lipid panel  4. Multiple myeloma: Followed by oncology    Medication Adjustments/Labs and Tests Ordered: Current medicines are reviewed at length with the patient today.  Concerns regarding medicines are outlined above.  Medication changes, Labs and Tests ordered today are listed in the Patient Instructions below. Patient Instructions  Medication Instructions:  -Take: Hydralazine 25 mg twice a day at 8 am and 10 pm           Hydralazine 10 mg daily at 3 pm  -May take additional dose of Hydralazine 10 mg as needed for systolic (top number) greater than 160  -Check Blood Pressure twice a day, two hours after taking medication, If you need a refill on your cardiac medications before your next appointment, please call your pharmacy.   Lab work: None   Testing/Procedures: None  Follow-Up: At [facility], you and your health needs are our priority.  As part of our continuing mission to provide you with exceptional heart care, we have created designated Provider Care Teams.  These Care Teams include your primary Cardiologist (physician) and Advanced Practice Providers (APPs -  Physician Assistants and Nurse Practitioners) who all work together to provide you with the care you need, when you need it. You will need a follow up appointment in 3 months.   Please call our office 2 months in advance to schedule this appointment.  You may see Dr. Percival Spanish or one of the following Advanced Practice Providers on your designated Care Team:   Rosaria Ferries, PA-C . Jory Sims, DNP, ANP        Signed, Almyra Deforest, [place]  05/28/2018 11:50 PM    [facility] [address], [place], [place]  [address] Phone: [phone]; Fax: [phone]

## 2018-05-28 ENCOUNTER — Encounter: Payer: Self-pay | Admitting: Physician Assistant

## 2018-06-03 DIAGNOSIS — M25552 Pain in left hip: Secondary | ICD-10-CM | POA: Diagnosis not present

## 2018-06-15 DIAGNOSIS — M6281 Muscle weakness (generalized): Secondary | ICD-10-CM | POA: Diagnosis not present

## 2018-06-15 DIAGNOSIS — R262 Difficulty in walking, not elsewhere classified: Secondary | ICD-10-CM | POA: Diagnosis not present

## 2018-06-15 DIAGNOSIS — M25552 Pain in left hip: Secondary | ICD-10-CM | POA: Diagnosis not present

## 2018-06-16 DIAGNOSIS — R300 Dysuria: Secondary | ICD-10-CM | POA: Diagnosis not present

## 2018-06-22 ENCOUNTER — Encounter: Payer: Self-pay | Admitting: Sports Medicine

## 2018-06-22 ENCOUNTER — Ambulatory Visit (INDEPENDENT_AMBULATORY_CARE_PROVIDER_SITE_OTHER): Payer: PPO | Admitting: Sports Medicine

## 2018-06-22 DIAGNOSIS — M79674 Pain in right toe(s): Secondary | ICD-10-CM

## 2018-06-22 DIAGNOSIS — M79675 Pain in left toe(s): Secondary | ICD-10-CM | POA: Diagnosis not present

## 2018-06-22 DIAGNOSIS — L600 Ingrowing nail: Secondary | ICD-10-CM | POA: Diagnosis not present

## 2018-06-22 DIAGNOSIS — L603 Nail dystrophy: Secondary | ICD-10-CM | POA: Diagnosis not present

## 2018-06-22 DIAGNOSIS — M25552 Pain in left hip: Secondary | ICD-10-CM | POA: Diagnosis not present

## 2018-06-22 NOTE — Progress Notes (Signed)
Subjective: [name] is a 85 y.o. female patient seen today in office with complaint of mildly painful thickened and elongated toenails especially at left 1st toe medial margin most painful than the right great toe medial margin; unable to trim. Patient states that her ingrown nails are doing good with no pain; reports that she has been soaking and using corticosporin without any recurrence of symptoms.  No other issues at this time.  Patient Active Problem List   Diagnosis Date Noted  . Left thyroid nodule 08/29/2017  . Chronic idiopathic constipation 08/18/2017  . Abdominal pain 07/31/2017  . Mesenteric ischemia ([place]) 05/04/2017  . Unsteadiness on feet   . Unstable angina pectoris ([place])   . Tinea pedis   . Spinal stenosis of lumbar region   . Somnolence   . Seasonal allergic rhinitis   . Restless legs syndrome   . Raynaud's syndrome   . Raynauds syndrome   . Radiculopathy   . Panic disorder   . Ovarian cyst, bilateral   . Osteopenia   . Myocardial infarction ([place])   . Muscle weakness (generalized)   . Mixed incontinence   . Mild chronic anemia   . Major depressive disorder   . Long term (current) use of aspirin   . Intermediate coronary syndrome ([place])   . Incontinent of urine   . IBS (irritable bowel syndrome)   . Hypercholesteremia   . Hx of CABG   . GERD (gastroesophageal reflux disease)   . Dementia ([place])   . Chest pain   . Atherosclerotic heart disease of native coronary artery without angina pectoris   . Arthritis   . Anxiety   . Allergic rhinitis   . Abnormal weight loss   . History of adenomatous polyp of colon 03/02/2017  . NSTEMI (non-ST elevated myocardial infarction) ([place]) 02/02/2017  . SOB (shortness of breath) 09/02/2016  . Dizziness 09/02/2016  . Non-ST elevation (NSTEMI) myocardial infarction ([place]) 11/26/2015  . Polymyalgia rheumatica ([place]) 11/26/2015  . Hypokalemia 11/26/2015  . Mild persistent asthma 11/12/2015  . Coronary artery disease  involving autologous artery coronary bypass graft with angina pectoris with documented spasm ([place]) 11/12/2015  . Multiple myeloma in remission ([place]) 11/12/2015  . Current use of beta blocker 11/12/2015  . Gastroesophageal reflux disease without esophagitis 11/12/2015  . Other allergic rhinitis 11/12/2015  . Weight loss, non-intentional 10/03/2015  . Asthma 09/24/2015  . Asthma in adult without complication 16/03/9603  . Seasonal allergic rhinitis due to pollen 09/24/2015  . Cervical radiculopathy 07/25/2015  . Chronic coronary artery disease 07/25/2015  . Coronary artery disease 07/25/2015  . Disturbance in sleep behavior 07/25/2015  . Elevated CK 07/25/2015  . Elevated creatine kinase level 07/25/2015  . Generalized osteoarthritis of hand 07/25/2015  . Other long term (current) drug therapy 07/25/2015  . Overactive bladder 07/25/2015  . Restless leg 07/25/2015  . Restless leg syndrome 07/25/2015  . Sleep disturbances 07/25/2015  . Idiopathic peripheral neuropathy 07/04/2015  . Lumbar radiculopathy, chronic 07/04/2015  . Mixed dementia ([place]) 07/04/2015  . Risk for falls 07/04/2015  . Sleep disturbance 07/04/2015  . Chest pain with moderate risk for cardiac etiology 06/13/2015  . Right sciatic nerve pain 10/19/2014  . Abnormal thyroid blood test 08/25/2014  . Chronic fatigue 08/09/2014  . Multiple myeloma ([place]) 07/21/2013  . Depression 01/14/2013  . Generalized anxiety disorder 01/14/2013  . Gastroesophageal reflux disease 01/13/2013  . Urine test positive for microalbuminuria 12/08/2011  . Cyst of ovary 12/17/2010  . KNEE  PAIN, BILATERAL 08/15/2010  . Abnormal gait 08/15/2010  . Atherosclerosis of coronary artery bypass graft 10/11/2009  . CHEST PAIN, NON-CARDIAC 10/11/2009  . HIP PAIN, RIGHT 08/13/2009  . GREATER TROCHANTERIC BURSITIS 08/13/2009  . LUMBOSACRAL SPONDYLOSIS WITHOUT MYELOPATHY 11/10/2008  . Osteoarthritis of lumbosacral spine without myelopathy 11/10/2008  .  FOOT PAIN, BILATERAL 06/30/2008  . Hyperlipidemia 06/22/2008  . Hypertension 06/22/2008  . Raynaud's disease 06/22/2008  . BURSITIS, LEFT SHOULDER 06/22/2008    Current Outpatient Medications on File Prior to Visit  Medication Sig Dispense Refill  . acetaminophen (TYLENOL) 650 MG CR tablet Take 1,300 mg by mouth 2 (two) times daily.    . Alum Hydroxide-Mag Carbonate (GAVISCON EXTRA STRENGTH) 734 [id number] 2253 MG/10ML SUSP Take by mouth. Takes 4 times daily as needed    . aspirin EC 81 MG tablet Take 1 tablet (81 mg total) by mouth daily. 90 tablet 3  . Biotin 5000 MCG TABS Take 5,000 mcg by mouth daily.    . Calcium Carb-Cholecalciferol (CALCIUM 500 + D3) 500-600 MG-UNIT TABS Take 1 tablet by mouth daily.    . carvedilol (COREG) 3.125 MG tablet Take 1 tablet (3.125 mg total) by mouth 2 (two) times daily with a meal. 60 tablet 3  . clopidogrel (PLAVIX) 75 MG tablet Take last dose of Brilinta 04/01/17 in evening. Take 4 tablets of Clopidogrel on 04/02/17 then take 1 tablet by mouth daily thereafter. (Patient taking differently: Take 75 mg by mouth daily. ) 94 tablet 1  . cycloSPORINE (RESTASIS) 0.05 % ophthalmic emulsion 1 drop 2 (two) times daily.    Marland Kitchen docusate sodium (COLACE) 100 MG capsule Take 100 mg by mouth daily.    . hydrALAZINE (APRESOLINE) 10 MG tablet Take 10 mg by mouth 3 (three) times daily as needed.    . hydrALAZINE (APRESOLINE) 10 MG tablet Take 1 tablet (10 mg total) by mouth daily at 3 pm. 90 tablet 3  . hydrALAZINE (APRESOLINE) 25 MG tablet Take 1 tablet (25 mg total) by mouth 2 (two) times daily. 8 am and 10 pm 180 tablet 3  . Hyoscyamine Sulfate SL (LEVSIN/SL) 0.125 MG SUBL Take 1 tablet, dissolve under the tongue every 6 hours as needed for abdominal pain. 20 each 1  . iron polysaccharides (NU-IRON) 150 MG capsule Take 150 mg by mouth 2 (two) times daily.     . isosorbide mononitrate (IMDUR) 60 MG 24 hr tablet Take 60 mg by mouth daily.    Marland Kitchen levocetirizine (XYZAL) 5 MG tablet Take 1  tablet (5 mg total) by mouth every evening. (Patient taking differently: Take 5 mg by mouth at bedtime. ) 30 tablet 5  . LORazepam (ATIVAN) 0.5 MG tablet Take 0.25-0.5 mg by mouth See admin instructions. Take 0.25 mg in the morning and 0.82m at bedtime.  May take an additional 0.5 mg twice daily as needed for anxiety    . Melatonin 5 MG TABS Take 5 mg by mouth at bedtime.    . memantine (NAMENDA) 5 MG tablet Take 5 mg by mouth daily.    . mirabegron ER (MYRBETRIQ) 50 MG TB24 tablet Take 50 mg by mouth daily.    . montelukast (SINGULAIR) 10 MG tablet Take 10 mg by mouth at bedtime.     . Multiple Vitamins-Minerals (CENTRUM SILVER 50+WOMEN) TABS Take 1 tablet by mouth daily.    .Marland Kitchenneomycin-polymyxin-hydrocortisone (CORTISPORIN) OTIC solution Apply 1-2 drops to both toes once daily until all used 10 mL 0  . nitroGLYCERIN (NITROSTAT) 0.4 MG SL tablet  Place 1 tablet (0.4 mg total) under the tongue every 5 (five) minutes as needed for chest pain. Reported on 11/12/2015 25 tablet 2  . Peppermint Oil (IBGARD) 90 MG CPCR Take 2 capsules by mouth as directed. Per box instructions 1 capsule 0  . polyethylene glycol (MIRALAX / GLYCOLAX) packet Take 17 g by mouth 2 (two) times daily. 1 each 0  . PREMARIN vaginal cream Place 1 Applicatorful vaginally once a week.     . Probiotic Product (VSL#3 PO) Take 1 packet by mouth every morning.    . psyllium (HYDROCIL/METAMUCIL) 95 % PACK Take 1 packet by mouth daily.    . sertraline (ZOLOFT) 25 MG tablet Take 25-50 mg by mouth See admin instructions. Takes 40m po AM and 536mpo PM.      No current facility-administered medications on file prior to visit.     Allergies  Allergen Reactions  . Butazolidin [Phenylbutazone] Swelling and Rash  . Cephalosporins Other (See Comments)    unknown  . Codeine Nausea Only  . Levaquin [Levofloxacin] Shortness Of Breath  . Macrodantin Other (See Comments)    Double vision  . Morphine And Related Other (See Comments)     hallucinations  . Trimethoprim Nausea Only  . Buprenorphine     From MAR  . Cilostazol Other (See Comments)    Stomach upset  . Dexilant [Dexlansoprazole] Diarrhea  . Diclofenac     Documented on MAR  . Erythromycin Nausea And Vomiting    All "mycin" drugs  . Lactose Intolerance ([place])   . Morphine   . Nitrofurantoin     Double vision  . Simvastatin Other (See Comments)    Muscle aches   . Sulfa Antibiotics Other (See Comments)    unknown  . Vesicare [Solifenacin] Other (See Comments)    unknown  . Clindamycin/Lincomycin Other (See Comments)    Upset stomach  . Doxycycline Diarrhea  . Food Other (See Comments)    No spicy foods or black pepper, raw onions - causes gerd  . Ibuprofen Hives and Rash  . Lincomycin Hcl Other (See Comments)    Upset stomach  . Penicillins Rash    Has patient had a PCN reaction causing immediate rash, facial/tongue/throat swelling, SOB or lightheadedness with hypotension: Yes Has patient had a PCN reaction causing severe rash involving mucus membranes or skin necrosis: No Has patient had a PCN reaction that required hospitalization No Has patient had a PCN reaction occurring within the last 10 years: No If all of the above answers are "NO", then may proceed with Cephalosporin use.   . Lisbeth PlyFesoterodine Fumarate Er] Other (See Comments)    unknown  . Voltaren [Diclofenac Sodium] Rash    Objective: Physical Exam  General: Well developed, nourished, no acute distress, awake, alert and oriented x 2  Vascular: Dorsalis pedis artery 1/4 bilateral, Posterior tibial artery 0/4 bilateral, skin temperature warm to warm proximal to distal bilateral lower extremities, mild varicosities, pedal hair present bilateral.  Neurological: Gross sensation present via light touch bilateral.   Dermatological: Skin is warm, dry, and supple bilateral, Nails 1-10 are tender, long, thick, and discolored with mild subungal debris with mild ingrowing without  infection at Left>Right medial hallux, no webspace macerations present bilateral, no open lesions present bilateral, no callus/corns/hyperkeratotic tissue present bilateral. No signs of infection bilateral.  Musculoskeletal: Asymptomatic hallux malleous L>R boney deformities noted bilateral. Muscular strength 4/5 without painon range of motion.+ History of arthritis. No pain with calf compression bilateral.  Assessment and Plan:  Problem List Items Addressed This Visit    None    Visit Diagnoses    Ingrown nail    -  Primary   Toe pain, bilateral       Nail dystrophy          -Examined patient.  -Discussed treatment options for painful ingrowning mycotic nails as previous. -Mechanically debrided and reduced mycotic nails with sterile nail nipper and dremel nail file without incident and remove the offending nail borders to patient's tolerance. -Recommend again Epsom salt soaks and corticosporin solution as needed may call for a refill -Patient to return in 2.5 months for nail trim or sooner if symptoms worsen.  Landis Martins, DPM

## 2018-06-23 DIAGNOSIS — M549 Dorsalgia, unspecified: Secondary | ICD-10-CM | POA: Diagnosis not present

## 2018-06-23 DIAGNOSIS — M5136 Other intervertebral disc degeneration, lumbar region: Secondary | ICD-10-CM | POA: Diagnosis not present

## 2018-06-24 ENCOUNTER — Telehealth: Payer: Self-pay | Admitting: Cardiology

## 2018-06-24 NOTE — Telephone Encounter (Signed)
New Message:    Please call, question about how often and when she should take her blood pressure each day

## 2018-06-24 NOTE — Telephone Encounter (Signed)
Spoke with pt, per AVS she is to take her bp 2x daily 2 hours after taking her medication. Spoke with the nurse at [facility] and explained that the bp should be taken 2 hours after the medication at 10 am and 5 pm.

## 2018-06-30 ENCOUNTER — Emergency Department (HOSPITAL_COMMUNITY): Payer: PPO

## 2018-06-30 ENCOUNTER — Observation Stay (HOSPITAL_COMMUNITY)
Admission: EM | Admit: 2018-06-30 | Discharge: 2018-07-02 | Disposition: A | Discharge status: Skilled Nursing Facility | Payer: PPO | Attending: Internal Medicine | Admitting: Internal Medicine

## 2018-06-30 ENCOUNTER — Encounter (HOSPITAL_COMMUNITY): Payer: Self-pay

## 2018-06-30 ENCOUNTER — Other Ambulatory Visit: Payer: Self-pay

## 2018-06-30 DIAGNOSIS — M48061 Spinal stenosis, lumbar region without neurogenic claudication: Secondary | ICD-10-CM | POA: Diagnosis not present

## 2018-06-30 DIAGNOSIS — I10 Essential (primary) hypertension: Secondary | ICD-10-CM | POA: Diagnosis not present

## 2018-06-30 DIAGNOSIS — Z7902 Long term (current) use of antithrombotics/antiplatelets: Secondary | ICD-10-CM | POA: Insufficient documentation

## 2018-06-30 DIAGNOSIS — I7300 Raynaud's syndrome without gangrene: Secondary | ICD-10-CM | POA: Insufficient documentation

## 2018-06-30 DIAGNOSIS — I252 Old myocardial infarction: Secondary | ICD-10-CM | POA: Diagnosis not present

## 2018-06-30 DIAGNOSIS — F411 Generalized anxiety disorder: Secondary | ICD-10-CM | POA: Diagnosis not present

## 2018-06-30 DIAGNOSIS — E876 Hypokalemia: Secondary | ICD-10-CM | POA: Diagnosis not present

## 2018-06-30 DIAGNOSIS — Z955 Presence of coronary angioplasty implant and graft: Secondary | ICD-10-CM | POA: Diagnosis not present

## 2018-06-30 DIAGNOSIS — S73005A Unspecified dislocation of left hip, initial encounter: Secondary | ICD-10-CM

## 2018-06-30 DIAGNOSIS — F32A Depression, unspecified: Secondary | ICD-10-CM | POA: Diagnosis present

## 2018-06-30 DIAGNOSIS — E785 Hyperlipidemia, unspecified: Secondary | ICD-10-CM | POA: Insufficient documentation

## 2018-06-30 DIAGNOSIS — M8580 Other specified disorders of bone density and structure, unspecified site: Secondary | ICD-10-CM | POA: Insufficient documentation

## 2018-06-30 DIAGNOSIS — Z66 Do not resuscitate: Secondary | ICD-10-CM | POA: Diagnosis not present

## 2018-06-30 DIAGNOSIS — Z7982 Long term (current) use of aspirin: Secondary | ICD-10-CM | POA: Diagnosis not present

## 2018-06-30 DIAGNOSIS — I2510 Atherosclerotic heart disease of native coronary artery without angina pectoris: Secondary | ICD-10-CM | POA: Diagnosis present

## 2018-06-30 DIAGNOSIS — D649 Anemia, unspecified: Secondary | ICD-10-CM | POA: Insufficient documentation

## 2018-06-30 DIAGNOSIS — M25552 Pain in left hip: Principal | ICD-10-CM

## 2018-06-30 DIAGNOSIS — F329 Major depressive disorder, single episode, unspecified: Secondary | ICD-10-CM | POA: Diagnosis not present

## 2018-06-30 DIAGNOSIS — K581 Irritable bowel syndrome with constipation: Secondary | ICD-10-CM | POA: Insufficient documentation

## 2018-06-30 DIAGNOSIS — R52 Pain, unspecified: Secondary | ICD-10-CM | POA: Diagnosis not present

## 2018-06-30 DIAGNOSIS — Z79899 Other long term (current) drug therapy: Secondary | ICD-10-CM | POA: Diagnosis not present

## 2018-06-30 DIAGNOSIS — G2581 Restless legs syndrome: Secondary | ICD-10-CM | POA: Diagnosis not present

## 2018-06-30 DIAGNOSIS — M5136 Other intervertebral disc degeneration, lumbar region: Secondary | ICD-10-CM | POA: Insufficient documentation

## 2018-06-30 DIAGNOSIS — C9001 Multiple myeloma in remission: Secondary | ICD-10-CM | POA: Diagnosis not present

## 2018-06-30 DIAGNOSIS — F0390 Unspecified dementia without behavioral disturbance: Secondary | ICD-10-CM | POA: Insufficient documentation

## 2018-06-30 DIAGNOSIS — Z8249 Family history of ischemic heart disease and other diseases of the circulatory system: Secondary | ICD-10-CM | POA: Insufficient documentation

## 2018-06-30 DIAGNOSIS — M1990 Unspecified osteoarthritis, unspecified site: Secondary | ICD-10-CM | POA: Diagnosis not present

## 2018-06-30 DIAGNOSIS — Z951 Presence of aortocoronary bypass graft: Secondary | ICD-10-CM | POA: Diagnosis not present

## 2018-06-30 DIAGNOSIS — R609 Edema, unspecified: Secondary | ICD-10-CM | POA: Diagnosis not present

## 2018-06-30 DIAGNOSIS — K219 Gastro-esophageal reflux disease without esophagitis: Secondary | ICD-10-CM | POA: Insufficient documentation

## 2018-06-30 LAB — URINALYSIS, ROUTINE W REFLEX MICROSCOPIC
Bilirubin Urine: NEGATIVE
Glucose, UA: NEGATIVE mg/dL
Hgb urine dipstick: NEGATIVE
Ketones, ur: NEGATIVE mg/dL
Leukocytes, UA: NEGATIVE
Nitrite: NEGATIVE
PH: 9 — AB (ref 5.0–8.0)
Protein, ur: 30 mg/dL — AB
SPECIFIC GRAVITY, URINE: 1.011 (ref 1.005–1.030)

## 2018-06-30 LAB — BASIC METABOLIC PANEL
Anion gap: 11 (ref 5–15)
BUN: 15 mg/dL (ref 8–23)
CALCIUM: 9 mg/dL (ref 8.9–10.3)
CHLORIDE: 100 mmol/L (ref 98–111)
CO2: 23 mmol/L (ref 22–32)
Creatinine, Ser: 0.7 mg/dL (ref 0.44–1.00)
GFR calc Af Amer: >60 mL/min (ref >60)
GFR calc non Af Amer: >60 mL/min (ref >60)
Glucose, Bld: 99 mg/dL (ref 70–99)
Potassium: 3.1 mmol/L — ABNORMAL LOW (ref 3.5–5.1)
SODIUM: 134 mmol/L — AB (ref 135–145)

## 2018-06-30 LAB — CBC WITH DIFFERENTIAL/PLATELET
Abs Immature Granulocytes: 0.02 10*3/uL (ref 0.00–0.07)
BASOS ABS: 0 10*3/uL (ref 0.0–0.1)
BASOS PCT: 0 %
EOS ABS: 0 10*3/uL (ref 0.0–0.5)
EOS PCT: 0 %
HCT: 27.8 % — ABNORMAL LOW (ref 36.0–46.0)
HEMOGLOBIN: 9.3 g/dL — AB (ref 12.0–15.0)
Immature Granulocytes: 0 %
LYMPHS PCT: 12 %
Lymphs Abs: 0.9 10*3/uL (ref 0.7–4.0)
MCH: 33.7 pg (ref 26.0–34.0)
MCHC: 33.5 g/dL (ref 30.0–36.0)
MCV: 100.7 fL — ABNORMAL HIGH (ref 80.0–100.0)
MONO ABS: 0.6 10*3/uL (ref 0.1–1.0)
Monocytes Relative: 9 %
NRBC: 0 % (ref 0.0–0.2)
Neutro Abs: 5.7 10*3/uL (ref 1.7–7.7)
Neutrophils Relative %: 79 %
Platelets: 251 10*3/uL (ref 150–400)
RBC: 2.76 MIL/uL — AB (ref 3.87–5.11)
RDW: 13.9 % (ref 11.5–15.5)
WBC: 7.2 10*3/uL (ref 4.0–10.5)

## 2018-06-30 MED ORDER — ASPIRIN EC 81 MG PO TBEC
81.0000 mg | DELAYED_RELEASE_TABLET | Freq: Every day | ORAL | Status: DC
  Administered 2018-07-01 – 2018-07-02 (×2): 81 mg via ORAL
  Filled 2018-06-30 (×2): qty 1

## 2018-06-30 MED ORDER — SERTRALINE HCL 25 MG PO TABS
25.0000 mg | ORAL_TABLET | Freq: Every day | ORAL | Status: DC
  Administered 2018-07-01 – 2018-07-02 (×2): 25 mg via ORAL
  Filled 2018-06-30 (×2): qty 1

## 2018-06-30 MED ORDER — LORAZEPAM 0.5 MG PO TABS: 0.2500 mg | ORAL_TABLET | ORAL | Status: DC

## 2018-06-30 MED ORDER — LORAZEPAM 0.5 MG PO TABS: 0.5000 mg | ORAL_TABLET | Freq: Two times a day (BID) | ORAL | Status: DC | PRN

## 2018-06-30 MED ORDER — SERTRALINE HCL 25 MG PO TABS: 25.0000 mg | ORAL_TABLET | ORAL | Status: DC

## 2018-06-30 MED ORDER — LORAZEPAM 0.5 MG PO TABS
0.2500 mg | ORAL_TABLET | Freq: Every day | ORAL | Status: DC
  Administered 2018-07-02: 0.25 mg via ORAL
  Filled 2018-06-30 (×2): qty 1

## 2018-06-30 MED ORDER — OXYCODONE HCL 5 MG PO TABS
5.0000 mg | ORAL_TABLET | ORAL | Status: DC | PRN
  Administered 2018-07-01: 5 mg via ORAL
  Filled 2018-06-30: qty 1

## 2018-06-30 MED ORDER — MEMANTINE HCL 10 MG PO TABS
10.0000 mg | ORAL_TABLET | Freq: Two times a day (BID) | ORAL | Status: DC
  Administered 2018-07-01: 10 mg via ORAL
  Filled 2018-06-30: qty 1

## 2018-06-30 MED ORDER — MONTELUKAST SODIUM 10 MG PO TABS
10.0000 mg | ORAL_TABLET | Freq: Every day | ORAL | Status: DC
  Administered 2018-06-30 – 2018-07-01 (×2): 10 mg via ORAL
  Filled 2018-06-30 (×2): qty 1

## 2018-06-30 MED ORDER — ACETAMINOPHEN 325 MG PO TABS
650.0000 mg | ORAL_TABLET | Freq: Four times a day (QID) | ORAL | Status: DC | PRN
  Administered 2018-07-01: 650 mg via ORAL
  Filled 2018-06-30: qty 2

## 2018-06-30 MED ORDER — LORAZEPAM 0.5 MG PO TABS
0.5000 mg | ORAL_TABLET | Freq: Every day | ORAL | Status: DC
  Administered 2018-06-30 – 2018-07-01 (×2): 0.5 mg via ORAL
  Filled 2018-06-30 (×2): qty 1

## 2018-06-30 MED ORDER — FENTANYL CITRATE (PF) 100 MCG/2ML IJ SOLN
12.5000 ug | Freq: Once | INTRAMUSCULAR | Status: AC
  Administered 2018-06-30: 12.5 ug via INTRAVENOUS
  Filled 2018-06-30: qty 2

## 2018-06-30 MED ORDER — SERTRALINE HCL 50 MG PO TABS
50.0000 mg | ORAL_TABLET | Freq: Every day | ORAL | Status: DC
  Administered 2018-06-30 – 2018-07-01 (×2): 50 mg via ORAL
  Filled 2018-06-30 (×2): qty 1

## 2018-06-30 MED ORDER — POTASSIUM CHLORIDE 20 MEQ/15ML (10%) PO SOLN
40.0000 meq | Freq: Once | ORAL | Status: AC
  Administered 2018-07-01: 40 meq via ORAL
  Filled 2018-06-30: qty 30

## 2018-06-30 MED ORDER — CLOPIDOGREL BISULFATE 75 MG PO TABS
75.0000 mg | ORAL_TABLET | Freq: Every day | ORAL | Status: DC
  Administered 2018-07-01 – 2018-07-02 (×2): 75 mg via ORAL
  Filled 2018-06-30 (×2): qty 1

## 2018-06-30 MED ORDER — CARVEDILOL 3.125 MG PO TABS
3.1250 mg | ORAL_TABLET | Freq: Two times a day (BID) | ORAL | Status: DC
  Administered 2018-07-01 – 2018-07-02 (×4): 3.125 mg via ORAL
  Filled 2018-06-30 (×4): qty 1

## 2018-06-30 MED ORDER — SODIUM CHLORIDE 0.9% FLUSH
10.0000 mL | INTRAVENOUS | Status: DC | PRN
  Administered 2018-07-01: 20 mL
  Filled 2018-06-30: qty 40

## 2018-06-30 MED ORDER — BACLOFEN 10 MG PO TABS
5.0000 mg | ORAL_TABLET | Freq: Three times a day (TID) | ORAL | Status: DC | PRN
  Administered 2018-07-01: 5 mg via ORAL
  Filled 2018-06-30: qty 1

## 2018-06-30 MED ORDER — ACETAMINOPHEN 650 MG RE SUPP: 650.0000 mg | Freq: Four times a day (QID) | RECTAL | Status: DC | PRN

## 2018-06-30 MED ORDER — SODIUM CHLORIDE 0.9% FLUSH
10.0000 mL | Freq: Two times a day (BID) | INTRAVENOUS | Status: DC
  Administered 2018-06-30: 10 mL
  Administered 2018-07-01: 3 mL
  Administered 2018-07-01 – 2018-07-02 (×2): 10 mL

## 2018-06-30 MED ORDER — POTASSIUM CHLORIDE IN NACL 20-0.9 MEQ/L-% IV SOLN
INTRAVENOUS | Status: AC
  Administered 2018-06-30: via INTRAVENOUS
  Filled 2018-06-30 (×2): qty 1000

## 2018-06-30 MED ORDER — MIRABEGRON ER 50 MG PO TB24
50.0000 mg | ORAL_TABLET | Freq: Every day | ORAL | Status: DC
  Administered 2018-07-02: 50 mg via ORAL
  Filled 2018-06-30 (×2): qty 1

## 2018-06-30 NOTE — ED Notes (Signed)
Patient transported to MRI 

## 2018-06-30 NOTE — ED Provider Notes (Signed)
Care assumed from Dr. Ellender Hose at shift change.  Patient awaiting results of MRI of lumbar spine and hip.  These revealed a high-grade partial to full-thickness tear of the iliopsoas muscle near its insertion site on the lesser trochanter of the left femur.  There is also hematoma next to the iliac [name] muscle.  The hip bones appear to be okay.  Patient continues with severe pain and will require admission for pain control.  I have spoken with Dr. Posey Pronto who agrees to admit.  Orthopedic consultation pending.   Veryl Speak, MD 06/30/18 [phone]

## 2018-06-30 NOTE — ED Notes (Signed)
Attempted report 

## 2018-06-30 NOTE — H&P (Signed)
History and Physical    [name] PQZ:[id number] DOB: 11/07/1931 DOA: 06/30/2018  PCP: [place], [place]  Patient coming from: [facility]  I have personally briefly reviewed patient's old medical records in [place]  Chief Complaint: Left hip pain  HPI: [name] is a 85 y.o. female with medical history significant for CAD s/p CABG, Multiple Myeloma, HTN, HLD, GAD/Depression, and IBS who presents to the ED with left hip pain.    Patient reports having a fall 2 months ago with lingering left leg pain and associated decreased mobility.  Plain films of her left hip were negative for fracture or acute abnormality.  She was told she had a pinched nerve causing her symptoms.  She was seen by orthopedics on 06/23/2018 who reviewed her lumbar and AP pelvis films and felt her symptoms were related to spinal stenosis and radiculopathy.  She was started on a Medrol dose pack and Neurontin.  An MRI of the lumbar spine was ordered which patient says she was to have completed on 07/02/2018.    She says early this morning at her ALF she was in bed was having worsening pain in her left hip and while attempting to crawl across her bed to call her nurse she noticed a popping sensation in her left hip with severe pain and intermittent spasms.  She was unable to lift her left leg or bear weight.  She called her granddaughter who is a emergency department nurse who recommended she be evaluated in the ED.  She denies any associated chest pain, dyspnea, abdominal pain, or dysuria.  She normally ambulates with the use of a Rollator.  ED Course:  Initial vitals showed BP 168/63, pulse 82, RR 16, temp 98.8 Fahrenheit, SPO2 99% on room air.  Labs are notable for WBC 7.2, hemoglobin 9.30 (baseline 9-10), platelets 251, potassium 3.1, urinalysis negative for hemoglobin, nitrites, or leukocytes, rare bacteria seen on microscopy.  X-ray of the left hip was negative for acute fracture  or dislocation.   MRI of the left hip without contrast showed a high-grade partial or full-thickness tear of the iliopsoas near the insertion at the lesser trochanter of the left femur.  Intramuscular edema was seen.  Changes suggestive of left ischial bursitis and/or posttraumatic hematoma were also seen.  No acute fracture left hip noted.  MRI lumbar spine showed changes suggestive of hemorrhage at the left iliac is muscle due to muscle tear and severe spinal stenosis at L4-L5.  Patient was given IV fentanyl for pain and the hospitalist service was consulted to admit for pain control.  Orthopedics were consulted by the ED physician, Dr. Stark Jock, which is pending.  Review of Systems: As per HPI otherwise 10 point review of systems negative.    Past Medical History:  Diagnosis Date  . Anxiety   . Arthritis   . CAD (coronary artery disease)   . Chondrocostal junction syndrome   . Chronic fatigue   . Dementia ([place])    without Behavioral Disturbance  . GERD (gastroesophageal reflux disease)   . Hx of CABG   . Hyperlipidemia    GOAL LDL <70  . Hypertension   . IBS (irritable bowel syndrome)   . Incontinent of urine   . Jaundice    yellow jaundice 3rd grade  . Major depressive disorder   . Mild chronic anemia   . Mixed incontinence   . Multiple myeloma (H[facility])    not having achieved remission  .  NSTEMI (non-ST elevated myocardial infarction) ([place]) 01/31/2017  . Osteopenia   . Ovarian cyst, bilateral    Rt complex  . Overactive bladder   . Panic disorder   . Pulmonary nodules   . Radiculopathy   . Raynaud's syndrome   . Restless legs syndrome   . Spinal stenosis of lumbar region   . Thyroid nodule   . Unsteadiness on feet     Past Surgical History:  Procedure Laterality Date  . BUNIONECTOMY WITH HAMMERTOE RECONSTRUCTION Right 08/04/2012   Procedure:  HAMMERTOE RECONSTRUCTION;  Surgeon: Colin Rhein, MD;  Location: [facility];  Service: Orthopedics;   Laterality: Right;  HAMMER TOE RECONSTRUCTION PROBABLE FLEXOR DIGITORUM LONGUS TO PROXIMAL PHALANX TRANSFER LATERALIZED   . CAPSULOTOMY Right 08/04/2012   Procedure: CAPSULOTOMY;  Surgeon: Colin Rhein, MD;  Location: [facility];  Service: Orthopedics;  Laterality: Right;  RIGHT 2ND TOE METATARSOPHALANGEAL JOINT DORSAL CAPSULOTOMY   . CATARACT EXTRACTION, BILATERAL     both cataracts  . COLONOSCOPY    . CORONARY ANGIOPLASTY WITH STENT PLACEMENT  2014   2 stents  . CORONARY ARTERY BYPASS GRAFT  2007   LIMA to LAD, SVG to RCA  non-ST segment MI 08/2009 (Dr. Tamala Julian)  . CORONARY STENT INTERVENTION N/A 02/02/2017   Procedure: CORONARY STENT INTERVENTION;  Surgeon: Belva Crome, MD;  Location: [facility];  Service: Cardiovascular;  Laterality: N/A;  Prox CFX  . DILATION AND CURETTAGE OF UTERUS    . LEFT HEART CATH AND CORS/GRAFTS ANGIOGRAPHY N/A 02/02/2017   Procedure: LEFT HEART CATH AND CORS/GRAFTS ANGIOGRAPHY;  Surgeon: Belva Crome, MD;  Location: [facility];  Service: Cardiovascular;  Laterality: N/A;  . LEFT HEART CATHETERIZATION WITH CORONARY/GRAFT ANGIOGRAM N/A 07/09/2012   Procedure: LEFT HEART CATHETERIZATION WITH Beatrix Fetters;  Surgeon: Sinclair Grooms, MD;  [facility]: [facility];  Service: Cardiovascular;  Laterality: N/A;  . TONSILLECTOMY AND ADENOIDECTOMY    . UPPER GASTROINTESTINAL ENDOSCOPY    . VISCERAL ANGIOGRAPHY N/A 04/15/2017   Procedure: VISCERAL ANGIOGRAPHY;  Surgeon: Waynetta Sandy, MD;  Location: [facility];  Service: Cardiovascular;  Laterality: N/A;     reports that she has never smoked. She has never used smokeless tobacco. She reports that she does not drink alcohol or use drugs.  Allergies  Allergen Reactions  . Butazolidin [Phenylbutazone] Swelling and Rash  . Cephalosporins Other (See Comments)    Site of swelling not recalled  . Codeine Nausea And Vomiting  . Levaquin [Levofloxacin] Shortness Of  Breath  . Macrodantin Other (See Comments)    Double vision  . Morphine And Related Other (See Comments)    Hallucinations   . Trimethoprim Nausea Only  . Aleve [Naproxen] Other (See Comments)    Was told by a MD to "never take this"  . Buprenorphine Other (See Comments)    "Allergic," per MAR  . Cilostazol Other (See Comments)    Stomach upset  . Dexilant [Dexlansoprazole] Diarrhea  . Diclofenac Other (See Comments)    Documented on MAR  . Erythromycin Nausea And Vomiting    All "mycin" drugs  . Lactose Intolerance (Gi) Diarrhea  . Morphine Other (See Comments)    Reaction not recalled  . Simvastatin Other (See Comments)    Muscle aches   . Sulfa Antibiotics Other (See Comments)    "Allergic," per MAR  . Vesicare [Solifenacin] Other (See Comments)    Reaction not recalled by the  patient- gets a lot of UTI(s)  . Clindamycin/Lincomycin Other (See Comments)    Upsets the stomach  . Doxycycline Diarrhea  . Food Other (See Comments)    No spicy foods or black pepper, raw onions - causes gerd  . Ibuprofen Hives and Rash    Was told by a MD to "never take this"  . Lincomycin Hcl Nausea Only and Other (See Comments)    Upsets the stomach  . Penicillins Rash    Has patient had a PCN reaction causing immediate rash, facial/tongue/throat swelling, SOB or lightheadedness with hypotension: Yes Has patient had a PCN reaction causing severe rash involving mucus membranes or skin necrosis: No Has patient had a PCN reaction that required hospitalization No Has patient had a PCN reaction occurring within the last 10 years: No If all of the above answers are "NO", then may proceed with Cephalosporin use.   Lisbeth Ply [Fesoterodine Fumarate Er] Other (See Comments)    Reaction not recalled  . Voltaren [Diclofenac Sodium] Rash    Family History  Problem Relation Age of Onset  . Heart attack Mother   . CAD Mother   . Obesity Sister   . Breast cancer Paternal Aunt   . Ulcers Father     . Asthma Neg Hx   . Eczema Neg Hx   . Urticaria Neg Hx   . Allergic rhinitis Neg Hx   . Angioedema Neg Hx   . Atopy Neg Hx   . Immunodeficiency Neg Hx      Prior to Admission medications   Medication Sig Start Date End Date Taking? Authorizing Provider  acetaminophen (TYLENOL) 650 MG CR tablet Take 1,300 mg by mouth 2 (two) times daily.    [provider]  Alum Hydroxide-Mag Carbonate (GAVISCON EXTRA STRENGTH) [phone] MG/10ML SUSP Take by mouth. Takes 4 times daily as needed    [provider]  aspirin EC 81 MG tablet Take 1 tablet (81 mg total) by mouth daily. 01/21/16   Minus Breeding, MD  Biotin 5000 MCG TABS Take 5,000 mcg by mouth daily.    [provider]  Calcium Carb-Cholecalciferol (CALCIUM 500 + D3) 500-600 MG-UNIT TABS Take 1 tablet by mouth daily.    [provider]  carvedilol (COREG) 3.125 MG tablet Take 1 tablet (3.125 mg total) by mouth 2 (two) times daily with a meal. 01/05/18   Minus Breeding, MD  clopidogrel (PLAVIX) 75 MG tablet Take last dose of Brilinta 04/01/17 in evening. Take 4 tablets of Clopidogrel on 04/02/17 then take 1 tablet by mouth daily thereafter. Patient taking differently: Take 75 mg by mouth daily.  04/01/17   Minus Breeding, MD  cycloSPORINE (RESTASIS) 0.05 % ophthalmic emulsion 1 drop 2 (two) times daily.    [provider]  docusate sodium (COLACE) 100 MG capsule Take 100 mg by mouth daily.    [provider]  hydrALAZINE (APRESOLINE) 10 MG tablet Take 10 mg by mouth 3 (three) times daily as needed.    [provider]  hydrALAZINE (APRESOLINE) 10 MG tablet Take 1 tablet (10 mg total) by mouth daily at 3 pm. 05/26/18   Almyra Deforest, PA  hydrALAZINE (APRESOLINE) 25 MG tablet Take 1 tablet (25 mg total) by mouth 2 (two) times daily. 8 am and 10 pm 05/26/18   Almyra Deforest, PA  Hyoscyamine Sulfate SL (LEVSIN/SL) 0.125 MG SUBL Take 1 tablet, dissolve under the tongue every 6 hours as needed for  abdominal pain. 09/09/17   [place], [place]  S, PA-C  iron polysaccharides (NU-IRON) 150 MG capsule Take 150 mg by mouth 2 (two) times daily.     [provider]  isosorbide mononitrate (IMDUR) 60 MG 24 hr tablet Take 60 mg by mouth daily.    [provider]  levocetirizine (XYZAL) 5 MG tablet Take 1 tablet (5 mg total) by mouth every evening. Patient taking differently: Take 5 mg by mouth at bedtime.  05/13/17   Charlies Silvers, MD  LORazepam (ATIVAN) 0.5 MG tablet Take 0.25-0.5 mg by mouth See admin instructions. Take 0.25 mg in the morning and 0.43m at bedtime.  May take an additional 0.5 mg twice daily as needed for anxiety 01/09/17   [provider]  Melatonin 5 MG TABS Take 5 mg by mouth at bedtime.    [provider]  memantine (NAMENDA) 5 MG tablet Take 5 mg by mouth daily.    [provider]  mirabegron ER (MYRBETRIQ) 50 MG TB24 tablet Take 50 mg by mouth daily.    [provider]  montelukast (SINGULAIR) 10 MG tablet Take 10 mg by mouth at bedtime.     [provider]  Multiple Vitamins-Minerals (CENTRUM SILVER 50+WOMEN) TABS Take 1 tablet by mouth daily.    [provider]  neomycin-polymyxin-hydrocortisone (CORTISPORIN) OTIC solution Apply 1-2 drops to both toes once daily until all used 04/20/18   SLandis Martins DPM  nitroGLYCERIN (NITROSTAT) 0.4 MG SL tablet Place 1 tablet (0.4 mg total) under the tongue every 5 (five) minutes as needed for chest pain. Reported on 11/12/2015 02/03/17   SLyda JesterM, PA-C  Peppermint Oil (IBGARD) 90 MG CPCR Take 2 capsules by mouth as directed. Per box instructions 02/23/18   Pyrtle, JLajuan Lines MD  polyethylene glycol (MIRALAX / GLYCOLAX) packet Take 17 g by mouth 2 (two) times daily. 02/23/18   Pyrtle, JLajuan Lines MD  PREMARIN vaginal cream Place 1 Applicatorful vaginally once a week.  05/05/17   [provider]  Probiotic Product (VSL#3 PO) Take 1 packet by mouth every morning.     [provider]  psyllium (HYDROCIL/METAMUCIL) 95 % PACK Take 1 packet by mouth daily.    [provider]  sertraline (ZOLOFT) 25 MG tablet Take 25-50 mg by mouth See admin instructions. Takes 255mpo AM and 5030mo PM.     [provider]    Physical Exam: Vitals:   06/30/18 1830 06/30/18 1900 06/30/18 2006 06/30/18 2142  BP: (!) 157/75 (!) 154/68 (!) 161/115 (!) 146/75  Pulse: 79 79 79 76  Resp: '20 18 16   ' Temp:   98.5 F (36.9 C)   TempSrc:   Oral   SpO2: 100% 99% 99%     Constitutional: Elderly woman resting supine in bed, NAD, calm, comfortable Eyes: PERRL, lids and conjunctivae normal ENMT: Mucous membranes are moist. Posterior pharynx clear of any exudate or lesions. Normal dentition.  Neck: normal, supple, no masses. Respiratory: clear to auscultation bilaterally, no wheezing, no crackles. Normal respiratory effort. No accessory muscle use.  Cardiovascular: Regular rate and rhythm, no murmurs / rubs / gallops. No extremity edema. Abdomen: no tenderness, no masses palpated. No hepatosplenomegaly. Musculoskeletal: Range of motion left lower extremity decreased due to pain, unable to lift off bed passively.  Right lower extremity range of motion intact. Skin: no rashes, lesions, ulcers. No induration Neurologic: CN 2-12 grossly intact. Sensation intact. Strength diminished in left lower extremity due to left hip pain, intact in all other extremities..Marland Kitchen  Psychiatric: Normal judgment and insight. Alert and oriented x 3. Normal mood.    Labs on Admission: I have personally reviewed following labs and imaging studies  CBC: Recent Labs  Lab 06/30/18 1406  WBC 7.2  NEUTROABS 5.7  HGB 9.3*  HCT 27.8*  MCV 100.7*  PLT 546   Basic Metabolic Panel: Recent Labs  Lab 06/30/18 1406  NA 134*  K 3.1*  CL 100  CO2 23  GLUCOSE 99  BUN 15  CREATININE 0.70  CALCIUM 9.0   GFR: CrCl cannot be calculated (Unknown ideal weight.). Liver Function  Tests: No results for input(s): AST, ALT, ALKPHOS, BILITOT, PROT, ALBUMIN in the last 168 hours. No results for input(s): LIPASE, AMYLASE in the last 168 hours. No results for input(s): AMMONIA in the last 168 hours. Coagulation Profile: No results for input(s): INR, PROTIME in the last 168 hours. Cardiac Enzymes: No results for input(s): CKTOTAL, CKMB, CKMBINDEX, TROPONINI in the last 168 hours. BNP (last 3 results) No results for input(s): PROBNP in the last 8760 hours. HbA1C: No results for input(s): HGBA1C in the last 72 hours. CBG: No results for input(s): GLUCAP in the last 168 hours. Lipid Profile: No results for input(s): CHOL, HDL, LDLCALC, TRIG, CHOLHDL, LDLDIRECT in the last 72 hours. Thyroid Function Tests: No results for input(s): TSH, T4TOTAL, FREET4, T3FREE, THYROIDAB in the last 72 hours. Anemia Panel: No results for input(s): VITAMINB12, FOLATE, FERRITIN, TIBC, IRON, RETICCTPCT in the last 72 hours. Urine analysis:    Component Value Date/Time   COLORURINE YELLOW 06/30/2018 1550   APPEARANCEUR HAZY (A) 06/30/2018 1550   LABSPEC 1.011 06/30/2018 1550   PHURINE 9.0 (H) 06/30/2018 1550   GLUCOSEU NEGATIVE 06/30/2018 1550   HGBUR NEGATIVE 06/30/2018 1550   BILIRUBINUR NEGATIVE 06/30/2018 1550   KETONESUR NEGATIVE 06/30/2018 1550   PROTEINUR 30 (A) 06/30/2018 1550   UROBILINOGEN 0.2 06/15/2014 1450   NITRITE NEGATIVE 06/30/2018 1550   LEUKOCYTESUR NEGATIVE 06/30/2018 1550    Radiological Exams on Admission: Mr Lumbar Spine Wo Contrast  Result Date: 06/30/2018 CLINICAL DATA:  Left hip pain.  No injury EXAM: MRI LUMBAR SPINE WITHOUT CONTRAST TECHNIQUE: Multiplanar, multisequence MR imaging of the lumbar spine was performed. No intravenous contrast was administered. COMPARISON:  CT abdomen pelvis 07/23/2017.  MRI left hip today FINDINGS: Segmentation:  Normal Alignment:  5 mm anterolisthesis L5-S1 Vertebrae:  Negative for lumbar fracture or mass. Conus medullaris and  cauda equina: Conus extends to the L1-2 level. Conus and cauda equina appear normal. Paraspinal and other soft tissues: Significant enlargement of the left iliacus muscle. Diffuse edema is present. Left psoas muscle normal. Review of the prior MRI of the left hip reveals edema extends into the ileal psoas insertion where there appears to be a muscle tear. This is most likely hemorrhage in the muscle. Disc levels: L1-2: Disc degeneration and spurring with right foraminal narrowing L2-3: Disc degeneration and spurring with mild spinal stenosis and mild foraminal stenosis bilaterally L3-4: Disc degeneration and facet degeneration. Mild spinal stenosis and mild foraminal stenosis bilaterally due to spurring L4-5: Severe spinal stenosis with diffuse disc bulging and bilateral facet hypertrophy. L5-S1: 5 mm anterolisthesis with severe facet degeneration. Negative for stenosis. IMPRESSION: 1. Significant enlargement left iliacus muscle compatible with hemorrhage related to muscle tear in the left groin. 2. Multilevel lumbar degenerative change with severe spinal stenosis L4-5. 3. These results were called by telephone at the time of interpretation on 06/30/2018 at 5:09 pm to Dr. Stark Jock , who verbally acknowledged  these results. Electronically Signed   By: Franchot Gallo M.D.   On: 06/30/2018 17:11   Mr Hip Left Wo Contrast  Result Date: 06/30/2018 CLINICAL DATA:  Left hip pain, query fracture. EXAM: MR OF THE LEFT HIP WITHOUT CONTRAST TECHNIQUE: Multiplanar, multisequence MR imaging was performed. No intravenous contrast was administered. COMPARISON:  06/30/2018 plain radiographs FINDINGS: Bones: No marrow signal abnormality indicative acute pelvic nor hip fracture to the extent included. The sacrum is incompletely included on this study. No pelvic diastasis is identified. The sacroiliac joints and pubic symphysis are intact. Articular cartilage and labrum Articular cartilage:  Moderate cartilaginous thinning. Labrum:  No  labral tear identified. Joint or bursal effusion Joint effusion:  Trace physiologic fluid within both hip joints. Bursae: Small amount of fluid adjacent to the left ischial tuberosity. Muscles and tendons Muscles and tendons: High-grade partial to full-thickness tear of the distal iliopsoas near its insertion on the lesser trochanter, series 4/26. Extensive intramuscular edema is noted consistent with a muscle strain. Additionally, strains of the abductor muscles are noted. Other findings Miscellaneous:   None IMPRESSION: 1. High-grade partial to full-thickness tear of the iliopsoas near its insertion on the lesser trochanter of the left femur. Intramuscular edema consistent with muscle strains of the left iliopsoas and adductor muscles. 2. Fluid adjacent to the left ischial tuberosity raise concern for left ischial bursitis and/or posttraumatic hematoma. 3. No acute fracture of the left hip.  No pelvic diastasis. Electronically Signed   By: Ashley Royalty M.D.   On: 06/30/2018 16:44   Dg Hip Unilat With Pelvis 2-3 Views Left  Result Date: 06/30/2018 CLINICAL DATA:  Left hip pain, unable to stand. EXAM: DG HIP (WITH OR WITHOUT PELVIS) 3V LEFT COMPARISON:  04/28/2018 FINDINGS: Pelvic ring is intact. Degenerative changes of lumbar spine are noted. No acute fracture or dislocation is seen no soft tissue abnormality is noted. IMPRESSION: No acute abnormality noted. Electronically Signed   By: Inez Catalina M.D.   On: 06/30/2018 13:40    EKG: Not obtained  Assessment/Plan Principal Problem:   Left hip pain Active Problems:   Hypertension   Depression   Generalized anxiety disorder   Hypokalemia   Coronary artery disease  [name] is a 85 y.o. female with medical history significant for CAD s/p CABG, Multiple Myeloma, HTN, HLD, GAD/Depression, and IBS who presents to the ED with left hip pain found to have a high-grade partial or full-thickness tear at the left iliopsoas muscle.   Left iliopsoas  muscle tear: High-grade partial or full-thickness tear of the left iliopsoas muscle seen on MRI. -Pain control with as needed Tylenol, OxyIR, baclofen -PT/OT eval  CAD s/p CABG: No active chest pain. -Continue aspirin, Plavix, Imdur  Hypertension: Currently controlled. -Continue home Coreg, Imdur  Hypokalemia: K 3.1 on admission.  Repleting, will recheck in a.m.  GAD/depression: Continue home sertraline and Ativan.  DVT prophylaxis: SCDs Code Status: DNR, confirmed with patient Family Communication: Discussed at bedside with patient's daughter Disposition Plan: Pending PT/OT eval Consults called: None Admission status: Observation   Zada Finders MD [facility] Pager [phone]  If 7PM-7AM, please contact night-coverage [email or web address]  07/01/2018, 12:04 AM

## 2018-06-30 NOTE — ED Provider Notes (Signed)
[facility] EMERGENCY DEPARTMENT Provider Note   CSN: [id number] Arrival date & time: 06/30/18  1115     History   Chief Complaint Chief Complaint  Patient presents with  . Hip Pain    HPI [name] is a 85 y.o. female.  HPI   85 yo F with PMHx as below here with left hip pain. Pt has had ongoing hip pain x 3 weeks. Seen by PCP and Ortho. She has had neg plain films. Last night, she felt fine going to bed. She got up to try to move to another part of the bed to get a remote when she felt a popping sensation. She has since bene unable to ambulate or move her leg without severe pain. She has exquisite pain with any passive or active ROM. Denies any associated numbness or tingling. Denies recent falls, and she was able to exercise yesterday without problem. No recent falls. Pain alleviated with rest only, worsened w/ any movement. Denies any head injuries or pain. No new back pain.  Past Medical History:  Diagnosis Date  . Anxiety   . Arthritis   . CAD (coronary artery disease)   . Chondrocostal junction syndrome   . Chronic fatigue   . Dementia ([place])    without Behavioral Disturbance  . GERD (gastroesophageal reflux disease)   . Hx of CABG   . Hyperlipidemia    GOAL LDL <70  . Hypertension   . IBS (irritable bowel syndrome)   . Incontinent of urine   . Jaundice    yellow jaundice 3rd grade  . Major depressive disorder   . Mild chronic anemia   . Mixed incontinence   . Multiple myeloma (HCC)    not having achieved remission  . NSTEMI (non-ST elevated myocardial infarction) ([place]) 01/31/2017  . Osteopenia   . Ovarian cyst, bilateral    Rt complex  . Overactive bladder   . Panic disorder   . Pulmonary nodules   . Radiculopathy   . Raynaud's syndrome   . Restless legs syndrome   . Spinal stenosis of lumbar region   . Thyroid nodule   . Unsteadiness on feet     Patient Active Problem List   Diagnosis Date Noted  . Left thyroid nodule  08/29/2017  . Chronic idiopathic constipation 08/18/2017  . Abdominal pain 07/31/2017  . Mesenteric ischemia ([place]) 05/04/2017  . Unsteadiness on feet   . Unstable angina pectoris ([place])   . Tinea pedis   . Spinal stenosis of lumbar region   . Somnolence   . Seasonal allergic rhinitis   . Restless legs syndrome   . Raynaud's syndrome   . Raynauds syndrome   . Radiculopathy   . Panic disorder   . Ovarian cyst, bilateral   . Osteopenia   . Myocardial infarction ([place])   . Muscle weakness (generalized)   . Mixed incontinence   . Mild chronic anemia   . Major depressive disorder   . Long term (current) use of aspirin   . Intermediate coronary syndrome ([place])   . Incontinent of urine   . IBS (irritable bowel syndrome)   . Hypercholesteremia   . Hx of CABG   . GERD (gastroesophageal reflux disease)   . Dementia ([place])   . Chest pain   . Atherosclerotic heart disease of native coronary artery without angina pectoris   . Arthritis   . Anxiety   . Allergic rhinitis   . Abnormal weight loss   .  History of adenomatous polyp of colon 03/02/2017  . NSTEMI (non-ST elevated myocardial infarction) ([place]) 02/02/2017  . SOB (shortness of breath) 09/02/2016  . Dizziness 09/02/2016  . Non-ST elevation (NSTEMI) myocardial infarction ([place]) 11/26/2015  . Polymyalgia rheumatica ([place]) 11/26/2015  . Hypokalemia 11/26/2015  . Mild persistent asthma 11/12/2015  . Coronary artery disease involving autologous artery coronary bypass graft with angina pectoris with documented spasm ([place]) 11/12/2015  . Multiple myeloma in remission ([place]) 11/12/2015  . Current use of beta blocker 11/12/2015  . Gastroesophageal reflux disease without esophagitis 11/12/2015  . Other allergic rhinitis 11/12/2015  . Weight loss, non-intentional 10/03/2015  . Asthma 09/24/2015  . Asthma in adult without complication 12/75/1700  . Seasonal allergic rhinitis due to pollen 09/24/2015  . Cervical radiculopathy 07/25/2015  .  Chronic coronary artery disease 07/25/2015  . Coronary artery disease 07/25/2015  . Disturbance in sleep behavior 07/25/2015  . Elevated CK 07/25/2015  . Elevated creatine kinase level 07/25/2015  . Generalized osteoarthritis of hand 07/25/2015  . Other long term (current) drug therapy 07/25/2015  . Overactive bladder 07/25/2015  . Restless leg 07/25/2015  . Restless leg syndrome 07/25/2015  . Sleep disturbances 07/25/2015  . Idiopathic peripheral neuropathy 07/04/2015  . Lumbar radiculopathy, chronic 07/04/2015  . 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 Risk for falls 07/04/2015  . Sleep disturbance 07/04/2015  . Chest pain with moderate risk for cardiac etiology 06/13/2015  . Right sciatic nerve pain 10/19/2014  . Abnormal thyroid blood test 08/25/2014  . Chronic fatigue 08/09/2014  . Multiple myeloma ([place]) 07/21/2013  . Depression 01/14/2013  . Generalized anxiety disorder 01/14/2013  . Gastroesophageal reflux disease 01/13/2013  . Urine test positive for microalbuminuria 12/08/2011  . Cyst of ovary 12/17/2010  . KNEE PAIN, BILATERAL 08/15/2010  . Abnormal gait 08/15/2010  . Atherosclerosis of coronary artery bypass graft 10/11/2009  . CHEST PAIN, NON-CARDIAC 10/11/2009  . HIP PAIN, RIGHT 08/13/2009  . GREATER TROCHANTERIC BURSITIS 08/13/2009  . LUMBOSACRAL SPONDYLOSIS WITHOUT MYELOPATHY 11/10/2008  . Osteoarthritis of lumbosacral spine without myelopathy 11/10/2008  . FOOT PAIN, BILATERAL 06/30/2008  . Hyperlipidemia 06/22/2008  . Hypertension 06/22/2008  . Raynaud's disease 06/22/2008  . BURSITIS, LEFT SHOULDER 06/22/2008    Past Surgical History:  Procedure Laterality Date  . BUNIONECTOMY WITH HAMMERTOE RECONSTRUCTION Right 08/04/2012   Procedure:  HAMMERTOE RECONSTRUCTION;  Surgeon: Colin Rhein, MD;  Location: [facility];  Service: Orthopedics;  Laterality: Right;  HAMMER TOE RECONSTRUCTION PROBABLE FLEXOR DIGITORUM LONGUS TO PROXIMAL PHALANX TRANSFER  LATERALIZED   . CAPSULOTOMY Right 08/04/2012   Procedure: CAPSULOTOMY;  Surgeon: Colin Rhein, MD;  Location: [facility];  Service: Orthopedics;  Laterality: Right;  RIGHT 2ND TOE METATARSOPHALANGEAL JOINT DORSAL CAPSULOTOMY   . CATARACT EXTRACTION, BILATERAL     both cataracts  . COLONOSCOPY    . CORONARY ANGIOPLASTY WITH STENT PLACEMENT  2014   2 stents  . CORONARY ARTERY BYPASS GRAFT  2007   LIMA to LAD, SVG to RCA  non-ST segment MI 08/2009 (Dr. Tamala Julian)  . CORONARY STENT INTERVENTION N/A 02/02/2017   Procedure: CORONARY STENT INTERVENTION;  Surgeon: Belva Crome, MD;  Location: [facility];  Service: Cardiovascular;  Laterality: N/A;  Prox CFX  . DILATION AND CURETTAGE OF UTERUS    . LEFT HEART CATH AND CORS/GRAFTS ANGIOGRAPHY N/A 02/02/2017   Procedure: LEFT HEART CATH AND CORS/GRAFTS ANGIOGRAPHY;  Surgeon: Belva Crome, MD;  Location: [facility];  Service: Cardiovascular;  Laterality: N/A;  . LEFT HEART CATHETERIZATION WITH CORONARY/GRAFT ANGIOGRAM N/A 07/09/2012   Procedure: LEFT HEART CATHETERIZATION WITH Beatrix Fetters;  Surgeon: Sinclair Grooms, MD;  Location: [facility] CATH LAB;  Service: Cardiovascular;  Laterality: N/A;  . TONSILLECTOMY AND ADENOIDECTOMY    . UPPER GASTROINTESTINAL ENDOSCOPY    . VISCERAL ANGIOGRAPHY N/A 04/15/2017   Procedure: VISCERAL ANGIOGRAPHY;  Surgeon: Waynetta Sandy, MD;  Location: [facility];  Service: Cardiovascular;  Laterality: N/A;     OB History   No obstetric history on file.      Home Medications    Prior to Admission medications   Medication Sig Start Date End Date Taking? Authorizing Provider  acetaminophen (TYLENOL) 650 MG CR tablet Take 1,300 mg by mouth 2 (two) times daily.    [provider]  Alum Hydroxide-Mag Carbonate (GAVISCON EXTRA STRENGTH) [phone] MG/10ML SUSP Take by mouth. Takes 4 times daily as needed    [provider]  aspirin EC 81 MG tablet Take 1  tablet (81 mg total) by mouth daily. 01/21/16   Minus Breeding, MD  Biotin 5000 MCG TABS Take 5,000 mcg by mouth daily.    [provider]  Calcium Carb-Cholecalciferol (CALCIUM 500 + D3) 500-600 MG-UNIT TABS Take 1 tablet by mouth daily.    [provider]  carvedilol (COREG) 3.125 MG tablet Take 1 tablet (3.125 mg total) by mouth 2 (two) times daily with a meal. 01/05/18   Minus Breeding, MD  clopidogrel (PLAVIX) 75 MG tablet Take last dose of Brilinta 04/01/17 in evening. Take 4 tablets of Clopidogrel on 04/02/17 then take 1 tablet by mouth daily thereafter. Patient taking differently: Take 75 mg by mouth daily.  04/01/17   Minus Breeding, MD  cycloSPORINE (RESTASIS) 0.05 % ophthalmic emulsion 1 drop 2 (two) times daily.    [provider]  docusate sodium (COLACE) 100 MG capsule Take 100 mg by mouth daily.    [provider]  hydrALAZINE (APRESOLINE) 10 MG tablet Take 10 mg by mouth 3 (three) times daily as needed.    [provider]  hydrALAZINE (APRESOLINE) 10 MG tablet Take 1 tablet (10 mg total) by mouth daily at 3 pm. 05/26/18   Almyra Deforest, PA  hydrALAZINE (APRESOLINE) 25 MG tablet Take 1 tablet (25 mg total) by mouth 2 (two) times daily. 8 am and 10 pm 05/26/18   Almyra Deforest, PA  Hyoscyamine Sulfate SL (LEVSIN/SL) 0.125 MG SUBL Take 1 tablet, dissolve under the tongue every 6 hours as needed for abdominal pain. 09/09/17   Esterwood, Amy S, PA-C  iron polysaccharides (NU-IRON) 150 MG capsule Take 150 mg by mouth 2 (two) times daily.     [provider]  isosorbide mononitrate (IMDUR) 60 MG 24 hr tablet Take 60 mg by mouth daily.    [provider]  levocetirizine (XYZAL) 5 MG tablet Take 1 tablet (5 mg total) by mouth every evening. Patient taking differently: Take 5 mg by mouth at bedtime.  05/13/17   Charlies Silvers, MD  LORazepam (ATIVAN) 0.5 MG tablet Take 0.25-0.5 mg by mouth See admin instructions. Take 0.25 mg in the morning  and 0.20m at bedtime.  May take an additional 0.5 mg twice daily as needed for anxiety 01/09/17   [provider]  Melatonin 5 MG TABS Take 5 mg by mouth at bedtime.    [provider]  memantine (NAMENDA) 5 MG tablet Take 5 mg by mouth daily.    [provider]  mirabegron ER (MYRBETRIQ) 50 MG TB24 tablet Take 50 mg by mouth daily.    [provider]  montelukast (SINGULAIR) 10 MG tablet Take 10 mg by mouth at bedtime.     [provider]  Multiple Vitamins-Minerals (CENTRUM SILVER 50+WOMEN) TABS Take 1 tablet by mouth daily.    [provider]  neomycin-polymyxin-hydrocortisone (CORTISPORIN) OTIC solution Apply 1-2 drops to both toes once daily until all used 04/20/18   Landis Martins, DPM  nitroGLYCERIN (NITROSTAT) 0.4 MG SL tablet Place 1 tablet (0.4 mg total) under the tongue every 5 (five) minutes as needed for chest pain. Reported on 11/12/2015 02/03/17   Lyda Jester M, PA-C  Peppermint Oil (IBGARD) 90 MG CPCR Take 2 capsules by mouth as directed. Per box instructions 02/23/18   Pyrtle, Lajuan Lines, MD  polyethylene glycol (MIRALAX / GLYCOLAX) packet Take 17 g by mouth 2 (two) times daily. 02/23/18   Pyrtle, Lajuan Lines, MD  PREMARIN vaginal cream Place 1 Applicatorful vaginally once a week.  05/05/17   [provider]  Probiotic Product (VSL#3 PO) Take 1 packet by mouth every morning.    [provider]  psyllium (HYDROCIL/METAMUCIL) 95 % PACK Take 1 packet by mouth daily.    [provider]  sertraline (ZOLOFT) 25 MG tablet Take 25-50 mg by mouth See admin instructions. Takes 69m po AM and 568mpo PM.     [provider]    Family History Family History  Problem Relation Age of Onset  . Heart attack Mother   . CAD Mother   . Obesity Sister   . Breast cancer Paternal Aunt   . Ulcers Father   . Asthma Neg Hx   . Eczema Neg Hx   . Urticaria Neg Hx   . Allergic rhinitis Neg Hx   . Angioedema Neg Hx   .  Atopy Neg Hx   . Immunodeficiency Neg Hx     Social History Social History   Tobacco Use  . Smoking status: Never Smoker  . Smokeless tobacco: Never Used  Substance Use Topics  . Alcohol use: Never    Frequency: Never  . Drug use: No     Allergies   Butazolidin [phenylbutazone]; Cephalosporins; Codeine; Levaquin [levofloxacin]; Macrodantin; Morphine and related; Trimethoprim; Buprenorphine; Cilostazol; Dexilant [dexlansoprazole]; Diclofenac; Erythromycin; Lactose intolerance (gi); Morphine; Nitrofurantoin; Simvastatin; Sulfa antibiotics; Vesicare [solifenacin]; Clindamycin/lincomycin; Doxycycline; Food; Ibuprofen; Lincomycin hcl; Penicillins; Toviaz [fesoterodine fumarate er]; and Voltaren [diclofenac sodium]   Review of Systems Review of Systems  Constitutional: Negative for chills and fever.  Respiratory: Negative for shortness of breath.   Cardiovascular: Negative for chest pain.  Musculoskeletal: Positive for arthralgias. Negative for neck pain.  Skin: Negative for rash and wound.  Allergic/Immunologic: Negative for immunocompromised state.  Neurological: Negative for weakness and numbness.  Hematological: Does not bruise/bleed easily.  All other systems reviewed and are negative.    Physical Exam Updated Vital Signs BP (!) 171/71   Pulse 77   Temp 98.8 F (37.1 C) (Oral)   Resp 16   SpO2 99%   Physical Exam Vitals signs and nursing note reviewed.  Constitutional:      General: She is not in acute distress.    Appearance: She is well-developed.  HENT:     Head: Normocephalic and atraumatic.  Eyes:     Conjunctiva/sclera: Conjunctivae normal.  Neck:     Musculoskeletal: Neck supple.  Cardiovascular:     Rate and Rhythm: Normal rate and regular rhythm.  Heart sounds: Normal heart sounds. No murmur. No friction rub.  Pulmonary:     Effort: Pulmonary effort is normal. No respiratory distress.     Breath sounds: Normal breath sounds. No wheezing or rales.   Abdominal:     General: There is no distension.     Palpations: Abdomen is soft.     Tenderness: There is no abdominal tenderness.  Musculoskeletal:     Comments: Moderate TTP lower lumbar spine, diffusely, with no bruising or deformity.  Skin:    General: Skin is warm.     Capillary Refill: Capillary refill takes less than 2 seconds.  Neurological:     Mental Status: She is alert and oriented to person, place, and time.     Motor: No abnormal muscle tone.    LOWER EXTREMITY EXAM: LEFT  INSPECTION & PALPATION: Left leg held in flexion and internal rotation. Exquisite pain with any pROM. TTP over greater trochanter.  SENSORY: sensation is intact to light touch in:  Superficial peroneal nerve distribution (over dorsum of foot) Deep peroneal nerve distribution (over first dorsal web space) Sural nerve distribution (over lateral aspect 5th metatarsal) Saphenous nerve distribution (over medial instep)  MOTOR:  + Motor EHL (great toe dorsiflexion) + FHL (great toe plantar flexion)  + TA (ankle dorsiflexion)  + GSC (ankle plantar flexion)  VASCULAR: 2+ dorsalis pedis and posterior tibialis pulses Capillary refill < 2 sec, toes warm and well-perfused  COMPARTMENTS: Soft, warm, well-perfused No pain with passive extension No parethesias    ED Treatments / Results  Labs (all labs ordered are listed, but only abnormal results are displayed) Labs Reviewed  CBC WITH DIFFERENTIAL/PLATELET - Abnormal; Notable for the following components:      Result Value   RBC 2.76 (*)    Hemoglobin 9.3 (*)    HCT 27.8 (*)    MCV 100.7 (*)    All other components within normal limits  BASIC METABOLIC PANEL - Abnormal; Notable for the following components:   Sodium 134 (*)    Potassium 3.1 (*)    All other components within normal limits  URINALYSIS, ROUTINE W REFLEX MICROSCOPIC    EKG None  Radiology Dg Hip Unilat With Pelvis 2-3 Views Left  Result Date: 06/30/2018 CLINICAL  DATA:  Left hip pain, unable to stand. EXAM: DG HIP (WITH OR WITHOUT PELVIS) 3V LEFT COMPARISON:  04/28/2018 FINDINGS: Pelvic ring is intact. Degenerative changes of lumbar spine are noted. No acute fracture or dislocation is seen no soft tissue abnormality is noted. IMPRESSION: No acute abnormality noted. Electronically Signed   By: Inez Catalina M.D.   On: 06/30/2018 13:40    Procedures Procedures (including critical care time)  Medications Ordered in ED Medications  fentaNYL (SUBLIMAZE) injection 12.5 mcg (has no administration in time range)  fentaNYL (SUBLIMAZE) injection 12.5 mcg (12.5 mcg Intravenous Given 06/30/18 1241)  fentaNYL (SUBLIMAZE) injection 12.5 mcg (12.5 mcg Intravenous Given 06/30/18 1433)     Initial Impression / Assessment and Plan / ED Course  I have reviewed the triage vital signs and the nursing notes.  Pertinent labs & imaging results that were available during my care of the patient were reviewed by me and considered in my medical decision making (see chart for details).     85 yo F here with severe L hip pain after hearing a "popping" sensation overnight, now unable to ambulate. Plain films negative but pt remains unable to ambulate or extend leg, so will check MR for lumbar  radiculopathy versus occult hip fx. Labs sent. Fentanyl given for pain.   Final Clinical Impressions(s) / ED Diagnoses   Final diagnoses:  Left hip pain    ED Discharge Orders    None       Duffy Bruce, MD 06/30/18 1536

## 2018-06-30 NOTE — ED Notes (Signed)
ED Provider at bedside. 

## 2018-06-30 NOTE — ED Notes (Signed)
Pt provided with sandwich and water per ED provider, [place].

## 2018-06-30 NOTE — ED Notes (Signed)
Pt back from X-ray.  

## 2018-06-30 NOTE — Progress Notes (Signed)
Patient arrived to unit a&ox4 with family at bedside. Patient reports pain and muscle spasms to her left hip. IV was missing when arrived to unit. A midline has been placed by IV team. Patient is resting comfortably in bed.

## 2018-06-30 NOTE — ED Triage Notes (Addendum)
Pt from [facility], last night she was having left hip pain and heard a "pop." Staff gave her a muscle relaxer last night which did not help. Pt endorses constant pain and intermittent spasms. Pt denies any fall or trauma. Pt had a fall in November and all scans were negative, but she has had continued right leg pain. Pt reports pain is worse when she moves. Pt recently had UTI, already finished abx.

## 2018-06-30 NOTE — ED Notes (Signed)
Urine Culture sent to Main Lab with UA 

## 2018-07-01 DIAGNOSIS — F329 Major depressive disorder, single episode, unspecified: Secondary | ICD-10-CM

## 2018-07-01 DIAGNOSIS — E876 Hypokalemia: Secondary | ICD-10-CM

## 2018-07-01 DIAGNOSIS — T148XXA Other injury of unspecified body region, initial encounter: Secondary | ICD-10-CM

## 2018-07-01 DIAGNOSIS — I10 Essential (primary) hypertension: Secondary | ICD-10-CM | POA: Diagnosis not present

## 2018-07-01 DIAGNOSIS — S76011A Strain of muscle, fascia and tendon of right hip, initial encounter: Secondary | ICD-10-CM | POA: Diagnosis not present

## 2018-07-01 DIAGNOSIS — F411 Generalized anxiety disorder: Secondary | ICD-10-CM

## 2018-07-01 DIAGNOSIS — M25552 Pain in left hip: Secondary | ICD-10-CM | POA: Diagnosis not present

## 2018-07-01 LAB — CBC
HCT: 27.3 % — ABNORMAL LOW (ref 36.0–46.0)
Hemoglobin: 9.2 g/dL — ABNORMAL LOW (ref 12.0–15.0)
MCH: 34.2 pg — ABNORMAL HIGH (ref 26.0–34.0)
MCHC: 33.7 g/dL (ref 30.0–36.0)
MCV: 101.5 fL — ABNORMAL HIGH (ref 80.0–100.0)
Platelets: 274 10*3/uL (ref 150–400)
RBC: 2.69 MIL/uL — ABNORMAL LOW (ref 3.87–5.11)
RDW: 14.1 % (ref 11.5–15.5)
WBC: 7.5 10*3/uL (ref 4.0–10.5)
nRBC: 0 % (ref 0.0–0.2)

## 2018-07-01 LAB — BASIC METABOLIC PANEL
Anion gap: 9 (ref 5–15)
BUN: 12 mg/dL (ref 8–23)
CO2: 21 mmol/L — ABNORMAL LOW (ref 22–32)
Calcium: 8.5 mg/dL — ABNORMAL LOW (ref 8.9–10.3)
Chloride: 103 mmol/L (ref 98–111)
Creatinine, Ser: 0.62 mg/dL (ref 0.44–1.00)
GFR calc Af Amer: >60 mL/min (ref >60)
GFR calc non Af Amer: >60 mL/min (ref >60)
Glucose, Bld: 99 mg/dL (ref 70–99)
Potassium: 3.7 mmol/L (ref 3.5–5.1)
Sodium: 133 mmol/L — ABNORMAL LOW (ref 135–145)

## 2018-07-01 MED ORDER — GABAPENTIN 100 MG PO CAPS
100.0000 mg | ORAL_CAPSULE | Freq: Every day | ORAL | Status: DC
  Administered 2018-07-01 (×2): 100 mg via ORAL
  Filled 2018-07-01 (×2): qty 1

## 2018-07-01 MED ORDER — CALCIUM CARBONATE ANTACID 500 MG PO CHEW
400.0000 mg | CHEWABLE_TABLET | Freq: Once | ORAL | Status: AC
  Administered 2018-07-01: 400 mg via ORAL
  Filled 2018-07-01: qty 2

## 2018-07-01 MED ORDER — CETIRIZINE HCL 10 MG PO TABS
5.0000 mg | ORAL_TABLET | Freq: Every evening | ORAL | Status: DC
  Administered 2018-07-01 – 2018-07-02 (×2): 5 mg via ORAL
  Filled 2018-07-01 (×2): qty 1

## 2018-07-01 MED ORDER — TRAMADOL HCL 50 MG PO TABS
50.0000 mg | ORAL_TABLET | Freq: Four times a day (QID) | ORAL | Status: DC | PRN
  Administered 2018-07-01 – 2018-07-02 (×3): 50 mg via ORAL
  Filled 2018-07-01 (×3): qty 1

## 2018-07-01 MED ORDER — ALUM & MAG HYDROXIDE-SIMETH 200-200-20 MG/5ML PO SUSP: 15.0000 mL | Freq: Four times a day (QID) | ORAL | Status: DC | PRN

## 2018-07-01 MED ORDER — DOCUSATE SODIUM 100 MG PO CAPS
100.0000 mg | ORAL_CAPSULE | Freq: Every day | ORAL | Status: DC
  Administered 2018-07-01 – 2018-07-02 (×2): 100 mg via ORAL
  Filled 2018-07-01 (×2): qty 1

## 2018-07-01 MED ORDER — MEMANTINE HCL 10 MG PO TABS
10.0000 mg | ORAL_TABLET | Freq: Two times a day (BID) | ORAL | Status: DC
  Administered 2018-07-01 – 2018-07-02 (×3): 10 mg via ORAL
  Filled 2018-07-01 (×3): qty 1

## 2018-07-01 MED ORDER — POLYSACCHARIDE IRON COMPLEX 150 MG PO CAPS
150.0000 mg | ORAL_CAPSULE | Freq: Two times a day (BID) | ORAL | Status: DC
  Administered 2018-07-01 – 2018-07-02 (×3): 150 mg via ORAL
  Filled 2018-07-01 (×3): qty 1

## 2018-07-01 MED ORDER — CHOLESTYRAMINE 4 G PO PACK
2.0000 g | PACK | ORAL | Status: DC | PRN
  Filled 2018-07-01: qty 1

## 2018-07-01 MED ORDER — HYDRALAZINE HCL 25 MG PO TABS
25.0000 mg | ORAL_TABLET | Freq: Two times a day (BID) | ORAL | Status: DC
  Administered 2018-07-01 – 2018-07-02 (×2): 25 mg via ORAL
  Filled 2018-07-01 (×3): qty 1

## 2018-07-01 MED ORDER — HYDRALAZINE HCL 10 MG PO TABS
10.0000 mg | ORAL_TABLET | Freq: Every day | ORAL | Status: DC
  Administered 2018-07-02: 10 mg via ORAL
  Filled 2018-07-01: qty 1

## 2018-07-01 MED ORDER — ISOSORBIDE MONONITRATE ER 60 MG PO TB24
60.0000 mg | ORAL_TABLET | Freq: Every day | ORAL | Status: DC
  Administered 2018-07-01 – 2018-07-02 (×2): 60 mg via ORAL
  Filled 2018-07-01 (×2): qty 1

## 2018-07-01 MED ORDER — RAMELTEON 8 MG PO TABS
8.0000 mg | ORAL_TABLET | Freq: Once | ORAL | Status: AC
  Administered 2018-07-01: 8 mg via ORAL
  Filled 2018-07-01: qty 1

## 2018-07-01 NOTE — Progress Notes (Signed)
Occupational Therapy Evaluation Patient Details Name: [name] MRN: [id number] DOB: 08/15/1931 Today's Date: 07/01/2018    History of Present Illness 85 y.o. female with PMH: CAD s/p CABG, Multiple Myeloma, HTN, HLD, GAD/Depression, and IBS who presents to the ED with left hip pain.  Pt report fall 2 months ago. Xrays negative for hip fx. Had seen orthopedist 1/15 CT negative for fracture. MRI of the left hip without contrast showed a high-grade partial or full-thickness tear of the iliopsoas near the insertion at the lesser trochanter of the left femur.  Intramuscular edema was seen.  Changes suggestive of left ischial bursitis and/or posttraumatic hematoma    Clinical Impression   PTA. Pt PLOF mod I with the use of assistive devices. Pt lives in assisted living with an accessible 1 bed room apartment, however, not receiving 24 hr assistance/ care. Pt currently requires Set up to Min guard A/ additional time for ADLs with VCs for sequencing and hand placement. Pt will benefit from continued skilled therapy to address strength and function for safe transfers and ability to perform ADLs. Due to cognitive deficit of dementia and decreased function, DC recommendation to SNF setting to ensure safe transition to perform all ADLs in home setting with the proper tools, strength, and compensatory techniques needed. OT will follow acutely.     Follow Up Recommendations  SNF;Supervision/Assistance - 24 hour    Equipment Recommendations  None recommended by OT    Recommendations for Other Services       Precautions / Restrictions Restrictions Weight Bearing Restrictions: No      Mobility Bed Mobility Overal bed mobility: Needs Assistance Bed Mobility: Supine to Sit     Supine to sit: Min assist     General bed mobility comments: Pt required assist with moving LLE to position to EOB and scoot pad to position hips. Pt required Min A to power up to seated position due to decreased UB  strength.   Transfers Overall transfer level: Needs assistance Equipment used: Rolling walker (2 wheeled) Transfers: Sit to/from Stand Sit to Stand: Min assist         General transfer comment: Assist with transition for sit to stand and VCs for instructions to contract glute muscles to assist with standing for functional transfer.     Balance Overall balance assessment: Needs assistance Sitting-balance support: Bilateral upper extremity supported;Feet supported Sitting balance-Leahy Scale: Poor Sitting balance - Comments: While demonstrate LB dressing in sitting. Pt requires use of opposite arm for support to don socks.    Standing balance support: Bilateral upper extremity supported Standing balance-Leahy Scale: Poor                             ADL either performed or assessed with clinical judgement   ADL Overall ADL's : Needs assistance/impaired Eating/Feeding: Independent   Grooming: Wash/dry face;Oral care;Set up   Upper Body Bathing: Set up   Lower Body Bathing: Min guard   Upper Body Dressing : Set up   Lower Body Dressing: Min guard   Toilet Transfer: Minimal assistance;Moderate assistance;RW;Ambulation Toilet Transfer Details (indicate cue type and reason): Toilet transfer simulated from bed to recliner. VCs for hand placment and sequencing for safe transition from sit to stand/ stand to sit.  Toileting- Water quality scientist and Hygiene: Min guard       Functional mobility during ADLs: Minimal assistance;Moderate assistance General ADL Comments: Overall pt is able to perform some ADLs such  as LB dressing and bathing with Mod I due to increased time to perform task. Pt requires Min to Mod A for functional transfer due to increased pain and safety with VCs sequencing.      Vision         Perception     Praxis      Pertinent Vitals/Pain       Hand Dominance Right   Extremity/Trunk Assessment Upper Extremity Assessment Upper Extremity  Assessment: Generalized weakness   Lower Extremity Assessment Lower Extremity Assessment: Defer to PT evaluation       Communication Communication Communication: No difficulties   Cognition Arousal/Alertness: Awake/alert Behavior During Therapy: Restless Overall Cognitive Status: History of cognitive impairments - at baseline                                 General Comments: Daughter reports pt suffering from Dementia   General Comments       Exercises     Shoulder Instructions      Home Living Family/patient expects to be discharged to:: Assisted living                             Home Equipment: Walker - 4 wheels          Prior Functioning/Environment Level of Independence: Independent with assistive device(s)        Comments: Pt reports incontinence and requiring use of pads or depends.        OT Problem List: Increased edema;Decreased activity tolerance;Impaired balance (sitting and/or standing)      OT Treatment/Interventions: Self-care/ADL training;DME and/or AE instruction;Balance training;Patient/family education    OT Goals(Current goals can be found in the care plan section) Acute Rehab OT Goals OT Goal Formulation: With patient/family Time For Goal Achievement: 07/15/18 Potential to Achieve Goals: Good  OT Frequency: Min 2X/week   Barriers to D/C: Decreased caregiver support  Pt lives in assisted living but does not have the 24 hr support as needed.        Co-evaluation PT/OT/SLP Co-Evaluation/Treatment: Yes Reason for Co-Treatment: For patient/therapist safety;To address functional/ADL transfers   OT goals addressed during session: ADL's and self-care      AM-PAC OT "6 Clicks" Daily Activity     Outcome Measure Help from another person eating meals?: A Little Help from another person taking care of personal grooming?: A Little Help from another person toileting, which includes using toliet, bedpan, or urinal?:  A Little Help from another person bathing (including washing, rinsing, drying)?: A Little Help from another person to put on and taking off regular upper body clothing?: A Little Help from another person to put on and taking off regular lower body clothing?: A Little 6 Click Score: 18   End of Session Equipment Utilized During Treatment: Gait belt;Rolling walker Nurse Communication: Mobility status  Activity Tolerance: Patient tolerated treatment well Patient left: in chair;with call bell/phone within reach;with chair alarm set;with family/visitor present  OT Visit Diagnosis: Muscle weakness (generalized) (M62.81);Unsteadiness on feet (R26.81);Pain Pain - part of body: (Glutes)                Time: 3557-3220 OT Time Calculation (min): 50 min Charges:  OT General Charges $OT Visit: 1 Visit OT Evaluation $OT Eval Moderate Complexity: [place], MSOT, OTR/L  Supplemental Rehabilitation Services  [phone]  Marius Ditch 07/01/2018, 4:51 PM

## 2018-07-01 NOTE — Progress Notes (Addendum)
Dr Wynelle Cleveland was sent a text message per amion- the family called and was requesting melatonin and tums order to be add to PRN medications.

## 2018-07-01 NOTE — Plan of Care (Signed)
  Problem: Education: Goal: Knowledge of General Education information will improve Description Including pain rating scale, medication(s)/side effects and non-pharmacologic comfort measures Outcome: Progressing   Problem: Clinical Measurements: Goal: Will remain free from infection Outcome: Progressing   Problem: Activity: Goal: Risk for activity intolerance will decrease Outcome: Progressing   Problem: Skin Integrity: Goal: Risk for impaired skin integrity will decrease Outcome: Progressing   Problem: Safety: Goal: Ability to remain free from injury will improve Outcome: Progressing   Problem: Pain Managment: Goal: General experience of comfort will improve Outcome: Progressing

## 2018-07-01 NOTE — Consult Note (Signed)
Reason for Consult:Left iliopsoas tear Referring Physician: S Rizwan  [name] is an 85 y.o. female.  HPI: [name] was in her usual state of health until 2 nights ago when she was crawling across her bed in the ALF where she lives. She had horrible left thigh pain and heard a pop. She was having too much pain and was brought to the ED. X-rays were negative but MRI showed an iliopsoas tear and orthopedic surgery was consulted. To complicate matters she's been diagnosed with lumbar spinal stenosis which has also been causing some leg pain and weakness prior to this event. She has seen a [facility] orthopedist about this but had not had the MRI yet. She c/o mostly thigh/groin pain that's exacerbated by movement.  Past Medical History:  Diagnosis Date  . Anxiety   . Arthritis   . CAD (coronary artery disease)   . Chondrocostal junction syndrome   . Chronic fatigue   . Dementia ([employer])    without Behavioral Disturbance  . GERD (gastroesophageal reflux disease)   . Hx of CABG   . Hyperlipidemia    GOAL LDL <70  . Hypertension   . IBS (irritable bowel syndrome)   . Incontinent of urine   . Jaundice    yellow jaundice 3rd grade  . Major depressive disorder   . Mild chronic anemia   . Mixed incontinence   . Multiple myeloma ([facility])    not having achieved remission  . NSTEMI (non-ST elevated myocardial infarction) ([place]) 01/31/2017  . Osteopenia   . Ovarian cyst, bilateral    Rt complex  . Overactive bladder   . Panic disorder   . Pulmonary nodules   . Radiculopathy   . Raynaud's syndrome   . Restless legs syndrome   . Spinal stenosis of lumbar region   . Thyroid nodule   . Unsteadiness on feet     Past Surgical History:  Procedure Laterality Date  . BUNIONECTOMY WITH HAMMERTOE RECONSTRUCTION Right 08/04/2012   Procedure:  HAMMERTOE RECONSTRUCTION;  Surgeon: Colin Rhein, MD;  Location: [facility];  Service: Orthopedics;  Laterality: Right;  HAMMER TOE RECONSTRUCTION  PROBABLE FLEXOR DIGITORUM LONGUS TO PROXIMAL PHALANX TRANSFER LATERALIZED   . CAPSULOTOMY Right 08/04/2012   Procedure: CAPSULOTOMY;  Surgeon: Colin Rhein, MD;  Location: [facility];  Service: Orthopedics;  Laterality: Right;  RIGHT 2ND TOE METATARSOPHALANGEAL JOINT DORSAL CAPSULOTOMY   . CATARACT EXTRACTION, BILATERAL     both cataracts  . COLONOSCOPY    . CORONARY ANGIOPLASTY WITH STENT PLACEMENT  2014   2 stents  . CORONARY ARTERY BYPASS GRAFT  2007   LIMA to LAD, SVG to RCA  non-ST segment MI 08/2009 (Dr. Tamala Julian)  . CORONARY STENT INTERVENTION N/A 02/02/2017   Procedure: CORONARY STENT INTERVENTION;  Surgeon: Belva Crome, MD;  Location: [facility];  Service: Cardiovascular;  Laterality: N/A;  Prox CFX  . DILATION AND CURETTAGE OF UTERUS    . LEFT HEART CATH AND CORS/GRAFTS ANGIOGRAPHY N/A 02/02/2017   Procedure: LEFT HEART CATH AND CORS/GRAFTS ANGIOGRAPHY;  Surgeon: Belva Crome, MD;  Location: [facility];  Service: Cardiovascular;  Laterality: N/A;  . LEFT HEART CATHETERIZATION WITH CORONARY/GRAFT ANGIOGRAM N/A 07/09/2012   Procedure: LEFT HEART CATHETERIZATION WITH Beatrix Fetters;  Surgeon: Sinclair Grooms, MD;  Location: [facility];  Service: Cardiovascular;  Laterality: N/A;  . TONSILLECTOMY AND ADENOIDECTOMY    . UPPER GASTROINTESTINAL ENDOSCOPY    .  VISCERAL ANGIOGRAPHY N/A 04/15/2017   Procedure: VISCERAL ANGIOGRAPHY;  Surgeon: Waynetta Sandy, MD;  Location: [facility];  Service: Cardiovascular;  Laterality: N/A;    Family History  Problem Relation Age of Onset  . Heart attack Mother   . CAD Mother   . Obesity Sister   . Breast cancer Paternal Aunt   . Ulcers Father   . Asthma Neg Hx   . Eczema Neg Hx   . Urticaria Neg Hx   . Allergic rhinitis Neg Hx   . Angioedema Neg Hx   . Atopy Neg Hx   . Immunodeficiency Neg Hx     Social History:  reports that she has never smoked. She has never used smokeless  tobacco. She reports that she does not drink alcohol or use drugs.  Allergies:  Allergies  Allergen Reactions  . Butazolidin [Phenylbutazone] Swelling and Rash  . Cephalosporins Other (See Comments)    Site of swelling not recalled  . Codeine Nausea And Vomiting  . Levaquin [Levofloxacin] Shortness Of Breath  . Macrodantin Other (See Comments)    Double vision  . Morphine And Related Other (See Comments)    Hallucinations   . Trimethoprim Nausea Only  . Aleve [Naproxen] Other (See Comments)    Was told by a MD to "never take this"  . Buprenorphine Other (See Comments)    "Allergic," per MAR  . Cilostazol Other (See Comments)    Stomach upset  . Dexilant [Dexlansoprazole] Diarrhea  . Diclofenac Other (See Comments)    Documented on MAR  . Erythromycin Nausea And Vomiting    All "mycin" drugs  . Lactose Intolerance (Gi) Diarrhea  . Morphine Other (See Comments)    Reaction not recalled  . Simvastatin Other (See Comments)    Muscle aches   . Sulfa Antibiotics Other (See Comments)    "Allergic," per MAR  . Vesicare [Solifenacin] Other (See Comments)    Reaction not recalled by the patient- gets a lot of UTI(s)  . Clindamycin/Lincomycin Other (See Comments)    Upsets the stomach  . Doxycycline Diarrhea  . Food Other (See Comments)    No spicy foods or black pepper, raw onions - causes gerd  . Ibuprofen Hives and Rash    Was told by a MD to "never take this"  . Lincomycin Hcl Nausea Only and Other (See Comments)    Upsets the stomach  . Penicillins Rash    Has patient had a PCN reaction causing immediate rash, facial/tongue/throat swelling, SOB or lightheadedness with hypotension: Yes Has patient had a PCN reaction causing severe rash involving mucus membranes or skin necrosis: No Has patient had a PCN reaction that required hospitalization No Has patient had a PCN reaction occurring within the last 10 years: No If all of the above answers are "NO", then may proceed with  Cephalosporin use.   [name] [Fesoterodine Fumarate Er] Other (See Comments)    Reaction not recalled  . Voltaren [Diclofenac Sodium] Rash    Medications: I have reviewed the patient's current medications.  Results for orders placed or performed during the hospital encounter of 06/30/18 (from the past 48 hour(s))  CBC with Differential     Status: Abnormal   Collection Time: 06/30/18  2:06 PM  Result Value Ref Range   WBC 7.2 4.0 - 10.5 K/uL   RBC 2.76 (L) 3.87 - 5.11 MIL/uL   Hemoglobin 9.3 (L) 12.0 - 15.0 g/dL   HCT 27.8 (L) 36.0 - 46.0 %  MCV 100.7 (H) 80.0 - 100.0 fL   MCH 33.7 26.0 - 34.0 pg   MCHC 33.5 30.0 - 36.0 g/dL   RDW 13.9 11.5 - 15.5 %   Platelets 251 150 - 400 K/uL   nRBC 0.0 0.0 - 0.2 %   Neutrophils Relative % 79 %   Neutro Abs 5.7 1.7 - 7.7 K/uL   Lymphocytes Relative 12 %   Lymphs Abs 0.9 0.7 - 4.0 K/uL   Monocytes Relative 9 %   Monocytes Absolute 0.6 0.1 - 1.0 K/uL   Eosinophils Relative 0 %   Eosinophils Absolute 0.0 0.0 - 0.5 K/uL   Basophils Relative 0 %   Basophils Absolute 0.0 0.0 - 0.1 K/uL   Immature Granulocytes 0 %   Abs Immature Granulocytes 0.02 0.00 - 0.07 K/uL    Comment: Performed at [facility] [address], [place], [place] [address]  Basic metabolic panel     Status: Abnormal   Collection Time: 06/30/18  2:06 PM  Result Value Ref Range   Sodium 134 (L) 135 - 145 mmol/L   Potassium 3.1 (L) 3.5 - 5.1 mmol/L   Chloride 100 98 - 111 mmol/L   CO2 23 22 - 32 mmol/L   Glucose, Bld 99 70 - 99 mg/dL   BUN 15 8 - 23 mg/dL   Creatinine, Ser 0.70 0.44 - 1.00 mg/dL   Calcium 9.0 8.9 - 10.3 mg/dL   GFR calc non Af Amer >60 >60 mL/min   GFR calc Af Amer >60 >60 mL/min   Anion gap 11 5 - 15    Comment: Performed at [facility] [address], [place], [place] [address]  Urinalysis, Routine w reflex microscopic     Status: Abnormal   Collection Time: 06/30/18  3:50 PM  Result Value Ref Range   Color, Urine YELLOW YELLOW    APPearance HAZY (A) CLEAR   Specific Gravity, Urine 1.011 1.005 - 1.030   pH 9.0 (H) 5.0 - 8.0   Glucose, UA NEGATIVE NEGATIVE mg/dL   Hgb urine dipstick NEGATIVE NEGATIVE   Bilirubin Urine NEGATIVE NEGATIVE   Ketones, ur NEGATIVE NEGATIVE mg/dL   Protein, ur 30 (A) NEGATIVE mg/dL   Nitrite NEGATIVE NEGATIVE   Leukocytes, UA NEGATIVE NEGATIVE   RBC / HPF 0-5 0 - 5 RBC/hpf   WBC, UA 0-5 0 - 5 WBC/hpf   Bacteria, UA RARE (A) NONE SEEN   Squamous Epithelial / LPF 0-5 0 - 5    Comment: Performed at [facility], [place] [address], [place], [place] [address]  Basic metabolic panel     Status: Abnormal   Collection Time: 07/01/18  4:40 AM  Result Value Ref Range   Sodium 133 (L) 135 - 145 mmol/L   Potassium 3.7 3.5 - 5.1 mmol/L   Chloride 103 98 - 111 mmol/L   CO2 21 (L) 22 - 32 mmol/L   Glucose, Bld 99 70 - 99 mg/dL   BUN 12 8 - 23 mg/dL   Creatinine, Ser 0.62 0.44 - 1.00 mg/dL   Calcium 8.5 (L) 8.9 - 10.3 mg/dL   GFR calc non Af Amer >60 >60 mL/min   GFR calc Af Amer >60 >60 mL/min   Anion gap 9 5 - 15    Comment: Performed at [facility], [place] [address] [place], [place] [address]  CBC     Status: Abnormal   Collection Time: 07/01/18  4:40 AM  Result  Value Ref Range   WBC 7.5 4.0 - 10.5 K/uL   RBC 2.69 (L) 3.87 - 5.11 MIL/uL   Hemoglobin 9.2 (L) 12.0 - 15.0 g/dL   HCT 27.3 (L) 36.0 - 46.0 %   MCV 101.5 (H) 80.0 - 100.0 fL   MCH 34.2 (H) 26.0 - 34.0 pg   MCHC 33.7 30.0 - 36.0 g/dL   RDW 14.1 11.5 - 15.5 %   Platelets 274 150 - 400 K/uL   nRBC 0.0 0.0 - 0.2 %    Comment: Performed at [facility], [place] [address] [place], [place] [address]    Mr Lumbar Spine Wo Contrast  Result Date: 06/30/2018 CLINICAL DATA:  Left hip pain.  No injury EXAM: MRI LUMBAR SPINE WITHOUT CONTRAST TECHNIQUE: Multiplanar, multisequence MR imaging of the lumbar spine was performed. No intravenous contrast was administered. COMPARISON:  CT abdomen pelvis 07/23/2017.  MRI left hip  today FINDINGS: Segmentation:  Normal Alignment:  5 mm anterolisthesis L5-S1 Vertebrae:  Negative for lumbar fracture or mass. Conus medullaris and cauda equina: Conus extends to the L1-2 level. Conus and cauda equina appear normal. Paraspinal and other soft tissues: Significant enlargement of the left iliacus muscle. Diffuse edema is present. Left psoas muscle normal. Review of the prior MRI of the left hip reveals edema extends into the ileal psoas insertion where there appears to be a muscle tear. This is most likely hemorrhage in the muscle. Disc levels: L1-2: Disc degeneration and spurring with right foraminal narrowing L2-3: Disc degeneration and spurring with mild spinal stenosis and mild foraminal stenosis bilaterally L3-4: Disc degeneration and facet degeneration. Mild spinal stenosis and mild foraminal stenosis bilaterally due to spurring L4-5: Severe spinal stenosis with diffuse disc bulging and bilateral facet hypertrophy. L5-S1: 5 mm anterolisthesis with severe facet degeneration. Negative for stenosis. IMPRESSION: 1. Significant enlargement left iliacus muscle compatible with hemorrhage related to muscle tear in the left groin. 2. Multilevel lumbar degenerative change with severe spinal stenosis L4-5. 3. These results were called by telephone at the time of interpretation on 06/30/2018 at 5:09 pm to Dr. Stark Jock , who verbally acknowledged these results. Electronically Signed   By: Franchot Gallo M.D.   On: 06/30/2018 17:11   Mr Hip Left Wo Contrast  Result Date: 06/30/2018 CLINICAL DATA:  Left hip pain, query fracture. EXAM: MR OF THE LEFT HIP WITHOUT CONTRAST TECHNIQUE: Multiplanar, multisequence MR imaging was performed. No intravenous contrast was administered. COMPARISON:  06/30/2018 plain radiographs FINDINGS: Bones: No marrow signal abnormality indicative acute pelvic nor hip fracture to the extent included. The sacrum is incompletely included on this study. No pelvic diastasis is identified. The  sacroiliac joints and pubic symphysis are intact. Articular cartilage and labrum Articular cartilage:  Moderate cartilaginous thinning. Labrum:  No labral tear identified. Joint or bursal effusion Joint effusion:  Trace physiologic fluid within both hip joints. Bursae: Small amount of fluid adjacent to the left ischial tuberosity. Muscles and tendons Muscles and tendons: High-grade partial to full-thickness tear of the distal iliopsoas near its insertion on the lesser trochanter, series 4/26. Extensive intramuscular edema is noted consistent with a muscle strain. Additionally, strains of the abductor muscles are noted. Other findings Miscellaneous:   None IMPRESSION: 1. High-grade partial to full-thickness tear of the iliopsoas near its insertion on the lesser trochanter of the left femur. Intramuscular edema consistent with muscle strains of the left iliopsoas and adductor muscles. 2. Fluid adjacent to the left ischial tuberosity raise concern for left ischial bursitis  and/or posttraumatic hematoma. 3. No acute fracture of the left hip.  No pelvic diastasis. Electronically Signed   By: Ashley Royalty M.D.   On: 06/30/2018 16:44   Dg Hip Unilat With Pelvis 2-3 Views Left  Result Date: 06/30/2018 CLINICAL DATA:  Left hip pain, unable to stand. EXAM: DG HIP (WITH OR WITHOUT PELVIS) 3V LEFT COMPARISON:  04/28/2018 FINDINGS: Pelvic ring is intact. Degenerative changes of lumbar spine are noted. No acute fracture or dislocation is seen no soft tissue abnormality is noted. IMPRESSION: No acute abnormality noted. Electronically Signed   By: Inez Catalina M.D.   On: 06/30/2018 13:40    Review of Systems  Constitutional: Negative for weight loss.  HENT: Negative for ear discharge, ear pain, hearing loss and tinnitus.   Eyes: Negative for blurred vision, double vision, photophobia and pain.  Respiratory: Negative for cough, sputum production and shortness of breath.   Cardiovascular: Negative for chest pain.   Gastrointestinal: Negative for abdominal pain, nausea and vomiting.  Genitourinary: Negative for dysuria, flank pain, frequency and urgency.  Musculoskeletal: Positive for back pain, joint pain (Left groin/thigh) and myalgias. Negative for falls and neck pain.  Neurological: Positive for sensory change and weakness. Negative for dizziness, tingling, focal weakness, loss of consciousness and headaches.  Endo/Heme/Allergies: Does not bruise/bleed easily.  Psychiatric/Behavioral: Negative for depression, memory loss and substance abuse. The patient is not nervous/anxious.    Blood pressure (!) 151/74, pulse 73, temperature 98.4 F (36.9 C), temperature source Oral, resp. rate 14, SpO2 99 %. Physical Exam  Constitutional: She appears well-developed and well-nourished. No distress.  HENT:  Head: Normocephalic and atraumatic.  Eyes: Conjunctivae are normal. Right eye exhibits no discharge. Left eye exhibits no discharge. No scleral icterus.  Neck: Normal range of motion.  Cardiovascular: Normal rate and regular rhythm.  Respiratory: Effort normal. No respiratory distress.  Musculoskeletal:     Comments: LLE No traumatic wounds, ecchymosis, or rash  Mod medial thigh TTP, pain with AROM, esp hip adduction, limited PROM but painless  No knee or ankle effusion  Knee stable to varus/ valgus and anterior/posterior stress  Sens DPN, SPN, TN intact  Motor EHL, ext, flex, evers 4/5  DP 2+, PT 2+, No significant edema  Neurological: She is alert.  Skin: Skin is warm and dry. She is not diaphoretic.  Psychiatric: She has a normal mood and affect. Her behavior is normal.    Assessment/Plan: Left iliopsoas tear -- Will need PT/OT. No surgical indication. No need to limit activity. I did recommend ice followed by heat for the next 1-2 weeks. If the spasms she is having don't subside or get worse she may need scheduled muscle relaxers. She may f/u with Dr. Stann Mainland as an OP or may return to her [facility]  orthopedist. I started some oral pain medication at the family's request but that may be changed by attending service as they see fit. Multiple medical problems including CAD s/p CABG, Multiple Myeloma, HTN, HLD, GAD/Depression, and IBS -- per primary service    Lisette Abu, PA-C Orthopedic Surgery [phone] 07/01/2018, 1:23 PM

## 2018-07-01 NOTE — Progress Notes (Signed)
PT Cancellation Note  Patient Details Name: [name] MRN: [id number] DOB: [birth year]   Cancelled Treatment:    Reason Eval/Treat Not Completed: (P) Patient not medically ready Pt NPO for possible surgery today for torn iliopsoas. Physician has not yet rounded. PT will follow back for Evaluation after decision has been made.  Myrissa Chipley B. Migdalia Dk PT, DPT Acute Rehabilitation Services Pager [phone] Office [phone]  [place] 07/01/2018, 9:32 AM

## 2018-07-01 NOTE — Progress Notes (Signed)
Dr Posey Pronto was sent a text message regarding the plan of care.  The patient's family is present and asking questions regarding further care.  The patient remains NPO.

## 2018-07-01 NOTE — Evaluation (Signed)
Physical Therapy Evaluation Patient Details Name: [name] MRN: [id number] DOB: 1931-09-28 Today's Date: 07/01/2018   History of Present Illness  85 y.o. female with PMH: CAD s/p CABG, Multiple Myeloma, HTN, HLD, GAD/Depression, and IBS who presents to the ED with left hip pain.  Pt report fall 2 months ago. Xrays negative for hip fx. Had seen orthopedist 1/15 CT negative for fracture. MRI of the left hip without contrast showed a high-grade partial or full-thickness tear of the iliopsoas near the insertion at the lesser trochanter of the left femur.  Intramuscular edema was seen.  Changes suggestive of left ischial bursitis and/or posttraumatic hematoma   Clinical Impression  PTA pt was living in ALF, independent in mobility with Rollator, and independent in ADLs and most iADLs. Pt currently limited in L hip pain and generalized weakness. Pt requires min A for bed mobility, and transfers and required increasing assistance from minA to [place] with ambulation of 3 feet to recliner. PT recommending SNF level rehab at discharge. PT will follow acutely.      Follow Up Recommendations SNF    Equipment Recommendations  None recommended by PT    Recommendations for Other Services       Precautions / Restrictions Precautions Precautions: Fall Precaution Comments: hx of falls Restrictions Weight Bearing Restrictions: No      Mobility  Bed Mobility Overal bed mobility: Needs Assistance Bed Mobility: Supine to Sit     Supine to sit: Min assist     General bed mobility comments: Pt required assist with moving LLE to position to EOB and scoot pad to position hips. Pt required Min A to power up to seated position due to decreased UB strength.   Transfers Overall transfer level: Needs assistance Equipment used: Rolling walker (2 wheeled) Transfers: Sit to/from Stand Sit to Stand: Min assist         General transfer comment: Assist with transition for sit to stand and VCs for  instructions to contract glute muscles to assist with standing for functional transfer.   Ambulation/Gait Ambulation/Gait assistance: Min assist;Mod assist Gait Distance (Feet): 3 Feet Assistive device: Rolling walker (2 wheeled) Gait Pattern/deviations: Step-to pattern;Shuffle;Decreased step length - right;Decreased step length - left;Trunk flexed Gait velocity: slowed Gait velocity interpretation: <1.31 ft/sec, indicative of household ambulator General Gait Details: minA increasing to [place] for shuffling steps from bed to recliner, pt with increased bilateral knee flexion, and flexed proward posture, vc for standing more upright, and engaging glutes         Balance Overall balance assessment: Needs assistance Sitting-balance support: Bilateral upper extremity supported;Feet supported Sitting balance-Leahy Scale: Poor Sitting balance - Comments: While demonstrate LB dressing in sitting. Pt requires use of opposite arm for support to don socks.    Standing balance support: Bilateral upper extremity supported Standing balance-Leahy Scale: Poor                               Pertinent Vitals/Pain Pain Assessment: Faces Faces Pain Scale: Hurts even more Pain Location: L hip with movement Pain Descriptors / Indicators: Grimacing;Guarding Pain Intervention(s): Limited activity within patient's tolerance;Monitored during session;Repositioned;Premedicated before session    Home Living Family/patient expects to be discharged to:: Assisted living               Home Equipment: Walker - 4 wheels      Prior Function Level of Independence: Independent with assistive device(s)  Comments: Pt reports incontinence and requiring use of pads or depends.     Hand Dominance   Dominant Hand: Right    Extremity/Trunk Assessment   Upper Extremity Assessment Upper Extremity Assessment: Generalized weakness    Lower Extremity Assessment Lower Extremity  Assessment: Defer to PT evaluation       Communication   Communication: No difficulties  Cognition Arousal/Alertness: Awake/alert Behavior During Therapy: Restless Overall Cognitive Status: History of cognitive impairments - at baseline                                 General Comments: Daughter reports pt suffering from Dementia      General Comments General comments (skin integrity, edema, etc.): Pt daughter/POA in room, concerned about d/c disposition within her ALF        Assessment/Plan    PT Assessment Patient needs continued PT services  PT Problem List Decreased strength;Decreased range of motion;Decreased activity tolerance;Decreased balance;Decreased mobility;Decreased cognition;Decreased coordination;Pain       PT Treatment Interventions DME instruction;Gait training;Functional mobility training;Therapeutic activities;Therapeutic exercise;Balance training;Cognitive remediation;Patient/family education    PT Goals (Current goals can be found in the Care Plan section)  Acute Rehab PT Goals Patient Stated Goal: have less pain PT Goal Formulation: With patient/family Time For Goal Achievement: 07/15/18 Potential to Achieve Goals: Fair    Frequency Min 3X/week        Co-evaluation PT/OT/SLP Co-Evaluation/Treatment: Yes Reason for Co-Treatment: For patient/therapist safety PT goals addressed during session: Mobility/safety with mobility;Balance;Strengthening/ROM OT goals addressed during session: ADL's and self-care       AM-PAC PT "6 Clicks" Mobility  Outcome Measure Help needed turning from your back to your side while in a flat bed without using bedrails?: A Lot Help needed moving from lying on your back to sitting on the side of a flat bed without using bedrails?: A Lot Help needed moving to and from a bed to a chair (including a wheelchair)?: A Lot Help needed standing up from a chair using your arms (e.g., wheelchair or bedside chair)?: A  Little Help needed to walk in hospital room?: Total Help needed climbing 3-5 steps with a railing? : Total 6 Click Score: 11    End of Session Equipment Utilized During Treatment: Gait belt Activity Tolerance: Patient limited by pain Patient left: in chair;with call bell/phone within reach;with chair alarm set;with family/visitor present Nurse Communication: Mobility status PT Visit Diagnosis: Unsteadiness on feet (R26.81);Other abnormalities of gait and mobility (R26.89);Repeated falls (R29.6);Muscle weakness (generalized) (M62.81);History of falling (Z91.81);Difficulty in walking, not elsewhere classified (R26.2);Pain Pain - Right/Left: Left Pain - part of body: Hip    Time: 2217-9810 PT Time Calculation (min) (ACUTE ONLY): 51 min   Charges:   PT Evaluation $PT Eval Moderate Complexity: 1 Mod          Tema Alire B. Migdalia Dk PT, DPT Acute Rehabilitation Services Pager [phone] Office [phone]   [place] 07/01/2018, 6:25 PM

## 2018-07-01 NOTE — Progress Notes (Signed)
PROGRESS NOTE    [name]   QQP:[id number]  DOB: 03-28-1932  DOA: 06/30/2018 PCP: [place]   Brief Narrative:  [name] is a 85 y.o. female who lives at ALF and has a medical history significant for CAD s/p CABG, Multiple Myeloma, HTN, HLD, GAD/Depression, and IBS who presents to the ED with left hip pain.  She has had pain in her left leg since a fall last November. She was seen by orthopedics on 06/23/2018 who reviewed her lumbar and AP pelvis films and felt her symptoms were related to spinal stenosis and radiculopathy.  She was started on a Medrol dose pack and Neurontin.  An MRI of the lumbar spine was ordered which patient says she was to have completed on 07/02/2018.   In the morning of 1/22, she was in bed was having worsening pain in her left hip and while attempting to crawl across her bed to call her nurse she noticed a popping sensation in her left hip with severe pain and intermittent spasms.  She was unable to lift her left leg or bear weight.   Xray hip unrevealing MRI left hip> High-grade partial to full-thickness tear of the iliopsoas near its insertion on the lesser trochanter of the left femur. Intramuscular edema consistent with muscle strains of the left iliopsoas and adductor muscles. MRI L spine   > enlargement left iliacus muscle compatible with hemorrhage related to muscle tear in the left groin.  Subjective: Has pain in left leg and having spasms in left groin which are quite severe. Has not tried to stand yet.     Assessment & Plan:   Principal Problem:   Left hip pain  - secondary to above mentioned tear and hemorrhage - orthopedic surgery consulted - pain control- PT eval  Active Problems:   Hypertension - hydralazine on hold- Coreg and Imdur resumed- will resume Hydralazine today    Depression   Generalized anxiety disorder  Dementia (Mild) - cont Namenda, Zoloft and Ativan as she takes at home     Hypokalemia - resolved    Coronary artery disease - on ASA and Plavix- continue unless ortho recommends to hold  Time spent in minutes: 35 DVT prophylaxis: SCDs Code Status: Full code Family Communication: daughter and POA, [name] Disposition Plan: await PT eval Consultants:   ortho Procedures:   none Antimicrobials:  Anti-infectives (From admission, onward)   None       Objective: Vitals:   06/30/18 2006 06/30/18 2142 07/01/18 0349 07/01/18 1027  BP: (!) 161/115 (!) 146/75 (!) 151/74 (!) 151/74  Pulse: 79 76 73 73  Resp: 16  14   Temp: 98.5 F (36.9 C)  98.4 F (36.9 C)   TempSrc: Oral  Oral   SpO2: 99%  99%     Intake/Output Summary (Last 24 hours) at 07/01/2018 1331 Last data filed at 07/01/2018 1325 Gross per 24 hour  Intake 439.83 ml  Output 300 ml  Net 139.83 ml   There were no vitals filed for this visit.  Examination: General exam: Appears comfortable  HEENT: PERRLA, oral mucosa moist, no sclera icterus or thrush Respiratory system: Clear to auscultation. Respiratory effort normal. Cardiovascular system: S1 & S2 heard, RRR.   Gastrointestinal system: Abdomen soft,  Tender in left thigh, nondistended. Normal bowel sounds. Central nervous system: Alert and oriented. No focal neurological deficits. Extremities: No cyanosis, clubbing or edema Skin: No rashes or ulcers Psychiatry:  Mood & affect appropriate.  Data Reviewed: I have personally reviewed following labs and imaging studies  CBC: Recent Labs  Lab 06/30/18 1406 07/01/18 0440  WBC 7.2 7.5  NEUTROABS 5.7  --   HGB 9.3* 9.2*  HCT 27.8* 27.3*  MCV 100.7* 101.5*  PLT 251 354   Basic Metabolic Panel: Recent Labs  Lab 06/30/18 1406 07/01/18 0440  NA 134* 133*  K 3.1* 3.7  CL 100 103  CO2 23 21*  GLUCOSE 99 99  BUN 15 12  CREATININE 0.70 0.62  CALCIUM 9.0 8.5*   GFR: CrCl cannot be calculated (Unknown ideal weight.). Liver Function Tests: No results for input(s): AST, ALT,  ALKPHOS, BILITOT, PROT, ALBUMIN in the last 168 hours. No results for input(s): LIPASE, AMYLASE in the last 168 hours. No results for input(s): AMMONIA in the last 168 hours. Coagulation Profile: No results for input(s): INR, PROTIME in the last 168 hours. Cardiac Enzymes: No results for input(s): CKTOTAL, CKMB, CKMBINDEX, TROPONINI in the last 168 hours. BNP (last 3 results) No results for input(s): PROBNP in the last 8760 hours. HbA1C: No results for input(s): HGBA1C in the last 72 hours. CBG: No results for input(s): GLUCAP in the last 168 hours. Lipid Profile: No results for input(s): CHOL, HDL, LDLCALC, TRIG, CHOLHDL, LDLDIRECT in the last 72 hours. Thyroid Function Tests: No results for input(s): TSH, T4TOTAL, FREET4, T3FREE, THYROIDAB in the last 72 hours. Anemia Panel: No results for input(s): VITAMINB12, FOLATE, FERRITIN, TIBC, IRON, RETICCTPCT in the last 72 hours. Urine analysis:    Component Value Date/Time   COLORURINE YELLOW 06/30/2018 1550   APPEARANCEUR HAZY (A) 06/30/2018 1550   LABSPEC 1.011 06/30/2018 1550   PHURINE 9.0 (H) 06/30/2018 1550   GLUCOSEU NEGATIVE 06/30/2018 1550   HGBUR NEGATIVE 06/30/2018 1550   BILIRUBINUR NEGATIVE 06/30/2018 1550   KETONESUR NEGATIVE 06/30/2018 1550   PROTEINUR 30 (A) 06/30/2018 1550   UROBILINOGEN 0.2 06/15/2014 1450   NITRITE NEGATIVE 06/30/2018 1550   LEUKOCYTESUR NEGATIVE 06/30/2018 1550   Sepsis Labs: '@LABRCNTIP' (procalcitonin:4,lacticidven:4) )No results found for this or any previous visit (from the past 240 hour(s)).       Radiology Studies: Mr Lumbar Spine Wo Contrast  Result Date: 06/30/2018 CLINICAL DATA:  Left hip pain.  No injury EXAM: MRI LUMBAR SPINE WITHOUT CONTRAST TECHNIQUE: Multiplanar, multisequence MR imaging of the lumbar spine was performed. No intravenous contrast was administered. COMPARISON:  CT abdomen pelvis 07/23/2017.  MRI left hip today FINDINGS: Segmentation:  Normal Alignment:  5 mm  anterolisthesis L5-S1 Vertebrae:  Negative for lumbar fracture or mass. Conus medullaris and cauda equina: Conus extends to the L1-2 level. Conus and cauda equina appear normal. Paraspinal and other soft tissues: Significant enlargement of the left iliacus muscle. Diffuse edema is present. Left psoas muscle normal. Review of the prior MRI of the left hip reveals edema extends into the ileal psoas insertion where there appears to be a muscle tear. This is most likely hemorrhage in the muscle. Disc levels: L1-2: Disc degeneration and spurring with right foraminal narrowing L2-3: Disc degeneration and spurring with mild spinal stenosis and mild foraminal stenosis bilaterally L3-4: Disc degeneration and facet degeneration. Mild spinal stenosis and mild foraminal stenosis bilaterally due to spurring L4-5: Severe spinal stenosis with diffuse disc bulging and bilateral facet hypertrophy. L5-S1: 5 mm anterolisthesis with severe facet degeneration. Negative for stenosis. IMPRESSION: 1. Significant enlargement left iliacus muscle compatible with hemorrhage related to muscle tear in the left groin. 2. Multilevel lumbar degenerative change with severe spinal stenosis L4-5.  3. These results were called by telephone at the time of interpretation on 06/30/2018 at 5:09 pm to Dr. Stark Jock , who verbally acknowledged these results. Electronically Signed   By: Franchot Gallo M.D.   On: 06/30/2018 17:11   Mr Hip Left Wo Contrast  Result Date: 06/30/2018 CLINICAL DATA:  Left hip pain, query fracture. EXAM: MR OF THE LEFT HIP WITHOUT CONTRAST TECHNIQUE: Multiplanar, multisequence MR imaging was performed. No intravenous contrast was administered. COMPARISON:  06/30/2018 plain radiographs FINDINGS: Bones: No marrow signal abnormality indicative acute pelvic nor hip fracture to the extent included. The sacrum is incompletely included on this study. No pelvic diastasis is identified. The sacroiliac joints and pubic symphysis are intact.  Articular cartilage and labrum Articular cartilage:  Moderate cartilaginous thinning. Labrum:  No labral tear identified. Joint or bursal effusion Joint effusion:  Trace physiologic fluid within both hip joints. Bursae: Small amount of fluid adjacent to the left ischial tuberosity. Muscles and tendons Muscles and tendons: High-grade partial to full-thickness tear of the distal iliopsoas near its insertion on the lesser trochanter, series 4/26. Extensive intramuscular edema is noted consistent with a muscle strain. Additionally, strains of the abductor muscles are noted. Other findings Miscellaneous:   None IMPRESSION: 1. High-grade partial to full-thickness tear of the iliopsoas near its insertion on the lesser trochanter of the left femur. Intramuscular edema consistent with muscle strains of the left iliopsoas and adductor muscles. 2. Fluid adjacent to the left ischial tuberosity raise concern for left ischial bursitis and/or posttraumatic hematoma. 3. No acute fracture of the left hip.  No pelvic diastasis. Electronically Signed   By: Ashley Royalty M.D.   On: 06/30/2018 16:44   Dg Hip Unilat With Pelvis 2-3 Views Left  Result Date: 06/30/2018 CLINICAL DATA:  Left hip pain, unable to stand. EXAM: DG HIP (WITH OR WITHOUT PELVIS) 3V LEFT COMPARISON:  04/28/2018 FINDINGS: Pelvic ring is intact. Degenerative changes of lumbar spine are noted. No acute fracture or dislocation is seen no soft tissue abnormality is noted. IMPRESSION: No acute abnormality noted. Electronically Signed   By: Inez Catalina M.D.   On: 06/30/2018 13:40      Scheduled Meds: . aspirin EC  81 mg Oral Daily  . carvedilol  3.125 mg Oral BID WC  . clopidogrel  75 mg Oral Daily  . gabapentin  100 mg Oral QHS  . isosorbide mononitrate  60 mg Oral Daily  . LORazepam  0.5 mg Oral QHS   And  . LORazepam  0.25 mg Oral Daily  . memantine  10 mg Oral BID  . mirabegron ER  50 mg Oral Daily  . montelukast  10 mg Oral QHS  . sertraline  25 mg  Oral Daily   And  . sertraline  50 mg Oral QHS  . sodium chloride flush  10-40 mL Intracatheter Q12H   Continuous Infusions:   LOS: 0 days      Debbe Odea, MD [facility] Pager: [email or web address] Password [email or web address] 07/01/2018, 1:31 PM

## 2018-07-02 DIAGNOSIS — F329 Major depressive disorder, single episode, unspecified: Secondary | ICD-10-CM | POA: Diagnosis not present

## 2018-07-02 DIAGNOSIS — T148XXA Other injury of unspecified body region, initial encounter: Secondary | ICD-10-CM | POA: Diagnosis not present

## 2018-07-02 DIAGNOSIS — D509 Iron deficiency anemia, unspecified: Secondary | ICD-10-CM | POA: Diagnosis not present

## 2018-07-02 DIAGNOSIS — I10 Essential (primary) hypertension: Secondary | ICD-10-CM | POA: Diagnosis not present

## 2018-07-02 DIAGNOSIS — F0390 Unspecified dementia without behavioral disturbance: Secondary | ICD-10-CM

## 2018-07-02 DIAGNOSIS — I2510 Atherosclerotic heart disease of native coronary artery without angina pectoris: Secondary | ICD-10-CM | POA: Diagnosis not present

## 2018-07-02 DIAGNOSIS — R410 Disorientation, unspecified: Secondary | ICD-10-CM | POA: Diagnosis not present

## 2018-07-02 DIAGNOSIS — M6281 Muscle weakness (generalized): Secondary | ICD-10-CM | POA: Diagnosis not present

## 2018-07-02 DIAGNOSIS — R279 Unspecified lack of coordination: Secondary | ICD-10-CM | POA: Diagnosis not present

## 2018-07-02 DIAGNOSIS — R262 Difficulty in walking, not elsewhere classified: Secondary | ICD-10-CM | POA: Diagnosis not present

## 2018-07-02 DIAGNOSIS — S76912D Strain of unspecified muscles, fascia and tendons at thigh level, left thigh, subsequent encounter: Secondary | ICD-10-CM | POA: Diagnosis not present

## 2018-07-02 DIAGNOSIS — F411 Generalized anxiety disorder: Secondary | ICD-10-CM | POA: Diagnosis not present

## 2018-07-02 DIAGNOSIS — I959 Hypotension, unspecified: Secondary | ICD-10-CM | POA: Diagnosis not present

## 2018-07-02 DIAGNOSIS — R531 Weakness: Secondary | ICD-10-CM | POA: Diagnosis not present

## 2018-07-02 DIAGNOSIS — C9000 Multiple myeloma not having achieved remission: Secondary | ICD-10-CM | POA: Diagnosis not present

## 2018-07-02 DIAGNOSIS — G894 Chronic pain syndrome: Secondary | ICD-10-CM | POA: Diagnosis not present

## 2018-07-02 DIAGNOSIS — Z743 Need for continuous supervision: Secondary | ICD-10-CM | POA: Diagnosis not present

## 2018-07-02 DIAGNOSIS — M25552 Pain in left hip: Secondary | ICD-10-CM | POA: Diagnosis not present

## 2018-07-02 DIAGNOSIS — S76092D Other specified injury of muscle, fascia and tendon of left hip, subsequent encounter: Secondary | ICD-10-CM | POA: Diagnosis not present

## 2018-07-02 DIAGNOSIS — M48061 Spinal stenosis, lumbar region without neurogenic claudication: Secondary | ICD-10-CM | POA: Diagnosis not present

## 2018-07-02 DIAGNOSIS — E876 Hypokalemia: Secondary | ICD-10-CM | POA: Diagnosis not present

## 2018-07-02 MED ORDER — LORAZEPAM 0.5 MG PO TABS: 0.5000 mg | ORAL_TABLET | ORAL | 0 refills | Status: DC

## 2018-07-02 MED ORDER — BACLOFEN 5 MG PO TABS: 5.0000 mg | ORAL_TABLET | Freq: Three times a day (TID) | ORAL | 0 refills | Status: DC | PRN

## 2018-07-02 MED ORDER — CALCIUM CARBONATE ANTACID 500 MG PO CHEW: 1.0000 | CHEWABLE_TABLET | Freq: Four times a day (QID) | ORAL | Status: DC | PRN

## 2018-07-02 MED ORDER — TRAMADOL HCL 50 MG PO TABS: 50.0000 mg | ORAL_TABLET | Freq: Three times a day (TID) | ORAL | 0 refills | Status: DC | PRN

## 2018-07-02 NOTE — Progress Notes (Signed)
Physical Therapy Treatment Patient Details Name: [name] MRN: [id number] DOB: 1932-03-28 Today's Date: 07/02/2018    History of Present Illness 85 y.o. female with PMH: CAD s/p CABG, Multiple Myeloma, HTN, HLD, GAD/Depression, and IBS who presents to the ED with left hip pain.  Pt report fall 2 months ago. Xrays negative for hip fx. Had seen orthopedist 1/15 CT negative for fracture. MRI of the left hip without contrast showed a high-grade partial or full-thickness tear of the iliopsoas near the insertion at the lesser trochanter of the left femur.  Intramuscular edema was seen.  Changes suggestive of left ischial bursitis and/or posttraumatic hematoma     PT Comments    Pt sitting up in chair on entry, eager to participate in therapy. Pt is making progress towards her goals however is limited in mobility by increased L LE pain with movement. Pt requires min A for transfers and ambulation of 8 feet with RW. Pt discouraged with distance of ambulation. Once back in recliner, given exercises to work on maintaining LE strength. Pt scheduled to d/c to SNF this afternoon.     Follow Up Recommendations  SNF     Equipment Recommendations  None recommended by PT    Recommendations for Other Services       Precautions / Restrictions Precautions Precautions: Fall Precaution Comments: hx of falls Restrictions Weight Bearing Restrictions: No    Mobility  Bed Mobility               General bed mobility comments: OOB in recliner on entry  Transfers Overall transfer level: Needs assistance Equipment used: Rolling walker (2 wheeled) Transfers: Sit to/from Stand Sit to Stand: Min assist         General transfer comment: Assist with transition for sit to stand and VCs for instructions to contract glute muscles to assist with standing for functional transfer.   Ambulation/Gait Ambulation/Gait assistance: Min assist Gait Distance (Feet): 8 Feet Assistive device: Rolling  walker (2 wheeled) Gait Pattern/deviations: Step-to pattern;Shuffle;Decreased step length - right;Decreased step length - left;Trunk flexed Gait velocity: slowed Gait velocity interpretation: <1.31 ft/sec, indicative of household ambulator General Gait Details: minA for steadying with RW, vc for upright posture, utilizing UE for offweighting L LE to swing forward        Balance Overall balance assessment: Needs assistance Sitting-balance support: Feet supported Sitting balance-Leahy Scale: Fair     Standing balance support: Bilateral upper extremity supported Standing balance-Leahy Scale: Poor                              Cognition Arousal/Alertness: Awake/alert Behavior During Therapy: Restless Overall Cognitive Status: History of cognitive impairments - at baseline                                 General Comments: Daughter reports pt suffering from Dementia      Exercises General Exercises - Lower Extremity Gluteal Sets: AROM;Both;5 reps;Seated Long Arc Quad: AROM;Both;Seated;5 reps Heel Slides: AROM;Both;5 reps;Seated Hip ABduction/ADduction: AROM;Both;5 reps;Seated Toe Raises: Both;5 reps;AROM    General Comments General comments (skin integrity, edema, etc.): Pt daughter present       Pertinent Vitals/Pain Pain Assessment: Faces Faces Pain Scale: Hurts even more Pain Location: L hip with movement Pain Descriptors / Indicators: Grimacing;Guarding Pain Intervention(s): Limited activity within patient's tolerance;Monitored during session;Repositioned  PT Goals (current goals can now be found in the care plan section) Acute Rehab PT Goals Patient Stated Goal: have less pain PT Goal Formulation: With patient/family Time For Goal Achievement: 07/15/18 Potential to Achieve Goals: Fair Progress towards PT goals: Progressing toward goals    Frequency    Min 3X/week      PT Plan Current plan remains appropriate     Co-evaluation PT/OT/SLP Co-Evaluation/Treatment: Yes            AM-PAC PT "6 Clicks" Mobility   Outcome Measure  Help needed turning from your back to your side while in a flat bed without using bedrails?: A Lot Help needed moving from lying on your back to sitting on the side of a flat bed without using bedrails?: A Lot Help needed moving to and from a bed to a chair (including a wheelchair)?: A Lot Help needed standing up from a chair using your arms (e.g., wheelchair or bedside chair)?: A Little Help needed to walk in hospital room?: Total Help needed climbing 3-5 steps with a railing? : Total 6 Click Score: 11    End of Session Equipment Utilized During Treatment: Gait belt Activity Tolerance: Patient limited by pain Patient left: in chair;with call bell/phone within reach;with chair alarm set;with family/visitor present Nurse Communication: Mobility status PT Visit Diagnosis: Unsteadiness on feet (R26.81);Other abnormalities of gait and mobility (R26.89);Repeated falls (R29.6);Muscle weakness (generalized) (M62.81);History of falling (Z91.81);Difficulty in walking, not elsewhere classified (R26.2);Pain Pain - Right/Left: Left Pain - part of body: Hip     Time: 2767-0110 PT Time Calculation (min) (ACUTE ONLY): 32 min  Charges:  $Gait Training: 8-22 mins $Therapeutic Activity: 8-22 mins                     Edan Serratore B. Migdalia Dk PT, DPT Acute Rehabilitation Services Pager [phone] Office [phone]    [place] 07/02/2018, 4:49 PM

## 2018-07-02 NOTE — Clinical Social Work Placement (Signed)
   CLINICAL SOCIAL WORK PLACEMENT  NOTE  Date:  07/02/2018  Patient Details  Name: [name] MRN: [id number] Date of Birth: 02-Nov-1931  Clinical Social Work is seeking post-discharge placement for this patient at the [place] level of care (*CSW will initial, date and re-position this form in  chart as items are completed):  Yes   Patient/family provided with [place] Work Department's list of facilities offering this level of care within the geographic area requested by the patient (or if unable, by the patient's family).  Yes   Patient/family informed of their freedom to choose among providers that offer the needed level of care, that participate in Medicare, Medicaid or managed care program needed by the patient, have an available bed and are willing to accept the patient.  Yes   Patient/family informed of [place]'s ownership interest in [facility] and [facility], as well as of the fact that they are under no obligation to receive care at these facilities.  PASRR submitted to EDS on       PASRR number received on 07/02/18     Existing PASRR number confirmed on       FL2 transmitted to all facilities in geographic area requested by pt/family on       FL2 transmitted to all facilities within larger geographic area on       Patient informed that his/her managed care company has contracts with or will negotiate with certain facilities, including the following:        Yes   Patient/family informed of bed offers received.  Patient chooses bed at [facility]     Physician recommends and patient chooses bed at      Patient to be transferred to [facility] [place] on 07/02/18.  Patient to be transferred to facility by [facility]     Patient family notified on 07/02/18 of transfer.  Name of family member notified:  Carlynn Purl, [place]     PHYSICIAN       Additional Comment:     _______________________________________________ Vinie Sill, [place] 07/02/2018, 4:56 PM

## 2018-07-02 NOTE — Progress Notes (Signed)
Patient will DC to: [facility] [place] Date:07/02/2018 Family Notified:Susan, daughter Transport ZS:MOLM  RN, patient, and facility notified of DC. Discharge Summary sent to facility. RN given number for [phone] . DC packet on chart. Ambulance transport requested for patient.   Clinical Social Worker signing off. Thurmond Butts, [place] Social Worker [phone]

## 2018-07-02 NOTE — [place] FL2 (Signed)
[place] MEDICAID FL2 LEVEL OF CARE SCREENING TOOL     IDENTIFICATION  Patient Name: [name] Birthdate: 1932/04/18 Sex: female Admission Date (Current Location): 06/30/2018  [facility] and [place] Number:  Herbalist and Address:  [facility] [place], [place] [address], [place], [place] [address]      Provider Number: [id number]  Attending Physician Name and Address:  Debbe Odea, MD  Relative Name and Phone Number:  [name] [facility]) [phone]    Current Level of Care: Hospital Recommended Level of Care: [place] Prior Approval Number:    Date Approved/Denied:   PASRR Number: (under review)  Discharge Plan: SNF    Current Diagnoses: Patient Active Problem List   Diagnosis Date Noted  . Left hip pain 06/30/2018  . Left thyroid nodule 08/29/2017  . Chronic idiopathic constipation 08/18/2017  . Abdominal pain 07/31/2017  . Mesenteric ischemia ([place]) 05/04/2017  . Unsteadiness on feet   . Unstable angina pectoris ([place])   . Tinea pedis   . Spinal stenosis of lumbar region   . Somnolence   . Seasonal allergic rhinitis   . Restless legs syndrome   . Raynaud's syndrome   . Raynauds syndrome   . Radiculopathy   . Panic disorder   . Ovarian cyst, bilateral   . Osteopenia   . Myocardial infarction ([place])   . Muscle weakness (generalized)   . Mixed incontinence   . Mild chronic anemia   . Major depressive disorder   . Long term (current) use of aspirin   . Intermediate coronary syndrome ([place])   . Incontinent of urine   . IBS (irritable bowel syndrome)   . Hypercholesteremia   . Hx of CABG   . GERD (gastroesophageal reflux disease)   . Dementia ([place])   . Chest pain   . Atherosclerotic heart disease of native coronary artery without angina pectoris   . Arthritis   . Anxiety   . Allergic rhinitis   . Abnormal weight loss   . History of adenomatous polyp of colon 03/02/2017  . NSTEMI (non-ST elevated myocardial  infarction) ([place]) 02/02/2017  . SOB (shortness of breath) 09/02/2016  . Dizziness 09/02/2016  . Non-ST elevation (NSTEMI) myocardial infarction ([place]) 11/26/2015  . Polymyalgia rheumatica ([place]) 11/26/2015  . Hypokalemia 11/26/2015  . Mild persistent asthma 11/12/2015  . Coronary artery disease involving autologous artery coronary bypass graft with angina pectoris with documented spasm ([place]) 11/12/2015  . Multiple myeloma in remission ([place]) 11/12/2015  . Current use of beta blocker 11/12/2015  . Gastroesophageal reflux disease without esophagitis 11/12/2015  . Other allergic rhinitis 11/12/2015  . Weight loss, non-intentional 10/03/2015  . Asthma 09/24/2015  . Asthma in adult without complication 11/30/7626  . Seasonal allergic rhinitis due to pollen 09/24/2015  . Cervical radiculopathy 07/25/2015  . Chronic coronary artery disease 07/25/2015  . Coronary artery disease 07/25/2015  . Disturbance in sleep behavior 07/25/2015  . Elevated CK 07/25/2015  . Elevated creatine kinase level 07/25/2015  . Generalized osteoarthritis of hand 07/25/2015  . Other long term (current) drug therapy 07/25/2015  . Overactive bladder 07/25/2015  . Restless leg 07/25/2015  . Restless leg syndrome 07/25/2015  . Sleep disturbances 07/25/2015  . Idiopathic peripheral neuropathy 07/04/2015  . Lumbar radiculopathy, chronic 07/04/2015  . Mixed dementia ([place]) 07/04/2015  . Risk for falls 07/04/2015  . Sleep disturbance 07/04/2015  . Chest pain with moderate risk for cardiac etiology 06/13/2015  . Right sciatic nerve pain 10/19/2014  .  Abnormal thyroid blood test 08/25/2014  . Chronic fatigue 08/09/2014  . Multiple myeloma ([place]) 07/21/2013  . Depression 01/14/2013  . Generalized anxiety disorder 01/14/2013  . Gastroesophageal reflux disease 01/13/2013  . Urine test positive for microalbuminuria 12/08/2011  . Cyst of ovary 12/17/2010  . KNEE PAIN, BILATERAL 08/15/2010  . Abnormal gait 08/15/2010  .  Atherosclerosis of coronary artery bypass graft 10/11/2009  . CHEST PAIN, NON-CARDIAC 10/11/2009  . HIP PAIN, RIGHT 08/13/2009  . GREATER TROCHANTERIC BURSITIS 08/13/2009  . LUMBOSACRAL SPONDYLOSIS WITHOUT MYELOPATHY 11/10/2008  . Osteoarthritis of lumbosacral spine without myelopathy 11/10/2008  . FOOT PAIN, BILATERAL 06/30/2008  . Hyperlipidemia 06/22/2008  . Hypertension 06/22/2008  . Raynaud's disease 06/22/2008  . BURSITIS, LEFT SHOULDER 06/22/2008    Orientation RESPIRATION BLADDER Height & Weight     Self, Time, Situation, Place  Normal Incontinent, External catheter Weight:   Height:     BEHAVIORAL SYMPTOMS/MOOD NEUROLOGICAL BOWEL NUTRITION STATUS      Continent Diet(see discharge summary)  AMBULATORY STATUS COMMUNICATION OF NEEDS Skin   Limited Assist Verbally Normal                       Personal Care Assistance Level of Assistance  Bathing, Feeding, Dressing, Total care Bathing Assistance: Limited assistance Feeding assistance: Independent Dressing Assistance: Limited assistance Total Care Assistance: Limited assistance   Functional Limitations Info  Sight, Speech, Hearing Sight Info: Adequate Hearing Info: Adequate Speech Info: Adequate    SPECIAL CARE FACTORS FREQUENCY  PT (By licensed PT), OT (By licensed OT)     PT Frequency: min 5x weekly OT Frequency: min 5x weekly            Contractures Contractures Info: Not present    Additional Factors Info  Code Status, Allergies Code Status Info: DNR Allergies Info: Butazolidin (phenylbutazone), Cephalosporins, Codeine, Levaquin (levofloxacin), Macrodantin, Morphine and related, trimethoprim, aleve (naproxen), Buprenorphine, Cilostazol, Dexilant (dexlansoprazole), diclofenac, erythromycin, lactose intolerance, morphine, simvastatin, sulfa antibiotics, vesicare (solifenacin), Clindamycin/lincomycin, Doxycyline, Food, Ibuprofen, Lincomycin, Penicillins, Toviaz (fesoterodine fumarate Er), Voltaren            Current Medications (07/02/2018):  This is the current hospital active medication list Current Facility-Administered Medications  Medication Dose Route Frequency Provider Last Rate Last Dose  . acetaminophen (TYLENOL) tablet 650 mg  650 mg Oral Q6H PRN Lenore Cordia, MD   650 mg at 07/01/18 0031   Or  . acetaminophen (TYLENOL) suppository 650 mg  650 mg Rectal Q6H PRN Lenore Cordia, MD      . alum & mag hydroxide-simeth (MAALOX/MYLANTA) 200-200-20 MG/5ML suspension 15 mL  15 mL Oral QID PRN Debbe Odea, MD      . aspirin EC tablet 81 mg  81 mg Oral Daily Lenore Cordia, MD   81 mg at 07/02/18 0902  . baclofen (LIORESAL) tablet 5 mg  5 mg Oral TID PRN Lenore Cordia, MD   5 mg at 07/01/18 2355  . calcium carbonate (TUMS - dosed in mg elemental calcium) chewable tablet 200 mg of elemental calcium  1 tablet Oral QID PRN Debbe Odea, MD      . carvedilol (COREG) tablet 3.125 mg  3.125 mg Oral BID WC Debbe Odea, MD   3.125 mg at 07/02/18 0855  . cetirizine (ZYRTEC) tablet 5 mg  5 mg Oral QPM Debbe Odea, MD   5 mg at 07/01/18 1708  . cholestyramine (QUESTRAN) packet 2 g  2 g Oral Q48H PRN Debbe Odea, MD      .  clopidogrel (PLAVIX) tablet 75 mg  75 mg Oral Daily Lenore Cordia, MD   75 mg at 07/02/18 0901  . docusate sodium (COLACE) capsule 100 mg  100 mg Oral Daily Debbe Odea, MD   100 mg at 07/02/18 0901  . gabapentin (NEURONTIN) capsule 100 mg  100 mg Oral QHS Zada Finders R, MD   100 mg at 07/01/18 2159  . hydrALAZINE (APRESOLINE) tablet 10 mg  10 mg Oral Q1500 Debbe Odea, MD      . hydrALAZINE (APRESOLINE) tablet 25 mg  25 mg Oral BID Debbe Odea, MD   25 mg at 07/02/18 0902  . iron polysaccharides (NIFEREX) capsule 150 mg  150 mg Oral BID Debbe Odea, MD   150 mg at 07/02/18 0901  . isosorbide mononitrate (IMDUR) 24 hr tablet 60 mg  60 mg Oral Daily Debbe Odea, MD   60 mg at 07/02/18 0902  . LORazepam (ATIVAN) tablet 0.5 mg  0.5 mg Oral QHS Skeet Simmer,  RPH   0.5 mg at 07/01/18 2158   And  . LORazepam (ATIVAN) tablet 0.25 mg  0.25 mg Oral Daily Skeet Simmer, RPH   0.25 mg at 07/02/18 2263  . LORazepam (ATIVAN) tablet 0.5 mg  0.5 mg Oral BID PRN Skeet Simmer, [facility]      . memantine (NAMENDA) tablet 10 mg  10 mg Oral BID Debbe Odea, MD   10 mg at 07/02/18 0901  . mirabegron ER (MYRBETRIQ) tablet 50 mg  50 mg Oral Daily Lenore Cordia, MD   50 mg at 07/02/18 0903  . montelukast (SINGULAIR) tablet 10 mg  10 mg Oral QHS Lenore Cordia, MD   10 mg at 07/01/18 2158  . sertraline (ZOLOFT) tablet 25 mg  25 mg Oral Daily Skeet Simmer, RPH   25 mg at 07/02/18 0901   And  . sertraline (ZOLOFT) tablet 50 mg  50 mg Oral QHS Skeet Simmer, RPH   50 mg at 07/01/18 2159  . sodium chloride flush (NS) 0.9 % injection 10-40 mL  10-40 mL Intracatheter Q12H Lenore Cordia, MD   10 mL at 07/01/18 2159  . sodium chloride flush (NS) 0.9 % injection 10-40 mL  10-40 mL Intracatheter PRN Lenore Cordia, MD   20 mL at 07/01/18 0443  . traMADol (ULTRAM) tablet 50 mg  50 mg Oral Q6H PRN Lisette Abu, PA-C   50 mg at 07/02/18 3354     Discharge Medications: Please see discharge summary for a list of discharge medications.  Relevant Imaging Results:  Relevant Lab Results:   Additional Information SSN: [id number]  Alberteen Sam, LCSW

## 2018-07-02 NOTE — Progress Notes (Signed)
PROGRESS NOTE    [name]   VWP:[id number]  DOB: Oct 27, 1931  DOA: 06/30/2018 PCP: [place]   Brief Narrative:  [name] is a 85 y.o. female who lives at ALF and has a medical history significant for CAD s/p CABG, Multiple Myeloma, HTN, HLD, GAD/Depression, and IBS who presents to the ED with left hip pain.  She has had pain in her left leg since a fall last November. She was seen by orthopedics on 06/23/2018 who reviewed her lumbar and AP pelvis films and felt her symptoms were related to spinal stenosis and radiculopathy.  She was started on a Medrol dose pack and Neurontin.  An MRI of the lumbar spine was ordered which patient says she was to have completed on 07/02/2018.   In the morning of 1/22, she was in bed was having worsening pain in her left hip and while attempting to crawl across her bed to call her nurse she noticed a popping sensation in her left hip with severe pain and intermittent spasms.  She was unable to lift her left leg or bear weight.   Xray hip unrevealing MRI left hip> High-grade partial to full-thickness tear of the iliopsoas near its insertion on the lesser trochanter of the left femur. Intramuscular edema consistent with muscle strains of the left iliopsoas and adductor muscles. MRI L spine   > enlargement left iliacus muscle compatible with hemorrhage related to muscle tear in the left groin.  Subjective:  she states she had a great deal of pain when she worked with PT last night. She has some spasms overnight.    Assessment & Plan:   Principal Problem:   Left hip pain  - secondary to above mentioned tear and hemorrhage - orthopedic surgery consulted - pain control- PT eval done- recommending SNF- SW working on this  Active Problems:   Hypertension - hydralazine, Coreg and Imdur with holding parameters    Depression   Generalized anxiety disorder  Dementia (Mild) - cont Namenda, Zoloft and Ativan as she takes at  home    Hypokalemia - resolved    Coronary artery disease - on ASA and Plavix-    Time spent in minutes: 35 DVT prophylaxis: SCDs Code Status: Full code Family Communication: daughter and POA, [name] Disposition Plan: await PT eval Consultants:   ortho Procedures:   none Antimicrobials:  Anti-infectives (From admission, onward)   None       Objective: Vitals:   07/02/18 0855 07/02/18 0902 07/02/18 1401 07/02/18 1528  BP: 100/62 100/62 (!) 117/51 (!) 114/51  Pulse: 78  79   Resp:   16   Temp:   (!) 97.3 F (36.3 C)   TempSrc:   Oral   SpO2:   99%     Intake/Output Summary (Last 24 hours) at 07/02/2018 1551 Last data filed at 07/02/2018 0500 Gross per 24 hour  Intake -  Output 200 ml  Net -200 ml   There were no vitals filed for this visit.  Examination: General exam: Appears comfortable - sleepy this AM HEENT: PERRLA, oral mucosa moist, no sclera icterus or thrush Respiratory system: Clear to auscultation. Respiratory effort normal. Cardiovascular system: S1 & S2 heard, RRR.   Gastrointestinal system: Abdomen soft,  Mild tenderness left groin, nondistended. Normal bowel sounds. Central nervous system: Alert and oriented. No focal neurological deficits. Extremities: No cyanosis, clubbing or edema Skin: No rashes or ulcers Psychiatry:  Mood & affect appropriate.    Data  Reviewed: I have personally reviewed following labs and imaging studies  CBC: Recent Labs  Lab 06/30/18 1406 07/01/18 0440  WBC 7.2 7.5  NEUTROABS 5.7  --   HGB 9.3* 9.2*  HCT 27.8* 27.3*  MCV 100.7* 101.5*  PLT 251 921   Basic Metabolic Panel: Recent Labs  Lab 06/30/18 1406 07/01/18 0440  NA 134* 133*  K 3.1* 3.7  CL 100 103  CO2 23 21*  GLUCOSE 99 99  BUN 15 12  CREATININE 0.70 0.62  CALCIUM 9.0 8.5*   GFR: CrCl cannot be calculated (Unknown ideal weight.). Liver Function Tests: No results for input(s): AST, ALT, ALKPHOS, BILITOT, PROT, ALBUMIN in the last 168  hours. No results for input(s): LIPASE, AMYLASE in the last 168 hours. No results for input(s): AMMONIA in the last 168 hours. Coagulation Profile: No results for input(s): INR, PROTIME in the last 168 hours. Cardiac Enzymes: No results for input(s): CKTOTAL, CKMB, CKMBINDEX, TROPONINI in the last 168 hours. BNP (last 3 results) No results for input(s): PROBNP in the last 8760 hours. HbA1C: No results for input(s): HGBA1C in the last 72 hours. CBG: No results for input(s): GLUCAP in the last 168 hours. Lipid Profile: No results for input(s): CHOL, HDL, LDLCALC, TRIG, CHOLHDL, LDLDIRECT in the last 72 hours. Thyroid Function Tests: No results for input(s): TSH, T4TOTAL, FREET4, T3FREE, THYROIDAB in the last 72 hours. Anemia Panel: No results for input(s): VITAMINB12, FOLATE, FERRITIN, TIBC, IRON, RETICCTPCT in the last 72 hours. Urine analysis:    Component Value Date/Time   COLORURINE YELLOW 06/30/2018 1550   APPEARANCEUR HAZY (A) 06/30/2018 1550   LABSPEC 1.011 06/30/2018 1550   PHURINE 9.0 (H) 06/30/2018 1550   GLUCOSEU NEGATIVE 06/30/2018 1550   HGBUR NEGATIVE 06/30/2018 1550   BILIRUBINUR NEGATIVE 06/30/2018 1550   KETONESUR NEGATIVE 06/30/2018 1550   PROTEINUR 30 (A) 06/30/2018 1550   UROBILINOGEN 0.2 06/15/2014 1450   NITRITE NEGATIVE 06/30/2018 1550   LEUKOCYTESUR NEGATIVE 06/30/2018 1550   Sepsis Labs: '@LABRCNTIP' (procalcitonin:4,lacticidven:4) )No results found for this or any previous visit (from the past 240 hour(s)).       Radiology Studies: Mr Lumbar Spine Wo Contrast  Result Date: 06/30/2018 CLINICAL DATA:  Left hip pain.  No injury EXAM: MRI LUMBAR SPINE WITHOUT CONTRAST TECHNIQUE: Multiplanar, multisequence MR imaging of the lumbar spine was performed. No intravenous contrast was administered. COMPARISON:  CT abdomen pelvis 07/23/2017.  MRI left hip today FINDINGS: Segmentation:  Normal Alignment:  5 mm anterolisthesis L5-S1 Vertebrae:  Negative for lumbar  fracture or mass. Conus medullaris and cauda equina: Conus extends to the L1-2 level. Conus and cauda equina appear normal. Paraspinal and other soft tissues: Significant enlargement of the left iliacus muscle. Diffuse edema is present. Left psoas muscle normal. Review of the prior MRI of the left hip reveals edema extends into the ileal psoas insertion where there appears to be a muscle tear. This is most likely hemorrhage in the muscle. Disc levels: L1-2: Disc degeneration and spurring with right foraminal narrowing L2-3: Disc degeneration and spurring with mild spinal stenosis and mild foraminal stenosis bilaterally L3-4: Disc degeneration and facet degeneration. Mild spinal stenosis and mild foraminal stenosis bilaterally due to spurring L4-5: Severe spinal stenosis with diffuse disc bulging and bilateral facet hypertrophy. L5-S1: 5 mm anterolisthesis with severe facet degeneration. Negative for stenosis. IMPRESSION: 1. Significant enlargement left iliacus muscle compatible with hemorrhage related to muscle tear in the left groin. 2. Multilevel lumbar degenerative change with severe spinal stenosis L4-5. 3.  These results were called by telephone at the time of interpretation on 06/30/2018 at 5:09 pm to Dr. Stark Jock , who verbally acknowledged these results. Electronically Signed   By: Franchot Gallo M.D.   On: 06/30/2018 17:11   Mr Hip Left Wo Contrast  Result Date: 06/30/2018 CLINICAL DATA:  Left hip pain, query fracture. EXAM: MR OF THE LEFT HIP WITHOUT CONTRAST TECHNIQUE: Multiplanar, multisequence MR imaging was performed. No intravenous contrast was administered. COMPARISON:  06/30/2018 plain radiographs FINDINGS: Bones: No marrow signal abnormality indicative acute pelvic nor hip fracture to the extent included. The sacrum is incompletely included on this study. No pelvic diastasis is identified. The sacroiliac joints and pubic symphysis are intact. Articular cartilage and labrum Articular cartilage:   Moderate cartilaginous thinning. Labrum:  No labral tear identified. Joint or bursal effusion Joint effusion:  Trace physiologic fluid within both hip joints. Bursae: Small amount of fluid adjacent to the left ischial tuberosity. Muscles and tendons Muscles and tendons: High-grade partial to full-thickness tear of the distal iliopsoas near its insertion on the lesser trochanter, series 4/26. Extensive intramuscular edema is noted consistent with a muscle strain. Additionally, strains of the abductor muscles are noted. Other findings Miscellaneous:   None IMPRESSION: 1. High-grade partial to full-thickness tear of the iliopsoas near its insertion on the lesser trochanter of the left femur. Intramuscular edema consistent with muscle strains of the left iliopsoas and adductor muscles. 2. Fluid adjacent to the left ischial tuberosity raise concern for left ischial bursitis and/or posttraumatic hematoma. 3. No acute fracture of the left hip.  No pelvic diastasis. Electronically Signed   By: Ashley Royalty M.D.   On: 06/30/2018 16:44      Scheduled Meds: . aspirin EC  81 mg Oral Daily  . carvedilol  3.125 mg Oral BID WC  . cetirizine  5 mg Oral QPM  . clopidogrel  75 mg Oral Daily  . docusate sodium  100 mg Oral Daily  . gabapentin  100 mg Oral QHS  . hydrALAZINE  10 mg Oral Q1500  . hydrALAZINE  25 mg Oral BID  . iron polysaccharides  150 mg Oral BID  . isosorbide mononitrate  60 mg Oral Daily  . LORazepam  0.5 mg Oral QHS   And  . LORazepam  0.25 mg Oral Daily  . memantine  10 mg Oral BID  . mirabegron ER  50 mg Oral Daily  . montelukast  10 mg Oral QHS  . sertraline  25 mg Oral Daily   And  . sertraline  50 mg Oral QHS  . sodium chloride flush  10-40 mL Intracatheter Q12H   Continuous Infusions:   LOS: 0 days      Debbe Odea, MD [facility] Pager: [email or web address] Password [facility] 07/02/2018, 3:51 PM

## 2018-07-02 NOTE — Clinical Social Work Note (Signed)
Clinical Social Work Assessment  Patient Details  Name: [name] MRN: [id number] Date of Birth: 12-16-1931  Date of referral:  07/02/18               Reason for consult:  Discharge Planning                Permission sought to share information with:  Case Manager, Facility Sport and exercise psychologist, Family Supports Permission granted to share information::  Yes, Verbal Permission Granted  Name::     [name]  Agency::  [facility]FS  Relationship::  daughter and POA  Contact Information:  [phone]  Housing/Transportation Living arrangements for the past 2 months:  [place] of Information:  Adult Children Patient Interpreter Needed:  None Criminal Activity/Legal Involvement Pertinent to Current Situation/Hospitalization:  No - Comment as needed Significant Relationships:  Adult Children Lives with:  Facility Resident Do you feel safe going back to the place where you live?  No Need for family participation in patient care:  Yes (Comment)  Care giving concerns:  CSW received referral for possible SNF placement at time of discharge. Spoke with patient regarding possibility of SNF placement . Patient's  family  is currently unable to care for her at their home given patient's current needs and fall risk.  Patient and [name] daughter POA at bedside expressed understanding of PT recommendation and are agreeable to SNF placement at time of discharge. CSW to continue to follow and assist with discharge planning needs.     Social Worker assessment / plan:  Spoke with patient and  Daughter [name] who is POA concerning possibility of rehab at [facility] before returning home.     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  They report preference for [facility] as patient is currently living there   . CSW explained insurance authorization process. Patient's family reported that they want patient to get stronger to be able to come back home.    Patient/Family's Understanding of and Emotional Response to Diagnosis, Current Treatment, and Prognosis:  Patient/family is realistic regarding therapy needs and expressed being hopeful for SNF placement. Patient expressed understanding of CSW role and discharge process as well as medical condition. No questions/concerns about plan or treatment.    Emotional Assessment Appearance:  Appears stated age Attitude/Demeanor/Rapport:  Gracious, Charismatic Affect (typically observed):  Accepting, Adaptable Orientation:  Oriented to Self, Oriented to Place, Oriented to  Time, Oriented to Situation Alcohol / Substance use:  Not Applicable Psych involvement (Current and /or in the community):  No (Comment)  Discharge Needs  Concerns to be addressed:  Discharge Planning Concerns Readmission within the last 30 days:  No Current discharge risk:  Dependent with Mobility Barriers to Discharge:  Continued Medical Work up   [employer], [place] 07/02/2018, 11:27 AM

## 2018-07-02 NOTE — Progress Notes (Signed)
Patient leaving via stretcher with PTAR.  Vitals stable upon discharge.

## 2018-07-02 NOTE — Progress Notes (Signed)
Report given to Roderic Ovens, RN at [facility].  Written discharge packet will be delivered via [facility]

## 2018-07-02 NOTE — Discharge Summary (Signed)
Physician Discharge Summary  [name] LGX:[id number] DOB: Mar 29, 1932 DOA: 06/30/2018  PCP: [facility], [place] date: 06/30/2018 Discharge date: 07/02/2018  Admitted From: ALF Disposition:  SNF   Recommendations for Outpatient Follow-up:  1. Check BP daily and if low (SBP < 120) , hold antihypertensives for the day   Discharge Condition:  stable   CODE STATUS:  DNR Diet recommendation:  Heart healhty Consultations:  ortho    Discharge Diagnoses:  Principal Problem:   Left hip pain Active Problems:   Hypertension   Depression   Generalized anxiety disorder   Hypokalemia   Coronary artery disease       Brief Summary: [name] is a 85 y.o.femalewho lives at [facility] and has a medical history significant forCAD s/p CABG, Multiple Myeloma, HTN, HLD, GAD/Depression,and IBS who presents to the ED with left hip pain. She has had pain in her left leg since a fall last November. She was seen by orthopedics on 06/23/2018 who reviewed her lumbar and AP pelvis films and felt her symptoms were related to spinal stenosis and radiculopathy. She wasstarted on a Medrol dose pack and Neurontin. An MRI of the lumbar spine was ordered which patient says she was to have completed on 07/02/2018.  In the morning of 1/22, she was in bed was having worsening pain in her left hip and while attempting to crawl across her bed to call her nurse she noticed a popping sensation in her left hip with severe pain and intermittent spasms. She was unable to lift her left leg or bear weight.   Xray hip unrevealing MRI left hip> High-grade partial to full-thickness tear of the iliopsoas near its insertion on the lesser trochanter of the left femur. Intramuscular edema consistent with muscle strains of the left iliopsoas and adductor muscles. MRI L spine   > enlargement left iliacus muscle compatible with hemorrhage related to muscle tear in the left groin.   Hospital  Course:  Principal Problem:   Left hip pain  - secondary to above mentioned tear and hemorrhage - orthopedic surgery consulted- WBAT, ICE or muscle relaxants as needed (Dr Corine Shelter) - pain control- PT eval done- recommending SNF-   Active Problems:   Hypertension - hydralazine, Coreg and Imdur with holding parameters    Depression   Generalized anxiety disorder  Dementia (Mild) - cont Namenda, Zoloft and Ativan as she takes at home    Hypokalemia - resolved    Coronary artery disease - on ASA and Plavix-     Discharge Exam: Vitals:   07/02/18 1401 07/02/18 1528  BP: (!) 117/51 (!) 114/51  Pulse: 79   Resp: 16   Temp: (!) 97.3 F (36.3 C)   SpO2: 99%    Vitals:   07/02/18 0855 07/02/18 0902 07/02/18 1401 07/02/18 1528  BP: 100/62 100/62 (!) 117/51 (!) 114/51  Pulse: 78  79   Resp:   16   Temp:   (!) 97.3 F (36.3 C)   TempSrc:   Oral   SpO2:   99%     General: Pt is alert, awake, not in acute distress Cardiovascular: RRR, S1/S2 +, no rubs, no gallops Respiratory: CTA bilaterally, no wheezing, no rhonchi Abdominal: Soft, NT, ND, bowel sounds + Extremities: no edema, no cyanosis- tender in left groin    Discharge Instructions  Discharge Instructions    Diet - low sodium heart healthy   Complete by:  As directed    Increase activity slowly  Complete by:  As directed      Allergies as of 07/02/2018      Reactions   Butazolidin [phenylbutazone] Swelling, Rash   Cephalosporins Other (See Comments)   Site of swelling not recalled   Codeine Nausea And Vomiting   Levaquin [levofloxacin] Shortness Of Breath   Macrodantin Other (See Comments)   Double vision   Morphine And Related Other (See Comments)   Hallucinations   Trimethoprim Nausea Only   Aleve [naproxen] Other (See Comments)   Was told by a MD to "never take this"   Buprenorphine Other (See Comments)   "Allergic," per [facility]   Cilostazol Other (See Comments)   Stomach upset   Dexilant  [dexlansoprazole] Diarrhea   Diclofenac Other (See Comments)   Documented on MAR   Erythromycin Nausea And Vomiting   All "mycin" drugs   Lactose Intolerance (gi) Diarrhea   Morphine Other (See Comments)   Reaction not recalled   Simvastatin Other (See Comments)   Muscle aches   Sulfa Antibiotics Other (See Comments)   "Allergic," per [facility]   Vesicare [solifenacin] Other (See Comments)   Reaction not recalled by the patient- gets a lot of UTI(s)   Clindamycin/lincomycin Other (See Comments)   Upsets the stomach   Doxycycline Diarrhea   Food Other (See Comments)   No spicy foods or black pepper, raw onions - causes gerd   Ibuprofen Hives, Rash   Was told by a MD to "never take this"   Lincomycin Hcl Nausea Only, Other (See Comments)   Upsets the stomach   Penicillins Rash   Has patient had a PCN reaction causing immediate rash, facial/tongue/throat swelling, SOB or lightheadedness with hypotension: Yes Has patient had a PCN reaction causing severe rash involving mucus membranes or skin necrosis: No Has patient had a PCN reaction that required hospitalization No Has patient had a PCN reaction occurring within the last 10 years: No If all of the above answers are "NO", then may proceed with Cephalosporin use.   Toviaz [fesoterodine Fumarate Er] Other (See Comments)   Reaction not recalled   Voltaren [diclofenac Sodium] Rash      Medication List    TAKE these medications   acetaminophen 650 MG CR tablet Commonly known as:  TYLENOL Take 1,300 mg by mouth 2 (two) times daily.   aspirin EC 81 MG tablet Take 1 tablet (81 mg total) by mouth daily.   Baclofen 5 MG Tabs Take 5 mg by mouth 3 (three) times daily as needed for muscle spasms.   Biotin 5000 MCG Subl Place 10,000 mcg under the tongue daily.   CALCIUM 600+D3 600-200 MG-UNIT Tabs Generic drug:  Calcium Carb-Cholecalciferol Take 1 tablet by mouth 2 (two) times daily.   carvedilol 3.125 MG tablet Commonly known as:   COREG Take 1 tablet (3.125 mg total) by mouth 2 (two) times daily with a meal.   CENTRUM SILVER 50+WOMEN Tabs Take 1 tablet by mouth daily.   cholestyramine 4 g packet Commonly known as:  QUESTRAN Take 2 g by mouth every other day as needed (diarrhea).   CITRUCEL oral powder Generic drug:  methylcellulose Take 0.5-1 packets by mouth 2 (two) times daily as needed (for constipation).   clopidogrel 75 MG tablet Commonly known as:  PLAVIX Take last dose of Brilinta 04/01/17 in evening. Take 4 tablets of Clopidogrel on 04/02/17 then take 1 tablet by mouth daily thereafter. What changed:    how much to take  how to take this  when to take this  additional instructions   conjugated estrogens vaginal cream Commonly known as:  PREMARIN Place 0.5 g vaginally See admin instructions. Insert 0.5 g vaginally 2 times a week as needed/as directed   cycloSPORINE 0.05 % ophthalmic emulsion Commonly known as:  RESTASIS Place 1 drop into both eyes 2 (two) times daily.   docusate sodium 100 MG capsule Commonly known as:  COLACE Take 100 mg by mouth See admin instructions. Take 100 mg by mouth once a day and an additional 100 mg as needed for constipation   gabapentin 100 MG capsule Commonly known as:  NEURONTIN Take 100 mg by mouth at bedtime.   GAVISCON EXTRA STRENGTH 508-475 MG/10ML Susp Generic drug:  Alum Hydroxide-Mag Carbonate Take 15 mLs by mouth 4 (four) times daily as needed (for reflux).   hydrALAZINE 25 MG tablet Commonly known as:  APRESOLINE Take 1 tablet (25 mg total) by mouth 2 (two) times daily. 8 am and 10 pm What changed:  additional instructions   hydrALAZINE 10 MG tablet Commonly known as:  APRESOLINE Take 1 tablet (10 mg total) by mouth daily at 3 pm. What changed:    when to take this  additional instructions   Hyoscyamine Sulfate SL 0.125 MG Subl Commonly known as:  LEVSIN/SL Take 1 tablet, dissolve under the tongue every 6 hours as needed for  abdominal pain. What changed:    how much to take  how to take this  when to take this  reasons to take this  additional instructions   isosorbide mononitrate 60 MG 24 hr tablet Commonly known as:  IMDUR Take 60 mg by mouth daily.   lactobacillus acidophilus Tabs tablet Take 1 tablet by mouth 2 (two) times daily.   levocetirizine 5 MG tablet Commonly known as:  XYZAL Take 1 tablet (5 mg total) by mouth every evening. What changed:  when to take this   LORazepam 0.5 MG tablet Commonly known as:  ATIVAN Take 1 tablet (0.5 mg total) by mouth See admin instructions. Take 0.5 mg by mouth at bedtime and 0.5 mg two times a day as needed for anxiety   Melatonin 5 MG Tabs Take 5 mg by mouth at bedtime.   memantine 10 MG tablet Commonly known as:  NAMENDA Take 10 mg by mouth 2 (two) times daily.   montelukast 10 MG tablet Commonly known as:  SINGULAIR Take 10 mg by mouth at bedtime.   MYRBETRIQ 50 MG Tb24 tablet Generic drug:  mirabegron ER Take 50 mg by mouth daily.   neomycin-polymyxin-hydrocortisone OTIC solution Commonly known as:  CORTISPORIN Apply 1-2 drops to both toes once daily until all used   nitroGLYCERIN 0.4 MG SL tablet Commonly known as:  NITROSTAT Place 1 tablet (0.4 mg total) under the tongue every 5 (five) minutes as needed for chest pain. Reported on 11/12/2015 What changed:    when to take this  additional instructions   NU-IRON 150 MG capsule Generic drug:  iron polysaccharides Take 150 mg by mouth 2 (two) times daily.   Peppermint Oil 90 MG Cpcr Commonly known as:  IBGARD Take 2 capsules by mouth as directed. Per box instructions What changed:    how much to take  when to take this  additional instructions   polyethylene glycol packet Commonly known as:  MIRALAX / GLYCOLAX Take 17 g by mouth 2 (two) times daily. What changed:    how much to take  when to take this  additional instructions  PRED FORTE 1 % ophthalmic  suspension Generic drug:  prednisoLONE acetate Place 1 drop into both eyes 2 (two) times daily as needed ("for bilateral conjunctivochalasis").   sertraline 50 MG tablet Commonly known as:  ZOLOFT Take 50 mg by mouth every evening.   sertraline 25 MG tablet Commonly known as:  ZOLOFT Take 25 mg by mouth every morning.   traMADol 50 MG tablet Commonly known as:  ULTRAM Take 1 tablet (50 mg total) by mouth every 8 (eight) hours as needed for moderate pain.       Allergies  Allergen Reactions  . Butazolidin [Phenylbutazone] Swelling and Rash  . Cephalosporins Other (See Comments)    Site of swelling not recalled  . Codeine Nausea And Vomiting  . Levaquin [Levofloxacin] Shortness Of Breath  . Macrodantin Other (See Comments)    Double vision  . Morphine And Related Other (See Comments)    Hallucinations   . Trimethoprim Nausea Only  . Aleve [Naproxen] Other (See Comments)    Was told by a MD to "never take this"  . Buprenorphine Other (See Comments)    "Allergic," per [facility]  . Cilostazol Other (See Comments)    Stomach upset  . Dexilant [Dexlansoprazole] Diarrhea  . Diclofenac Other (See Comments)    Documented on MAR  . Erythromycin Nausea And Vomiting    All "mycin" drugs  . Lactose Intolerance (Gi) Diarrhea  . Morphine Other (See Comments)    Reaction not recalled  . Simvastatin Other (See Comments)    Muscle aches   . Sulfa Antibiotics Other (See Comments)    "Allergic," per [facility]  . Vesicare [Solifenacin] Other (See Comments)    Reaction not recalled by the patient- gets a lot of UTI(s)  . Clindamycin/Lincomycin Other (See Comments)    Upsets the stomach  . Doxycycline Diarrhea  . Food Other (See Comments)    No spicy foods or black pepper, raw onions - causes gerd  . Ibuprofen Hives and Rash    Was told by a MD to "never take this"  . Lincomycin Hcl Nausea Only and Other (See Comments)    Upsets the stomach  . Penicillins Rash    Has patient had a PCN  reaction causing immediate rash, facial/tongue/throat swelling, SOB or lightheadedness with hypotension: Yes Has patient had a PCN reaction causing severe rash involving mucus membranes or skin necrosis: No Has patient had a PCN reaction that required hospitalization No Has patient had a PCN reaction occurring within the last 10 years: No If all of the above answers are "NO", then may proceed with Cephalosporin use.   Lisbeth Ply [Fesoterodine Fumarate Er] Other (See Comments)    Reaction not recalled  . Voltaren [Diclofenac Sodium] Rash     Procedures/Studies:   Mr Lumbar Spine Wo Contrast  Result Date: 06/30/2018 CLINICAL DATA:  Left hip pain.  No injury EXAM: MRI LUMBAR SPINE WITHOUT CONTRAST TECHNIQUE: Multiplanar, multisequence MR imaging of the lumbar spine was performed. No intravenous contrast was administered. COMPARISON:  CT abdomen pelvis 07/23/2017.  MRI left hip today FINDINGS: Segmentation:  Normal Alignment:  5 mm anterolisthesis L5-S1 Vertebrae:  Negative for lumbar fracture or mass. Conus medullaris and cauda equina: Conus extends to the L1-2 level. Conus and cauda equina appear normal. Paraspinal and other soft tissues: Significant enlargement of the left iliacus muscle. Diffuse edema is present. Left psoas muscle normal. Review of the prior MRI of the left hip reveals edema extends into the ileal psoas insertion where  there appears to be a muscle tear. This is most likely hemorrhage in the muscle. Disc levels: L1-2: Disc degeneration and spurring with right foraminal narrowing L2-3: Disc degeneration and spurring with mild spinal stenosis and mild foraminal stenosis bilaterally L3-4: Disc degeneration and facet degeneration. Mild spinal stenosis and mild foraminal stenosis bilaterally due to spurring L4-5: Severe spinal stenosis with diffuse disc bulging and bilateral facet hypertrophy. L5-S1: 5 mm anterolisthesis with severe facet degeneration. Negative for stenosis. IMPRESSION: 1.  Significant enlargement left iliacus muscle compatible with hemorrhage related to muscle tear in the left groin. 2. Multilevel lumbar degenerative change with severe spinal stenosis L4-5. 3. These results were called by telephone at the time of interpretation on 06/30/2018 at 5:09 pm to Dr. Stark Jock , who verbally acknowledged these results. Electronically Signed   By: Franchot Gallo M.D.   On: 06/30/2018 17:11   Mr Hip Left Wo Contrast  Result Date: 06/30/2018 CLINICAL DATA:  Left hip pain, query fracture. EXAM: MR OF THE LEFT HIP WITHOUT CONTRAST TECHNIQUE: Multiplanar, multisequence MR imaging was performed. No intravenous contrast was administered. COMPARISON:  06/30/2018 plain radiographs FINDINGS: Bones: No marrow signal abnormality indicative acute pelvic nor hip fracture to the extent included. The sacrum is incompletely included on this study. No pelvic diastasis is identified. The sacroiliac joints and pubic symphysis are intact. Articular cartilage and labrum Articular cartilage:  Moderate cartilaginous thinning. Labrum:  No labral tear identified. Joint or bursal effusion Joint effusion:  Trace physiologic fluid within both hip joints. Bursae: Small amount of fluid adjacent to the left ischial tuberosity. Muscles and tendons Muscles and tendons: High-grade partial to full-thickness tear of the distal iliopsoas near its insertion on the lesser trochanter, series 4/26. Extensive intramuscular edema is noted consistent with a muscle strain. Additionally, strains of the abductor muscles are noted. Other findings Miscellaneous:   None IMPRESSION: 1. High-grade partial to full-thickness tear of the iliopsoas near its insertion on the lesser trochanter of the left femur. Intramuscular edema consistent with muscle strains of the left iliopsoas and adductor muscles. 2. Fluid adjacent to the left ischial tuberosity raise concern for left ischial bursitis and/or posttraumatic hematoma. 3. No acute fracture of the  left hip.  No pelvic diastasis. Electronically Signed   By: Ashley Royalty M.D.   On: 06/30/2018 16:44   Dg Hip Unilat With Pelvis 2-3 Views Left  Result Date: 06/30/2018 CLINICAL DATA:  Left hip pain, unable to stand. EXAM: DG HIP (WITH OR WITHOUT PELVIS) 3V LEFT COMPARISON:  04/28/2018 FINDINGS: Pelvic ring is intact. Degenerative changes of lumbar spine are noted. No acute fracture or dislocation is seen no soft tissue abnormality is noted. IMPRESSION: No acute abnormality noted. Electronically Signed   By: Inez Catalina M.D.   On: 06/30/2018 13:40     The results of significant diagnostics from this hospitalization (including imaging, microbiology, ancillary and laboratory) are listed below for reference.     Microbiology: No results found for this or any previous visit (from the past 240 hour(s)).   Labs: BNP (last 3 results) No results for input(s): BNP in the last 8760 hours. Basic Metabolic Panel: Recent Labs  Lab 06/30/18 1406 07/01/18 0440  NA 134* 133*  K 3.1* 3.7  CL 100 103  CO2 23 21*  GLUCOSE 99 99  BUN 15 12  CREATININE 0.70 0.62  CALCIUM 9.0 8.5*   Liver Function Tests: No results for input(s): AST, ALT, ALKPHOS, BILITOT, PROT, ALBUMIN in the last 168 hours. No results  for input(s): LIPASE, AMYLASE in the last 168 hours. No results for input(s): AMMONIA in the last 168 hours. CBC: Recent Labs  Lab 06/30/18 1406 07/01/18 0440  WBC 7.2 7.5  NEUTROABS 5.7  --   HGB 9.3* 9.2*  HCT 27.8* 27.3*  MCV 100.7* 101.5*  PLT 251 274   Cardiac Enzymes: No results for input(s): CKTOTAL, CKMB, CKMBINDEX, TROPONINI in the last 168 hours. BNP: Invalid input(s): POCBNP CBG: No results for input(s): GLUCAP in the last 168 hours. D-Dimer No results for input(s): DDIMER in the last 72 hours. Hgb A1c No results for input(s): HGBA1C in the last 72 hours. Lipid Profile No results for input(s): CHOL, HDL, LDLCALC, TRIG, CHOLHDL, LDLDIRECT in the last 72 hours. Thyroid  function studies No results for input(s): TSH, T4TOTAL, T3FREE, THYROIDAB in the last 72 hours.  Invalid input(s): FREET3 Anemia work up No results for input(s): VITAMINB12, FOLATE, FERRITIN, TIBC, IRON, RETICCTPCT in the last 72 hours. Urinalysis    Component Value Date/Time   COLORURINE YELLOW 06/30/2018 1550   APPEARANCEUR HAZY (A) 06/30/2018 1550   LABSPEC 1.011 06/30/2018 1550   PHURINE 9.0 (H) 06/30/2018 1550   GLUCOSEU NEGATIVE 06/30/2018 1550   HGBUR NEGATIVE 06/30/2018 1550   BILIRUBINUR NEGATIVE 06/30/2018 1550   KETONESUR NEGATIVE 06/30/2018 1550   PROTEINUR 30 (A) 06/30/2018 1550   UROBILINOGEN 0.2 06/15/2014 1450   NITRITE NEGATIVE 06/30/2018 1550   LEUKOCYTESUR NEGATIVE 06/30/2018 1550   Sepsis Labs Invalid input(s): PROCALCITONIN,  WBC,  LACTICIDVEN Microbiology No results found for this or any previous visit (from the past 240 hour(s)).   Time coordinating discharge in minutes: 65  SIGNED:   Debbe Odea, MD  [facility] 07/02/2018, 4:36 PM Pager   If 7PM-7AM, please contact night-coverage [email or web address] Password [email or web address]

## 2018-07-06 DIAGNOSIS — S76092D Other specified injury of muscle, fascia and tendon of left hip, subsequent encounter: Secondary | ICD-10-CM | POA: Diagnosis not present

## 2018-07-06 DIAGNOSIS — I10 Essential (primary) hypertension: Secondary | ICD-10-CM | POA: Diagnosis not present

## 2018-07-06 DIAGNOSIS — I2510 Atherosclerotic heart disease of native coronary artery without angina pectoris: Secondary | ICD-10-CM | POA: Diagnosis not present

## 2018-07-06 DIAGNOSIS — D509 Iron deficiency anemia, unspecified: Secondary | ICD-10-CM | POA: Diagnosis not present

## 2018-07-20 DIAGNOSIS — F0390 Unspecified dementia without behavioral disturbance: Secondary | ICD-10-CM | POA: Diagnosis not present

## 2018-07-20 DIAGNOSIS — I2510 Atherosclerotic heart disease of native coronary artery without angina pectoris: Secondary | ICD-10-CM | POA: Diagnosis not present

## 2018-07-20 DIAGNOSIS — S76912D Strain of unspecified muscles, fascia and tendons at thigh level, left thigh, subsequent encounter: Secondary | ICD-10-CM | POA: Diagnosis not present

## 2018-07-20 DIAGNOSIS — G894 Chronic pain syndrome: Secondary | ICD-10-CM | POA: Diagnosis not present

## 2018-07-22 ENCOUNTER — Ambulatory Visit: Payer: Self-pay | Admitting: Oncology

## 2018-07-22 ENCOUNTER — Other Ambulatory Visit: Payer: Self-pay

## 2018-07-26 ENCOUNTER — Ambulatory Visit (INDEPENDENT_AMBULATORY_CARE_PROVIDER_SITE_OTHER): Payer: PPO | Admitting: Orthopedic Surgery

## 2018-07-26 DIAGNOSIS — M48061 Spinal stenosis, lumbar region without neurogenic claudication: Secondary | ICD-10-CM | POA: Diagnosis not present

## 2018-07-31 ENCOUNTER — Encounter (INDEPENDENT_AMBULATORY_CARE_PROVIDER_SITE_OTHER): Payer: Self-pay | Admitting: Orthopedic Surgery

## 2018-07-31 NOTE — Progress Notes (Signed)
Office Visit Note   Patient: [name]           Date of Birth: 03/14/1932           MRN: [id number] Visit Date: 07/26/2018 Requested by: [place], [place] [address] PCP: [facility]  Subjective: Chief Complaint  Patient presents with  . Left Hip - Follow-up  . Lower Back - Follow-up    HPI: Patient presents for evaluation of left hip pain.  Patient had injury to her left hip about 3 weeks ago.  Initially was getting better.  Had radiographs which were normal of the left hip.  MRI of the pelvis in the hospital at that time demonstrated some left iliac is swelling and hemorrhage.  She was only taking baby aspirin.  She is also had studies which shows severe stenosis at L4-5.  She is able to walk 300 yards by her report.  Her left hip pain is improving.  She did spend some time in rehab.  She does use a walker.  She denies any real limitation of walking endurance at this time              ROS: All systems reviewed are negative as they relate to the chief complaint within the history of present illness.  Patient denies  fevers or chills.   Assessment & Plan: Visit Diagnoses:  1. Spinal stenosis of lumbar region without neurogenic claudication     Plan: Impression is resolving left iliac [name] and hip flexor hemorrhage from iliopsoas tendinitis and rupture.  She does have a little bit of hip flexion weakness but she is improving.  Plan is to observe that problem.  Nothing really to do about it at this point and she is improving.  Potentially symptomatic is the L4-5 stenosis that she has.  I think this is something that could affect her in the long run.  Would like to check her back in 4 months on that just to see how she is doing from a stenosis standpoint.  Could consider injections in her back if needed.  Follow-Up Instructions: Return in about 4 weeks (around 08/23/2018).   Orders:  No orders of the defined types were  placed in this encounter.  No orders of the defined types were placed in this encounter.     Procedures: No procedures performed   Clinical Data: No additional findings.  Objective: Vital Signs: There were no vitals taken for this visit.  Physical Exam:   Constitutional: Patient appears well-developed HEENT:  Head: Normocephalic Eyes:EOM are normal Neck: Normal range of motion Cardiovascular: Normal rate Pulmonary/chest: Effort normal Neurologic: Patient is alert Skin: Skin is warm Psychiatric: Patient has normal mood and affect    Ortho Exam: Ortho exam demonstrates hip flexor weakness 4+ out of 5 on the left compared to the right.  Patient otherwise has symmetric and intact ankle dorsiflexion plantarflexion strength with palpable pedal pulses.  No groin pain with internal X rotation of the leg.  No paresthesias L1 S1 bilaterally.  No other masses lymphadenopathy bruising ecchymosis in that left leg region.  No real atrophy in the left leg versus right.  Specialty Comments:  No specialty comments available.  Imaging: No results found.   PMFS History: Patient Active Problem List   Diagnosis Date Noted  . Left hip pain 06/30/2018  . Left thyroid nodule 08/29/2017  . Chronic idiopathic constipation 08/18/2017  . Abdominal pain 07/31/2017  .  Mesenteric ischemia ([place]) 05/04/2017  . Unsteadiness on feet   . Unstable angina pectoris ([place])   . Tinea pedis   . Spinal stenosis of lumbar region   . Somnolence   . Seasonal allergic rhinitis   . Restless legs syndrome   . Raynaud's syndrome   . Raynauds syndrome   . Radiculopathy   . Panic disorder   . Ovarian cyst, bilateral   . Osteopenia   . Myocardial infarction ([place])   . Muscle weakness (generalized)   . Mixed incontinence   . Mild chronic anemia   . Major depressive disorder   . Long term (current) use of aspirin   . Intermediate coronary syndrome ([place])   . Incontinent of urine   . IBS (irritable bowel  syndrome)   . Hypercholesteremia   . Hx of CABG   . GERD (gastroesophageal reflux disease)   . Dementia ([place])   . Chest pain   . Atherosclerotic heart disease of native coronary artery without angina pectoris   . Arthritis   . Anxiety   . Allergic rhinitis   . Abnormal weight loss   . History of adenomatous polyp of colon 03/02/2017  . NSTEMI (non-ST elevated myocardial infarction) ([place]) 02/02/2017  . SOB (shortness of breath) 09/02/2016  . Dizziness 09/02/2016  . Non-ST elevation (NSTEMI) myocardial infarction ([place]) 11/26/2015  . Polymyalgia rheumatica ([place]) 11/26/2015  . Hypokalemia 11/26/2015  . Mild persistent asthma 11/12/2015  . Coronary artery disease involving autologous artery coronary bypass graft with angina pectoris with documented spasm ([place]) 11/12/2015  . Multiple myeloma in remission ([place]) 11/12/2015  . Current use of beta blocker 11/12/2015  . Gastroesophageal reflux disease without esophagitis 11/12/2015  . Other allergic rhinitis 11/12/2015  . Weight loss, non-intentional 10/03/2015  . Asthma 09/24/2015  . Asthma in adult without complication 67/34/1935  . Seasonal allergic rhinitis due to pollen 09/24/2015  . Cervical radiculopathy 07/25/2015  . Chronic coronary artery disease 07/25/2015  . Coronary artery disease 07/25/2015  . Disturbance in sleep behavior 07/25/2015  . Elevated CK 07/25/2015  . Elevated creatine kinase level 07/25/2015  . Generalized osteoarthritis of hand 07/25/2015  . Other long term (current) drug therapy 07/25/2015  . Overactive bladder 07/25/2015  . Restless leg 07/25/2015  . Restless leg syndrome 07/25/2015  . Sleep disturbances 07/25/2015  . Idiopathic peripheral neuropathy 07/04/2015  . Lumbar radiculopathy, chronic 07/04/2015  . 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 Risk for falls 07/04/2015  . Sleep disturbance 07/04/2015  . Chest pain with moderate risk for cardiac etiology 06/13/2015  . Right sciatic nerve pain 10/19/2014    . Abnormal thyroid blood test 08/25/2014  . Chronic fatigue 08/09/2014  . Multiple myeloma ([place]) 07/21/2013  . Depression 01/14/2013  . Generalized anxiety disorder 01/14/2013  . Gastroesophageal reflux disease 01/13/2013  . Urine test positive for microalbuminuria 12/08/2011  . Cyst of ovary 12/17/2010  . KNEE PAIN, BILATERAL 08/15/2010  . Abnormal gait 08/15/2010  . Atherosclerosis of coronary artery bypass graft 10/11/2009  . CHEST PAIN, NON-CARDIAC 10/11/2009  . HIP PAIN, RIGHT 08/13/2009  . GREATER TROCHANTERIC BURSITIS 08/13/2009  . LUMBOSACRAL SPONDYLOSIS WITHOUT MYELOPATHY 11/10/2008  . Osteoarthritis of lumbosacral spine without myelopathy 11/10/2008  . FOOT PAIN, BILATERAL 06/30/2008  . Hyperlipidemia 06/22/2008  . Hypertension 06/22/2008  . Raynaud's disease 06/22/2008  . BURSITIS, LEFT SHOULDER 06/22/2008   Past Medical History:  Diagnosis Date  . Anxiety   . Arthritis   . CAD (coronary artery disease)   . Chondrocostal junction  syndrome   . Chronic fatigue   . Dementia ([place])    without Behavioral Disturbance  . GERD (gastroesophageal reflux disease)   . Hx of CABG   . Hyperlipidemia    GOAL LDL <70  . Hypertension   . IBS (irritable bowel syndrome)   . Incontinent of urine   . Jaundice    yellow jaundice 3rd grade  . Major depressive disorder   . Mild chronic anemia   . Mixed incontinence   . Multiple myeloma ([facility])    not having achieved remission  . NSTEMI (non-ST elevated myocardial infarction) ([place]) 01/31/2017  . Osteopenia   . Ovarian cyst, bilateral    Rt complex  . Overactive bladder   . Panic disorder   . Pulmonary nodules   . Radiculopathy   . Raynaud's syndrome   . Restless legs syndrome   . Spinal stenosis of lumbar region   . Thyroid nodule   . Unsteadiness on feet     Family History  Problem Relation Age of Onset  . Heart attack Mother   . CAD Mother   . Obesity Sister   . Breast cancer Paternal Aunt   . Ulcers Father   .  Asthma Neg Hx   . Eczema Neg Hx   . Urticaria Neg Hx   . Allergic rhinitis Neg Hx   . Angioedema Neg Hx   . Atopy Neg Hx   . Immunodeficiency Neg Hx     Past Surgical History:  Procedure Laterality Date  . BUNIONECTOMY WITH HAMMERTOE RECONSTRUCTION Right 08/04/2012   Procedure:  HAMMERTOE RECONSTRUCTION;  Surgeon: Colin Rhein, MD;  Location: [facility];  Service: Orthopedics;  Laterality: Right;  HAMMER TOE RECONSTRUCTION PROBABLE FLEXOR DIGITORUM LONGUS TO PROXIMAL PHALANX TRANSFER LATERALIZED   . CAPSULOTOMY Right 08/04/2012   Procedure: CAPSULOTOMY;  Surgeon: Colin Rhein, MD;  Location: [facility];  Service: Orthopedics;  Laterality: Right;  RIGHT 2ND TOE METATARSOPHALANGEAL JOINT DORSAL CAPSULOTOMY   . CATARACT EXTRACTION, BILATERAL     both cataracts  . COLONOSCOPY    . CORONARY ANGIOPLASTY WITH STENT PLACEMENT  2014   2 stents  . CORONARY ARTERY BYPASS GRAFT  2007   LIMA to LAD, SVG to RCA  non-ST segment MI 08/2009 (Dr. Tamala Julian)  . CORONARY STENT INTERVENTION N/A 02/02/2017   Procedure: CORONARY STENT INTERVENTION;  Surgeon: Belva Crome, MD;  Location: [facility];  Service: Cardiovascular;  Laterality: N/A;  Prox CFX  . DILATION AND CURETTAGE OF UTERUS    . LEFT HEART CATH AND CORS/GRAFTS ANGIOGRAPHY N/A 02/02/2017   Procedure: LEFT HEART CATH AND CORS/GRAFTS ANGIOGRAPHY;  Surgeon: Belva Crome, MD;  Location: [facility];  Service: Cardiovascular;  Laterality: N/A;  . LEFT HEART CATHETERIZATION WITH CORONARY/GRAFT ANGIOGRAM N/A 07/09/2012   Procedure: LEFT HEART CATHETERIZATION WITH Beatrix Fetters;  Surgeon: Sinclair Grooms, MD;  Location[facility];  Service: Cardiovascular;  Laterality: N/A;  . TONSILLECTOMY AND ADENOIDECTOMY    . UPPER GASTROINTESTINAL ENDOSCOPY    . VISCERAL ANGIOGRAPHY N/A 04/15/2017   Procedure: VISCERAL ANGIOGRAPHY;  Surgeon: Waynetta Sandy, MD;  Location: [facility];   Service: Cardiovascular;  Laterality: N/A;   Social History   Occupational History  . Occupation: homemaker   . Occupation: Materials engineer education  Tobacco Use  . Smoking status: Never Smoker  . Smokeless tobacco: Never Used  Substance and Sexual Activity  . Alcohol use: Never  Frequency: Never  . Drug use: No  . Sexual activity: Never

## 2018-08-05 DIAGNOSIS — I10 Essential (primary) hypertension: Secondary | ICD-10-CM | POA: Diagnosis not present

## 2018-08-05 DIAGNOSIS — D649 Anemia, unspecified: Secondary | ICD-10-CM | POA: Diagnosis not present

## 2018-08-05 DIAGNOSIS — F329 Major depressive disorder, single episode, unspecified: Secondary | ICD-10-CM | POA: Diagnosis not present

## 2018-08-05 DIAGNOSIS — I2510 Atherosclerotic heart disease of native coronary artery without angina pectoris: Secondary | ICD-10-CM | POA: Diagnosis not present

## 2018-08-06 DIAGNOSIS — R300 Dysuria: Secondary | ICD-10-CM | POA: Diagnosis not present

## 2018-08-09 ENCOUNTER — Inpatient Hospital Stay: Payer: PPO | Attending: Oncology

## 2018-08-09 ENCOUNTER — Telehealth: Payer: Self-pay | Admitting: Oncology

## 2018-08-09 ENCOUNTER — Inpatient Hospital Stay (HOSPITAL_BASED_OUTPATIENT_CLINIC_OR_DEPARTMENT_OTHER): Payer: PPO | Admitting: Oncology

## 2018-08-09 VITALS — BP 120/55 | HR 66 | Temp 98.1°F | Resp 18 | Ht 63.0 in | Wt 97.6 lb

## 2018-08-09 DIAGNOSIS — D649 Anemia, unspecified: Secondary | ICD-10-CM

## 2018-08-09 DIAGNOSIS — C9000 Multiple myeloma not having achieved remission: Secondary | ICD-10-CM

## 2018-08-09 DIAGNOSIS — M8580 Other specified disorders of bone density and structure, unspecified site: Secondary | ICD-10-CM

## 2018-08-09 LAB — CMP (CANCER CENTER ONLY)
ALT: 14 U/L (ref 0–44)
AST: 31 U/L (ref 15–41)
Albumin: 3.6 g/dL (ref 3.5–5.0)
Alkaline Phosphatase: 61 U/L (ref 38–126)
Anion gap: 10 (ref 5–15)
BUN: 23 mg/dL (ref 8–23)
CO2: 29 mmol/L (ref 22–32)
Calcium: 9.1 mg/dL (ref 8.9–10.3)
Chloride: 95 mmol/L — ABNORMAL LOW (ref 98–111)
Creatinine: 0.9 mg/dL (ref 0.44–1.00)
GFR, Est AFR Am: >60 mL/min (ref >60)
GFR, Estimated: 59 mL/min — ABNORMAL LOW (ref >60)
Glucose, Bld: 100 mg/dL — ABNORMAL HIGH (ref 70–99)
POTASSIUM: 3.8 mmol/L (ref 3.5–5.1)
Sodium: 134 mmol/L — ABNORMAL LOW (ref 135–145)
TOTAL PROTEIN: 8.5 g/dL — AB (ref 6.5–8.1)
Total Bilirubin: 0.2 mg/dL — ABNORMAL LOW (ref 0.3–1.2)

## 2018-08-09 LAB — CBC WITH DIFFERENTIAL (CANCER CENTER ONLY)
Abs Immature Granulocytes: 0.01 10*3/uL (ref 0.00–0.07)
Basophils Absolute: 0 10*3/uL (ref 0.0–0.1)
Basophils Relative: 1 %
Eosinophils Absolute: 0.1 10*3/uL (ref 0.0–0.5)
Eosinophils Relative: 4 %
HCT: 26.6 % — ABNORMAL LOW (ref 36.0–46.0)
Hemoglobin: 8.6 g/dL — ABNORMAL LOW (ref 12.0–15.0)
Immature Granulocytes: 0 %
Lymphocytes Relative: 34 %
Lymphs Abs: 1.1 10*3/uL (ref 0.7–4.0)
MCH: 33.7 pg (ref 26.0–34.0)
MCHC: 32.3 g/dL (ref 30.0–36.0)
MCV: 104.3 fL — AB (ref 80.0–100.0)
MONO ABS: 0.5 10*3/uL (ref 0.1–1.0)
Monocytes Relative: 13 %
NEUTROS ABS: 1.6 10*3/uL — AB (ref 1.7–7.7)
Neutrophils Relative %: 48 %
PLATELETS: 209 10*3/uL (ref 150–400)
RBC: 2.55 MIL/uL — ABNORMAL LOW (ref 3.87–5.11)
RDW: 14.2 % (ref 11.5–15.5)
WBC Count: 3.4 10*3/uL — ABNORMAL LOW (ref 4.0–10.5)
nRBC: 0 % (ref 0.0–0.2)

## 2018-08-09 NOTE — Telephone Encounter (Signed)
Gave avs and calendar ° °

## 2018-08-09 NOTE — Progress Notes (Signed)
  [facility] OFFICE PROGRESS NOTE   Diagnosis: Multiple myeloma  INTERVAL HISTORY:   Ms. Penner returns as scheduled.  She feels well.  No recent infection.  Good appetite.  She injured her left hip while in bed and was admitted 06/30/2018.  A hemorrhage was noted in the left iliac [name] muscle.  A tear of the left iliopsoas was noted near its insertion at the lesser trochanter.  She reports the hip discomfort is improving.  She is completing physical therapy.  Objective:  Vital signs in last 24 hours:  Blood pressure (!) 120/55, pulse 66, temperature 98.1 F (36.7 C), temperature source Oral, resp. rate 18, height '5\' 3"'$  (1.6 m), weight 97 lb 9.6 oz (44.3 kg), SpO2 99 %.    HEENT: Neck without mass Lymphatics: No cervical or supraclavicular nodes Resp: Lungs clear bilaterally, decreased breath sounds at the right posterior chest, no respiratory distress Cardio: Regular rate and rhythm GI: No hepatosplenomegaly Vascular: Pitting edema at the left greater than right lower leg   Lab Results:  Lab Results  Component Value Date   WBC 3.4 (L) 08/09/2018   HGB 8.6 (L) 08/09/2018   HCT 26.6 (L) 08/09/2018   MCV 104.3 (H) 08/09/2018   PLT 209 08/09/2018   NEUTROABS 1.6 (L) 08/09/2018    CMP  Lab Results  Component Value Date   NA 134 (L) 08/09/2018   K 3.8 08/09/2018   CL 95 (L) 08/09/2018   CO2 29 08/09/2018   GLUCOSE 100 (H) 08/09/2018   BUN 23 08/09/2018   CREATININE 0.90 08/09/2018   CALCIUM 9.1 08/09/2018   PROT 8.5 (H) 08/09/2018   ALBUMIN 3.6 08/09/2018   AST 31 08/09/2018   ALT 14 08/09/2018   ALKPHOS 61 08/09/2018   BILITOT 0.2 (L) 08/09/2018   GFRNONAA 59 (L) 08/09/2018   GFRAA >60 08/09/2018     Medications: I have reviewed the patient's current medications.   Assessment/Plan: 1. Indolent multiple myeloma, asymptomatic. No clinical evidence of disease progression. A metastatic bone survey in January 2019 was negative for lytic lesions.   2. Mild anemia secondary to multiple myeloma 3. History of neutropenia-likely secondary to myeloma 4. History of coronary artery disease. 5. Osteopenia. 6. History of multiple urinary tract infections, followed by Dr. Diona Fanti. 7. "Raynaud" syndrome. 8. Chronic back pain. 9. History of hyponatremia. 10. Right foot surgery February 2014 11. Bilateral ovarian cysts-followed by GYN oncology 12. Elevated liver enzymes December 2018; liver enzymes normal on labs done 07/07/2017. 13. Left iliopsoas psoas tear with hemorrhage in the left iliacus muscle January 2020    Disposition: Ms. Esteve appears unchanged.  The hemoglobin is lower.  This may be related to the muscle hemorrhage noted on MRI in January.  The anemia may also be related to chronic disease or progression of multiple myeloma.  We will follow-up on the myeloma panel from today.  I discussed treatment with Revlimid/Decadron with Ms. Rochon.  She would like to hold on treatment for myeloma unless absolutely necessary.  We will request a repeat CBC at the assisted living facility in 3-4 weeks.  She will return for an office visit in 4 months.  I will see her sooner if the hemoglobin is lower.  Betsy Coder, MD  08/09/2018  3:58 PM

## 2018-08-10 LAB — PROTEIN ELECTROPHORESIS, SERUM
A/G Ratio: 0.9 (ref 0.7–1.7)
Albumin ELP: 3.7 g/dL (ref 2.9–4.4)
Alpha-1-Globulin: 0.2 g/dL (ref 0.0–0.4)
Alpha-2-Globulin: 0.6 g/dL (ref 0.4–1.0)
Beta Globulin: 3.1 g/dL — ABNORMAL HIGH (ref 0.7–1.3)
Gamma Globulin: 0.2 g/dL — ABNORMAL LOW (ref 0.4–1.8)
Globulin, Total: 4.1 g/dL — ABNORMAL HIGH (ref 2.2–3.9)
M-Spike, %: 2.3 g/dL — ABNORMAL HIGH
Total Protein ELP: 7.8 g/dL (ref 6.0–8.5)

## 2018-08-10 LAB — IGG: IgG (Immunoglobin G), Serum: 2751 mg/dL — ABNORMAL HIGH (ref 700–1600)

## 2018-08-10 LAB — KAPPA/LAMBDA LIGHT CHAINS
Kappa free light chain: 17.9 mg/L (ref 3.3–19.4)
Kappa, lambda light chain ratio: 0.03 — ABNORMAL LOW (ref 0.26–1.65)
Lambda free light chains: 633.6 mg/L — ABNORMAL HIGH (ref 5.7–26.3)

## 2018-08-13 ENCOUNTER — Telehealth: Payer: Self-pay | Admitting: *Deleted

## 2018-08-13 NOTE — Telephone Encounter (Signed)
TCT patient. No answer on home phone. No vm availability. TCT patient's daughter to advise that her mother's labs were WNL and pt to return as scheduled in July 2020

## 2018-08-13 NOTE — Telephone Encounter (Signed)
-----   Message from Ladell Pier, MD sent at 08/11/2018  7:54 PM EST ----- Please call patient, serum m-spike is stable, repeat cbc as scheduled, f/u as scheduled

## 2018-08-17 DIAGNOSIS — H04331 Acute lacrimal canaliculitis of right lacrimal passage: Secondary | ICD-10-CM | POA: Diagnosis not present

## 2018-08-19 DIAGNOSIS — M542 Cervicalgia: Secondary | ICD-10-CM | POA: Diagnosis not present

## 2018-08-19 DIAGNOSIS — M2550 Pain in unspecified joint: Secondary | ICD-10-CM | POA: Diagnosis not present

## 2018-08-19 DIAGNOSIS — Z681 Body mass index (BMI) 19 or less, adult: Secondary | ICD-10-CM | POA: Diagnosis not present

## 2018-08-19 DIAGNOSIS — M150 Primary generalized (osteo)arthritis: Secondary | ICD-10-CM | POA: Diagnosis not present

## 2018-08-19 DIAGNOSIS — M353 Polymyalgia rheumatica: Secondary | ICD-10-CM | POA: Diagnosis not present

## 2018-08-19 DIAGNOSIS — I7300 Raynaud's syndrome without gangrene: Secondary | ICD-10-CM | POA: Diagnosis not present

## 2018-08-24 ENCOUNTER — Ambulatory Visit: Payer: PPO | Admitting: Sports Medicine

## 2018-08-27 DIAGNOSIS — R600 Localized edema: Secondary | ICD-10-CM | POA: Diagnosis not present

## 2018-09-02 ENCOUNTER — Ambulatory Visit: Payer: PPO | Admitting: Cardiology

## 2018-09-06 ENCOUNTER — Telehealth: Payer: Self-pay | Admitting: *Deleted

## 2018-09-06 NOTE — Telephone Encounter (Signed)
I offered pt an appt tomorrow and will have a scheduler call to set up, and for her to perform 1/4 cup epsom salt in 1 quart warm water and cover with an antibiotic ointment bandaid until seen in office.

## 2018-09-06 NOTE — Telephone Encounter (Signed)
Pt states she is having pain at the outside of the toe, and may have an ingrown toenail which she has had before but she may have some urinary problem and she is going to see her doctor for that too.

## 2018-09-07 ENCOUNTER — Telehealth: Payer: Self-pay | Admitting: *Deleted

## 2018-09-07 DIAGNOSIS — D649 Anemia, unspecified: Secondary | ICD-10-CM | POA: Diagnosis not present

## 2018-09-07 NOTE — Telephone Encounter (Signed)
MD reviewed CBC results from 09/07/18 drawn at [facility]. Hgb is improved at 9.6. F/U in July as scheduled.

## 2018-09-07 NOTE — Telephone Encounter (Signed)
Attempted to call patient to schedule appt. but was unable to reach patient and there was not a voicemail set up to leave a message. Will try to call patient again.

## 2018-09-24 ENCOUNTER — Telehealth: Payer: Self-pay | Admitting: Internal Medicine

## 2018-09-24 ENCOUNTER — Other Ambulatory Visit: Payer: Self-pay

## 2018-09-24 MED ORDER — FAMOTIDINE 20 MG PO TABS: 20.0000 mg | ORAL_TABLET | Freq: Two times a day (BID) | ORAL | 3 refills | Status: AC

## 2018-09-24 NOTE — Telephone Encounter (Signed)
Script sent to pharmacy.

## 2018-09-24 NOTE — Telephone Encounter (Signed)
Spoke with pt and she is aware.

## 2018-09-24 NOTE — Telephone Encounter (Signed)
Pt states she is really having a hard time eating. She is in assisted living and due to covid they have a very limited menu with few choices. She takes IB gard in the am but forgets to take it in the pm. She was speaking with her son and he has similar symptoms as she does and he takes Nexium. Pt wonders if this might be an option for her. Please advise.

## 2018-09-24 NOTE — Telephone Encounter (Signed)
Medication is out of stock everywhere per patient. Uses [facility] if you can send like a prescription she would appreciate it.

## 2018-09-24 NOTE — Telephone Encounter (Signed)
In the past PPI has caused significant loose stools, thus I would recommend she try famotidine 20 mg twice daily.  Also I think she would benefit from trying to remember the 2 daily doses of IBgard

## 2018-09-28 ENCOUNTER — Other Ambulatory Visit: Payer: Self-pay | Admitting: Sports Medicine

## 2018-09-28 ENCOUNTER — Telehealth: Payer: Self-pay | Admitting: *Deleted

## 2018-09-28 DIAGNOSIS — L600 Ingrowing nail: Secondary | ICD-10-CM

## 2018-09-28 DIAGNOSIS — M79675 Pain in left toe(s): Secondary | ICD-10-CM

## 2018-09-28 DIAGNOSIS — M79674 Pain in right toe(s): Secondary | ICD-10-CM

## 2018-09-28 MED ORDER — NEOMYCIN-POLYMYXIN-HC 1 % OT SOLN: OTIC | 0 refills | Status: DC

## 2018-09-28 MED ORDER — LIDOCAINE HCL 4 % EX SOLN: CUTANEOUS | 0 refills | Status: DC | PRN

## 2018-09-28 NOTE — Telephone Encounter (Signed)
I agree with what you have already told her and I also have refilled Cortisporin for her to use to the toes and also sent Lidocaine solution to use as needed to painful area on her toes -Dr. Cannon Kettle

## 2018-09-28 NOTE — Telephone Encounter (Signed)
Pt called states she is having a problem with the ingrown toenails and Dr. Cannon Kettle knows about it, she is having pain and is doing epsom salt soaks every other day, and would like an antibiotic ointment she is using neosporin and the pharmacist states it is not strong enough. Pt asked that a nurse call back after she takes yoga.

## 2018-09-28 NOTE — Telephone Encounter (Signed)
I called pt and she states she has pain in shoes and when walking and would like the other antibiotic cream Dr. Cannon Kettle prescribed. I offered an appt and pt states due to the pandemic she does not want to come out, and the assisted living nurse practioner stated the toes are not infected. Pt states she also has arthritis in the toes, and the left toe is currently the most painful. I instructed pt to do the epsom salt daily and use the neosporin ointment if she did not wan to use the drops, and I would inform Dr. Cannon Kettle and call again.

## 2018-09-28 NOTE — Progress Notes (Signed)
I have refilled Cortisporin for her to use to the toes and also sent Lidocaine solution to use as needed to painful area on her toes -Dr. Cannon Kettle

## 2018-09-29 DIAGNOSIS — K219 Gastro-esophageal reflux disease without esophagitis: Secondary | ICD-10-CM | POA: Diagnosis not present

## 2018-10-04 ENCOUNTER — Telehealth: Payer: Self-pay | Admitting: *Deleted

## 2018-10-04 NOTE — Telephone Encounter (Signed)
Pt called states she has some questions concerning the painful toenails and has been soaking and using the drops prescribed.

## 2018-10-04 NOTE — Telephone Encounter (Signed)
I called pt and she states she only has two pairs of shoes she can wear comfortably and wears compression hose and wanted to know if she could use the drops after her shower. I told pt if she did not have any broken skin the antibiotic drops were not necessary but if she was having pain to use the lidocaine gel. I told pt after reviewing her chart it was apparent her nails had grown to the point of discomfort and if they were encurvated that would also make them uncomfortable, that she should purchase open toed compression socks and elevated the feet to decrease edema and pressure on the toes and nails in her shoes and to make an appt when she felt safe from exposure during this pandemic.

## 2018-10-07 DIAGNOSIS — M79674 Pain in right toe(s): Secondary | ICD-10-CM | POA: Diagnosis not present

## 2018-10-08 ENCOUNTER — Telehealth: Payer: Self-pay | Admitting: Internal Medicine

## 2018-10-08 NOTE — Telephone Encounter (Signed)
Patient called said that the pharmacy told her that the med (IBGARD) 90 MG has been discontinued. Would liket to know what she can take now.

## 2018-10-08 NOTE — Telephone Encounter (Signed)
Called patient to inform her to call a different pharmacy and check on medication. We are not aware of it being discontinued. Patient verbalized understanding.

## 2018-10-13 ENCOUNTER — Telehealth: Payer: Self-pay | Admitting: *Deleted

## 2018-10-13 NOTE — Telephone Encounter (Signed)
Pt states she is continuing to have pain in the toenail and they are growing everyday, she wears "TED" Hose which push down on the toes and nails. Pt states she is soaking every day and using the neosporin and wanted to know how often to use the lidocaine cream. I told pt to use the lidocaine cream as needed for the painfulness and to use the neosporin when there is broken skin. I told pt she should make an appt to be seen, we had spoken 3-4 times and her toe pain is getting no better. Pt states her family will not let her go out due to the [place] virus. I told pt to make an appt with the PA at her assisted living facility to see if she could write for open toed "TED" Hose and have her family purchase open-toe shoes to wear for comfort until she could be seen in office. I told pt I was concerned with her discomfort and to discuss with her family an appt.

## 2018-10-18 DIAGNOSIS — H01004 Unspecified blepharitis left upper eyelid: Secondary | ICD-10-CM | POA: Diagnosis not present

## 2018-10-18 DIAGNOSIS — H01001 Unspecified blepharitis right upper eyelid: Secondary | ICD-10-CM | POA: Diagnosis not present

## 2018-10-25 ENCOUNTER — Telehealth: Payer: Self-pay | Admitting: *Deleted

## 2018-10-26 NOTE — Telephone Encounter (Signed)
Pt states the drops run off her toes and she would like to have a refill of the cream, she is still doing the soaks, and is not ready to come out yet.

## 2018-10-28 DIAGNOSIS — G609 Hereditary and idiopathic neuropathy, unspecified: Secondary | ICD-10-CM | POA: Diagnosis not present

## 2018-10-28 NOTE — Telephone Encounter (Signed)
I told pt that Dr. Cannon Kettle wanted her seen by either her or a medical professional especially if she has new places. Pt states she will check to see if the care home will allow her to come in and if not she will get an appt to see nurse practitioner.

## 2018-10-28 NOTE — Telephone Encounter (Signed)
Pt called states she still has sores on her feet and wanted to know if the change in medication to the ointment would help.

## 2018-10-28 NOTE — Telephone Encounter (Signed)
Dr. Cannon Kettle states pt needs to be seen by a medical professional in the care facility or make an appt to see Dr. Cannon Kettle.

## 2018-11-08 ENCOUNTER — Telehealth: Payer: Self-pay | Admitting: *Deleted

## 2018-11-08 NOTE — Telephone Encounter (Signed)
Left message for [place], NP stating I spoken to pt since the beginning of the COVID-19 shut down concerning painful toenails and plantar foot, had offered an appt to get them evaluated and or trimmed, and pt refused the appt, but for her to have NP to evaluate and possibly get in or trim the toenails. I asked [place], NP to call again.

## 2018-11-08 NOTE — Telephone Encounter (Signed)
[name] states the NP - Jiles Garter wanted to talk to someone taking care of pt.

## 2018-11-10 ENCOUNTER — Telehealth: Payer: Self-pay | Admitting: Cardiology

## 2018-11-10 NOTE — Telephone Encounter (Signed)
CALL PT CELL [phone]

## 2018-11-10 NOTE — Telephone Encounter (Signed)
video visit via [employer], pre-reg complete, mychart pending, verbal consent given 11/10/2018 MS

## 2018-11-16 ENCOUNTER — Ambulatory Visit: Payer: PPO | Admitting: Sports Medicine

## 2018-11-16 ENCOUNTER — Encounter: Payer: Self-pay | Admitting: Sports Medicine

## 2018-11-16 ENCOUNTER — Other Ambulatory Visit: Payer: Self-pay

## 2018-11-16 VITALS — Temp 98.2°F

## 2018-11-16 DIAGNOSIS — M79674 Pain in right toe(s): Secondary | ICD-10-CM | POA: Diagnosis not present

## 2018-11-16 DIAGNOSIS — M79675 Pain in left toe(s): Secondary | ICD-10-CM | POA: Diagnosis not present

## 2018-11-16 DIAGNOSIS — L600 Ingrowing nail: Secondary | ICD-10-CM | POA: Diagnosis not present

## 2018-11-16 DIAGNOSIS — L603 Nail dystrophy: Secondary | ICD-10-CM

## 2018-11-16 NOTE — Progress Notes (Signed)
Subjective: [name] is a 85 y.o. female patient seen today in office with complaint of mildly painful thickened and elongated toenails especially at left 1st toe medial margin most painful than the right great toe medial margin; unable to trim. Patient states that her ingrown nails are starting to be painful again reports that she has been soaking and using Corticosporin solution that gives her some relief and has also been using topical lidocaine once a day when the toes are very sore.  Patient is from [facility] and reports that the staff there is giving her a difficult time with leaving for doctors office visits because of the COVID-19 virus.  No other issues at this time.  Patient Active Problem List   Diagnosis Date Noted  . Left hip pain 06/30/2018  . Disc degeneration, lumbar 06/23/2018  . Left thyroid nodule 08/29/2017  . Chronic idiopathic constipation 08/18/2017  . Abdominal pain 07/31/2017  . Mesenteric ischemia ([place]) 05/04/2017  . Unsteadiness on feet   . Unstable angina pectoris ([place])   . Tinea pedis   . Spinal stenosis of lumbar region   . Somnolence   . Seasonal allergic rhinitis   . Restless legs syndrome   . Raynaud's syndrome   . Raynauds syndrome   . Radiculopathy   . Panic disorder   . Ovarian cyst, bilateral   . Osteopenia   . Myocardial infarction ([place])   . Muscle weakness (generalized)   . Mixed incontinence   . Mild chronic anemia   . Major depressive disorder   . Long term (current) use of aspirin   . Intermediate coronary syndrome ([place])   . Incontinent of urine   . IBS (irritable bowel syndrome)   . Hypercholesteremia   . Hx of CABG   . GERD (gastroesophageal reflux disease)   . Dementia ([place])   . Chest pain   . Atherosclerotic heart disease of native coronary artery without angina pectoris   . Arthritis   . Anxiety   . Allergic rhinitis   . Abnormal weight loss   . History of adenomatous polyp of colon 03/02/2017  . NSTEMI (non-ST  elevated myocardial infarction) ([place]) 02/02/2017  . SOB (shortness of breath) 09/02/2016  . Dizziness 09/02/2016  . Non-ST elevation (NSTEMI) myocardial infarction ([place]) 11/26/2015  . Polymyalgia rheumatica ([place]) 11/26/2015  . Hypokalemia 11/26/2015  . Mild persistent asthma 11/12/2015  . Coronary artery disease involving autologous artery coronary bypass graft with angina pectoris with documented spasm ([place]) 11/12/2015  . Multiple myeloma in remission ([place]) 11/12/2015  . Current use of beta blocker 11/12/2015  . Gastroesophageal reflux disease without esophagitis 11/12/2015  . Other allergic rhinitis 11/12/2015  . Weight loss, non-intentional 10/03/2015  . Asthma 09/24/2015  . Asthma in adult without complication 42/68/3419  . Seasonal allergic rhinitis due to pollen 09/24/2015  . Cervical radiculopathy 07/25/2015  . Chronic coronary artery disease 07/25/2015  . Coronary artery disease 07/25/2015  . Disturbance in sleep behavior 07/25/2015  . Elevated CK 07/25/2015  . Elevated creatine kinase level 07/25/2015  . Generalized osteoarthritis of hand 07/25/2015  . Other long term (current) drug therapy 07/25/2015  . Overactive bladder 07/25/2015  . Restless leg 07/25/2015  . Restless leg syndrome 07/25/2015  . Sleep disturbances 07/25/2015  . Idiopathic peripheral neuropathy 07/04/2015  . Lumbar radiculopathy, chronic 07/04/2015  . Mixed dementia ([place]) 07/04/2015  . Risk for falls 07/04/2015  . Sleep disturbance 07/04/2015  . Chest pain with moderate risk for cardiac etiology 06/13/2015  .  Right sciatic nerve pain 10/19/2014  . Abnormal thyroid blood test 08/25/2014  . Chronic fatigue 08/09/2014  . Multiple myeloma ([place]) 07/21/2013  . Depression 01/14/2013  . Generalized anxiety disorder 01/14/2013  . Gastroesophageal reflux disease 01/13/2013  . Urine test positive for microalbuminuria 12/08/2011  . Cyst of ovary 12/17/2010  . KNEE PAIN, BILATERAL 08/15/2010  . Abnormal gait  08/15/2010  . Atherosclerosis of coronary artery bypass graft 10/11/2009  . CHEST PAIN, NON-CARDIAC 10/11/2009  . HIP PAIN, RIGHT 08/13/2009  . GREATER TROCHANTERIC BURSITIS 08/13/2009  . LUMBOSACRAL SPONDYLOSIS WITHOUT MYELOPATHY 11/10/2008  . Osteoarthritis of lumbosacral spine without myelopathy 11/10/2008  . FOOT PAIN, BILATERAL 06/30/2008  . Hyperlipidemia 06/22/2008  . Hypertension 06/22/2008  . Raynaud's disease 06/22/2008  . BURSITIS, LEFT SHOULDER 06/22/2008    Current Outpatient Medications on File Prior to Visit  Medication Sig Dispense Refill  . acetaminophen (TYLENOL) 650 MG CR tablet Take 1,300 mg by mouth 2 (two) times daily.    . Alum Hydroxide-Mag Carbonate (GAVISCON EXTRA STRENGTH) 508-475 MG/10ML SUSP Take 15 mLs by mouth 4 (four) times daily as needed (for reflux).     . Artificial Tear Solution (SOOTHE XP) SOLN Place 1 drop into both eyes 2 (two) times daily.    Marland Kitchen aspirin EC 81 MG tablet Take 1 tablet (81 mg total) by mouth daily. 90 tablet 3  . baclofen 5 MG TABS Take 5 mg by mouth 3 (three) times daily as needed for muscle spasms. 30 each 0  . Biotin 5000 MCG SUBL Place 10,000 mcg under the tongue daily.    . Calcium Carb-Cholecalciferol (CALCIUM 600+D3) 600-200 MG-UNIT TABS Take 1 tablet by mouth 2 (two) times daily.    . carvedilol (COREG) 3.125 MG tablet Take 1 tablet (3.125 mg total) by mouth 2 (two) times daily with a meal. 60 tablet 3  . cholestyramine (QUESTRAN) 4 g packet Take 2 g by mouth every other day as needed (diarrhea).    . conjugated estrogens (PREMARIN) vaginal cream Place 0.5 g vaginally See admin instructions. Insert 0.5 g vaginally 2 times a week as needed/as directed    . cycloSPORINE (RESTASIS) 0.05 % ophthalmic emulsion Place 1 drop into both eyes 2 (two) times daily.     Marland Kitchen docusate sodium (COLACE) 100 MG capsule Take 100 mg by mouth See admin instructions. Take 100 mg by mouth once a day and an additional 100 mg as needed for constipation     . famotidine (PEPCID) 20 MG tablet Take 1 tablet (20 mg total) by mouth 2 (two) times daily. 60 tablet 3  . gabapentin (NEURONTIN) 100 MG capsule Take 100 mg by mouth at bedtime.    . hydrALAZINE (APRESOLINE) 10 MG tablet Take 1 tablet (10 mg total) by mouth daily at 3 pm. (Patient taking differently: Take 10 mg by mouth See admin instructions. Take 10 mg by mouth daily at 2:30 PM and an additional 10 mg two times a day as needed if Systolic reading is >597) 90 tablet 3  . hydrALAZINE (APRESOLINE) 25 MG tablet Take 1 tablet (25 mg total) by mouth 2 (two) times daily. 8 am and 10 pm (Patient taking differently: Take 25 mg by mouth 2 (two) times daily. ) 180 tablet 3  . Hyoscyamine Sulfate SL (LEVSIN/SL) 0.125 MG SUBL Take 1 tablet, dissolve under the tongue every 6 hours as needed for abdominal pain. (Patient taking differently: Place 0.125 mg under the tongue every 6 (six) hours as needed (for abdominal pain). )  20 each 1  . iron polysaccharides (NU-IRON) 150 MG capsule Take 150 mg by mouth 2 (two) times daily.    . isosorbide mononitrate (IMDUR) 60 MG 24 hr tablet Take 60 mg by mouth daily.    Marland Kitchen lactobacillus acidophilus (BACID) TABS tablet Take 1 tablet by mouth 2 (two) times daily.    Marland Kitchen levocetirizine (XYZAL) 5 MG tablet Take 1 tablet (5 mg total) by mouth every evening. (Patient taking differently: Take 5 mg by mouth at bedtime. ) 30 tablet 5  . lidocaine (XYLOCAINE) 4 % external solution Apply topically as needed. To toes for pain 50 mL 0  . LORazepam (ATIVAN) 0.5 MG tablet Take 1 tablet (0.5 mg total) by mouth See admin instructions. Take 0.5 mg by mouth at bedtime and 0.5 mg two times a day as needed for anxiety 10 tablet 0  . Melatonin 5 MG TABS Take 5 mg by mouth at bedtime.    . memantine (NAMENDA) 10 MG tablet Take 10 mg by mouth 2 (two) times daily.    . Menthol, Topical Analgesic, (BIOFREEZE) 4 % GEL Apply topically as needed.    . methylcellulose (CITRUCEL) oral powder Take 0.5-1  packets by mouth 2 (two) times daily as needed (for constipation).    . mirabegron ER (MYRBETRIQ) 50 MG TB24 tablet Take 50 mg by mouth daily.    . montelukast (SINGULAIR) 10 MG tablet Take 10 mg by mouth at bedtime.     . Multiple Vitamins-Minerals (CENTRUM SILVER 50+WOMEN) TABS Take 1 tablet by mouth daily.    . NEOMYCIN-POLYMYXIN-HYDROCORTISONE (CORTISPORIN) 1 % SOLN OTIC solution Apply 1-2 drops to both toes once daily until all used 10 mL 0  . nitroGLYCERIN (NITROSTAT) 0.4 MG SL tablet Place 1 tablet (0.4 mg total) under the tongue every 5 (five) minutes as needed for chest pain. Reported on 11/12/2015 25 tablet 2  . Peppermint Oil (IBGARD) 90 MG CPCR Take 2 capsules by mouth as directed. Per box instructions (Patient taking differently: Take 1 capsule by mouth 2 (two) times daily before a meal. ) 1 capsule 0  . polyethylene glycol (MIRALAX / GLYCOLAX) packet Take 17 g by mouth 2 (two) times daily. (Patient taking differently: Take 8.5-17 g by mouth See admin instructions. Mix and drink 17 grams two times a day and an additional 8.5-17 grams once daily, as needed for constipation) 1 each 0  . prednisoLONE acetate (PRED FORTE) 1 % ophthalmic suspension Place 1 drop into both eyes 2 (two) times daily as needed ("for bilateral conjunctivochalasis").    . Propylene Glycol (SYSTANE BALANCE) 0.6 % SOLN Place 1 drop into both eyes 4 (four) times daily as needed.    . sertraline (ZOLOFT) 25 MG tablet Take 25 mg by mouth every morning.    . sertraline (ZOLOFT) 50 MG tablet Take 50 mg by mouth every evening.    . traMADol (ULTRAM) 50 MG tablet Take 1 tablet (50 mg total) by mouth every 8 (eight) hours as needed for moderate pain. 10 tablet 0   No current facility-administered medications on file prior to visit.     Allergies  Allergen Reactions  . Butazolidin [Phenylbutazone] Swelling and Rash  . Cephalosporins Other (See Comments)    Site of swelling not recalled  . Codeine Nausea And Vomiting  .  Levaquin [Levofloxacin] Shortness Of Breath  . Macrodantin Other (See Comments)    Double vision  . Morphine And Related Other (See Comments)    Hallucinations   . Trimethoprim  Nausea Only  . Aleve [Naproxen] Other (See Comments)    Was told by a MD to "never take this"  . Buprenorphine Other (See Comments)    "Allergic," per MAR  . Cilostazol Other (See Comments)    Stomach upset  . Dexilant [Dexlansoprazole] Diarrhea  . Diclofenac Other (See Comments)    Documented on MAR  . Erythromycin Nausea And Vomiting    All "mycin" drugs  . Lactose Intolerance (Gi) Diarrhea  . Morphine Other (See Comments)    Reaction not recalled  . Simvastatin Other (See Comments)    Muscle aches   . Sulfa Antibiotics Other (See Comments)    "Allergic," per MAR  . Vesicare [Solifenacin] Other (See Comments)    Reaction not recalled by the patient- gets a lot of UTI(s)  . Clindamycin/Lincomycin Other (See Comments)    Upsets the stomach  . Doxycycline Diarrhea  . Food Other (See Comments)    No spicy foods or black pepper, raw onions - causes gerd  . Ibuprofen Hives and Rash    Was told by a MD to "never take this"  . Lincomycin Hcl Nausea Only and Other (See Comments)    Upsets the stomach  . Penicillins Rash    Has patient had a PCN reaction causing immediate rash, facial/tongue/throat swelling, SOB or lightheadedness with hypotension: Yes Has patient had a PCN reaction causing severe rash involving mucus membranes or skin necrosis: No Has patient had a PCN reaction that required hospitalization No Has patient had a PCN reaction occurring within the last 10 years: No If all of the above answers are "NO", then may proceed with Cephalosporin use.   Lisbeth Ply [Fesoterodine Fumarate Er] Other (See Comments)    Reaction not recalled  . Voltaren [Diclofenac Sodium] Rash    Objective: Physical Exam  General: Well developed, nourished, no acute distress, awake, alert and oriented x  2  Vascular: Dorsalis pedis artery 1/4 bilateral, Posterior tibial artery 0/4 bilateral, skin temperature warm to warm proximal to distal bilateral lower extremities, mild varicosities, pedal hair present bilateral.  Neurological: Gross sensation present via light touch bilateral.   Dermatological: Skin is warm, dry, and supple bilateral, Nails 1-10 are tender, long, thick, and discolored with mild subungal debris with mild ingrowing without infection at Left>Right medial hallux, no webspace macerations present bilateral, no open lesions present bilateral, no callus/corns/hyperkeratotic tissue present bilateral. No signs of infection bilateral.  Musculoskeletal: Asymptomatic hallux malleous L>R boney deformities noted bilateral. Muscular strength 4/5 without painon range of motion.+ History of arthritis. No pain with calf compression bilateral.  Assessment and Plan:  Problem List Items Addressed This Visit    None    Visit Diagnoses    Nail dystrophy    -  Primary   Ingrown nail       Toe pain, bilateral          -Examined patient.  -Discussed treatment options for painful ingrowning minimally mycotic nails as previous. -Mechanically debrided and reduced nails with sterile nail nipper and dremel nail file without incident and remove the offending nail borders to patient's tolerance. -Recommend again Epsom salt soaks and corticosporin solution as needed may call for a refill when needed -Patient also have K pain cream to use to toes as needed -Patient to return in 2.5 months for nail trim or sooner if symptoms worsen.  Landis Martins, DPM

## 2018-11-17 ENCOUNTER — Telehealth: Payer: Self-pay | Admitting: *Deleted

## 2018-11-17 NOTE — Telephone Encounter (Signed)
I informed pt's dtr, [name] of Dr. Leeanne Rio 11/16/2018 8:52pm statement and recommendations. [name] states understanding and thanked me for the call.

## 2018-11-17 NOTE — Telephone Encounter (Signed)
-----   Message from Landis Martins, [place] sent at 11/16/2018  8:52 PM EDT ----- Regarding: Call Daughter I didn't get a chance to call daughter but can you let her know that her mom was seen and that on her big toe nails the nails tend to grow into the skin and causes pain at the corners. At this visit I trimmed her toenails and removed any corners that were tender and advised her mom to come every 10 weeks to have her nails trimmed to avoid worsening pain at her nails. Patient has corticosporin solution to use if the corners of her nails get sore and may also soak with epsom salt as needed. Patient also has topical lidocaine cream that she can use if toes are painful as well. Patient to return in 10 weeks for nail trim. Instructions were written out for the patient and I provided these same instructions to [place] as well.-Dr. Cannon Kettle

## 2018-11-18 ENCOUNTER — Telehealth: Payer: PPO | Admitting: Cardiology

## 2018-11-26 ENCOUNTER — Telehealth: Payer: Self-pay | Admitting: Internal Medicine

## 2018-11-26 NOTE — Telephone Encounter (Signed)
Pt stated that her retirement home puts pepper on everything and that that causes her GERD.  She would like to know whether she could take Pepcid more than BID.  Please advise.

## 2018-11-26 NOTE — Telephone Encounter (Signed)
Patient called and would like to know if she can take a higher dose of Pepcid than 20mg  , or if she can take the 20mg  more often than twice a day. Please advise

## 2018-11-29 DIAGNOSIS — K120 Recurrent oral aphthae: Secondary | ICD-10-CM | POA: Diagnosis not present

## 2018-11-29 NOTE — Telephone Encounter (Signed)
Called patient back. She wants to do the Pepcid 40mg  BID. I called the nurse line at [facility] at [address]. change order to :  [phone]

## 2018-11-29 NOTE — Telephone Encounter (Signed)
She can take pepcid (famotidine) 20 mg TID or up to 40 mg BID

## 2018-12-01 ENCOUNTER — Telehealth: Payer: Self-pay | Admitting: *Deleted

## 2018-12-01 NOTE — Telephone Encounter (Signed)
Tried calling patient to make upcoming visit 6/30 and office visit instead of virtual visit  No answer

## 2018-12-02 NOTE — Telephone Encounter (Signed)
Left message to call back to see about moving appointment to Monday telephone visit

## 2018-12-02 NOTE — Telephone Encounter (Signed)
New message:   Patient daughter calling stating that her mother is in a nursing home and can not come to a ov. She can get her telephone land line appt. Please call patient daughter back.

## 2018-12-03 DIAGNOSIS — S40811A Abrasion of right upper arm, initial encounter: Secondary | ICD-10-CM | POA: Diagnosis not present

## 2018-12-03 NOTE — Telephone Encounter (Signed)
Spoke with daughter and changed patient to telephone visit

## 2018-12-05 ENCOUNTER — Encounter: Payer: Self-pay | Admitting: Cardiology

## 2018-12-05 NOTE — Progress Notes (Signed)
Virtual Visit via Telephone Note   This visit type was conducted due to national recommendations for restrictions regarding the COVID-19 Pandemic (e.g. social distancing) in an effort to limit this patient's exposure and mitigate transmission in our community.  Due to her co-morbid illnesses, this patient is at least at moderate risk for complications without adequate follow up.  This format is felt to be most appropriate for this patient at this time.  The patient did not have access to video technology/had technical difficulties with video requiring transitioning to audio format only (telephone).  All issues noted in this document were discussed and addressed.  No physical exam could be performed with this format.  Please refer to the patient's chart for her  consent to telehealth for [facility].   Date:  12/06/2018   ID:  [name], [name] 08/30/1931, MRN [id number]  Patient Location: Home Provider Location: Home  PCP:  [facility]  Cardiologist:  Minus Breeding, MD  Electrophysiologist:  None   Evaluation Performed:  Follow-Up Visit  Chief Complaint:  CAD  History of Present Illness:    [name] is a 85 y.o. female who presents for ongoing assessment and management of CAD with known history of PCI of a Cx with 90% stenosis,01/2017, CABG LIMA to LAD, SVG to RCA 08/2009, labile hypertension, non-cardiac chest pain, abdominal pain and significant anxiety.   She is fatigued and stays up to late.  She naps during the day.  She is doing some classes at her nursing home.  The patient denies any new symptoms such as chest discomfort, neck or arm discomfort. There has been no new shortness of breath, PND or orthopnea. There have been no reported palpitations, presyncope or syncope. She is most bothered by GI complaints.    The patient does not have symptoms concerning for COVID-19 infection (fever, chills, cough, or new shortness of breath).    Past Medical  History:  Diagnosis Date  . Anxiety   . Arthritis   . CAD (coronary artery disease)   . Chondrocostal junction syndrome   . Chronic fatigue   . Dementia ([place])    without Behavioral Disturbance  . GERD (gastroesophageal reflux disease)   . Hx of CABG   . Hyperlipidemia    GOAL LDL <70  . Hypertension   . IBS (irritable bowel syndrome)   . Jaundice    yellow jaundice 3rd grade  . Major depressive disorder   . Mild chronic anemia   . Mixed incontinence   . Multiple myeloma (HCC)    not having achieved remission  . NSTEMI (non-ST elevated myocardial infarction) ([place]) 01/31/2017  . Osteopenia   . Ovarian cyst, bilateral    Rt complex  . Panic disorder   . Pulmonary nodules   . Radiculopathy   . Raynaud's syndrome   . Restless legs syndrome   . Spinal stenosis of lumbar region   . Thyroid nodule    Past Surgical History:  Procedure Laterality Date  . BUNIONECTOMY WITH HAMMERTOE RECONSTRUCTION Right 08/04/2012   Procedure:  HAMMERTOE RECONSTRUCTION;  Surgeon: Colin Rhein, MD;  Location: [place];  Service: Orthopedics;  Laterality: Right;  HAMMER TOE RECONSTRUCTION PROBABLE FLEXOR DIGITORUM LONGUS TO PROXIMAL PHALANX TRANSFER LATERALIZED   . CAPSULOTOMY Right 08/04/2012   Procedure: CAPSULOTOMY;  Surgeon: Colin Rhein, MD;  Location: [place];  Service: Orthopedics;  Laterality: Right;  RIGHT 2ND TOE METATARSOPHALANGEAL JOINT DORSAL CAPSULOTOMY   .  CATARACT EXTRACTION, BILATERAL     both cataracts  . COLONOSCOPY    . CORONARY ANGIOPLASTY WITH STENT PLACEMENT  2014   2 stents  . CORONARY ARTERY BYPASS GRAFT  2007   LIMA to LAD, SVG to RCA  non-ST segment MI 08/2009 (Dr. Tamala Julian)  . CORONARY STENT INTERVENTION N/A 02/02/2017   Procedure: CORONARY STENT INTERVENTION;  Surgeon: Belva Crome, MD;  Location: [place] [facility];  Service: Cardiovascular;  Laterality: N/A;  Prox CFX  . DILATION AND CURETTAGE OF UTERUS    . LEFT HEART CATH AND  CORS/GRAFTS ANGIOGRAPHY N/A 02/02/2017   Procedure: LEFT HEART CATH AND CORS/GRAFTS ANGIOGRAPHY;  Surgeon: Belva Crome, MD;  Location: [place] [facility];  Service: Cardiovascular;  Laterality: N/A;  . LEFT HEART CATHETERIZATION WITH CORONARY/GRAFT ANGIOGRAM N/A 07/09/2012   Procedure: LEFT HEART CATHETERIZATION WITH Beatrix Fetters;  Surgeon: Sinclair Grooms, MD;  Location: [facility] C[facility];  Service: Cardiovascular;  Laterality: N/A;  . TONSILLECTOMY AND ADENOIDECTOMY    . UPPER GASTROINTESTINAL ENDOSCOPY    . VISCERAL ANGIOGRAPHY N/A 04/15/2017   Procedure: VISCERAL ANGIOGRAPHY;  Surgeon: Waynetta Sandy, MD;  Location: [place] [facility];  Service: Cardiovascular;  Laterality: N/A;     Current Meds  Medication Sig  . acetaminophen (TYLENOL) 650 MG CR tablet Take 1,300 mg by mouth 2 (two) times daily.  . Alum Hydroxide-Mag Carbonate (GAVISCON EXTRA STRENGTH) 508-475 MG/10ML SUSP Take 15 mLs by mouth 4 (four) times daily as needed (for reflux).   . Artificial Tear Solution (SOOTHE XP) SOLN Place 1 drop into both eyes 2 (two) times daily.  Marland Kitchen aspirin EC 81 MG tablet Take 1 tablet (81 mg total) by mouth daily.  . baclofen 5 MG TABS Take 5 mg by mouth 3 (three) times daily as needed for muscle spasms.  . Biotin 5000 MCG SUBL Place 10,000 mcg under the tongue daily.  . Calcium Carb-Cholecalciferol (CALCIUM 600+D3) 600-200 MG-UNIT TABS Take 1 tablet by mouth 2 (two) times daily.  . carvedilol (COREG) 3.125 MG tablet Take 1 tablet (3.125 mg total) by mouth 2 (two) times daily with a meal.  . cholestyramine (QUESTRAN) 4 g packet Take 2 g by mouth every other day as needed (diarrhea).  . conjugated estrogens (PREMARIN) vaginal cream Place 0.5 g vaginally See admin instructions. Insert 0.5 g vaginally 2 times a week as needed/as directed  . cycloSPORINE (RESTASIS) 0.05 % ophthalmic emulsion Place 1 drop into both eyes 2 (two) times daily.   Marland Kitchen docusate sodium (COLACE) 100 MG capsule  Take 100 mg by mouth See admin instructions. Take 100 mg by mouth once a day and an additional 100 mg as needed for constipation  . famotidine (PEPCID) 20 MG tablet Take 1 tablet (20 mg total) by mouth 2 (two) times daily.  Marland Kitchen gabapentin (NEURONTIN) 100 MG capsule Take 100 mg by mouth at bedtime.  . hydrALAZINE (APRESOLINE) 10 MG tablet Take 1 tablet (10 mg total) by mouth daily at 3 pm. (Patient taking differently: Take 10 mg by mouth See admin instructions. Take 10 mg by mouth daily at 2:30 PM and an additional 10 mg two times a day as needed if Systolic reading is >130)  . hydrALAZINE (APRESOLINE) 25 MG tablet Take 1 tablet (25 mg total) by mouth 2 (two) times daily. 8 am and 10 pm (Patient taking differently: Take 25 mg by mouth 2 (two) times daily. )  . Hyoscyamine Sulfate SL (LEVSIN/SL) 0.125 MG SUBL Take  1 tablet, dissolve under the tongue every 6 hours as needed for abdominal pain. (Patient taking differently: Place 0.125 mg under the tongue every 6 (six) hours as needed (for abdominal pain). )  . iron polysaccharides (NU-IRON) 150 MG capsule Take 150 mg by mouth 2 (two) times daily.  . isosorbide mononitrate (IMDUR) 60 MG 24 hr tablet Take 60 mg by mouth daily.  Marland Kitchen lactobacillus acidophilus (BACID) TABS tablet Take 1 tablet by mouth 2 (two) times daily.  Marland Kitchen levocetirizine (XYZAL) 5 MG tablet Take 1 tablet (5 mg total) by mouth every evening. (Patient taking differently: Take 5 mg by mouth at bedtime. )  . lidocaine (XYLOCAINE) 4 % external solution Apply topically as needed. To toes for pain  . LORazepam (ATIVAN) 0.5 MG tablet Take 1 tablet (0.5 mg total) by mouth See admin instructions. Take 0.5 mg by mouth at bedtime and 0.5 mg two times a day as needed for anxiety  . Melatonin 5 MG TABS Take 5 mg by mouth at bedtime.  . memantine (NAMENDA) 10 MG tablet Take 10 mg by mouth 2 (two) times daily.  . Menthol, Topical Analgesic, (BIOFREEZE) 4 % GEL Apply topically as needed.  . methylcellulose  (CITRUCEL) oral powder Take 0.5-1 packets by mouth 2 (two) times daily as needed (for constipation).  . mirabegron ER (MYRBETRIQ) 50 MG TB24 tablet Take 25 mg by mouth daily.   . montelukast (SINGULAIR) 10 MG tablet Take 10 mg by mouth at bedtime.   . Multiple Vitamins-Minerals (CENTRUM SILVER 50+WOMEN) TABS Take 1 tablet by mouth daily.  . NEOMYCIN-POLYMYXIN-HYDROCORTISONE (CORTISPORIN) 1 % SOLN OTIC solution Apply 1-2 drops to both toes once daily until all used  . nitroGLYCERIN (NITROSTAT) 0.4 MG SL tablet Place 1 tablet (0.4 mg total) under the tongue every 5 (five) minutes as needed for chest pain. Reported on 11/12/2015  . Peppermint Oil (IBGARD) 90 MG CPCR Take 2 capsules by mouth as directed. Per box instructions (Patient taking differently: Take 1 capsule by mouth 2 (two) times daily before a meal. )  . polyethylene glycol (MIRALAX / GLYCOLAX) packet Take 17 g by mouth 2 (two) times daily. (Patient taking differently: Take 8.5-17 g by mouth See admin instructions. Mix and drink 17 grams two times a day and an additional 8.5-17 grams once daily, as needed for constipation)  . Propylene Glycol (SYSTANE BALANCE) 0.6 % SOLN Place 1 drop into both eyes 4 (four) times daily as needed.  . sertraline (ZOLOFT) 25 MG tablet Take 25 mg by mouth every morning.  . sertraline (ZOLOFT) 50 MG tablet Take 50 mg by mouth every evening.  . [DISCONTINUED] traMADol (ULTRAM) 50 MG tablet Take 1 tablet (50 mg total) by mouth every 8 (eight) hours as needed for moderate pain.     Allergies:   Butazolidin [phenylbutazone], Cephalosporins, Codeine, Levaquin [levofloxacin], Macrodantin, Morphine and related, Trimethoprim, Aleve [naproxen], Buprenorphine, Cilostazol, Dexilant [dexlansoprazole], Diclofenac, Erythromycin, Lactose intolerance (gi), Morphine, Simvastatin, Sulfa antibiotics, Vesicare [solifenacin], Clindamycin/lincomycin, Doxycycline, Food, Ibuprofen, Lincomycin hcl, Penicillins, Toviaz [fesoterodine fumarate  er], and Voltaren [diclofenac sodium]   Social History   Tobacco Use  . Smoking status: Never Smoker  . Smokeless tobacco: Never Used  Substance Use Topics  . Alcohol use: Never    Frequency: Never  . Drug use: No     Family Hx: The patient's family history includes Breast cancer in her paternal aunt; CAD in her mother; Heart attack in her mother; Obesity in her sister; Ulcers in her father. There is  no history of Asthma, Eczema, Urticaria, Allergic rhinitis, Angioedema, Atopy, or Immunodeficiency.  ROS:   Please see the history of present illness.    As stated in the HPI and negative for all other systems.   Prior CV studies:   The following studies were reviewed today:  None  Labs/Other Tests and Data Reviewed:    EKG:  No ECG reviewed.  Recent Labs: 08/09/2018: ALT 14; BUN 23; Creatinine 0.90; Hemoglobin 8.6; Platelet Count 209; Potassium 3.8; Sodium 134   Recent Lipid Panel Lab Results  Component Value Date/Time   CHOL 152 02/01/2017 09:19 AM   TRIG 41 02/01/2017 09:19 AM   HDL 75 02/01/2017 09:19 AM   CHOLHDL 2.0 02/01/2017 09:19 AM   LDLCALC 69 02/01/2017 09:19 AM    Wt Readings from Last 3 Encounters:  12/06/18 95 lb (43.1 kg)  08/09/18 97 lb 9.6 oz (44.3 kg)  05/26/18 96 lb (43.5 kg)     Objective:    Vital Signs:  BP (!) 158/65   Pulse 73   Ht '5\' 3"'  (1.6 m)   Wt 95 lb (43.1 kg)   SpO2 96%   BMI 16.83 kg/m    VITAL SIGNS:  reviewed  ASSESSMENT & PLAN:    CAD:    She is off of Plavix which she took for quite awhile.  No unstable symptoms.  No change in therapy.    Chronic abdominal pain:   She is being managed actively by Dr. Hilarie Fredrickson. She is taking Pecid twice daily.  She is trying to work on her diet.     Hypertension:   She likes taking the hydralazine in the middle of the day and her BP is usually well controlled and I have a diary list to look at tomorrow when I am back in the office.  I will review and make changes as needed.    COVID-19  Education: The signs and symptoms of COVID-19 were discussed with the patient and how to seek care for testing (follow up with PCP or arrange E-visit).  The importance of social distancing was discussed today.  Time:   Today, I have spent 21 minutes with the patient with telehealth technology discussing the above problems.     Medication Adjustments/Labs and Tests Ordered: Current medicines are reviewed at length with the patient today.  Concerns regarding medicines are outlined above.   Tests Ordered: No orders of the defined types were placed in this encounter.   Medication Changes: No orders of the defined types were placed in this encounter.   Follow Up:  Virtual Visit or In Person in six months  Signed, Minus Breeding, MD  12/06/2018 9:31 AM    [place]

## 2018-12-06 ENCOUNTER — Encounter: Payer: Self-pay | Admitting: Cardiology

## 2018-12-06 ENCOUNTER — Telehealth (INDEPENDENT_AMBULATORY_CARE_PROVIDER_SITE_OTHER): Payer: PPO | Admitting: Cardiology

## 2018-12-06 VITALS — BP 158/65 | HR 73 | Ht 63.0 in | Wt 95.0 lb

## 2018-12-06 DIAGNOSIS — Z7189 Other specified counseling: Secondary | ICD-10-CM

## 2018-12-06 DIAGNOSIS — I2510 Atherosclerotic heart disease of native coronary artery without angina pectoris: Secondary | ICD-10-CM

## 2018-12-06 DIAGNOSIS — I10 Essential (primary) hypertension: Secondary | ICD-10-CM

## 2018-12-06 NOTE — Patient Instructions (Signed)
Medication Instructions:  Your physician recommends that you continue on your current medications as directed. Please refer to the Current Medication list given to you today.  If you need a refill on your cardiac medications before your next appointment, please call your pharmacy.   Lab work: NONE  Testing/Procedures: NONE  Follow-Up: At [facility], you and your health needs are our priority.  As part of our continuing mission to provide you with exceptional heart care, we have created designated Provider Care Teams.  These Care Teams include your primary Cardiologist (physician) and Advanced Practice Providers (APPs -  Physician Assistants and Nurse Practitioners) who all work together to provide you with the care you need, when you need it. You will need a follow up appointment in 4 months with Jory Sims, DNP, ANP Please call our office 2 months in advance to schedule this appointment.

## 2018-12-07 ENCOUNTER — Telehealth: Payer: PPO | Admitting: Cardiology

## 2018-12-07 NOTE — Progress Notes (Signed)
done

## 2018-12-14 ENCOUNTER — Telehealth: Payer: Self-pay | Admitting: *Deleted

## 2018-12-14 ENCOUNTER — Telehealth: Payer: Self-pay | Admitting: Cardiology

## 2018-12-14 DIAGNOSIS — R300 Dysuria: Secondary | ICD-10-CM | POA: Diagnosis not present

## 2018-12-14 DIAGNOSIS — R350 Frequency of micturition: Secondary | ICD-10-CM | POA: Diagnosis not present

## 2018-12-14 NOTE — Telephone Encounter (Signed)
Patient called very upset that her daughter cancelled her appointment for 12/16/18 "without my permission". This RN called daughter, who agreed to return her to the schedule. She thought our office was closed. Made her aware of COVID precautions being taken here. She will let her mom know that it was added back.

## 2018-12-14 NOTE — Telephone Encounter (Signed)
After review, I am unable to tell why ultram was removed from her list. I will forward to DR/Nurse to review.

## 2018-12-14 NOTE — Telephone Encounter (Signed)
POA was updated and was satisfied with conversation.

## 2018-12-14 NOTE — Telephone Encounter (Signed)
It was taken off of the list during the med rec as we understood that she was no longer using this PRN drug.  I did not stop this and it could be resumed per her primary MD.

## 2018-12-14 NOTE — Telephone Encounter (Signed)
  Daughter is calling because she would like to know why Dr Percival Spanish took her mother off the tramadol at her last visit. Please call.

## 2018-12-15 ENCOUNTER — Telehealth: Payer: Self-pay | Admitting: Diagnostic Neuroimaging

## 2018-12-15 DIAGNOSIS — R300 Dysuria: Secondary | ICD-10-CM | POA: Diagnosis not present

## 2018-12-15 NOTE — Telephone Encounter (Signed)
Patient daughter [name] who is the POA called in stating that the patient either takes Namedna or Aricett and she is wondering if they are at the maximum dose's or if there is any way it can be increased. She is in an assisting living and due to everything going on her cognitive has declined.  She states she does not want any changes to be made without speaking to her. Her phone number is [phone].

## 2018-12-15 NOTE — Telephone Encounter (Signed)
LVM for daughter, [name] advising her that Dr Leta Baptist has never prescribed these medications for her mother. She is on Namenda per her chart. Dr Leta Baptist last saw her May 2019 and released her to her PCP's care.  I advised she call PCP at assisted living facility to discuss medication changes. Left number for further questions.

## 2018-12-16 ENCOUNTER — Inpatient Hospital Stay: Payer: PPO

## 2018-12-16 ENCOUNTER — Ambulatory Visit: Payer: Self-pay | Admitting: Oncology

## 2018-12-16 ENCOUNTER — Inpatient Hospital Stay: Payer: PPO | Admitting: Oncology

## 2018-12-16 ENCOUNTER — Other Ambulatory Visit: Payer: Self-pay

## 2018-12-22 DIAGNOSIS — D649 Anemia, unspecified: Secondary | ICD-10-CM | POA: Diagnosis not present

## 2018-12-28 ENCOUNTER — Encounter: Payer: Self-pay | Admitting: Sports Medicine

## 2018-12-28 ENCOUNTER — Ambulatory Visit (INDEPENDENT_AMBULATORY_CARE_PROVIDER_SITE_OTHER): Payer: PPO | Admitting: Sports Medicine

## 2018-12-28 ENCOUNTER — Other Ambulatory Visit: Payer: Self-pay

## 2018-12-28 VITALS — Temp 97.6°F | Resp 16

## 2018-12-28 DIAGNOSIS — L603 Nail dystrophy: Secondary | ICD-10-CM | POA: Diagnosis not present

## 2018-12-28 DIAGNOSIS — M79674 Pain in right toe(s): Secondary | ICD-10-CM

## 2018-12-28 DIAGNOSIS — L600 Ingrowing nail: Secondary | ICD-10-CM

## 2018-12-28 DIAGNOSIS — M79675 Pain in left toe(s): Secondary | ICD-10-CM

## 2018-12-28 NOTE — Progress Notes (Signed)
Subjective: [name] is a 85 y.o. female patient seen today in office with complaint of mildly painful thickened and elongated toenails especially at left 1st toe medial margin most painful than the right great toe medial margin; unable to trim. Patient states that her ingrown nails are starting to be painful again reports that they were doing well for about 5 weeks before she started to have pain has been using Corticosporin and soaking with no relief.  Patient is from [facility] and reports that the staff there is giving her a difficult time with leaving for doctors office visits because of the COVID-19 virus and has not been able to get open toe sandals or open toe compression stockings.  No other issues at this time.  Patient Active Problem List   Diagnosis Date Noted  . Left hip pain 06/30/2018  . Disc degeneration, lumbar 06/23/2018  . Left thyroid nodule 08/29/2017  . Chronic idiopathic constipation 08/18/2017  . Abdominal pain 07/31/2017  . Mesenteric ischemia ([place]) 05/04/2017  . Unsteadiness on feet   . Unstable angina pectoris ([place])   . Tinea pedis   . Spinal stenosis of lumbar region   . Somnolence   . Seasonal allergic rhinitis   . Restless legs syndrome   . Raynaud's syndrome   . Raynauds syndrome   . Radiculopathy   . Panic disorder   . Ovarian cyst, bilateral   . Osteopenia   . Myocardial infarction ([place])   . Muscle weakness (generalized)   . Mixed incontinence   . Mild chronic anemia   . Major depressive disorder   . Long term (current) use of aspirin   . Intermediate coronary syndrome ([place])   . Incontinent of urine   . IBS (irritable bowel syndrome)   . Hypercholesteremia   . Hx of CABG   . GERD (gastroesophageal reflux disease)   . Dementia ([place])   . Chest pain   . Atherosclerotic heart disease of native coronary artery without angina pectoris   . Arthritis   . Anxiety   . Allergic rhinitis   . Abnormal weight loss   . History of adenomatous  polyp of colon 03/02/2017  . NSTEMI (non-ST elevated myocardial infarction) ([place]) 02/02/2017  . SOB (shortness of breath) 09/02/2016  . Dizziness 09/02/2016  . Non-ST elevation (NSTEMI) myocardial infarction ([place]) 11/26/2015  . Polymyalgia rheumatica ([place]) 11/26/2015  . Hypokalemia 11/26/2015  . Mild persistent asthma 11/12/2015  . Coronary artery disease involving autologous artery coronary bypass graft with angina pectoris with documented spasm ([place]) 11/12/2015  . Multiple myeloma in remission ([place]) 11/12/2015  . Current use of beta blocker 11/12/2015  . Gastroesophageal reflux disease without esophagitis 11/12/2015  . Other allergic rhinitis 11/12/2015  . Weight loss, non-intentional 10/03/2015  . Asthma 09/24/2015  . Asthma in adult without complication 41/08/129  . Seasonal allergic rhinitis due to pollen 09/24/2015  . Cervical radiculopathy 07/25/2015  . Chronic coronary artery disease 07/25/2015  . Coronary artery disease 07/25/2015  . Disturbance in sleep behavior 07/25/2015  . Elevated CK 07/25/2015  . Elevated creatine kinase level 07/25/2015  . Generalized osteoarthritis of hand 07/25/2015  . Other long term (current) drug therapy 07/25/2015  . Overactive bladder 07/25/2015  . Restless leg 07/25/2015  . Restless leg syndrome 07/25/2015  . Sleep disturbances 07/25/2015  . Idiopathic peripheral neuropathy 07/04/2015  . Lumbar radiculopathy, chronic 07/04/2015  . Mixed dementia ([place]) 07/04/2015  . Risk for falls 07/04/2015  . Sleep disturbance 07/04/2015  .  Chest pain with moderate risk for cardiac etiology 06/13/2015  . Right sciatic nerve pain 10/19/2014  . Abnormal thyroid blood test 08/25/2014  . Chronic fatigue 08/09/2014  . Multiple myeloma ([place]) 07/21/2013  . Depression 01/14/2013  . Generalized anxiety disorder 01/14/2013  . Gastroesophageal reflux disease 01/13/2013  . Urine test positive for microalbuminuria 12/08/2011  . Cyst of ovary 12/17/2010  . KNEE  PAIN, BILATERAL 08/15/2010  . Abnormal gait 08/15/2010  . Atherosclerosis of coronary artery bypass graft 10/11/2009  . CHEST PAIN, NON-CARDIAC 10/11/2009  . HIP PAIN, RIGHT 08/13/2009  . GREATER TROCHANTERIC BURSITIS 08/13/2009  . LUMBOSACRAL SPONDYLOSIS WITHOUT MYELOPATHY 11/10/2008  . Osteoarthritis of lumbosacral spine without myelopathy 11/10/2008  . FOOT PAIN, BILATERAL 06/30/2008  . Hyperlipidemia 06/22/2008  . Hypertension 06/22/2008  . Raynaud's disease 06/22/2008  . BURSITIS, LEFT SHOULDER 06/22/2008    Current Outpatient Medications on File Prior to Visit  Medication Sig Dispense Refill  . acetaminophen (TYLENOL) 650 MG CR tablet Take 1,300 mg by mouth 2 (two) times daily.    . Alum Hydroxide-Mag Carbonate (GAVISCON EXTRA STRENGTH) 508-475 MG/10ML SUSP Take 15 mLs by mouth 4 (four) times daily as needed (for reflux).     . Artificial Tear Solution (SOOTHE XP) SOLN Place 1 drop into both eyes 2 (two) times daily.    Marland Kitchen aspirin EC 81 MG tablet Take 1 tablet (81 mg total) by mouth daily. 90 tablet 3  . baclofen 5 MG TABS Take 5 mg by mouth 3 (three) times daily as needed for muscle spasms. 30 each 0  . Biotin 5000 MCG SUBL Place 10,000 mcg under the tongue daily.    . Calcium Carb-Cholecalciferol (CALCIUM 600+D3) 600-200 MG-UNIT TABS Take 1 tablet by mouth 2 (two) times daily.    . carvedilol (COREG) 3.125 MG tablet Take 1 tablet (3.125 mg total) by mouth 2 (two) times daily with a meal. 60 tablet 3  . cholestyramine (QUESTRAN) 4 g packet Take 2 g by mouth every other day as needed (diarrhea).    . conjugated estrogens (PREMARIN) vaginal cream Place 0.5 g vaginally See admin instructions. Insert 0.5 g vaginally 2 times a week as needed/as directed    . cycloSPORINE (RESTASIS) 0.05 % ophthalmic emulsion Place 1 drop into both eyes 2 (two) times daily.     Marland Kitchen docusate sodium (COLACE) 100 MG capsule Take 100 mg by mouth See admin instructions. Take 100 mg by mouth once a day and an  additional 100 mg as needed for constipation    . famotidine (PEPCID) 20 MG tablet Take 1 tablet (20 mg total) by mouth 2 (two) times daily. 60 tablet 3  . gabapentin (NEURONTIN) 100 MG capsule Take 100 mg by mouth at bedtime.    . hydrALAZINE (APRESOLINE) 10 MG tablet Take 1 tablet (10 mg total) by mouth daily at 3 pm. (Patient taking differently: Take 10 mg by mouth See admin instructions. Take 10 mg by mouth daily at 2:30 PM and an additional 10 mg two times a day as needed if Systolic reading is >964) 90 tablet 3  . hydrALAZINE (APRESOLINE) 25 MG tablet Take 1 tablet (25 mg total) by mouth 2 (two) times daily. 8 am and 10 pm (Patient taking differently: Take 25 mg by mouth 2 (two) times daily. ) 180 tablet 3  . Hyoscyamine Sulfate SL (LEVSIN/SL) 0.125 MG SUBL Take 1 tablet, dissolve under the tongue every 6 hours as needed for abdominal pain. (Patient taking differently: Place 0.125 mg under the  tongue every 6 (six) hours as needed (for abdominal pain). ) 20 each 1  . iron polysaccharides (NU-IRON) 150 MG capsule Take 150 mg by mouth 2 (two) times daily.    . isosorbide mononitrate (IMDUR) 60 MG 24 hr tablet Take 60 mg by mouth daily.    Marland Kitchen lactobacillus acidophilus (BACID) TABS tablet Take 1 tablet by mouth 2 (two) times daily.    Marland Kitchen levocetirizine (XYZAL) 5 MG tablet Take 1 tablet (5 mg total) by mouth every evening. (Patient taking differently: Take 5 mg by mouth at bedtime. ) 30 tablet 5  . lidocaine (XYLOCAINE) 4 % external solution Apply topically as needed. To toes for pain 50 mL 0  . LORazepam (ATIVAN) 0.5 MG tablet Take 1 tablet (0.5 mg total) by mouth See admin instructions. Take 0.5 mg by mouth at bedtime and 0.5 mg two times a day as needed for anxiety 10 tablet 0  . Melatonin 5 MG TABS Take 5 mg by mouth at bedtime.    . memantine (NAMENDA) 10 MG tablet Take 10 mg by mouth 2 (two) times daily.    . Menthol, Topical Analgesic, (BIOFREEZE) 4 % GEL Apply topically as needed.    .  methylcellulose (CITRUCEL) oral powder Take 0.5-1 packets by mouth 2 (two) times daily as needed (for constipation).    . mirabegron ER (MYRBETRIQ) 50 MG TB24 tablet Take 25 mg by mouth daily.     . montelukast (SINGULAIR) 10 MG tablet Take 10 mg by mouth at bedtime.     . Multiple Vitamins-Minerals (CENTRUM SILVER 50+WOMEN) TABS Take 1 tablet by mouth daily.    . NEOMYCIN-POLYMYXIN-HYDROCORTISONE (CORTISPORIN) 1 % SOLN OTIC solution Apply 1-2 drops to both toes once daily until all used 10 mL 0  . nitroGLYCERIN (NITROSTAT) 0.4 MG SL tablet Place 1 tablet (0.4 mg total) under the tongue every 5 (five) minutes as needed for chest pain. Reported on 11/12/2015 25 tablet 2  . Peppermint Oil (IBGARD) 90 MG CPCR Take 2 capsules by mouth as directed. Per box instructions (Patient taking differently: Take 1 capsule by mouth 2 (two) times daily before a meal. ) 1 capsule 0  . polyethylene glycol (MIRALAX / GLYCOLAX) packet Take 17 g by mouth 2 (two) times daily. (Patient taking differently: Take 8.5-17 g by mouth See admin instructions. Mix and drink 17 grams two times a day and an additional 8.5-17 grams once daily, as needed for constipation) 1 each 0  . Propylene Glycol (SYSTANE BALANCE) 0.6 % SOLN Place 1 drop into both eyes 4 (four) times daily as needed.    . sertraline (ZOLOFT) 25 MG tablet Take 25 mg by mouth every morning.    . sertraline (ZOLOFT) 50 MG tablet Take 50 mg by mouth every evening.    . traMADol (ULTRAM) 50 MG tablet Take by mouth every 8 (eight) hours as needed.      No current facility-administered medications on file prior to visit.     Allergies  Allergen Reactions  . Butazolidin [Phenylbutazone] Swelling and Rash  . Cephalosporins Other (See Comments)    Site of swelling not recalled  . Codeine Nausea And Vomiting  . Levaquin [Levofloxacin] Shortness Of Breath  . Macrodantin Other (See Comments)    Double vision  . Morphine And Related Other (See Comments)     Hallucinations   . Trimethoprim Nausea Only  . Aleve [Naproxen] Other (See Comments)    Was told by a MD to "never take this"  .  Buprenorphine Other (See Comments)    "Allergic," per MAR  . Cilostazol Other (See Comments)    Stomach upset  . Dexilant [Dexlansoprazole] Diarrhea  . Diclofenac Other (See Comments)    Documented on MAR  . Erythromycin Nausea And Vomiting    All "mycin" drugs  . Lactose Intolerance (Gi) Diarrhea  . Morphine Other (See Comments)    Reaction not recalled  . Simvastatin Other (See Comments)    Muscle aches   . Sulfa Antibiotics Other (See Comments)    "Allergic," per MAR  . Vesicare [Solifenacin] Other (See Comments)    Reaction not recalled by the patient- gets a lot of UTI(s)  . Clindamycin/Lincomycin Other (See Comments)    Upsets the stomach  . Doxycycline Diarrhea  . Food Other (See Comments)    No spicy foods or black pepper, raw onions - causes gerd  . Ibuprofen Hives and Rash    Was told by a MD to "never take this"  . Lincomycin Hcl Nausea Only and Other (See Comments)    Upsets the stomach  . Penicillins Rash    Has patient had a PCN reaction causing immediate rash, facial/tongue/throat swelling, SOB or lightheadedness with hypotension: Yes Has patient had a PCN reaction causing severe rash involving mucus membranes or skin necrosis: No Has patient had a PCN reaction that required hospitalization No Has patient had a PCN reaction occurring within the last 10 years: No If all of the above answers are "NO", then may proceed with Cephalosporin use.   Lisbeth Ply [Fesoterodine Fumarate Er] Other (See Comments)    Reaction not recalled  . Voltaren [Diclofenac Sodium] Rash    Objective: Physical Exam  General: Well developed, nourished, no acute distress, awake, alert and oriented x 2  Vascular: Dorsalis pedis artery 1/4 bilateral, Posterior tibial artery 0/4 bilateral, skin temperature warm to warm proximal to distal bilateral lower  extremities, mild varicosities, pedal hair present bilateral.  More increased swelling on the left greater than the right leg chronic in nature.  Neurological: Gross sensation present via light touch bilateral.   Dermatological: Skin is warm, dry, and supple bilateral, Nails 1-10 are tender, long, thick, and discolored with mild subungal debris with mild ingrowing without infection at Left>Right medial hallux, no webspace macerations present bilateral, no open lesions present bilateral, no callus/corns/hyperkeratotic tissue present bilateral. No signs of infection bilateral.  Musculoskeletal: Asymptomatic hallux malleous L>R boney deformities noted bilateral. Muscular strength 4/5 without painon range of motion.+ History of arthritis. No pain with calf compression bilateral.  Assessment and Plan:  Problem List Items Addressed This Visit    None    Visit Diagnoses    Nail dystrophy    -  Primary   Ingrown nail       Toe pain, bilateral          -Examined patient.  -Discussed treatment options for painful ingrowning minimally mycotic nails as previous. -Mechanically debrided and reduced nails with sterile nail nipper and dremel nail file without incident and remove the offending nail borders to patient's tolerance. -Recommend again Epsom salt soaks and corticosporin solution as needed may call for a refill when needed -Advised patient to use lidocaine pain cream to use to toes as needed -Advised open toed compression stockings and sandals to avoid rubbing at toes or causing pain or pressure around toes that could be increasing the issue with pain to ingrown toenail margins that are noninfected -Advised patient that she continues to have pain in her  nail margins may benefit from a nail avulsion procedure will further discuss this option at next office visit -Patient to return in 1.5 months for nail trim or sooner if symptoms worsen.  Landis Martins, DPM

## 2018-12-30 DIAGNOSIS — I10 Essential (primary) hypertension: Secondary | ICD-10-CM | POA: Diagnosis not present

## 2018-12-30 DIAGNOSIS — E785 Hyperlipidemia, unspecified: Secondary | ICD-10-CM | POA: Diagnosis not present

## 2018-12-30 DIAGNOSIS — C9000 Multiple myeloma not having achieved remission: Secondary | ICD-10-CM | POA: Diagnosis not present

## 2018-12-30 DIAGNOSIS — F0390 Unspecified dementia without behavioral disturbance: Secondary | ICD-10-CM | POA: Diagnosis not present

## 2019-01-10 DIAGNOSIS — R918 Other nonspecific abnormal finding of lung field: Secondary | ICD-10-CM | POA: Diagnosis not present

## 2019-01-10 DIAGNOSIS — R071 Chest pain on breathing: Secondary | ICD-10-CM | POA: Diagnosis not present

## 2019-01-10 DIAGNOSIS — S29011A Strain of muscle and tendon of front wall of thorax, initial encounter: Secondary | ICD-10-CM | POA: Diagnosis not present

## 2019-01-11 DIAGNOSIS — E785 Hyperlipidemia, unspecified: Secondary | ICD-10-CM | POA: Diagnosis not present

## 2019-01-11 DIAGNOSIS — E0590 Thyrotoxicosis, unspecified without thyrotoxic crisis or storm: Secondary | ICD-10-CM | POA: Diagnosis not present

## 2019-01-11 DIAGNOSIS — R5382 Chronic fatigue, unspecified: Secondary | ICD-10-CM | POA: Diagnosis not present

## 2019-01-11 DIAGNOSIS — G609 Hereditary and idiopathic neuropathy, unspecified: Secondary | ICD-10-CM | POA: Diagnosis not present

## 2019-01-13 DIAGNOSIS — F0390 Unspecified dementia without behavioral disturbance: Secondary | ICD-10-CM | POA: Diagnosis not present

## 2019-01-13 DIAGNOSIS — F419 Anxiety disorder, unspecified: Secondary | ICD-10-CM | POA: Diagnosis not present

## 2019-01-13 DIAGNOSIS — R0789 Other chest pain: Secondary | ICD-10-CM | POA: Diagnosis not present

## 2019-01-13 DIAGNOSIS — I2510 Atherosclerotic heart disease of native coronary artery without angina pectoris: Secondary | ICD-10-CM | POA: Diagnosis not present

## 2019-01-19 DIAGNOSIS — N390 Urinary tract infection, site not specified: Secondary | ICD-10-CM | POA: Diagnosis not present

## 2019-01-19 DIAGNOSIS — R350 Frequency of micturition: Secondary | ICD-10-CM | POA: Diagnosis not present

## 2019-01-24 DIAGNOSIS — R040 Epistaxis: Secondary | ICD-10-CM | POA: Diagnosis not present

## 2019-01-25 DIAGNOSIS — N390 Urinary tract infection, site not specified: Secondary | ICD-10-CM | POA: Diagnosis not present

## 2019-01-25 DIAGNOSIS — R300 Dysuria: Secondary | ICD-10-CM | POA: Diagnosis not present

## 2019-01-25 DIAGNOSIS — R52 Pain, unspecified: Secondary | ICD-10-CM | POA: Diagnosis not present

## 2019-01-27 DIAGNOSIS — C9000 Multiple myeloma not having achieved remission: Secondary | ICD-10-CM | POA: Diagnosis not present

## 2019-01-27 DIAGNOSIS — R5382 Chronic fatigue, unspecified: Secondary | ICD-10-CM | POA: Diagnosis not present

## 2019-01-27 DIAGNOSIS — E785 Hyperlipidemia, unspecified: Secondary | ICD-10-CM | POA: Diagnosis not present

## 2019-01-27 DIAGNOSIS — G609 Hereditary and idiopathic neuropathy, unspecified: Secondary | ICD-10-CM | POA: Diagnosis not present

## 2019-01-28 ENCOUNTER — Telehealth: Payer: Self-pay | Admitting: *Deleted

## 2019-01-28 DIAGNOSIS — C9000 Multiple myeloma not having achieved remission: Secondary | ICD-10-CM

## 2019-01-28 NOTE — Telephone Encounter (Addendum)
Received labs from [facility] that were drawn at [place]. MD states that [place] and IgG have slightly increased and would like to recheck and see her in 1-2 months. Called daughter, [name], but she was with a client and not able to talk. She will call back later to discuss. Currently, facility still not letting residents out and to return. Faxed MD note to facility. Will discuss return to office closer to that time. Daughter requesting copy of results to be emailed to her. Not able to scan and email patient information. Will mail copy to home. Sandria Senter [place], [place] [id number]

## 2019-01-31 ENCOUNTER — Telehealth: Payer: Self-pay | Admitting: Oncology

## 2019-01-31 NOTE — Telephone Encounter (Signed)
Called daughter per 8/21 sch message - per daughter she is unsure if patient can leave facility yet and does not want to schedule anything at the moment . She will call back to set something up when she is sure

## 2019-02-10 DIAGNOSIS — N390 Urinary tract infection, site not specified: Secondary | ICD-10-CM | POA: Diagnosis not present

## 2019-02-10 DIAGNOSIS — F0390 Unspecified dementia without behavioral disturbance: Secondary | ICD-10-CM | POA: Diagnosis not present

## 2019-02-10 DIAGNOSIS — N3281 Overactive bladder: Secondary | ICD-10-CM | POA: Diagnosis not present

## 2019-02-10 DIAGNOSIS — N3944 Nocturnal enuresis: Secondary | ICD-10-CM | POA: Diagnosis not present

## 2019-02-15 ENCOUNTER — Ambulatory Visit: Payer: PPO | Admitting: Sports Medicine

## 2019-02-15 ENCOUNTER — Encounter: Payer: Self-pay | Admitting: Sports Medicine

## 2019-02-15 ENCOUNTER — Other Ambulatory Visit: Payer: Self-pay

## 2019-02-15 DIAGNOSIS — M79674 Pain in right toe(s): Secondary | ICD-10-CM | POA: Diagnosis not present

## 2019-02-15 DIAGNOSIS — L603 Nail dystrophy: Secondary | ICD-10-CM | POA: Diagnosis not present

## 2019-02-15 DIAGNOSIS — L600 Ingrowing nail: Secondary | ICD-10-CM | POA: Diagnosis not present

## 2019-02-15 DIAGNOSIS — I739 Peripheral vascular disease, unspecified: Secondary | ICD-10-CM

## 2019-02-15 NOTE — Progress Notes (Signed)
Subjective: [name] is a 85 y.o. female patient seen today in office with complaint of mildly painful thickened and elongated toenails especially at right greater than left toes reports that there has been a constant sharp pain especially at the bottom of the right great toe has used her lidocaine pain cream which helps a little but can only do it once daily and reports that she has been using Epson salt and Corticosporin drops with no long-term relief at her ingrowing toenails reports that she has been struggling with swelling and has been having issues with her compression garments of which she has been wearing.  Patient is from [employer].  No other issues at this time.  Patient Active Problem List   Diagnosis Date Noted  . Left hip pain 06/30/2018  . Disc degeneration, lumbar 06/23/2018  . Left thyroid nodule 08/29/2017  . Chronic idiopathic constipation 08/18/2017  . Abdominal pain 07/31/2017  . Mesenteric ischemia ([place]) 05/04/2017  . Unsteadiness on feet   . Unstable angina pectoris ([place])   . Tinea pedis   . Spinal stenosis of lumbar region   . Somnolence   . Seasonal allergic rhinitis   . Restless legs syndrome   . Raynaud's syndrome   . Raynauds syndrome   . Radiculopathy   . Panic disorder   . Ovarian cyst, bilateral   . Osteopenia   . Myocardial infarction ([place])   . Muscle weakness (generalized)   . Mixed incontinence   . Mild chronic anemia   . Major depressive disorder   . Long term (current) use of aspirin   . Intermediate coronary syndrome ([place])   . Incontinent of urine   . IBS (irritable bowel syndrome)   . Hypercholesteremia   . Hx of CABG   . GERD (gastroesophageal reflux disease)   . Dementia ([place])   . Chest pain   . Atherosclerotic heart disease of native coronary artery without angina pectoris   . Arthritis   . Anxiety   . Allergic rhinitis   . Abnormal weight loss   . History of adenomatous polyp of colon 03/02/2017  . NSTEMI (non-ST  elevated myocardial infarction) ([place]) 02/02/2017  . SOB (shortness of breath) 09/02/2016  . Dizziness 09/02/2016  . Non-ST elevation (NSTEMI) myocardial infarction ([place]) 11/26/2015  . Polymyalgia rheumatica ([place]) 11/26/2015  . Hypokalemia 11/26/2015  . Mild persistent asthma 11/12/2015  . Coronary artery disease involving autologous artery coronary bypass graft with angina pectoris with documented spasm ([place]) 11/12/2015  . Multiple myeloma in remission ([place]) 11/12/2015  . Current use of beta blocker 11/12/2015  . Gastroesophageal reflux disease without esophagitis 11/12/2015  . Other allergic rhinitis 11/12/2015  . Weight loss, non-intentional 10/03/2015  . Asthma 09/24/2015  . Asthma in adult without complication 40/98/1191  . Seasonal allergic rhinitis due to pollen 09/24/2015  . Cervical radiculopathy 07/25/2015  . Chronic coronary artery disease 07/25/2015  . Coronary artery disease 07/25/2015  . Disturbance in sleep behavior 07/25/2015  . Elevated CK 07/25/2015  . Elevated creatine kinase level 07/25/2015  . Generalized osteoarthritis of hand 07/25/2015  . Other long term (current) drug therapy 07/25/2015  . Overactive bladder 07/25/2015  . Restless leg 07/25/2015  . Restless leg syndrome 07/25/2015  . Sleep disturbances 07/25/2015  . Idiopathic peripheral neuropathy 07/04/2015  . Lumbar radiculopathy, chronic 07/04/2015  . Mixed dementia ([place]) 07/04/2015  . Risk for falls 07/04/2015  . Sleep disturbance 07/04/2015  . Chest pain with moderate risk for cardiac etiology 06/13/2015  .  Right sciatic nerve pain 10/19/2014  . Abnormal thyroid blood test 08/25/2014  . Chronic fatigue 08/09/2014  . Multiple myeloma ([place]) 07/21/2013  . Depression 01/14/2013  . Generalized anxiety disorder 01/14/2013  . Gastroesophageal reflux disease 01/13/2013  . Urine test positive for microalbuminuria 12/08/2011  . Cyst of ovary 12/17/2010  . KNEE PAIN, BILATERAL 08/15/2010  . Abnormal gait  08/15/2010  . Atherosclerosis of coronary artery bypass graft 10/11/2009  . CHEST PAIN, NON-CARDIAC 10/11/2009  . HIP PAIN, RIGHT 08/13/2009  . GREATER TROCHANTERIC BURSITIS 08/13/2009  . LUMBOSACRAL SPONDYLOSIS WITHOUT MYELOPATHY 11/10/2008  . Osteoarthritis of lumbosacral spine without myelopathy 11/10/2008  . FOOT PAIN, BILATERAL 06/30/2008  . Hyperlipidemia 06/22/2008  . Hypertension 06/22/2008  . Raynaud's disease 06/22/2008  . BURSITIS, LEFT SHOULDER 06/22/2008    Current Outpatient Medications on File Prior to Visit  Medication Sig Dispense Refill  . acetaminophen (TYLENOL) 650 MG CR tablet Take 1,300 mg by mouth 2 (two) times daily.    . Alum Hydroxide-Mag Carbonate (GAVISCON EXTRA STRENGTH) 508-475 MG/10ML SUSP Take 15 mLs by mouth 4 (four) times daily as needed (for reflux).     . Artificial Tear Solution (SOOTHE XP) SOLN Place 1 drop into both eyes 2 (two) times daily.    Marland Kitchen aspirin EC 81 MG tablet Take 1 tablet (81 mg total) by mouth daily. 90 tablet 3  . baclofen 5 MG TABS Take 5 mg by mouth 3 (three) times daily as needed for muscle spasms. 30 each 0  . Biotin 5000 MCG SUBL Place 10,000 mcg under the tongue daily.    . Calcium Carb-Cholecalciferol (CALCIUM 600+D3) 600-200 MG-UNIT TABS Take 1 tablet by mouth 2 (two) times daily.    . carvedilol (COREG) 3.125 MG tablet Take 1 tablet (3.125 mg total) by mouth 2 (two) times daily with a meal. 60 tablet 3  . cholestyramine (QUESTRAN) 4 g packet Take 2 g by mouth every other day as needed (diarrhea).    . conjugated estrogens (PREMARIN) vaginal cream Place 0.5 g vaginally See admin instructions. Insert 0.5 g vaginally 2 times a week as needed/as directed    . cycloSPORINE (RESTASIS) 0.05 % ophthalmic emulsion Place 1 drop into both eyes 2 (two) times daily.     Marland Kitchen docusate sodium (COLACE) 100 MG capsule Take 100 mg by mouth See admin instructions. Take 100 mg by mouth once a day and an additional 100 mg as needed for constipation     . famotidine (PEPCID) 20 MG tablet Take 1 tablet (20 mg total) by mouth 2 (two) times daily. 60 tablet 3  . gabapentin (NEURONTIN) 100 MG capsule Take 100 mg by mouth at bedtime.    . hydrALAZINE (APRESOLINE) 10 MG tablet Take 1 tablet (10 mg total) by mouth daily at 3 pm. (Patient taking differently: Take 10 mg by mouth See admin instructions. Take 10 mg by mouth daily at 2:30 PM and an additional 10 mg two times a day as needed if Systolic reading is >161) 90 tablet 3  . hydrALAZINE (APRESOLINE) 25 MG tablet Take 1 tablet (25 mg total) by mouth 2 (two) times daily. 8 am and 10 pm (Patient taking differently: Take 25 mg by mouth 2 (two) times daily. ) 180 tablet 3  . Hyoscyamine Sulfate SL (LEVSIN/SL) 0.125 MG SUBL Take 1 tablet, dissolve under the tongue every 6 hours as needed for abdominal pain. (Patient taking differently: Place 0.125 mg under the tongue every 6 (six) hours as needed (for abdominal pain). )  20 each 1  . iron polysaccharides (NU-IRON) 150 MG capsule Take 150 mg by mouth 2 (two) times daily.    . isosorbide mononitrate (IMDUR) 60 MG 24 hr tablet Take 60 mg by mouth daily.    Marland Kitchen lactobacillus acidophilus (BACID) TABS tablet Take 1 tablet by mouth 2 (two) times daily.    Marland Kitchen levocetirizine (XYZAL) 5 MG tablet Take 1 tablet (5 mg total) by mouth every evening. (Patient taking differently: Take 5 mg by mouth at bedtime. ) 30 tablet 5  . lidocaine (XYLOCAINE) 4 % external solution Apply topically as needed. To toes for pain 50 mL 0  . LORazepam (ATIVAN) 0.5 MG tablet Take 1 tablet (0.5 mg total) by mouth See admin instructions. Take 0.5 mg by mouth at bedtime and 0.5 mg two times a day as needed for anxiety 10 tablet 0  . Melatonin 5 MG TABS Take 5 mg by mouth at bedtime.    . memantine (NAMENDA) 10 MG tablet Take 10 mg by mouth 2 (two) times daily.    . Menthol, Topical Analgesic, (BIOFREEZE) 4 % GEL Apply topically as needed.    . methylcellulose (CITRUCEL) oral powder Take 0.5-1  packets by mouth 2 (two) times daily as needed (for constipation).    . mirabegron ER (MYRBETRIQ) 50 MG TB24 tablet Take 25 mg by mouth daily.     . montelukast (SINGULAIR) 10 MG tablet Take 10 mg by mouth at bedtime.     . Multiple Vitamins-Minerals (CENTRUM SILVER 50+WOMEN) TABS Take 1 tablet by mouth daily.    . NEOMYCIN-POLYMYXIN-HYDROCORTISONE (CORTISPORIN) 1 % SOLN OTIC solution Apply 1-2 drops to both toes once daily until all used 10 mL 0  . nitroGLYCERIN (NITROSTAT) 0.4 MG SL tablet Place 1 tablet (0.4 mg total) under the tongue every 5 (five) minutes as needed for chest pain. Reported on 11/12/2015 25 tablet 2  . Peppermint Oil (IBGARD) 90 MG CPCR Take 2 capsules by mouth as directed. Per box instructions (Patient taking differently: Take 1 capsule by mouth 2 (two) times daily before a meal. ) 1 capsule 0  . polyethylene glycol (MIRALAX / GLYCOLAX) packet Take 17 g by mouth 2 (two) times daily. (Patient taking differently: Take 8.5-17 g by mouth See admin instructions. Mix and drink 17 grams two times a day and an additional 8.5-17 grams once daily, as needed for constipation) 1 each 0  . Propylene Glycol (SYSTANE BALANCE) 0.6 % SOLN Place 1 drop into both eyes 4 (four) times daily as needed.    . sertraline (ZOLOFT) 25 MG tablet Take 25 mg by mouth every morning.    . sertraline (ZOLOFT) 50 MG tablet Take 50 mg by mouth every evening.    . traMADol (ULTRAM) 50 MG tablet Take by mouth every 8 (eight) hours as needed.      No current facility-administered medications on file prior to visit.     Allergies  Allergen Reactions  . Butazolidin [Phenylbutazone] Swelling and Rash  . Cephalosporins Other (See Comments)    Site of swelling not recalled  . Codeine Nausea And Vomiting  . Levaquin [Levofloxacin] Shortness Of Breath  . Macrodantin Other (See Comments)    Double vision  . Morphine And Related Other (See Comments)    Hallucinations   . Trimethoprim Nausea Only  . Aleve  [Naproxen] Other (See Comments)    Was told by a MD to "never take this"  . Buprenorphine Other (See Comments)    "Allergic," per MAR  .  Cilostazol Other (See Comments)    Stomach upset  . Dexilant [Dexlansoprazole] Diarrhea  . Diclofenac Other (See Comments)    Documented on MAR  . Erythromycin Nausea And Vomiting    All "mycin" drugs  . Lactose Intolerance (Gi) Diarrhea  . Morphine Other (See Comments)    Reaction not recalled  . Simvastatin Other (See Comments)    Muscle aches   . Sulfa Antibiotics Other (See Comments)    "Allergic," per MAR  . Vesicare [Solifenacin] Other (See Comments)    Reaction not recalled by the patient- gets a lot of UTI(s)  . Clindamycin/Lincomycin Other (See Comments)    Upsets the stomach  . Doxycycline Diarrhea  . Food Other (See Comments)    No spicy foods or black pepper, raw onions - causes gerd  . Ibuprofen Hives and Rash    Was told by a MD to "never take this"  . Lincomycin Hcl Nausea Only and Other (See Comments)    Upsets the stomach  . Penicillins Rash    Has patient had a PCN reaction causing immediate rash, facial/tongue/throat swelling, SOB or lightheadedness with hypotension: Yes Has patient had a PCN reaction causing severe rash involving mucus membranes or skin necrosis: No Has patient had a PCN reaction that required hospitalization No Has patient had a PCN reaction occurring within the last 10 years: No If all of the above answers are "NO", then may proceed with Cephalosporin use.   Lisbeth Ply [Fesoterodine Fumarate Er] Other (See Comments)    Reaction not recalled  . Voltaren [Diclofenac Sodium] Rash    Objective: Physical Exam  General: Well developed, nourished, no acute distress, awake, alert and oriented x 2  Vascular: Dorsalis pedis artery 1/4 bilateral, Posterior tibial artery 0/4 bilateral, skin temperature warm to warm proximal to distal bilateral lower extremities, mild varicosities, pedal hair present bilateral.   More increased swelling on the left greater than the right leg chronic in nature.  Neurological: Gross sensation present via light touch bilateral.   Dermatological: Skin is warm, dry, and supple bilateral, Nails 1-10 are tender, long, thick, and discolored with mild subungal debris with mild ingrowing without infection at right greater than left medial hallux, no webspace macerations present bilateral, no open lesions present bilateral, no callus/corns/hyperkeratotic tissue present bilateral. No signs of infection bilateral.  Musculoskeletal: Asymptomatic hallux malleous L>R boney deformities noted bilateral. Muscular strength 4/5 without painon range of motion.+ History of arthritis. No pain with calf compression bilateral.  Assessment and Plan:  Problem List Items Addressed This Visit    None    Visit Diagnoses    Toe pain, right    -  Primary   Nail dystrophy       Ingrown nail       PVD (peripheral vascular disease) ([place])          -Examined patient.  -Discussed treatment options for painful ingrowning minimally mycotic nails as previous and new pain at the plantar aspect of the right hallux. -Mechanically debrided and reduced nails with sterile nail nipper and dremel nail file without incident and remove the offending nail borders to patient's tolerance. -Dispensed toe foam for patient to use on first toes bilateral -Recommend again Epsom salt soaks and corticosporin solution as needed -Advised patient to use lidocaine pain cream to use to toes as needed -Advised open toed compression stockings and sandals to avoid rubbing at toes like before -Advised patient that she continues to have pain in her nail margins may  benefit from a nail avulsion procedure will do procedure at next visit if area still continues to be painful advised patient that pain at the plantar aspect of the right great toe if not relieved after doing ingrown procedure may benefit from updated vascular testing versus  nerve testing to rule out any other underlying causes of pain at the distal tuft of the right great toe -Patient to return in 2 to 3 weeks for nail procedure on right great toe or sooner if symptoms worsen.  Landis Martins, DPM

## 2019-02-16 ENCOUNTER — Ambulatory Visit: Payer: PPO | Admitting: Physician Assistant

## 2019-02-17 DIAGNOSIS — I7300 Raynaud's syndrome without gangrene: Secondary | ICD-10-CM | POA: Diagnosis not present

## 2019-02-17 DIAGNOSIS — M2550 Pain in unspecified joint: Secondary | ICD-10-CM | POA: Diagnosis not present

## 2019-02-17 DIAGNOSIS — M150 Primary generalized (osteo)arthritis: Secondary | ICD-10-CM | POA: Diagnosis not present

## 2019-02-17 DIAGNOSIS — M353 Polymyalgia rheumatica: Secondary | ICD-10-CM | POA: Diagnosis not present

## 2019-03-04 ENCOUNTER — Telehealth: Payer: Self-pay

## 2019-03-04 NOTE — Telephone Encounter (Signed)
Patient's daughter called with numerous questions regards to her mother's toes in the recent care she has been receiving at our office.  Advised her that Dr. Cannon Kettle has been doing palliative care up into this point to help relieve her mother's ingrown and thickened painful nails.  I did discuss with her that Dr. Cannon Kettle recommended if she was continuing to have pain in her toe that she have a nail avulsion procedure, and possibly vascular studies.  Daughter was agreeable with this, we discussed the procedure, and the aftercare for the procedure.  I did advise her that the procedure could be done in our office when she comes next week if all parties are agreeable that this is the plan of care.

## 2019-03-04 NOTE — Telephone Encounter (Signed)
Thank you :)

## 2019-03-08 ENCOUNTER — Ambulatory Visit: Payer: PPO | Admitting: Sports Medicine

## 2019-03-08 ENCOUNTER — Encounter: Payer: Self-pay | Admitting: Sports Medicine

## 2019-03-08 ENCOUNTER — Other Ambulatory Visit: Payer: Self-pay

## 2019-03-08 DIAGNOSIS — L600 Ingrowing nail: Secondary | ICD-10-CM | POA: Diagnosis not present

## 2019-03-08 DIAGNOSIS — M79675 Pain in left toe(s): Secondary | ICD-10-CM

## 2019-03-08 NOTE — Patient Instructions (Signed)

## 2019-03-08 NOTE — Progress Notes (Signed)
Subjective: [name] is a 85 y.o. female patient seen today in office with complaint of mildly painful thickened and elongated toenails with most pain today at left 1st toenail. Patient reports that she has been having more swelling and some bleeding at left 1st toenail.  No other issues at this time.  Patient Active Problem List   Diagnosis Date Noted  . Left hip pain 06/30/2018  . Disc degeneration, lumbar 06/23/2018  . Left thyroid nodule 08/29/2017  . Chronic idiopathic constipation 08/18/2017  . Abdominal pain 07/31/2017  . Mesenteric ischemia ([place]) 05/04/2017  . Unsteadiness on feet   . Unstable angina pectoris ([place])   . Tinea pedis   . Spinal stenosis of lumbar region   . Somnolence   . Seasonal allergic rhinitis   . Restless legs syndrome   . Raynaud's syndrome   . Raynauds syndrome   . Radiculopathy   . Panic disorder   . Ovarian cyst, bilateral   . Osteopenia   . Myocardial infarction ([place])   . Muscle weakness (generalized)   . Mixed incontinence   . Mild chronic anemia   . Major depressive disorder   . Long term (current) use of aspirin   . Intermediate coronary syndrome ([place])   . Incontinent of urine   . IBS (irritable bowel syndrome)   . Hypercholesteremia   . Hx of CABG   . GERD (gastroesophageal reflux disease)   . Dementia ([place])   . Chest pain   . Atherosclerotic heart disease of native coronary artery without angina pectoris   . Arthritis   . Anxiety   . Allergic rhinitis   . Abnormal weight loss   . History of adenomatous polyp of colon 03/02/2017  . NSTEMI (non-ST elevated myocardial infarction) ([place]) 02/02/2017  . SOB (shortness of breath) 09/02/2016  . Dizziness 09/02/2016  . Non-ST elevation (NSTEMI) myocardial infarction ([place]) 11/26/2015  . Polymyalgia rheumatica ([place]) 11/26/2015  . Hypokalemia 11/26/2015  . Mild persistent asthma 11/12/2015  . Coronary artery disease involving autologous artery coronary bypass graft with angina  pectoris with documented spasm ([place]) 11/12/2015  . Multiple myeloma in remission ([place]) 11/12/2015  . Current use of beta blocker 11/12/2015  . Gastroesophageal reflux disease without esophagitis 11/12/2015  . Other allergic rhinitis 11/12/2015  . Weight loss, non-intentional 10/03/2015  . Asthma 09/24/2015  . Asthma in adult without complication 91/47/8295  . Seasonal allergic rhinitis due to pollen 09/24/2015  . Cervical radiculopathy 07/25/2015  . Chronic coronary artery disease 07/25/2015  . Coronary artery disease 07/25/2015  . Disturbance in sleep behavior 07/25/2015  . Elevated CK 07/25/2015  . Elevated creatine kinase level 07/25/2015  . Generalized osteoarthritis of hand 07/25/2015  . Other long term (current) drug therapy 07/25/2015  . Overactive bladder 07/25/2015  . Restless leg 07/25/2015  . Restless leg syndrome 07/25/2015  . Sleep disturbances 07/25/2015  . Idiopathic peripheral neuropathy 07/04/2015  . Lumbar radiculopathy, chronic 07/04/2015  . Mixed dementia ([place]) 07/04/2015  . Risk for falls 07/04/2015  . Sleep disturbance 07/04/2015  . Chest pain with moderate risk for cardiac etiology 06/13/2015  . Right sciatic nerve pain 10/19/2014  . Abnormal thyroid blood test 08/25/2014  . Chronic fatigue 08/09/2014  . Multiple myeloma ([place]) 07/21/2013  . Depression 01/14/2013  . Generalized anxiety disorder 01/14/2013  . Gastroesophageal reflux disease 01/13/2013  . Urine test positive for microalbuminuria 12/08/2011  . Cyst of ovary 12/17/2010  . KNEE PAIN, BILATERAL 08/15/2010  . Abnormal gait 08/15/2010  .  Atherosclerosis of coronary artery bypass graft 10/11/2009  . CHEST PAIN, NON-CARDIAC 10/11/2009  . HIP PAIN, RIGHT 08/13/2009  . GREATER TROCHANTERIC BURSITIS 08/13/2009  . LUMBOSACRAL SPONDYLOSIS WITHOUT MYELOPATHY 11/10/2008  . Osteoarthritis of lumbosacral spine without myelopathy 11/10/2008  . FOOT PAIN, BILATERAL 06/30/2008  . Hyperlipidemia 06/22/2008   . Hypertension 06/22/2008  . Raynaud's disease 06/22/2008  . BURSITIS, LEFT SHOULDER 06/22/2008    Current Outpatient Medications on File Prior to Visit  Medication Sig Dispense Refill  . acetaminophen (TYLENOL) 650 MG CR tablet Take 1,300 mg by mouth 2 (two) times daily.    . Alum Hydroxide-Mag Carbonate (GAVISCON EXTRA STRENGTH) 508-475 MG/10ML SUSP Take 15 mLs by mouth 4 (four) times daily as needed (for reflux).     . Artificial Tear Solution (SOOTHE XP) SOLN Place 1 drop into both eyes 2 (two) times daily.    Marland Kitchen aspirin EC 81 MG tablet Take 1 tablet (81 mg total) by mouth daily. 90 tablet 3  . baclofen 5 MG TABS Take 5 mg by mouth 3 (three) times daily as needed for muscle spasms. 30 each 0  . Biotin 5000 MCG SUBL Place 10,000 mcg under the tongue daily.    . Calcium Carb-Cholecalciferol (CALCIUM 600+D3) 600-200 MG-UNIT TABS Take 1 tablet by mouth 2 (two) times daily.    . carvedilol (COREG) 3.125 MG tablet Take 1 tablet (3.125 mg total) by mouth 2 (two) times daily with a meal. 60 tablet 3  . cholestyramine (QUESTRAN) 4 g packet Take 2 g by mouth every other day as needed (diarrhea).    . conjugated estrogens (PREMARIN) vaginal cream Place 0.5 g vaginally See admin instructions. Insert 0.5 g vaginally 2 times a week as needed/as directed    . cycloSPORINE (RESTASIS) 0.05 % ophthalmic emulsion Place 1 drop into both eyes 2 (two) times daily.     Marland Kitchen docusate sodium (COLACE) 100 MG capsule Take 100 mg by mouth See admin instructions. Take 100 mg by mouth once a day and an additional 100 mg as needed for constipation    . famotidine (PEPCID) 20 MG tablet Take 1 tablet (20 mg total) by mouth 2 (two) times daily. 60 tablet 3  . gabapentin (NEURONTIN) 100 MG capsule Take 100 mg by mouth at bedtime.    . hydrALAZINE (APRESOLINE) 10 MG tablet Take 1 tablet (10 mg total) by mouth daily at 3 pm. (Patient taking differently: Take 10 mg by mouth See admin instructions. Take 10 mg by mouth daily at 2:30  PM and an additional 10 mg two times a day as needed if Systolic reading is >195) 90 tablet 3  . hydrALAZINE (APRESOLINE) 25 MG tablet Take 1 tablet (25 mg total) by mouth 2 (two) times daily. 8 am and 10 pm (Patient taking differently: Take 25 mg by mouth 2 (two) times daily. ) 180 tablet 3  . Hyoscyamine Sulfate SL (LEVSIN/SL) 0.125 MG SUBL Take 1 tablet, dissolve under the tongue every 6 hours as needed for abdominal pain. (Patient taking differently: Place 0.125 mg under the tongue every 6 (six) hours as needed (for abdominal pain). ) 20 each 1  . iron polysaccharides (NU-IRON) 150 MG capsule Take 150 mg by mouth 2 (two) times daily.    . isosorbide mononitrate (IMDUR) 60 MG 24 hr tablet Take 60 mg by mouth daily.    Marland Kitchen lactobacillus acidophilus (BACID) TABS tablet Take 1 tablet by mouth 2 (two) times daily.    Marland Kitchen levocetirizine (XYZAL) 5 MG tablet Take  1 tablet (5 mg total) by mouth every evening. (Patient taking differently: Take 5 mg by mouth at bedtime. ) 30 tablet 5  . lidocaine (XYLOCAINE) 4 % external solution Apply topically as needed. To toes for pain 50 mL 0  . LORazepam (ATIVAN) 0.5 MG tablet Take 1 tablet (0.5 mg total) by mouth See admin instructions. Take 0.5 mg by mouth at bedtime and 0.5 mg two times a day as needed for anxiety 10 tablet 0  . Melatonin 5 MG TABS Take 5 mg by mouth at bedtime.    . memantine (NAMENDA) 10 MG tablet Take 10 mg by mouth 2 (two) times daily.    . Menthol, Topical Analgesic, (BIOFREEZE) 4 % GEL Apply topically as needed.    . methylcellulose (CITRUCEL) oral powder Take 0.5-1 packets by mouth 2 (two) times daily as needed (for constipation).    . mirabegron ER (MYRBETRIQ) 50 MG TB24 tablet Take 25 mg by mouth daily.     . montelukast (SINGULAIR) 10 MG tablet Take 10 mg by mouth at bedtime.     Marland Kitchen MONUROL 3 g PACK     . Multiple Vitamins-Minerals (CENTRUM SILVER 50+WOMEN) TABS Take 1 tablet by mouth daily.    . NEOMYCIN-POLYMYXIN-HYDROCORTISONE  (CORTISPORIN) 1 % SOLN OTIC solution Apply 1-2 drops to both toes once daily until all used 10 mL 0  . nitroGLYCERIN (NITROSTAT) 0.4 MG SL tablet Place 1 tablet (0.4 mg total) under the tongue every 5 (five) minutes as needed for chest pain. Reported on 11/12/2015 25 tablet 2  . Peppermint Oil (IBGARD) 90 MG CPCR Take 2 capsules by mouth as directed. Per box instructions (Patient taking differently: Take 1 capsule by mouth 2 (two) times daily before a meal. ) 1 capsule 0  . polyethylene glycol (MIRALAX / GLYCOLAX) packet Take 17 g by mouth 2 (two) times daily. (Patient taking differently: Take 8.5-17 g by mouth See admin instructions. Mix and drink 17 grams two times a day and an additional 8.5-17 grams once daily, as needed for constipation) 1 each 0  . predniSONE (DELTASONE) 5 MG tablet     . Propylene Glycol (SYSTANE BALANCE) 0.6 % SOLN Place 1 drop into both eyes 4 (four) times daily as needed.    . sertraline (ZOLOFT) 25 MG tablet Take 25 mg by mouth every morning.    . sertraline (ZOLOFT) 50 MG tablet Take 50 mg by mouth every evening.    . traMADol (ULTRAM) 50 MG tablet Take by mouth every 8 (eight) hours as needed.      No current facility-administered medications on file prior to visit.     Allergies  Allergen Reactions  . Butazolidin [Phenylbutazone] Swelling and Rash  . Cephalosporins Other (See Comments)    Site of swelling not recalled  . Codeine Nausea And Vomiting  . Levaquin [Levofloxacin] Shortness Of Breath  . Macrodantin Other (See Comments)    Double vision  . Morphine And Related Other (See Comments)    Hallucinations   . Trimethoprim Nausea Only  . Aleve [Naproxen] Other (See Comments)    Was told by a MD to "never take this"  . Buprenorphine Other (See Comments)    "Allergic," per MAR  . Cilostazol Other (See Comments)    Stomach upset  . Dexilant [Dexlansoprazole] Diarrhea  . Diclofenac Other (See Comments)    Documented on MAR  . Erythromycin Nausea And  Vomiting    All "mycin" drugs  . Lactose Intolerance (Gi) Diarrhea  .  Morphine Other (See Comments)    Reaction not recalled  . Simvastatin Other (See Comments)    Muscle aches   . Sulfa Antibiotics Other (See Comments)    "Allergic," per MAR  . Vesicare [Solifenacin] Other (See Comments)    Reaction not recalled by the patient- gets a lot of UTI(s)  . Clindamycin/Lincomycin Other (See Comments)    Upsets the stomach  . Doxycycline Diarrhea  . Food Other (See Comments)    No spicy foods or black pepper, raw onions - causes gerd  . Ibuprofen Hives and Rash    Was told by a MD to "never take this"  . Lincomycin Hcl Nausea Only and Other (See Comments)    Upsets the stomach  . Penicillins Rash    Has patient had a PCN reaction causing immediate rash, facial/tongue/throat swelling, SOB or lightheadedness with hypotension: Yes Has patient had a PCN reaction causing severe rash involving mucus membranes or skin necrosis: No Has patient had a PCN reaction that required hospitalization No Has patient had a PCN reaction occurring within the last 10 years: No If all of the above answers are "NO", then may proceed with Cephalosporin use.   Lisbeth Ply [Fesoterodine Fumarate Er] Other (See Comments)    Reaction not recalled  . Voltaren [Diclofenac Sodium] Rash    Objective: Physical Exam  General: Well developed, nourished, no acute distress, awake, alert and oriented x 2  Vascular: Dorsalis pedis artery 1/4 bilateral, Posterior tibial artery 0/4 bilateral, skin temperature warm to warm proximal to distal bilateral lower extremities, mild varicosities, pedal hair present bilateral.  More increased swelling on the left greater than the right leg chronic in nature.  Neurological: Gross sensation present via light touch bilateral.   Dermatological: Skin is warm, dry, and supple bilateral, Nails 1-10 are tender, long, thick, and discolored with mild subungal debris with mild ingrowing without  infection at left greater than right medial hallux, no webspace macerations present bilateral, no open lesions present bilateral, no callus/corns/hyperkeratotic tissue present bilateral. No signs of infection bilateral.  Musculoskeletal: Asymptomatic hallux malleous L>R boney deformities noted bilateral. Muscular strength 4/5 without painon range of motion.+ History of arthritis. No pain with calf compression bilateral.  Assessment and Plan:  Problem List Items Addressed This Visit    None    Visit Diagnoses    Ingrown nail    -  Primary   Toe pain, left         -Examined patient.  -Re-Discussed treatment options for painful ingrowning minimally mycotic nails as previous -Discussed treatment alternatives and plan of care; Explained permanent/temporary nail avulsion and post procedure course to patient. Patient opt for PNA left hallux medial margin - After a verbal consent, injected 3 ml of a 50:50 mixture of 2% plain  lidocaine and 0.5% plain marcaine in a normal hallux block fashion. Next, a  betadine prep was performed. Anesthesia was tested and found to be appropriate.  The offending Left hallux medial nail border was then incised from the hyponychium to the epinychium. The offending nail border was removed and cleared from the field. The area was curretted for any remaining nail or spicules. Phenol application performed and the area was then flushed with alcohol and dressed with antibiotic cream and a dry sterile dressing. -Patient was instructed to leave the dressing intact for today and begin soaking  in a weak solution of betadine and water tomorrow. Patient was instructed to  soak for 15 minutes each day and apply  neosporin and a gauze or bandaid dressing each day. -Patient was instructed to monitor the toe for signs of infection and return to office if toe becomes red, hot or swollen. -Dispensed post op shoe for left foot  -Return to office 2 weeks for nail check.     Landis Martins, DPM

## 2019-03-09 ENCOUNTER — Telehealth: Payer: Self-pay | Admitting: *Deleted

## 2019-03-09 ENCOUNTER — Telehealth: Payer: Self-pay | Admitting: Sports Medicine

## 2019-03-09 NOTE — Telephone Encounter (Signed)
I spoke with [facility], and informed the soaks should be once daily and I would fax the orders. Faxed to [phone].

## 2019-03-09 NOTE — Telephone Encounter (Signed)
Patients daughter called regarding her mother's Sx dressing instructions. Stated the verbal instructions and written instructions conflict with each other. The Assisted living nurse Nelida Meuse) would like to receive a fax with the proper dressing orders.   Nurse station# [phone] Main# [phone] Daughter's # [phone]  Thank you LM

## 2019-03-09 NOTE — Telephone Encounter (Signed)
I informed pt's dtr, [name] I wanted to let her know I would be faxing orders to pt's assisted living nurse.

## 2019-03-09 NOTE — Telephone Encounter (Signed)
Patient called requesting appointment w/Dr. Benay Spice. Facility is now allowing patients to leave and return as long as it is MD appointments. Confirmed w/daughter [name] that it is OK to schedule the appointment. Appointment scheduled w/patient as requested and she will notify the nurse on her floor of the appointment. Called facility and provided the appointment information to [facility] for transportation arrangement.

## 2019-03-15 ENCOUNTER — Telehealth: Payer: Self-pay | Admitting: *Deleted

## 2019-03-15 NOTE — Telephone Encounter (Signed)
I called pt states she had a procedure on the left big toe and wanted to know about exercise, walking with her walker 15 - 20 minutes. I told pt that would be good for her to exercise, with the stability of the walker and make sure the shoes were wide enough and comfortable. I reminded pt of her call yesterday and instructed her to apply the bandaid to the left 1st toe, then the toeless stocking and then a light cotton sock and the bandaid and with the cotton sock over the open toed stockings would give some padded protection to the procedure toe. Pt state understanding.

## 2019-03-15 NOTE — Telephone Encounter (Signed)
Pt called asked how to layer the stocking, because has to wear the hose all day long, and feels the left 1st toe needs protecting from the sock or stocking and has toeless stocking, and okayed to call on 03/15/2019.

## 2019-03-17 ENCOUNTER — Telehealth: Payer: Self-pay | Admitting: *Deleted

## 2019-03-17 NOTE — Telephone Encounter (Signed)
I spoke with [name] and she would like someone to [facility] nurse mentor - Kearney Hard [phone], or Elmo Putt, NP that reports to [facility].

## 2019-03-17 NOTE — Telephone Encounter (Signed)
Entered in error

## 2019-03-17 NOTE — Telephone Encounter (Signed)
SusanStanhopr daughter [phone] please call to discuss what futre appt on the 13th of Oct is for

## 2019-03-17 NOTE — Telephone Encounter (Signed)
Left message [place] - Kearney Hard to call with pt's right toenail procedure status and I would give the information to Dr. Cannon Kettle which would give her more information for pt's 03/22/2019.

## 2019-03-18 DIAGNOSIS — N390 Urinary tract infection, site not specified: Secondary | ICD-10-CM | POA: Diagnosis not present

## 2019-03-18 DIAGNOSIS — N3941 Urge incontinence: Secondary | ICD-10-CM | POA: Diagnosis not present

## 2019-03-18 DIAGNOSIS — R159 Full incontinence of feces: Secondary | ICD-10-CM | POA: Diagnosis not present

## 2019-03-18 DIAGNOSIS — R8290 Unspecified abnormal findings in urine: Secondary | ICD-10-CM | POA: Diagnosis not present

## 2019-03-18 DIAGNOSIS — N3281 Overactive bladder: Secondary | ICD-10-CM | POA: Diagnosis not present

## 2019-03-22 ENCOUNTER — Ambulatory Visit: Payer: PPO

## 2019-03-22 ENCOUNTER — Other Ambulatory Visit: Payer: Self-pay

## 2019-03-22 DIAGNOSIS — L600 Ingrowing nail: Secondary | ICD-10-CM

## 2019-03-22 NOTE — Progress Notes (Signed)
Patient is here today for a follow up appt, recent procedure performed on 9.29.20, removal of ingrown toenail. She states that the area is slightly sore but she continues to soak and bandage her toe daily.   No redness, no erythema, no swelling, no drainage, no other s/s of infection. The area is scabbed over and healing well at this time.    Discussed s/s of infection, verbal and written instructions were given. She is to follow up with any acute symptom changes

## 2019-03-24 ENCOUNTER — Telehealth: Payer: Self-pay | Admitting: *Deleted

## 2019-03-24 ENCOUNTER — Ambulatory Visit: Payer: PPO | Admitting: Family Medicine

## 2019-03-24 NOTE — Telephone Encounter (Signed)
Left message to call to go over the LOV and scheduling the next toenail procedure.

## 2019-03-24 NOTE — Telephone Encounter (Signed)
Pt's dtr, [name] called to discuss pt's last office visit and to discuss possibility of scheduling for the other toenail to be fixed.

## 2019-03-29 ENCOUNTER — Other Ambulatory Visit: Payer: Self-pay

## 2019-03-29 ENCOUNTER — Ambulatory Visit: Payer: PPO | Admitting: Internal Medicine

## 2019-03-29 ENCOUNTER — Encounter: Payer: Self-pay | Admitting: Internal Medicine

## 2019-03-29 VITALS — BP 100/48 | HR 80 | Temp 97.9°F | Ht 63.0 in | Wt 98.0 lb

## 2019-03-29 DIAGNOSIS — K582 Mixed irritable bowel syndrome: Secondary | ICD-10-CM | POA: Diagnosis not present

## 2019-03-29 DIAGNOSIS — K219 Gastro-esophageal reflux disease without esophagitis: Secondary | ICD-10-CM | POA: Diagnosis not present

## 2019-03-29 NOTE — Progress Notes (Signed)
   Subjective:    Patient ID: [name], female    DOB: 10-21-1931, 85 y.o.   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  She was last seen on 02/23/2018.  She is here alone today.  She reports on the whole she is doing fairly well.  She still deals with her irritable bowel symptoms most days.  She has an issue in the past with constipation and diarrhea but most recently stools have been soft usually after lunch.  She has not had diarrhea" a long time.  She is using a capful of MiraLAX daily.  She is not using cholestyramine.  No blood in her stool or melena.  Her reflux has been well controlled though she has gone back on Pepcid twice daily.  She will occasionally use Gaviscon for minor heartburn after eating.  She reports that the food at [facility] contains a lot of pepper and this worsens her reflux.  No dysphagia or odynophagia.    She continues to remain concerned about her memory though she reports her family and friends tell her her memory is doing very well.  She follows with urology Dr. Amalia Hailey and has been told drink no more than 40 ounces of liquid per day and set her alarm at 3 AM to empty her bladder.  She does continue to have issues with urinary incontinence and periodic UTI.   Review of Systems As per HPI, otherwise negative  Current Medications, Allergies, Past Medical History, Past Surgical History, Family History and Social History were reviewed in [employer] record.     Objective:   Physical Exam BP (!) 100/48   Pulse 80   Temp 97.9 F (36.6 C)   Ht _0  (1.6 m)   Wt 98 lb (44.5 kg)   BMI 17.36 kg/m  Gen: awake, alert, NAD HEENT: anicteric CV: RRR, no mrg Pulm: CTA b/l Abd: soft, NT/ND,  +BS throughout Ext: no c/c/e Neuro: nonfocal      Assessment & Plan:  85 yo female with PMH of irritable bowel syndrome, possible median arcuate ligament syndrome suggested by prior CT angiography, history of diarrhea worsened by PPI, mixed dementia, history of multiple myeloma, hypertension who is here for follow-up.  1.  IBS --previous alternating diarrhea and constipation.  Bowel movements have been pretty regular recently.  Continue MiraLAX 17 g a day.  She has not needed Questran and I told her that she could stop using IBgard as she really did not find it very helpful.  2.  GERD/indigestion --prior PPI intolerance and PPI worsening loose stool.  Continue famotidine 20 mg twice a day.  Gaviscon also as needed for indigestion  3.  Question of prior median arcuate ligament syndrome --no clinical symptoms of significant mesenteric arterial compromise  Follow-up as needed 25 minutes spent with the patient today. Greater than 50% was spent in counseling and coordination of care with the patient

## 2019-03-29 NOTE — Patient Instructions (Signed)
Continue twice daily Pepcid.  Continue Miralax 17 grams daily.  Please purchase the following medications over the counter and take as directed: Gaviscon-Use as needed  If you are age 83 or older, your body mass index should be between 23-30. Your Body mass index is 17.36 kg/m. If this is out of the aforementioned range listed, please consider follow up with your Primary Care Provider.  If you are age 1 or younger, your body mass index should be between 19-25. Your Body mass index is 17.36 kg/m. If this is out of the aformentioned range listed, please consider follow up with your Primary Care Provider.

## 2019-04-06 ENCOUNTER — Ambulatory Visit: Payer: PPO | Admitting: Cardiology

## 2019-04-08 DIAGNOSIS — N399 Disorder of urinary system, unspecified: Secondary | ICD-10-CM | POA: Diagnosis not present

## 2019-04-08 DIAGNOSIS — M5430 Sciatica, unspecified side: Secondary | ICD-10-CM | POA: Diagnosis not present

## 2019-04-11 ENCOUNTER — Telehealth: Payer: Self-pay | Admitting: *Deleted

## 2019-04-11 NOTE — Telephone Encounter (Signed)
I called pt and asked if I could help her. Pt states at her last visit the nurse said the toe is healed. Pt states she thought it would take longer to heal and she didn't know what to do. I asked pt if she had redness, drainage or and pt denied. Pt states she has some pain in the two 2nd toes they are very long and may hit the end of the shoe. Pt asked if she would have the procedure on those at her next visit and I told pt that she would be evaluated for that at her appt.

## 2019-04-11 NOTE — Telephone Encounter (Signed)
Pt states she doesn't understand what she is to do, the instructions.

## 2019-04-14 ENCOUNTER — Other Ambulatory Visit: Payer: Self-pay

## 2019-04-14 ENCOUNTER — Inpatient Hospital Stay: Payer: PPO | Attending: Oncology | Admitting: Oncology

## 2019-04-14 ENCOUNTER — Inpatient Hospital Stay: Payer: PPO

## 2019-04-14 VITALS — BP 128/59 | HR 73 | Temp 98.5°F | Resp 16 | Ht 63.0 in | Wt 100.6 lb

## 2019-04-14 DIAGNOSIS — M8580 Other specified disorders of bone density and structure, unspecified site: Secondary | ICD-10-CM | POA: Diagnosis not present

## 2019-04-14 DIAGNOSIS — M79604 Pain in right leg: Secondary | ICD-10-CM | POA: Diagnosis not present

## 2019-04-14 DIAGNOSIS — C9000 Multiple myeloma not having achieved remission: Secondary | ICD-10-CM | POA: Insufficient documentation

## 2019-04-14 DIAGNOSIS — R159 Full incontinence of feces: Secondary | ICD-10-CM | POA: Diagnosis not present

## 2019-04-14 DIAGNOSIS — Z23 Encounter for immunization: Secondary | ICD-10-CM | POA: Diagnosis not present

## 2019-04-14 DIAGNOSIS — M545 Low back pain: Secondary | ICD-10-CM | POA: Diagnosis not present

## 2019-04-14 DIAGNOSIS — F0390 Unspecified dementia without behavioral disturbance: Secondary | ICD-10-CM | POA: Diagnosis not present

## 2019-04-14 DIAGNOSIS — N3941 Urge incontinence: Secondary | ICD-10-CM | POA: Diagnosis not present

## 2019-04-14 DIAGNOSIS — N3281 Overactive bladder: Secondary | ICD-10-CM | POA: Diagnosis not present

## 2019-04-14 DIAGNOSIS — R3915 Urgency of urination: Secondary | ICD-10-CM | POA: Diagnosis not present

## 2019-04-14 LAB — CBC WITH DIFFERENTIAL (CANCER CENTER ONLY)
Abs Immature Granulocytes: 0.01 10*3/uL (ref 0.00–0.07)
Basophils Absolute: 0 10*3/uL (ref 0.0–0.1)
Basophils Relative: 1 %
Eosinophils Absolute: 0.1 10*3/uL (ref 0.0–0.5)
Eosinophils Relative: 3 %
HCT: 30.5 % — ABNORMAL LOW (ref 36.0–46.0)
Hemoglobin: 10.1 g/dL — ABNORMAL LOW (ref 12.0–15.0)
Immature Granulocytes: 0 %
Lymphocytes Relative: 23 %
Lymphs Abs: 1 10*3/uL (ref 0.7–4.0)
MCH: 32.9 pg (ref 26.0–34.0)
MCHC: 33.1 g/dL (ref 30.0–36.0)
MCV: 99.3 fL (ref 80.0–100.0)
Monocytes Absolute: 0.7 10*3/uL (ref 0.1–1.0)
Monocytes Relative: 15 %
Neutro Abs: 2.6 10*3/uL (ref 1.7–7.7)
Neutrophils Relative %: 58 %
Platelet Count: 303 10*3/uL (ref 150–400)
RBC: 3.07 MIL/uL — ABNORMAL LOW (ref 3.87–5.11)
RDW: 13.4 % (ref 11.5–15.5)
WBC Count: 4.4 10*3/uL (ref 4.0–10.5)
nRBC: 0 % (ref 0.0–0.2)

## 2019-04-14 LAB — CMP (CANCER CENTER ONLY)
ALT: 18 U/L (ref 0–44)
AST: 32 U/L (ref 15–41)
Albumin: 3 g/dL — ABNORMAL LOW (ref 3.5–5.0)
Alkaline Phosphatase: 67 U/L (ref 38–126)
Anion gap: 9 (ref 5–15)
BUN: 18 mg/dL (ref 8–23)
CO2: 28 mmol/L (ref 22–32)
Calcium: 8.6 mg/dL — ABNORMAL LOW (ref 8.9–10.3)
Chloride: 97 mmol/L — ABNORMAL LOW (ref 98–111)
Creatinine: 0.77 mg/dL (ref 0.44–1.00)
GFR, Est AFR Am: >60 mL/min (ref >60)
GFR, Estimated: >60 mL/min (ref >60)
Glucose, Bld: 84 mg/dL (ref 70–99)
Potassium: 3.8 mmol/L (ref 3.5–5.1)
Sodium: 134 mmol/L — ABNORMAL LOW (ref 135–145)
Total Bilirubin: <0.2 mg/dL — ABNORMAL LOW (ref 0.3–1.2)
Total Protein: 6.9 g/dL (ref 6.5–8.1)

## 2019-04-14 MED ORDER — INFLUENZA VAC A&B SA ADJ QUAD 0.5 ML IM PRSY
0.5000 mL | PREFILLED_SYRINGE | Freq: Once | INTRAMUSCULAR | Status: AC
  Administered 2019-04-14: 0.5 mL via INTRAMUSCULAR

## 2019-04-14 MED ORDER — INFLUENZA VAC A&B SA ADJ QUAD 0.5 ML IM PRSY
PREFILLED_SYRINGE | INTRAMUSCULAR | Status: AC
  Filled 2019-04-14: qty 0.5

## 2019-04-14 NOTE — Progress Notes (Signed)
[facility] OFFICE PROGRESS NOTE   Diagnosis: Multiple myeloma  INTERVAL HISTORY:   [name] returns for a scheduled visit.  She was last seen at the cancer center in March.  She reports pain at the right lower back and right leg for the past 10 days.  She thinks the pain is related to "yoga".  She is being treated for a urinary tract infection.  She now lives in assisted living and reports being diagnosed with dementia.  Objective:  Vital signs in last 24 hours:  Blood pressure (!) 128/59, pulse 73, temperature 98.5 F (36.9 C), temperature source Temporal, resp. rate 16, height '5\' 3"'  (1.6 m), weight 100 lb 9.6 oz (45.6 kg), SpO2 99 %.  Resp: Decreased breath sounds in the right lower posterior chest, no respiratory distress Cardio: Regular rate and rhythm, neck veins are mildly distended GI: No hepatosplenomegaly Vascular: Pitting edema at the lower leg bilaterally Neuro: Motor exam appears intact in the legs and feet bilaterally flexion of the right hip Musculoskeletal: Tender at the right iliac spine    Lab Results:  Lab Results  Component Value Date   WBC 4.4 04/14/2019   HGB 10.1 (L) 04/14/2019   HCT 30.5 (L) 04/14/2019   MCV 99.3 04/14/2019   PLT 303 04/14/2019   NEUTROABS 2.6 04/14/2019    CMP  Lab Results  Component Value Date   NA 134 (L) 04/14/2019   K 3.8 04/14/2019   CL 97 (L) 04/14/2019   CO2 28 04/14/2019   GLUCOSE 84 04/14/2019   BUN 18 04/14/2019   CREATININE 0.77 04/14/2019   CALCIUM 8.6 (L) 04/14/2019   PROT 6.9 04/14/2019   ALBUMIN 3.0 (L) 04/14/2019   AST 32 04/14/2019   ALT 18 04/14/2019   ALKPHOS 67 04/14/2019   BILITOT <0.2 (L) 04/14/2019   GFRNONAA >60 04/14/2019   GFRAA >60 04/14/2019    Medications: I have reviewed the patient's current medications.   Assessment/Plan: 1. Indolent multiple myeloma, asymptomatic. No clinical evidence of disease progression. A metastatic bone survey in January 2019 was negative  for lytic lesions.  2. Mild anemia secondary to multiple myeloma 3. History of neutropenia-likely secondary to myeloma 4. History of coronary artery disease. 5. Osteopenia. 6. History of multiple urinary tract infections, followed by Dr. Diona Fanti. 7. "Raynaud" syndrome. 8. Chronic back pain. 9. History of hyponatremia. 10. Right foot surgery February 2014 11. Bilateral ovarian cysts-followed by GYN oncology 12. Elevated liver enzymes December 2018; liver enzymes normal on labs done 07/07/2017. 13. Left iliopsoas psoas tear with hemorrhage in the left iliacus muscle January 2020 14. Dementia  Disposition: [name] has a history of normal.  The hemoglobin is higher compared to when we saw her earlier this year.  I have a low suspicion for clinical progression myeloma.  We will follow-up on the myeloma panel from today. The back pain is like benign musculoskeletal condition.  She does not have a history of lytic bone lesions.  She has undergone multiple imaging studies over the past year without evidence of myeloma.  I do not recommend beginning systemic therapy for myeloma in the absence of clear progression.  She is now at an advanced age with dementia and multiple comorbid conditions.  [name] would like to continue follow-up at the cancer center.  She will return for an office lab visit in 6 months.  She is scheduled to see cardiology next week.  She will ask cardiology to address the leg edema.  [name] received an influenza vaccine today.  Betsy Coder, MD  04/14/2019  4:17 PM

## 2019-04-15 ENCOUNTER — Telehealth: Payer: Self-pay | Admitting: Oncology

## 2019-04-15 LAB — KAPPA/LAMBDA LIGHT CHAINS
Kappa free light chain: 22.3 mg/L — ABNORMAL HIGH (ref 3.3–19.4)
Kappa, lambda light chain ratio: 0.03 — ABNORMAL LOW (ref 0.26–1.65)
Lambda free light chains: 850 mg/L — ABNORMAL HIGH (ref 5.7–26.3)

## 2019-04-15 LAB — IGG: IgG (Immunoglobin G), Serum: 1580 mg/dL (ref 586–1602)

## 2019-04-15 NOTE — Telephone Encounter (Signed)
Scheduled per los. Called and left msg. Printout will be mailed out

## 2019-04-15 NOTE — Addendum Note (Signed)
Addended by: Tania Ade on: 04/15/2019 03:47 PM   Modules accepted: Orders

## 2019-04-16 LAB — PROTEIN ELECTROPHORESIS, SERUM
A/G Ratio: 0.9 (ref 0.7–1.7)
Albumin ELP: 3 g/dL (ref 2.9–4.4)
Alpha-1-Globulin: 0.3 g/dL (ref 0.0–0.4)
Alpha-2-Globulin: 0.8 g/dL (ref 0.4–1.0)
Beta Globulin: 2.1 g/dL — ABNORMAL HIGH (ref 0.7–1.3)
Gamma Globulin: 0.1 g/dL — ABNORMAL LOW (ref 0.4–1.8)
Globulin, Total: 3.3 g/dL (ref 2.2–3.9)
M-Spike, %: 1.4 g/dL — ABNORMAL HIGH
Total Protein ELP: 6.3 g/dL (ref 6.0–8.5)

## 2019-04-18 ENCOUNTER — Telehealth: Payer: Self-pay | Admitting: Cardiology

## 2019-04-18 ENCOUNTER — Telehealth: Payer: Self-pay | Admitting: *Deleted

## 2019-04-18 DIAGNOSIS — M25551 Pain in right hip: Secondary | ICD-10-CM | POA: Diagnosis not present

## 2019-04-18 DIAGNOSIS — M5431 Sciatica, right side: Secondary | ICD-10-CM | POA: Diagnosis not present

## 2019-04-18 DIAGNOSIS — M545 Low back pain: Secondary | ICD-10-CM | POA: Diagnosis not present

## 2019-04-18 DIAGNOSIS — M5430 Sciatica, unspecified side: Secondary | ICD-10-CM | POA: Diagnosis not present

## 2019-04-18 DIAGNOSIS — R262 Difficulty in walking, not elsewhere classified: Secondary | ICD-10-CM | POA: Diagnosis not present

## 2019-04-18 NOTE — Telephone Encounter (Signed)
Per Dr. Benay Spice, called pt regarding myeloma protein being better and to f/u as scheduled. Pt verbalized understanding.

## 2019-04-18 NOTE — Telephone Encounter (Signed)
-----   Message from Ladell Pier, MD sent at 04/16/2019  4:46 PM EST ----- Please call patient, Myeloma protein is better, f/u as scheduled

## 2019-04-18 NOTE — Telephone Encounter (Signed)
Will let Kerin Ransom PA know .Adonis Housekeeper

## 2019-04-18 NOTE — Telephone Encounter (Signed)
  Daughter [name] would like to be called during her mother's visit with Kerin Ransom on 04/21/19. The patient has dementia but the daughter is unable to come to [facility] for the visit that day. Please call her at [phone]

## 2019-04-21 ENCOUNTER — Ambulatory Visit: Payer: PPO | Admitting: Cardiology

## 2019-04-21 DIAGNOSIS — S32000A Wedge compression fracture of unspecified lumbar vertebra, initial encounter for closed fracture: Secondary | ICD-10-CM | POA: Diagnosis not present

## 2019-04-21 DIAGNOSIS — M5441 Lumbago with sciatica, right side: Secondary | ICD-10-CM | POA: Diagnosis not present

## 2019-04-21 DIAGNOSIS — N3281 Overactive bladder: Secondary | ICD-10-CM | POA: Diagnosis not present

## 2019-04-22 DIAGNOSIS — Z87440 Personal history of urinary (tract) infections: Secondary | ICD-10-CM | POA: Diagnosis not present

## 2019-04-22 DIAGNOSIS — N3941 Urge incontinence: Secondary | ICD-10-CM | POA: Diagnosis not present

## 2019-04-22 DIAGNOSIS — N3281 Overactive bladder: Secondary | ICD-10-CM | POA: Diagnosis not present

## 2019-04-22 DIAGNOSIS — R159 Full incontinence of feces: Secondary | ICD-10-CM | POA: Diagnosis not present

## 2019-04-23 DIAGNOSIS — R399 Unspecified symptoms and signs involving the genitourinary system: Secondary | ICD-10-CM | POA: Diagnosis not present

## 2019-04-25 ENCOUNTER — Telehealth: Payer: Self-pay | Admitting: *Deleted

## 2019-04-25 NOTE — Telephone Encounter (Signed)
Pt's dtr, [name] states pt has been complaining of right 1, and Both 2nd toenail pain and wanted to know what had to be done to decide if they needed a ingrown procedures. I told [name] I would make note on pt's 05/24/2019 appt to evaluate those toenails for possible procedures.

## 2019-04-25 NOTE — Telephone Encounter (Signed)
Pt's dtr, [name] states she needs to discuss pt's other toenails.

## 2019-04-27 ENCOUNTER — Telehealth: Payer: Self-pay | Admitting: Orthopedic Surgery

## 2019-04-27 DIAGNOSIS — M5431 Sciatica, right side: Secondary | ICD-10-CM | POA: Diagnosis not present

## 2019-04-27 DIAGNOSIS — M25551 Pain in right hip: Secondary | ICD-10-CM | POA: Diagnosis not present

## 2019-04-27 DIAGNOSIS — R262 Difficulty in walking, not elsewhere classified: Secondary | ICD-10-CM | POA: Diagnosis not present

## 2019-04-27 NOTE — Telephone Encounter (Signed)
Patient's daughter Otho Bellows asked if she can get a call after patient's appointment 05/02/2019. [name] said she has POA and will email the it to [place]. The number to contact [name] is [phone]

## 2019-04-28 DIAGNOSIS — R262 Difficulty in walking, not elsewhere classified: Secondary | ICD-10-CM | POA: Diagnosis not present

## 2019-04-28 DIAGNOSIS — M25551 Pain in right hip: Secondary | ICD-10-CM | POA: Diagnosis not present

## 2019-04-28 DIAGNOSIS — M5431 Sciatica, right side: Secondary | ICD-10-CM | POA: Diagnosis not present

## 2019-04-28 NOTE — Telephone Encounter (Signed)
Holding as reminder to have Dr Dean/Luke to call patients daughter after appointment. I have notified her daughter of this as well.

## 2019-04-28 NOTE — Progress Notes (Signed)
Cardiology Office Note   Date:  04/29/2019   ID:  Prezley, Qadir 1932-05-23, MRN [id number]  PCP:  [facility]  Cardiologist:   Minus Breeding, MD   Chief Complaint  Patient presents with  . Leg Swelling      History of Present Illness: [name] is a 85 y.o. female who presents for ongoing assessment and management of CAD with known history of PCI of a Cx with 90% stenosis,01/2017, CABG LIMA to LAD, SVG to RCA 08/2009, labile hypertension, non-cardiac chest pain, abdominal pain and significant anxiety.   Since I saw her [name] has been managed for recurrent urinary infections.  [name] is living at [facility].  [name] really does not get much activity and walks with a walker a little bit in the hallway and in her room.  [name] has had increased feet swelling.  Her weight is probably up a few pounds.  [name] is not having any new shortness of breath, PND or orthopnea.  [name] is not having any chest pressure, neck or arm discomfort.  [name] sleeps with her head slightly elevated chronically.  This has not changed.  They do have salt in the food.  [name] does not drink excessive water.  Past Medical History:  Diagnosis Date  . Anxiety   . Arthritis   . CAD (coronary artery disease)   . Chondrocostal junction syndrome   . Chronic fatigue   . Dementia ([place])    without Behavioral Disturbance  . GERD (gastroesophageal reflux disease)   . Hx of CABG   . Hyperlipidemia    GOAL LDL <70  . Hypertension   . IBS (irritable bowel syndrome)   . Jaundice    yellow jaundice 3rd grade  . Major depressive disorder   . Mild chronic anemia   . Mixed incontinence   . Multiple myeloma (H[facility])    not having achieved remission  . NSTEMI (non-ST elevated myocardial infarction) ([place]) 01/31/2017  . Osteopenia   . Ovarian cyst, bilateral    Rt complex  . Panic disorder   . Pulmonary nodules   . Radiculopathy   . Raynaud's syndrome   . Restless legs syndrome   . Spinal stenosis of  lumbar region   . Thyroid nodule     Past Surgical History:  Procedure Laterality Date  . BUNIONECTOMY WITH HAMMERTOE RECONSTRUCTION Right 08/04/2012   Procedure:  HAMMERTOE RECONSTRUCTION;  Surgeon: Colin Rhein, MD;  Location: [facility];  Service: Orthopedics;  Laterality: Right;  HAMMER TOE RECONSTRUCTION PROBABLE FLEXOR DIGITORUM LONGUS TO PROXIMAL PHALANX TRANSFER LATERALIZED   . CAPSULOTOMY Right 08/04/2012   Procedure: CAPSULOTOMY;  Surgeon: Colin Rhein, MD;  Location: [facility];  Service: Orthopedics;  Laterality: Right;  RIGHT 2ND TOE METATARSOPHALANGEAL JOINT DORSAL CAPSULOTOMY   . CATARACT EXTRACTION, BILATERAL     both cataracts  . COLONOSCOPY    . CORONARY ANGIOPLASTY WITH STENT PLACEMENT  2014   2 stents  . CORONARY ARTERY BYPASS GRAFT  2007   LIMA to LAD, SVG to RCA  non-ST segment MI 08/2009 (Dr. Tamala Julian)  . CORONARY STENT INTERVENTION N/A 02/02/2017   Procedure: CORONARY STENT INTERVENTION;  Surgeon: Belva Crome, MD;  Location: [facility];  Service: Cardiovascular;  Laterality: N/A;  Prox CFX  . DILATION AND CURETTAGE OF UTERUS    . LEFT HEART CATH AND CORS/GRAFTS ANGIOGRAPHY N/A 02/02/2017   Procedure: LEFT HEART CATH AND CORS/GRAFTS ANGIOGRAPHY;  Surgeon: Belva Crome, MD;  Location: [facility];  Service: Cardiovascular;  Laterality: N/A;  . LEFT HEART CATHETERIZATION WITH CORONARY/GRAFT ANGIOGRAM N/A 07/09/2012   Procedure: LEFT HEART CATHETERIZATION WITH Beatrix Fetters;  Surgeon: Sinclair Grooms, MD;  Location: [facility];  Service: Cardiovascular;  Laterality: N/A;  . TONSILLECTOMY AND ADENOIDECTOMY    . UPPER GASTROINTESTINAL ENDOSCOPY    . VISCERAL ANGIOGRAPHY N/A 04/15/2017   Procedure: VISCERAL ANGIOGRAPHY;  Surgeon: Waynetta Sandy, MD;  Location: [facility];  Service: Cardiovascular;  Laterality: N/A;     Current Outpatient Medications  Medication Sig Dispense Refill  .  acetaminophen (TYLENOL) 650 MG CR tablet Take 1,300 mg by mouth 2 (two) times daily.    . Alum Hydroxide-Mag Carbonate (GAVISCON EXTRA STRENGTH) 508-475 MG/10ML SUSP Take 15 mLs by mouth 4 (four) times daily as needed (for reflux).     . Artificial Tear Solution (SOOTHE XP) SOLN Place 1 drop into both eyes 2 (two) times daily.    Marland Kitchen aspirin EC 81 MG tablet Take 1 tablet (81 mg total) by mouth daily. 90 tablet 3  . baclofen 5 MG TABS Take 5 mg by mouth 3 (three) times daily as needed for muscle spasms. 30 each 0  . Biotin 5000 MCG SUBL Place 10,000 mcg under the tongue daily.    . Calcium Carb-Cholecalciferol (CALCIUM 600+D3) 600-200 MG-UNIT TABS Take 1 tablet by mouth 2 (two) times daily.    . carvedilol (COREG) 3.125 MG tablet Take 1 tablet (3.125 mg total) by mouth 2 (two) times daily with a meal. 60 tablet 3  . cholestyramine (QUESTRAN) 4 g packet Take 4 g by mouth as directed. 1/2 pack every 2 days    . conjugated estrogens (PREMARIN) vaginal cream Place 1 Applicatorful vaginally 2 (two) times a week.    . Cranberry (ELLURA) 200 MG CAPS Take 200 mg by mouth daily.    . cycloSPORINE (RESTASIS) 0.05 % ophthalmic emulsion Place 1 drop into both eyes 2 (two) times daily.     . D-Mannose POWD Take 1 Scoop by mouth daily.    Marland Kitchen docusate sodium (COLACE) 100 MG capsule Take 100 mg by mouth See admin instructions. Take 100 mg by mouth once a day and an additional 100 mg as needed for constipation    . famotidine (PEPCID) 20 MG tablet Take 1 tablet (20 mg total) by mouth 2 (two) times daily. 60 tablet 3  . gabapentin (NEURONTIN) 100 MG capsule Take 100 mg by mouth 2 (two) times daily.     . hydrALAZINE (APRESOLINE) 10 MG tablet Take 1 tablet (10 mg total) by mouth daily at 3 pm. (Patient taking differently: Take 10 mg by mouth See admin instructions. Take 10 mg by mouth daily at 2:30 PM and an additional 10 mg two times a day as needed if Systolic reading is >270) 90 tablet 3  . hydrALAZINE (APRESOLINE) 25  MG tablet Take 1 tablet (25 mg total) by mouth 2 (two) times daily. 8 am and 10 pm (Patient taking differently: Take 25 mg by mouth 2 (two) times daily. ) 180 tablet 3  . iron polysaccharides (NU-IRON) 150 MG capsule Take 150 mg by mouth 2 (two) times daily.    . isosorbide mononitrate (IMDUR) 60 MG 24 hr tablet Take 60 mg by mouth daily.    Marland Kitchen lactobacillus acidophilus (BACID) TABS tablet Take 1 tablet by mouth 2 (two) times daily.    Marland Kitchen levocetirizine (XYZAL) 5  MG tablet Take 1 tablet (5 mg total) by mouth every evening. (Patient taking differently: Take 5 mg by mouth at bedtime. ) 30 tablet 5  . lidocaine (XYLOCAINE) 4 % external solution Apply topically as needed. To toes for pain 50 mL 0  . LORazepam (ATIVAN) 0.5 MG tablet Take 1 tablet (0.5 mg total) by mouth See admin instructions. Take 0.5 mg by mouth at bedtime and 0.5 mg two times a day as needed for anxiety (Patient taking differently: Take 0.5 mg by mouth as directed. ) 10 tablet 0  . Melatonin 5 MG TABS Take 5 mg by mouth at bedtime.    . memantine (NAMENDA) 10 MG tablet Take 10 mg by mouth 2 (two) times daily.    . Menthol, Topical Analgesic, (BIOFREEZE) 4 % GEL Apply topically as needed.    . methylcellulose (CITRUCEL) oral powder Take 0.5-1 packets by mouth 2 (two) times daily as needed (for constipation).    . mirabegron ER (MYRBETRIQ) 50 MG TB24 tablet Take 25 mg by mouth daily.     . montelukast (SINGULAIR) 10 MG tablet Take 10 mg by mouth at bedtime.     Marland Kitchen MONUROL 3 g PACK     . Multiple Vitamins-Minerals (CENTRUM SILVER 50+WOMEN) TABS Take 1 tablet by mouth daily.    . NEOMYCIN-POLYMYXIN-HYDROCORTISONE (CORTISPORIN) 1 % SOLN OTIC solution Apply 1-2 drops to both toes once daily until all used 10 mL 0  . nitroGLYCERIN (NITROSTAT) 0.4 MG SL tablet Place 1 tablet (0.4 mg total) under the tongue every 5 (five) minutes as needed for chest pain. Reported on 11/12/2015 25 tablet 2  . Peppermint Oil (IBGARD) 90 MG CPCR Take 2 capsules by  mouth as directed. Per box instructions (Patient taking differently: Take 1 capsule by mouth 2 (two) times daily before a meal. ) 1 capsule 0  . polyethylene glycol (MIRALAX / GLYCOLAX) packet Take 17 g by mouth 2 (two) times daily. (Patient taking differently: Take 8.5-17 g by mouth See admin instructions. Mix and drink 17 grams two times a day and an additional 8.5-17 grams once daily, as needed for constipation) 1 each 0  . prednisoLONE acetate (PRED FORTE) 1 % ophthalmic suspension Place 1 drop into both eyes 2 (two) times daily as needed.    Marland Kitchen Propylene Glycol (SYSTANE BALANCE) 0.6 % SOLN Place 1 drop into both eyes 4 (four) times daily as needed.    . sertraline (ZOLOFT) 50 MG tablet Take 50 mg by mouth every evening.    . traMADol (ULTRAM) 50 MG tablet Take by mouth every 8 (eight) hours as needed.     . furosemide (LASIX) 20 MG tablet Take 1 tablet (20 mg total) by mouth daily. 10 tablet 0   No current facility-administered medications for this visit.     Allergies:   Butazolidin [phenylbutazone], Cephalosporins, Codeine, Levaquin [levofloxacin], Macrodantin, Morphine and related, Trimethoprim, Aleve [naproxen], Buprenorphine, Cilostazol, Dexilant [dexlansoprazole], Diclofenac, Erythromycin, Lactose intolerance (gi), Morphine, Simvastatin, Sulfa antibiotics, Vesicare [solifenacin], Clindamycin/lincomycin, Doxycycline, Food, Ibuprofen, Lincomycin hcl, Penicillins, Toviaz [fesoterodine fumarate er], and Voltaren [diclofenac sodium]    ROS:  Please see the history of present illness.   Otherwise, review of systems are positive for none.   All other systems are reviewed and negative.    PHYSICAL EXAM: VS:  BP (!) 144/64   Pulse 77   Temp 98 F (36.7 C)   Ht '5\' 3"'  (1.6 m)   Wt 100 lb (45.4 kg)   SpO2 96%  BMI 17.71 kg/m  , BMI Body mass index is 17.71 kg/m. GEN:  No distress, frail-appearing NECK:  No jugular venous distention at 90 degrees, waveform within normal limits, carotid  upstroke brisk and symmetric, no bruits, no thyromegaly LUNGS:  Clear to auscultation bilaterally BACK:  No CVA tenderness CHEST:  Unremarkable HEART:  S1 and S2 within normal limits, no S3, no S4, no clicks, no rubs, no murmurs ABD:  Positive bowel sounds normal in frequency in pitch, no bruits, no rebound, no guarding, unable to assess midline mass or bruit with the patient seated. EXT:  2 plus pulses throughout, moderate edema, no cyanosis no clubbing   EKG:  EKG is ordered today. The ekg ordered today demonstrates sinus rhythm, rate 77, axis within normal limits, intervals within normal limits, no acute ST-T wave changes.   Recent Labs: 04/14/2019: ALT 18; BUN 18; Creatinine 0.77; Hemoglobin 10.1; Platelet Count 303; Potassium 3.8; Sodium 134    Lipid Panel    Component Value Date/Time   CHOL 152 02/01/2017 0919   TRIG 41 02/01/2017 0919   HDL 75 02/01/2017 0919   CHOLHDL 2.0 02/01/2017 0919   VLDL 8 02/01/2017 0919   LDLCALC 69 02/01/2017 0919      Wt Readings from Last 3 Encounters:  04/29/19 100 lb (45.4 kg)  04/14/19 100 lb 9.6 oz (45.6 kg)  03/29/19 98 lb (44.5 kg)      Other studies Reviewed: Additional studies/ records that were reviewed today include: Labs. Review of the above records demonstrates:  Please see elsewhere in the note.     ASSESSMENT AND PLAN:  CAD:    [name] had no active symptoms.  No change in therapy.    Hypertension:    Slightly elevated.  [name] will continue the meds as listed as they seem to work well for her labile blood pressure.  Edema: [name] and her daughter and I talked at length about this.  I do not think [name] is in left-sided heart failure.  We can treat this conservatively.  [name] just started wearing compression stockings and I agree with this.  I am going to give her Lasix 20 mg daily for 3 days only.  [name] can keep her feet elevated and wear her stockings.  [name] is been to reduce her salt.  I will check a basic metabolic profile in  about 1 week.    Current medicines are reviewed at length with the patient today.  The patient does not have concerns regarding medicines.  The following changes have been made:  As above  Labs/ tests ordered today include:   Orders Placed This Encounter  Procedures  . Basic Metabolic Panel (BMET)  . EKG 12-Lead     Disposition:   FU with me in about 3 months.     Signed, Minus Breeding, MD  04/29/2019 1:04 PM    [facility]

## 2019-04-29 ENCOUNTER — Encounter: Payer: Self-pay | Admitting: Cardiology

## 2019-04-29 ENCOUNTER — Ambulatory Visit (INDEPENDENT_AMBULATORY_CARE_PROVIDER_SITE_OTHER): Payer: PPO | Admitting: Cardiology

## 2019-04-29 ENCOUNTER — Other Ambulatory Visit: Payer: Self-pay

## 2019-04-29 VITALS — BP 144/64 | HR 77 | Temp 98.0°F | Ht 63.0 in | Wt 100.0 lb

## 2019-04-29 DIAGNOSIS — I2510 Atherosclerotic heart disease of native coronary artery without angina pectoris: Secondary | ICD-10-CM

## 2019-04-29 DIAGNOSIS — I10 Essential (primary) hypertension: Secondary | ICD-10-CM

## 2019-04-29 MED ORDER — FUROSEMIDE 20 MG PO TABS: 20.0000 mg | ORAL_TABLET | Freq: Every day | ORAL | 0 refills | Status: DC

## 2019-04-29 NOTE — Patient Instructions (Signed)
Medication Instructions:  Take 20mg  lasix for 3 days  If you need a refill on your cardiac medications before your next appointment, please call your pharmacy.   Lab work: BMET in 1 week If you have labs (blood work) drawn today and your tests are completely normal, you will receive your results only by: [name] (if you have MyChart) OR A paper copy in the mail If you have any lab test that is abnormal or we need to change your treatment, we will call you to review the results.  Testing/Procedures: NONE  Follow-Up: At [facility], you and your health needs are our priority.  As part of our continuing mission to provide you with exceptional heart care, we have created designated Provider Care Teams.  These Care Teams include your primary Cardiologist (physician) and Advanced Practice Providers (APPs -  Physician Assistants and Nurse Practitioners) who all work together to provide you with the care you need, when you need it. You may see Minus Breeding, MD or one of the following Advanced Practice Providers on your designated Care Team:    Rosaria Ferries, PA-C  Jory Sims, DNP, ANP  Cadence Kathlen Mody, NP  Your physician wants you to follow-up in: 2 months with an Physicians Assistant

## 2019-05-02 ENCOUNTER — Ambulatory Visit (INDEPENDENT_AMBULATORY_CARE_PROVIDER_SITE_OTHER): Payer: PPO | Admitting: Orthopedic Surgery

## 2019-05-02 ENCOUNTER — Other Ambulatory Visit: Payer: Self-pay

## 2019-05-02 ENCOUNTER — Telehealth: Payer: Self-pay | Admitting: Orthopedic Surgery

## 2019-05-02 DIAGNOSIS — M25551 Pain in right hip: Secondary | ICD-10-CM | POA: Diagnosis not present

## 2019-05-02 DIAGNOSIS — M5431 Sciatica, right side: Secondary | ICD-10-CM | POA: Diagnosis not present

## 2019-05-02 DIAGNOSIS — M545 Low back pain, unspecified: Secondary | ICD-10-CM

## 2019-05-02 DIAGNOSIS — R262 Difficulty in walking, not elsewhere classified: Secondary | ICD-10-CM | POA: Diagnosis not present

## 2019-05-02 NOTE — Telephone Encounter (Signed)
Patient's daughter called wanting to speak to Dr. Marlou Sa in reference to the patient's appointment today.  CB#[phone].  Thank you.

## 2019-05-02 NOTE — Telephone Encounter (Signed)
Could either of you please make sure to contact patients daughter about her appointment? Thanks.

## 2019-05-03 ENCOUNTER — Encounter: Payer: Self-pay | Admitting: Family Medicine

## 2019-05-03 ENCOUNTER — Ambulatory Visit (INDEPENDENT_AMBULATORY_CARE_PROVIDER_SITE_OTHER): Payer: PPO | Admitting: Family Medicine

## 2019-05-03 VITALS — Temp 97.7°F | Ht 62.0 in | Wt 101.6 lb

## 2019-05-03 DIAGNOSIS — R29898 Other symptoms and signs involving the musculoskeletal system: Secondary | ICD-10-CM | POA: Diagnosis not present

## 2019-05-03 DIAGNOSIS — N390 Urinary tract infection, site not specified: Secondary | ICD-10-CM

## 2019-05-03 DIAGNOSIS — F0390 Unspecified dementia without behavioral disturbance: Secondary | ICD-10-CM | POA: Diagnosis not present

## 2019-05-03 DIAGNOSIS — I2510 Atherosclerotic heart disease of native coronary artery without angina pectoris: Secondary | ICD-10-CM

## 2019-05-03 DIAGNOSIS — E639 Nutritional deficiency, unspecified: Secondary | ICD-10-CM | POA: Diagnosis not present

## 2019-05-03 DIAGNOSIS — G3184 Mild cognitive impairment, so stated: Secondary | ICD-10-CM

## 2019-05-03 NOTE — Telephone Encounter (Signed)
Tried calling to advise had sent note to Dr Marlou Sa for him to call her. No answer. LMVM advising Dr Marlou Sa or Lurena Joiner would be in touch with her regarding appt for her mom.

## 2019-05-03 NOTE — Progress Notes (Signed)
PATIENT: [name] DOB: [birth year]  REASON FOR VISIT: follow up HISTORY FROM: patient  Chief Complaint  Patient presents with   Follow-up    Room 2, with granddaughter. Possible increase in Namenda. MRI wanted.     HISTORY OF PRESENT ILLNESS: Today 05/09/19 [name] is a 85 y.o. female here today for follow up for memory loss. She continues Namenda 69m twice daily. She presents today with her granddaughter who is a nMarine scientist Her granddaughter reports that her cognition has seemed to decline in the past month. Although she is usually oriented, she "seems off" to family. Her granddaughter has noticed that she sounds weak when talking to her on the phone and seems somewhat confused at times. She was evaluated in the er on 11/25 for a questionable fall. She reports waking in the middle of the night and slide out of bed, onto her bottom. She adamantly denies hitting her head. Workup in ER showed concerns for UTI. No head CT performed. Last head CT in 04/2018 showed generalized cerebral atrophy and white matter disease. She is followed closely by PCP, rheumatology, cardiology and urology. She reports BP is labile but well managed on Coreg, apresoline and Imdur. She is taking asa 836mand Questran. Not on statin therapy. [facility] [place] [facility] [place] [facility] to be. She uses a walker for ambulation around her apartment. She is working with PT for back pain related to compression fractures. She is followed by urology for management of recurrent UTI's and recently referred to ID. Last culture shows ecoli, however, patient has documented allergy to all sensitive abx's on culture. She does report increased weakness over the past month, most noticeable over the past few days.  HISTORY: (copied from Dr PeGladstone Lighterote on 10/19/2017)  UPDATE (10/19/17, VRP): Since last visit, memory loss slightly getting worse.  Also having some issues with intermittent muscle spasm, lower  back pain, balance difficulty.  Also having some intermittent abdominal pain.   PRIOR HPI (04/20/17): [age]ear old right-handed female with history of hypertension, coronary artery bypass graft surgery 2007, indolent multiple myeloma, here for evaluation of dementia.  The patient reports some short-term memory problems and confusion since 2016.  According to patient's daughter symptoms started around 2014.  Patient sought medical attention and neurology consultation in 2016.  At that time MRI of the brain showed right frontal ischemic infarction (chronic) and mild chronic small vessel ischemic disease.  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  She was started on Aricept at that time.  In 2018 she was started on Namenda.  At some point her Aricept was discontinued due to GI symptoms, which she is getting worked up.  Symptoms have gradually worsened over time.  She is having problems with short-term memory, recall, organizing her thoughts, recalling events, remembering tasks.  She needs more help with complex financial transactions.  Needs more help with remembering how to take her medications.  [facility]  In 2017 she transferred to assisted living.  Patient does have some anxiety and decreased energy symptoms.   REVIEW OF SYSTEMS: Out of a complete 14 system review of symptoms, the patient complains only of the following symptoms, memory loss, weakness and all other reviewed systems are negative.  ALLERGIES: Allergies  Allergen Reactions   Butazolidin [Phenylbutazone] Swelling and Rash   Cephalosporins Other (See Comments)    Site of swelling not recalled   Levaquin [Levofloxacin]  Shortness Of Breath   Morphine And Related Other (See Comments)    Hallucinations    Trimethoprim Nausea Only   Aleve [Naproxen] Other (See Comments)    Was told by a MD to "never take this"   Buprenorphine Other (See Comments)     "Allergic," per [facility]   Cilostazol Other (See Comments)    Stomach upset   Codeine Nausea And Vomiting    Tolerates Tramadol   Dexilant [Dexlansoprazole] Diarrhea   Diclofenac Other (See Comments)    Documented on MAR   Erythromycin Nausea And Vomiting    All "mycin" drugs   Lactose Intolerance (Gi) Diarrhea   Macrodantin Other (See Comments)    Double vision Tolerates MacroBID   Morphine Other (See Comments)    Reaction not recalled   Simvastatin Other (See Comments)    Muscle aches    Sulfa Antibiotics Other (See Comments)    "Allergic," per [facility]   Vesicare [Solifenacin] Other (See Comments)    Reaction not recalled by the patient- gets a lot of UTI(s)   Clindamycin/Lincomycin Other (See Comments)    Upsets the stomach   Doxycycline Diarrhea   Food Other (See Comments)    No spicy foods or black pepper, raw onions - causes gerd   Ibuprofen Hives and Rash    Was told by a MD to "never take this"   Lincomycin Hcl Nausea Only and Other (See Comments)    Upsets the stomach   Penicillins Rash    Has patient had a PCN reaction causing immediate rash, facial/tongue/throat swelling, SOB or lightheadedness with hypotension: Yes Has patient had a PCN reaction causing severe rash involving mucus membranes or skin necrosis: No Has patient had a PCN reaction that required hospitalization No Has patient had a PCN reaction occurring within the last 10 years: No If all of the above answers are "NO", then may proceed with Cephalosporin use.    Toviaz [Fesoterodine Fumarate Er] Other (See Comments)    Reaction not recalled   Voltaren [Diclofenac Sodium] Rash    HOME MEDICATIONS: Outpatient Medications Prior to Visit  Medication Sig Dispense Refill   acetaminophen (TYLENOL) 650 MG CR tablet Take 1,300 mg by mouth 2 (two) times daily.     Alum Hydroxide-Mag Carbonate (GAVISCON EXTRA STRENGTH) 508-475 MG/10ML SUSP Take 15 mLs by mouth 4 (four) times daily as needed (for  reflux).      Artificial Tear Solution (SOOTHE XP) SOLN Place 1 drop into both eyes 2 (two) times daily.     aspirin EC 81 MG tablet Take 1 tablet (81 mg total) by mouth daily. 90 tablet 3   baclofen 5 MG TABS Take 5 mg by mouth 3 (three) times daily as needed for muscle spasms. 30 each 0   Biotin 5000 MCG SUBL Place 10,000 mcg under the tongue daily.     Calcium Carb-Cholecalciferol (CALCIUM 600+D3) 600-200 MG-UNIT TABS Take 1 tablet by mouth 2 (two) times daily.     carvedilol (COREG) 3.125 MG tablet Take 1 tablet (3.125 mg total) by mouth 2 (two) times daily with a meal. 60 tablet 3   cholestyramine (QUESTRAN) 4 g packet Take 4 g by mouth as directed. 1/2 pack every 2 days     conjugated estrogens (PREMARIN) vaginal cream Place 1 Applicatorful vaginally 2 (two) times a week.     Cranberry (ELLURA) 200 MG CAPS Take 200 mg by mouth daily.     cycloSPORINE (RESTASIS) 0.05 % ophthalmic emulsion Place 1 drop into  both eyes 2 (two) times daily.      D-Mannose POWD Take 1 Scoop by mouth daily.     docusate sodium (COLACE) 100 MG capsule Take 100 mg by mouth See admin instructions. Take 100 mg by mouth once a day and an additional 100 mg as needed for constipation     famotidine (PEPCID) 20 MG tablet Take 1 tablet (20 mg total) by mouth 2 (two) times daily. 60 tablet 3   furosemide (LASIX) 20 MG tablet Take 1 tablet (20 mg total) by mouth daily. 10 tablet 0   gabapentin (NEURONTIN) 100 MG capsule Take 100 mg by mouth 2 (two) times daily.      hydrALAZINE (APRESOLINE) 10 MG tablet Take 1 tablet (10 mg total) by mouth daily at 3 pm. (Patient taking differently: Take 10 mg by mouth See admin instructions. Take 10 mg by mouth daily at 2:30 PM and an additional 10 mg two times a day as needed if Systolic reading is >093) 90 tablet 3   hydrALAZINE (APRESOLINE) 25 MG tablet Take 1 tablet (25 mg total) by mouth 2 (two) times daily. 8 am and 10 pm (Patient taking differently: Take 25 mg by mouth  2 (two) times daily. ) 180 tablet 3   iron polysaccharides (NU-IRON) 150 MG capsule Take 150 mg by mouth 2 (two) times daily.     isosorbide mononitrate (IMDUR) 60 MG 24 hr tablet Take 60 mg by mouth daily.     lactobacillus acidophilus (BACID) TABS tablet Take 1 tablet by mouth 2 (two) times daily.     levocetirizine (XYZAL) 5 MG tablet Take 1 tablet (5 mg total) by mouth every evening. (Patient taking differently: Take 5 mg by mouth at bedtime. ) 30 tablet 5   lidocaine (XYLOCAINE) 4 % external solution Apply topically as needed. To toes for pain 50 mL 0   LORazepam (ATIVAN) 0.5 MG tablet Take 1 tablet (0.5 mg total) by mouth See admin instructions. Take 0.5 mg by mouth at bedtime and 0.5 mg two times a day as needed for anxiety (Patient taking differently: Take 0.5 mg by mouth as directed. ) 10 tablet 0   Melatonin 5 MG TABS Take 5 mg by mouth at bedtime.     memantine (NAMENDA) 10 MG tablet Take 10 mg by mouth 2 (two) times daily.     Menthol, Topical Analgesic, (BIOFREEZE) 4 % GEL Apply topically as needed.     methylcellulose (CITRUCEL) oral powder Take 0.5-1 packets by mouth 2 (two) times daily as needed (for constipation).     mirabegron ER (MYRBETRIQ) 50 MG TB24 tablet Take 25 mg by mouth daily.      montelukast (SINGULAIR) 10 MG tablet Take 10 mg by mouth at bedtime.      MONUROL 3 g PACK      Multiple Vitamins-Minerals (CENTRUM SILVER 50+WOMEN) TABS Take 1 tablet by mouth daily.     NEOMYCIN-POLYMYXIN-HYDROCORTISONE (CORTISPORIN) 1 % SOLN OTIC solution Apply 1-2 drops to both toes once daily until all used 10 mL 0   nitroGLYCERIN (NITROSTAT) 0.4 MG SL tablet Place 1 tablet (0.4 mg total) under the tongue every 5 (five) minutes as needed for chest pain. Reported on 11/12/2015 25 tablet 2   Peppermint Oil (IBGARD) 90 MG CPCR Take 2 capsules by mouth as directed. Per box instructions (Patient taking differently: Take 1 capsule by mouth 2 (two) times daily before a meal. ) 1  capsule 0   polyethylene glycol (MIRALAX / GLYCOLAX) packet  Take 17 g by mouth 2 (two) times daily. (Patient taking differently: Take 8.5-17 g by mouth See admin instructions. Mix and drink 17 grams two times a day and an additional 8.5-17 grams once daily, as needed for constipation) 1 each 0   prednisoLONE acetate (PRED FORTE) 1 % ophthalmic suspension Place 1 drop into both eyes 2 (two) times daily as needed.     Propylene Glycol (SYSTANE BALANCE) 0.6 % SOLN Place 1 drop into both eyes 4 (four) times daily as needed.     sertraline (ZOLOFT) 50 MG tablet Take 50 mg by mouth every evening.     traMADol (ULTRAM) 50 MG tablet Take 50 mg by mouth every 8 (eight) hours as needed for moderate pain.      No facility-administered medications prior to visit.     PAST MEDICAL HISTORY: Past Medical History:  Diagnosis Date   Anxiety    Arthritis    CAD (coronary artery disease)    Chondrocostal junction syndrome    Chronic fatigue    Dementia (HCC)    without Behavioral Disturbance   GERD (gastroesophageal reflux disease)    Hx of CABG    Hyperlipidemia    GOAL LDL <70   Hypertension    IBS (irritable bowel syndrome)    Jaundice    yellow jaundice 3rd grade   Major depressive disorder    Mild chronic anemia    Mixed incontinence    Multiple myeloma (HCC)    not having achieved remission   NSTEMI (non-ST elevated myocardial infarction) ([place]) 01/31/2017   Osteopenia    Ovarian cyst, bilateral    Rt complex   Panic disorder    Pulmonary nodules    Radiculopathy    Raynaud's syndrome    Restless legs syndrome    Spinal stenosis of lumbar region    Thyroid nodule     PAST SURGICAL HISTORY: Past Surgical History:  Procedure Laterality Date   BUNIONECTOMY WITH HAMMERTOE RECONSTRUCTION Right 08/04/2012   Procedure:  HAMMERTOE RECONSTRUCTION;  Surgeon: Colin Rhein, MD;  Location: [place];  Service: Orthopedics;  Laterality: Right;   HAMMER TOE RECONSTRUCTION PROBABLE FLEXOR DIGITORUM LONGUS TO PROXIMAL PHALANX TRANSFER LATERALIZED    CAPSULOTOMY Right 08/04/2012   Procedure: CAPSULOTOMY;  Surgeon: Colin Rhein, MD;  Location: [place];  Service: Orthopedics;  Laterality: Right;  RIGHT 2ND TOE METATARSOPHALANGEAL JOINT DORSAL CAPSULOTOMY    CATARACT EXTRACTION, BILATERAL     both cataracts   COLONOSCOPY     CORONARY ANGIOPLASTY WITH STENT PLACEMENT  2014   2 stents   CORONARY ARTERY BYPASS GRAFT  2007   LIMA to LAD, SVG to RCA  non-ST segment MI 08/2009 (Dr. Tamala Julian)   CORONARY STENT INTERVENTION N/A 02/02/2017   Procedure: CORONARY STENT INTERVENTION;  Surgeon: Belva Crome, MD;  Location: [place] [facility];  Service: Cardiovascular;  Laterality: N/A;  Prox CFX   DILATION AND CURETTAGE OF UTERUS     LEFT HEART CATH AND CORS/GRAFTS ANGIOGRAPHY N/A 02/02/2017   Procedure: LEFT HEART CATH AND CORS/GRAFTS ANGIOGRAPHY;  Surgeon: Belva Crome, MD;  Location: [place] [facility];  Service: Cardiovascular;  Laterality: N/A;   LEFT HEART CATHETERIZATION WITH CORONARY/GRAFT ANGIOGRAM N/A 07/09/2012   Procedure: LEFT HEART CATHETERIZATION WITH Beatrix Fetters;  Surgeon: Sinclair Grooms, MD;  Location: [facility] C[facility];  Service: Cardiovascular;  Laterality: N/A;   TONSILLECTOMY AND ADENOIDECTOMY     UPPER GASTROINTESTINAL ENDOSCOPY  VISCERAL ANGIOGRAPHY N/A 04/15/2017   Procedure: VISCERAL ANGIOGRAPHY;  Surgeon: Waynetta Sandy, MD;  Location: [place] [facility];  Service: Cardiovascular;  Laterality: N/A;    FAMILY HISTORY: Family History  Problem Relation Age of Onset   Heart attack Mother    CAD Mother    Obesity Sister    Breast cancer Paternal Aunt    Ulcers Father    Asthma Neg Hx    Eczema Neg Hx    Urticaria Neg Hx    Allergic rhinitis Neg Hx    Angioedema Neg Hx    Atopy Neg Hx    Immunodeficiency Neg Hx     SOCIAL HISTORY: Social History    Socioeconomic History   Marital status: Married    Spouse name: Not on file   Number of children: 4   Years of education: Not on file   Highest education level: Not on file  Occupational History   Occupation: homemaker    Occupation: Materials engineer education  Social Designer, fashion/clothing strain: Not on file   Food insecurity    Worry: Not on file    Inability: Not on file   Transportation needs    Medical: Not on file    Non-medical: Not on file  Tobacco Use   Smoking status: Never Smoker   Smokeless tobacco: Never Used  Substance and Sexual Activity   Alcohol use: Never    Frequency: Never   Drug use: No   Sexual activity: Never  Lifestyle   Physical activity    Days per week: Not on file    Minutes per session: Not on file   Stress: Not on file  Relationships   Social connections    Talks on phone: Not on file    Gets together: Not on file    Attends religious service: Not on file    Active member of club or organization: Not on file    Attends meetings of clubs or organizations: Not on file    Relationship status: Not on file   Intimate partner violence    Fear of current or ex partner: Not on file    Emotionally abused: Not on file    Physically abused: Not on file    Forced sexual activity: Not on file  Other Topics Concern   Not on file  Social History Narrative   Not on file      PHYSICAL EXAM  Vitals:   05/03/19 1351  Temp: 97.7 F (36.5 C)  Weight: 101 lb 9.6 oz (46.1 kg)  Height: _0  (1.575 m)   Body mass index is 18.58 kg/m.  Generalized: Well developed, in no acute distress  Cardiology: normal rate and rhythm, no murmur noted Respiratory: clear to auscultation bilaterally Neurological examination  Mentation: Alert oriented to time, place, history taking, however, is having inappropriate conversation throughout visit with questionable delusions. Patient often closes eyes and appears to go to sleep when  not stimulated. Follows all commands speech and language fluent Cranial nerve II-XII: Pupils were equal round reactive to light. Extraocular movements were full, visual field were full on confrontational test. Facial sensation and strength were normal. Uvula tongue midline. Head turning and shoulder shrug  were normal and symmetric. Motor: The motor testing reveals 4 over 5 strength of bilateral upper extremities, 3/5 of bilateral lower extremities. Good symmetric motor tone is noted throughout.  Sensory: Sensory testing is intact to soft touch on all 4 extremities. No evidence of extinction  is noted.  Coordination: Cerebellar testing reveals good finger-nose-finger, patient reports weakness with heel to shin and was unable to complete.  Gait and station: Gait is short but mostly stable with Rolator.  Reflexes: Deep tendon reflexes are symmetric and normal bilaterally.   DIAGNOSTIC DATA (LABS, IMAGING, TESTING) - I reviewed patient records, labs, notes, testing and imaging myself where available.  MMSE - Mini Mental State Exam 05/03/2019 10/19/2017 04/20/2017  Orientation to time _0 Orientation to Place _1 Registration _2 Attention/ Calculation _3 Recall _4 Language- name 2 objects _5 Language- repeat _6 Language- follow 3 step command _7 Language- read & follow direction _8 Write a sentence _9 Copy design 1 1 0  Copy design-comments name 4 animals - -  Total score _10 Lab Results  Component Value Date   WBC 7.1 05/04/2019   HGB 10.3 (L) 05/04/2019   HCT 31.4 (L) 05/04/2019   MCV 101.9 (H) 05/04/2019   PLT 395 05/04/2019      Component Value Date/Time   NA 135 05/04/2019 0509   NA 138 09/20/2013 0949   K 3.3 (L) 05/04/2019 0509   K 4.4 09/20/2013 0949   CL 98 05/04/2019 0509   CL 97 (L) 09/10/2012 1257   CO2 27 05/04/2019 0509   CO2 29 09/20/2013 0949   GLUCOSE 104 (H) 05/04/2019 0509   GLUCOSE 75 09/20/2013 0949   GLUCOSE  88 09/10/2012 1257   BUN 18 05/04/2019 0509   BUN 22.2 09/20/2013 0949   CREATININE 0.63 05/04/2019 0509   CREATININE 0.77 04/14/2019 1500   CREATININE 1.0 09/20/2013 0949   CALCIUM 9.2 05/04/2019 0509   CALCIUM 9.7 09/20/2013 0949   PROT 6.9 04/14/2019 1500   PROT 8.5 (H) 09/20/2013 0949   ALBUMIN 3.0 (L) 04/14/2019 1500   ALBUMIN 3.6 09/20/2013 0949   AST 32 04/14/2019 1500   AST 35 (H) 09/20/2013 0949   ALT 18 04/14/2019 1500   ALT 17 09/20/2013 0949   ALKPHOS 67 04/14/2019 1500   ALKPHOS 39 (L) 09/20/2013 0949   BILITOT <0.2 (L) 04/14/2019 1500   BILITOT 0.31 09/20/2013 0949   GFRNONAA >60 05/04/2019 0509   GFRNONAA >60 04/14/2019 1500   GFRAA >60 05/04/2019 0509   GFRAA >60 04/14/2019 1500   Lab Results  Component Value Date   CHOL 152 02/01/2017   HDL 75 02/01/2017   LDLCALC 69 02/01/2017   TRIG 41 02/01/2017   CHOLHDL 2.0 02/01/2017   Lab Results  Component Value Date   HGBA1C 6.1 (H) 07/08/2012   No results found for: [id number] Lab Results  Component Value Date   TSH 3.070 08/29/2017     ASSESSMENT AND PLAN 85 y.o. year old female  has a past medical history of Anxiety, Arthritis, CAD (coronary artery disease), Chondrocostal junction syndrome, Chronic fatigue, Dementia (HCC), GERD (gastroesophageal reflux disease), CABG, Hyperlipidemia, Hypertension, IBS (irritable bowel syndrome), Jaundice, Major depressive disorder, Mild chronic anemia, Mixed incontinence, Multiple myeloma ([place]), NSTEMI (non-ST elevated myocardial infarction) ([place]) (01/31/2017), Osteopenia, Ovarian cyst, bilateral, Panic disorder, Pulmonary nodules, Radiculopathy, Raynaud's syndrome, Restless legs syndrome, Spinal stenosis of lumbar region, and Thyroid nodule. here with     ICD-10-CM   1. MCI (mild cognitive impairment)  G31.84   2. CAD, multiple vessel  I25.10   3. Weakness of both  lower extremities  R29.898   4. Recurrent UTI  N39.0     Mrs Woodring continues Namenda 53m twice daily  without obvious adverse effects. MMSE is stable today at 28/30, however, she does seem to be intermittently confused. I question possible delusions with some conversation. She has noted bilateral lower extremity weakness on exam. It is unclear as to length of time this has been present. She presents with her granddaughter today who has only spoken to her via telephone and has not seen her in 9 months. I contacted RAvayawith these concerns. Nurse reports that Mrs SHeaglehas had difficulty with ambulation recently. I have asked that she be evaluated urgently by RValda LambNP, patient's current PCP. I am hesitant to recommend ER evaluation at this time as we are unclear of baseline. Vital signs stable and patient in no acute distress. NP will evaluate once Ms. SDifrancescois back on premises. Recent urine culture and labs reviewed and abnormalities discussed with RPassenger transport manager I have discussed possibility of progressive cognitive decline with her granddaughter. She will continue Namenda 14mtwice daily as prescribed. We will follow up in 6 months, sooner if needed. She and her granddaughter verbalize understanding with this plan.    No orders of the defined types were placed in this encounter.    No orders of the defined types were placed in this encounter.     I spent 60 minutes with the patient. 50% of this time was spent counseling and educating patient on plan of care and medications.     AmDebbora PrestoFNP-C 05/09/2019, 7:57 PM [facility] [id number] StreetSuBurnsvillerGoddardNC [phone]

## 2019-05-03 NOTE — Progress Notes (Signed)
I faxed to [facility] physician consult form [phone]. Received.

## 2019-05-04 ENCOUNTER — Encounter: Payer: Self-pay | Admitting: Orthopedic Surgery

## 2019-05-04 ENCOUNTER — Other Ambulatory Visit: Payer: Self-pay

## 2019-05-04 ENCOUNTER — Emergency Department (HOSPITAL_COMMUNITY): Payer: PPO

## 2019-05-04 ENCOUNTER — Emergency Department (HOSPITAL_COMMUNITY)
Admission: EM | Admit: 2019-05-04 | Discharge: 2019-05-04 | Disposition: A | Discharge status: Home / Self Care | Payer: PPO | Attending: Emergency Medicine | Admitting: Emergency Medicine

## 2019-05-04 ENCOUNTER — Encounter (HOSPITAL_COMMUNITY): Payer: Self-pay

## 2019-05-04 DIAGNOSIS — M25551 Pain in right hip: Secondary | ICD-10-CM | POA: Insufficient documentation

## 2019-05-04 DIAGNOSIS — Z951 Presence of aortocoronary bypass graft: Secondary | ICD-10-CM | POA: Insufficient documentation

## 2019-05-04 DIAGNOSIS — Y999 Unspecified external cause status: Secondary | ICD-10-CM | POA: Diagnosis not present

## 2019-05-04 DIAGNOSIS — S79912A Unspecified injury of left hip, initial encounter: Secondary | ICD-10-CM | POA: Diagnosis not present

## 2019-05-04 DIAGNOSIS — W19XXXA Unspecified fall, initial encounter: Secondary | ICD-10-CM

## 2019-05-04 DIAGNOSIS — Z7982 Long term (current) use of aspirin: Secondary | ICD-10-CM | POA: Insufficient documentation

## 2019-05-04 DIAGNOSIS — I10 Essential (primary) hypertension: Secondary | ICD-10-CM | POA: Diagnosis not present

## 2019-05-04 DIAGNOSIS — S299XXA Unspecified injury of thorax, initial encounter: Secondary | ICD-10-CM | POA: Diagnosis not present

## 2019-05-04 DIAGNOSIS — I2510 Atherosclerotic heart disease of native coronary artery without angina pectoris: Secondary | ICD-10-CM | POA: Diagnosis not present

## 2019-05-04 DIAGNOSIS — Z79899 Other long term (current) drug therapy: Secondary | ICD-10-CM | POA: Insufficient documentation

## 2019-05-04 DIAGNOSIS — R0902 Hypoxemia: Secondary | ICD-10-CM | POA: Diagnosis not present

## 2019-05-04 DIAGNOSIS — S79911A Unspecified injury of right hip, initial encounter: Secondary | ICD-10-CM | POA: Diagnosis not present

## 2019-05-04 DIAGNOSIS — Z743 Need for continuous supervision: Secondary | ICD-10-CM | POA: Diagnosis not present

## 2019-05-04 DIAGNOSIS — M25559 Pain in unspecified hip: Secondary | ICD-10-CM

## 2019-05-04 DIAGNOSIS — R262 Difficulty in walking, not elsewhere classified: Secondary | ICD-10-CM | POA: Diagnosis not present

## 2019-05-04 DIAGNOSIS — M5431 Sciatica, right side: Secondary | ICD-10-CM | POA: Diagnosis not present

## 2019-05-04 DIAGNOSIS — Y92009 Unspecified place in unspecified non-institutional (private) residence as the place of occurrence of the external cause: Secondary | ICD-10-CM | POA: Diagnosis not present

## 2019-05-04 DIAGNOSIS — W010XXA Fall on same level from slipping, tripping and stumbling without subsequent striking against object, initial encounter: Secondary | ICD-10-CM | POA: Diagnosis not present

## 2019-05-04 DIAGNOSIS — R404 Transient alteration of awareness: Secondary | ICD-10-CM | POA: Diagnosis not present

## 2019-05-04 DIAGNOSIS — Y939 Activity, unspecified: Secondary | ICD-10-CM | POA: Diagnosis not present

## 2019-05-04 DIAGNOSIS — R400 Somnolence: Secondary | ICD-10-CM | POA: Diagnosis not present

## 2019-05-04 LAB — CBC WITH DIFFERENTIAL/PLATELET
Abs Immature Granulocytes: 0.02 10*3/uL (ref 0.00–0.07)
Basophils Absolute: 0 10*3/uL (ref 0.0–0.1)
Basophils Relative: 0 %
Eosinophils Absolute: 0.1 10*3/uL (ref 0.0–0.5)
Eosinophils Relative: 1 %
HCT: 31.4 % — ABNORMAL LOW (ref 36.0–46.0)
Hemoglobin: 10.3 g/dL — ABNORMAL LOW (ref 12.0–15.0)
Immature Granulocytes: 0 %
Lymphocytes Relative: 15 %
Lymphs Abs: 1.1 10*3/uL (ref 0.7–4.0)
MCH: 33.4 pg (ref 26.0–34.0)
MCHC: 32.8 g/dL (ref 30.0–36.0)
MCV: 101.9 fL — ABNORMAL HIGH (ref 80.0–100.0)
Monocytes Absolute: 0.8 10*3/uL (ref 0.1–1.0)
Monocytes Relative: 12 %
Neutro Abs: 5.1 10*3/uL (ref 1.7–7.7)
Neutrophils Relative %: 72 %
Platelets: 395 10*3/uL (ref 150–400)
RBC: 3.08 MIL/uL — ABNORMAL LOW (ref 3.87–5.11)
RDW: 13.2 % (ref 11.5–15.5)
WBC: 7.1 10*3/uL (ref 4.0–10.5)
nRBC: 0 % (ref 0.0–0.2)

## 2019-05-04 LAB — URINALYSIS, ROUTINE W REFLEX MICROSCOPIC
Bilirubin Urine: NEGATIVE
Glucose, UA: NEGATIVE mg/dL
Hgb urine dipstick: NEGATIVE
Ketones, ur: NEGATIVE mg/dL
Leukocytes,Ua: NEGATIVE
Nitrite: POSITIVE — AB
Protein, ur: NEGATIVE mg/dL
Specific Gravity, Urine: 1.005 (ref 1.005–1.030)
pH: 8 (ref 5.0–8.0)

## 2019-05-04 LAB — BASIC METABOLIC PANEL
Anion gap: 10 (ref 5–15)
BUN: 18 mg/dL (ref 8–23)
CO2: 27 mmol/L (ref 22–32)
Calcium: 9.2 mg/dL (ref 8.9–10.3)
Chloride: 98 mmol/L (ref 98–111)
Creatinine, Ser: 0.63 mg/dL (ref 0.44–1.00)
GFR calc Af Amer: >60 mL/min (ref >60)
GFR calc non Af Amer: >60 mL/min (ref >60)
Glucose, Bld: 104 mg/dL — ABNORMAL HIGH (ref 70–99)
Potassium: 3.3 mmol/L — ABNORMAL LOW (ref 3.5–5.1)
Sodium: 135 mmol/L (ref 135–145)

## 2019-05-04 NOTE — ED Notes (Signed)
Urine culture added on by lab.

## 2019-05-04 NOTE — ED Triage Notes (Signed)
Patient coming from [facility] with complaints of a mechanical fall tonight. Patient has chronic lower back pain and got a double dose of tramadol tonight on top of ativan and melatonin. Patient stumbled when getting up to bathroom and fell on right hip and is complaining of right hip pain. No shortening or rotating noted. Patient not on blood thinners currently.

## 2019-05-04 NOTE — ED Provider Notes (Signed)
[facility] DEPT Provider Note   CSN: [id number] Arrival date & time: 05/04/19  0246     History   Chief Complaint Chief Complaint  Patient presents with  . Fall  . Hip Pain    (right)    HPI [name] is a 85 y.o. female.     Apparently has chronic back pain for which she took 2 tramadol and a Ativan last night.  She woke up middle the night and fell initially saying that her left hip hurt but then stated that she was having right muscle spasms.  EMS brought her here for further evaluation.  Did not hit her head.  Not pass out.  The history is provided by the patient.  Fall This is a new problem. The current episode started 1 to 2 hours ago. The problem occurs constantly. The problem has not changed since onset.Pertinent negatives include no chest pain and no headaches. Nothing aggravates the symptoms. Nothing relieves the symptoms. She has tried nothing for the symptoms. The treatment provided no relief.  Hip Pain Pertinent negatives include no chest pain and no headaches.    Past Medical History:  Diagnosis Date  . Anxiety   . Arthritis   . CAD (coronary artery disease)   . Chondrocostal junction syndrome   . Chronic fatigue   . Dementia ([place])    without Behavioral Disturbance  . GERD (gastroesophageal reflux disease)   . Hx of CABG   . Hyperlipidemia    GOAL LDL <70  . Hypertension   . IBS (irritable bowel syndrome)   . Jaundice    yellow jaundice 3rd grade  . Major depressive disorder   . Mild chronic anemia   . Mixed incontinence   . Multiple myeloma (HCC)    not having achieved remission  . NSTEMI (non-ST elevated myocardial infarction) ([place]) 01/31/2017  . Osteopenia   . Ovarian cyst, bilateral    Rt complex  . Panic disorder   . Pulmonary nodules   . Radiculopathy   . Raynaud's syndrome   . Restless legs syndrome   . Spinal stenosis of lumbar region   . Thyroid nodule     Patient Active Problem List   Diagnosis Date Noted  . Left hip pain 06/30/2018  . Disc degeneration, lumbar 06/23/2018  . Left thyroid nodule 08/29/2017  . Chronic idiopathic constipation 08/18/2017  . Abdominal pain 07/31/2017  . Mesenteric ischemia ([place]) 05/04/2017  . Unsteadiness on feet   . Unstable angina pectoris ([place])   . Tinea pedis   . Spinal stenosis of lumbar region   . Somnolence   . Seasonal allergic rhinitis   . Restless legs syndrome   . Raynaud's syndrome   . Raynauds syndrome   . Radiculopathy   . Panic disorder   . Ovarian cyst, bilateral   . Osteopenia   . Myocardial infarction ([place])   . Muscle weakness (generalized)   . Mixed incontinence   . Mild chronic anemia   . Major depressive disorder   . Long term (current) use of aspirin   . Intermediate coronary syndrome ([place])   . Incontinent of urine   . IBS (irritable bowel syndrome)   . Hypercholesteremia   . Hx of CABG   . GERD (gastroesophageal reflux disease)   . Dementia ([place])   . Chest pain   . Atherosclerotic heart disease of native coronary artery without angina pectoris   . Arthritis   . Anxiety   . Allergic  rhinitis   . Abnormal weight loss   . History of adenomatous polyp of colon 03/02/2017  . NSTEMI (non-ST elevated myocardial infarction) ([place]) 02/02/2017  . SOB (shortness of breath) 09/02/2016  . Dizziness 09/02/2016  . Non-ST elevation (NSTEMI) myocardial infarction ([place]) 11/26/2015  . Polymyalgia rheumatica ([place]) 11/26/2015  . Hypokalemia 11/26/2015  . Mild persistent asthma 11/12/2015  . Coronary artery disease involving autologous artery coronary bypass graft with angina pectoris with documented spasm ([place]) 11/12/2015  . Multiple myeloma in remission ([place]) 11/12/2015  . Current use of beta blocker 11/12/2015  . Gastroesophageal reflux disease without esophagitis 11/12/2015  . Other allergic rhinitis 11/12/2015  . Weight loss, non-intentional 10/03/2015  . Asthma 09/24/2015  . Asthma in adult without  complication 96/29/5284  . Seasonal allergic rhinitis due to pollen 09/24/2015  . Cervical radiculopathy 07/25/2015  . Chronic coronary artery disease 07/25/2015  . Coronary artery disease 07/25/2015  . Disturbance in sleep behavior 07/25/2015  . Elevated CK 07/25/2015  . Elevated creatine kinase level 07/25/2015  . Generalized osteoarthritis of hand 07/25/2015  . Other long term (current) drug therapy 07/25/2015  . Overactive bladder 07/25/2015  . Restless leg 07/25/2015  . Restless leg syndrome 07/25/2015  . Sleep disturbances 07/25/2015  . Idiopathic peripheral neuropathy 07/04/2015  . Lumbar radiculopathy, chronic 07/04/2015  . 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 Risk for falls 07/04/2015  . Sleep disturbance 07/04/2015  . Chest pain with moderate risk for cardiac etiology 06/13/2015  . Right sciatic nerve pain 10/19/2014  . Abnormal thyroid blood test 08/25/2014  . Chronic fatigue 08/09/2014  . Multiple myeloma ([place]) 07/21/2013  . Depression 01/14/2013  . Generalized anxiety disorder 01/14/2013  . Gastroesophageal reflux disease 01/13/2013  . Urine test positive for microalbuminuria 12/08/2011  . Cyst of ovary 12/17/2010  . KNEE PAIN, BILATERAL 08/15/2010  . Abnormal gait 08/15/2010  . Atherosclerosis of coronary artery bypass graft 10/11/2009  . CHEST PAIN, NON-CARDIAC 10/11/2009  . HIP PAIN, RIGHT 08/13/2009  . GREATER TROCHANTERIC BURSITIS 08/13/2009  . LUMBOSACRAL SPONDYLOSIS WITHOUT MYELOPATHY 11/10/2008  . Osteoarthritis of lumbosacral spine without myelopathy 11/10/2008  . FOOT PAIN, BILATERAL 06/30/2008  . Hyperlipidemia 06/22/2008  . Hypertension 06/22/2008  . Raynaud's disease 06/22/2008  . BURSITIS, LEFT SHOULDER 06/22/2008    Past Surgical History:  Procedure Laterality Date  . BUNIONECTOMY WITH HAMMERTOE RECONSTRUCTION Right 08/04/2012   Procedure:  HAMMERTOE RECONSTRUCTION;  Surgeon: Colin Rhein, MD;  Location: [facility];  Service:  Orthopedics;  Laterality: Right;  HAMMER TOE RECONSTRUCTION PROBABLE FLEXOR DIGITORUM LONGUS TO PROXIMAL PHALANX TRANSFER LATERALIZED   . CAPSULOTOMY Right 08/04/2012   Procedure: CAPSULOTOMY;  Surgeon: Colin Rhein, MD;  Location: [facility];  Service: Orthopedics;  Laterality: Right;  RIGHT 2ND TOE METATARSOPHALANGEAL JOINT DORSAL CAPSULOTOMY   . CATARACT EXTRACTION, BILATERAL     both cataracts  . COLONOSCOPY    . CORONARY ANGIOPLASTY WITH STENT PLACEMENT  2014   2 stents  . CORONARY ARTERY BYPASS GRAFT  2007   LIMA to LAD, SVG to RCA  non-ST segment MI 08/2009 (Dr. Tamala Julian)  . CORONARY STENT INTERVENTION N/A 02/02/2017   Procedure: CORONARY STENT INTERVENTION;  Surgeon: Belva Crome, MD;  Location: [facility];  Service: Cardiovascular;  Laterality: N/A;  Prox CFX  . DILATION AND CURETTAGE OF UTERUS    . LEFT HEART CATH AND CORS/GRAFTS ANGIOGRAPHY N/A 02/02/2017   Procedure: LEFT HEART CATH AND CORS/GRAFTS ANGIOGRAPHY;  Surgeon: Belva Crome, MD;  Location: [facility];  Service: Cardiovascular;  Laterality: N/A;  . LEFT HEART CATHETERIZATION WITH CORONARY/GRAFT ANGIOGRAM N/A 07/09/2012   Procedure: LEFT HEART CATHETERIZATION WITH Beatrix Fetters;  Surgeon: Sinclair Grooms, MD;  Location: [facility] CATH LAB;  Service: Cardiovascular;  Laterality: N/A;  . TONSILLECTOMY AND ADENOIDECTOMY    . UPPER GASTROINTESTINAL ENDOSCOPY    . VISCERAL ANGIOGRAPHY N/A 04/15/2017   Procedure: VISCERAL ANGIOGRAPHY;  Surgeon: Waynetta Sandy, MD;  Location: [facility];  Service: Cardiovascular;  Laterality: N/A;     OB History   No obstetric history on file.      Home Medications    Prior to Admission medications   Medication Sig Start Date End Date Taking? Authorizing Provider  nitroGLYCERIN (NITROSTAT) 0.4 MG SL tablet Place 1 tablet (0.4 mg total) under the tongue every 5 (five) minutes as needed for chest pain. Reported on 11/12/2015 02/03/17  Yes  Lyda Jester M, PA-C  acetaminophen (TYLENOL) 650 MG CR tablet Take 1,300 mg by mouth 2 (two) times daily.    [provider]  Alum Hydroxide-Mag Carbonate (GAVISCON EXTRA STRENGTH) [phone] MG/10ML SUSP Take 15 mLs by mouth 4 (four) times daily as needed (for reflux).     [provider]  Artificial Tear Solution (SOOTHE XP) SOLN Place 1 drop into both eyes 2 (two) times daily.    [provider]  aspirin EC 81 MG tablet Take 1 tablet (81 mg total) by mouth daily. 01/21/16   Minus Breeding, MD  baclofen 5 MG TABS Take 5 mg by mouth 3 (three) times daily as needed for muscle spasms. 07/02/18   Debbe Odea, MD  Biotin 5000 MCG SUBL Place 10,000 mcg under the tongue daily.    [provider]  Calcium Carb-Cholecalciferol (CALCIUM 600+D3) 600-200 MG-UNIT TABS Take 1 tablet by mouth 2 (two) times daily.    [provider]  carvedilol (COREG) 3.125 MG tablet Take 1 tablet (3.125 mg total) by mouth 2 (two) times daily with a meal. 01/05/18   Minus Breeding, MD  cholestyramine (QUESTRAN) 4 g packet Take 4 g by mouth as directed. 1/2 pack every 2 days    [provider]  conjugated estrogens (PREMARIN) vaginal cream Place 1 Applicatorful vaginally 2 (two) times a week.    [provider]  Cranberry (ELLURA) 200 MG CAPS Take 200 mg by mouth daily.    [provider]  cycloSPORINE (RESTASIS) 0.05 % ophthalmic emulsion Place 1 drop into both eyes 2 (two) times daily.     [provider]  D-Mannose POWD Take 1 Scoop by mouth daily.    [provider]  docusate sodium (COLACE) 100 MG capsule Take 100 mg by mouth See admin instructions. Take 100 mg by mouth once a day and an additional 100 mg as needed for constipation    [provider]  famotidine (PEPCID) 20 MG tablet Take 1 tablet (20 mg total) by mouth 2 (two) times daily. 09/24/18   Pyrtle, Lajuan Lines, MD  furosemide (LASIX) 20 MG tablet Take 1 tablet (20 mg  total) by mouth daily. 04/29/19 07/28/19  Lendon Colonel, NP  gabapentin (NEURONTIN) 100 MG capsule Take 100 mg by mouth 2 (two) times daily.     [provider]  hydrALAZINE (APRESOLINE) 10 MG tablet Take 1 tablet (10 mg total) by mouth daily at 3 pm. Patient taking differently: Take 10 mg by mouth See admin instructions. Take 10 mg by mouth daily at  2:30 PM and an additional 10 mg two times a day as needed if Systolic reading is >275 17/00/17   Almyra Deforest, PA  hydrALAZINE (APRESOLINE) 25 MG tablet Take 1 tablet (25 mg total) by mouth 2 (two) times daily. 8 am and 10 pm Patient taking differently: Take 25 mg by mouth 2 (two) times daily.  05/26/18   Almyra Deforest, PA  iron polysaccharides (NU-IRON) 150 MG capsule Take 150 mg by mouth 2 (two) times daily.    [provider]  isosorbide mononitrate (IMDUR) 60 MG 24 hr tablet Take 60 mg by mouth daily.    [provider]  lactobacillus acidophilus (BACID) TABS tablet Take 1 tablet by mouth 2 (two) times daily.    [provider]  levocetirizine (XYZAL) 5 MG tablet Take 1 tablet (5 mg total) by mouth every evening. Patient taking differently: Take 5 mg by mouth at bedtime.  05/13/17   Charlies Silvers, MD  lidocaine (XYLOCAINE) 4 % external solution Apply topically as needed. To toes for pain 09/28/18   Landis Martins, DPM  LORazepam (ATIVAN) 0.5 MG tablet Take 1 tablet (0.5 mg total) by mouth See admin instructions. Take 0.5 mg by mouth at bedtime and 0.5 mg two times a day as needed for anxiety Patient taking differently: Take 0.5 mg by mouth as directed.  07/02/18   Debbe Odea, MD  Melatonin 5 MG TABS Take 5 mg by mouth at bedtime.    [provider]  memantine (NAMENDA) 10 MG tablet Take 10 mg by mouth 2 (two) times daily.    [provider]  Menthol, Topical Analgesic, (BIOFREEZE) 4 % GEL Apply topically as needed.    [provider]  methylcellulose (CITRUCEL) oral powder Take 0.5-1  packets by mouth 2 (two) times daily as needed (for constipation).    [provider]  mirabegron ER (MYRBETRIQ) 50 MG TB24 tablet Take 25 mg by mouth daily.     [provider]  montelukast (SINGULAIR) 10 MG tablet Take 10 mg by mouth at bedtime.     [provider]  MONUROL 3 g PACK  02/22/19   [provider]  Multiple Vitamins-Minerals (CENTRUM SILVER 50+WOMEN) TABS Take 1 tablet by mouth daily.    [provider]  NEOMYCIN-POLYMYXIN-HYDROCORTISONE (CORTISPORIN) 1 % SOLN OTIC solution Apply 1-2 drops to both toes once daily until all used 09/28/18   Stover, Titorya, DPM  Peppermint Oil (IBGARD) 90 MG CPCR Take 2 capsules by mouth as directed. Per box instructions Patient taking differently: Take 1 capsule by mouth 2 (two) times daily before a meal.  02/23/18   Pyrtle, Lajuan Lines, MD  polyethylene glycol (MIRALAX / GLYCOLAX) packet Take 17 g by mouth 2 (two) times daily. Patient taking differently: Take 8.5-17 g by mouth See admin instructions. Mix and drink 17 grams two times a day and an additional 8.5-17 grams once daily, as needed for constipation 02/23/18   Pyrtle, Lajuan Lines, MD  prednisoLONE acetate (PRED FORTE) 1 % ophthalmic suspension Place 1 drop into both eyes 2 (two) times daily as needed.    [provider]  Propylene Glycol (SYSTANE BALANCE) 0.6 % SOLN Place 1 drop into both eyes 4 (four) times daily as needed.    [provider]  sertraline (ZOLOFT) 50 MG tablet Take 50 mg by mouth every evening.    [provider]  traMADol (ULTRAM) 50 MG tablet Take 50 mg by mouth every 8 (eight) hours as needed for  moderate pain.     [provider]    Family History Family History  Problem Relation Age of Onset  . Heart attack Mother   . CAD Mother   . Obesity Sister   . Breast cancer Paternal Aunt   . Ulcers Father   . Asthma Neg Hx   . Eczema Neg Hx   . Urticaria Neg Hx   . Allergic rhinitis Neg Hx   . Angioedema  Neg Hx   . Atopy Neg Hx   . Immunodeficiency Neg Hx     Social History Social History   Tobacco Use  . Smoking status: Never Smoker  . Smokeless tobacco: Never Used  Substance Use Topics  . Alcohol use: Never    Frequency: Never  . Drug use: No     Allergies   Butazolidin [phenylbutazone], Cephalosporins, Codeine, Levaquin [levofloxacin], Macrodantin, Morphine and related, Trimethoprim, Aleve [naproxen], Buprenorphine, Cilostazol, Dexilant [dexlansoprazole], Diclofenac, Erythromycin, Lactose intolerance (gi), Morphine, Simvastatin, Sulfa antibiotics, Vesicare [solifenacin], Clindamycin/lincomycin, Doxycycline, Food, Ibuprofen, Lincomycin hcl, Penicillins, Toviaz [fesoterodine fumarate er], and Voltaren [diclofenac sodium]   Review of Systems Review of Systems  Cardiovascular: Negative for chest pain.  Neurological: Negative for headaches.  All other systems reviewed and are negative.    Physical Exam Updated Vital Signs BP (!) 163/75 (BP Location: Left Arm)   Pulse 80   Temp 97.6 F (36.4 C) (Oral)   Resp 12   Ht '5\' 2"'  (1.575 m)   Wt 46.1 kg   SpO2 100%   BMI 18.58 kg/m   Physical Exam Vitals signs and nursing note reviewed.  Constitutional:      Appearance: She is well-developed.  HENT:     Head: Normocephalic and atraumatic.     Mouth/Throat:     Mouth: Mucous membranes are dry.     Pharynx: Oropharynx is clear.  Eyes:     Extraocular Movements: Extraocular movements intact.     Conjunctiva/sclera: Conjunctivae normal.  Neck:     Musculoskeletal: Normal range of motion.  Cardiovascular:     Rate and Rhythm: Normal rate and regular rhythm.     Heart sounds: No murmur.  Pulmonary:     Effort: No respiratory distress.     Breath sounds: No stridor.  Abdominal:     General: There is no distension.  Musculoskeletal: Normal range of motion.        General: Tenderness (at bilateral hips) present. No swelling.  Skin:    General: Skin is warm and dry.   Neurological:     General: No focal deficit present.     Mental Status: She is alert.      ED Treatments / Results  Labs (all labs ordered are listed, but only abnormal results are displayed) Labs Reviewed  CBC WITH DIFFERENTIAL/PLATELET - Abnormal; Notable for the following components:      Result Value   RBC 3.08 (*)    Hemoglobin 10.3 (*)    HCT 31.4 (*)    MCV 101.9 (*)    All other components within normal limits  BASIC METABOLIC PANEL - Abnormal; Notable for the following components:   Potassium 3.3 (*)    Glucose, Bld 104 (*)    All other components within normal limits  URINALYSIS, ROUTINE W REFLEX MICROSCOPIC - Abnormal; Notable for the following components:   Color, Urine STRAW (*)    Nitrite POSITIVE (*)    Bacteria, UA RARE (*)    All other components within normal limits  URINE  CULTURE    EKG None  Radiology Dg Chest 2 View  Result Date: 05/04/2019 CLINICAL DATA:  85 year old female status post fall. EXAM: CHEST - 2 VIEW COMPARISON:  Chest radiographs 04/28/2018 and earlier. FINDINGS: AP supine and supine lateral views of the chest. Stable mediastinal contours. Prior CABG. Visualized tracheal air column is within normal limits. Lower lung volumes. No pneumothorax, pulmonary edema, pleural effusion or acute pulmonary opacity. Calcified aortic atherosclerosis. Osteopenia. Stable visualized osseous structures. Paucity of bowel gas in the upper abdomen. IMPRESSION: 1. No acute cardiopulmonary abnormality or acute traumatic injury identified. 2. Aortic Atherosclerosis (ICD10-I70.0). Electronically Signed   By: Genevie Ann M.D.   On: 05/04/2019 04:57   Dg Hips Bilat W Or Wo Pelvis 3-4 Views  Result Date: 05/04/2019 CLINICAL DATA:  85 year old female status post fall. EXAM: DG HIP (WITH OR WITHOUT PELVIS) 3-4V BILAT COMPARISON:  Left hip series 06/30/2018. Left hip MRI 06/30/2018. FINDINGS: New, elongated 6-7 centimeter confluent soft tissue calcification along the medial  proximal left femur is felt to be myositis ossificans related to the injury of the iliopsoas muscle insertion demonstrated by MRI in January. The femoral heads remain normally located. Hip joint spaces appear stable. No superimposed proximal left femur fracture identified. Elsewhere the pelvis appears stable and intact. Chronic left inguinal surgical clips. Proximal right femur appears intact. Negative visible bowel gas pattern. SI joints remain normal. IMPRESSION: 1. 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 2. No superimposed acute fracture or dislocation identified about the bilateral hips or pelvis. If occult hip fracture is suspected or if the patient is unable to weightbear, MRI is the preferred modality for further evaluation. Electronically Signed   By: Genevie Ann M.D.   On: 05/04/2019 04:55    Procedures Procedures (including critical care time)  Medications Ordered in ED Medications - No data to display   Initial Impression / Assessment and Plan / ED Course  I have reviewed the triage vital signs and the nursing notes.  Pertinent labs & imaging results that were available during my care of the patient were reviewed by me and considered in my medical decision making (see chart for details).  No obvious bony injuries. Culture on urine sent, will hold on treatment without leukocytes in urine.   Final Clinical Impressions(s) / ED Diagnoses   Final diagnoses:  Fall, initial encounter  Hip pain    ED Discharge Orders    None       Caileigh Canche, Corene Cornea, MD 05/04/19 [phone]

## 2019-05-04 NOTE — ED Notes (Signed)
Patient transported to radiology

## 2019-05-04 NOTE — ED Notes (Signed)
Patients daughter updated via telephone by Night shift RN Lovena Le. Patient's daughter to contact facility for patient transport home.

## 2019-05-04 NOTE — Telephone Encounter (Signed)
DONE

## 2019-05-04 NOTE — Progress Notes (Signed)
Office Visit Note   Patient: [name]           Date of Birth: 1931/06/30           MRN: [id number] Visit Date: 05/02/2019 Requested by: [place],  [place] [address] PCP: [facility]  Subjective: Chief Complaint  Patient presents with   Lower Back - Pain   Groin Pain    HPI: Patient presents with back pain and right hip pain.  She comes with x-ray reports from another facility which shows new L3 compression fracture and moderate arthritis in the right hip.  Patient has developed a little bit of mild dementia since have seen her.  I did talk with her daughter as well.  She states that she developed back pain after doing chair yoga in early November.  MRI scan from January of this year showed severe spinal stenosis but no L3 compression fracture.  She has developed a new L3 compression fracture based on review of studies.  The nurse practitioner has ordered her calcitonin which will start on 1125.  She has had multiple courses of antibiotics for urinary tract infection which may or may not have been successful.  Hurts her to bend over.  She does report some weakness in her legs.  Pain radiates down her legs.  She has been using a rolling walker.              ROS: All systems reviewed are negative as they relate to the chief complaint within the history of present illness.  Patient denies  fevers or chills.   Assessment & Plan: Visit Diagnoses:  1. Low back pain, unspecified back pain laterality, unspecified chronicity, unspecified whether sciatica present     Plan: Impression is worsening spinal stenosis with new L3 compression fracture.  Initially before I talked with the daughter that there is no history of injury but after talking with the daughter this in class chair yoga could have been the culprit.  She also has some right hip arthritis.  I would favor observation for 3 to 4 weeks with to see if the calcitonin  works.  We will also get her a lumbar corset with rigid stays.  See if that can help.  Come back in 4 weeks for repeat clinical check.  Decision point for or against MRI scanning really depends on whether or not we believe this is a nonosteoporotic pathologic fracture.  She has continued symptoms and continued collapse we will be forced to get the MRI scan.  For now she will need a lateral x-ray on return.  I think the hip is also potentially at play.  Would favor observation of the hip for now with possible intra-articular steroid injections to be done as well if needed.  Follow-Up Instructions: Return in about 4 weeks (around 05/30/2019).   Orders:  Orders Placed This Encounter  Procedures   MR Lumbar Spine w/o contrast   No orders of the defined types were placed in this encounter.     Procedures: No procedures performed   Clinical Data: No additional findings.  Objective: Vital Signs: There were no vitals taken for this visit.  Physical Exam:   Constitutional: Patient appears well-developed HEENT:  Head: Normocephalic Eyes:EOM are normal Neck: Normal range of motion Cardiovascular: Normal rate Pulmonary/chest: Effort normal Neurologic: Patient is alert Skin: Skin is warm Psychiatric: Patient has normal mood and affect    Ortho Exam:  Ortho exam demonstrates mild groin pain on the right internal X rotation of the leg with no groin pain on the left.  She does have reasonable ankle dorsiflexion plantarflexion quad hamstring strength.  No saddle paresthesias.  No effusion in either knee.  Reflexes symmetric with negative Babinski.  No definite paresthesias L1 S1 bilaterally.  Does have pain with forward lateral bending and it is difficult for her to get up from a chair.  She has little bit of hip flexion weakness as well more on the right than the left.  Specialty Comments:  No specialty comments available.  Imaging: Dg Chest 2 View  Result Date: 05/04/2019 CLINICAL  DATA:  85 year old female status post fall. EXAM: CHEST - 2 VIEW COMPARISON:  Chest radiographs 04/28/2018 and earlier. FINDINGS: AP supine and supine lateral views of the chest. Stable mediastinal contours. Prior CABG. Visualized tracheal air column is within normal limits. Lower lung volumes. No pneumothorax, pulmonary edema, pleural effusion or acute pulmonary opacity. Calcified aortic atherosclerosis. Osteopenia. Stable visualized osseous structures. Paucity of bowel gas in the upper abdomen. IMPRESSION: 1. No acute cardiopulmonary abnormality or acute traumatic injury identified. 2. Aortic Atherosclerosis (ICD10-I70.0). Electronically Signed   By: Genevie Ann M.D.   On: 05/04/2019 04:57   Dg Hips Bilat W Or Wo Pelvis 3-4 Views  Result Date: 05/04/2019 CLINICAL DATA:  85 year old female status post fall. EXAM: DG HIP (WITH OR WITHOUT PELVIS) 3-4V BILAT COMPARISON:  Left hip series 06/30/2018. Left hip MRI 06/30/2018. FINDINGS: New, elongated 6-7 centimeter confluent soft tissue calcification along the medial proximal left femur is felt to be myositis ossificans related to the injury of the iliopsoas muscle insertion demonstrated by MRI in January. The femoral heads remain normally located. Hip joint spaces appear stable. No superimposed proximal left femur fracture identified. Elsewhere the pelvis appears stable and intact. Chronic left inguinal surgical clips. Proximal right femur appears intact. Negative visible bowel gas pattern. SI joints remain normal. IMPRESSION: 1. 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 2. No superimposed acute fracture or dislocation identified about the bilateral hips or pelvis. If occult hip fracture is suspected or if the patient is unable to weightbear, MRI is the preferred modality for further evaluation. Electronically Signed   By: Genevie Ann M.D.   On: 05/04/2019 04:55      PMFS History: Patient Active Problem List   Diagnosis Date Noted   Left hip pain 06/30/2018   Disc degeneration, lumbar 06/23/2018   Left thyroid nodule 08/29/2017   Chronic idiopathic constipation 08/18/2017   Abdominal pain 07/31/2017   Mesenteric ischemia ([facility]) 05/04/2017   Unsteadiness on feet    Unstable angina pectoris (HCC)    Tinea pedis    Spinal stenosis of lumbar region    Somnolence    Seasonal allergic rhinitis    Restless legs syndrome    Raynaud's syndrome    Raynauds syndrome    Radiculopathy    Panic disorder    Ovarian cyst, bilateral    Osteopenia    Myocardial infarction (HCC)    Muscle weakness (generalized)    Mixed incontinence    Mild chronic anemia    Major depressive disorder    Long term (current) use of aspirin    Intermediate coronary syndrome (HCC)    Incontinent of urine    IBS (irritable bowel syndrome)    Hypercholesteremia    Hx of CABG  GERD (gastroesophageal reflux disease)    Dementia (HCC)    Chest pain    Atherosclerotic heart disease of native coronary artery without angina pectoris    Arthritis    Anxiety    Allergic rhinitis    Abnormal weight loss    History of adenomatous polyp of colon 03/02/2017   NSTEMI (non-ST elevated myocardial infarction) ([facility]) 02/02/2017   SOB (shortness of breath) 09/02/2016   Dizziness 09/02/2016   Non-ST elevation (NSTEMI) myocardial infarction ([facility]) 11/26/2015   Polymyalgia rheumatica ([facility]) 11/26/2015   Hypokalemia 11/26/2015   Mild persistent asthma 11/12/2015   Coronary artery disease involving autologous artery coronary bypass graft with angina pectoris with documented spasm ([place]) 11/12/2015   Multiple myeloma in remission ([facility]) 11/12/2015   Current use of beta blocker 11/12/2015   Gastroesophageal reflux disease without esophagitis 11/12/2015   Other allergic rhinitis 11/12/2015   Weight loss, non-intentional 10/03/2015    Asthma 09/24/2015   Asthma in adult without complication 81/19/1478   Seasonal allergic rhinitis due to pollen 09/24/2015   Cervical radiculopathy 07/25/2015   Chronic coronary artery disease 07/25/2015   Coronary artery disease 07/25/2015   Disturbance in sleep behavior 07/25/2015   Elevated CK 07/25/2015   Elevated creatine kinase level 07/25/2015   Generalized osteoarthritis of hand 07/25/2015   Other long term (current) drug therapy 07/25/2015   Overactive bladder 07/25/2015   Restless leg 07/25/2015   Restless leg syndrome 07/25/2015   Sleep disturbances 07/25/2015   Idiopathic peripheral neuropathy 07/04/2015   Lumbar radiculopathy, chronic 07/04/2015   Mixed dementia ([facility]) 07/04/2015   Risk for falls 07/04/2015   Sleep disturbance 07/04/2015   Chest pain with moderate risk for cardiac etiology 06/13/2015   Right sciatic nerve pain 10/19/2014   Abnormal thyroid blood test 08/25/2014   Chronic fatigue 08/09/2014   Multiple myeloma ([place]) 07/21/2013   Depression 01/14/2013   Generalized anxiety disorder 01/14/2013   Gastroesophageal reflux disease 01/13/2013   Urine test positive for microalbuminuria 12/08/2011   Cyst of ovary 12/17/2010   KNEE PAIN, BILATERAL 08/15/2010   Abnormal gait 08/15/2010   Atherosclerosis of coronary artery bypass graft 10/11/2009   CHEST PAIN, NON-CARDIAC 10/11/2009   HIP PAIN, RIGHT 08/13/2009   GREATER TROCHANTERIC BURSITIS 08/13/2009   LUMBOSACRAL SPONDYLOSIS WITHOUT MYELOPATHY 11/10/2008   Osteoarthritis of lumbosacral spine without myelopathy 11/10/2008   FOOT PAIN, BILATERAL 06/30/2008   Hyperlipidemia 06/22/2008   Hypertension 06/22/2008   Raynaud's disease 06/22/2008   BURSITIS, LEFT SHOULDER 06/22/2008   Past Medical History:  Diagnosis Date   Anxiety    Arthritis    CAD (coronary artery disease)    Chondrocostal junction syndrome    Chronic fatigue    Dementia (HCC)    without  Behavioral Disturbance   GERD (gastroesophageal reflux disease)    Hx of CABG    Hyperlipidemia    GOAL LDL <70   Hypertension    IBS (irritable bowel syndrome)    Jaundice    yellow jaundice 3rd grade   Major depressive disorder    Mild chronic anemia    Mixed incontinence    Multiple myeloma ([facility])    not having achieved remission   NSTEMI (non-ST elevated myocardial infarction) ([facility]) 01/31/2017   Osteopenia    Ovarian cyst, bilateral    Rt complex   Panic disorder    Pulmonary nodules    Radiculopathy    Raynaud's syndrome    Restless legs syndrome    Spinal stenosis of lumbar region  Thyroid nodule     Family History  Problem Relation Age of Onset   Heart attack Mother    CAD Mother    Obesity Sister    Breast cancer Paternal Aunt    Ulcers Father    Asthma Neg Hx    Eczema Neg Hx    Urticaria Neg Hx    Allergic rhinitis Neg Hx    Angioedema Neg Hx    Atopy Neg Hx    Immunodeficiency Neg Hx     Past Surgical History:  Procedure Laterality Date   BUNIONECTOMY WITH HAMMERTOE RECONSTRUCTION Right 08/04/2012   Procedure:  HAMMERTOE RECONSTRUCTION;  Surgeon: Colin Rhein, MD;  Location: [facility];  Service: Orthopedics;  Laterality: Right;  HAMMER TOE RECONSTRUCTION PROBABLE FLEXOR DIGITORUM LONGUS TO PROXIMAL PHALANX TRANSFER LATERALIZED    CAPSULOTOMY Right 08/04/2012   Procedure: CAPSULOTOMY;  Surgeon: Colin Rhein, MD;  Location: [facility];  Service: Orthopedics;  Laterality: Right;  RIGHT 2ND TOE METATARSOPHALANGEAL JOINT DORSAL CAPSULOTOMY    CATARACT EXTRACTION, BILATERAL     both cataracts   COLONOSCOPY     CORONARY ANGIOPLASTY WITH STENT PLACEMENT  2014   2 stents   CORONARY ARTERY BYPASS GRAFT  2007   LIMA to LAD, SVG to RCA  non-ST segment MI 08/2009 (Dr. Tamala Julian)   CORONARY STENT INTERVENTION N/A 02/02/2017   Procedure: CORONARY STENT INTERVENTION;  Surgeon: Belva Crome, MD;   Location: [facility];  Service: Cardiovascular;  Laterality: N/A;  Prox CFX   DILATION AND CURETTAGE OF UTERUS     LEFT HEART CATH AND CORS/GRAFTS ANGIOGRAPHY N/A 02/02/2017   Procedure: LEFT HEART CATH AND CORS/GRAFTS ANGIOGRAPHY;  Surgeon: Belva Crome, MD;  Location: [facility];  Service: Cardiovascular;  Laterality: N/A;   LEFT HEART CATHETERIZATION WITH CORONARY/GRAFT ANGIOGRAM N/A 07/09/2012   Procedure: LEFT HEART CATHETERIZATION WITH Beatrix Fetters;  Surgeon: Sinclair Grooms, MD;  Location[facility];  Service: Cardiovascular;  Laterality: N/A;   TONSILLECTOMY AND ADENOIDECTOMY     UPPER GASTROINTESTINAL ENDOSCOPY     VISCERAL ANGIOGRAPHY N/A 04/15/2017   Procedure: VISCERAL ANGIOGRAPHY;  Surgeon: Waynetta Sandy, MD;  Location: [facility];  Service: Cardiovascular;  Laterality: N/A;   Social History   Occupational History   Occupation: homemaker    Occupation: Materials engineer education  Tobacco Use   Smoking status: Never Smoker   Smokeless tobacco: Never Used  Substance and Sexual Activity   Alcohol use: Never    Frequency: Never   Drug use: No   Sexual activity: Never

## 2019-05-04 NOTE — Telephone Encounter (Signed)
Please see note. Can you please cancel MRI order.

## 2019-05-04 NOTE — ED Notes (Signed)
Patient transported back to [facility]isted Living facility via the facilities personal transportation [place]. Patient assisted to stand, take a few steps, pivot, and sit in wheelchair, then the same into [place]. Patient in pain, but able to bear weight and stand, take a few steps, and pivot. Patient sent to facility in donated clothing - patient's urine soaked night gown and robe sent in belongings bag. Patient's x2 pillows sent with patient in [place]. Patient's D/C paperwork and DNR sent with Lucianne Lei driver. Patient AxOx4 and thanked this RN for her help

## 2019-05-04 NOTE — Telephone Encounter (Signed)
I called and talked to the patient's daughter.  It appears that she had some type of injury doing chair yoga in early November.  That explains the compression fracture.  I would favor holding off on the MRI scan and can you order her a lumbar corset with rigid stays to be used which will help.  The nurse practitioner is ordering calcitonin.  Hold off on MRI scan and we will see her back in about 4 weeks for clinical recheck and decide then for or against MRI scan.  Thanks

## 2019-05-05 DIAGNOSIS — M25559 Pain in unspecified hip: Secondary | ICD-10-CM | POA: Diagnosis not present

## 2019-05-05 DIAGNOSIS — W19XXXA Unspecified fall, initial encounter: Secondary | ICD-10-CM | POA: Diagnosis not present

## 2019-05-05 DIAGNOSIS — M545 Low back pain: Secondary | ICD-10-CM | POA: Diagnosis not present

## 2019-05-06 DIAGNOSIS — M25551 Pain in right hip: Secondary | ICD-10-CM | POA: Diagnosis not present

## 2019-05-06 DIAGNOSIS — R5382 Chronic fatigue, unspecified: Secondary | ICD-10-CM | POA: Diagnosis not present

## 2019-05-06 DIAGNOSIS — R262 Difficulty in walking, not elsewhere classified: Secondary | ICD-10-CM | POA: Diagnosis not present

## 2019-05-06 DIAGNOSIS — M5431 Sciatica, right side: Secondary | ICD-10-CM | POA: Diagnosis not present

## 2019-05-06 LAB — URINE CULTURE: Culture: >=100000 — AB

## 2019-05-07 ENCOUNTER — Encounter (HOSPITAL_COMMUNITY): Payer: Self-pay | Admitting: Pharmacist

## 2019-05-07 ENCOUNTER — Telehealth: Payer: Self-pay | Admitting: Emergency Medicine

## 2019-05-07 NOTE — Telephone Encounter (Signed)
Post ED Visit - Positive Culture Follow-up  Culture report reviewed by antimicrobial stewardship pharmacist: [facility] Team []  Elenor Quinones, Pharm.D. []  Heide Guile, Pharm.D., BCPS AQ-ID []  Parks Neptune, Pharm.D., BCPS []  Alycia Rossetti, Pharm.D., BCPS []  [place], [place].D., BCPS, AAHIVP []  Legrand Como, Pharm.D., BCPS, AAHIVP []  Salome Arnt, PharmD, BCPS []  Johnnette Gourd, PharmD, BCPS []  Hughes Better, PharmD, BCPS []  Leeroy Cha, PharmD []  Laqueta Linden, PharmD, BCPS []  Albertina Parr, PharmD  [facility] Team []  Leodis Sias, PharmD []  Lindell Spar, PharmD []  Royetta Asal, PharmD []  Graylin Shiver, Rph []  Rema Fendt) Glennon Mac, PharmD []  Arlyn Dunning, PharmD []  Netta Cedars, PharmD []  Dia Sitter, PharmD []  Leone Haven, PharmD []  Gretta Arab, PharmD []  Theodis Shove, PharmD []  Peggyann Juba, PharmD [x]  Reuel Boom, PharmD   Positive urine culture  no further patient follow-up is required at this time.  Sandi Raveling Nelle Sayed 05/07/2019, 2:03 PM

## 2019-05-09 ENCOUNTER — Encounter: Payer: Self-pay | Admitting: Family Medicine

## 2019-05-09 DIAGNOSIS — M5431 Sciatica, right side: Secondary | ICD-10-CM | POA: Diagnosis not present

## 2019-05-09 DIAGNOSIS — M25551 Pain in right hip: Secondary | ICD-10-CM | POA: Diagnosis not present

## 2019-05-09 DIAGNOSIS — R262 Difficulty in walking, not elsewhere classified: Secondary | ICD-10-CM | POA: Diagnosis not present

## 2019-05-10 NOTE — Progress Notes (Signed)
I reviewed note and agree with plan.   Penni Bombard, MD [id number], [id number] PM Certified in Neurology, Neurophysiology and Neuroimaging  [facility] [address] [place], [place] [address] [phone]

## 2019-05-11 DIAGNOSIS — R262 Difficulty in walking, not elsewhere classified: Secondary | ICD-10-CM | POA: Diagnosis not present

## 2019-05-11 DIAGNOSIS — M25551 Pain in right hip: Secondary | ICD-10-CM | POA: Diagnosis not present

## 2019-05-11 DIAGNOSIS — M5431 Sciatica, right side: Secondary | ICD-10-CM | POA: Diagnosis not present

## 2019-05-12 ENCOUNTER — Other Ambulatory Visit: Payer: Self-pay | Admitting: Orthopedic Surgery

## 2019-05-12 ENCOUNTER — Telehealth: Payer: Self-pay | Admitting: Orthopedic Surgery

## 2019-05-12 DIAGNOSIS — M5440 Lumbago with sciatica, unspecified side: Secondary | ICD-10-CM

## 2019-05-12 NOTE — Telephone Encounter (Signed)
Please advise. Was brace supposed to be ordered?

## 2019-05-12 NOTE — Telephone Encounter (Signed)
Patient's daughter [name] called left voicemail message asking for a call back concerning the back brace ordered for her mother. [name] said the brace is needed. The number to contact [name] is [phone]

## 2019-05-13 DIAGNOSIS — R262 Difficulty in walking, not elsewhere classified: Secondary | ICD-10-CM | POA: Diagnosis not present

## 2019-05-13 DIAGNOSIS — M5431 Sciatica, right side: Secondary | ICD-10-CM | POA: Diagnosis not present

## 2019-05-13 DIAGNOSIS — M25551 Pain in right hip: Secondary | ICD-10-CM | POA: Diagnosis not present

## 2019-05-13 DIAGNOSIS — E876 Hypokalemia: Secondary | ICD-10-CM | POA: Diagnosis not present

## 2019-05-13 NOTE — Telephone Encounter (Signed)
IC s/w her daughter and advised

## 2019-05-13 NOTE — Telephone Encounter (Signed)
I would just go with lumbar corset with rigid stays that would be great thanks

## 2019-05-16 DIAGNOSIS — M25551 Pain in right hip: Secondary | ICD-10-CM | POA: Diagnosis not present

## 2019-05-16 DIAGNOSIS — M5431 Sciatica, right side: Secondary | ICD-10-CM | POA: Diagnosis not present

## 2019-05-16 DIAGNOSIS — R262 Difficulty in walking, not elsewhere classified: Secondary | ICD-10-CM | POA: Diagnosis not present

## 2019-05-17 ENCOUNTER — Ambulatory Visit (INDEPENDENT_AMBULATORY_CARE_PROVIDER_SITE_OTHER): Payer: PPO

## 2019-05-17 ENCOUNTER — Other Ambulatory Visit: Payer: Self-pay

## 2019-05-17 DIAGNOSIS — M5440 Lumbago with sciatica, unspecified side: Secondary | ICD-10-CM | POA: Diagnosis not present

## 2019-05-17 DIAGNOSIS — M545 Low back pain: Secondary | ICD-10-CM | POA: Diagnosis not present

## 2019-05-17 DIAGNOSIS — S3992XA Unspecified injury of lower back, initial encounter: Secondary | ICD-10-CM | POA: Diagnosis not present

## 2019-05-17 DIAGNOSIS — Z9181 History of falling: Secondary | ICD-10-CM | POA: Diagnosis not present

## 2019-05-17 DIAGNOSIS — M6281 Muscle weakness (generalized): Secondary | ICD-10-CM | POA: Diagnosis not present

## 2019-05-17 DIAGNOSIS — R278 Other lack of coordination: Secondary | ICD-10-CM | POA: Diagnosis not present

## 2019-05-18 ENCOUNTER — Observation Stay (HOSPITAL_COMMUNITY): Payer: PPO

## 2019-05-18 ENCOUNTER — Encounter (HOSPITAL_COMMUNITY): Payer: Self-pay | Admitting: Emergency Medicine

## 2019-05-18 ENCOUNTER — Observation Stay (HOSPITAL_COMMUNITY)
Admission: EM | Admit: 2019-05-18 | Discharge: 2019-05-20 | Disposition: A | Discharge status: Skilled Nursing Facility | Payer: PPO | Attending: Internal Medicine | Admitting: Internal Medicine

## 2019-05-18 ENCOUNTER — Other Ambulatory Visit: Payer: Self-pay

## 2019-05-18 DIAGNOSIS — R609 Edema, unspecified: Secondary | ICD-10-CM | POA: Insufficient documentation

## 2019-05-18 DIAGNOSIS — M25552 Pain in left hip: Secondary | ICD-10-CM | POA: Diagnosis not present

## 2019-05-18 DIAGNOSIS — Z20828 Contact with and (suspected) exposure to other viral communicable diseases: Secondary | ICD-10-CM | POA: Diagnosis not present

## 2019-05-18 DIAGNOSIS — J4530 Mild persistent asthma, uncomplicated: Secondary | ICD-10-CM | POA: Insufficient documentation

## 2019-05-18 DIAGNOSIS — Z87440 Personal history of urinary (tract) infections: Secondary | ICD-10-CM | POA: Diagnosis not present

## 2019-05-18 DIAGNOSIS — N390 Urinary tract infection, site not specified: Secondary | ICD-10-CM | POA: Diagnosis not present

## 2019-05-18 DIAGNOSIS — R410 Disorientation, unspecified: Secondary | ICD-10-CM

## 2019-05-18 DIAGNOSIS — F410 Panic disorder [episodic paroxysmal anxiety] without agoraphobia: Secondary | ICD-10-CM | POA: Diagnosis not present

## 2019-05-18 DIAGNOSIS — R4182 Altered mental status, unspecified: Principal | ICD-10-CM | POA: Diagnosis present

## 2019-05-18 DIAGNOSIS — F05 Delirium due to known physiological condition: Secondary | ICD-10-CM | POA: Insufficient documentation

## 2019-05-18 DIAGNOSIS — J301 Allergic rhinitis due to pollen: Secondary | ICD-10-CM | POA: Diagnosis not present

## 2019-05-18 DIAGNOSIS — R404 Transient alteration of awareness: Secondary | ICD-10-CM | POA: Diagnosis not present

## 2019-05-18 DIAGNOSIS — F0390 Unspecified dementia without behavioral disturbance: Secondary | ICD-10-CM | POA: Diagnosis present

## 2019-05-18 DIAGNOSIS — E785 Hyperlipidemia, unspecified: Secondary | ICD-10-CM | POA: Diagnosis not present

## 2019-05-18 DIAGNOSIS — I10 Essential (primary) hypertension: Secondary | ICD-10-CM | POA: Diagnosis not present

## 2019-05-18 DIAGNOSIS — D649 Anemia, unspecified: Secondary | ICD-10-CM | POA: Insufficient documentation

## 2019-05-18 DIAGNOSIS — Z66 Do not resuscitate: Secondary | ICD-10-CM | POA: Insufficient documentation

## 2019-05-18 DIAGNOSIS — M353 Polymyalgia rheumatica: Secondary | ICD-10-CM | POA: Diagnosis not present

## 2019-05-18 DIAGNOSIS — Z8249 Family history of ischemic heart disease and other diseases of the circulatory system: Secondary | ICD-10-CM | POA: Insufficient documentation

## 2019-05-18 DIAGNOSIS — I2510 Atherosclerotic heart disease of native coronary artery without angina pectoris: Secondary | ICD-10-CM | POA: Diagnosis not present

## 2019-05-18 DIAGNOSIS — C9000 Multiple myeloma not having achieved remission: Secondary | ICD-10-CM | POA: Diagnosis not present

## 2019-05-18 DIAGNOSIS — S79912A Unspecified injury of left hip, initial encounter: Secondary | ICD-10-CM | POA: Diagnosis not present

## 2019-05-18 DIAGNOSIS — W19XXXA Unspecified fall, initial encounter: Secondary | ICD-10-CM

## 2019-05-18 DIAGNOSIS — F329 Major depressive disorder, single episode, unspecified: Secondary | ICD-10-CM | POA: Insufficient documentation

## 2019-05-18 DIAGNOSIS — I252 Old myocardial infarction: Secondary | ICD-10-CM | POA: Insufficient documentation

## 2019-05-18 DIAGNOSIS — S79911A Unspecified injury of right hip, initial encounter: Secondary | ICD-10-CM | POA: Diagnosis not present

## 2019-05-18 DIAGNOSIS — Z9181 History of falling: Secondary | ICD-10-CM | POA: Insufficient documentation

## 2019-05-18 DIAGNOSIS — G609 Hereditary and idiopathic neuropathy, unspecified: Secondary | ICD-10-CM | POA: Diagnosis not present

## 2019-05-18 DIAGNOSIS — R531 Weakness: Secondary | ICD-10-CM | POA: Diagnosis not present

## 2019-05-18 DIAGNOSIS — M5416 Radiculopathy, lumbar region: Secondary | ICD-10-CM | POA: Diagnosis not present

## 2019-05-18 DIAGNOSIS — Z743 Need for continuous supervision: Secondary | ICD-10-CM | POA: Diagnosis not present

## 2019-05-18 DIAGNOSIS — M25551 Pain in right hip: Secondary | ICD-10-CM | POA: Diagnosis not present

## 2019-05-18 DIAGNOSIS — Z7982 Long term (current) use of aspirin: Secondary | ICD-10-CM | POA: Diagnosis not present

## 2019-05-18 DIAGNOSIS — K219 Gastro-esophageal reflux disease without esophagitis: Secondary | ICD-10-CM | POA: Diagnosis not present

## 2019-05-18 DIAGNOSIS — Z79899 Other long term (current) drug therapy: Secondary | ICD-10-CM | POA: Diagnosis not present

## 2019-05-18 DIAGNOSIS — I7300 Raynaud's syndrome without gangrene: Secondary | ICD-10-CM | POA: Diagnosis not present

## 2019-05-18 DIAGNOSIS — Z79890 Hormone replacement therapy (postmenopausal): Secondary | ICD-10-CM | POA: Insufficient documentation

## 2019-05-18 LAB — CBC WITH DIFFERENTIAL/PLATELET
Abs Immature Granulocytes: 0.01 10*3/uL (ref 0.00–0.07)
Basophils Absolute: 0 10*3/uL (ref 0.0–0.1)
Basophils Relative: 0 %
Eosinophils Absolute: 0 10*3/uL (ref 0.0–0.5)
Eosinophils Relative: 1 %
HCT: 33.6 % — ABNORMAL LOW (ref 36.0–46.0)
Hemoglobin: 11.2 g/dL — ABNORMAL LOW (ref 12.0–15.0)
Immature Granulocytes: 0 %
Lymphocytes Relative: 18 %
Lymphs Abs: 0.9 10*3/uL (ref 0.7–4.0)
MCH: 33.2 pg (ref 26.0–34.0)
MCHC: 33.3 g/dL (ref 30.0–36.0)
MCV: 99.7 fL (ref 80.0–100.0)
Monocytes Absolute: 0.5 10*3/uL (ref 0.1–1.0)
Monocytes Relative: 10 %
Neutro Abs: 3.5 10*3/uL (ref 1.7–7.7)
Neutrophils Relative %: 71 %
Platelets: 442 10*3/uL — ABNORMAL HIGH (ref 150–400)
RBC: 3.37 MIL/uL — ABNORMAL LOW (ref 3.87–5.11)
RDW: 13.1 % (ref 11.5–15.5)
WBC: 4.9 10*3/uL (ref 4.0–10.5)
nRBC: 0 % (ref 0.0–0.2)

## 2019-05-18 LAB — BASIC METABOLIC PANEL
Anion gap: 13 (ref 5–15)
BUN: 14 mg/dL (ref 8–23)
CO2: 25 mmol/L (ref 22–32)
Calcium: 9.3 mg/dL (ref 8.9–10.3)
Chloride: 94 mmol/L — ABNORMAL LOW (ref 98–111)
Creatinine, Ser: 0.55 mg/dL (ref 0.44–1.00)
GFR calc Af Amer: >60 mL/min (ref >60)
GFR calc non Af Amer: >60 mL/min (ref >60)
Glucose, Bld: 98 mg/dL (ref 70–99)
Potassium: 3.3 mmol/L — ABNORMAL LOW (ref 3.5–5.1)
Sodium: 132 mmol/L — ABNORMAL LOW (ref 135–145)

## 2019-05-18 LAB — WET PREP, GENITAL
Clue Cells Wet Prep HPF POC: NONE SEEN
Sperm: NONE SEEN
Trich, Wet Prep: NONE SEEN
Yeast Wet Prep HPF POC: NONE SEEN

## 2019-05-18 LAB — SARS CORONAVIRUS 2 (TAT 6-24 HRS): SARS Coronavirus 2: NEGATIVE

## 2019-05-18 LAB — URINALYSIS, ROUTINE W REFLEX MICROSCOPIC
Bilirubin Urine: NEGATIVE
Glucose, UA: NEGATIVE mg/dL
Ketones, ur: NEGATIVE mg/dL
Leukocytes,Ua: NEGATIVE
Nitrite: POSITIVE — AB
Protein, ur: 30 mg/dL — AB
Specific Gravity, Urine: 1.011 (ref 1.005–1.030)
pH: 8 (ref 5.0–8.0)

## 2019-05-18 MED ORDER — CHOLESTYRAMINE 4 G PO PACK
2.0000 g | PACK | ORAL | Status: DC
  Filled 2019-05-18: qty 1

## 2019-05-18 MED ORDER — SERTRALINE HCL 50 MG PO TABS
50.0000 mg | ORAL_TABLET | Freq: Every day | ORAL | Status: DC
  Administered 2019-05-18 – 2019-05-19 (×2): 50 mg via ORAL
  Filled 2019-05-18 (×2): qty 1

## 2019-05-18 MED ORDER — POLYETHYLENE GLYCOL 3350 17 G PO PACK
17.0000 g | PACK | Freq: Every day | ORAL | Status: DC
  Administered 2019-05-19 – 2019-05-20 (×2): 17 g via ORAL
  Filled 2019-05-18 (×2): qty 1

## 2019-05-18 MED ORDER — POLYVINYL ALCOHOL 1.4 % OP SOLN: 1.0000 [drp] | Freq: Four times a day (QID) | OPHTHALMIC | Status: DC | PRN

## 2019-05-18 MED ORDER — HYDRALAZINE HCL 10 MG PO TABS
10.0000 mg | ORAL_TABLET | ORAL | Status: DC
  Administered 2019-05-19: 10 mg via ORAL
  Filled 2019-05-18: qty 1

## 2019-05-18 MED ORDER — CARVEDILOL 3.125 MG PO TABS
3.1250 mg | ORAL_TABLET | Freq: Two times a day (BID) | ORAL | Status: DC
  Administered 2019-05-18 – 2019-05-20 (×3): 3.125 mg via ORAL
  Filled 2019-05-18 (×5): qty 1

## 2019-05-18 MED ORDER — ACETAMINOPHEN 325 MG PO TABS: 650.0000 mg | ORAL_TABLET | Freq: Four times a day (QID) | ORAL | Status: DC | PRN

## 2019-05-18 MED ORDER — FAMOTIDINE 20 MG PO TABS
20.0000 mg | ORAL_TABLET | Freq: Two times a day (BID) | ORAL | Status: DC
  Administered 2019-05-18 – 2019-05-20 (×4): 20 mg via ORAL
  Filled 2019-05-18 (×4): qty 1

## 2019-05-18 MED ORDER — PREDNISOLONE ACETATE 1 % OP SUSP: 1.0000 [drp] | Freq: Two times a day (BID) | OPHTHALMIC | Status: DC | PRN

## 2019-05-18 MED ORDER — LIDOCAINE 5 % EX PTCH
1.0000 | MEDICATED_PATCH | CUTANEOUS | Status: DC
  Administered 2019-05-18: 22:00:00 1 via TRANSDERMAL
  Filled 2019-05-18: qty 1

## 2019-05-18 MED ORDER — BACLOFEN 5 MG HALF TABLET
5.0000 mg | ORAL_TABLET | Freq: Three times a day (TID) | ORAL | Status: DC | PRN
  Filled 2019-05-18: qty 1

## 2019-05-18 MED ORDER — HYDRALAZINE HCL 10 MG PO TABS
10.0000 mg | ORAL_TABLET | Freq: Two times a day (BID) | ORAL | Status: DC | PRN
  Filled 2019-05-18: qty 1

## 2019-05-18 MED ORDER — HYDRALAZINE HCL 10 MG PO TABS: 10.0000 mg | ORAL_TABLET | ORAL | Status: DC

## 2019-05-18 MED ORDER — ISOSORBIDE MONONITRATE ER 60 MG PO TB24
60.0000 mg | ORAL_TABLET | Freq: Every day | ORAL | Status: DC
  Administered 2019-05-19 – 2019-05-20 (×2): 60 mg via ORAL
  Filled 2019-05-18 (×2): qty 1

## 2019-05-18 MED ORDER — ONDANSETRON HCL 4 MG/2ML IJ SOLN: 4.0000 mg | Freq: Four times a day (QID) | INTRAMUSCULAR | Status: DC | PRN

## 2019-05-18 MED ORDER — TRAMADOL HCL 50 MG PO TABS: 50.0000 mg | ORAL_TABLET | Freq: Three times a day (TID) | ORAL | Status: DC | PRN

## 2019-05-18 MED ORDER — CYCLOSPORINE 0.05 % OP EMUL
1.0000 [drp] | Freq: Two times a day (BID) | OPHTHALMIC | Status: DC
  Administered 2019-05-19 – 2019-05-20 (×3): 1 [drp] via OPHTHALMIC
  Filled 2019-05-18 (×4): qty 30

## 2019-05-18 MED ORDER — ENOXAPARIN SODIUM 40 MG/0.4ML ~~LOC~~ SOLN
40.0000 mg | SUBCUTANEOUS | Status: DC
  Administered 2019-05-18 – 2019-05-19 (×2): 40 mg via SUBCUTANEOUS
  Filled 2019-05-18 (×2): qty 0.4

## 2019-05-18 MED ORDER — POLYVINYL ALCOHOL 1.4 % OP SOLN
1.0000 [drp] | Freq: Two times a day (BID) | OPHTHALMIC | Status: DC
  Administered 2019-05-19 – 2019-05-20 (×4): 1 [drp] via OPHTHALMIC
  Filled 2019-05-18: qty 15

## 2019-05-18 MED ORDER — SERTRALINE HCL 25 MG PO TABS
25.0000 mg | ORAL_TABLET | Freq: Every day | ORAL | Status: DC
  Administered 2019-05-19 – 2019-05-20 (×2): 25 mg via ORAL
  Filled 2019-05-18 (×2): qty 1

## 2019-05-18 MED ORDER — FOSFOMYCIN TROMETHAMINE 3 G PO PACK
3.0000 g | PACK | Freq: Once | ORAL | Status: AC
  Administered 2019-05-18: 14:00:00 3 g via ORAL
  Filled 2019-05-18: qty 3

## 2019-05-18 MED ORDER — GABAPENTIN 100 MG PO CAPS
100.0000 mg | ORAL_CAPSULE | Freq: Two times a day (BID) | ORAL | Status: DC
  Administered 2019-05-18 – 2019-05-20 (×4): 100 mg via ORAL
  Filled 2019-05-18 (×4): qty 1

## 2019-05-18 MED ORDER — ACETAMINOPHEN 650 MG RE SUPP: 650.0000 mg | Freq: Four times a day (QID) | RECTAL | Status: DC | PRN

## 2019-05-18 MED ORDER — ONDANSETRON HCL 4 MG PO TABS: 4.0000 mg | ORAL_TABLET | Freq: Four times a day (QID) | ORAL | Status: DC | PRN

## 2019-05-18 MED ORDER — ASPIRIN EC 81 MG PO TBEC
81.0000 mg | DELAYED_RELEASE_TABLET | Freq: Every day | ORAL | Status: DC
  Administered 2019-05-19 – 2019-05-20 (×2): 81 mg via ORAL
  Filled 2019-05-18 (×2): qty 1

## 2019-05-18 NOTE — ED Triage Notes (Signed)
Pt to triage via [facility]. Pt from [facility]- woke up last night feeling confused at unknown time.  Sat there "trying to get her mind together" until staff arrived this morning.  Last seen normal around 7pm last night.  Talking to EMS about beach yesterday but also talking about COVID/current events.  Speech clear.  No weakness or facial droop "outside her norm" per EMS.  PT states she is sleepy because she didn't sleep well last night.

## 2019-05-18 NOTE — ED Notes (Signed)
Patient transported to CT 

## 2019-05-18 NOTE — ED Notes (Signed)
Pt back from CT

## 2019-05-18 NOTE — ED Notes (Signed)
Called down for medications verifications

## 2019-05-18 NOTE — H&P (Signed)
History and Physical    [name] YQM:[id number] DOB: [birth year] DOA: 05/18/2019  PCP: [place], [place] Consultants:  Marlou Sa - orthopedics; Hochrein - cardiology; Sherrill - oncology; Pyrtle - GI; Penumalli - neurology Patient coming from: [facility]; NOKSharyn Lull, granddaughter, [phone]  Chief Complaint: confusion  HPI: [name] is a 85 y.o. female with medical history significant of RLS; PMR; panic d/o; CAD s/p CABG; multiple myeloma; depression; HTN; HLD; and dementia presenting with confusion.  She reports having a weird night.  She was waiting for her daughter and she doesn't know how she got there.  She has been in "lock down for 6 months."  "That's probably what's wrong with me, I thought I was losing my mind last night."  She was confused, and had difficulty sleeping.  She "felt like somebody from another world."  Her granddaughter reports that she was up all night.  She currently has a mild ache in her RUQ, adjacent to her right breast.  She reports being diagnosed with a UTI for the last 2 months.  Her primary symptom is a "fishy horrible odor."  No dysuria currently, that is improved.  Also with new weakness that is making it hard to ambulate over the last month or so.  PT has been working on that issue.     ED Course:  H/o recurrent UTIs x 3 months.  11/25 E coli UTI, now with abnormal UA.  Given Fosfomycin.  Also getting head CT - has been altered since a fall 2 weeks ago.  Review of Systems: As per HPI; otherwise review of systems reviewed and negative.   Ambulatory Status:  Ambulates with a walker  Past Medical History:  Diagnosis Date  . Anxiety   . Arthritis   . CAD (coronary artery disease)   . Chondrocostal junction syndrome   . Chronic fatigue   . Dementia ([place])    without Behavioral Disturbance  . GERD (gastroesophageal reflux disease)   . Hx of CABG   . Hyperlipidemia    GOAL LDL <70  . Hypertension   . IBS (irritable bowel  syndrome)   . Jaundice    yellow jaundice 3rd grade  . Major depressive disorder   . Mild chronic anemia   . Mixed incontinence   . Multiple myeloma (HCC)    not having achieved remission  . NSTEMI (non-ST elevated myocardial infarction) ([place]) 01/31/2017  . Osteopenia   . Ovarian cyst, bilateral    Rt complex  . Panic disorder   . Pulmonary nodules   . Raynaud's syndrome   . Restless legs syndrome   . Spinal stenosis of lumbar region   . Thyroid nodule     Past Surgical History:  Procedure Laterality Date  . BUNIONECTOMY WITH HAMMERTOE RECONSTRUCTION Right 08/04/2012   Procedure:  HAMMERTOE RECONSTRUCTION;  Surgeon: Colin Rhein, MD;  Location: [facility];  Service: Orthopedics;  Laterality: Right;  HAMMER TOE RECONSTRUCTION PROBABLE FLEXOR DIGITORUM LONGUS TO PROXIMAL PHALANX TRANSFER LATERALIZED   . CAPSULOTOMY Right 08/04/2012   Procedure: CAPSULOTOMY;  Surgeon: Colin Rhein, MD;  Location: [facility];  Service: Orthopedics;  Laterality: Right;  RIGHT 2ND TOE METATARSOPHALANGEAL JOINT DORSAL CAPSULOTOMY   . CATARACT EXTRACTION, BILATERAL     both cataracts  . COLONOSCOPY    . CORONARY ANGIOPLASTY WITH STENT PLACEMENT  2014   2 stents  . CORONARY ARTERY BYPASS GRAFT  2007   LIMA to LAD, SVG to  RCA  non-ST segment MI 08/2009 (Dr. Tamala Julian)  . CORONARY STENT INTERVENTION N/A 02/02/2017   Procedure: CORONARY STENT INTERVENTION;  Surgeon: Belva Crome, MD;  Location: [place] [facility];  Service: Cardiovascular;  Laterality: N/A;  Prox CFX  . DILATION AND CURETTAGE OF UTERUS    . LEFT HEART CATH AND CORS/GRAFTS ANGIOGRAPHY N/A 02/02/2017   Procedure: LEFT HEART CATH AND CORS/GRAFTS ANGIOGRAPHY;  Surgeon: Belva Crome, MD;  Location: [place] [facility];  Service: Cardiovascular;  Laterality: N/A;  . LEFT HEART CATHETERIZATION WITH CORONARY/GRAFT ANGIOGRAM N/A 07/09/2012   Procedure: LEFT HEART CATHETERIZATION WITH Beatrix Fetters;  Surgeon:  Sinclair Grooms, MD;  Location: [facility] CATH [facility];  Service: Cardiovascular;  Laterality: N/A;  . TONSILLECTOMY AND ADENOIDECTOMY    . UPPER GASTROINTESTINAL ENDOSCOPY    . VISCERAL ANGIOGRAPHY N/A 04/15/2017   Procedure: VISCERAL ANGIOGRAPHY;  Surgeon: Waynetta Sandy, MD;  Location: [place] [facility];  Service: Cardiovascular;  Laterality: N/A;    Social History   Socioeconomic History  . Marital status: Married    Spouse name: Not on file  . Number of children: 4  . Years of education: Not on file  . Highest education level: Not on file  Occupational History  . Occupation: homemaker   . Occupation: Materials engineer education  Social Needs  . Financial resource strain: Not on file  . Food insecurity    Worry: Not on file    Inability: Not on file  . Transportation needs    Medical: Not on file    Non-medical: Not on file  Tobacco Use  . Smoking status: Never Smoker  . Smokeless tobacco: Never Used  Substance and Sexual Activity  . Alcohol use: Never    Frequency: Never  . Drug use: No  . Sexual activity: Never  Lifestyle  . Physical activity    Days per week: Not on file    Minutes per session: Not on file  . Stress: Not on file  Relationships  . Social Herbalist on phone: Not on file    Gets together: Not on file    Attends religious service: Not on file    Active member of club or organization: Not on file    Attends meetings of clubs or organizations: Not on file    Relationship status: Not on file  . Intimate partner violence    Fear of current or ex partner: Not on file    Emotionally abused: Not on file    Physically abused: Not on file    Forced sexual activity: Not on file  Other Topics Concern  . Not on file  Social History Narrative  . Not on file    Allergies  Allergen Reactions  . Butazolidin [Phenylbutazone] Swelling and Rash  . Cephalosporins Other (See Comments)    Site of swelling not recalled  . Levaquin  [Levofloxacin] Shortness Of Breath  . Morphine And Related Other (See Comments)    Hallucinations   . Trimethoprim Nausea Only  . Aleve [Naproxen] Other (See Comments)    Was told by a MD to "never take this"  . Buprenorphine Other (See Comments)    "Allergic," per MAR  . Cilostazol Other (See Comments)    Stomach upset  . Codeine Nausea And Vomiting    Tolerates Tramadol  . Dexilant [Dexlansoprazole] Diarrhea  . Diclofenac Other (See Comments)    Documented on MAR  . Erythromycin Nausea And Vomiting  All "mycin" drugs  . Lactose Intolerance (Gi) Diarrhea  . Macrodantin Other (See Comments)    Double vision Tolerates MacroBID  . Morphine Other (See Comments)    Reaction not recalled  . Simvastatin Other (See Comments)    Muscle aches   . Sulfa Antibiotics Other (See Comments)    "Allergic," per MAR  . Vesicare [Solifenacin] Other (See Comments)    Reaction not recalled by the patient- gets a lot of UTI(s)  . Clindamycin/Lincomycin Other (See Comments)    Upsets the stomach  . Doxycycline Diarrhea  . Food Other (See Comments)    No spicy foods or black pepper, raw onions - causes gerd  . Ibuprofen Hives and Rash    Was told by a MD to "never take this"  . Lincomycin Hcl Nausea Only and Other (See Comments)    Upsets the stomach  . Penicillins Rash    Has patient had a PCN reaction causing immediate rash, facial/tongue/throat swelling, SOB or lightheadedness with hypotension: Yes Has patient had a PCN reaction causing severe rash involving mucus membranes or skin necrosis: No Has patient had a PCN reaction that required hospitalization No Has patient had a PCN reaction occurring within the last 10 years: No If all of the above answers are "NO", then may proceed with Cephalosporin use.   Lisbeth Ply [Fesoterodine Fumarate Er] Other (See Comments)    Reaction not recalled  . Voltaren [Diclofenac Sodium] Rash    Family History  Problem Relation Age of Onset  . Heart  attack Mother   . CAD Mother   . Obesity Sister   . Breast cancer Paternal Aunt   . Ulcers Father   . Asthma Neg Hx   . Eczema Neg Hx   . Urticaria Neg Hx   . Allergic rhinitis Neg Hx   . Angioedema Neg Hx   . Atopy Neg Hx   . Immunodeficiency Neg Hx     Prior to Admission medications   Medication Sig Start Date End Date Taking? Authorizing Provider  acetaminophen (TYLENOL) 650 MG CR tablet Take 1,300 mg by mouth 2 (two) times daily.    [provider]  Alum Hydroxide-Mag Carbonate (GAVISCON EXTRA STRENGTH) [phone] MG/10ML SUSP Take 15 mLs by mouth 4 (four) times daily as needed (for reflux).     [provider]  Artificial Tear Solution (SOOTHE XP) SOLN Place 1 drop into both eyes 2 (two) times daily.    [provider]  aspirin EC 81 MG tablet Take 1 tablet (81 mg total) by mouth daily. 01/21/16   Minus Breeding, MD  baclofen 5 MG TABS Take 5 mg by mouth 3 (three) times daily as needed for muscle spasms. 07/02/18   Debbe Odea, MD  Biotin 5000 MCG SUBL Place 10,000 mcg under the tongue daily.    [provider]  Calcium Carb-Cholecalciferol (CALCIUM 600+D3) 600-200 MG-UNIT TABS Take 1 tablet by mouth 2 (two) times daily.    [provider]  carvedilol (COREG) 3.125 MG tablet Take 1 tablet (3.125 mg total) by mouth 2 (two) times daily with a meal. 01/05/18   Minus Breeding, MD  cholestyramine (QUESTRAN) 4 g packet Take 4 g by mouth as directed. 1/2 pack every 2 days    [provider]  conjugated estrogens (PREMARIN) vaginal cream Place 1 Applicatorful vaginally 2 (two) times a week.    [provider]  Cranberry (ELLURA) 200 MG CAPS Take 200 mg by mouth daily.    [provider]  cycloSPORINE (RESTASIS) 0.05 % ophthalmic emulsion Place 1 drop into both eyes 2 (two) times daily.     [provider]  D-Mannose POWD Take 1 Scoop by mouth daily.    [provider]  docusate sodium (COLACE) 100 MG  capsule Take 100 mg by mouth See admin instructions. Take 100 mg by mouth once a day and an additional 100 mg as needed for constipation    [provider]  famotidine (PEPCID) 20 MG tablet Take 1 tablet (20 mg total) by mouth 2 (two) times daily. 09/24/18   Pyrtle, Lajuan Lines, MD  furosemide (LASIX) 20 MG tablet Take 1 tablet (20 mg total) by mouth daily. 04/29/19 07/28/19  Lendon Colonel, NP  gabapentin (NEURONTIN) 100 MG capsule Take 100 mg by mouth 2 (two) times daily.     [provider]  hydrALAZINE (APRESOLINE) 10 MG tablet Take 1 tablet (10 mg total) by mouth daily at 3 pm. Patient taking differently: Take 10 mg by mouth See admin instructions. Take 10 mg by mouth daily at 2:30 PM and an additional 10 mg two times a day as needed if Systolic reading is >025 42/70/62   Almyra Deforest, PA  hydrALAZINE (APRESOLINE) 25 MG tablet Take 1 tablet (25 mg total) by mouth 2 (two) times daily. 8 am and 10 pm Patient taking differently: Take 25 mg by mouth 2 (two) times daily.  05/26/18   Almyra Deforest, PA  iron polysaccharides (NU-IRON) 150 MG capsule Take 150 mg by mouth 2 (two) times daily.    [provider]  isosorbide mononitrate (IMDUR) 60 MG 24 hr tablet Take 60 mg by mouth daily.    [provider]  lactobacillus acidophilus (BACID) TABS tablet Take 1 tablet by mouth 2 (two) times daily.    [provider]  levocetirizine (XYZAL) 5 MG tablet Take 1 tablet (5 mg total) by mouth every evening. Patient taking differently: Take 5 mg by mouth at bedtime.  05/13/17   Charlies Silvers, MD  lidocaine (XYLOCAINE) 4 % external solution Apply topically as needed. To toes for pain 09/28/18   Landis Martins, DPM  LORazepam (ATIVAN) 0.5 MG tablet Take 1 tablet (0.5 mg total) by mouth See admin instructions. Take 0.5 mg by mouth at bedtime and 0.5 mg two times a day as needed for anxiety Patient taking differently: Take 0.5 mg by mouth as directed.  07/02/18   Debbe Odea, MD   Melatonin 5 MG TABS Take 5 mg by mouth at bedtime.    [provider]  memantine (NAMENDA) 10 MG tablet Take 10 mg by mouth 2 (two) times daily.    [provider]  Menthol, Topical Analgesic, (BIOFREEZE) 4 % GEL Apply topically as needed.    [provider]  methylcellulose (CITRUCEL) oral powder Take 0.5-1 packets by mouth 2 (two) times daily as needed (for constipation).    [provider]  mirabegron ER (MYRBETRIQ) 50 MG TB24 tablet Take 25 mg by mouth daily.     [provider]  montelukast (SINGULAIR) 10 MG tablet Take 10 mg by mouth at bedtime.     [provider]  MONUROL 3 g PACK  02/22/19   [provider]  Multiple Vitamins-Minerals (CENTRUM SILVER 50+WOMEN) TABS Take 1 tablet by mouth daily.    [provider]  NEOMYCIN-POLYMYXIN-HYDROCORTISONE (CORTISPORIN) 1 % SOLN OTIC solution Apply 1-2 drops to both toes once daily until all used 09/28/18   Landis Martins,  DPM  nitroGLYCERIN (NITROSTAT) 0.4 MG SL tablet Place 1 tablet (0.4 mg total) under the tongue every 5 (five) minutes as needed for chest pain. Reported on 11/12/2015 02/03/17   Lyda Jester M, PA-C  Peppermint Oil (IBGARD) 90 MG CPCR Take 2 capsules by mouth as directed. Per box instructions Patient taking differently: Take 1 capsule by mouth 2 (two) times daily before a meal.  02/23/18   Pyrtle, Lajuan Lines, MD  polyethylene glycol (MIRALAX / GLYCOLAX) packet Take 17 g by mouth 2 (two) times daily. Patient taking differently: Take 8.5-17 g by mouth See admin instructions. Mix and drink 17 grams two times a day and an additional 8.5-17 grams once daily, as needed for constipation 02/23/18   Pyrtle, Lajuan Lines, MD  prednisoLONE acetate (PRED FORTE) 1 % ophthalmic suspension Place 1 drop into both eyes 2 (two) times daily as needed.    [provider]  Propylene Glycol (SYSTANE BALANCE) 0.6 % SOLN Place 1 drop into both eyes 4 (four) times daily as needed.     [provider]  sertraline (ZOLOFT) 50 MG tablet Take 50 mg by mouth every evening.    [provider]  traMADol (ULTRAM) 50 MG tablet Take 50 mg by mouth every 8 (eight) hours as needed for moderate pain.     [provider]    Physical Exam: Vitals:   05/18/19 1400 05/18/19 1415 05/18/19 1445 05/18/19 1500  BP: (!) 183/75 (!) 187/72 (!) 181/80 (!) 184/72  Pulse:  82 86 86  Resp: _0 Temp:      TempSrc:      SpO2:  96% 98% 97%     . General:  Appears calm and comfortable and is NAD, somewhat tangential speech with apparent intermittent confusion . Eyes:  PERRL, EOMI, normal lids, iris . ENT:  grossly normal hearing, lips & tongue, mmm . Neck:  no LAD, masses or thyromegaly . Cardiovascular:  RRR, no m/r/g. 1+ LE edema.  Marland Kitchen Respiratory:   CTA bilaterally with no wheezes/rales/rhonchi.  Normal respiratory effort. . Abdomen:  soft, NT, ND, NABS . Skin:  no rash or induration seen on limited exam . Musculoskeletal:  grossly normal vs. Mildly atrophic tone BUE/BLE, good ROM, no bony abnormality . Psychiatric:  grossly normal mood and affect, speech fluent and appropriate, intermittent confusion . Neurologic:  CN 2-12 grossly intact, moves all extremities in coordinated fashion, sensation intact    Radiological Exams on Admission: Mr Lumbar Spine W/o Contrast  Result Date: 05/17/2019 CLINICAL DATA:  Fall, evaluate for L3 compression fracture EXAM: MRI LUMBAR SPINE WITHOUT CONTRAST TECHNIQUE: Multiplanar, multisequence MR imaging of the lumbar spine was performed. No intravenous contrast was administered. COMPARISON:  06/30/2018 FINDINGS: Motion artifact is present. Segmentation:  Standard. Alignment: Mild sigmoid curvature. There is grade 1 anterolisthesis at L5-S1. Vertebrae: Vertebral body heights are maintained apart from mild degenerative endplate irregularity. No significant marrow edema or suspicious osseous lesion. There is bilateral sacral  marrow edema. Conus medullaris and cauda equina: Conus extends to the L1 level. Conus and cauda equina appear normal. Paraspinal and other soft tissues: Interval atrophy of the left psoas muscle. Disc levels: L1-L2: Disc bulge with endplate osteophytic ridging eccentric to the right. Similar minor canal stenosis. Increased mild to moderate right foraminal stenosis. Similar minor left foraminal stenosis. L2-L3: Disc bulge with endplate osteophytic ridging and facet arthropathy with ligamentum flavum infolding. Mild canal stenosis is similar. Similar mild foraminal stenosis. L3-L4: Disc bulge with endplate  osteophytic ridging and facet arthropathy with ligamentum flavum infolding. Similar mild canal stenosis. Similar mild foraminal stenosis. L4-L5: Disc bulge with endplate osteophytic ridging and facet arthropathy with ligamentum flavum infolding. Similar marked canal stenosis with narrowing of the lateral recesses. Similar mild to moderate foraminal stenosis. L5-S1: Anterolisthesis with disc uncovering and facet arthropathy with ligamentum flavum infolding similar mild canal stenosis. Similar mild foraminal stenosis. IMPRESSION: No evidence of recent compression fracture. There is partially imaged bilateral sacral marrow edema, right greater than left, which could reflect traumatic injury. Multilevel degenerative changes as detailed above with suboptimal stenosis evaluation due to motion artifact. Canal stenosis remains greatest at L4-L5. Electronically Signed   By: Macy Mis M.D.   On: 05/17/2019 17:22    EKG: Independently reviewed.  NSR with rate 86; nonspecific ST changes with no evidence of acute ischemia; NSCSLT   Labs on Admission: I have personally reviewed the available labs and imaging studies at the time of the admission.  Pertinent labs:   Na++ 132 K+ 3.3 WBC 4.9 Hgb 11.2 Platelets 442 UA: small Hgb, +nitrite, 30 protein, many bacteria Urine culture 11/25: E coli, resistant to  Ampicillin/Augmentin UA: moderate WBC without clue cells seen   Assessment/Plan Principal Problem:   AMS (altered mental status) Active Problems:   Hyperlipidemia   Hypertension   Lumbar radiculopathy, chronic   Dementia (HCC)   Recurrent urinary tract infection   AMS with underlying dementia -Patient presenting with encephalopathy as evidenced by her confusion, tangential speech -Evaluation thus far unremarkable -Possible recurrent UTI (see below) -Based on unremarkable evaluation with current ability to protect her airway, will observe for now  -50% of patients with delirium while hospitalized will be institutionalized at 6 months, and these patients have a 25% mortality at 6 months -The family would benefit from being referred to the [facility] and also provided with the [employer] website -Continue Zoloft -Head CT is pending  Recurrent UTI -11/25 urine culture with E coli -Repeat UA somewhat concerning for UTI, culture pending -Fosfomycin was administered in the ER -Her description was more of a BV/fishy odor; wet prep was performed (at patient preference vs. Empiric treatment) and without apparent clue cells -Consider treatment with flagyl - but given the multitude of patient's allergies, will hold for now  Chronic LBP with radiculopathy -Continue Neurontin, Ultram -Will add lidocaine patch -She has recently started calcitonin NS and has not noticed improvement yet -Has ?sacral marrow edema on yesterday's MRI from recent fall, uncertain clinical utility -Will order pelvic xray -Consider outpatient referral for kyphoplasty -PT evaluation  HTN -Continue Coreg, hydralazine  H/o CAD -Continue ASA, Coreg, Imdur -Holding Lasix   Note: This patient has been tested and is pending for the novel coronavirus COVID-19.  DVT prophylaxis:  Lovenox Code Status:  DNR - confirmed with patient/family Family Communication: Granddaughter Sharyn Lull, [place] RN)  was present throughout evaluation Disposition Plan:  Back to ALF once clinically improved Consults called: None  Admission status: It is my clinical opinion that referral for OBSERVATION is reasonable and necessary in this patient based on the above information provided. The aforementioned taken together are felt to place the patient at high risk for further clinical deterioration. However it is anticipated that the patient may be medically stable for discharge from the hospital within 24 to 48 hours.    Karmen Bongo MD [facility]   How to contact the [facility] Attending or Consulting provider [place] or covering provider during after hours 7P -  7A, for this patient?  1. Check the care team in [facility] and look for a) attending/consulting [facility] provider listed and b) the [facility] team listed 2. Log into [email or web address] and use [employer] universal password to access. If you do not have the password, please contact the hospital operator. 3. Locate the [facility] provider you are looking for under [facility] and page to a number that you can be directly reached. 4. If you still have difficulty reaching the provider, please page the [facility] (Director on Call) for the Hospitalists listed on amion for assistance.   05/18/2019, 5:29 PM

## 2019-05-18 NOTE — ED Notes (Signed)
ED TO INPATIENT HANDOFF REPORT  ED Nurse Name and Phone #: Gwyndolyn Saxon [phone]  S Name/Age/Gender Norton Blizzard 85 y.o. female Room/Bed: 052C/052C  Code Status   Code Status: DNR  Home/SNF/Other Nursing Home Patient oriented to: self, place, time and situation Is this baseline? Yes   Triage Complete: Triage complete  Chief Complaint ams  Triage Note Pt to triage via [facility]. Pt from [facility]- woke up last night feeling confused at unknown time.  Sat there "trying to get her mind together" until staff arrived this morning.  Last seen normal around 7pm last night.  Talking to EMS about beach yesterday but also talking about COVID/current events.  Speech clear.  No weakness or facial droop "outside her norm" per EMS.  PT states she is sleepy because she didn't sleep well last night.   Allergies Allergies  Allergen Reactions  . Butazolidin [Phenylbutazone] Swelling and Rash  . Cephalosporins Other (See Comments)    Site of swelling not recalled  . Levaquin [Levofloxacin] Shortness Of Breath  . Morphine And Related Other (See Comments)    Hallucinations   . Trimethoprim Nausea Only  . Aleve [Naproxen] Other (See Comments)    Was told by a MD to "never take this"  . Buprenorphine Other (See Comments)    "Allergic," per MAR  . Cilostazol Other (See Comments)    Stomach upset  . Codeine Nausea And Vomiting    Tolerates Tramadol  . Dexilant [Dexlansoprazole] Diarrhea  . Diclofenac Other (See Comments)    Documented on MAR  . Erythromycin Nausea And Vomiting    All "mycin" drugs  . Lactose Intolerance (Gi) Diarrhea  . Macrodantin Other (See Comments)    Double vision Tolerates MacroBID  . Morphine Other (See Comments)    Reaction not recalled  . Simvastatin Other (See Comments)    Muscle aches   . Sulfa Antibiotics Other (See Comments)    "Allergic," per MAR  . Vesicare [Solifenacin] Other (See Comments)    Reaction not recalled by the patient- gets a lot  of UTI(s)  . Clindamycin/Lincomycin Other (See Comments)    Upsets the stomach  . Doxycycline Diarrhea  . Food Other (See Comments)    No spicy foods or black pepper, raw onions - causes gerd  . Ibuprofen Hives and Rash    Was told by a MD to "never take this"  . Lincomycin Hcl Nausea Only and Other (See Comments)    Upsets the stomach  . Penicillins Rash    Has patient had a PCN reaction causing immediate rash, facial/tongue/throat swelling, SOB or lightheadedness with hypotension: Yes Has patient had a PCN reaction causing severe rash involving mucus membranes or skin necrosis: No Has patient had a PCN reaction that required hospitalization No Has patient had a PCN reaction occurring within the last 10 years: No If all of the above answers are "NO", then may proceed with Cephalosporin use.   Lisbeth Ply [Fesoterodine Fumarate Er] Other (See Comments)    Reaction not recalled  . Voltaren [Diclofenac Sodium] Rash    Level of Care/Admitting Diagnosis ED Disposition    ED Disposition Condition [facility] Area: [place] [[id number]]  Level of Care: Med-Surg [16]  I expect the patient will be discharged within 24 hours: Yes  LOW acuity---Tx typically complete <24 hrs---ACUTE conditions typically can be evaluated <24 hours---LABS likely to return to acceptable levels <24 hours---IS near functional baseline---EXPECTED to return to current living arrangement---NOT  newly hypoxic: Meets criteria for 5C-Observation unit  Covid Evaluation: Asymptomatic Screening Protocol (No Symptoms)  Diagnosis: AMS (altered mental status) [[id number]]  Admitting Physician: Karmen Bongo [[id number]]  Attending Physician: Karmen Bongo [[id number]]  PT Class (Do Not Modify): Observation [104]  PT Acc Code (Do Not Modify): Observation [10022]       B Medical/Surgery History Past Medical History:  Diagnosis Date  . Anxiety   . Arthritis   . CAD (coronary artery disease)   .  Chondrocostal junction syndrome   . Chronic fatigue   . Dementia ([place])    without Behavioral Disturbance  . GERD (gastroesophageal reflux disease)   . Hx of CABG   . Hyperlipidemia    GOAL LDL <70  . Hypertension   . IBS (irritable bowel syndrome)   . Jaundice    yellow jaundice 3rd grade  . Major depressive disorder   . Mild chronic anemia   . Mixed incontinence   . Multiple myeloma (HCC)    not having achieved remission  . NSTEMI (non-ST elevated myocardial infarction) ([place]) 01/31/2017  . Osteopenia   . Ovarian cyst, bilateral    Rt complex  . Panic disorder   . Pulmonary nodules   . Raynaud's syndrome   . Restless legs syndrome   . Spinal stenosis of lumbar region   . Thyroid nodule    Past Surgical History:  Procedure Laterality Date  . BUNIONECTOMY WITH HAMMERTOE RECONSTRUCTION Right 08/04/2012   Procedure:  HAMMERTOE RECONSTRUCTION;  Surgeon: Colin Rhein, MD;  Location: [facility];  Service: Orthopedics;  Laterality: Right;  HAMMER TOE RECONSTRUCTION PROBABLE FLEXOR DIGITORUM LONGUS TO PROXIMAL PHALANX TRANSFER LATERALIZED   . CAPSULOTOMY Right 08/04/2012   Procedure: CAPSULOTOMY;  Surgeon: Colin Rhein, MD;  Location: [facility];  Service: Orthopedics;  Laterality: Right;  RIGHT 2ND TOE METATARSOPHALANGEAL JOINT DORSAL CAPSULOTOMY   . CATARACT EXTRACTION, BILATERAL     both cataracts  . COLONOSCOPY    . CORONARY ANGIOPLASTY WITH STENT PLACEMENT  2014   2 stents  . CORONARY ARTERY BYPASS GRAFT  2007   LIMA to LAD, SVG to RCA  non-ST segment MI 08/2009 (Dr. Tamala Julian)  . CORONARY STENT INTERVENTION N/A 02/02/2017   Procedure: CORONARY STENT INTERVENTION;  Surgeon: Belva Crome, MD;  Location: [facility];  Service: Cardiovascular;  Laterality: N/A;  Prox CFX  . DILATION AND CURETTAGE OF UTERUS    . LEFT HEART CATH AND CORS/GRAFTS ANGIOGRAPHY N/A 02/02/2017   Procedure: LEFT HEART CATH AND CORS/GRAFTS ANGIOGRAPHY;  Surgeon: Belva Crome, MD;  Location: [facility];  Service: Cardiovascular;  Laterality: N/A;  . LEFT HEART CATHETERIZATION WITH CORONARY/GRAFT ANGIOGRAM N/A 07/09/2012   Procedure: LEFT HEART CATHETERIZATION WITH Beatrix Fetters;  Surgeon: Sinclair Grooms, MD;  Location: [facility];  Service: Cardiovascular;  Laterality: N/A;  . TONSILLECTOMY AND ADENOIDECTOMY    . UPPER GASTROINTESTINAL ENDOSCOPY    . VISCERAL ANGIOGRAPHY N/A 04/15/2017   Procedure: VISCERAL ANGIOGRAPHY;  Surgeon: Waynetta Sandy, MD;  Location: [facility];  Service: Cardiovascular;  Laterality: N/A;     A IV Location/Drains/Wounds Patient Lines/Drains/Airways Status   Active Line/Drains/Airways    Name:   Placement date:   Placement time:   Site:   Days:   Peripheral IV 05/18/19 Right Forearm   05/18/19    1021    Forearm   less than 1   External Urinary Catheter   05/18/19  1031    -   less than 1          Intake/Output Last 24 hours No intake or output data in the 24 hours ending 05/18/19 2230  Labs/Imaging Results for orders placed or performed during the hospital encounter of 05/18/19 (from the past 48 hour(s))  Urinalysis, Routine w reflex microscopic     Status: Abnormal   Collection Time: 05/18/19 10:28 AM  Result Value Ref Range   Color, Urine YELLOW YELLOW   APPearance HAZY (A) CLEAR   Specific Gravity, Urine 1.011 1.005 - 1.030   pH 8.0 5.0 - 8.0   Glucose, UA NEGATIVE NEGATIVE mg/dL   Hgb urine dipstick SMALL (A) NEGATIVE   Bilirubin Urine NEGATIVE NEGATIVE   Ketones, ur NEGATIVE NEGATIVE mg/dL   Protein, ur 30 (A) NEGATIVE mg/dL   Nitrite POSITIVE (A) NEGATIVE   Leukocytes,Ua NEGATIVE NEGATIVE   RBC / HPF 0-5 0 - 5 RBC/hpf   WBC, UA 0-5 0 - 5 WBC/hpf   Bacteria, UA MANY (A) NONE SEEN    Comment: Performed at [facility], [address], [place], [place] [address]  CBC with Differential     Status: Abnormal   Collection Time: 05/18/19 10:29 AM  Result Value  Ref Range   WBC 4.9 4.0 - 10.5 K/uL   RBC 3.37 (L) 3.87 - 5.11 MIL/uL   Hemoglobin 11.2 (L) 12.0 - 15.0 g/dL   HCT 33.6 (L) 36.0 - 46.0 %   MCV 99.7 80.0 - 100.0 fL   MCH 33.2 26.0 - 34.0 pg   MCHC 33.3 30.0 - 36.0 g/dL   RDW 13.1 11.5 - 15.5 %   Platelets 442 (H) 150 - 400 K/uL   nRBC 0.0 0.0 - 0.2 %   Neutrophils Relative % 71 %   Neutro Abs 3.5 1.7 - 7.7 K/uL   Lymphocytes Relative 18 %   Lymphs Abs 0.9 0.7 - 4.0 K/uL   Monocytes Relative 10 %   Monocytes Absolute 0.5 0.1 - 1.0 K/uL   Eosinophils Relative 1 %   Eosinophils Absolute 0.0 0.0 - 0.5 K/uL   Basophils Relative 0 %   Basophils Absolute 0.0 0.0 - 0.1 K/uL   Immature Granulocytes 0 %   Abs Immature Granulocytes 0.01 0.00 - 0.07 K/uL    Comment: Performed at [facility] [address] [place], [place] [address]  Basic metabolic panel     Status: Abnormal   Collection Time: 05/18/19 10:29 AM  Result Value Ref Range   Sodium 132 (L) 135 - 145 mmol/L   Potassium 3.3 (L) 3.5 - 5.1 mmol/L   Chloride 94 (L) 98 - 111 mmol/L   CO2 25 22 - 32 mmol/L   Glucose, Bld 98 70 - 99 mg/dL   BUN 14 8 - 23 mg/dL   Creatinine, Ser 0.55 0.44 - 1.00 mg/dL   Calcium 9.3 8.9 - 10.3 mg/dL   GFR calc non Af Amer >60 >60 mL/min   GFR calc Af Amer >60 >60 mL/min   Anion gap 13 5 - 15    Comment: Performed at [facility], [place] [address] [place], [place] [address]  SARS CORONAVIRUS 2 (TAT 6-24 HRS) Nasopharyngeal Nasopharyngeal Swab     Status: None   Collection Time: 05/18/19  2:10 PM   Specimen: Nasopharyngeal Swab  Result Value Ref Range   SARS Coronavirus 2 NEGATIVE NEGATIVE    Comment: (NOTE) SARS-CoV-2 target nucleic acids are NOT  DETECTED. The SARS-CoV-2 RNA is generally detectable in upper and lower respiratory specimens during the acute phase of infection. Negative results do not preclude SARS-CoV-2 infection, do not rule out co-infections with other pathogens, and should not be used as the sole basis for  treatment or other patient management decisions. Negative results must be combined with clinical observations, patient history, and epidemiological information. The expected result is Negative. Fact Sheet for Patients: SugarRoll.be Fact Sheet for Healthcare Providers: https://www.woods-mathews.com/ This test is not yet approved or cleared by the [place] FDA and  has been authorized for detection and/or diagnosis of SARS-CoV-2 by FDA under an Emergency Use Authorization (EUA). This EUA will remain  in effect (meaning this test can be used) for the duration of the COVID-19 declaration under Section 56 4(b)(1) of the Act, 21 U.S.C. section 360bbb-3(b)(1), unless the authorization is terminated or revoked sooner. Performed at [facility], [place] [address], [place], [place] [address]   Wet prep, genital     Status: Abnormal   Collection Time: 05/18/19  3:27 PM  Result Value Ref Range   Yeast Wet Prep HPF POC NONE SEEN NONE SEEN   Trich, Wet Prep NONE SEEN NONE SEEN   Clue Cells Wet Prep HPF POC NONE SEEN NONE SEEN   WBC, Wet Prep HPF POC MODERATE (A) NONE SEEN   Sperm NONE SEEN     Comment: Performed at [facility], [place] [address] [place], [place] [address]   Ct Head Wo Contrast  Result Date: 05/18/2019 CLINICAL DATA:  Altered level of consciousness EXAM: CT HEAD WITHOUT CONTRAST TECHNIQUE: Contiguous axial images were obtained from the base of the skull through the vertex without intravenous contrast. COMPARISON:  CT Oct 28, 2015 FINDINGS: Brain: Stable region of gliosis/encephalomalacia in the right frontal lobe No evidence of acute infarction, hemorrhage, hydrocephalus, extra-axial collection or mass lesion/mass effect. Symmetric prominence of the ventricles, cisterns and sulci compatible with parenchymal volume loss. Patchy areas of white matter hypoattenuation are most compatible with chronic microvascular angiopathy. Vascular:  Atherosclerotic calcification of the carotid siphons and intradural vertebral arteries. No hyperdense vessel. Skull: No calvarial fracture or suspicious osseous lesion. No scalp swelling or hematoma. Sinuses/Orbits: Paranasal sinuses and mastoid air cells are predominantly clear. Orbital structures are unremarkable aside from prior lens extractions. Other: None IMPRESSION: 1. No acute intracranial abnormality. 2. Stable region of gliosis/encephalomalacia in the right frontal lobe. 3. Stable parenchymal volume loss and chronic microvascular angiopathy. Electronically Signed   By: Lovena Le M.D.   On: 05/18/2019 20:14   Mr Lumbar Spine W/o Contrast  Result Date: 05/17/2019 CLINICAL DATA:  Fall, evaluate for L3 compression fracture EXAM: MRI LUMBAR SPINE WITHOUT CONTRAST TECHNIQUE: Multiplanar, multisequence MR imaging of the lumbar spine was performed. No intravenous contrast was administered. COMPARISON:  06/30/2018 FINDINGS: Motion artifact is present. Segmentation:  Standard. Alignment: Mild sigmoid curvature. There is grade 1 anterolisthesis at L5-S1. Vertebrae: Vertebral body heights are maintained apart from mild degenerative endplate irregularity. No significant marrow edema or suspicious osseous lesion. There is bilateral sacral marrow edema. Conus medullaris and cauda equina: Conus extends to the L1 level. Conus and cauda equina appear normal. Paraspinal and other soft tissues: Interval atrophy of the left psoas muscle. Disc levels: L1-L2: Disc bulge with endplate osteophytic ridging eccentric to the right. Similar minor canal stenosis. Increased mild to moderate right foraminal stenosis. Similar minor left foraminal stenosis. L2-L3: Disc bulge with endplate osteophytic ridging and facet arthropathy with ligamentum flavum infolding.  Mild canal stenosis is similar. Similar mild foraminal stenosis. L3-L4: Disc bulge with endplate osteophytic ridging and facet arthropathy with ligamentum flavum infolding.  Similar mild canal stenosis. Similar mild foraminal stenosis. L4-L5: Disc bulge with endplate osteophytic ridging and facet arthropathy with ligamentum flavum infolding. Similar marked canal stenosis with narrowing of the lateral recesses. Similar mild to moderate foraminal stenosis. L5-S1: Anterolisthesis with disc uncovering and facet arthropathy with ligamentum flavum infolding similar mild canal stenosis. Similar mild foraminal stenosis. IMPRESSION: No evidence of recent compression fracture. There is partially imaged bilateral sacral marrow edema, right greater than left, which could reflect traumatic injury. Multilevel degenerative changes as detailed above with suboptimal stenosis evaluation due to motion artifact. Canal stenosis remains greatest at L4-L5. Electronically Signed   By: Macy Mis M.D.   On: 05/17/2019 17:22   Dg Hips Bilat With Pelvis 3-4 Views  Result Date: 05/18/2019 CLINICAL DATA:  BILATERAL hip pain after falling EXAM: DG HIP (WITH OR WITHOUT PELVIS) 3-4V BILAT COMPARISON:  05/04/2019 Correlation: MR LEFT hip 06/30/2018, LEFT hip radiographs 06/30/2018 FINDINGS: Osseous demineralization. Hip and SI joint spaces preserved. Soft tissue calcification medial to the proximal LEFT femur new since 06/30/2018 radiographs likely represents ossification in the torn distal LEFT iliopsoas tendon which was noted on prior MR. No acute fracture, dislocation, or bone destruction. IMPRESSION: Osseous demineralization without acute abnormalities. Ossification at previously torn distal LEFT iliopsoas tendon. Electronically Signed   By: Lavonia Dana M.D.   On: 05/18/2019 18:43    Pending Labs Unresulted Labs (From admission, onward)    Start     Ordered   05/19/19 4098  Basic metabolic panel  Tomorrow morning,   R     05/18/19 1619   05/19/19 0500  CBC  Tomorrow morning,   R     05/18/19 1619          Vitals/Pain Today's Vitals   05/18/19 1445 05/18/19 1500 05/18/19 1853 05/18/19 1938   BP: (!) 181/80 (!) 184/72 (!) 147/60   Pulse: 86 86 82   Resp: '16 15 12   ' Temp:   98.2 F (36.8 C)   TempSrc:   Oral   SpO2: 98% 97% 96%   PainSc:    0-No pain    Isolation Precautions No active isolations  Medications Medications  aspirin EC tablet 81 mg (has no administration in time range)  traMADol (ULTRAM) tablet 50 mg (has no administration in time range)  carvedilol (COREG) tablet 3.125 mg (3.125 mg Oral Given 05/18/19 2134)  cholestyramine (QUESTRAN) packet 4 g (has no administration in time range)  isosorbide mononitrate (IMDUR) 24 hr tablet 60 mg (has no administration in time range)  sertraline (ZOLOFT) tablet 25 mg (has no administration in time range)  sertraline (ZOLOFT) tablet 50 mg (50 mg Oral Given 05/18/19 2134)  famotidine (PEPCID) tablet 20 mg (20 mg Oral Given 05/18/19 2134)  polyethylene glycol (MIRALAX / GLYCOLAX) packet 17 g (has no administration in time range)  baclofen (LIORESAL) tablet 5 mg (has no administration in time range)  gabapentin (NEURONTIN) capsule 100 mg (100 mg Oral Given 05/18/19 2133)  Soothe XP SOLN 1 drop (has no administration in time range)  cycloSPORINE (RESTASIS) 0.05 % ophthalmic emulsion 1 drop (has no administration in time range)  prednisoLONE acetate (PRED FORTE) 1 % ophthalmic suspension 1 drop (has no administration in time range)  Propylene Glycol 0.6 % SOLN 1 drop (has no administration in time range)  enoxaparin (LOVENOX) injection 40 mg (40 mg Subcutaneous  Given 05/18/19 2156)  acetaminophen (TYLENOL) tablet 650 mg (has no administration in time range)    Or  acetaminophen (TYLENOL) suppository 650 mg (has no administration in time range)  ondansetron (ZOFRAN) tablet 4 mg (has no administration in time range)    Or  ondansetron (ZOFRAN) injection 4 mg (has no administration in time range)  lidocaine (LIDODERM) 5 % 1 patch (1 patch Transdermal Patch Applied 05/18/19 2157)  hydrALAZINE (APRESOLINE) tablet 10 mg (has no  administration in time range)  hydrALAZINE (APRESOLINE) tablet 10 mg (has no administration in time range)  fosfomycin (MONUROL) packet 3 g (3 g Oral Given 05/18/19 1428)    Mobility walks with device High fall risk   Focused Assessments Neuro Assessment Handoff:  Swallow screen pass?          Neuro Assessment:   Neuro Checks:      Last Documented NIHSS Modified Score:   Has TPA been given? No If patient is a Neuro Trauma and patient is going to OR before floor call report to [facility] nurse: [phone] or [phone]     R Recommendations: See Admitting Provider Note  Report given to:   Additional Notes:  Pt is A&Ox 4 with intermit confusion and repeats questions; Pt has new onset of weakness; Hx of dementia and recent fall resulting in possible sacral marrow edema; Lido patch placed on lower back for pain; Patient has been voiding using [place],RN

## 2019-05-18 NOTE — ED Provider Notes (Signed)
[facility] EMERGENCY DEPARTMENT Provider Note   CSN: [id number] Arrival date & time: 05/18/19  4098     History   Chief Complaint Chief Complaint  Patient presents with  . Weakness    HPI [name] is a 85 y.o. female with medical history significant for mild cognitive impairment, Indolent multiple myeloma ( last seen by Dr.Sherrill in 08/2018) , CAD, and recurrent UTI's on Methenamine presenting for evaluation of episodes of confusion. On exam patient is back to her baseline but reports feeling very confused last night.  Reports she thought she was somewhere else and was trying to call out for her daughter.    HPI  Past Medical History:  Diagnosis Date  . Anxiety   . Arthritis   . CAD (coronary artery disease)   . Chondrocostal junction syndrome   . Chronic fatigue   . Dementia ([place])    without Behavioral Disturbance  . GERD (gastroesophageal reflux disease)   . Hx of CABG   . Hyperlipidemia    GOAL LDL <70  . Hypertension   . IBS (irritable bowel syndrome)   . Jaundice    yellow jaundice 3rd grade  . Major depressive disorder   . Mild chronic anemia   . Mixed incontinence   . Multiple myeloma (HCC)    not having achieved remission  . NSTEMI (non-ST elevated myocardial infarction) ([place]) 01/31/2017  . Osteopenia   . Ovarian cyst, bilateral    Rt complex  . Panic disorder   . Pulmonary nodules   . Radiculopathy   . Raynaud's syndrome   . Restless legs syndrome   . Spinal stenosis of lumbar region   . Thyroid nodule     Patient Active Problem List   Diagnosis Date Noted  . Left hip pain 06/30/2018  . Disc degeneration, lumbar 06/23/2018  . Left thyroid nodule 08/29/2017  . Chronic idiopathic constipation 08/18/2017  . Abdominal pain 07/31/2017  . Mesenteric ischemia ([place]) 05/04/2017  . Unsteadiness on feet   . Unstable angina pectoris ([place])   . Tinea pedis   . Spinal stenosis of lumbar region   . Somnolence   . Seasonal  allergic rhinitis   . Restless legs syndrome   . Raynaud's syndrome   . Raynauds syndrome   . Radiculopathy   . Panic disorder   . Ovarian cyst, bilateral   . Osteopenia   . Myocardial infarction ([place])   . Muscle weakness (generalized)   . Mixed incontinence   . Mild chronic anemia   . Major depressive disorder   . Long term (current) use of aspirin   . Intermediate coronary syndrome ([place])   . Incontinent of urine   . IBS (irritable bowel syndrome)   . Hypercholesteremia   . Hx of CABG   . GERD (gastroesophageal reflux disease)   . Dementia ([place])   . Chest pain   . Atherosclerotic heart disease of native coronary artery without angina pectoris   . Arthritis   . Anxiety   . Allergic rhinitis   . Abnormal weight loss   . History of adenomatous polyp of colon 03/02/2017  . NSTEMI (non-ST elevated myocardial infarction) ([place]) 02/02/2017  . SOB (shortness of breath) 09/02/2016  . Dizziness 09/02/2016  . Non-ST elevation (NSTEMI) myocardial infarction ([place]) 11/26/2015  . Polymyalgia rheumatica ([place]) 11/26/2015  . Hypokalemia 11/26/2015  . Mild persistent asthma 11/12/2015  . Coronary artery disease involving autologous artery coronary bypass graft with angina pectoris with documented spasm (  [place]) 11/12/2015  . Multiple myeloma in remission ([place]) 11/12/2015  . Current use of beta blocker 11/12/2015  . Gastroesophageal reflux disease without esophagitis 11/12/2015  . Other allergic rhinitis 11/12/2015  . Weight loss, non-intentional 10/03/2015  . Asthma 09/24/2015  . Asthma in adult without complication 93/90/3009  . Seasonal allergic rhinitis due to pollen 09/24/2015  . Cervical radiculopathy 07/25/2015  . Chronic coronary artery disease 07/25/2015  . Coronary artery disease 07/25/2015  . Disturbance in sleep behavior 07/25/2015  . Elevated CK 07/25/2015  . Elevated creatine kinase level 07/25/2015  . Generalized osteoarthritis of hand 07/25/2015  . Other long term  (current) drug therapy 07/25/2015  . Overactive bladder 07/25/2015  . Restless leg 07/25/2015  . Restless leg syndrome 07/25/2015  . Sleep disturbances 07/25/2015  . Idiopathic peripheral neuropathy 07/04/2015  . Lumbar radiculopathy, chronic 07/04/2015  . Mixed dementia ([place]) 07/04/2015  . Risk for falls 07/04/2015  . Sleep disturbance 07/04/2015  . Chest pain with moderate risk for cardiac etiology 06/13/2015  . Right sciatic nerve pain 10/19/2014  . Abnormal thyroid blood test 08/25/2014  . Chronic fatigue 08/09/2014  . Multiple myeloma ([place]) 07/21/2013  . Depression 01/14/2013  . Generalized anxiety disorder 01/14/2013  . Gastroesophageal reflux disease 01/13/2013  . Urine test positive for microalbuminuria 12/08/2011  . Cyst of ovary 12/17/2010  . KNEE PAIN, BILATERAL 08/15/2010  . Abnormal gait 08/15/2010  . Atherosclerosis of coronary artery bypass graft 10/11/2009  . CHEST PAIN, NON-CARDIAC 10/11/2009  . HIP PAIN, RIGHT 08/13/2009  . GREATER TROCHANTERIC BURSITIS 08/13/2009  . LUMBOSACRAL SPONDYLOSIS WITHOUT MYELOPATHY 11/10/2008  . Osteoarthritis of lumbosacral spine without myelopathy 11/10/2008  . FOOT PAIN, BILATERAL 06/30/2008  . Hyperlipidemia 06/22/2008  . Hypertension 06/22/2008  . Raynaud's disease 06/22/2008  . BURSITIS, LEFT SHOULDER 06/22/2008    Past Surgical History:  Procedure Laterality Date  . BUNIONECTOMY WITH HAMMERTOE RECONSTRUCTION Right 08/04/2012   Procedure:  HAMMERTOE RECONSTRUCTION;  Surgeon: Colin Rhein, MD;  Location: [facility];  Service: Orthopedics;  Laterality: Right;  HAMMER TOE RECONSTRUCTION PROBABLE FLEXOR DIGITORUM LONGUS TO PROXIMAL PHALANX TRANSFER LATERALIZED   . CAPSULOTOMY Right 08/04/2012   Procedure: CAPSULOTOMY;  Surgeon: Colin Rhein, MD;  Location: [facility];  Service: Orthopedics;  Laterality: Right;  RIGHT 2ND TOE METATARSOPHALANGEAL JOINT DORSAL CAPSULOTOMY   . CATARACT EXTRACTION,  BILATERAL     both cataracts  . COLONOSCOPY    . CORONARY ANGIOPLASTY WITH STENT PLACEMENT  2014   2 stents  . CORONARY ARTERY BYPASS GRAFT  2007   LIMA to LAD, SVG to RCA  non-ST segment MI 08/2009 (Dr. Tamala Julian)  . CORONARY STENT INTERVENTION N/A 02/02/2017   Procedure: CORONARY STENT INTERVENTION;  Surgeon: Belva Crome, MD;  Location: [facility];  Service: Cardiovascular;  Laterality: N/A;  Prox CFX  . DILATION AND CURETTAGE OF UTERUS    . LEFT HEART CATH AND CORS/GRAFTS ANGIOGRAPHY N/A 02/02/2017   Procedure: LEFT HEART CATH AND CORS/GRAFTS ANGIOGRAPHY;  Surgeon: Belva Crome, MD;  Location: [facility];  Service: Cardiovascular;  Laterality: N/A;  . LEFT HEART CATHETERIZATION WITH CORONARY/GRAFT ANGIOGRAM N/A 07/09/2012   Procedure: LEFT HEART CATHETERIZATION WITH Beatrix Fetters;  Surgeon: Sinclair Grooms, MD;  Location[facility];  Service: Cardiovascular;  Laterality: N/A;  . TONSILLECTOMY AND ADENOIDECTOMY    . UPPER GASTROINTESTINAL ENDOSCOPY    . VISCERAL ANGIOGRAPHY N/A 04/15/2017   Procedure: VISCERAL ANGIOGRAPHY;  Surgeon: Waynetta Sandy,  MD;  Location: [facility];  Service: Cardiovascular;  Laterality: N/A;     OB History   No obstetric history on file.      Home Medications    Prior to Admission medications   Medication Sig Start Date End Date Taking? Authorizing Provider  acetaminophen (TYLENOL) 650 MG CR tablet Take 1,300 mg by mouth 2 (two) times daily.    [provider]  Alum Hydroxide-Mag Carbonate (GAVISCON EXTRA STRENGTH) [phone] MG/10ML SUSP Take 15 mLs by mouth 4 (four) times daily as needed (for reflux).     [provider]  Artificial Tear Solution (SOOTHE XP) SOLN Place 1 drop into both eyes 2 (two) times daily.    [provider]  aspirin EC 81 MG tablet Take 1 tablet (81 mg total) by mouth daily. 01/21/16   Minus Breeding, MD  baclofen 5 MG TABS Take 5 mg by mouth 3 (three) times  daily as needed for muscle spasms. 07/02/18   Debbe Odea, MD  Biotin 5000 MCG SUBL Place 10,000 mcg under the tongue daily.    [provider]  Calcium Carb-Cholecalciferol (CALCIUM 600+D3) 600-200 MG-UNIT TABS Take 1 tablet by mouth 2 (two) times daily.    [provider]  carvedilol (COREG) 3.125 MG tablet Take 1 tablet (3.125 mg total) by mouth 2 (two) times daily with a meal. 01/05/18   Minus Breeding, MD  cholestyramine (QUESTRAN) 4 g packet Take 4 g by mouth as directed. 1/2 pack every 2 days    [provider]  conjugated estrogens (PREMARIN) vaginal cream Place 1 Applicatorful vaginally 2 (two) times a week.    [provider]  Cranberry (ELLURA) 200 MG CAPS Take 200 mg by mouth daily.    [provider]  cycloSPORINE (RESTASIS) 0.05 % ophthalmic emulsion Place 1 drop into both eyes 2 (two) times daily.     [provider]  D-Mannose POWD Take 1 Scoop by mouth daily.    [provider]  docusate sodium (COLACE) 100 MG capsule Take 100 mg by mouth See admin instructions. Take 100 mg by mouth once a day and an additional 100 mg as needed for constipation    [provider]  famotidine (PEPCID) 20 MG tablet Take 1 tablet (20 mg total) by mouth 2 (two) times daily. 09/24/18   Pyrtle, Lajuan Lines, MD  furosemide (LASIX) 20 MG tablet Take 1 tablet (20 mg total) by mouth daily. 04/29/19 07/28/19  Lendon Colonel, NP  gabapentin (NEURONTIN) 100 MG capsule Take 100 mg by mouth 2 (two) times daily.     [provider]  hydrALAZINE (APRESOLINE) 10 MG tablet Take 1 tablet (10 mg total) by mouth daily at 3 pm. Patient taking differently: Take 10 mg by mouth See admin instructions. Take 10 mg by mouth daily at 2:30 PM and an additional 10 mg two times a day as needed if Systolic reading is >202 54/27/06   Almyra Deforest, PA  hydrALAZINE (APRESOLINE) 25 MG tablet Take 1 tablet (25 mg total) by mouth 2 (two) times daily. 8 am and 10 pm  Patient taking differently: Take 25 mg by mouth 2 (two) times daily.  05/26/18   Almyra Deforest, PA  iron polysaccharides (NU-IRON) 150 MG capsule Take 150 mg by mouth 2 (two) times daily.    [provider]  isosorbide mononitrate (IMDUR) 60 MG 24 hr tablet Take 60 mg by mouth daily.    [provider]  lactobacillus acidophilus (BACID)  TABS tablet Take 1 tablet by mouth 2 (two) times daily.    [provider]  levocetirizine (XYZAL) 5 MG tablet Take 1 tablet (5 mg total) by mouth every evening. Patient taking differently: Take 5 mg by mouth at bedtime.  05/13/17   Charlies Silvers, MD  lidocaine (XYLOCAINE) 4 % external solution Apply topically as needed. To toes for pain 09/28/18   Landis Martins, DPM  LORazepam (ATIVAN) 0.5 MG tablet Take 1 tablet (0.5 mg total) by mouth See admin instructions. Take 0.5 mg by mouth at bedtime and 0.5 mg two times a day as needed for anxiety Patient taking differently: Take 0.5 mg by mouth as directed.  07/02/18   Debbe Odea, MD  Melatonin 5 MG TABS Take 5 mg by mouth at bedtime.    [provider]  memantine (NAMENDA) 10 MG tablet Take 10 mg by mouth 2 (two) times daily.    [provider]  Menthol, Topical Analgesic, (BIOFREEZE) 4 % GEL Apply topically as needed.    [provider]  methylcellulose (CITRUCEL) oral powder Take 0.5-1 packets by mouth 2 (two) times daily as needed (for constipation).    [provider]  mirabegron ER (MYRBETRIQ) 50 MG TB24 tablet Take 25 mg by mouth daily.     [provider]  montelukast (SINGULAIR) 10 MG tablet Take 10 mg by mouth at bedtime.     [provider]  MONUROL 3 g PACK  02/22/19   [provider]  Multiple Vitamins-Minerals (CENTRUM SILVER 50+WOMEN) TABS Take 1 tablet by mouth daily.    [provider]  NEOMYCIN-POLYMYXIN-HYDROCORTISONE (CORTISPORIN) 1 % SOLN OTIC solution Apply 1-2 drops to both toes once daily until all  used 09/28/18   Landis Martins, DPM  nitroGLYCERIN (NITROSTAT) 0.4 MG SL tablet Place 1 tablet (0.4 mg total) under the tongue every 5 (five) minutes as needed for chest pain. Reported on 11/12/2015 02/03/17   Lyda Jester M, PA-C  Peppermint Oil (IBGARD) 90 MG CPCR Take 2 capsules by mouth as directed. Per box instructions Patient taking differently: Take 1 capsule by mouth 2 (two) times daily before a meal.  02/23/18   Pyrtle, Lajuan Lines, MD  polyethylene glycol (MIRALAX / GLYCOLAX) packet Take 17 g by mouth 2 (two) times daily. Patient taking differently: Take 8.5-17 g by mouth See admin instructions. Mix and drink 17 grams two times a day and an additional 8.5-17 grams once daily, as needed for constipation 02/23/18   Pyrtle, Lajuan Lines, MD  prednisoLONE acetate (PRED FORTE) 1 % ophthalmic suspension Place 1 drop into both eyes 2 (two) times daily as needed.    [provider]  Propylene Glycol (SYSTANE BALANCE) 0.6 % SOLN Place 1 drop into both eyes 4 (four) times daily as needed.    [provider]  sertraline (ZOLOFT) 50 MG tablet Take 50 mg by mouth every evening.    [provider]  traMADol (ULTRAM) 50 MG tablet Take 50 mg by mouth every 8 (eight) hours as needed for moderate pain.     [provider]    Family History Family History  Problem Relation Age of Onset  . Heart attack Mother   . CAD Mother   . Obesity Sister   . Breast cancer Paternal Aunt   . Ulcers Father   . Asthma Neg Hx   . Eczema Neg Hx   . Urticaria Neg Hx   . Allergic rhinitis Neg Hx   . Angioedema  Neg Hx   . Atopy Neg Hx   . Immunodeficiency Neg Hx     Social History Social History   Tobacco Use  . Smoking status: Never Smoker  . Smokeless tobacco: Never Used  Substance Use Topics  . Alcohol use: Never    Frequency: Never  . Drug use: No     Allergies   Butazolidin [phenylbutazone], Cephalosporins, Levaquin [levofloxacin], Morphine and related, Trimethoprim, Aleve  [naproxen], Buprenorphine, Cilostazol, Codeine, Dexilant [dexlansoprazole], Diclofenac, Erythromycin, Lactose intolerance (gi), Macrodantin, Morphine, Simvastatin, Sulfa antibiotics, Vesicare [solifenacin], Clindamycin/lincomycin, Doxycycline, Food, Ibuprofen, Lincomycin hcl, Penicillins, Toviaz [fesoterodine fumarate er], and Voltaren [diclofenac sodium]   Review of Systems Review of Systems  All other systems reviewed and are negative.    Physical Exam Updated Vital Signs BP (!) 193/84   Pulse 85   Temp 98.6 F (37 C) (Oral)   Resp 11   SpO2 96%   Physical Exam Constitutional:      General: She is not in acute distress.    Comments: Frail  Cardiovascular:     Rate and Rhythm: Normal rate and regular rhythm.     Heart sounds: No murmur. No friction rub. No gallop.   Pulmonary:     Breath sounds: No wheezing, rhonchi or rales.  Abdominal:     General: Bowel sounds are normal.     Tenderness: There is no abdominal tenderness.  Skin:    General: Skin is warm and dry.  Neurological:     General: No focal deficit present.     Mental Status: She is alert and oriented to person, place, and time. Mental status is at baseline.  Psychiatric:        Mood and Affect: Mood normal.        Behavior: Behavior normal.        Thought Content: Thought content normal.      ED Treatments / Results  Labs (all labs ordered are listed, but only abnormal results are displayed) Labs Reviewed  CBC WITH DIFFERENTIAL/PLATELET - Abnormal; Notable for the following components:      Result Value   RBC 3.37 (*)    Hemoglobin 11.2 (*)    HCT 33.6 (*)    Platelets 442 (*)    All other components within normal limits  URINALYSIS, ROUTINE W REFLEX MICROSCOPIC  BASIC METABOLIC PANEL    EKG None  Radiology Mr Lumbar Spine W/o Contrast  Result Date: 05/17/2019 CLINICAL DATA:  Fall, evaluate for L3 compression fracture EXAM: MRI LUMBAR SPINE WITHOUT CONTRAST TECHNIQUE: Multiplanar,  multisequence MR imaging of the lumbar spine was performed. No intravenous contrast was administered. COMPARISON:  06/30/2018 FINDINGS: Motion artifact is present. Segmentation:  Standard. Alignment: Mild sigmoid curvature. There is grade 1 anterolisthesis at L5-S1. Vertebrae: Vertebral body heights are maintained apart from mild degenerative endplate irregularity. No significant marrow edema or suspicious osseous lesion. There is bilateral sacral marrow edema. Conus medullaris and cauda equina: Conus extends to the L1 level. Conus and cauda equina appear normal. Paraspinal and other soft tissues: Interval atrophy of the left psoas muscle. Disc levels: L1-L2: Disc bulge with endplate osteophytic ridging eccentric to the right. Similar minor canal stenosis. Increased mild to moderate right foraminal stenosis. Similar minor left foraminal stenosis. L2-L3: Disc bulge with endplate osteophytic ridging and facet arthropathy with ligamentum flavum infolding. Mild canal stenosis is similar. Similar mild foraminal stenosis. L3-L4: Disc bulge with endplate osteophytic ridging and facet arthropathy with ligamentum flavum infolding. Similar mild canal stenosis. Similar mild foraminal  stenosis. L4-L5: Disc bulge with endplate osteophytic ridging and facet arthropathy with ligamentum flavum infolding. Similar marked canal stenosis with narrowing of the lateral recesses. Similar mild to moderate foraminal stenosis. L5-S1: Anterolisthesis with disc uncovering and facet arthropathy with ligamentum flavum infolding similar mild canal stenosis. Similar mild foraminal stenosis. IMPRESSION: No evidence of recent compression fracture. There is partially imaged bilateral sacral marrow edema, right greater than left, which could reflect traumatic injury. Multilevel degenerative changes as detailed above with suboptimal stenosis evaluation due to motion artifact. Canal stenosis remains greatest at L4-L5. Electronically Signed   By: Macy Mis M.D.   On: 05/17/2019 17:22    Procedures Procedures (including critical care time)  Medications Ordered in ED Medications - No data to display   Initial Impression / Assessment and Plan / ED Course  I have reviewed the triage vital signs and the nursing notes.  Pertinent labs & imaging results that were available during my care of the patient were reviewed by me and considered in my medical decision making (see chart for details).  [name] episode of confusion last night.  Has history of complicated UTI.  Denies any dysuria. Endorses frequency and confusion. Currently on Methenamine. Urinalysis positive for nitrites. The previous urine cultures on 11/25 showed E. coli . Patient also had a fall 2 weeks ago and on centrally acting medications. Patient will be given Fosfomycin for UTI and need admission for further workup.  Pending test: CT head  Discussed with hospitalist, Dr. Lorin Mercy who agreed to admit patient.      Final Clinical Impressions(s) / ED Diagnoses   Final diagnoses:  None    ED Discharge Orders    None       Madalyn Rob, MD 05/18/19 [place], Ankit, MD 05/20/19 1931

## 2019-05-19 LAB — BASIC METABOLIC PANEL
Anion gap: 9 (ref 5–15)
BUN: 12 mg/dL (ref 8–23)
CO2: 26 mmol/L (ref 22–32)
Calcium: 8.6 mg/dL — ABNORMAL LOW (ref 8.9–10.3)
Chloride: 100 mmol/L (ref 98–111)
Creatinine, Ser: 0.55 mg/dL (ref 0.44–1.00)
GFR calc Af Amer: >60 mL/min (ref >60)
GFR calc non Af Amer: >60 mL/min (ref >60)
Glucose, Bld: 93 mg/dL (ref 70–99)
Potassium: 3.1 mmol/L — ABNORMAL LOW (ref 3.5–5.1)
Sodium: 135 mmol/L (ref 135–145)

## 2019-05-19 LAB — CBC
HCT: 27.3 % — ABNORMAL LOW (ref 36.0–46.0)
Hemoglobin: 9.4 g/dL — ABNORMAL LOW (ref 12.0–15.0)
MCH: 33.5 pg (ref 26.0–34.0)
MCHC: 34.4 g/dL (ref 30.0–36.0)
MCV: 97.2 fL (ref 80.0–100.0)
Platelets: 445 10*3/uL — ABNORMAL HIGH (ref 150–400)
RBC: 2.81 MIL/uL — ABNORMAL LOW (ref 3.87–5.11)
RDW: 13 % (ref 11.5–15.5)
WBC: 4.2 10*3/uL (ref 4.0–10.5)
nRBC: 0 % (ref 0.0–0.2)

## 2019-05-19 LAB — GLUCOSE, CAPILLARY: Glucose-Capillary: 113 mg/dL — ABNORMAL HIGH (ref 70–99)

## 2019-05-19 MED ORDER — ALUM & MAG HYDROXIDE-SIMETH 200-200-20 MG/5ML PO SUSP
30.0000 mL | ORAL | Status: DC | PRN
  Administered 2019-05-19: 30 mL via ORAL
  Filled 2019-05-19: qty 30

## 2019-05-19 MED ORDER — LIDOCAINE 5 % EX PTCH: 1.0000 | MEDICATED_PATCH | CUTANEOUS | 0 refills | Status: AC

## 2019-05-19 NOTE — Evaluation (Signed)
Physical Therapy Evaluation Patient Details Name: [name] MRN: [id number] DOB: 1932-01-24 Today's Date: 05/19/2019   History of Present Illness  85yo female c/o UTI symptoms, weakness, and presenting with increased confusion. Admitted for AMS with underlying dementia, recurrent UTI. PMH CAD, chronic fatigue, dementia, CABG, HLD, HTN, multiple myeloma, NSTEMI, panic disorder, bunionectomy  Clinical Impression   Patient received in bed, pleasantly confused and willing to participate in therapy; daughter also present and assisted in providing PLOF and baseline information. Noted strong lean to R in bed, patient seems unaware but able to correct with VC and extended time. Required MaxA for bed mobility, ModA for sit to stand with RW, and tolerated gait training approximately 24f with RW and min guard with extended time. Very easily distracted but easily redirected, very much limited by fatigue today. Per information about PLOF, it seems that functionally she is quite a ways from where she would need to be to go straight back to ALF setting. She was left in bed with all needs met, bed alarm active and daughter present. Currently recommending SNF and 24/7A moving forward.     Follow Up Recommendations SNF;Supervision/Assistance - 24 hour    Equipment Recommendations  Rolling walker with 5" wheels;3in1 (PT);Wheelchair (measurements PT);Wheelchair cushion (measurements PT)    Recommendations for Other Services       Precautions / Restrictions Precautions Precautions: Fall Restrictions Weight Bearing Restrictions: No      Mobility  Bed Mobility Overal bed mobility: Needs Assistance Bed Mobility: Supine to Sit;Rolling Rolling: Min guard   Supine to sit: Max assist     General bed mobility comments: able to roll to her side well, then got stuck and required MaxA to come up to full upright position, able to maintain midline with min guard  Transfers Overall transfer level: Needs  assistance Equipment used: Rolling walker (2 wheeled) Transfers: Sit to/from Stand Sit to Stand: Mod assist         General transfer comment: ModA to boost to full standing position and gain balance initially, cues for safety and sequencing  Ambulation/Gait Ambulation/Gait assistance: Min guard Gait Distance (Feet): 10 Feet Assistive device: Rolling walker (2 wheeled) Gait Pattern/deviations: Step-to pattern;Decreased step length - right;Decreased step length - left;Shuffle;Decreased dorsiflexion - right;Decreased dorsiflexion - left;Trunk flexed Gait velocity: decreased   General Gait Details: very slow gait speed and fatigues very easily, cues to redirect and maintain focus on task  Stairs            Wheelchair Mobility    Modified Rankin (Stroke Patients Only)       Balance Overall balance assessment: Needs assistance Sitting-balance support: Bilateral upper extremity supported;Feet supported Sitting balance-Leahy Scale: Fair Sitting balance - Comments: min guard for safety   Standing balance support: Bilateral upper extremity supported;During functional activity Standing balance-Leahy Scale: Poor Standing balance comment: reliant on external support                             Pertinent Vitals/Pain Pain Assessment: No/denies pain    Home Living Family/patient expects to be discharged to:: Assisted living               Home Equipment: Walker - 4 wheels Additional Comments: daughter is primary POA, daughter MSharyn Lullis a RTherapist, [facility] [place] has to do some cooking to season the food. Daughter would love for her to go back  to assisted living, [facility] does have rehab unit and assisted living part really only is able to provide meals and medication management    Prior Function Level of Independence: Independent with assistive device(s)         Comments: Pt reports incontinence and requiring use of  pads or depends.     Hand Dominance   Dominant Hand: Right    Extremity/Trunk Assessment   Upper Extremity Assessment Upper Extremity Assessment: Generalized weakness    Lower Extremity Assessment Lower Extremity Assessment: Generalized weakness    Cervical / Trunk Assessment Cervical / Trunk Assessment: Kyphotic  Communication   Communication: No difficulties  Cognition Arousal/Alertness: Awake/alert Behavior During Therapy: Flat affect Overall Cognitive Status: History of cognitive impairments - at baseline Area of Impairment: Orientation;Attention;Memory;Following commands;Safety/judgement;Awareness;Problem solving                 Orientation Level: Disoriented to;Situation;Time Current Attention Level: Sustained Memory: Decreased short-term memory Following Commands: Follows one step commands consistently;Follows one step commands with increased time Safety/Judgement: Decreased awareness of safety Awareness: Intellectual Problem Solving: Slow processing;Decreased initiation;Difficulty sequencing;Requires verbal cues;Requires tactile cues General Comments: very tangential but easily redirected, often starts talking about unrelated topics like "there was [place] the woods...." and disliking the food at her facility      General Comments General comments (skin integrity, edema, etc.): daughter present and observed session, assisted in comparison to baseline    Exercises     Assessment/Plan    PT Assessment Patient needs continued PT services  PT Problem List Decreased strength;Decreased cognition;Decreased knowledge of use of DME;Decreased activity tolerance;Decreased safety awareness;Decreased balance;Decreased mobility;Decreased coordination       PT Treatment Interventions DME instruction;Balance training;Gait training;Neuromuscular re-education;Stair training;Cognitive remediation;Functional mobility training;Therapeutic activities;Patient/family  education;Therapeutic exercise    PT Goals (Current goals can be found in the Care Plan section)  Acute Rehab PT Goals Patient Stated Goal: get stronger and get back to assisted living setting PT Goal Formulation: With patient/family Time For Goal Achievement: 06/02/19 Potential to Achieve Goals: Fair    Frequency Min 2X/week   Barriers to discharge        Co-evaluation               AM-PAC PT "6 Clicks" Mobility  Outcome Measure Help needed turning from your back to your side while in a flat bed without using bedrails?: A Little Help needed moving from lying on your back to sitting on the side of a flat bed without using bedrails?: A Lot Help needed moving to and from a bed to a chair (including a wheelchair)?: A Lot Help needed standing up from a chair using your arms (e.g., wheelchair or bedside chair)?: A Lot Help needed to walk in hospital room?: A Little Help needed climbing 3-5 steps with a railing? : A Lot 6 Click Score: 14    End of Session Equipment Utilized During Treatment: Gait belt Activity Tolerance: Patient limited by fatigue Patient left: in bed;with call bell/phone within reach;with bed alarm set;with family/visitor present   PT Visit Diagnosis: Unsteadiness on feet (R26.81);Difficulty in walking, not elsewhere classified (R26.2);Muscle weakness (generalized) (M62.81)    Time: 6010-9323 PT Time Calculation (min) (ACUTE ONLY): 45 min   Charges:   PT Evaluation $PT Eval Low Complexity: 1 Low PT Treatments $Gait Training: 8-22 mins $Therapeutic Activity: 8-22 mins        Windell Norfolk, DPT, PN1   Supplemental Physical Therapist [place]    Pager [phone]  Acute Rehab Office [phone]    

## 2019-05-19 NOTE — Discharge Summary (Deleted)
Physician Discharge Summary  [name] RUE:[id number] DOB: 07/20/1931 DOA: 05/18/2019  PCP: Kevan Ny, [place] date: 05/18/2019 Discharge date: 05/19/2019  Time spent: 35 minutes  Recommendations for Outpatient Follow-up:  PCP in 1 week Orthopedics in 2 weeks Continue physical therapy   Discharge Diagnoses:  Principal Problem:   AMS (altered mental status) Dementia Sundowning UTI versus colonization Sacral edema, likely traumatic   Hyperlipidemia   Hypertension   Lumbar radiculopathy, chronic   Dementia ([place])      Discharge Condition: Stable  Diet recommendation: Heart healthy  There were no vitals filed for this visit.  History of present illness:  HPI: [name] is a 85 y.o. female with medical history significant of RLS; PMR; panic d/o; CAD s/p CABG; multiple myeloma; depression; HTN; HLD; and dementia presenting with confusion.  She reports having a weird night.  She was waiting for her daughter and she doesn't know how she got there.  She has been in "lock down for 6 months."  "That's probably what's wrong with me, I thought I was losing my mind last night."  She was confused, and had difficulty sleeping.  She "felt like somebody from another world."  Her granddaughter reports that she was up all night.  She currently has a mild ache in her RUQ, adjacent to her right breast.  She reports being diagnosed with a UTI for the last 2 months  Hospital Course:   AMS with underlying dementia -Patient did not sleep at all 12/8 overnight and the following day had more confusion, tangential speech  -No fever or leukocytosis, no nausea vomiting, no changes in recent medications apart from intermittent antibiotics for presumed UTI  -CT head without acute findings, atrophy noted  -Suspect this is consistent with sundowning or delirium which can occur in patients with dementia  -Continue Zoloft -Mental status is improved, but back to baseline, patient  is able to carry a reasonable conversation, has good recollections of events in the remote past however does not remember things from earlier today or last couple of days -PT OT evaluation, determine if she is okay to go back to assisted living or requires higher level of care  Recurrent UTI versus colonization -11/25 urine culture with E coli -Repeat UA was abnormal,, received at least 2 courses of p.o. fosfomycin at the facility, has numerous antibiotic allergies -She has no fever, leukocytosis, no left shift, clinically suspect this is more of a colonization rather than true UTI -Fosfomycin was administered in the ER 12/9 -She has an upcoming appointment with infectious disease as well discussed numerous antibiotic allergies -I do not think further antibiotics are indicated at this time  Chronic LBP with radiculopathy -Continue Neurontin, Ultram -Started lidocaine patch, apparently recently started on nasal calcitonin, has not noticed any improvement yet, recent MRI notes sacral edema likely traumatic from recent fall -Physical therapy, continue above meds along with lidocaine patch -Continue to follow with orthopedics  HTN -Continue Coreg, hydralazine  H/o CAD -Continue ASA, Coreg, Imdur   Code Status:  DNR - confirmed with patient/family  Discharge Exam: Vitals:   05/18/19 2258 05/19/19 0824  BP: 138/69 (!) 174/65  Pulse: 81 79  Resp: 20 16  Temp: 98.8 F (37.1 C) 98.1 F (36.7 C)  SpO2: 97% 98%    General: Awake alert, oriented to self and place, partly to time, cognitive deficits noted Cardiovascular: S1-S2, regular rate rhythm Respiratory: Clear  Discharge Instructions    Allergies as of 05/19/2019  Reactions   Butazolidin [phenylbutazone] Swelling, Rash   Cephalosporins Other (See Comments)   Site of swelling not recalled   Levaquin [levofloxacin] Shortness Of Breath   Morphine And Related Other (See Comments)   Hallucinations   Trimethoprim  Nausea Only   Aleve [naproxen] Other (See Comments)   Was told by a MD to "never take this"   Buprenorphine Other (See Comments)   "Allergic," per [facility]   Cilostazol Other (See Comments)   Stomach upset   Codeine Nausea And Vomiting   Tolerates Tramadol   Dexilant [dexlansoprazole] Diarrhea   Diclofenac Other (See Comments)   Documented on MAR   Erythromycin Nausea And Vomiting   All "mycin" drugs   Lactose Intolerance (gi) Diarrhea   Macrodantin Other (See Comments)   Double vision Tolerates MacroBID   Morphine Other (See Comments)   Reaction not recalled   Simvastatin Other (See Comments)   Muscle aches   Sulfa Antibiotics Other (See Comments)   "Allergic," per [facility]   Vesicare [solifenacin] Other (See Comments)   Reaction not recalled by the patient- gets a lot of UTI(s)   Clindamycin/lincomycin Other (See Comments)   Upsets the stomach   Doxycycline Diarrhea   Food Other (See Comments)   No spicy foods or black pepper, raw onions - causes gerd   Ibuprofen Hives, Rash   Was told by a MD to "never take this"   Lincomycin Hcl Nausea Only, Other (See Comments)   Upsets the stomach   Penicillins Rash   Has patient had a PCN reaction causing immediate rash, facial/tongue/throat swelling, SOB or lightheadedness with hypotension: Yes Has patient had a PCN reaction causing severe rash involving mucus membranes or skin necrosis: No Has patient had a PCN reaction that required hospitalization No Has patient had a PCN reaction occurring within the last 10 years: No If all of the above answers are "NO", then may proceed with Cephalosporin use.   Toviaz [fesoterodine Fumarate Er] Other (See Comments)   Reaction not recalled   Voltaren [diclofenac Sodium] Rash      Medication List    TAKE these medications   acetaminophen 650 MG CR tablet Commonly known as: TYLENOL Take 1,300 mg by mouth 2 (two) times daily.   aspirin EC 81 MG tablet Take 1 tablet (81 mg total) by mouth  daily.   Baclofen 5 MG Tabs Take 5 mg by mouth 3 (three) times daily as needed for muscle spasms.   Calcium 600+D3 600-200 MG-UNIT Tabs Generic drug: Calcium Carb-Cholecalciferol Take 1 tablet by mouth 2 (two) times daily.   carvedilol 3.125 MG tablet Commonly known as: Coreg Take 1 tablet (3.125 mg total) by mouth 2 (two) times daily with a meal.   Centrum Silver 50+Women Tabs Take 1 tablet by mouth daily.   cholestyramine 4 g packet Commonly known as: QUESTRAN Take 4 g by mouth as directed. 1/2 pack every 2 days   cycloSPORINE 0.05 % ophthalmic emulsion Commonly known as: RESTASIS Place 1 drop into both eyes 2 (two) times daily.   D-Mannose Powd Take 1 Scoop by mouth daily.   docusate sodium 100 MG capsule Commonly known as: COLACE Take 100 mg by mouth See admin instructions. Take 100 mg by mouth once a day and an additional 100 mg as needed for constipation   Ellura 200 MG Caps Generic drug: Cranberry Take 200 mg by mouth daily.   famotidine 20 MG tablet Commonly known as: PEPCID Take 1 tablet (20 mg total) by mouth  2 (two) times daily.   furosemide 20 MG tablet Commonly known as: LASIX Take 1 tablet (20 mg total) by mouth daily.   gabapentin 100 MG capsule Commonly known as: NEURONTIN Take 100 mg by mouth 2 (two) times daily.   Gaviscon Extra Strength 508-475 MG/10ML Susp Generic drug: Alum Hydroxide-Mag Carbonate Take 15 mLs by mouth 4 (four) times daily as needed (for reflux).   hydrALAZINE 10 MG tablet Commonly known as: APRESOLINE Take 1 tablet (10 mg total) by mouth daily at 3 pm. What changed:   when to take this  additional instructions   isosorbide mononitrate 60 MG 24 hr tablet Commonly known as: IMDUR Take 60 mg by mouth daily.   lidocaine 5 % Commonly known as: LIDODERM Place 1 patch onto the skin daily. Remove & Discard patch within 12 hours or as directed by MD   nitroGLYCERIN 0.4 MG SL tablet Commonly known as: NITROSTAT Place 1  tablet (0.4 mg total) under the tongue every 5 (five) minutes as needed for chest pain. Reported on 11/12/2015   Nu-Iron 150 MG capsule Generic drug: iron polysaccharides Take 150 mg by mouth 2 (two) times daily.   polyethylene glycol 17 g packet Commonly known as: MIRALAX / GLYCOLAX Take 17 g by mouth 2 (two) times daily. What changed: when to take this   prednisoLONE acetate 1 % ophthalmic suspension Commonly known as: PRED FORTE Place 1 drop into both eyes 2 (two) times daily as needed (irritation).   psyllium 95 % Pack Commonly known as: HYDROCIL/METAMUCIL Take 1 packet by mouth 2 (two) times daily as needed for mild constipation or moderate constipation.   sertraline 50 MG tablet Commonly known as: ZOLOFT Take 50 mg by mouth every evening.   sertraline 25 MG tablet Commonly known as: ZOLOFT Take 25 mg by mouth daily.   Soothe XP Soln Place 1 drop into both eyes 2 (two) times daily.   Systane Balance 0.6 % Soln Generic drug: Propylene Glycol Place 1 drop into both eyes 4 (four) times daily as needed (irritation).   traMADol 50 MG tablet Commonly known as: ULTRAM Take 50 mg by mouth every 8 (eight) hours as needed for moderate pain.      Allergies  Allergen Reactions  . Butazolidin [Phenylbutazone] Swelling and Rash  . Cephalosporins Other (See Comments)    Site of swelling not recalled  . Levaquin [Levofloxacin] Shortness Of Breath  . Morphine And Related Other (See Comments)    Hallucinations   . Trimethoprim Nausea Only  . Aleve [Naproxen] Other (See Comments)    Was told by a MD to "never take this"  . Buprenorphine Other (See Comments)    "Allergic," per [facility]  . Cilostazol Other (See Comments)    Stomach upset  . Codeine Nausea And Vomiting    Tolerates Tramadol  . Dexilant [Dexlansoprazole] Diarrhea  . Diclofenac Other (See Comments)    Documented on MAR  . Erythromycin Nausea And Vomiting    All "mycin" drugs  . Lactose Intolerance (Gi) Diarrhea   . Macrodantin Other (See Comments)    Double vision Tolerates MacroBID  . Morphine Other (See Comments)    Reaction not recalled  . Simvastatin Other (See Comments)    Muscle aches   . Sulfa Antibiotics Other (See Comments)    "Allergic," per [facility]  . Vesicare [Solifenacin] Other (See Comments)    Reaction not recalled by the patient- gets a lot of UTI(s)  . Clindamycin/Lincomycin Other (See Comments)  Upsets the stomach  . Doxycycline Diarrhea  . Food Other (See Comments)    No spicy foods or black pepper, raw onions - causes gerd  . Ibuprofen Hives and Rash    Was told by a MD to "never take this"  . Lincomycin Hcl Nausea Only and Other (See Comments)    Upsets the stomach  . Penicillins Rash    Has patient had a PCN reaction causing immediate rash, facial/tongue/throat swelling, SOB or lightheadedness with hypotension: Yes Has patient had a PCN reaction causing severe rash involving mucus membranes or skin necrosis: No Has patient had a PCN reaction that required hospitalization No Has patient had a PCN reaction occurring within the last 10 years: No If all of the above answers are "NO", then may proceed with Cephalosporin use.   Lisbeth Ply [Fesoterodine Fumarate Er] Other (See Comments)    Reaction not recalled  . Voltaren [Diclofenac Sodium] Rash      The results of significant diagnostics from this hospitalization (including imaging, microbiology, ancillary and laboratory) are listed below for reference.    Significant Diagnostic Studies: DG Chest 2 View  Result Date: 05/04/2019 CLINICAL DATA:  85 year old female status post fall. EXAM: CHEST - 2 VIEW COMPARISON:  Chest radiographs 04/28/2018 and earlier. FINDINGS: AP supine and supine lateral views of the chest. Stable mediastinal contours. Prior CABG. Visualized tracheal air column is within normal limits. Lower lung volumes. No pneumothorax, pulmonary edema, pleural effusion or acute pulmonary opacity. Calcified  aortic atherosclerosis. Osteopenia. Stable visualized osseous structures. Paucity of bowel gas in the upper abdomen. IMPRESSION: 1. No acute cardiopulmonary abnormality or acute traumatic injury identified. 2. Aortic Atherosclerosis (ICD10-I70.0). Electronically Signed   By: Genevie Ann M.D.   On: 05/04/2019 04:57   CT Head Wo Contrast  Result Date: 05/18/2019 CLINICAL DATA:  Altered level of consciousness EXAM: CT HEAD WITHOUT CONTRAST TECHNIQUE: Contiguous axial images were obtained from the base of the skull through the vertex without intravenous contrast. COMPARISON:  CT Oct 28, 2015 FINDINGS: Brain: Stable region of gliosis/encephalomalacia in the right frontal lobe No evidence of acute infarction, hemorrhage, hydrocephalus, extra-axial collection or mass lesion/mass effect. Symmetric prominence of the ventricles, cisterns and sulci compatible with parenchymal volume loss. Patchy areas of white matter hypoattenuation are most compatible with chronic microvascular angiopathy. Vascular: Atherosclerotic calcification of the carotid siphons and intradural vertebral arteries. No hyperdense vessel. Skull: No calvarial fracture or suspicious osseous lesion. No scalp swelling or hematoma. Sinuses/Orbits: Paranasal sinuses and mastoid air cells are predominantly clear. Orbital structures are unremarkable aside from prior lens extractions. Other: None IMPRESSION: 1. No acute intracranial abnormality. 2. Stable region of gliosis/encephalomalacia in the right frontal lobe. 3. Stable parenchymal volume loss and chronic microvascular angiopathy. Electronically Signed   By: Lovena Le M.D.   On: 05/18/2019 20:14   MR Lumbar Spine w/o contrast  Result Date: 05/17/2019 CLINICAL DATA:  Fall, evaluate for L3 compression fracture EXAM: MRI LUMBAR SPINE WITHOUT CONTRAST TECHNIQUE: Multiplanar, multisequence MR imaging of the lumbar spine was performed. No intravenous contrast was administered. COMPARISON:  06/30/2018  FINDINGS: Motion artifact is present. Segmentation:  Standard. Alignment: Mild sigmoid curvature. There is grade 1 anterolisthesis at L5-S1. Vertebrae: Vertebral body heights are maintained apart from mild degenerative endplate irregularity. No significant marrow edema or suspicious osseous lesion. There is bilateral sacral marrow edema. Conus medullaris and cauda equina: Conus extends to the L1 level. Conus and cauda equina appear normal. Paraspinal and other soft tissues: Interval atrophy of the  left psoas muscle. Disc levels: L1-L2: Disc bulge with endplate osteophytic ridging eccentric to the right. Similar minor canal stenosis. Increased mild to moderate right foraminal stenosis. Similar minor left foraminal stenosis. L2-L3: Disc bulge with endplate osteophytic ridging and facet arthropathy with ligamentum flavum infolding. Mild canal stenosis is similar. Similar mild foraminal stenosis. L3-L4: Disc bulge with endplate osteophytic ridging and facet arthropathy with ligamentum flavum infolding. Similar mild canal stenosis. Similar mild foraminal stenosis. L4-L5: Disc bulge with endplate osteophytic ridging and facet arthropathy with ligamentum flavum infolding. Similar marked canal stenosis with narrowing of the lateral recesses. Similar mild to moderate foraminal stenosis. L5-S1: Anterolisthesis with disc uncovering and facet arthropathy with ligamentum flavum infolding similar mild canal stenosis. Similar mild foraminal stenosis. IMPRESSION: No evidence of recent compression fracture. There is partially imaged bilateral sacral marrow edema, right greater than left, which could reflect traumatic injury. Multilevel degenerative changes as detailed above with suboptimal stenosis evaluation due to motion artifact. Canal stenosis remains greatest at L4-L5. Electronically Signed   By: Macy Mis M.D.   On: 05/17/2019 17:22   DG HIPS BILAT WITH PELVIS 3-4 VIEWS  Result Date: 05/18/2019 CLINICAL DATA:   BILATERAL hip pain after falling EXAM: DG HIP (WITH OR WITHOUT PELVIS) 3-4V BILAT COMPARISON:  05/04/2019 Correlation: MR LEFT hip 06/30/2018, LEFT hip radiographs 06/30/2018 FINDINGS: Osseous demineralization. Hip and SI joint spaces preserved. Soft tissue calcification medial to the proximal LEFT femur new since 06/30/2018 radiographs likely represents ossification in the torn distal LEFT iliopsoas tendon which was noted on prior MR. No acute fracture, dislocation, or bone destruction. IMPRESSION: Osseous demineralization without acute abnormalities. Ossification at previously torn distal LEFT iliopsoas tendon. Electronically Signed   By: Lavonia Dana M.D.   On: 05/18/2019 18:43   DG Hips Bilat W or Wo Pelvis 3-4 Views  Result Date: 05/04/2019 CLINICAL DATA:  85 year old female status post fall. EXAM: DG HIP (WITH OR WITHOUT PELVIS) 3-4V BILAT COMPARISON:  Left hip series 06/30/2018. Left hip MRI 06/30/2018. FINDINGS: New, elongated 6-7 centimeter confluent soft tissue calcification along the medial proximal left femur is felt to be myositis ossificans related to the injury of the iliopsoas muscle insertion demonstrated by MRI in January. The femoral heads remain normally located. Hip joint spaces appear stable. No superimposed proximal left femur fracture identified. Elsewhere the pelvis appears stable and intact. Chronic left inguinal surgical clips. Proximal right femur appears intact. Negative visible bowel gas pattern. SI joints remain normal. IMPRESSION: 1. 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 2. No superimposed acute fracture or dislocation identified about the bilateral hips or pelvis. If occult hip fracture is suspected or if the patient is unable to weightbear, MRI is the preferred modality for further evaluation. Electronically Signed   By: Genevie Ann M.D.   On: 05/04/2019 04:55     Microbiology: Recent Results (from the past 240 hour(s))  SARS CORONAVIRUS 2 (TAT 6-24 HRS) Nasopharyngeal Nasopharyngeal Swab     Status: None   Collection Time: 05/18/19  2:10 PM   Specimen: Nasopharyngeal Swab  Result Value Ref Range Status   SARS Coronavirus 2 NEGATIVE NEGATIVE Final    Comment: (NOTE) SARS-CoV-2 target nucleic acids are NOT DETECTED. The SARS-CoV-2 RNA is generally detectable in upper and lower respiratory specimens during the acute phase of infection. Negative results do not preclude SARS-CoV-2 infection, do not rule out co-infections with other pathogens, and should not be used  as the sole basis for treatment or other patient management decisions. Negative results must be combined with clinical observations, patient history, and epidemiological information. The expected result is Negative. Fact Sheet for Patients: SugarRoll.be Fact Sheet for Healthcare Providers: https://www.woods-mathews.com/ This test is not yet approved or cleared by the [place] FDA and  has been authorized for detection and/or diagnosis of SARS-CoV-2 by FDA under an Emergency Use Authorization (EUA). This EUA will remain  in effect (meaning this test can be used) for the duration of the COVID-19 declaration under Section 56 4(b)(1) of the Act, 21 U.S.C. section 360bbb-3(b)(1), unless the authorization is terminated or revoked sooner. Performed at [facility], [place] [address] [place], [place] [address]   Wet prep, genital     Status: Abnormal   Collection Time: 05/18/19  3:27 PM  Result Value Ref Range Status   Yeast Wet Prep HPF POC NONE SEEN NONE SEEN Final   Trich, Wet Prep NONE SEEN NONE SEEN Final   Clue Cells Wet Prep HPF POC NONE SEEN NONE SEEN Final   WBC, Wet Prep HPF POC MODERATE (A) NONE SEEN Final   Sperm NONE SEEN  Final    Comment: Performed at [facility], [place] [address] [place], [place] [address]      Labs: Basic Metabolic Panel: Recent Labs  Lab 05/18/19 1029 05/19/19 0246  NA 132* 135  K 3.3* 3.1*  CL 94* 100  CO2 25 26  GLUCOSE 98 93  BUN 14 12  CREATININE 0.55 0.55  CALCIUM 9.3 8.6*   Liver Function Tests: No results for input(s): AST, ALT, ALKPHOS, BILITOT, PROT, ALBUMIN in the last 168 hours. No results for input(s): LIPASE, AMYLASE in the last 168 hours. No results for input(s): AMMONIA in the last 168 hours. CBC: Recent Labs  Lab 05/18/19 1029 05/19/19 0246  WBC 4.9 4.2  NEUTROABS 3.5  --   HGB 11.2* 9.4*  HCT 33.6* 27.3*  MCV 99.7 97.2  PLT 442* 445*   Cardiac Enzymes: No results for input(s): CKTOTAL, CKMB, CKMBINDEX, TROPONINI in the last 168 hours. BNP: BNP (last 3 results) No results for input(s): BNP in the last 8760 hours.  ProBNP (last 3 results) No results for input(s): PROBNP in the last 8760 hours.  CBG: No results for input(s): GLUCAP in the last 168 hours.     Signed:  Domenic Polite MD.  [facility] 05/19/2019, 11:31 AM

## 2019-05-19 NOTE — TOC Initial Note (Addendum)
Transition of Care [facility]) - Initial/Assessment Note    Patient Details  Name: [name] MRN: [id number] Date of Birth: Jul 03, 1931  Transition of Care [facility]) CM/SW Contact:    Sharin Mons, RN Phone Number: 05/19/2019, 2:37 PM  Clinical Narrative: Admitted for AMS with underlying dementia, recurrent UTI. PMH CAD, chronic fatigue, dementia, CABG, HLD, HTN, multiple myeloma, NSTEMI, panic disorder, bunionectomy                 Pt is from [facility]. Pt is now requiring a higher level of care @ d/c , SNF/ rehab, per PT's evaluation.  NCM received consult for possible SNF placement at time of discharge. NCM spoke with patient's daughter  regarding PT recommendation of SNF placement at time of discharge. [name] is agreeable for mom to d/c to SNF/ rehab. given patient's current physical needs and fall risk. [name] reports preference for [facility] SNF/ rehab. NCM discussed insurance authorization process and provided Medicare SNF ratings list. No further questions asked.    Sandria Senter (Daughter)     [phone]      Referral made in [place] with [facility] for bed offer...and NCM called [facility] to speak with admission director. Call unsuccessful. Voicemessage left....awaiting return call.   Authorization for ambulance transportation in process ....  NCM to continue to follow and assist with discharge planning needs.   Expected Discharge Plan: Skilled Nursing Facility Barriers to Discharge: SNF Pending bed offer   Patient Goals and CMS Choice        Expected Discharge Plan and Services Expected Discharge Plan: [place]         Prior Living Arrangements/Services                       Activities of Daily Living      Permission Sought/Granted                  Emotional Assessment              Admission diagnosis:  Fall [W19.XXXA] Patient Active Problem List   Diagnosis Date Noted  . AMS (altered mental status)  05/18/2019  . Recurrent urinary tract infection 05/18/2019  . Left hip pain 06/30/2018  . Disc degeneration, lumbar 06/23/2018  . Left thyroid nodule 08/29/2017  . Chronic idiopathic constipation 08/18/2017  . Abdominal pain 07/31/2017  . Mesenteric ischemia ([place]) 05/04/2017  . Unsteadiness on feet   . Unstable angina pectoris ([place])   . Tinea pedis   . Spinal stenosis of lumbar region   . Somnolence   . Seasonal allergic rhinitis   . Restless legs syndrome   . Raynaud's syndrome   . Raynauds syndrome   . Radiculopathy   . Panic disorder   . Ovarian cyst, bilateral   . Osteopenia   . Myocardial infarction ([place])   . Muscle weakness (generalized)   . Mixed incontinence   . Mild chronic anemia   . Major depressive disorder   . Long term (current) use of aspirin   . Intermediate coronary syndrome ([place])   . Incontinent of urine   . IBS (irritable bowel syndrome)   . Hypercholesteremia   . Hx of CABG   . GERD (gastroesophageal reflux disease)   . Dementia ([place])   . Chest pain   . Atherosclerotic heart disease of native coronary artery without angina pectoris   . Arthritis   . Anxiety   . Allergic rhinitis   . Abnormal weight  loss   . History of adenomatous polyp of colon 03/02/2017  . NSTEMI (non-ST elevated myocardial infarction) ([place]) 02/02/2017  . SOB (shortness of breath) 09/02/2016  . Dizziness 09/02/2016  . Non-ST elevation (NSTEMI) myocardial infarction ([place]) 11/26/2015  . Polymyalgia rheumatica ([place]) 11/26/2015  . Hypokalemia 11/26/2015  . Mild persistent asthma 11/12/2015  . Coronary artery disease involving autologous artery coronary bypass graft with angina pectoris with documented spasm ([place]) 11/12/2015  . Multiple myeloma in remission ([place]) 11/12/2015  . Current use of beta blocker 11/12/2015  . Gastroesophageal reflux disease without esophagitis 11/12/2015  . Other allergic rhinitis 11/12/2015  . Weight loss, non-intentional 10/03/2015  . Asthma 09/24/2015   . Asthma in adult without complication 97/35/3299  . Seasonal allergic rhinitis due to pollen 09/24/2015  . Cervical radiculopathy 07/25/2015  . Chronic coronary artery disease 07/25/2015  . Coronary artery disease 07/25/2015  . Disturbance in sleep behavior 07/25/2015  . Elevated CK 07/25/2015  . Elevated creatine kinase level 07/25/2015  . Generalized osteoarthritis of hand 07/25/2015  . Other long term (current) drug therapy 07/25/2015  . Overactive bladder 07/25/2015  . Restless leg 07/25/2015  . Restless leg syndrome 07/25/2015  . Sleep disturbances 07/25/2015  . Idiopathic peripheral neuropathy 07/04/2015  . Lumbar radiculopathy, chronic 07/04/2015  . Mixed dementia ([place]) 07/04/2015  . Risk for falls 07/04/2015  . Sleep disturbance 07/04/2015  . Chest pain with moderate risk for cardiac etiology 06/13/2015  . Right sciatic nerve pain 10/19/2014  . Abnormal thyroid blood test 08/25/2014  . Chronic fatigue 08/09/2014  . Multiple myeloma ([place]) 07/21/2013  . Depression 01/14/2013  . Generalized anxiety disorder 01/14/2013  . Gastroesophageal reflux disease 01/13/2013  . Urine test positive for microalbuminuria 12/08/2011  . Cyst of ovary 12/17/2010  . KNEE PAIN, BILATERAL 08/15/2010  . Abnormal gait 08/15/2010  . Atherosclerosis of coronary artery bypass graft 10/11/2009  . CHEST PAIN, NON-CARDIAC 10/11/2009  . HIP PAIN, RIGHT 08/13/2009  . GREATER TROCHANTERIC BURSITIS 08/13/2009  . LUMBOSACRAL SPONDYLOSIS WITHOUT MYELOPATHY 11/10/2008  . Osteoarthritis of lumbosacral spine without myelopathy 11/10/2008  . FOOT PAIN, BILATERAL 06/30/2008  . Hyperlipidemia 06/22/2008  . Hypertension 06/22/2008  . Raynaud's disease 06/22/2008  . BURSITIS, LEFT SHOULDER 06/22/2008   PCP:  [place], [place]:   [place], [place] - [address] [address] Phone: [phone] Fax: [phone]     Social  Determinants of Health (SDOH) Interventions    Readmission Risk Interventions No flowsheet data found.

## 2019-05-19 NOTE — NC FL2 (Signed)
[place] MEDICAID FL2 LEVEL OF CARE SCREENING TOOL     IDENTIFICATION  Patient Name: [name] Birthdate: 11-01-1931 Sex: female Admission Date (Current Location): 05/18/2019  [facility] and [place] Number:  Herbalist and Address:  [facility], [place] [address] [place], [place] [address]      Provider Number: [id number]  Attending Physician Name and Address:  Domenic Polite, MD  Relative Name and Phone Number:       Current Level of Care: Hospital Recommended Level of Care: [place] Prior Approval Number:    Date Approved/Denied:   PASRR Number: [id number]  Discharge Plan: SNF    Current Diagnoses: Patient Active Problem List   Diagnosis Date Noted  . AMS (altered mental status) 05/18/2019  . Recurrent urinary tract infection 05/18/2019  . Left hip pain 06/30/2018  . Disc degeneration, lumbar 06/23/2018  . Left thyroid nodule 08/29/2017  . Chronic idiopathic constipation 08/18/2017  . Abdominal pain 07/31/2017  . Mesenteric ischemia ([place]) 05/04/2017  . Unsteadiness on feet   . Unstable angina pectoris ([place])   . Tinea pedis   . Spinal stenosis of lumbar region   . Somnolence   . Seasonal allergic rhinitis   . Restless legs syndrome   . Raynaud's syndrome   . Raynauds syndrome   . Radiculopathy   . Panic disorder   . Ovarian cyst, bilateral   . Osteopenia   . Myocardial infarction ([place])   . Muscle weakness (generalized)   . Mixed incontinence   . Mild chronic anemia   . Major depressive disorder   . Long term (current) use of aspirin   . Intermediate coronary syndrome ([place])   . Incontinent of urine   . IBS (irritable bowel syndrome)   . Hypercholesteremia   . Hx of CABG   . GERD (gastroesophageal reflux disease)   . Dementia ([place])   . Chest pain   . Atherosclerotic heart disease of native coronary artery without angina pectoris   . Arthritis   . Anxiety   . Allergic rhinitis   . Abnormal  weight loss   . History of adenomatous polyp of colon 03/02/2017  . NSTEMI (non-ST elevated myocardial infarction) ([place]) 02/02/2017  . SOB (shortness of breath) 09/02/2016  . Dizziness 09/02/2016  . Non-ST elevation (NSTEMI) myocardial infarction ([place]) 11/26/2015  . Polymyalgia rheumatica ([place]) 11/26/2015  . Hypokalemia 11/26/2015  . Mild persistent asthma 11/12/2015  . Coronary artery disease involving autologous artery coronary bypass graft with angina pectoris with documented spasm ([place]) 11/12/2015  . Multiple myeloma in remission ([place]) 11/12/2015  . Current use of beta blocker 11/12/2015  . Gastroesophageal reflux disease without esophagitis 11/12/2015  . Other allergic rhinitis 11/12/2015  . Weight loss, non-intentional 10/03/2015  . Asthma 09/24/2015  . Asthma in adult without complication 17/00/1749  . Seasonal allergic rhinitis due to pollen 09/24/2015  . Cervical radiculopathy 07/25/2015  . Chronic coronary artery disease 07/25/2015  . Coronary artery disease 07/25/2015  . Disturbance in sleep behavior 07/25/2015  . Elevated CK 07/25/2015  . Elevated creatine kinase level 07/25/2015  . Generalized osteoarthritis of hand 07/25/2015  . Other long term (current) drug therapy 07/25/2015  . Overactive bladder 07/25/2015  . Restless leg 07/25/2015  . Restless leg syndrome 07/25/2015  . Sleep disturbances 07/25/2015  . Idiopathic peripheral neuropathy 07/04/2015  . Lumbar radiculopathy, chronic 07/04/2015  . Mixed dementia ([place]) 07/04/2015  . Risk for falls 07/04/2015  . Sleep disturbance 07/04/2015  .  Chest pain with moderate risk for cardiac etiology 06/13/2015  . Right sciatic nerve pain 10/19/2014  . Abnormal thyroid blood test 08/25/2014  . Chronic fatigue 08/09/2014  . Multiple myeloma (HCC) 07/21/2013  . Depression 01/14/2013  . Generalized anxiety disorder 01/14/2013  . Gastroesophageal reflux disease 01/13/2013  . Urine test positive for microalbuminuria  12/08/2011  . Cyst of ovary 12/17/2010  . KNEE PAIN, BILATERAL 08/15/2010  . Abnormal gait 08/15/2010  . Atherosclerosis of coronary artery bypass graft 10/11/2009  . CHEST PAIN, NON-CARDIAC 10/11/2009  . HIP PAIN, RIGHT 08/13/2009  . GREATER TROCHANTERIC BURSITIS 08/13/2009  . LUMBOSACRAL SPONDYLOSIS WITHOUT MYELOPATHY 11/10/2008  . Osteoarthritis of lumbosacral spine without myelopathy 11/10/2008  . FOOT PAIN, BILATERAL 06/30/2008  . Hyperlipidemia 06/22/2008  . Hypertension 06/22/2008  . Raynaud's disease 06/22/2008  . BURSITIS, LEFT SHOULDER 06/22/2008    Orientation RESPIRATION BLADDER Height & Weight     Self, Time, Situation, Place  Normal Incontinent, External catheter Weight:   Height:     BEHAVIORAL SYMPTOMS/MOOD NEUROLOGICAL BOWEL NUTRITION STATUS      Continent Diet(Regular, low sodium)  AMBULATORY STATUS COMMUNICATION OF NEEDS Skin   Limited Assist Verbally Normal                       Personal Care Assistance Level of Assistance  Bathing, Feeding, Dressing Bathing Assistance: Limited assistance Feeding assistance: Independent Dressing Assistance: Limited assistance     Functional Limitations Info  Sight Sight Info: Impaired        SPECIAL CARE FACTORS FREQUENCY  PT (By licensed PT), OT (By licensed OT)     PT Frequency: 5x OT Frequency: 5x            Contractures Contractures Info: Not present    Additional Factors Info  Code Status, Allergies, Psychotropic Code Status Info: DNR Allergies Info: Butazolidin (Phenylbutazone), Cephalosporins, Levaquin (Levofloxacin), Morphine And Related, Trimethoprim, Aleve (Naproxen), Buprenorphine, Cilostazol, Codeine, Dexilant (Dexlansoprazole), Diclofenac, Erythromycin, Lactose Intolerance (Gi), Macrodantin, Morphine, Simvastatin, Sulfa Antibiotics, Vesicare (Solifenacin), Clindamycin/lincomycin, Doxycycline, Food, Ibuprofen, Lincomycin Hcl, Penicillins, Toviaz (Fesoterodine Fumarate Er), Voltaren  (Diclofenac Sodium) Psychotropic Info: Zoloft         Current Medications (05/19/2019):  This is the current hospital active medication list Current Facility-Administered Medications  Medication Dose Route Frequency Provider Last Rate Last Admin  . acetaminophen (TYLENOL) tablet 650 mg  650 mg Oral Q6H PRN Yates, Jennifer, MD       Or  . acetaminophen (TYLENOL) suppository 650 mg  650 mg Rectal Q6H PRN Yates, Jennifer, MD      . aspirin EC tablet 81 mg  81 mg Oral Daily Yates, Jennifer, MD   81 mg at 05/19/19 0908  . baclofen (LIORESAL) tablet 5 mg  5 mg Oral TID PRN Yates, Jennifer, MD      . carvedilol (COREG) tablet 3.125 mg  3.125 mg Oral BID WC Yates, Jennifer, MD   3.125 mg at 05/19/19 0908  . [START ON 05/20/2019] cholestyramine (QUESTRAN) packet 2 g  2 g Oral QODAY Yates, Jennifer, MD      . cycloSPORINE (RESTASIS) 0.05 % ophthalmic emulsion 1 drop  1 drop Both Eyes BID Yates, Jennifer, MD   1 drop at 05/19/19 0909  . enoxaparin (LOVENOX) injection 40 mg  40 mg Subcutaneous Q24H Yates, Jennifer, MD   40 mg at 05/18/19 2156  . famotidine (PEPCID) tablet 20 mg  20 mg Oral BID Yates, Jennifer, MD   20 mg at 05/19/19 0908  .   gabapentin (NEURONTIN) capsule 100 mg  100 mg Oral BID Karmen Bongo, MD   100 mg at 05/19/19 0908  . hydrALAZINE (APRESOLINE) tablet 10 mg  10 mg Oral Q24H Donnamae Jude, RPH   10 mg at 05/19/19 1506  . hydrALAZINE (APRESOLINE) tablet 10 mg  10 mg Oral Q12H PRN Donnamae Jude, RPH      . isosorbide mononitrate (IMDUR) 24 hr tablet 60 mg  60 mg Oral Daily Karmen Bongo, MD   60 mg at 05/19/19 0908  . lidocaine (LIDODERM) 5 % 1 patch  1 patch Transdermal Q24H Karmen Bongo, MD   1 patch at 05/18/19 2157  . ondansetron (ZOFRAN) tablet 4 mg  4 mg Oral Q6H PRN Karmen Bongo, MD       Or  . ondansetron [facility]) injection 4 mg  4 mg Intravenous Q6H PRN Karmen Bongo, MD      . polyethylene glycol (MIRALAX / GLYCOLAX) packet 17 g  17 g Oral Daily Karmen Bongo,  MD   17 g at 05/19/19 0909  . polyvinyl alcohol (LIQUIFILM TEARS) 1.4 % ophthalmic solution 1 drop  1 drop Both Eyes BID Karmen Bongo, MD   1 drop at 05/19/19 0909  . polyvinyl alcohol (LIQUIFILM TEARS) 1.4 % ophthalmic solution 1 drop  1 drop Both Eyes QID PRN Karmen Bongo, MD      . prednisoLONE acetate (PRED FORTE) 1 % ophthalmic suspension 1 drop  1 drop Both Eyes BID PRN Karmen Bongo, MD      . sertraline (ZOLOFT) tablet 25 mg  25 mg Oral Daily Karmen Bongo, MD   25 mg at 05/19/19 0908  . sertraline (ZOLOFT) tablet 50 mg  50 mg Oral Ivery Quale, MD   50 mg at 05/18/19 2134  . traMADol (ULTRAM) tablet 50 mg  50 mg Oral Q8H PRN Karmen Bongo, MD         Discharge Medications: Please see discharge summary for a list of discharge medications.  Relevant Imaging Results:  Relevant Lab Results:   Additional Information SSN: [id number]       COVID Negatve on 05/18/19  Benard Halsted, LCSW

## 2019-05-19 NOTE — Discharge Summary (Addendum)
Physician Discharge Summary  [name] QZE:[id number] DOB: 1932-01-02 DOA: 05/18/2019  PCP: Kevan Ny, [place] date: 05/18/2019 Discharge date: 05/20/2019  Time spent: 35 minutes  Recommendations for Outpatient Follow-up:  PCP in 1 week Orthopedics in 2 weeks Continue physical therapy   Discharge Diagnoses:  Principal Problem:   AMS (altered mental status) Dementia Sundowning UTI versus colonization Sacral edema, likely traumatic   Hyperlipidemia   Hypertension   Lumbar radiculopathy, chronic   Do not resuscitate       Discharge Condition: Stable  Diet recommendation: Heart healthy  There were no vitals filed for this visit.  History of present illness:  HPI: [name] is a 85 y.o. female with medical history significant of RLS; PMR; panic d/o; CAD s/p CABG; multiple myeloma; depression; HTN; HLD; and dementia presenting with confusion.  She reports having a weird night.  She was waiting for her daughter and she doesn't know how she got there.  She has been in "lock down for 6 months."  "That's probably what's wrong with me, I thought I was losing my mind last night."  She was confused, and had difficulty sleeping.  She "felt like somebody from another world."  Her granddaughter reports that she was up all night.  She currently has a mild ache in her RUQ, adjacent to her right breast.  She reports being diagnosed with a UTI for the last 2 months  Hospital Course:   AMS with underlying dementia -Patient did not sleep at all 12/8 overnight and the following day had more confusion, tangential speech  -No fever or leukocytosis, no nausea vomiting, no changes in recent medications apart from intermittent antibiotics for presumed UTI  -CT head without acute findings, atrophy noted  -Suspect this is consistent with sundowning or delirium which can occur in patients with dementia  -Continue Zoloft -Mental status is improved, but back to baseline,  patient is able to carry a reasonable conversation, has good recollections of events in the remote past however does not remember things from earlier today or last couple of days -PT OT evaluation completed, SNF recommended for rehab  Recurrent UTI versus colonization -11/25 urine culture with E coli -Repeat UA was abnormal,, received at least 2 courses of p.o. fosfomycin at the facility, has numerous antibiotic allergies -She has no fever, leukocytosis, no left shift, clinically suspect this is more of a colonization rather than true UTI -Fosfomycin was administered in the ER 12/9 -She has an upcoming appointment with infectious disease as well discussed numerous antibiotic allergies -I do not think further antibiotics are indicated at this time  Chronic LBP with radiculopathy -Continue Neurontin, Ultram -Started lidocaine patch, apparently recently started on nasal calcitonin, has not noticed any improvement yet, recent MRI notes sacral edema likely traumatic from recent fall -Physical therapy, continue above meds along with lidocaine patch -Continue to follow with orthopedics  HTN -Continue Coreg, hydralazine  H/o CAD -Continue ASA, Coreg, Imdur   Code Status:  DNR   Discharge Exam: Vitals:   05/18/19 2258 05/19/19 0824  BP: 138/69 (!) 174/65  Pulse: 81 79  Resp: 20 16  Temp: 98.8 F (37.1 C) 98.1 F (36.7 C)  SpO2: 97% 98%    General: Awake alert, oriented to self and place, partly to time, cognitive deficits noted Cardiovascular: S1-S2, regular rate rhythm Respiratory: Clear  Discharge Instructions    Allergies as of 05/19/2019      Reactions   Butazolidin [phenylbutazone] Swelling, Rash  Cephalosporins Other (See Comments)   Site of swelling not recalled   Levaquin [levofloxacin] Shortness Of Breath   Morphine And Related Other (See Comments)   Hallucinations   Trimethoprim Nausea Only   Aleve [naproxen] Other (See Comments)   Was told by a MD to  "never take this"   Buprenorphine Other (See Comments)   "Allergic," per [facility]   Cilostazol Other (See Comments)   Stomach upset   Codeine Nausea And Vomiting   Tolerates Tramadol   Dexilant [dexlansoprazole] Diarrhea   Diclofenac Other (See Comments)   Documented on MAR   Erythromycin Nausea And Vomiting   All "mycin" drugs   Lactose Intolerance (gi) Diarrhea   Macrodantin Other (See Comments)   Double vision Tolerates MacroBID   Morphine Other (See Comments)   Reaction not recalled   Simvastatin Other (See Comments)   Muscle aches   Sulfa Antibiotics Other (See Comments)   "Allergic," per [facility]   Vesicare [solifenacin] Other (See Comments)   Reaction not recalled by the patient- gets a lot of UTI(s)   Clindamycin/lincomycin Other (See Comments)   Upsets the stomach   Doxycycline Diarrhea   Food Other (See Comments)   No spicy foods or black pepper, raw onions - causes gerd   Ibuprofen Hives, Rash   Was told by a MD to "never take this"   Lincomycin Hcl Nausea Only, Other (See Comments)   Upsets the stomach   Penicillins Rash   Has patient had a PCN reaction causing immediate rash, facial/tongue/throat swelling, SOB or lightheadedness with hypotension: Yes Has patient had a PCN reaction causing severe rash involving mucus membranes or skin necrosis: No Has patient had a PCN reaction that required hospitalization No Has patient had a PCN reaction occurring within the last 10 years: No If all of the above answers are "NO", then may proceed with Cephalosporin use.   Toviaz [fesoterodine Fumarate Er] Other (See Comments)   Reaction not recalled   Voltaren [diclofenac Sodium] Rash      Medication List    TAKE these medications   acetaminophen 650 MG CR tablet Commonly known as: TYLENOL Take 1,300 mg by mouth 2 (two) times daily.   aspirin EC 81 MG tablet Take 1 tablet (81 mg total) by mouth daily.   Baclofen 5 MG Tabs Take 5 mg by mouth 3 (three) times daily as needed  for muscle spasms.   Calcium 600+D3 600-200 MG-UNIT Tabs Generic drug: Calcium Carb-Cholecalciferol Take 1 tablet by mouth 2 (two) times daily.   carvedilol 3.125 MG tablet Commonly known as: Coreg Take 1 tablet (3.125 mg total) by mouth 2 (two) times daily with a meal.   Centrum Silver 50+Women Tabs Take 1 tablet by mouth daily.   cholestyramine 4 g packet Commonly known as: QUESTRAN Take 4 g by mouth as directed. 1/2 pack every 2 days   cycloSPORINE 0.05 % ophthalmic emulsion Commonly known as: RESTASIS Place 1 drop into both eyes 2 (two) times daily.   D-Mannose Powd Take 1 Scoop by mouth daily.   docusate sodium 100 MG capsule Commonly known as: COLACE Take 100 mg by mouth See admin instructions. Take 100 mg by mouth once a day and an additional 100 mg as needed for constipation   Ellura 200 MG Caps Generic drug: Cranberry Take 200 mg by mouth daily.   famotidine 20 MG tablet Commonly known as: PEPCID Take 1 tablet (20 mg total) by mouth 2 (two) times daily.   furosemide 20 MG  tablet Commonly known as: LASIX Take 1 tablet (20 mg total) by mouth daily.   gabapentin 100 MG capsule Commonly known as: NEURONTIN Take 100 mg by mouth 2 (two) times daily.   Gaviscon Extra Strength 508-475 MG/10ML Susp Generic drug: Alum Hydroxide-Mag Carbonate Take 15 mLs by mouth 4 (four) times daily as needed (for reflux).   hydrALAZINE 10 MG tablet Commonly known as: APRESOLINE Take 1 tablet (10 mg total) by mouth daily at 3 pm. What changed:   when to take this  additional instructions   isosorbide mononitrate 60 MG 24 hr tablet Commonly known as: IMDUR Take 60 mg by mouth daily.   lidocaine 5 % Commonly known as: LIDODERM Place 1 patch onto the skin daily. Remove & Discard patch within 12 hours or as directed by MD   nitroGLYCERIN 0.4 MG SL tablet Commonly known as: NITROSTAT Place 1 tablet (0.4 mg total) under the tongue every 5 (five) minutes as needed for chest  pain. Reported on 11/12/2015   Nu-Iron 150 MG capsule Generic drug: iron polysaccharides Take 150 mg by mouth 2 (two) times daily.   polyethylene glycol 17 g packet Commonly known as: MIRALAX / GLYCOLAX Take 17 g by mouth 2 (two) times daily. What changed: when to take this   prednisoLONE acetate 1 % ophthalmic suspension Commonly known as: PRED FORTE Place 1 drop into both eyes 2 (two) times daily as needed (irritation).   psyllium 95 % Pack Commonly known as: HYDROCIL/METAMUCIL Take 1 packet by mouth 2 (two) times daily as needed for mild constipation or moderate constipation.   sertraline 50 MG tablet Commonly known as: ZOLOFT Take 50 mg by mouth every evening.   sertraline 25 MG tablet Commonly known as: ZOLOFT Take 25 mg by mouth daily.   Soothe XP Soln Place 1 drop into both eyes 2 (two) times daily.   Systane Balance 0.6 % Soln Generic drug: Propylene Glycol Place 1 drop into both eyes 4 (four) times daily as needed (irritation).   traMADol 50 MG tablet Commonly known as: ULTRAM Take 50 mg by mouth every 8 (eight) hours as needed for moderate pain.      Allergies  Allergen Reactions  . Butazolidin [Phenylbutazone] Swelling and Rash  . Cephalosporins Other (See Comments)    Site of swelling not recalled  . Levaquin [Levofloxacin] Shortness Of Breath  . Morphine And Related Other (See Comments)    Hallucinations   . Trimethoprim Nausea Only  . Aleve [Naproxen] Other (See Comments)    Was told by a MD to "never take this"  . Buprenorphine Other (See Comments)    "Allergic," per MAR  . Cilostazol Other (See Comments)    Stomach upset  . Codeine Nausea And Vomiting    Tolerates Tramadol  . Dexilant [Dexlansoprazole] Diarrhea  . Diclofenac Other (See Comments)    Documented on MAR  . Erythromycin Nausea And Vomiting    All "mycin" drugs  . Lactose Intolerance (Gi) Diarrhea  . Macrodantin Other (See Comments)    Double vision Tolerates MacroBID  .  Morphine Other (See Comments)    Reaction not recalled  . Simvastatin Other (See Comments)    Muscle aches   . Sulfa Antibiotics Other (See Comments)    "Allergic," per MAR  . Vesicare [Solifenacin] Other (See Comments)    Reaction not recalled by the patient- gets a lot of UTI(s)  . Clindamycin/Lincomycin Other (See Comments)    Upsets the stomach  . Doxycycline Diarrhea  .  Food Other (See Comments)    No spicy foods or black pepper, raw onions - causes gerd  . Ibuprofen Hives and Rash    Was told by a MD to "never take this"  . Lincomycin Hcl Nausea Only and Other (See Comments)    Upsets the stomach  . Penicillins Rash    Has patient had a PCN reaction causing immediate rash, facial/tongue/throat swelling, SOB or lightheadedness with hypotension: Yes Has patient had a PCN reaction causing severe rash involving mucus membranes or skin necrosis: No Has patient had a PCN reaction that required hospitalization No Has patient had a PCN reaction occurring within the last 10 years: No If all of the above answers are "NO", then may proceed with Cephalosporin use.   Lisbeth Ply [Fesoterodine Fumarate Er] Other (See Comments)    Reaction not recalled  . Voltaren [Diclofenac Sodium] Rash      The results of significant diagnostics from this hospitalization (including imaging, microbiology, ancillary and laboratory) are listed below for reference.    Significant Diagnostic Studies: DG Chest 2 View  Result Date: 05/04/2019 CLINICAL DATA:  85 year old female status post fall. EXAM: CHEST - 2 VIEW COMPARISON:  Chest radiographs 04/28/2018 and earlier. FINDINGS: AP supine and supine lateral views of the chest. Stable mediastinal contours. Prior CABG. Visualized tracheal air column is within normal limits. Lower lung volumes. No pneumothorax, pulmonary edema, pleural effusion or acute pulmonary opacity. Calcified aortic atherosclerosis. Osteopenia. Stable visualized osseous structures. Paucity  of bowel gas in the upper abdomen. IMPRESSION: 1. No acute cardiopulmonary abnormality or acute traumatic injury identified. 2. Aortic Atherosclerosis (ICD10-I70.0). Electronically Signed   By: Genevie Ann M.D.   On: 05/04/2019 04:57   CT Head Wo Contrast  Result Date: 05/18/2019 CLINICAL DATA:  Altered level of consciousness EXAM: CT HEAD WITHOUT CONTRAST TECHNIQUE: Contiguous axial images were obtained from the base of the skull through the vertex without intravenous contrast. COMPARISON:  CT Oct 28, 2015 FINDINGS: Brain: Stable region of gliosis/encephalomalacia in the right frontal lobe No evidence of acute infarction, hemorrhage, hydrocephalus, extra-axial collection or mass lesion/mass effect. Symmetric prominence of the ventricles, cisterns and sulci compatible with parenchymal volume loss. Patchy areas of white matter hypoattenuation are most compatible with chronic microvascular angiopathy. Vascular: Atherosclerotic calcification of the carotid siphons and intradural vertebral arteries. No hyperdense vessel. Skull: No calvarial fracture or suspicious osseous lesion. No scalp swelling or hematoma. Sinuses/Orbits: Paranasal sinuses and mastoid air cells are predominantly clear. Orbital structures are unremarkable aside from prior lens extractions. Other: None IMPRESSION: 1. No acute intracranial abnormality. 2. Stable region of gliosis/encephalomalacia in the right frontal lobe. 3. Stable parenchymal volume loss and chronic microvascular angiopathy. Electronically Signed   By: Lovena Le M.D.   On: 05/18/2019 20:14   MR Lumbar Spine w/o contrast  Result Date: 05/17/2019 CLINICAL DATA:  Fall, evaluate for L3 compression fracture EXAM: MRI LUMBAR SPINE WITHOUT CONTRAST TECHNIQUE: Multiplanar, multisequence MR imaging of the lumbar spine was performed. No intravenous contrast was administered. COMPARISON:  06/30/2018 FINDINGS: Motion artifact is present. Segmentation:  Standard. Alignment: Mild sigmoid  curvature. There is grade 1 anterolisthesis at L5-S1. Vertebrae: Vertebral body heights are maintained apart from mild degenerative endplate irregularity. No significant marrow edema or suspicious osseous lesion. There is bilateral sacral marrow edema. Conus medullaris and cauda equina: Conus extends to the L1 level. Conus and cauda equina appear normal. Paraspinal and other soft tissues: Interval atrophy of the left psoas muscle. Disc levels: L1-L2: Disc bulge with  endplate osteophytic ridging eccentric to the right. Similar minor canal stenosis. Increased mild to moderate right foraminal stenosis. Similar minor left foraminal stenosis. L2-L3: Disc bulge with endplate osteophytic ridging and facet arthropathy with ligamentum flavum infolding. Mild canal stenosis is similar. Similar mild foraminal stenosis. L3-L4: Disc bulge with endplate osteophytic ridging and facet arthropathy with ligamentum flavum infolding. Similar mild canal stenosis. Similar mild foraminal stenosis. L4-L5: Disc bulge with endplate osteophytic ridging and facet arthropathy with ligamentum flavum infolding. Similar marked canal stenosis with narrowing of the lateral recesses. Similar mild to moderate foraminal stenosis. L5-S1: Anterolisthesis with disc uncovering and facet arthropathy with ligamentum flavum infolding similar mild canal stenosis. Similar mild foraminal stenosis. IMPRESSION: No evidence of recent compression fracture. There is partially imaged bilateral sacral marrow edema, right greater than left, which could reflect traumatic injury. Multilevel degenerative changes as detailed above with suboptimal stenosis evaluation due to motion artifact. Canal stenosis remains greatest at L4-L5. Electronically Signed   By: Macy Mis M.D.   On: 05/17/2019 17:22   DG HIPS BILAT WITH PELVIS 3-4 VIEWS  Result Date: 05/18/2019 CLINICAL DATA:  BILATERAL hip pain after falling EXAM: DG HIP (WITH OR WITHOUT PELVIS) 3-4V BILAT COMPARISON:   05/04/2019 Correlation: MR LEFT hip 06/30/2018, LEFT hip radiographs 06/30/2018 FINDINGS: Osseous demineralization. Hip and SI joint spaces preserved. Soft tissue calcification medial to the proximal LEFT femur new since 06/30/2018 radiographs likely represents ossification in the torn distal LEFT iliopsoas tendon which was noted on prior MR. No acute fracture, dislocation, or bone destruction. IMPRESSION: Osseous demineralization without acute abnormalities. Ossification at previously torn distal LEFT iliopsoas tendon. Electronically Signed   By: Lavonia Dana M.D.   On: 05/18/2019 18:43   DG Hips Bilat W or Wo Pelvis 3-4 Views  Result Date: 05/04/2019 CLINICAL DATA:  85 year old female status post fall. EXAM: DG HIP (WITH OR WITHOUT PELVIS) 3-4V BILAT COMPARISON:  Left hip series 06/30/2018. Left hip MRI 06/30/2018. FINDINGS: New, elongated 6-7 centimeter confluent soft tissue calcification along the medial proximal left femur is felt to be myositis ossificans related to the injury of the iliopsoas muscle insertion demonstrated by MRI in January. The femoral heads remain normally located. Hip joint spaces appear stable. No superimposed proximal left femur fracture identified. Elsewhere the pelvis appears stable and intact. Chronic left inguinal surgical clips. Proximal right femur appears intact. Negative visible bowel gas pattern. SI joints remain normal. IMPRESSION: 1. 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 2. No superimposed acute fracture or dislocation identified about the bilateral hips or pelvis. If occult hip fracture is suspected or if the patient is unable to weightbear, MRI is the preferred modality for further evaluation. Electronically Signed   By: Genevie Ann M.D.   On: 05/04/2019 04:55    Microbiology: Recent Results (from the past 240 hour(s))  SARS CORONAVIRUS 2 (TAT 6-24 HRS)  Nasopharyngeal Nasopharyngeal Swab     Status: None   Collection Time: 05/18/19  2:10 PM   Specimen: Nasopharyngeal Swab  Result Value Ref Range Status   SARS Coronavirus 2 NEGATIVE NEGATIVE Final    Comment: (NOTE) SARS-CoV-2 target nucleic acids are NOT DETECTED. The SARS-CoV-2 RNA is generally detectable in upper and lower respiratory specimens during the acute phase of infection. Negative results do not preclude SARS-CoV-2 infection, do not rule out co-infections with other pathogens, and should not be used as the sole basis for treatment or other patient  management decisions. Negative results must be combined with clinical observations, patient history, and epidemiological information. The expected result is Negative. Fact Sheet for Patients: SugarRoll.be Fact Sheet for Healthcare Providers: https://www.woods-mathews.com/ This test is not yet approved or cleared by the [place] FDA and  has been authorized for detection and/or diagnosis of SARS-CoV-2 by FDA under an Emergency Use Authorization (EUA). This EUA will remain  in effect (meaning this test can be used) for the duration of the COVID-19 declaration under Section 56 4(b)(1) of the Act, 21 U.S.C. section 360bbb-3(b)(1), unless the authorization is terminated or revoked sooner. Performed at [facility], [place] [address], [place], [place] [address]   Wet prep, genital     Status: Abnormal   Collection Time: 05/18/19  3:27 PM  Result Value Ref Range Status   Yeast Wet Prep HPF POC NONE SEEN NONE SEEN Final   Trich, Wet Prep NONE SEEN NONE SEEN Final   Clue Cells Wet Prep HPF POC NONE SEEN NONE SEEN Final   WBC, Wet Prep HPF POC MODERATE (A) NONE SEEN Final   Sperm NONE SEEN  Final    Comment: Performed at [facility], [place] [address], [place], [place] [address]     Labs: Basic Metabolic Panel: Recent Labs  Lab 05/18/19 1029 05/19/19 0246  NA 132* 135  K  3.3* 3.1*  CL 94* 100  CO2 25 26  GLUCOSE 98 93  BUN 14 12  CREATININE 0.55 0.55  CALCIUM 9.3 8.6*   Liver Function Tests: No results for input(s): AST, ALT, ALKPHOS, BILITOT, PROT, ALBUMIN in the last 168 hours. No results for input(s): LIPASE, AMYLASE in the last 168 hours. No results for input(s): AMMONIA in the last 168 hours. CBC: Recent Labs  Lab 05/18/19 1029 05/19/19 0246  WBC 4.9 4.2  NEUTROABS 3.5  --   HGB 11.2* 9.4*  HCT 33.6* 27.3*  MCV 99.7 97.2  PLT 442* 445*   Cardiac Enzymes: No results for input(s): CKTOTAL, CKMB, CKMBINDEX, TROPONINI in the last 168 hours. BNP: BNP (last 3 results) No results for input(s): BNP in the last 8760 hours.  ProBNP (last 3 results) No results for input(s): PROBNP in the last 8760 hours.  CBG: No results for input(s): GLUCAP in the last 168 hours.     Signed:  Domenic Polite MD.  [facility] 05/19/2019, 11:41 AM

## 2019-05-19 NOTE — Progress Notes (Signed)
PT Cancellation Note  Patient Details Name: [name] MRN: [id number] DOB: February 17, 1932   Cancelled Treatment:    Reason Eval/Treat Not Completed: Other (comment) attempted to work with patient, she was occupied with nursing staff getting onto bedpan. Will attempt to return if time/schedule allow.    Windell Norfolk, DPT, PN1   Supplemental Physical Therapist [facility]    Pager [phone] Acute Rehab Office [phone]

## 2019-05-19 NOTE — Progress Notes (Addendum)
NCM received call from [facility] [employer] providing authorization for SNF and non emergence ambulance transportation.  Auth. # [id number] approved for 7 days, nonemergency ambulance authorization #  [id number]. NCM made daughter, MD and  [facility] admission liaison aware. Bed not ready, liaison will contact NCM in am. Whitman Hero RN,BSN,CM

## 2019-05-20 DIAGNOSIS — F0390 Unspecified dementia without behavioral disturbance: Secondary | ICD-10-CM | POA: Diagnosis not present

## 2019-05-20 DIAGNOSIS — L603 Nail dystrophy: Secondary | ICD-10-CM | POA: Diagnosis not present

## 2019-05-20 DIAGNOSIS — C9000 Multiple myeloma not having achieved remission: Secondary | ICD-10-CM | POA: Diagnosis not present

## 2019-05-20 DIAGNOSIS — R410 Disorientation, unspecified: Secondary | ICD-10-CM | POA: Diagnosis not present

## 2019-05-20 DIAGNOSIS — N3000 Acute cystitis without hematuria: Secondary | ICD-10-CM | POA: Diagnosis not present

## 2019-05-20 DIAGNOSIS — L600 Ingrowing nail: Secondary | ICD-10-CM | POA: Diagnosis not present

## 2019-05-20 DIAGNOSIS — N390 Urinary tract infection, site not specified: Secondary | ICD-10-CM | POA: Diagnosis not present

## 2019-05-20 DIAGNOSIS — I959 Hypotension, unspecified: Secondary | ICD-10-CM | POA: Diagnosis not present

## 2019-05-20 DIAGNOSIS — R2681 Unsteadiness on feet: Secondary | ICD-10-CM | POA: Diagnosis not present

## 2019-05-20 DIAGNOSIS — M2550 Pain in unspecified joint: Secondary | ICD-10-CM | POA: Diagnosis not present

## 2019-05-20 DIAGNOSIS — M79674 Pain in right toe(s): Secondary | ICD-10-CM | POA: Diagnosis not present

## 2019-05-20 DIAGNOSIS — N3281 Overactive bladder: Secondary | ICD-10-CM | POA: Diagnosis not present

## 2019-05-20 DIAGNOSIS — E440 Moderate protein-calorie malnutrition: Secondary | ICD-10-CM | POA: Diagnosis not present

## 2019-05-20 DIAGNOSIS — W19XXXS Unspecified fall, sequela: Secondary | ICD-10-CM | POA: Diagnosis not present

## 2019-05-20 DIAGNOSIS — I2510 Atherosclerotic heart disease of native coronary artery without angina pectoris: Secondary | ICD-10-CM | POA: Diagnosis not present

## 2019-05-20 DIAGNOSIS — M6281 Muscle weakness (generalized): Secondary | ICD-10-CM | POA: Diagnosis not present

## 2019-05-20 DIAGNOSIS — R531 Weakness: Secondary | ICD-10-CM | POA: Diagnosis not present

## 2019-05-20 DIAGNOSIS — M79675 Pain in left toe(s): Secondary | ICD-10-CM | POA: Diagnosis not present

## 2019-05-20 DIAGNOSIS — Z7401 Bed confinement status: Secondary | ICD-10-CM | POA: Diagnosis not present

## 2019-05-20 DIAGNOSIS — R4182 Altered mental status, unspecified: Secondary | ICD-10-CM | POA: Diagnosis not present

## 2019-05-20 DIAGNOSIS — Z743 Need for continuous supervision: Secondary | ICD-10-CM | POA: Diagnosis not present

## 2019-05-20 DIAGNOSIS — I10 Essential (primary) hypertension: Secondary | ICD-10-CM | POA: Diagnosis not present

## 2019-05-20 DIAGNOSIS — M069 Rheumatoid arthritis, unspecified: Secondary | ICD-10-CM | POA: Diagnosis not present

## 2019-05-20 DIAGNOSIS — K55059 Acute (reversible) ischemia of intestine, part and extent unspecified: Secondary | ICD-10-CM | POA: Diagnosis not present

## 2019-05-20 NOTE — Progress Notes (Signed)
OT Cancellation Note  Patient Details Name: RANAYA KNOBLAUCH MRN: [id number] DOB: [birth year]   Cancelled Treatment:    Reason Eval/Treat Not Completed: OT screened, no needs identified, will sign off.  Pt is for Discharge to SNF today, and authorization already obtained - OT eval deferred to SNF.    Lucille Passy, OTR/L Acute Rehabilitation Services Pager [phone] Office [phone]   Lucille Passy M 05/20/2019, 9:45 AM

## 2019-05-20 NOTE — TOC Transition Note (Signed)
Transition of Care [facility]) - CM/SW Discharge Note   Patient Details  Name: ALEXIE MANGRUM MRN: [id number] Date of Birth: August 23, 1931  [facility] CM/SW Contact:  Sharin Mons, RN Phone Number: 05/20/2019, 9:31 AM   Clinical Narrative:    Patient will DC to: [facility]  SNF Anticipated DC date:05/20/2019 Family notified: Molli Barrows by: Corey Harold   Per MD patient ready for DC today to [facility] SNF . RN, patient, patient's family, and facility notified of DC. Discharge Summary and FL2 sent to facility. RN to call report prior to discharge [phone], [phone]). Pt's room , [facility] Rm# [id number]. DC packet on chart. Ambulance transport requested for patient.   RNCM will sign off for now as intervention is no longer needed. Please consult [name] again if new needs arise.    Final next level of care: Skilled Nursing Facility Barriers to Discharge: No Barriers Identified   Patient Goals and CMS Choice        Discharge Placement                       Discharge Plan and Services                                     Social Determinants of Health (SDOH) Interventions     Readmission Risk Interventions No flowsheet data found.

## 2019-05-20 NOTE — Progress Notes (Addendum)
Nsg Discharge Note  Patient discharged to [facility]. Writer has attempted report X2. Voicemail left to a [employer]. Patient on their way via [facility]. VSS.  Received call back from facility and gave report to [place].  Admit Date:  05/18/2019 Discharge date: 05/20/2019   AIA HEDGES to be D/C'd Nursing Home per MD order.  AVS completed.  Copy for chart, and copy for patient signed, and dated. Patient/caregiver able to verbalize understanding.  Discharge Medication: Allergies as of 05/20/2019      Reactions   Butazolidin [phenylbutazone] Swelling, Rash   Cephalosporins Other (See Comments)   Site of swelling not recalled   Levaquin [levofloxacin] Shortness Of Breath   Morphine And Related Other (See Comments)   Hallucinations   Trimethoprim Nausea Only   Aleve [naproxen] Other (See Comments)   Was told by a MD to "never take this"   Buprenorphine Other (See Comments)   "Allergic," per [facility]   Cilostazol Other (See Comments)   Stomach upset   Codeine Nausea And Vomiting   Tolerates Tramadol   Dexilant [dexlansoprazole] Diarrhea   Diclofenac Other (See Comments)   Documented on MAR   Erythromycin Nausea And Vomiting   All "mycin" drugs   Lactose Intolerance (gi) Diarrhea   Macrodantin Other (See Comments)   Double vision Tolerates MacroBID   Morphine Other (See Comments)   Reaction not recalled   Simvastatin Other (See Comments)   Muscle aches   Sulfa Antibiotics Other (See Comments)   "Allergic," per [facility]   Vesicare [solifenacin] Other (See Comments)   Reaction not recalled by the patient- gets a lot of UTI(s)   Clindamycin/lincomycin Other (See Comments)   Upsets the stomach   Doxycycline Diarrhea   Food Other (See Comments)   No spicy foods or black pepper, raw onions - causes gerd   Ibuprofen Hives, Rash   Was told by a MD to "never take this"   Lincomycin Hcl Nausea Only, Other (See Comments)   Upsets the stomach   Penicillins Rash   Has patient had a  PCN reaction causing immediate rash, facial/tongue/throat swelling, SOB or lightheadedness with hypotension: Yes Has patient had a PCN reaction causing severe rash involving mucus membranes or skin necrosis: No Has patient had a PCN reaction that required hospitalization No Has patient had a PCN reaction occurring within the last 10 years: No If all of the above answers are "NO", then may proceed with Cephalosporin use.   Toviaz [fesoterodine Fumarate Er] Other (See Comments)   Reaction not recalled   Voltaren [diclofenac Sodium] Rash      Medication List    TAKE these medications   acetaminophen 650 MG CR tablet Commonly known as: TYLENOL Take 1,300 mg by mouth 2 (two) times daily.   aspirin EC 81 MG tablet Take 1 tablet (81 mg total) by mouth daily.   Baclofen 5 MG Tabs Take 5 mg by mouth 3 (three) times daily as needed for muscle spasms.   Calcium 600+D3 600-200 MG-UNIT Tabs Generic drug: Calcium Carb-Cholecalciferol Take 1 tablet by mouth 2 (two) times daily.   carvedilol 3.125 MG tablet Commonly known as: Coreg Take 1 tablet (3.125 mg total) by mouth 2 (two) times daily with a meal.   Centrum Silver 50+Women Tabs Take 1 tablet by mouth daily.   cholestyramine 4 g packet Commonly known as: QUESTRAN Take 4 g by mouth as directed. 1/2 pack every 2 days   cycloSPORINE 0.05 % ophthalmic emulsion Commonly known as:  RESTASIS Place 1 drop into both eyes 2 (two) times daily.   D-Mannose Powd Take 1 Scoop by mouth daily.   docusate sodium 100 MG capsule Commonly known as: COLACE Take 100 mg by mouth See admin instructions. Take 100 mg by mouth once a day and an additional 100 mg as needed for constipation   Ellura 200 MG Caps Generic drug: Cranberry Take 200 mg by mouth daily.   famotidine 20 MG tablet Commonly known as: PEPCID Take 1 tablet (20 mg total) by mouth 2 (two) times daily.   furosemide 20 MG tablet Commonly known as: LASIX Take 1 tablet (20 mg total)  by mouth daily.   gabapentin 100 MG capsule Commonly known as: NEURONTIN Take 100 mg by mouth 2 (two) times daily.   Gaviscon Extra Strength 508-475 MG/10ML Susp Generic drug: Alum Hydroxide-Mag Carbonate Take 15 mLs by mouth 4 (four) times daily as needed (for reflux).   hydrALAZINE 10 MG tablet Commonly known as: APRESOLINE Take 1 tablet (10 mg total) by mouth daily at 3 pm. What changed:   when to take this  additional instructions   isosorbide mononitrate 60 MG 24 hr tablet Commonly known as: IMDUR Take 60 mg by mouth daily.   lidocaine 5 % Commonly known as: LIDODERM Place 1 patch onto the skin daily. Remove & Discard patch within 12 hours or as directed by MD   nitroGLYCERIN 0.4 MG SL tablet Commonly known as: NITROSTAT Place 1 tablet (0.4 mg total) under the tongue every 5 (five) minutes as needed for chest pain. Reported on 11/12/2015   Nu-Iron 150 MG capsule Generic drug: iron polysaccharides Take 150 mg by mouth 2 (two) times daily.   polyethylene glycol 17 g packet Commonly known as: MIRALAX / GLYCOLAX Take 17 g by mouth 2 (two) times daily. What changed: when to take this   prednisoLONE acetate 1 % ophthalmic suspension Commonly known as: PRED FORTE Place 1 drop into both eyes 2 (two) times daily as needed (irritation).   psyllium 95 % Pack Commonly known as: HYDROCIL/METAMUCIL Take 1 packet by mouth 2 (two) times daily as needed for mild constipation or moderate constipation.   sertraline 50 MG tablet Commonly known as: ZOLOFT Take 50 mg by mouth every evening.   sertraline 25 MG tablet Commonly known as: ZOLOFT Take 25 mg by mouth daily.   Soothe XP Soln Place 1 drop into both eyes 2 (two) times daily.   Systane Balance 0.6 % Soln Generic drug: Propylene Glycol Place 1 drop into both eyes 4 (four) times daily as needed (irritation).   traMADol 50 MG tablet Commonly known as: ULTRAM Take 50 mg by mouth every 8 (eight) hours as needed for  moderate pain.       Discharge Assessment: Vitals:   05/19/19 2300 05/20/19 1121  BP: (!) 117/50 (!) 138/56  Pulse: 78 79  Resp: 16 18  Temp: 98.9 F (37.2 C)   SpO2: 95% 95%   Skin clean, dry and intact without evidence of skin break down, no evidence of skin tears noted. IV catheter discontinued intact. Site without signs and symptoms of complications - no redness or edema noted at insertion site, patient denies c/o pain - only slight tenderness at site.  Dressing with slight pressure applied.  D/c Instructions-Education: Discharge instructions given to patient/family with verbalized understanding. D/c education completed with patient/family including follow up instructions, medication list, d/c activities limitations if indicated, with other d/c instructions as indicated by MD - patient  able to verbalize understanding, all questions fully answered. Patient instructed to return to ED, call 911, or call MD for any changes in condition.  Patient escorted via [place], and D/C home via private auto.  Erasmo Leventhal, RN 05/20/2019 11:34 AM

## 2019-05-21 ENCOUNTER — Other Ambulatory Visit (HOSPITAL_BASED_OUTPATIENT_CLINIC_OR_DEPARTMENT_OTHER): Payer: PPO

## 2019-05-23 DIAGNOSIS — N390 Urinary tract infection, site not specified: Secondary | ICD-10-CM | POA: Diagnosis not present

## 2019-05-23 DIAGNOSIS — K55059 Acute (reversible) ischemia of intestine, part and extent unspecified: Secondary | ICD-10-CM | POA: Diagnosis not present

## 2019-05-23 DIAGNOSIS — I10 Essential (primary) hypertension: Secondary | ICD-10-CM | POA: Diagnosis not present

## 2019-05-23 DIAGNOSIS — N3000 Acute cystitis without hematuria: Secondary | ICD-10-CM | POA: Diagnosis not present

## 2019-05-23 DIAGNOSIS — N3281 Overactive bladder: Secondary | ICD-10-CM | POA: Diagnosis not present

## 2019-05-24 ENCOUNTER — Other Ambulatory Visit: Payer: Self-pay

## 2019-05-24 ENCOUNTER — Ambulatory Visit (INDEPENDENT_AMBULATORY_CARE_PROVIDER_SITE_OTHER): Payer: PPO | Admitting: Sports Medicine

## 2019-05-24 ENCOUNTER — Encounter: Payer: Self-pay | Admitting: Sports Medicine

## 2019-05-24 ENCOUNTER — Encounter

## 2019-05-24 DIAGNOSIS — M79674 Pain in right toe(s): Secondary | ICD-10-CM

## 2019-05-24 DIAGNOSIS — M79675 Pain in left toe(s): Secondary | ICD-10-CM | POA: Diagnosis not present

## 2019-05-24 DIAGNOSIS — L600 Ingrowing nail: Secondary | ICD-10-CM

## 2019-05-24 DIAGNOSIS — L603 Nail dystrophy: Secondary | ICD-10-CM

## 2019-05-24 DIAGNOSIS — W19XXXS Unspecified fall, sequela: Secondary | ICD-10-CM | POA: Diagnosis not present

## 2019-05-24 DIAGNOSIS — N3000 Acute cystitis without hematuria: Secondary | ICD-10-CM | POA: Diagnosis not present

## 2019-05-24 DIAGNOSIS — I2510 Atherosclerotic heart disease of native coronary artery without angina pectoris: Secondary | ICD-10-CM | POA: Diagnosis not present

## 2019-05-24 DIAGNOSIS — R531 Weakness: Secondary | ICD-10-CM | POA: Diagnosis not present

## 2019-05-24 DIAGNOSIS — M069 Rheumatoid arthritis, unspecified: Secondary | ICD-10-CM | POA: Diagnosis not present

## 2019-05-24 DIAGNOSIS — I739 Peripheral vascular disease, unspecified: Secondary | ICD-10-CM

## 2019-05-24 NOTE — Progress Notes (Signed)
Subjective: [name] is a 85 y.o. female patient seen today in office with complaint of mildly painful thickened and elongated toenails especially at 1st and 2nd toes unable to trim. Patient states that her ingrown nails are doing better but some soreness.  No other issues at this time.  Patient Active Problem List   Diagnosis Date Noted  . AMS (altered mental status) 05/18/2019  . Recurrent urinary tract infection 05/18/2019  . Left hip pain 06/30/2018  . Disc degeneration, lumbar 06/23/2018  . Left thyroid nodule 08/29/2017  . Chronic idiopathic constipation 08/18/2017  . Abdominal pain 07/31/2017  . Mesenteric ischemia ([place]) 05/04/2017  . Unsteadiness on feet   . Unstable angina pectoris ([place])   . Tinea pedis   . Spinal stenosis of lumbar region   . Somnolence   . Seasonal allergic rhinitis   . Restless legs syndrome   . Raynaud's syndrome   . Raynauds syndrome   . Radiculopathy   . Panic disorder   . Ovarian cyst, bilateral   . Osteopenia   . Myocardial infarction ([place])   . Muscle weakness (generalized)   . Mixed incontinence   . Mild chronic anemia   . Major depressive disorder   . Long term (current) use of aspirin   . Intermediate coronary syndrome ([place])   . Incontinent of urine   . IBS (irritable bowel syndrome)   . Hypercholesteremia   . Hx of CABG   . GERD (gastroesophageal reflux disease)   . Dementia ([place])   . Chest pain   . Atherosclerotic heart disease of native coronary artery without angina pectoris   . Arthritis   . Anxiety   . Allergic rhinitis   . Abnormal weight loss   . History of adenomatous polyp of colon 03/02/2017  . NSTEMI (non-ST elevated myocardial infarction) ([place]) 02/02/2017  . SOB (shortness of breath) 09/02/2016  . Dizziness 09/02/2016  . Non-ST elevation (NSTEMI) myocardial infarction ([place]) 11/26/2015  . Polymyalgia rheumatica ([place]) 11/26/2015  . Hypokalemia 11/26/2015  . Mild persistent asthma 11/12/2015  . Coronary  artery disease involving autologous artery coronary bypass graft with angina pectoris with documented spasm ([place]) 11/12/2015  . Multiple myeloma in remission ([place]) 11/12/2015  . Current use of beta blocker 11/12/2015  . Gastroesophageal reflux disease without esophagitis 11/12/2015  . Other allergic rhinitis 11/12/2015  . Weight loss, non-intentional 10/03/2015  . Asthma 09/24/2015  . Asthma in adult without complication 62/37/6283  . Seasonal allergic rhinitis due to pollen 09/24/2015  . Cervical radiculopathy 07/25/2015  . Chronic coronary artery disease 07/25/2015  . Coronary artery disease 07/25/2015  . Disturbance in sleep behavior 07/25/2015  . Elevated CK 07/25/2015  . Elevated creatine kinase level 07/25/2015  . Generalized osteoarthritis of hand 07/25/2015  . Other long term (current) drug therapy 07/25/2015  . Overactive bladder 07/25/2015  . Restless leg 07/25/2015  . Restless leg syndrome 07/25/2015  . Sleep disturbances 07/25/2015  . Idiopathic peripheral neuropathy 07/04/2015  . Lumbar radiculopathy, chronic 07/04/2015  . Mixed dementia ([place]) 07/04/2015  . Risk for falls 07/04/2015  . Sleep disturbance 07/04/2015  . Chest pain with moderate risk for cardiac etiology 06/13/2015  . Right sciatic nerve pain 10/19/2014  . Abnormal thyroid blood test 08/25/2014  . Chronic fatigue 08/09/2014  . Multiple myeloma ([place]) 07/21/2013  . Depression 01/14/2013  . Generalized anxiety disorder 01/14/2013  . Gastroesophageal reflux disease 01/13/2013  . Urine test positive for microalbuminuria 12/08/2011  . Cyst of ovary 12/17/2010  .  KNEE PAIN, BILATERAL 08/15/2010  . Abnormal gait 08/15/2010  . Atherosclerosis of coronary artery bypass graft 10/11/2009  . CHEST PAIN, NON-CARDIAC 10/11/2009  . HIP PAIN, RIGHT 08/13/2009  . GREATER TROCHANTERIC BURSITIS 08/13/2009  . LUMBOSACRAL SPONDYLOSIS WITHOUT MYELOPATHY 11/10/2008  . Osteoarthritis of lumbosacral spine without myelopathy  11/10/2008  . FOOT PAIN, BILATERAL 06/30/2008  . Hyperlipidemia 06/22/2008  . Hypertension 06/22/2008  . Raynaud's disease 06/22/2008  . BURSITIS, LEFT SHOULDER 06/22/2008    Current Outpatient Medications on File Prior to Visit  Medication Sig Dispense Refill  . acetaminophen (TYLENOL) 650 MG CR tablet Take 1,300 mg by mouth 2 (two) times daily.    . Alum Hydroxide-Mag Carbonate (GAVISCON EXTRA STRENGTH) 508-475 MG/10ML SUSP Take 15 mLs by mouth 4 (four) times daily as needed (for reflux).     . Artificial Tear Solution (SOOTHE XP) SOLN Place 1 drop into both eyes 2 (two) times daily.    Marland Kitchen aspirin EC 81 MG tablet Take 1 tablet (81 mg total) by mouth daily. 90 tablet 3  . baclofen 5 MG TABS Take 5 mg by mouth 3 (three) times daily as needed for muscle spasms. 30 each 0  . Calcium Carb-Cholecalciferol (CALCIUM 600+D3) 600-200 MG-UNIT TABS Take 1 tablet by mouth 2 (two) times daily.    . carvedilol (COREG) 3.125 MG tablet Take 1 tablet (3.125 mg total) by mouth 2 (two) times daily with a meal. 60 tablet 3  . cholestyramine (QUESTRAN) 4 g packet Take 4 g by mouth as directed. 1/2 pack every 2 days    . Cranberry (ELLURA) 200 MG CAPS Take 200 mg by mouth daily.    . cycloSPORINE (RESTASIS) 0.05 % ophthalmic emulsion Place 1 drop into both eyes 2 (two) times daily.     . D-Mannose POWD Take 1 Scoop by mouth daily.    Marland Kitchen docusate sodium (COLACE) 100 MG capsule Take 100 mg by mouth See admin instructions. Take 100 mg by mouth once a day and an additional 100 mg as needed for constipation    . famotidine (PEPCID) 20 MG tablet Take 1 tablet (20 mg total) by mouth 2 (two) times daily. 60 tablet 3  . furosemide (LASIX) 20 MG tablet Take 1 tablet (20 mg total) by mouth daily. 10 tablet 0  . gabapentin (NEURONTIN) 100 MG capsule Take 100 mg by mouth 2 (two) times daily.     . hydrALAZINE (APRESOLINE) 10 MG tablet Take 1 tablet (10 mg total) by mouth daily at 3 pm. (Patient taking differently: Take 10 mg  by mouth See admin instructions. Take 10 mg by mouth daily at 2:30 PM and an additional 10 mg two times a day as needed if Systolic reading is >544) 90 tablet 3  . iron polysaccharides (NU-IRON) 150 MG capsule Take 150 mg by mouth 2 (two) times daily.    . isosorbide mononitrate (IMDUR) 60 MG 24 hr tablet Take 60 mg by mouth daily.    Marland Kitchen lidocaine (LIDODERM) 5 % Place 1 patch onto the skin daily. Remove & Discard patch within 12 hours or as directed by MD 15 patch 0  . Multiple Vitamins-Minerals (CENTRUM SILVER 50+WOMEN) TABS Take 1 tablet by mouth daily.    . nitroGLYCERIN (NITROSTAT) 0.4 MG SL tablet Place 1 tablet (0.4 mg total) under the tongue every 5 (five) minutes as needed for chest pain. Reported on 11/12/2015 25 tablet 2  . polyethylene glycol (MIRALAX / GLYCOLAX) packet Take 17 g by mouth 2 (two) times daily. (  Patient taking differently: Take 17 g by mouth daily. ) 1 each 0  . prednisoLONE acetate (PRED FORTE) 1 % ophthalmic suspension Place 1 drop into both eyes 2 (two) times daily as needed (irritation).     . Propylene Glycol (SYSTANE BALANCE) 0.6 % SOLN Place 1 drop into both eyes 4 (four) times daily as needed (irritation).     . psyllium (HYDROCIL/METAMUCIL) 95 % PACK Take 1 packet by mouth 2 (two) times daily as needed for mild constipation or moderate constipation.    . sertraline (ZOLOFT) 25 MG tablet Take 25 mg by mouth daily.    . sertraline (ZOLOFT) 50 MG tablet Take 50 mg by mouth every evening.    . traMADol (ULTRAM) 50 MG tablet Take 50 mg by mouth every 8 (eight) hours as needed for moderate pain.      No current facility-administered medications on file prior to visit.    Allergies  Allergen Reactions  . Butazolidin [Phenylbutazone] Swelling and Rash  . Cephalosporins Other (See Comments)    Site of swelling not recalled  . Levaquin [Levofloxacin] Shortness Of Breath  . Morphine And Related Other (See Comments)    Hallucinations   . Trimethoprim Nausea Only  .  Aleve [Naproxen] Other (See Comments)    Was told by a MD to "never take this"  . Buprenorphine Other (See Comments)    "Allergic," per MAR  . Cilostazol Other (See Comments)    Stomach upset  . Codeine Nausea And Vomiting    Tolerates Tramadol  . Dexilant [Dexlansoprazole] Diarrhea  . Diclofenac Other (See Comments)    Documented on MAR  . Erythromycin Nausea And Vomiting    All "mycin" drugs  . Lactose Intolerance (Gi) Diarrhea  . Macrodantin Other (See Comments)    Double vision Tolerates MacroBID  . Morphine Other (See Comments)    Reaction not recalled  . Simvastatin Other (See Comments)    Muscle aches   . Sulfa Antibiotics Other (See Comments)    "Allergic," per MAR  . Vesicare [Solifenacin] Other (See Comments)    Reaction not recalled by the patient- gets a lot of UTI(s)  . Clindamycin/Lincomycin Other (See Comments)    Upsets the stomach  . Doxycycline Diarrhea  . Food Other (See Comments)    No spicy foods or black pepper, raw onions - causes gerd  . Ibuprofen Hives and Rash    Was told by a MD to "never take this"  . Lincomycin Hcl Nausea Only and Other (See Comments)    Upsets the stomach  . Penicillins Rash    Has patient had a PCN reaction causing immediate rash, facial/tongue/throat swelling, SOB or lightheadedness with hypotension: Yes Has patient had a PCN reaction causing severe rash involving mucus membranes or skin necrosis: No Has patient had a PCN reaction that required hospitalization No Has patient had a PCN reaction occurring within the last 10 years: No If all of the above answers are "NO", then may proceed with Cephalosporin use.   Lisbeth Ply [Fesoterodine Fumarate Er] Other (See Comments)    Reaction not recalled  . Voltaren [Diclofenac Sodium] Rash    Objective: Physical Exam  General: Well developed, nourished, no acute distress, awake, alert and oriented x 2  Vascular: Dorsalis pedis artery 1/4 bilateral, Posterior tibial artery 0/4  bilateral, skin temperature warm to warm proximal to distal bilateral lower extremities, mild varicosities, pedal hair present bilateral.  More increased swelling on the left greater than the right leg  chronic in nature.  Neurological: Gross sensation present via light touch bilateral.   Dermatological: Skin is warm, dry, and supple bilateral, Nails 1-10 are tender, long, thick, and discolored with mild subungal debris with no pain hallux nails at corners, left hallux medial nail margin well healed, no open lesions present bilateral, no callus/corns/hyperkeratotic tissue present bilateral. No signs of infection bilateral.  Musculoskeletal: Asymptomatic hallux malleous L>R boney deformities noted bilateral. Muscular strength 4/5 without painon range of motion.+ History of arthritis. No pain with calf compression bilateral.  Assessment and Plan:  Problem List Items Addressed This Visit    None    Visit Diagnoses    Ingrown nail    -  Primary   Toe pain, left       Toe pain, right       Nail dystrophy       PVD (peripheral vascular disease) (HCC)       Toe pain, bilateral         -Examined patient.  -Discussed treatment options for painful dystrophic nails as previous. -Mechanically debrided and reduced nails with sterile nail nipper and dremel nail file without incident -Dispensed toe caps for patient to use as instructed -Patient to return in 2-2.5 months for nail trim or sooner if symptoms worsen.  Landis Martins, DPM

## 2019-05-30 NOTE — Progress Notes (Signed)
Back looks reasonable.  Patient has sacral stress reaction.  That is the new finding.  She is okay to be weightbearing as tolerated but this is going to take 6 weeks to improve.

## 2019-05-31 ENCOUNTER — Telehealth: Payer: Self-pay

## 2019-05-31 NOTE — Telephone Encounter (Signed)
Patient's daughter [name] was returning a call.  Cb# [phone].  Please advise.  Thank you.

## 2019-05-31 NOTE — Telephone Encounter (Signed)
Tried calling her back, no answer, no VM to LM

## 2019-06-08 DIAGNOSIS — U071 COVID-19: Secondary | ICD-10-CM | POA: Diagnosis not present

## 2019-06-16 DIAGNOSIS — N390 Urinary tract infection, site not specified: Secondary | ICD-10-CM | POA: Diagnosis not present

## 2019-06-16 DIAGNOSIS — R350 Frequency of micturition: Secondary | ICD-10-CM | POA: Diagnosis not present

## 2019-06-24 DIAGNOSIS — R410 Disorientation, unspecified: Secondary | ICD-10-CM | POA: Diagnosis not present

## 2019-06-26 ENCOUNTER — Emergency Department (HOSPITAL_COMMUNITY): Payer: PPO

## 2019-06-26 ENCOUNTER — Observation Stay (HOSPITAL_COMMUNITY)
Admission: EM | Admit: 2019-06-26 | Discharge: 2019-06-27 | Disposition: A | Discharge status: Skilled Nursing Facility | Payer: PPO | Attending: Internal Medicine | Admitting: Internal Medicine

## 2019-06-26 ENCOUNTER — Other Ambulatory Visit: Payer: Self-pay

## 2019-06-26 DIAGNOSIS — Z886 Allergy status to analgesic agent status: Secondary | ICD-10-CM | POA: Insufficient documentation

## 2019-06-26 DIAGNOSIS — M8580 Other specified disorders of bone density and structure, unspecified site: Secondary | ICD-10-CM | POA: Insufficient documentation

## 2019-06-26 DIAGNOSIS — R140 Abdominal distension (gaseous): Secondary | ICD-10-CM

## 2019-06-26 DIAGNOSIS — K219 Gastro-esophageal reflux disease without esophagitis: Secondary | ICD-10-CM | POA: Insufficient documentation

## 2019-06-26 DIAGNOSIS — Z8249 Family history of ischemic heart disease and other diseases of the circulatory system: Secondary | ICD-10-CM | POA: Insufficient documentation

## 2019-06-26 DIAGNOSIS — K5900 Constipation, unspecified: Principal | ICD-10-CM | POA: Insufficient documentation

## 2019-06-26 DIAGNOSIS — N2889 Other specified disorders of kidney and ureter: Secondary | ICD-10-CM | POA: Diagnosis not present

## 2019-06-26 DIAGNOSIS — K5641 Fecal impaction: Secondary | ICD-10-CM | POA: Diagnosis present

## 2019-06-26 DIAGNOSIS — F411 Generalized anxiety disorder: Secondary | ICD-10-CM | POA: Diagnosis not present

## 2019-06-26 DIAGNOSIS — Z86010 Personal history of colonic polyps: Secondary | ICD-10-CM | POA: Insufficient documentation

## 2019-06-26 DIAGNOSIS — M48061 Spinal stenosis, lumbar region without neurogenic claudication: Secondary | ICD-10-CM | POA: Insufficient documentation

## 2019-06-26 DIAGNOSIS — G2581 Restless legs syndrome: Secondary | ICD-10-CM | POA: Diagnosis not present

## 2019-06-26 DIAGNOSIS — J4530 Mild persistent asthma, uncomplicated: Secondary | ICD-10-CM | POA: Insufficient documentation

## 2019-06-26 DIAGNOSIS — Z955 Presence of coronary angioplasty implant and graft: Secondary | ICD-10-CM | POA: Insufficient documentation

## 2019-06-26 DIAGNOSIS — E785 Hyperlipidemia, unspecified: Secondary | ICD-10-CM | POA: Diagnosis not present

## 2019-06-26 DIAGNOSIS — R109 Unspecified abdominal pain: Secondary | ICD-10-CM | POA: Insufficient documentation

## 2019-06-26 DIAGNOSIS — Z880 Allergy status to penicillin: Secondary | ICD-10-CM | POA: Insufficient documentation

## 2019-06-26 DIAGNOSIS — N83201 Unspecified ovarian cyst, right side: Secondary | ICD-10-CM | POA: Insufficient documentation

## 2019-06-26 DIAGNOSIS — Z9841 Cataract extraction status, right eye: Secondary | ICD-10-CM | POA: Insufficient documentation

## 2019-06-26 DIAGNOSIS — R001 Bradycardia, unspecified: Secondary | ICD-10-CM | POA: Diagnosis not present

## 2019-06-26 DIAGNOSIS — I7300 Raynaud's syndrome without gangrene: Secondary | ICD-10-CM | POA: Insufficient documentation

## 2019-06-26 DIAGNOSIS — Z7982 Long term (current) use of aspirin: Secondary | ICD-10-CM | POA: Insufficient documentation

## 2019-06-26 DIAGNOSIS — I252 Old myocardial infarction: Secondary | ICD-10-CM | POA: Insufficient documentation

## 2019-06-26 DIAGNOSIS — K3189 Other diseases of stomach and duodenum: Secondary | ICD-10-CM | POA: Insufficient documentation

## 2019-06-26 DIAGNOSIS — E876 Hypokalemia: Secondary | ICD-10-CM | POA: Insufficient documentation

## 2019-06-26 DIAGNOSIS — R2681 Unsteadiness on feet: Secondary | ICD-10-CM | POA: Insufficient documentation

## 2019-06-26 DIAGNOSIS — M25552 Pain in left hip: Secondary | ICD-10-CM | POA: Insufficient documentation

## 2019-06-26 DIAGNOSIS — Z8349 Family history of other endocrine, nutritional and metabolic diseases: Secondary | ICD-10-CM | POA: Insufficient documentation

## 2019-06-26 DIAGNOSIS — Z885 Allergy status to narcotic agent status: Secondary | ICD-10-CM | POA: Insufficient documentation

## 2019-06-26 DIAGNOSIS — R1084 Generalized abdominal pain: Secondary | ICD-10-CM | POA: Insufficient documentation

## 2019-06-26 DIAGNOSIS — R748 Abnormal levels of other serum enzymes: Secondary | ICD-10-CM | POA: Insufficient documentation

## 2019-06-26 DIAGNOSIS — G479 Sleep disorder, unspecified: Secondary | ICD-10-CM | POA: Diagnosis not present

## 2019-06-26 DIAGNOSIS — R0902 Hypoxemia: Secondary | ICD-10-CM | POA: Diagnosis not present

## 2019-06-26 DIAGNOSIS — Z803 Family history of malignant neoplasm of breast: Secondary | ICD-10-CM | POA: Insufficient documentation

## 2019-06-26 DIAGNOSIS — Z03818 Encounter for observation for suspected exposure to other biological agents ruled out: Secondary | ICD-10-CM | POA: Diagnosis not present

## 2019-06-26 DIAGNOSIS — N83202 Unspecified ovarian cyst, left side: Secondary | ICD-10-CM | POA: Insufficient documentation

## 2019-06-26 DIAGNOSIS — K589 Irritable bowel syndrome without diarrhea: Secondary | ICD-10-CM | POA: Diagnosis not present

## 2019-06-26 DIAGNOSIS — E7800 Pure hypercholesterolemia, unspecified: Secondary | ICD-10-CM | POA: Diagnosis not present

## 2019-06-26 DIAGNOSIS — I10 Essential (primary) hypertension: Secondary | ICD-10-CM | POA: Diagnosis not present

## 2019-06-26 DIAGNOSIS — Z8379 Family history of other diseases of the digestive system: Secondary | ICD-10-CM | POA: Insufficient documentation

## 2019-06-26 DIAGNOSIS — C9001 Multiple myeloma in remission: Secondary | ICD-10-CM | POA: Diagnosis not present

## 2019-06-26 DIAGNOSIS — I2510 Atherosclerotic heart disease of native coronary artery without angina pectoris: Secondary | ICD-10-CM | POA: Diagnosis not present

## 2019-06-26 DIAGNOSIS — F329 Major depressive disorder, single episode, unspecified: Secondary | ICD-10-CM | POA: Insufficient documentation

## 2019-06-26 DIAGNOSIS — Z20822 Contact with and (suspected) exposure to covid-19: Secondary | ICD-10-CM | POA: Insufficient documentation

## 2019-06-26 DIAGNOSIS — N3281 Overactive bladder: Secondary | ICD-10-CM | POA: Insufficient documentation

## 2019-06-26 DIAGNOSIS — F0390 Unspecified dementia without behavioral disturbance: Secondary | ICD-10-CM | POA: Diagnosis not present

## 2019-06-26 DIAGNOSIS — M353 Polymyalgia rheumatica: Secondary | ICD-10-CM | POA: Insufficient documentation

## 2019-06-26 DIAGNOSIS — Z4682 Encounter for fitting and adjustment of non-vascular catheter: Secondary | ICD-10-CM | POA: Diagnosis not present

## 2019-06-26 DIAGNOSIS — Z79899 Other long term (current) drug therapy: Secondary | ICD-10-CM | POA: Insufficient documentation

## 2019-06-26 DIAGNOSIS — Z881 Allergy status to other antibiotic agents status: Secondary | ICD-10-CM | POA: Insufficient documentation

## 2019-06-26 DIAGNOSIS — Z9842 Cataract extraction status, left eye: Secondary | ICD-10-CM | POA: Insufficient documentation

## 2019-06-26 DIAGNOSIS — Z882 Allergy status to sulfonamides status: Secondary | ICD-10-CM | POA: Insufficient documentation

## 2019-06-26 DIAGNOSIS — R42 Dizziness and giddiness: Secondary | ICD-10-CM | POA: Insufficient documentation

## 2019-06-26 DIAGNOSIS — D649 Anemia, unspecified: Secondary | ICD-10-CM | POA: Diagnosis not present

## 2019-06-26 LAB — URINALYSIS, ROUTINE W REFLEX MICROSCOPIC
Bilirubin Urine: NEGATIVE
Glucose, UA: NEGATIVE mg/dL
Hgb urine dipstick: NEGATIVE
Ketones, ur: NEGATIVE mg/dL
Leukocytes,Ua: NEGATIVE
Nitrite: NEGATIVE
Protein, ur: 30 mg/dL — AB
Specific Gravity, Urine: 1.01 (ref 1.005–1.030)
pH: 7 (ref 5.0–8.0)

## 2019-06-26 LAB — COMPREHENSIVE METABOLIC PANEL
ALT: 18 U/L (ref 0–44)
AST: 32 U/L (ref 15–41)
Albumin: 3 g/dL — ABNORMAL LOW (ref 3.5–5.0)
Alkaline Phosphatase: 99 U/L (ref 38–126)
Anion gap: 11 (ref 5–15)
BUN: 23 mg/dL (ref 8–23)
CO2: 28 mmol/L (ref 22–32)
Calcium: 9.4 mg/dL (ref 8.9–10.3)
Chloride: 96 mmol/L — ABNORMAL LOW (ref 98–111)
Creatinine, Ser: 0.74 mg/dL (ref 0.44–1.00)
GFR calc Af Amer: >60 mL/min (ref >60)
GFR calc non Af Amer: >60 mL/min (ref >60)
Glucose, Bld: 101 mg/dL — ABNORMAL HIGH (ref 70–99)
Potassium: 3.5 mmol/L (ref 3.5–5.1)
Sodium: 135 mmol/L (ref 135–145)
Total Bilirubin: 0.3 mg/dL (ref 0.3–1.2)
Total Protein: 8.2 g/dL — ABNORMAL HIGH (ref 6.5–8.1)

## 2019-06-26 LAB — CBC WITH DIFFERENTIAL/PLATELET
Abs Immature Granulocytes: 0.02 10*3/uL (ref 0.00–0.07)
Basophils Absolute: 0 10*3/uL (ref 0.0–0.1)
Basophils Relative: 0 %
Eosinophils Absolute: 0.1 10*3/uL (ref 0.0–0.5)
Eosinophils Relative: 2 %
HCT: 27.8 % — ABNORMAL LOW (ref 36.0–46.0)
Hemoglobin: 9.2 g/dL — ABNORMAL LOW (ref 12.0–15.0)
Immature Granulocytes: 0 %
Lymphocytes Relative: 17 %
Lymphs Abs: 0.9 10*3/uL (ref 0.7–4.0)
MCH: 32.9 pg (ref 26.0–34.0)
MCHC: 33.1 g/dL (ref 30.0–36.0)
MCV: 99.3 fL (ref 80.0–100.0)
Monocytes Absolute: 0.7 10*3/uL (ref 0.1–1.0)
Monocytes Relative: 14 %
Neutro Abs: 3.3 10*3/uL (ref 1.7–7.7)
Neutrophils Relative %: 67 %
Platelets: 474 10*3/uL — ABNORMAL HIGH (ref 150–400)
RBC: 2.8 MIL/uL — ABNORMAL LOW (ref 3.87–5.11)
RDW: 13.9 % (ref 11.5–15.5)
WBC: 5 10*3/uL (ref 4.0–10.5)
nRBC: 0 % (ref 0.0–0.2)

## 2019-06-26 LAB — LIPASE, BLOOD: Lipase: 45 U/L (ref 11–51)

## 2019-06-26 MED ORDER — IOHEXOL 300 MG/ML  SOLN
90.0000 mL | Freq: Once | INTRAMUSCULAR | Status: AC | PRN
  Administered 2019-06-26: 90 mL via INTRAVENOUS

## 2019-06-26 NOTE — ED Triage Notes (Signed)
Pt bib ems, reports abdominal distention. Pain in the abdomen and no owel movement for the last 3 days. Pt has eaten today. 210/96 74hr  16rr 123cbg 98%ra

## 2019-06-26 NOTE — ED Notes (Signed)
Pt grandaughter contacted per pt request.

## 2019-06-26 NOTE — ED Provider Notes (Signed)
[facility] EMERGENCY DEPARTMENT Provider Note   CSN: [id number] Arrival date & time: 06/26/19  2138     History Chief Complaint  Patient presents with  . Abdominal Pain    [name] is a 86 y.o. female.  [name] is a 86 y.o. female with a history of dementia, CAD, IBS, hypertension, hyperlipidemia who presents via EMS from skilled nursing facility for evaluation of abdominal distention.  Patient reports that her abdomen has become distended and uncomfortable over the past 3 to 4 days, she states she has been unable to have a bowel movement over the past 3 days.  She has not had any nausea or vomiting but does report decreased appetite she has been able to eat a little something today.  She denies any associated chest pain or shortness of breath, no fevers or cough, no urinary symptoms.  She has not noticed any blood in her stool.  History is limited as patient reports she cannot remember some things because of her dementia.        Past Medical History:  Diagnosis Date  . Anxiety   . Arthritis   . CAD (coronary artery disease)   . Chondrocostal junction syndrome   . Chronic fatigue   . Dementia ([place])    without Behavioral Disturbance  . GERD (gastroesophageal reflux disease)   . Hx of CABG   . Hyperlipidemia    GOAL LDL <70  . Hypertension   . IBS (irritable bowel syndrome)   . Jaundice    yellow jaundice 3rd grade  . Major depressive disorder   . Mild chronic anemia   . Mixed incontinence   . Multiple myeloma (HCC)    not having achieved remission  . NSTEMI (non-ST elevated myocardial infarction) ([place]) 01/31/2017  . Osteopenia   . Ovarian cyst, bilateral    Rt complex  . Panic disorder   . Pulmonary nodules   . Raynaud's syndrome   . Restless legs syndrome   . Spinal stenosis of lumbar region   . Thyroid nodule     Patient Active Problem List   Diagnosis Date Noted  . AMS (altered mental status) 05/18/2019  . Recurrent  urinary tract infection 05/18/2019  . Left hip pain 06/30/2018  . Disc degeneration, lumbar 06/23/2018  . Left thyroid nodule 08/29/2017  . Chronic idiopathic constipation 08/18/2017  . Abdominal pain 07/31/2017  . Mesenteric ischemia ([place]) 05/04/2017  . Unsteadiness on feet   . Unstable angina pectoris ([place])   . Tinea pedis   . Spinal stenosis of lumbar region   . Somnolence   . Seasonal allergic rhinitis   . Restless legs syndrome   . Raynaud's syndrome   . Raynauds syndrome   . Radiculopathy   . Panic disorder   . Ovarian cyst, bilateral   . Osteopenia   . Myocardial infarction ([place])   . Muscle weakness (generalized)   . Mixed incontinence   . Mild chronic anemia   . Major depressive disorder   . Long term (current) use of aspirin   . Intermediate coronary syndrome ([place])   . Incontinent of urine   . IBS (irritable bowel syndrome)   . Hypercholesteremia   . Hx of CABG   . GERD (gastroesophageal reflux disease)   . Dementia ([place])   . Chest pain   . Atherosclerotic heart disease of native coronary artery without angina pectoris   . Arthritis   . Anxiety   . Allergic rhinitis   .  Abnormal weight loss   . History of adenomatous polyp of colon 03/02/2017  . NSTEMI (non-ST elevated myocardial infarction) ([place]) 02/02/2017  . SOB (shortness of breath) 09/02/2016  . Dizziness 09/02/2016  . Non-ST elevation (NSTEMI) myocardial infarction ([place]) 11/26/2015  . Polymyalgia rheumatica ([place]) 11/26/2015  . Hypokalemia 11/26/2015  . Mild persistent asthma 11/12/2015  . Coronary artery disease involving autologous artery coronary bypass graft with angina pectoris with documented spasm ([place]) 11/12/2015  . Multiple myeloma in remission ([place]) 11/12/2015  . Current use of beta blocker 11/12/2015  . Gastroesophageal reflux disease without esophagitis 11/12/2015  . Other allergic rhinitis 11/12/2015  . Weight loss, non-intentional 10/03/2015  . Asthma 09/24/2015  . Asthma in adult  without complication 13/14/3888  . Seasonal allergic rhinitis due to pollen 09/24/2015  . Cervical radiculopathy 07/25/2015  . Chronic coronary artery disease 07/25/2015  . Coronary artery disease 07/25/2015  . Disturbance in sleep behavior 07/25/2015  . Elevated CK 07/25/2015  . Elevated creatine kinase level 07/25/2015  . Generalized osteoarthritis of hand 07/25/2015  . Other long term (current) drug therapy 07/25/2015  . Overactive bladder 07/25/2015  . Restless leg 07/25/2015  . Restless leg syndrome 07/25/2015  . Sleep disturbances 07/25/2015  . Idiopathic peripheral neuropathy 07/04/2015  . Lumbar radiculopathy, chronic 07/04/2015  . Mixed dementia ([place]) 07/04/2015  . Risk for falls 07/04/2015  . Sleep disturbance 07/04/2015  . Chest pain with moderate risk for cardiac etiology 06/13/2015  . Right sciatic nerve pain 10/19/2014  . Abnormal thyroid blood test 08/25/2014  . Chronic fatigue 08/09/2014  . Multiple myeloma ([place]) 07/21/2013  . Depression 01/14/2013  . Generalized anxiety disorder 01/14/2013  . Gastroesophageal reflux disease 01/13/2013  . Urine test positive for microalbuminuria 12/08/2011  . Cyst of ovary 12/17/2010  . KNEE PAIN, BILATERAL 08/15/2010  . Abnormal gait 08/15/2010  . Atherosclerosis of coronary artery bypass graft 10/11/2009  . CHEST PAIN, NON-CARDIAC 10/11/2009  . HIP PAIN, RIGHT 08/13/2009  . GREATER TROCHANTERIC BURSITIS 08/13/2009  . LUMBOSACRAL SPONDYLOSIS WITHOUT MYELOPATHY 11/10/2008  . Osteoarthritis of lumbosacral spine without myelopathy 11/10/2008  . FOOT PAIN, BILATERAL 06/30/2008  . Hyperlipidemia 06/22/2008  . Hypertension 06/22/2008  . Raynaud's disease 06/22/2008  . BURSITIS, LEFT SHOULDER 06/22/2008    Past Surgical History:  Procedure Laterality Date  . BUNIONECTOMY WITH HAMMERTOE RECONSTRUCTION Right 08/04/2012   Procedure:  HAMMERTOE RECONSTRUCTION;  Surgeon: Colin Rhein, MD;  Location: [place];   Service: Orthopedics;  Laterality: Right;  HAMMER TOE RECONSTRUCTION PROBABLE FLEXOR DIGITORUM LONGUS TO PROXIMAL PHALANX TRANSFER LATERALIZED   . CAPSULOTOMY Right 08/04/2012   Procedure: CAPSULOTOMY;  Surgeon: Colin Rhein, MD;  Location: [place] [facility];  Service: Orthopedics;  Laterality: Right;  RIGHT 2ND TOE METATARSOPHALANGEAL JOINT DORSAL CAPSULOTOMY   . CATARACT EXTRACTION, BILATERAL     both cataracts  . COLONOSCOPY    . CORONARY ANGIOPLASTY WITH STENT PLACEMENT  2014   2 stents  . CORONARY ARTERY BYPASS GRAFT  2007   LIMA to LAD, SVG to RCA  non-ST segment MI 08/2009 (Dr. Tamala Julian)  . CORONARY STENT INTERVENTION N/A 02/02/2017   Procedure: CORONARY STENT INTERVENTION;  Surgeon: Belva Crome, MD;  Location: [place] CV [facility];  Service: Cardiovascular;  Laterality: N/A;  Prox CFX  . DILATION AND CURETTAGE OF UTERUS    . LEFT HEART CATH AND CORS/GRAFTS ANGIOGRAPHY N/A 02/02/2017   Procedure: LEFT HEART CATH AND CORS/GRAFTS ANGIOGRAPHY;  Surgeon: Belva Crome, MD;  Location: [place]  CV LAB;  Service: Cardiovascular;  Laterality: N/A;  . LEFT HEART CATHETERIZATION WITH CORONARY/GRAFT ANGIOGRAM N/A 07/09/2012   Procedure: LEFT HEART CATHETERIZATION WITH Beatrix Fetters;  Surgeon: Sinclair Grooms, MD;  Location: [facility] CATH LAB;  Service: Cardiovascular;  Laterality: N/A;  . TONSILLECTOMY AND ADENOIDECTOMY    . UPPER GASTROINTESTINAL ENDOSCOPY    . VISCERAL ANGIOGRAPHY N/A 04/15/2017   Procedure: VISCERAL ANGIOGRAPHY;  Surgeon: Waynetta Sandy, MD;  Location: [place] CV LAB;  Service: Cardiovascular;  Laterality: N/A;     OB History   No obstetric history on file.     Family History  Problem Relation Age of Onset  . Heart attack Mother   . CAD Mother   . Obesity Sister   . Breast cancer Paternal Aunt   . Ulcers Father   . Asthma Neg Hx   . Eczema Neg Hx   . Urticaria Neg Hx   . Allergic rhinitis Neg Hx   . Angioedema Neg Hx   . Atopy Neg Hx    . Immunodeficiency Neg Hx     Social History   Tobacco Use  . Smoking status: Never Smoker  . Smokeless tobacco: Never Used  Substance Use Topics  . Alcohol use: Never  . Drug use: No    Home Medications Prior to Admission medications   Medication Sig Start Date End Date Taking? Authorizing Provider  acetaminophen (TYLENOL) 650 MG CR tablet Take 1,300 mg by mouth 2 (two) times daily.    [provider]  Alum Hydroxide-Mag Carbonate (GAVISCON EXTRA STRENGTH) 5194098157 MG/10ML SUSP Take 15 mLs by mouth 4 (four) times daily as needed (for reflux).     [provider]  Artificial Tear Solution (SOOTHE XP) SOLN Place 1 drop into both eyes 2 (two) times daily.    [provider]  aspirin EC 81 MG tablet Take 1 tablet (81 mg total) by mouth daily. 01/21/16   Minus Breeding, MD  baclofen 5 MG TABS Take 5 mg by mouth 3 (three) times daily as needed for muscle spasms. 07/02/18   Debbe Odea, MD  Calcium Carb-Cholecalciferol (CALCIUM 600+D3) 600-200 MG-UNIT TABS Take 1 tablet by mouth 2 (two) times daily.    [provider]  carvedilol (COREG) 3.125 MG tablet Take 1 tablet (3.125 mg total) by mouth 2 (two) times daily with a meal. 01/05/18   Minus Breeding, MD  cholestyramine (QUESTRAN) 4 g packet Take 4 g by mouth as directed. 1/2 pack every 2 days    [provider]  Cranberry (ELLURA) 200 MG CAPS Take 200 mg by mouth daily.    [provider]  cycloSPORINE (RESTASIS) 0.05 % ophthalmic emulsion Place 1 drop into both eyes 2 (two) times daily.     [provider]  D-Mannose POWD Take 1 Scoop by mouth daily.    [provider]  docusate sodium (COLACE) 100 MG capsule Take 100 mg by mouth See admin instructions. Take 100 mg by mouth once a day and an additional 100 mg as needed for constipation    [provider]  famotidine (PEPCID) 20 MG tablet Take 1 tablet (20 mg total) by mouth 2 (two) times daily. 09/24/18    Pyrtle, Lajuan Lines, MD  furosemide (LASIX) 20 MG tablet Take 1 tablet (20 mg total) by mouth daily. 04/29/19 07/28/19  Lendon Colonel, NP  gabapentin (NEURONTIN) 100 MG capsule Take 100 mg by mouth 2 (two) times daily.     [provider]  hydrALAZINE (APRESOLINE) 10 MG tablet Take 1 tablet (10 mg total) by mouth daily at 3 pm. Patient taking differently: Take 10 mg by mouth See admin instructions. Take 10 mg by mouth daily at 2:30 PM and an additional 10 mg two times a day as needed if Systolic reading is >151 76/16/07   Almyra Deforest, PA  iron polysaccharides (NU-IRON) 150 MG capsule Take 150 mg by mouth 2 (two) times daily.    [provider]  isosorbide mononitrate (IMDUR) 60 MG 24 hr tablet Take 60 mg by mouth daily.    [provider]  lidocaine (LIDODERM) 5 % Place 1 patch onto the skin daily. Remove & Discard patch within 12 hours or as directed by MD 05/19/19   Domenic Polite, MD  Multiple Vitamins-Minerals (CENTRUM SILVER 50+WOMEN) TABS Take 1 tablet by mouth daily.    [provider]  nitroGLYCERIN (NITROSTAT) 0.4 MG SL tablet Place 1 tablet (0.4 mg total) under the tongue every 5 (five) minutes as needed for chest pain. Reported on 11/12/2015 02/03/17   Lyda Jester M, PA-C  polyethylene glycol (MIRALAX / GLYCOLAX) packet Take 17 g by mouth 2 (two) times daily. Patient taking differently: Take 17 g by mouth daily.  02/23/18   Pyrtle, Lajuan Lines, MD  prednisoLONE acetate (PRED FORTE) 1 % ophthalmic suspension Place 1 drop into both eyes 2 (two) times daily as needed (irritation).     [provider]  Propylene Glycol (SYSTANE BALANCE) 0.6 % SOLN Place 1 drop into both eyes 4 (four) times daily as needed (irritation).     [provider]  psyllium (HYDROCIL/METAMUCIL) 95 % PACK Take 1 packet by mouth 2 (two) times daily as needed for mild constipation or moderate constipation.    [provider]  sertraline (ZOLOFT) 25 MG tablet Take 25  mg by mouth daily.    [provider]  sertraline (ZOLOFT) 50 MG tablet Take 50 mg by mouth every evening.    [provider]  traMADol (ULTRAM) 50 MG tablet Take 50 mg by mouth every 8 (eight) hours as needed for moderate pain.     [provider]    Allergies    Butazolidin [phenylbutazone], Cephalosporins, Levaquin [levofloxacin], Morphine and related, Trimethoprim, Aleve [naproxen], Buprenorphine, Cilostazol, Codeine, Dexilant [dexlansoprazole], Diclofenac, Erythromycin, Lactose intolerance (gi), Macrodantin, Morphine, Simvastatin, Sulfa antibiotics, Vesicare [solifenacin], Clindamycin/lincomycin, Doxycycline, Food, Ibuprofen, Lincomycin hcl, Penicillins, Toviaz [fesoterodine fumarate er], and Voltaren [diclofenac sodium]  Review of Systems   Review of Systems  Constitutional: Positive for appetite change. Negative for chills and fever.  Respiratory: Negative for cough and shortness of breath.   Cardiovascular: Negative for chest pain.  Gastrointestinal: Positive for abdominal distention, abdominal pain and constipation. Negative for nausea and vomiting.  Genitourinary: Negative for dysuria and frequency.  Musculoskeletal: Negative for arthralgias and myalgias.  Skin: Negative for color change and rash.  All other systems reviewed and are negative.   Physical Exam Updated Vital Signs BP (!) 196/74 (BP Location: Left Arm)   Pulse 73   Temp 98.5 F (36.9 C) (Oral)   Resp 18   Ht '5\' 3"'  (1.6 m)   Wt 42.2 kg   SpO2 97%   BMI 16.47 kg/m   Physical Exam Vitals and nursing note reviewed.  Constitutional:      General: She is not in acute distress.    Appearance: She is well-developed and normal weight. She is not ill-appearing or diaphoretic.  HENT:     Head: Normocephalic and  atraumatic.  Eyes:     General:        Right eye: No discharge.        Left eye: No discharge.     Pupils: Pupils are equal, round, and reactive to light.  Cardiovascular:      Rate and Rhythm: Normal rate and regular rhythm.     Heart sounds: Normal heart sounds.  Pulmonary:     Effort: Pulmonary effort is normal. No respiratory distress.     Breath sounds: Normal breath sounds. No wheezing or rales.     Comments: Respirations equal and unlabored, patient able to speak in full sentences, lungs clear to auscultation bilaterally Abdominal:     General: Bowel sounds are normal. There is distension.     Palpations: Abdomen is soft. There is no mass.     Tenderness: There is generalized abdominal tenderness. There is no guarding.     Comments: Abdomen is mildly distended but soft with some generalized tenderness, no rigidity or peritoneal signs.  Musculoskeletal:        General: No deformity.     Cervical back: Neck supple.  Skin:    General: Skin is warm and dry.     Capillary Refill: Capillary refill takes less than 2 seconds.  Neurological:     Mental Status: She is alert.     Coordination: Coordination normal.     Comments: Speech is clear, able to follow commands Moves extremities without ataxia, coordination intact  Psychiatric:        Mood and Affect: Mood normal.        Behavior: Behavior normal.     ED Results / Procedures / Treatments   Labs (all labs ordered are listed, but only abnormal results are displayed) Labs Reviewed - No data to display  EKG None  Radiology No results found.  Procedures Procedures (including critical care time)  Medications Ordered in ED Medications - No data to display  ED Course  I have reviewed the triage vital signs and the nursing notes.  Pertinent labs & imaging results that were available during my care of the patient were reviewed by me and considered in my medical decision making (see chart for details).  Clinical Course as of Jun 25 2342  Nancy Fetter Jun 25, 3965  [id number] 86 year old female presenting with abdominal discomfort and distention and constipation, hypertensive but otherwise well-appearing with  normal vitals.  No peritoneal signs on exam but patient is distended, will check abdominal labs and CT scan to assess for any obstruction or pathologic region for constipation.  Called and updated patient's granddaughter regarding plan of care   [KF]  2230 No leukocytosis, hemoglobin unchanged from labs in December, patient is not having any bleeding symptoms.  CBC with Differential(!) [KF]  2300 No acute electrolyte derangements, normal renal and liver function  Comprehensive metabolic panel(!) [KF]  4098 UA with many bacteria but no other signs of infection, will send for culture, patient is not having any urinary symptoms at this time  Urinalysis, Routine w reflex microscopic(!) [KF]  2332 Care signed out to PA [facility] pending CT, if reassuring anticipate discharge   [KF]    Clinical Course User Index [KF] Janet Berlin   MDM Rules/Calculators/A&P                     86 year old female presents with 3 days of constipation with generalized abdominal discomfort and distention, no vomiting but she has had decreased appetite.  Overall well-appearing, hypertensive but vitals otherwise normal.  Patient does not have peritoneal signs on exam but will get abdominal labs and CT to assess for any obstruction or pathologic cause for constipation.  Lab work is reassuring.  CT pending at shift change, care signed out to PA [facility]  Final Clinical Impression(s) / ED Diagnoses Final diagnoses:  Abdominal distension  Constipation, unspecified constipation type    Rx / DC Orders ED Discharge Orders    None       Janet Berlin 06/26/19 2350    Noemi Chapel, MD 06/27/19 2229    Noemi Chapel, MD 06/27/19 2231

## 2019-06-26 NOTE — ED Provider Notes (Signed)
Here from [facility] with c/o AP, constipation Decreased appetite Has distended abdomen on exam, in NAD, no vomiting Otherwise well Labs reassuring, no fever.  CT pending  Plan: reassess patient and review CT to determine treatment and disposition.   CT concerning for extensive constipation with dilated bowel up to 9 cm. Also shows incidental findings of T10 split type compression fx with 40% loss of height.  There is also irregular sclerosis of bilateral pubic bodies with fx's of the left pubic body extending into the symphysis pubis, likely related to insufficiency fx's. Noted that the patient has a history of multiple myeloma, in remission.   These results were relayed to and discussed with family member Sharyn Lull, granddaughter) with recommendation for outpatient follow up for the fractures.   On correlation of the back pain the patient reports pain for the past 2 weeks. Has had several falls.   Also discussed plan of care with the family. Will focus on decompressing the distended bowel and resolving constipation with oral laxatives. She will have to drink a large quantity of laxative and family reports it is hard to get her to drink. Will place NG tube and administer laxative with the end goal of discharge from the department rather than admission.   4:40 - patient reports she is passing increasing amounts of gas and feels she needs to have a bowel movement. Will get her up to bedside commode and recheck afterward for progress. Tolerating GoLytely well.   6:00 - patient up the bedside commode again. No stool. She is passing more gas. BS present. Distention is mildly improved.   7:15 - GoLytely via NG as well as PO now going. Discussed possible discharge back to [facility] to their rehab unit (she is in assisted living) to complete the medication/laxative, however, Midwife declines stating discomfort with NG tube care and the possibility of persistent constipation despite this treatment.  Will continue to treat in the ED.   8:00 - discussed possible obs admission for ongoing bowel cleansing and care. Discussed other recommendations with GI.   8:50 - patient up to the toilet and passed a very large bowel movement. No pain or distress. Admission cancelled as goal of care has been reached. Family updated. Plan is to discharge back to [facility] after a short obs period to insure no pain/distress.    Charlann Lange, PA-C 06/27/19 [id number]    Noemi Chapel, MD 06/27/19 2232

## 2019-06-27 ENCOUNTER — Emergency Department (HOSPITAL_COMMUNITY): Payer: PPO

## 2019-06-27 DIAGNOSIS — K5641 Fecal impaction: Secondary | ICD-10-CM

## 2019-06-27 DIAGNOSIS — R52 Pain, unspecified: Secondary | ICD-10-CM | POA: Diagnosis not present

## 2019-06-27 DIAGNOSIS — R5381 Other malaise: Secondary | ICD-10-CM | POA: Diagnosis not present

## 2019-06-27 DIAGNOSIS — Z743 Need for continuous supervision: Secondary | ICD-10-CM | POA: Diagnosis not present

## 2019-06-27 DIAGNOSIS — F29 Unspecified psychosis not due to a substance or known physiological condition: Secondary | ICD-10-CM | POA: Diagnosis not present

## 2019-06-27 DIAGNOSIS — R279 Unspecified lack of coordination: Secondary | ICD-10-CM | POA: Diagnosis not present

## 2019-06-27 DIAGNOSIS — Z4682 Encounter for fitting and adjustment of non-vascular catheter: Secondary | ICD-10-CM | POA: Diagnosis not present

## 2019-06-27 LAB — SARS CORONAVIRUS 2 (TAT 6-24 HRS): SARS Coronavirus 2: NEGATIVE

## 2019-06-27 MED ORDER — CHOLESTYRAMINE 4 G PO PACK: 4.0000 g | PACK | ORAL | Status: DC

## 2019-06-27 MED ORDER — SERTRALINE HCL 50 MG PO TABS: 50.0000 mg | ORAL_TABLET | Freq: Every evening | ORAL | Status: DC

## 2019-06-27 MED ORDER — SOOTHE XP OP SOLN: 1.0000 [drp] | Freq: Two times a day (BID) | OPHTHALMIC | Status: DC

## 2019-06-27 MED ORDER — FUROSEMIDE 20 MG PO TABS: 20.0000 mg | ORAL_TABLET | Freq: Every day | ORAL | Status: DC

## 2019-06-27 MED ORDER — DOCUSATE SODIUM 100 MG PO CAPS: 100.0000 mg | ORAL_CAPSULE | ORAL | Status: DC

## 2019-06-27 MED ORDER — GABAPENTIN 100 MG PO CAPS: 100.0000 mg | ORAL_CAPSULE | Freq: Two times a day (BID) | ORAL | Status: DC

## 2019-06-27 MED ORDER — TRAMADOL HCL 50 MG PO TABS: 50.0000 mg | ORAL_TABLET | Freq: Three times a day (TID) | ORAL | Status: DC | PRN

## 2019-06-27 MED ORDER — HEPARIN SODIUM (PORCINE) 5000 UNIT/ML IJ SOLN: 5000.0000 [IU] | Freq: Two times a day (BID) | INTRAMUSCULAR | Status: DC

## 2019-06-27 MED ORDER — ACETAMINOPHEN ER 650 MG PO TBCR: 1300.0000 mg | EXTENDED_RELEASE_TABLET | Freq: Two times a day (BID) | ORAL | Status: DC

## 2019-06-27 MED ORDER — PSYLLIUM 95 % PO PACK: 1.0000 | PACK | Freq: Two times a day (BID) | ORAL | Status: DC | PRN

## 2019-06-27 MED ORDER — BISACODYL 10 MG RE SUPP: 10.0000 mg | Freq: Every day | RECTAL | Status: DC | PRN

## 2019-06-27 MED ORDER — ONDANSETRON HCL 4 MG PO TABS: 4.0000 mg | ORAL_TABLET | Freq: Four times a day (QID) | ORAL | Status: DC | PRN

## 2019-06-27 MED ORDER — PROPYLENE GLYCOL 0.6 % OP SOLN: 1.0000 [drp] | Freq: Four times a day (QID) | OPHTHALMIC | Status: DC | PRN

## 2019-06-27 MED ORDER — ONDANSETRON HCL 4 MG/2ML IJ SOLN: 4.0000 mg | Freq: Four times a day (QID) | INTRAMUSCULAR | Status: DC | PRN

## 2019-06-27 MED ORDER — PREDNISOLONE ACETATE 1 % OP SUSP: 1.0000 [drp] | Freq: Two times a day (BID) | OPHTHALMIC | Status: DC | PRN

## 2019-06-27 MED ORDER — POLYSACCHARIDE IRON COMPLEX 150 MG PO CAPS: 150.0000 mg | ORAL_CAPSULE | Freq: Two times a day (BID) | ORAL | Status: DC

## 2019-06-27 MED ORDER — POLYETHYLENE GLYCOL 3350 17 G PO PACK: 17.0000 g | PACK | Freq: Two times a day (BID) | ORAL | Status: DC

## 2019-06-27 MED ORDER — CALCIUM CARB-CHOLECALCIFEROL 600-200 MG-UNIT PO TABS: 1.0000 | ORAL_TABLET | Freq: Two times a day (BID) | ORAL | Status: DC

## 2019-06-27 MED ORDER — SODIUM CHLORIDE 0.9 % IV SOLN: INTRAVENOUS | Status: DC

## 2019-06-27 MED ORDER — ACETAMINOPHEN 325 MG PO TABS
650.0000 mg | ORAL_TABLET | Freq: Once | ORAL | Status: AC
  Administered 2019-06-27: 650 mg via ORAL
  Filled 2019-06-27: qty 2

## 2019-06-27 MED ORDER — PEG 3350-KCL-NA BICARB-NACL 420 G PO SOLR
4000.0000 mL | Freq: Once | ORAL | Status: AC
  Administered 2019-06-27: 4000 mL via ORAL
  Filled 2019-06-27: qty 4000

## 2019-06-27 MED ORDER — ASPIRIN EC 81 MG PO TBEC: 81.0000 mg | DELAYED_RELEASE_TABLET | Freq: Every day | ORAL | Status: DC

## 2019-06-27 MED ORDER — NITROGLYCERIN 0.4 MG SL SUBL: 0.4000 mg | SUBLINGUAL_TABLET | SUBLINGUAL | Status: DC | PRN

## 2019-06-27 MED ORDER — ISOSORBIDE MONONITRATE ER 30 MG PO TB24: 60.0000 mg | ORAL_TABLET | Freq: Every day | ORAL | Status: DC

## 2019-06-27 MED ORDER — FAMOTIDINE 20 MG PO TABS: 20.0000 mg | ORAL_TABLET | Freq: Two times a day (BID) | ORAL | Status: DC

## 2019-06-27 MED ORDER — CRANBERRY 200 MG PO CAPS: 200.0000 mg | ORAL_CAPSULE | Freq: Every day | ORAL | Status: DC

## 2019-06-27 MED ORDER — CYCLOSPORINE 0.05 % OP EMUL: 1.0000 [drp] | Freq: Two times a day (BID) | OPHTHALMIC | Status: DC

## 2019-06-27 MED ORDER — ALUM HYDROXIDE-MAG CARBONATE 508-475 MG/10ML PO SUSP: 15.0000 mL | Freq: Four times a day (QID) | ORAL | Status: DC | PRN

## 2019-06-27 MED ORDER — HYDRALAZINE HCL 10 MG PO TABS: 10.0000 mg | ORAL_TABLET | Freq: Three times a day (TID) | ORAL | Status: DC | PRN

## 2019-06-27 MED ORDER — CARVEDILOL 3.125 MG PO TABS
3.1250 mg | ORAL_TABLET | Freq: Two times a day (BID) | ORAL | Status: DC
  Administered 2019-06-27: 3.125 mg via ORAL
  Filled 2019-06-27 (×3): qty 1

## 2019-06-27 MED ORDER — SERTRALINE HCL 25 MG PO TABS: 25.0000 mg | ORAL_TABLET | Freq: Every day | ORAL | Status: DC

## 2019-06-27 NOTE — ED Notes (Signed)
Pt had a soft liquid black stool.

## 2019-06-27 NOTE — ED Notes (Signed)
Kangaroo pump rate increased to 200 mL/hr with 50 mL bolus q15-60m, pt tolerating well. Pt drinking polyethylene glycol solution in addition to NG tube infusion.

## 2019-06-27 NOTE — ED Notes (Signed)
Lunch Tray Ordered @ [id number].

## 2019-06-27 NOTE — ED Notes (Signed)
PTAR called @ 1149-per Benjamine Mola, RN called by Levada Dy

## 2019-06-27 NOTE — ED Notes (Signed)
Granddaughter updated on pt status via telephone with pt permission

## 2019-06-27 NOTE — ED Notes (Signed)
Did not give soap suds enema, pt passing large amounts of stool

## 2019-06-27 NOTE — ED Notes (Signed)
Pt placed on bedside commode with call light in reach by ED tech

## 2019-06-27 NOTE — ED Notes (Signed)
Pt dressed in paper scrubs and brief placed

## 2019-06-27 NOTE — ED Notes (Signed)
Report given to [place], nurse at facility.

## 2019-06-27 NOTE — Discharge Instructions (Addendum)
Ms. Fleetwood was treated for severe constipation in the emergency department with GoLytely bowel prep and has had multiple large bowel movements. She is felt appropriate for discharge back to the assisted living at [facility].   Per gastroenterology, suggestions for regular maintenance were provided for consideration: Iron and calcium supplements are culprits for constipation; Miralax twice daily. Stool softeners daily as well.

## 2019-06-28 LAB — URINE CULTURE: Culture: 50000 — AB

## 2019-06-29 ENCOUNTER — Telehealth: Payer: Self-pay

## 2019-06-29 NOTE — Telephone Encounter (Signed)
No treatment for [facility] ED 06/27/19 per Caryl Asp PA

## 2019-07-01 ENCOUNTER — Ambulatory Visit: Payer: PPO | Admitting: Cardiology

## 2019-07-01 DIAGNOSIS — R140 Abdominal distension (gaseous): Secondary | ICD-10-CM | POA: Diagnosis not present

## 2019-07-01 DIAGNOSIS — E871 Hypo-osmolality and hyponatremia: Secondary | ICD-10-CM | POA: Diagnosis not present

## 2019-07-01 DIAGNOSIS — R109 Unspecified abdominal pain: Secondary | ICD-10-CM | POA: Diagnosis not present

## 2019-07-04 DIAGNOSIS — R2681 Unsteadiness on feet: Secondary | ICD-10-CM | POA: Diagnosis not present

## 2019-07-04 DIAGNOSIS — M48061 Spinal stenosis, lumbar region without neurogenic claudication: Secondary | ICD-10-CM | POA: Diagnosis not present

## 2019-07-04 DIAGNOSIS — Z9181 History of falling: Secondary | ICD-10-CM | POA: Diagnosis not present

## 2019-07-04 DIAGNOSIS — M545 Low back pain: Secondary | ICD-10-CM | POA: Diagnosis not present

## 2019-07-04 DIAGNOSIS — S32009D Unspecified fracture of unspecified lumbar vertebra, subsequent encounter for fracture with routine healing: Secondary | ICD-10-CM | POA: Diagnosis not present

## 2019-07-05 DIAGNOSIS — N390 Urinary tract infection, site not specified: Secondary | ICD-10-CM | POA: Diagnosis not present

## 2019-07-07 DIAGNOSIS — S32009D Unspecified fracture of unspecified lumbar vertebra, subsequent encounter for fracture with routine healing: Secondary | ICD-10-CM | POA: Diagnosis not present

## 2019-07-07 DIAGNOSIS — M545 Low back pain: Secondary | ICD-10-CM | POA: Diagnosis not present

## 2019-07-07 DIAGNOSIS — R2681 Unsteadiness on feet: Secondary | ICD-10-CM | POA: Diagnosis not present

## 2019-07-07 DIAGNOSIS — M48061 Spinal stenosis, lumbar region without neurogenic claudication: Secondary | ICD-10-CM | POA: Diagnosis not present

## 2019-07-07 DIAGNOSIS — Z9181 History of falling: Secondary | ICD-10-CM | POA: Diagnosis not present

## 2019-07-12 DIAGNOSIS — Z9181 History of falling: Secondary | ICD-10-CM | POA: Diagnosis not present

## 2019-07-12 DIAGNOSIS — M545 Low back pain: Secondary | ICD-10-CM | POA: Diagnosis not present

## 2019-07-12 DIAGNOSIS — M48061 Spinal stenosis, lumbar region without neurogenic claudication: Secondary | ICD-10-CM | POA: Diagnosis not present

## 2019-07-12 DIAGNOSIS — S32009D Unspecified fracture of unspecified lumbar vertebra, subsequent encounter for fracture with routine healing: Secondary | ICD-10-CM | POA: Diagnosis not present

## 2019-07-12 DIAGNOSIS — R2681 Unsteadiness on feet: Secondary | ICD-10-CM | POA: Diagnosis not present

## 2019-07-13 DIAGNOSIS — R634 Abnormal weight loss: Secondary | ICD-10-CM | POA: Diagnosis not present

## 2019-07-13 DIAGNOSIS — Z7982 Long term (current) use of aspirin: Secondary | ICD-10-CM | POA: Diagnosis not present

## 2019-07-13 DIAGNOSIS — I2510 Atherosclerotic heart disease of native coronary artery without angina pectoris: Secondary | ICD-10-CM | POA: Diagnosis not present

## 2019-07-13 DIAGNOSIS — I10 Essential (primary) hypertension: Secondary | ICD-10-CM | POA: Diagnosis not present

## 2019-07-13 DIAGNOSIS — E785 Hyperlipidemia, unspecified: Secondary | ICD-10-CM | POA: Diagnosis not present

## 2019-07-14 DIAGNOSIS — R2681 Unsteadiness on feet: Secondary | ICD-10-CM | POA: Diagnosis not present

## 2019-07-14 DIAGNOSIS — Z9181 History of falling: Secondary | ICD-10-CM | POA: Diagnosis not present

## 2019-07-14 DIAGNOSIS — M48061 Spinal stenosis, lumbar region without neurogenic claudication: Secondary | ICD-10-CM | POA: Diagnosis not present

## 2019-07-14 DIAGNOSIS — M545 Low back pain: Secondary | ICD-10-CM | POA: Diagnosis not present

## 2019-07-14 DIAGNOSIS — S32009D Unspecified fracture of unspecified lumbar vertebra, subsequent encounter for fracture with routine healing: Secondary | ICD-10-CM | POA: Diagnosis not present

## 2019-07-15 DIAGNOSIS — M48061 Spinal stenosis, lumbar region without neurogenic claudication: Secondary | ICD-10-CM | POA: Diagnosis not present

## 2019-07-15 DIAGNOSIS — M545 Low back pain: Secondary | ICD-10-CM | POA: Diagnosis not present

## 2019-07-15 DIAGNOSIS — R2681 Unsteadiness on feet: Secondary | ICD-10-CM | POA: Diagnosis not present

## 2019-07-15 DIAGNOSIS — S32009D Unspecified fracture of unspecified lumbar vertebra, subsequent encounter for fracture with routine healing: Secondary | ICD-10-CM | POA: Diagnosis not present

## 2019-07-15 DIAGNOSIS — Z9181 History of falling: Secondary | ICD-10-CM | POA: Diagnosis not present

## 2019-07-18 DIAGNOSIS — R2681 Unsteadiness on feet: Secondary | ICD-10-CM | POA: Diagnosis not present

## 2019-07-18 DIAGNOSIS — M545 Low back pain: Secondary | ICD-10-CM | POA: Diagnosis not present

## 2019-07-18 DIAGNOSIS — M48061 Spinal stenosis, lumbar region without neurogenic claudication: Secondary | ICD-10-CM | POA: Diagnosis not present

## 2019-07-18 DIAGNOSIS — Z9181 History of falling: Secondary | ICD-10-CM | POA: Diagnosis not present

## 2019-07-18 DIAGNOSIS — S32009D Unspecified fracture of unspecified lumbar vertebra, subsequent encounter for fracture with routine healing: Secondary | ICD-10-CM | POA: Diagnosis not present

## 2019-07-19 DIAGNOSIS — S32009D Unspecified fracture of unspecified lumbar vertebra, subsequent encounter for fracture with routine healing: Secondary | ICD-10-CM | POA: Diagnosis not present

## 2019-07-19 DIAGNOSIS — M545 Low back pain: Secondary | ICD-10-CM | POA: Diagnosis not present

## 2019-07-19 DIAGNOSIS — M48061 Spinal stenosis, lumbar region without neurogenic claudication: Secondary | ICD-10-CM | POA: Diagnosis not present

## 2019-07-19 DIAGNOSIS — Z9181 History of falling: Secondary | ICD-10-CM | POA: Diagnosis not present

## 2019-07-19 DIAGNOSIS — R2681 Unsteadiness on feet: Secondary | ICD-10-CM | POA: Diagnosis not present

## 2019-07-21 DIAGNOSIS — M545 Low back pain: Secondary | ICD-10-CM | POA: Diagnosis not present

## 2019-07-21 DIAGNOSIS — E876 Hypokalemia: Secondary | ICD-10-CM | POA: Diagnosis not present

## 2019-07-21 DIAGNOSIS — M48061 Spinal stenosis, lumbar region without neurogenic claudication: Secondary | ICD-10-CM | POA: Diagnosis not present

## 2019-07-21 DIAGNOSIS — R2681 Unsteadiness on feet: Secondary | ICD-10-CM | POA: Diagnosis not present

## 2019-07-21 DIAGNOSIS — Z9181 History of falling: Secondary | ICD-10-CM | POA: Diagnosis not present

## 2019-07-21 DIAGNOSIS — S32009D Unspecified fracture of unspecified lumbar vertebra, subsequent encounter for fracture with routine healing: Secondary | ICD-10-CM | POA: Diagnosis not present

## 2019-07-25 DIAGNOSIS — M48061 Spinal stenosis, lumbar region without neurogenic claudication: Secondary | ICD-10-CM | POA: Diagnosis not present

## 2019-07-25 DIAGNOSIS — S32009D Unspecified fracture of unspecified lumbar vertebra, subsequent encounter for fracture with routine healing: Secondary | ICD-10-CM | POA: Diagnosis not present

## 2019-07-25 DIAGNOSIS — M545 Low back pain: Secondary | ICD-10-CM | POA: Diagnosis not present

## 2019-07-25 DIAGNOSIS — Z9181 History of falling: Secondary | ICD-10-CM | POA: Diagnosis not present

## 2019-07-25 DIAGNOSIS — R2681 Unsteadiness on feet: Secondary | ICD-10-CM | POA: Diagnosis not present

## 2019-07-26 DIAGNOSIS — M545 Low back pain: Secondary | ICD-10-CM | POA: Diagnosis not present

## 2019-07-26 DIAGNOSIS — M48061 Spinal stenosis, lumbar region without neurogenic claudication: Secondary | ICD-10-CM | POA: Diagnosis not present

## 2019-07-26 DIAGNOSIS — Z9181 History of falling: Secondary | ICD-10-CM | POA: Diagnosis not present

## 2019-07-26 DIAGNOSIS — S32009D Unspecified fracture of unspecified lumbar vertebra, subsequent encounter for fracture with routine healing: Secondary | ICD-10-CM | POA: Diagnosis not present

## 2019-07-26 DIAGNOSIS — R2681 Unsteadiness on feet: Secondary | ICD-10-CM | POA: Diagnosis not present

## 2019-07-28 DIAGNOSIS — M545 Low back pain: Secondary | ICD-10-CM | POA: Diagnosis not present

## 2019-07-28 DIAGNOSIS — R2681 Unsteadiness on feet: Secondary | ICD-10-CM | POA: Diagnosis not present

## 2019-07-28 DIAGNOSIS — S32009D Unspecified fracture of unspecified lumbar vertebra, subsequent encounter for fracture with routine healing: Secondary | ICD-10-CM | POA: Diagnosis not present

## 2019-07-28 DIAGNOSIS — M48061 Spinal stenosis, lumbar region without neurogenic claudication: Secondary | ICD-10-CM | POA: Diagnosis not present

## 2019-07-28 DIAGNOSIS — Z9181 History of falling: Secondary | ICD-10-CM | POA: Diagnosis not present

## 2019-08-01 DIAGNOSIS — F0390 Unspecified dementia without behavioral disturbance: Secondary | ICD-10-CM | POA: Diagnosis not present

## 2019-08-01 DIAGNOSIS — R2681 Unsteadiness on feet: Secondary | ICD-10-CM | POA: Diagnosis not present

## 2019-08-01 DIAGNOSIS — M545 Low back pain: Secondary | ICD-10-CM | POA: Diagnosis not present

## 2019-08-01 DIAGNOSIS — K589 Irritable bowel syndrome without diarrhea: Secondary | ICD-10-CM | POA: Diagnosis not present

## 2019-08-01 DIAGNOSIS — S32009D Unspecified fracture of unspecified lumbar vertebra, subsequent encounter for fracture with routine healing: Secondary | ICD-10-CM | POA: Diagnosis not present

## 2019-08-01 DIAGNOSIS — Z9181 History of falling: Secondary | ICD-10-CM | POA: Diagnosis not present

## 2019-08-01 DIAGNOSIS — M48061 Spinal stenosis, lumbar region without neurogenic claudication: Secondary | ICD-10-CM | POA: Diagnosis not present

## 2019-08-01 DIAGNOSIS — E785 Hyperlipidemia, unspecified: Secondary | ICD-10-CM | POA: Diagnosis not present

## 2019-08-01 DIAGNOSIS — N3281 Overactive bladder: Secondary | ICD-10-CM | POA: Diagnosis not present

## 2019-08-01 DIAGNOSIS — I10 Essential (primary) hypertension: Secondary | ICD-10-CM | POA: Diagnosis not present

## 2019-08-02 ENCOUNTER — Other Ambulatory Visit: Payer: Self-pay

## 2019-08-02 ENCOUNTER — Encounter: Payer: Self-pay | Admitting: Sports Medicine

## 2019-08-02 ENCOUNTER — Telehealth: Payer: Self-pay | Admitting: Cardiology

## 2019-08-02 ENCOUNTER — Ambulatory Visit (INDEPENDENT_AMBULATORY_CARE_PROVIDER_SITE_OTHER): Payer: PPO | Admitting: Sports Medicine

## 2019-08-02 VITALS — Temp 97.9°F

## 2019-08-02 DIAGNOSIS — I739 Peripheral vascular disease, unspecified: Secondary | ICD-10-CM | POA: Diagnosis not present

## 2019-08-02 DIAGNOSIS — M79675 Pain in left toe(s): Secondary | ICD-10-CM | POA: Diagnosis not present

## 2019-08-02 DIAGNOSIS — L603 Nail dystrophy: Secondary | ICD-10-CM

## 2019-08-02 DIAGNOSIS — M79674 Pain in right toe(s): Secondary | ICD-10-CM | POA: Diagnosis not present

## 2019-08-02 DIAGNOSIS — R42 Dizziness and giddiness: Secondary | ICD-10-CM | POA: Diagnosis not present

## 2019-08-02 DIAGNOSIS — L600 Ingrowing nail: Secondary | ICD-10-CM | POA: Diagnosis not present

## 2019-08-02 DIAGNOSIS — I10 Essential (primary) hypertension: Secondary | ICD-10-CM | POA: Diagnosis not present

## 2019-08-02 NOTE — Telephone Encounter (Signed)
Spoke to daughter. She will not be able to come to appointment she lives in [place] and not available that day . Daughter states the [place] PA at [facility]  wanted the patient to be evaluated about irradiated heart rate. She did not think this would be good candidate for virtual.  patien's memory and cognitive ability is declining and a Member of the facitly will be with patient but daughter would like to present via  Speaker phone.  Daughter states she will ask patient's granddaughter,who is a Marine scientist ( susan's daughter) to be present,but if not would like for call during appointment. Informed  Daughter will  Notify  Curt Bears of the situatuion.  Offered other times  ,no other time are suitable.

## 2019-08-02 NOTE — Progress Notes (Signed)
Subjective: KIMLA FURTH is a 86 y.o. female patient seen today in office with complaint of mildly painful thickened and elongated toenails especially at 1st to on right that is a little sore, unable to trim.  No other issues at this time.  Patient assisted by care giver from [facility].   Patient Active Problem List   Diagnosis Date Noted  . Fecal impaction ([place]) 06/27/2019  . AMS (altered mental status) 05/18/2019  . Recurrent urinary tract infection 05/18/2019  . Left hip pain 06/30/2018  . Disc degeneration, lumbar 06/23/2018  . Left thyroid nodule 08/29/2017  . Chronic idiopathic constipation 08/18/2017  . Abdominal pain 07/31/2017  . Mesenteric ischemia ([place]) 05/04/2017  . Unsteadiness on feet   . Unstable angina pectoris ([place])   . Tinea pedis   . Spinal stenosis of lumbar region   . Somnolence   . Seasonal allergic rhinitis   . Restless legs syndrome   . Raynaud's syndrome   . Raynauds syndrome   . Radiculopathy   . Panic disorder   . Ovarian cyst, bilateral   . Osteopenia   . Myocardial infarction ([place])   . Muscle weakness (generalized)   . Mixed incontinence   . Mild chronic anemia   . Major depressive disorder   . Long term (current) use of aspirin   . Intermediate coronary syndrome ([place])   . Incontinent of urine   . IBS (irritable bowel syndrome)   . Hypercholesteremia   . Hx of CABG   . GERD (gastroesophageal reflux disease)   . Dementia ([place])   . Chest pain   . Atherosclerotic heart disease of native coronary artery without angina pectoris   . Arthritis   . Anxiety   . Allergic rhinitis   . Abnormal weight loss   . History of adenomatous polyp of colon 03/02/2017  . NSTEMI (non-ST elevated myocardial infarction) ([place]) 02/02/2017  . SOB (shortness of breath) 09/02/2016  . Dizziness 09/02/2016  . Non-ST elevation (NSTEMI) myocardial infarction ([place]) 11/26/2015  . Polymyalgia rheumatica ([place]) 11/26/2015  . Hypokalemia 11/26/2015  . Mild  persistent asthma 11/12/2015  . Coronary artery disease involving autologous artery coronary bypass graft with angina pectoris with documented spasm ([place]) 11/12/2015  . Multiple myeloma in remission ([place]) 11/12/2015  . Current use of beta blocker 11/12/2015  . Gastroesophageal reflux disease without esophagitis 11/12/2015  . Other allergic rhinitis 11/12/2015  . Weight loss, non-intentional 10/03/2015  . Asthma 09/24/2015  . Asthma in adult without complication 37/03/6268  . Seasonal allergic rhinitis due to pollen 09/24/2015  . Cervical radiculopathy 07/25/2015  . Chronic coronary artery disease 07/25/2015  . Coronary artery disease 07/25/2015  . Disturbance in sleep behavior 07/25/2015  . Elevated CK 07/25/2015  . Elevated creatine kinase level 07/25/2015  . Generalized osteoarthritis of hand 07/25/2015  . Other long term (current) drug therapy 07/25/2015  . Overactive bladder 07/25/2015  . Restless leg 07/25/2015  . Restless leg syndrome 07/25/2015  . Sleep disturbances 07/25/2015  . Idiopathic peripheral neuropathy 07/04/2015  . Lumbar radiculopathy, chronic 07/04/2015  . Mixed dementia ([place]) 07/04/2015  . Risk for falls 07/04/2015  . Sleep disturbance 07/04/2015  . Chest pain with moderate risk for cardiac etiology 06/13/2015  . Right sciatic nerve pain 10/19/2014  . Abnormal thyroid blood test 08/25/2014  . Chronic fatigue 08/09/2014  . Multiple myeloma ([place]) 07/21/2013  . Depression 01/14/2013  . Generalized anxiety disorder 01/14/2013  . Gastroesophageal reflux disease 01/13/2013  . Urine test positive for microalbuminuria  12/08/2011  . Cyst of ovary 12/17/2010  . KNEE PAIN, BILATERAL 08/15/2010  . Abnormal gait 08/15/2010  . Atherosclerosis of coronary artery bypass graft 10/11/2009  . CHEST PAIN, NON-CARDIAC 10/11/2009  . HIP PAIN, RIGHT 08/13/2009  . GREATER TROCHANTERIC BURSITIS 08/13/2009  . LUMBOSACRAL SPONDYLOSIS WITHOUT MYELOPATHY 11/10/2008  .  Osteoarthritis of lumbosacral spine without myelopathy 11/10/2008  . FOOT PAIN, BILATERAL 06/30/2008  . Hyperlipidemia 06/22/2008  . Hypertension 06/22/2008  . Raynaud's disease 06/22/2008  . BURSITIS, LEFT SHOULDER 06/22/2008    Current Outpatient Medications on File Prior to Visit  Medication Sig Dispense Refill  . acetaminophen (TYLENOL) 650 MG CR tablet Take 1,300 mg by mouth 2 (two) times daily.    . Alum Hydroxide-Mag Carbonate (GAVISCON EXTRA STRENGTH) 508-475 MG/10ML SUSP Take 15 mLs by mouth 4 (four) times daily as needed (for reflux).     . Artificial Tear Solution (SOOTHE XP) SOLN Place 1 drop into both eyes 2 (two) times daily.    Marland Kitchen aspirin EC 81 MG tablet Take 1 tablet (81 mg total) by mouth daily. 90 tablet 3  . baclofen 5 MG TABS Take 5 mg by mouth 3 (three) times daily as needed for muscle spasms. 30 each 0  . Calcium Carb-Cholecalciferol (CALCIUM 600+D3) 600-200 MG-UNIT TABS Take 1 tablet by mouth 2 (two) times daily.    . carvedilol (COREG) 3.125 MG tablet Take 1 tablet (3.125 mg total) by mouth 2 (two) times daily with a meal. 60 tablet 3  . cholestyramine (QUESTRAN) 4 g packet Take 4 g by mouth as directed. 1/2 pack every 2 days    . Cranberry (ELLURA) 200 MG CAPS Take 200 mg by mouth daily.    . cycloSPORINE (RESTASIS) 0.05 % ophthalmic emulsion Place 1 drop into both eyes 2 (two) times daily.     . D-Mannose POWD Take 1 Scoop by mouth daily.    Marland Kitchen docusate sodium (COLACE) 100 MG capsule Take 100 mg by mouth See admin instructions. Take 100 mg by mouth once a day and an additional 100 mg as needed for constipation    . famotidine (PEPCID) 20 MG tablet Take 1 tablet (20 mg total) by mouth 2 (two) times daily. 60 tablet 3  . furosemide (LASIX) 20 MG tablet Take 1 tablet (20 mg total) by mouth daily. 10 tablet 0  . gabapentin (NEURONTIN) 100 MG capsule Take 100 mg by mouth 2 (two) times daily.     . hydrALAZINE (APRESOLINE) 10 MG tablet Take 1 tablet (10 mg total) by mouth  daily at 3 pm. (Patient taking differently: Take 10 mg by mouth See admin instructions. Take 10 mg by mouth daily at 2:30 PM and an additional 10 mg two times a day as needed if Systolic reading is >734) 90 tablet 3  . iron polysaccharides (NU-IRON) 150 MG capsule Take 150 mg by mouth 2 (two) times daily.    . isosorbide mononitrate (IMDUR) 60 MG 24 hr tablet Take 60 mg by mouth daily.    Marland Kitchen lidocaine (LIDODERM) 5 % Place 1 patch onto the skin daily. Remove & Discard patch within 12 hours or as directed by MD 15 patch 0  . Multiple Vitamins-Minerals (CENTRUM SILVER 50+WOMEN) TABS Take 1 tablet by mouth daily.    . nitroGLYCERIN (NITROSTAT) 0.4 MG SL tablet Place 1 tablet (0.4 mg total) under the tongue every 5 (five) minutes as needed for chest pain. Reported on 11/12/2015 25 tablet 2  . polyethylene glycol (MIRALAX / GLYCOLAX) packet  Take 17 g by mouth 2 (two) times daily. (Patient taking differently: Take 17 g by mouth daily. ) 1 each 0  . prednisoLONE acetate (PRED FORTE) 1 % ophthalmic suspension Place 1 drop into both eyes 2 (two) times daily as needed (irritation).     . Propylene Glycol (SYSTANE BALANCE) 0.6 % SOLN Place 1 drop into both eyes 4 (four) times daily as needed (irritation).     . psyllium (HYDROCIL/METAMUCIL) 95 % PACK Take 1 packet by mouth 2 (two) times daily as needed for mild constipation or moderate constipation.    . sertraline (ZOLOFT) 25 MG tablet Take 25 mg by mouth daily.    . sertraline (ZOLOFT) 50 MG tablet Take 50 mg by mouth every evening.    . traMADol (ULTRAM) 50 MG tablet Take 50 mg by mouth every 8 (eight) hours as needed for moderate pain.      No current facility-administered medications on file prior to visit.    Allergies  Allergen Reactions  . Butazolidin [Phenylbutazone] Swelling and Rash  . Cephalosporins Other (See Comments)    Site of swelling not recalled  . Levaquin [Levofloxacin] Shortness Of Breath  . Morphine And Related Other (See Comments)     Hallucinations   . Trimethoprim Nausea Only  . Aleve [Naproxen] Other (See Comments)    Was told by a MD to "never take this"  . Buprenorphine Other (See Comments)    "Allergic," per MAR  . Cilostazol Other (See Comments)    Stomach upset  . Codeine Nausea And Vomiting    Tolerates Tramadol  . Dexilant [Dexlansoprazole] Diarrhea  . Diclofenac Other (See Comments)    Documented on MAR  . Erythromycin Nausea And Vomiting    All "mycin" drugs  . Lactose Intolerance (Gi) Diarrhea  . Macrodantin Other (See Comments)    Double vision Tolerates MacroBID  . Morphine Other (See Comments)    Reaction not recalled  . Simvastatin Other (See Comments)    Muscle aches   . Sulfa Antibiotics Other (See Comments)    "Allergic," per MAR  . Vesicare [Solifenacin] Other (See Comments)    Reaction not recalled by the patient- gets a lot of UTI(s)  . Clindamycin/Lincomycin Other (See Comments)    Upsets the stomach  . Doxycycline Diarrhea  . Food Other (See Comments)    No spicy foods or black pepper, raw onions - causes gerd  . Ibuprofen Hives and Rash    Was told by a MD to "never take this"  . Lincomycin Hcl Nausea Only and Other (See Comments)    Upsets the stomach  . Penicillins Rash    Has patient had a PCN reaction causing immediate rash, facial/tongue/throat swelling, SOB or lightheadedness with hypotension: Yes Has patient had a PCN reaction causing severe rash involving mucus membranes or skin necrosis: No Has patient had a PCN reaction that required hospitalization No Has patient had a PCN reaction occurring within the last 10 years: No If all of the above answers are "NO", then may proceed with Cephalosporin use.   Lisbeth Ply [Fesoterodine Fumarate Er] Other (See Comments)    Reaction not recalled  . Voltaren [Diclofenac Sodium] Rash    Objective: Physical Exam  General: Well developed, nourished, no acute distress, awake, alert and oriented x 2  Vascular: Dorsalis pedis  artery 1/4 bilateral, Posterior tibial artery 0/4 bilateral, skin temperature warm to warm proximal to distal bilateral lower extremities, mild varicosities, pedal hair present bilateral.  More increased  swelling on the left greater than the right leg chronic in nature.  Neurological: Gross sensation present via light touch bilateral.   Dermatological: Skin is warm, dry, and supple bilateral, Nails 1-10 are tender, long, thick, and discolored with mild subungal debris with minimal pain to right hallux medial margin, no open lesions present bilateral, no callus/corns/hyperkeratotic tissue present bilateral. No signs of infection bilateral.  Musculoskeletal: Asymptomatic hallux malleous L>R boney deformities noted bilateral. Muscular strength 4/5 without painon range of motion.+ History of arthritis. No pain with calf compression bilateral.  Assessment and Plan:  Problem List Items Addressed This Visit    None    Visit Diagnoses    Ingrown nail    -  Primary   Toe pain, left       Toe pain, right       Nail dystrophy       PVD (peripheral vascular disease) (HCC)       Toe pain, bilateral         -Examined patient.  -Re-Discussed treatment options for painful dystrophic nails as previous. -Mechanically debrided and reduced nails with sterile nail nipper and dremel nail file without incident -Advised patient to continue with toe caps for patient to use as instructed -May use corticosporin and epsom salt as needed for any soreness at right 1st toe -Patient to return in 2-2.5 months for nail trim or sooner if symptoms worsen.  Landis Martins, DPM

## 2019-08-02 NOTE — Telephone Encounter (Signed)
Patient's daughter, [name], is calling in regards to her mothers appointment on 08/24/19 with Jory Sims. She states that Curt Bears will need to call her on her cell phone so that she can hear everything that is going on at the appointment with her mother.   Cell phone number: [phone]

## 2019-08-03 ENCOUNTER — Telehealth: Payer: Self-pay | Admitting: *Deleted

## 2019-08-03 DIAGNOSIS — R109 Unspecified abdominal pain: Secondary | ICD-10-CM | POA: Diagnosis not present

## 2019-08-03 NOTE — Telephone Encounter (Signed)
Patient's daughter called and stated that her mom was in to see Dr Cannon Kettle yesterday and had a real good visit and the daughter was wandering if the patient could come back in to see Dr Cannon Kettle and have the right big toenail removed and the patient has an appointment next Tuesday March 2nd at 4:15 pm and called the daughter to let her know. Lattie Haw

## 2019-08-07 NOTE — Telephone Encounter (Signed)
That's fine. Will just need to make sure she has a phone available to call her daughter, if granddaughter unavailable.    KL

## 2019-08-09 ENCOUNTER — Ambulatory Visit (INDEPENDENT_AMBULATORY_CARE_PROVIDER_SITE_OTHER): Payer: PPO | Admitting: Sports Medicine

## 2019-08-09 ENCOUNTER — Encounter: Payer: Self-pay | Admitting: Sports Medicine

## 2019-08-09 ENCOUNTER — Other Ambulatory Visit: Payer: Self-pay

## 2019-08-09 VITALS — Temp 96.5°F

## 2019-08-09 DIAGNOSIS — L600 Ingrowing nail: Secondary | ICD-10-CM | POA: Diagnosis not present

## 2019-08-09 DIAGNOSIS — M79674 Pain in right toe(s): Secondary | ICD-10-CM | POA: Diagnosis not present

## 2019-08-09 DIAGNOSIS — I739 Peripheral vascular disease, unspecified: Secondary | ICD-10-CM | POA: Diagnosis not present

## 2019-08-09 NOTE — Progress Notes (Signed)
Subjective: [name] is a 86 y.o. female patient seen today in office with complaint of mildly painful right first toenail reports that she has some occasional pain to the medial aspect of the right great toe next to the skin fold and reports that her daughter wanted her to be seen to have the toenail checked again.  Patient is assisted by facility aide/caregiver.  No other issues at this time.  Patient Active Problem List   Diagnosis Date Noted  . Fecal impaction ([place]) 06/27/2019  . AMS (altered mental status) 05/18/2019  . Recurrent urinary tract infection 05/18/2019  . Left hip pain 06/30/2018  . Disc degeneration, lumbar 06/23/2018  . Left thyroid nodule 08/29/2017  . Chronic idiopathic constipation 08/18/2017  . Abdominal pain 07/31/2017  . Mesenteric ischemia ([place]) 05/04/2017  . Unsteadiness on feet   . Unstable angina pectoris ([place])   . Tinea pedis   . Spinal stenosis of lumbar region   . Somnolence   . Seasonal allergic rhinitis   . Restless legs syndrome   . Raynaud's syndrome   . Raynauds syndrome   . Radiculopathy   . Panic disorder   . Ovarian cyst, bilateral   . Osteopenia   . Myocardial infarction ([place])   . Muscle weakness (generalized)   . Mixed incontinence   . Mild chronic anemia   . Major depressive disorder   . Long term (current) use of aspirin   . Intermediate coronary syndrome ([place])   . Incontinent of urine   . IBS (irritable bowel syndrome)   . Hypercholesteremia   . Hx of CABG   . GERD (gastroesophageal reflux disease)   . Dementia ([place])   . Chest pain   . Atherosclerotic heart disease of native coronary artery without angina pectoris   . Arthritis   . Anxiety   . Allergic rhinitis   . Abnormal weight loss   . History of adenomatous polyp of colon 03/02/2017  . NSTEMI (non-ST elevated myocardial infarction) ([place]) 02/02/2017  . SOB (shortness of breath) 09/02/2016  . Dizziness 09/02/2016  . Non-ST elevation (NSTEMI) myocardial  infarction ([place]) 11/26/2015  . Polymyalgia rheumatica ([place]) 11/26/2015  . Hypokalemia 11/26/2015  . Mild persistent asthma 11/12/2015  . Coronary artery disease involving autologous artery coronary bypass graft with angina pectoris with documented spasm ([place]) 11/12/2015  . Multiple myeloma in remission ([place]) 11/12/2015  . Current use of beta blocker 11/12/2015  . Gastroesophageal reflux disease without esophagitis 11/12/2015  . Other allergic rhinitis 11/12/2015  . Weight loss, non-intentional 10/03/2015  . Asthma 09/24/2015  . Asthma in adult without complication 28/76/8115  . Seasonal allergic rhinitis due to pollen 09/24/2015  . Cervical radiculopathy 07/25/2015  . Chronic coronary artery disease 07/25/2015  . Coronary artery disease 07/25/2015  . Disturbance in sleep behavior 07/25/2015  . Elevated CK 07/25/2015  . Elevated creatine kinase level 07/25/2015  . Generalized osteoarthritis of hand 07/25/2015  . Other long term (current) drug therapy 07/25/2015  . Overactive bladder 07/25/2015  . Restless leg 07/25/2015  . Restless leg syndrome 07/25/2015  . Sleep disturbances 07/25/2015  . Idiopathic peripheral neuropathy 07/04/2015  . Lumbar radiculopathy, chronic 07/04/2015  . Mixed dementia ([place]) 07/04/2015  . Risk for falls 07/04/2015  . Sleep disturbance 07/04/2015  . Chest pain with moderate risk for cardiac etiology 06/13/2015  . Right sciatic nerve pain 10/19/2014  . Abnormal thyroid blood test 08/25/2014  . Chronic fatigue 08/09/2014  . Multiple myeloma ([place]) 07/21/2013  . Depression  01/14/2013  . Generalized anxiety disorder 01/14/2013  . Gastroesophageal reflux disease 01/13/2013  . Urine test positive for microalbuminuria 12/08/2011  . Cyst of ovary 12/17/2010  . KNEE PAIN, BILATERAL 08/15/2010  . Abnormal gait 08/15/2010  . Atherosclerosis of coronary artery bypass graft 10/11/2009  . CHEST PAIN, NON-CARDIAC 10/11/2009  . HIP PAIN, RIGHT 08/13/2009  . GREATER  TROCHANTERIC BURSITIS 08/13/2009  . LUMBOSACRAL SPONDYLOSIS WITHOUT MYELOPATHY 11/10/2008  . Osteoarthritis of lumbosacral spine without myelopathy 11/10/2008  . FOOT PAIN, BILATERAL 06/30/2008  . Hyperlipidemia 06/22/2008  . Hypertension 06/22/2008  . Raynaud's disease 06/22/2008  . BURSITIS, LEFT SHOULDER 06/22/2008    Current Outpatient Medications on File Prior to Visit  Medication Sig Dispense Refill  . acetaminophen (TYLENOL) 650 MG CR tablet Take 1,300 mg by mouth 2 (two) times daily.    . Alum Hydroxide-Mag Carbonate (GAVISCON EXTRA STRENGTH) 508-475 MG/10ML SUSP Take 15 mLs by mouth 4 (four) times daily as needed (for reflux).     . Artificial Tear Solution (SOOTHE XP) SOLN Place 1 drop into both eyes 2 (two) times daily.    Marland Kitchen aspirin EC 81 MG tablet Take 1 tablet (81 mg total) by mouth daily. 90 tablet 3  . baclofen 5 MG TABS Take 5 mg by mouth 3 (three) times daily as needed for muscle spasms. 30 each 0  . Calcium Carb-Cholecalciferol (CALCIUM 600+D3) 600-200 MG-UNIT TABS Take 1 tablet by mouth 2 (two) times daily.    . carvedilol (COREG) 3.125 MG tablet Take 1 tablet (3.125 mg total) by mouth 2 (two) times daily with a meal. 60 tablet 3  . cholestyramine (QUESTRAN) 4 g packet Take 4 g by mouth as directed. 1/2 pack every 2 days    . Cranberry (ELLURA) 200 MG CAPS Take 200 mg by mouth daily.    . cycloSPORINE (RESTASIS) 0.05 % ophthalmic emulsion Place 1 drop into both eyes 2 (two) times daily.     . D-Mannose POWD Take 1 Scoop by mouth daily.    Marland Kitchen docusate sodium (COLACE) 100 MG capsule Take 100 mg by mouth See admin instructions. Take 100 mg by mouth once a day and an additional 100 mg as needed for constipation    . famotidine (PEPCID) 20 MG tablet Take 1 tablet (20 mg total) by mouth 2 (two) times daily. 60 tablet 3  . gabapentin (NEURONTIN) 100 MG capsule Take 100 mg by mouth 2 (two) times daily.     . hydrALAZINE (APRESOLINE) 10 MG tablet Take 1 tablet (10 mg total) by  mouth daily at 3 pm. (Patient taking differently: Take 10 mg by mouth See admin instructions. Take 10 mg by mouth daily at 2:30 PM and an additional 10 mg two times a day as needed if Systolic reading is >676) 90 tablet 3  . iron polysaccharides (NU-IRON) 150 MG capsule Take 150 mg by mouth 2 (two) times daily.    . isosorbide mononitrate (IMDUR) 60 MG 24 hr tablet Take 60 mg by mouth daily.    Marland Kitchen lidocaine (LIDODERM) 5 % Place 1 patch onto the skin daily. Remove & Discard patch within 12 hours or as directed by MD 15 patch 0  . Multiple Vitamins-Minerals (CENTRUM SILVER 50+WOMEN) TABS Take 1 tablet by mouth daily.    . nitroGLYCERIN (NITROSTAT) 0.4 MG SL tablet Place 1 tablet (0.4 mg total) under the tongue every 5 (five) minutes as needed for chest pain. Reported on 11/12/2015 25 tablet 2  . polyethylene glycol (MIRALAX / GLYCOLAX)  packet Take 17 g by mouth 2 (two) times daily. (Patient taking differently: Take 17 g by mouth daily. ) 1 each 0  . prednisoLONE acetate (PRED FORTE) 1 % ophthalmic suspension Place 1 drop into both eyes 2 (two) times daily as needed (irritation).     . Propylene Glycol (SYSTANE BALANCE) 0.6 % SOLN Place 1 drop into both eyes 4 (four) times daily as needed (irritation).     . psyllium (HYDROCIL/METAMUCIL) 95 % PACK Take 1 packet by mouth 2 (two) times daily as needed for mild constipation or moderate constipation.    . sertraline (ZOLOFT) 25 MG tablet Take 25 mg by mouth daily.    . sertraline (ZOLOFT) 50 MG tablet Take 50 mg by mouth every evening.    . traMADol (ULTRAM) 50 MG tablet Take 50 mg by mouth every 8 (eight) hours as needed for moderate pain.     . furosemide (LASIX) 20 MG tablet Take 1 tablet (20 mg total) by mouth daily. 10 tablet 0   No current facility-administered medications on file prior to visit.    Allergies  Allergen Reactions  . Butazolidin [Phenylbutazone] Swelling and Rash  . Cephalosporins Other (See Comments)    Site of swelling not recalled   . Levaquin [Levofloxacin] Shortness Of Breath  . Morphine And Related Other (See Comments)    Hallucinations   . Trimethoprim Nausea Only  . Aleve [Naproxen] Other (See Comments)    Was told by a MD to "never take this"  . Buprenorphine Other (See Comments)    "Allergic," per MAR  . Cilostazol Other (See Comments)    Stomach upset  . Codeine Nausea And Vomiting    Tolerates Tramadol  . Dexilant [Dexlansoprazole] Diarrhea  . Diclofenac Other (See Comments)    Documented on MAR  . Erythromycin Nausea And Vomiting    All "mycin" drugs  . Lactose Intolerance (Gi) Diarrhea  . Macrodantin Other (See Comments)    Double vision Tolerates MacroBID  . Morphine Other (See Comments)    Reaction not recalled  . Simvastatin Other (See Comments)    Muscle aches   . Sulfa Antibiotics Other (See Comments)    "Allergic," per MAR  . Vesicare [Solifenacin] Other (See Comments)    Reaction not recalled by the patient- gets a lot of UTI(s)  . Clindamycin/Lincomycin Other (See Comments)    Upsets the stomach  . Doxycycline Diarrhea  . Food Other (See Comments)    No spicy foods or black pepper, raw onions - causes gerd  . Ibuprofen Hives and Rash    Was told by a MD to "never take this"  . Lincomycin Hcl Nausea Only and Other (See Comments)    Upsets the stomach  . Penicillins Rash    Has patient had a PCN reaction causing immediate rash, facial/tongue/throat swelling, SOB or lightheadedness with hypotension: Yes Has patient had a PCN reaction causing severe rash involving mucus membranes or skin necrosis: No Has patient had a PCN reaction that required hospitalization No Has patient had a PCN reaction occurring within the last 10 years: No If all of the above answers are "NO", then may proceed with Cephalosporin use.   Lisbeth Ply [Fesoterodine Fumarate Er] Other (See Comments)    Reaction not recalled  . Voltaren [Diclofenac Sodium] Rash    Objective: Physical Exam  General: Well  developed, nourished, no acute distress, awake, alert and oriented x 2  Patient is assisted by facility Aide/caregiver  Vascular: Dorsalis pedis artery  1/4 bilateral, Posterior tibial artery 0/4 bilateral, skin temperature warm to warm proximal to distal bilateral lower extremities, mild varicosities, pedal hair present bilateral.  Trace pitting edema bilateral  Neurological: Gross sensation present via light touch bilateral.   Dermatological: Skin is warm, dry, and supple bilateral, Nails 1-10 are short and discolored with mild subungal debris with mild ingrowing without infection at right medial hallux with no acute signs of infection, chronic in nature due to hallux malleus and pressure to the medial nail fold, no webspace macerations present bilateral, no open lesions present bilateral, no callus/corns/hyperkeratotic tissue present bilateral. No signs of infection bilateral.  Musculoskeletal: Asymptomatic hallux malleous L>R boney deformities noted bilateral. Muscular strength 4/5 without painon range of motion.+ History of arthritis. No pain with calf compression bilateral.  Assessment and Plan:  Problem List Items Addressed This Visit    None    Visit Diagnoses    Ingrown nail    -  Primary   Toe pain, right       PVD (peripheral vascular disease) ([place])         -Examined patient.  -Re-Discussed treatment options for painful ingrowning minimally mycotic nails as previous; patient's daughter requests that ingrown be removed just like we did on the left foot several months ago -Discussed treatment alternatives and plan of care; Explained permanent/temporary nail avulsion and post procedure course to patient. Patient opt for PNA right hallux medial margin - After a verbal consent, injected 3 ml of a 50:50 mixture of 2% plain  lidocaine and 0.5% plain marcaine in a normal hallux block fashion. Next, a  betadine prep was performed. Anesthesia was tested and found to be appropriate.  The  offending right hallux medial nail border was then incised from the hyponychium to the epinychium. The offending nail border was removed and cleared from the field. The area was curretted for any remaining nail or spicules. Phenol application performed and the area was then flushed with alcohol and dressed with antibiotic cream and a dry sterile dressing. -Patient was instructed to leave the dressing intact for today and begin soaking  in a weak solution of betadine and water tomorrow. Patient was instructed to  soak for 15 minutes each day and apply neosporin and a gauze or bandaid dressing each day. -Patient was instructed to monitor the toe for signs of infection and return to office if toe becomes red, hot or swollen. -Soaking instructions and paperwork completed for home nurse to help with care at [facility] -Return to office 2 weeks for nail check.  Advised aide to have daughter to call the office if she has any questions.    Landis Martins, DPM

## 2019-08-09 NOTE — Patient Instructions (Signed)

## 2019-08-10 DIAGNOSIS — R634 Abnormal weight loss: Secondary | ICD-10-CM | POA: Diagnosis not present

## 2019-08-10 DIAGNOSIS — G7249 Other inflammatory and immune myopathies, not elsewhere classified: Secondary | ICD-10-CM | POA: Diagnosis not present

## 2019-08-10 DIAGNOSIS — C9000 Multiple myeloma not having achieved remission: Secondary | ICD-10-CM | POA: Diagnosis not present

## 2019-08-14 ENCOUNTER — Emergency Department (HOSPITAL_COMMUNITY): Payer: PPO

## 2019-08-14 ENCOUNTER — Encounter (HOSPITAL_COMMUNITY): Payer: Self-pay

## 2019-08-14 ENCOUNTER — Emergency Department (HOSPITAL_COMMUNITY)
Admission: EM | Admit: 2019-08-14 | Discharge: 2019-08-14 | Disposition: A | Discharge status: Home / Self Care | Payer: PPO | Attending: Emergency Medicine | Admitting: Emergency Medicine

## 2019-08-14 ENCOUNTER — Other Ambulatory Visit: Payer: Self-pay

## 2019-08-14 DIAGNOSIS — M353 Polymyalgia rheumatica: Secondary | ICD-10-CM | POA: Diagnosis not present

## 2019-08-14 DIAGNOSIS — S0083XA Contusion of other part of head, initial encounter: Secondary | ICD-10-CM | POA: Insufficient documentation

## 2019-08-14 DIAGNOSIS — Z7401 Bed confinement status: Secondary | ICD-10-CM | POA: Diagnosis not present

## 2019-08-14 DIAGNOSIS — I10 Essential (primary) hypertension: Secondary | ICD-10-CM | POA: Diagnosis not present

## 2019-08-14 DIAGNOSIS — Y999 Unspecified external cause status: Secondary | ICD-10-CM | POA: Diagnosis not present

## 2019-08-14 DIAGNOSIS — I252 Old myocardial infarction: Secondary | ICD-10-CM | POA: Diagnosis not present

## 2019-08-14 DIAGNOSIS — R404 Transient alteration of awareness: Secondary | ICD-10-CM | POA: Diagnosis not present

## 2019-08-14 DIAGNOSIS — Z79899 Other long term (current) drug therapy: Secondary | ICD-10-CM | POA: Diagnosis not present

## 2019-08-14 DIAGNOSIS — Y92129 Unspecified place in nursing home as the place of occurrence of the external cause: Secondary | ICD-10-CM | POA: Insufficient documentation

## 2019-08-14 DIAGNOSIS — W19XXXA Unspecified fall, initial encounter: Secondary | ICD-10-CM | POA: Insufficient documentation

## 2019-08-14 DIAGNOSIS — R58 Hemorrhage, not elsewhere classified: Secondary | ICD-10-CM | POA: Diagnosis not present

## 2019-08-14 DIAGNOSIS — Y939 Activity, unspecified: Secondary | ICD-10-CM | POA: Diagnosis not present

## 2019-08-14 DIAGNOSIS — F0390 Unspecified dementia without behavioral disturbance: Secondary | ICD-10-CM | POA: Diagnosis not present

## 2019-08-14 DIAGNOSIS — M2550 Pain in unspecified joint: Secondary | ICD-10-CM | POA: Diagnosis not present

## 2019-08-14 DIAGNOSIS — S0003XA Contusion of scalp, initial encounter: Secondary | ICD-10-CM | POA: Diagnosis not present

## 2019-08-14 DIAGNOSIS — R457 State of emotional shock and stress, unspecified: Secondary | ICD-10-CM | POA: Diagnosis not present

## 2019-08-14 DIAGNOSIS — S199XXA Unspecified injury of neck, initial encounter: Secondary | ICD-10-CM | POA: Diagnosis not present

## 2019-08-14 NOTE — ED Notes (Signed)
Report Given to Wells Guiles, Therapist, sports at [facility] and [place].

## 2019-08-14 NOTE — Discharge Instructions (Signed)
Apply ice to swollen area as needed.

## 2019-08-14 NOTE — ED Provider Notes (Signed)
[facility] DEPT Provider Note   CSN: [id number] Arrival date & time: 08/14/19  0103   History Chief Complaint  Patient presents with  . Fall    Senta Kantor Leija is a 86 y.o. female.  The history is provided by the nursing home and the patient. The history is limited by the condition of the patient (Dementia).  Fall  She has history of dementia and comes in following an unwitnessed fall at the skilled nursing facility where she lives.  She is noted to have a hematoma on her occiput.  She is not on any anticoagulants.  Per nursing home report, mentation is at baseline.  Past Medical History:  Diagnosis Date  . Anxiety   . Arthritis   . CAD (coronary artery disease)   . Chondrocostal junction syndrome   . Chronic fatigue   . Dementia ([place])    without Behavioral Disturbance  . GERD (gastroesophageal reflux disease)   . Hx of CABG   . Hyperlipidemia    GOAL LDL <70  . Hypertension   . IBS (irritable bowel syndrome)   . Jaundice    yellow jaundice 3rd grade  . Major depressive disorder   . Mild chronic anemia   . Mixed incontinence   . Multiple myeloma (HCC)    not having achieved remission  . NSTEMI (non-ST elevated myocardial infarction) ([place]) 01/31/2017  . Osteopenia   . Ovarian cyst, bilateral    Rt complex  . Panic disorder   . Pulmonary nodules   . Raynaud's syndrome   . Restless legs syndrome   . Spinal stenosis of lumbar region   . Thyroid nodule     Patient Active Problem List   Diagnosis Date Noted  . Fecal impaction ([place]) 06/27/2019  . AMS (altered mental status) 05/18/2019  . Recurrent urinary tract infection 05/18/2019  . Left hip pain 06/30/2018  . Disc degeneration, lumbar 06/23/2018  . Left thyroid nodule 08/29/2017  . Chronic idiopathic constipation 08/18/2017  . Abdominal pain 07/31/2017  . Mesenteric ischemia ([place]) 05/04/2017  . Unsteadiness on feet   . Unstable angina pectoris ([place])   . Tinea pedis   .  Spinal stenosis of lumbar region   . Somnolence   . Seasonal allergic rhinitis   . Restless legs syndrome   . Raynaud's syndrome   . Raynauds syndrome   . Radiculopathy   . Panic disorder   . Ovarian cyst, bilateral   . Osteopenia   . Myocardial infarction ([place])   . Muscle weakness (generalized)   . Mixed incontinence   . Mild chronic anemia   . Major depressive disorder   . Long term (current) use of aspirin   . Intermediate coronary syndrome ([place])   . Incontinent of urine   . IBS (irritable bowel syndrome)   . Hypercholesteremia   . Hx of CABG   . GERD (gastroesophageal reflux disease)   . Dementia ([place])   . Chest pain   . Atherosclerotic heart disease of native coronary artery without angina pectoris   . Arthritis   . Anxiety   . Allergic rhinitis   . Abnormal weight loss   . History of adenomatous polyp of colon 03/02/2017  . NSTEMI (non-ST elevated myocardial infarction) ([place]) 02/02/2017  . SOB (shortness of breath) 09/02/2016  . Dizziness 09/02/2016  . Non-ST elevation (NSTEMI) myocardial infarction ([place]) 11/26/2015  . Polymyalgia rheumatica ([place]) 11/26/2015  . Hypokalemia 11/26/2015  . Mild persistent asthma 11/12/2015  . Coronary artery  disease involving autologous artery coronary bypass graft with angina pectoris with documented spasm ([place]) 11/12/2015  . Multiple myeloma in remission ([place]) 11/12/2015  . Current use of beta blocker 11/12/2015  . Gastroesophageal reflux disease without esophagitis 11/12/2015  . Other allergic rhinitis 11/12/2015  . Weight loss, non-intentional 10/03/2015  . Asthma 09/24/2015  . Asthma in adult without complication 82/42/3536  . Seasonal allergic rhinitis due to pollen 09/24/2015  . Cervical radiculopathy 07/25/2015  . Chronic coronary artery disease 07/25/2015  . Coronary artery disease 07/25/2015  . Disturbance in sleep behavior 07/25/2015  . Elevated CK 07/25/2015  . Elevated creatine kinase level 07/25/2015  . Generalized  osteoarthritis of hand 07/25/2015  . Other long term (current) drug therapy 07/25/2015  . Overactive bladder 07/25/2015  . Restless leg 07/25/2015  . Restless leg syndrome 07/25/2015  . Sleep disturbances 07/25/2015  . Idiopathic peripheral neuropathy 07/04/2015  . Lumbar radiculopathy, chronic 07/04/2015  . Mixed dementia ([place]) 07/04/2015  . Risk for falls 07/04/2015  . Sleep disturbance 07/04/2015  . Chest pain with moderate risk for cardiac etiology 06/13/2015  . Right sciatic nerve pain 10/19/2014  . Abnormal thyroid blood test 08/25/2014  . Chronic fatigue 08/09/2014  . Multiple myeloma ([place]) 07/21/2013  . Depression 01/14/2013  . Generalized anxiety disorder 01/14/2013  . Gastroesophageal reflux disease 01/13/2013  . Urine test positive for microalbuminuria 12/08/2011  . Cyst of ovary 12/17/2010  . KNEE PAIN, BILATERAL 08/15/2010  . Abnormal gait 08/15/2010  . Atherosclerosis of coronary artery bypass graft 10/11/2009  . CHEST PAIN, NON-CARDIAC 10/11/2009  . HIP PAIN, RIGHT 08/13/2009  . GREATER TROCHANTERIC BURSITIS 08/13/2009  . LUMBOSACRAL SPONDYLOSIS WITHOUT MYELOPATHY 11/10/2008  . Osteoarthritis of lumbosacral spine without myelopathy 11/10/2008  . FOOT PAIN, BILATERAL 06/30/2008  . Hyperlipidemia 06/22/2008  . Hypertension 06/22/2008  . Raynaud's disease 06/22/2008  . BURSITIS, LEFT SHOULDER 06/22/2008    Past Surgical History:  Procedure Laterality Date  . BUNIONECTOMY WITH HAMMERTOE RECONSTRUCTION Right 08/04/2012   Procedure:  HAMMERTOE RECONSTRUCTION;  Surgeon: Colin Rhein, MD;  Location: [facility];  Service: Orthopedics;  Laterality: Right;  HAMMER TOE RECONSTRUCTION PROBABLE FLEXOR DIGITORUM LONGUS TO PROXIMAL PHALANX TRANSFER LATERALIZED   . CAPSULOTOMY Right 08/04/2012   Procedure: CAPSULOTOMY;  Surgeon: Colin Rhein, MD;  Location: [facility];  Service: Orthopedics;  Laterality: Right;  RIGHT 2ND TOE METATARSOPHALANGEAL  JOINT DORSAL CAPSULOTOMY   . CATARACT EXTRACTION, BILATERAL     both cataracts  . COLONOSCOPY    . CORONARY ANGIOPLASTY WITH STENT PLACEMENT  2014   2 stents  . CORONARY ARTERY BYPASS GRAFT  2007   LIMA to LAD, SVG to RCA  non-ST segment MI 08/2009 (Dr. Tamala Julian)  . CORONARY STENT INTERVENTION N/A 02/02/2017   Procedure: CORONARY STENT INTERVENTION;  Surgeon: Belva Crome, MD;  Location: [facility];  Service: Cardiovascular;  Laterality: N/A;  Prox CFX  . DILATION AND CURETTAGE OF UTERUS    . LEFT HEART CATH AND CORS/GRAFTS ANGIOGRAPHY N/A 02/02/2017   Procedure: LEFT HEART CATH AND CORS/GRAFTS ANGIOGRAPHY;  Surgeon: Belva Crome, MD;  Location: [facility];  Service: Cardiovascular;  Laterality: N/A;  . LEFT HEART CATHETERIZATION WITH CORONARY/GRAFT ANGIOGRAM N/A 07/09/2012   Procedure: LEFT HEART CATHETERIZATION WITH Beatrix Fetters;  Surgeon: Sinclair Grooms, MD;  Location: [facility] CATH LAB;  Service: Cardiovascular;  Laterality: N/A;  . TONSILLECTOMY AND ADENOIDECTOMY    . UPPER GASTROINTESTINAL ENDOSCOPY    . VISCERAL  ANGIOGRAPHY N/A 04/15/2017   Procedure: VISCERAL ANGIOGRAPHY;  Surgeon: Waynetta Sandy, MD;  Location: [facility] CV LAB;  Service: Cardiovascular;  Laterality: N/A;     OB History   No obstetric history on file.     Family History  Problem Relation Age of Onset  . Heart attack Mother   . CAD Mother   . Obesity Sister   . Breast cancer Paternal Aunt   . Ulcers Father   . Asthma Neg Hx   . Eczema Neg Hx   . Urticaria Neg Hx   . Allergic rhinitis Neg Hx   . Angioedema Neg Hx   . Atopy Neg Hx   . Immunodeficiency Neg Hx     Social History   Tobacco Use  . Smoking status: Never Smoker  . Smokeless tobacco: Never Used  Substance Use Topics  . Alcohol use: Never  . Drug use: No    Home Medications Prior to Admission medications   Medication Sig Start Date End Date Taking? Authorizing Provider  acetaminophen (TYLENOL) 650  MG CR tablet Take 1,300 mg by mouth 2 (two) times daily.    [provider]  Alum Hydroxide-Mag Carbonate (GAVISCON EXTRA STRENGTH) [phone] MG/10ML SUSP Take 15 mLs by mouth 4 (four) times daily as needed (for reflux).     [provider]  Artificial Tear Solution (SOOTHE XP) SOLN Place 1 drop into both eyes 2 (two) times daily.    [provider]  aspirin EC 81 MG tablet Take 1 tablet (81 mg total) by mouth daily. 01/21/16   Minus Breeding, MD  baclofen 5 MG TABS Take 5 mg by mouth 3 (three) times daily as needed for muscle spasms. 07/02/18   Debbe Odea, MD  Calcium Carb-Cholecalciferol (CALCIUM 600+D3) 600-200 MG-UNIT TABS Take 1 tablet by mouth 2 (two) times daily.    [provider]  carvedilol (COREG) 3.125 MG tablet Take 1 tablet (3.125 mg total) by mouth 2 (two) times daily with a meal. 01/05/18   Minus Breeding, MD  cholestyramine (QUESTRAN) 4 g packet Take 4 g by mouth as directed. 1/2 pack every 2 days    [provider]  Cranberry (ELLURA) 200 MG CAPS Take 200 mg by mouth daily.    [provider]  cycloSPORINE (RESTASIS) 0.05 % ophthalmic emulsion Place 1 drop into both eyes 2 (two) times daily.     [provider]  D-Mannose POWD Take 1 Scoop by mouth daily.    [provider]  docusate sodium (COLACE) 100 MG capsule Take 100 mg by mouth See admin instructions. Take 100 mg by mouth once a day and an additional 100 mg as needed for constipation    [provider]  famotidine (PEPCID) 20 MG tablet Take 1 tablet (20 mg total) by mouth 2 (two) times daily. 09/24/18   Pyrtle, Lajuan Lines, MD  furosemide (LASIX) 20 MG tablet Take 1 tablet (20 mg total) by mouth daily. 04/29/19 07/28/19  Lendon Colonel, NP  gabapentin (NEURONTIN) 100 MG capsule Take 100 mg by mouth 2 (two) times daily.     [provider]  hydrALAZINE (APRESOLINE) 10 MG tablet Take 1 tablet (10 mg total) by mouth daily at 3 pm. Patient  taking differently: Take 10 mg by mouth See admin instructions. Take 10 mg by mouth daily at 2:30 PM and an additional 10 mg two times a day as needed if Systolic reading is >371 69/67/89   Almyra Deforest, PA  iron  polysaccharides (NU-IRON) 150 MG capsule Take 150 mg by mouth 2 (two) times daily.    [provider]  isosorbide mononitrate (IMDUR) 60 MG 24 hr tablet Take 60 mg by mouth daily.    [provider]  lidocaine (LIDODERM) 5 % Place 1 patch onto the skin daily. Remove & Discard patch within 12 hours or as directed by MD 05/19/19   Domenic Polite, MD  Multiple Vitamins-Minerals (CENTRUM SILVER 50+WOMEN) TABS Take 1 tablet by mouth daily.    [provider]  nitroGLYCERIN (NITROSTAT) 0.4 MG SL tablet Place 1 tablet (0.4 mg total) under the tongue every 5 (five) minutes as needed for chest pain. Reported on 11/12/2015 02/03/17   Lyda Jester M, PA-C  polyethylene glycol (MIRALAX / GLYCOLAX) packet Take 17 g by mouth 2 (two) times daily. Patient taking differently: Take 17 g by mouth daily.  02/23/18   Pyrtle, Lajuan Lines, MD  prednisoLONE acetate (PRED FORTE) 1 % ophthalmic suspension Place 1 drop into both eyes 2 (two) times daily as needed (irritation).     [provider]  Propylene Glycol (SYSTANE BALANCE) 0.6 % SOLN Place 1 drop into both eyes 4 (four) times daily as needed (irritation).     [provider]  psyllium (HYDROCIL/METAMUCIL) 95 % PACK Take 1 packet by mouth 2 (two) times daily as needed for mild constipation or moderate constipation.    [provider]  sertraline (ZOLOFT) 25 MG tablet Take 25 mg by mouth daily.    [provider]  sertraline (ZOLOFT) 50 MG tablet Take 50 mg by mouth every evening.    [provider]  traMADol (ULTRAM) 50 MG tablet Take 50 mg by mouth every 8 (eight) hours as needed for moderate pain.     [provider]    Allergies    Butazolidin [phenylbutazone], Cephalosporins,  Levaquin [levofloxacin], Morphine and related, Trimethoprim, Aleve [naproxen], Buprenorphine, Cilostazol, Codeine, Dexilant [dexlansoprazole], Diclofenac, Erythromycin, Lactose intolerance (gi), Macrodantin, Morphine, Simvastatin, Sulfa antibiotics, Vesicare [solifenacin], Clindamycin/lincomycin, Doxycycline, Food, Ibuprofen, Lincomycin hcl, Penicillins, Toviaz [fesoterodine fumarate er], and Voltaren [diclofenac sodium]  Review of Systems   Review of Systems  Unable to perform ROS: Dementia    Physical Exam Updated Vital Signs BP (!) 198/85   Pulse 84   Temp 97.6 F (36.4 C) (Oral)   Resp 13   SpO2 95%   Physical Exam Vitals and nursing note reviewed.   86 year old female, resting comfortably and in no acute distress. Vital signs are significant for elevated blood pressure. Oxygen saturation is 95%, which is normal. Head is normocephalic.  Hematoma present on occiput.Marland Kitchen PERRLA, EOMI. Oropharynx is clear. Neck is immobilized in a stiff cervical collar and is nontender without adenopathy or JVD. Back is nontender and there is no CVA tenderness. Lungs are clear without rales, wheezes, or rhonchi. Chest is nontender. Heart has regular rate and rhythm without murmur. Abdomen is soft, flat, nontender without masses or hepatosplenomegaly and peristalsis is normoactive. Extremities have no cyanosis or edema, full range of motion is present. Skin is warm and dry without rash. Neurologic: Awake and oriented to person and place but not time, cranial nerves are intact, there are no motor or sensory deficits.  ED Results / Procedures / Treatments    Radiology CT Head Wo Contrast  Result Date: 08/14/2019 CLINICAL DATA:  86 year old dementia patient post unwitnessed fall. Struck back of head. Hematoma. Head trauma, minor (Age >= 65y) EXAM: CT HEAD WITHOUT CONTRAST TECHNIQUE: Contiguous axial images  were obtained from the base of the skull through the vertex without intravenous contrast.  COMPARISON:  Head CT 05/18/2019 FINDINGS: Brain: No intracranial hemorrhage, mass effect, or midline shift. Stable generalized atrophy and chronic small vessel ischemia. No hydrocephalus. The basilar cisterns are patent. Stable bifrontal encephalomalacia, right greater than left. No evidence of territorial infarct or acute ischemia. No extra-axial or intracranial fluid collection. Vascular: Atherosclerosis of skullbase vasculature without hyperdense vessel or abnormal calcification. Skull: No fracture or focal lesion. Sinuses/Orbits: Paranasal sinuses and mastoid air cells are clear. The visualized orbits are unremarkable. Bilateral cataract resection. Other: Small posterior scalp hematoma. IMPRESSION: 1. Small posterior scalp hematoma. No acute intracranial abnormality. No skull fracture. 2. Stable atrophy and chronic small vessel ischemia. Stable bifrontal encephalomalacia. Electronically Signed   By: Keith Rake M.D.   On: 08/14/2019 02:58   CT Cervical Spine Wo Contrast  Result Date: 08/14/2019 CLINICAL DATA:  86 year old dementia patient post unwitnessed fall. Struck back of head. Hematoma. Head trauma, minor (Age >= 65y) EXAM: CT CERVICAL SPINE WITHOUT CONTRAST TECHNIQUE: Multidetector CT imaging of the cervical spine was performed without intravenous contrast. Multiplanar CT image reconstructions were also generated. COMPARISON:  Cervical spine CT 04/28/2018 FINDINGS: Alignment: No traumatic subluxation. Stable exaggerated cervical lordosis and lower cervical scoliosis. Skull base and vertebrae: No acute fracture. Vertebral body heights are maintained. The dens and skull base are intact. Soft tissues and spinal canal: No prevertebral fluid or swelling. No visible canal hematoma. Disc levels: Multilevel degenerative disc disease and facet hypertrophy, not significantly changed from prior. Upper chest: No acute findings. Hypodense left thyroid nodule, of doubtful clinical significance in a patient of  this age. No further evaluation recommended in less clinically warranted. Other: Carotid calcifications. IMPRESSION: Stable degenerative change in the cervical spine without acute fracture or subluxation. Electronically Signed   By: Keith Rake M.D.   On: 08/14/2019 03:00   Procedures Procedures  Medications Ordered in ED Medications - No data to display  ED Course  I have reviewed the triage vital signs and the nursing notes.  Pertinent imaging results that were available during my care of the patient were reviewed by me and considered in my medical decision making (see chart for details).  MDM Rules/Calculators/A&P Unwitnessed fall with occipital hematoma.  She will be sent for CT of head and cervical spine.  Old records are reviewed, and she does have a prior ED visit for a fall.  CT scans show no fracture and no intracranial injury.  She is discharged back to her skilled nursing facility with instructions to apply ice to her hematoma.  Final Clinical Impression(s) / ED Diagnoses Final diagnoses:  Fall at nursing home, initial encounter  Hematoma of occipital surface of head, initial encounter  Elevated blood pressure reading with diagnosis of hypertension    Rx / DC Orders ED Discharge Orders    None       Delora Fuel, MD 43/83/77 [phone]

## 2019-08-14 NOTE — ED Triage Notes (Addendum)
BIB EMS from [facility] at [place]. Facility reports unwitnessed fall from standing hitting back on head. Golf ball size hematoma noted bleeding controlled. No none blood thinners. Pt has hx of dementia reported at baseline per staff. Hypertensive with EMS. Ambulatory at baseline and after fall. Pt denies any pain at this time. DNR at bedside.  190/95 90 18

## 2019-08-14 NOTE — ED Notes (Signed)
[facility] called paperwork printed.  

## 2019-08-17 DIAGNOSIS — R5382 Chronic fatigue, unspecified: Secondary | ICD-10-CM | POA: Diagnosis not present

## 2019-08-17 DIAGNOSIS — Z9181 History of falling: Secondary | ICD-10-CM | POA: Diagnosis not present

## 2019-08-17 DIAGNOSIS — F0390 Unspecified dementia without behavioral disturbance: Secondary | ICD-10-CM | POA: Diagnosis not present

## 2019-08-22 NOTE — Progress Notes (Signed)
Cardiology Office Note   Date:  08/24/2019   ID:  [name], DOB 10/08/1931, MRN [id number]  PCP:  [name], [name]  Cardiologist:  [name]  CC: Follow U    History of Present Illness: [name] is a 86 y.o. female who presents for ongoing assessment and management of CAD with known history of PCI of a Cx with 90% stenosis,01/2017, CABG LIMA to LAD, SVG to RCA 08/2009, labile hypertension, non-cardiac chest pain, abdominal pain and significant anxiety.[facility] is [facility] at [name]. She really does not get much activity and walks with a walker a little bit in the hallway and in her room.    Last seen in the office on 04/29/2019 at which time she was stable from a cardiac standpoint. She did have some dependent edema and was given a 3 day supply of lasix, but then to only use prn. She had follow up labs which were normal.  She was seen in the  ED on 08/14/2019 after falling at nursing facility. She sustained a hematoma on occipital surface of her head. She was sent home when she was found that she did not have a hematoma or CVA.  She comes today with skilled nursing facility staff member, as well as daughter, [name], who is on the cell phone during this visit.  She continues to be quite confused and is now been transferred to the "memory care" portion of the facility.  They bring with her today her blood pressure trends which have been running up during the noon time hours into higher levels of around 188/79 170/89 200/89.  She does have a good bit of anxiety associated with her dementia.  She cannot tell me nor can the skilled nursing facility tell me how she fell in the past.  It is noted that she does not always remember to use her walker for ambulation.  Past Medical History:  Diagnosis Date  . Anxiety   . Arthritis   . CAD (coronary artery disease)   . Chondrocostal junction syndrome   . Chronic fatigue   . Dementia ([place])    without Behavioral Disturbance  .  GERD (gastroesophageal reflux disease)   . Hx of CABG   . Hyperlipidemia    GOAL LDL <70  . Hypertension   . IBS (irritable bowel syndrome)   . Jaundice    yellow jaundice 3rd grade  . Major depressive disorder   . Mild chronic anemia   . Mixed incontinence   . Multiple myeloma (H[facility])    not having achieved remission  . NSTEMI (non-ST elevated myocardial infarction) ([place]) 01/31/2017  . Osteopenia   . Ovarian cyst, bilateral    Rt complex  . Panic disorder   . Pulmonary nodules   . Raynaud's syndrome   . Restless legs syndrome   . Spinal stenosis of lumbar region   . Thyroid nodule     Past Surgical History:  Procedure Laterality Date  . BUNIONECTOMY WITH HAMMERTOE RECONSTRUCTION Right 08/04/2012   Procedure:  HAMMERTOE RECONSTRUCTION;  Surgeon: Colin Rhein, MD;  Location: [facility];  Service: Orthopedics;  Laterality: Right;  HAMMER TOE RECONSTRUCTION PROBABLE FLEXOR DIGITORUM LONGUS TO PROXIMAL PHALANX TRANSFER LATERALIZED   . CAPSULOTOMY Right 08/04/2012   Procedure: CAPSULOTOMY;  Surgeon: Colin Rhein, MD;  Location: [facility];  Service: Orthopedics;  Laterality: Right;  RIGHT 2ND TOE METATARSOPHALANGEAL JOINT DORSAL CAPSULOTOMY   . CATARACT EXTRACTION, BILATERAL  both cataracts  . COLONOSCOPY    . CORONARY ANGIOPLASTY WITH STENT PLACEMENT  2014   2 stents  . CORONARY ARTERY BYPASS GRAFT  2007   LIMA to LAD, SVG to RCA  non-ST segment MI 08/2009 (Dr. Tamala Julian)  . CORONARY STENT INTERVENTION N/A 02/02/2017   Procedure: CORONARY STENT INTERVENTION;  Surgeon: Belva Crome, MD;  Location: [facility];  Service: Cardiovascular;  Laterality: N/A;  Prox CFX  . DILATION AND CURETTAGE OF UTERUS    . LEFT HEART CATH AND CORS/GRAFTS ANGIOGRAPHY N/A 02/02/2017   Procedure: LEFT HEART CATH AND CORS/GRAFTS ANGIOGRAPHY;  Surgeon: Belva Crome, MD;  Location: [facility];  Service: Cardiovascular;  Laterality: N/A;  . LEFT HEART  CATHETERIZATION WITH CORONARY/GRAFT ANGIOGRAM N/A 07/09/2012   Procedure: LEFT HEART CATHETERIZATION WITH Beatrix Fetters;  Surgeon: Sinclair Grooms, MD;  Location: [facility];  Service: Cardiovascular;  Laterality: N/A;  . TONSILLECTOMY AND ADENOIDECTOMY    . UPPER GASTROINTESTINAL ENDOSCOPY    . VISCERAL ANGIOGRAPHY N/A 04/15/2017   Procedure: VISCERAL ANGIOGRAPHY;  Surgeon: Waynetta Sandy, MD;  Location: [facility];  Service: Cardiovascular;  Laterality: N/A;     Current Outpatient Medications  Medication Sig Dispense Refill  . acetaminophen (TYLENOL) 650 MG CR tablet Take 1,300 mg by mouth 2 (two) times daily.    . Alum Hydroxide-Mag Carbonate (GAVISCON EXTRA STRENGTH) 508-475 MG/10ML SUSP Take 15 mLs by mouth 4 (four) times daily as needed (for reflux).     . Artificial Tear Solution (SOOTHE XP) SOLN Place 1 drop into both eyes 2 (two) times daily.    Marland Kitchen aspirin EC 81 MG tablet Take 1 tablet (81 mg total) by mouth daily. 90 tablet 3  . Calcium Carb-Cholecalciferol (CALCIUM 600+D3) 600-200 MG-UNIT TABS Take 1 tablet by mouth 2 (two) times daily.    . carvedilol (COREG) 3.125 MG tablet Take 1 tablet (3.125 mg total) by mouth 2 (two) times daily with a meal. 60 tablet 3  . cholestyramine (QUESTRAN) 4 g packet Take 4 g by mouth as directed. 1/2 pack every 2 days    . Cranberry (ELLURA) 200 MG CAPS Take 200 mg by mouth daily.    . cycloSPORINE (RESTASIS) 0.05 % ophthalmic emulsion Place 1 drop into both eyes 2 (two) times daily.     . D-Mannose POWD Take 1 Scoop by mouth daily.    Marland Kitchen docusate sodium (COLACE) 100 MG capsule Take 100 mg by mouth See admin instructions. Take 100 mg by mouth once a day and an additional 100 mg as needed for constipation    . famotidine (PEPCID) 20 MG tablet Take 1 tablet (20 mg total) by mouth 2 (two) times daily. 60 tablet 3  . famotidine (PEPCID) 40 MG tablet Take 40 mg by mouth 2 (two) times daily.    Marland Kitchen gabapentin (NEURONTIN) 100 MG  capsule Take 100 mg by mouth 2 (two) times daily.     . hydrALAZINE (APRESOLINE) 10 MG tablet Take 1 tablet (10 mg total) by mouth daily at 3 pm. (Patient taking differently: Take 10 mg by mouth See admin instructions. Take 10 mg by mouth daily at 2:30 PM and an additional 10 mg two times a day as needed if Systolic reading is >413) 90 tablet 3  . hydrALAZINE (APRESOLINE) 25 MG tablet Take 25 mg by mouth 2 (two) times daily.    . iron polysaccharides (NU-IRON) 150 MG capsule Take 150 mg by mouth 2 (  two) times daily.    . isosorbide mononitrate (IMDUR) 60 MG 24 hr tablet Take 60 mg by mouth daily.    Marland Kitchen levocetirizine (XYZAL) 5 MG tablet Take 5 mg by mouth daily.    Marland Kitchen lidocaine (LIDODERM) 5 % Place 1 patch onto the skin daily. Remove & Discard patch within 12 hours or as directed by MD 15 patch 0  . LORazepam (ATIVAN) 0.5 MG tablet Take 0.5 mg by mouth at bedtime.    . memantine (NAMENDA) 10 MG tablet Take 10 mg by mouth 2 (two) times daily.    . montelukast (SINGULAIR) 10 MG tablet Take 10 mg by mouth daily.    . Multiple Vitamins-Minerals (CENTRUM SILVER 50+WOMEN) TABS Take 1 tablet by mouth daily.    Marland Kitchen MYRBETRIQ 25 MG TB24 tablet Take 25 mg by mouth daily.    . nitroGLYCERIN (NITROSTAT) 0.4 MG SL tablet Place 1 tablet (0.4 mg total) under the tongue every 5 (five) minutes as needed for chest pain. Reported on 11/12/2015 25 tablet 2  . polyethylene glycol (MIRALAX / GLYCOLAX) packet Take 17 g by mouth 2 (two) times daily. (Patient taking differently: Take 17 g by mouth daily. ) 1 each 0  . prednisoLONE acetate (PRED FORTE) 1 % ophthalmic suspension Place 1 drop into both eyes 2 (two) times daily as needed (irritation).     . Probiotic Product (RISA-BID PROBIOTIC) TABS Take 1 tablet by mouth 2 (two) times daily.    Marland Kitchen Propylene Glycol (SYSTANE BALANCE) 0.6 % SOLN Place 1 drop into both eyes 4 (four) times daily as needed (irritation).     . sertraline (ZOLOFT) 25 MG tablet Take 25 mg by mouth as  directed.    . sertraline (ZOLOFT) 50 MG tablet Take 50 mg by mouth every evening.     No current facility-administered medications for this visit.    Allergies:   Butazolidin [phenylbutazone], Cephalosporins, Levaquin [levofloxacin], Morphine and related, Trimethoprim, Aleve [naproxen], Buprenorphine, Cilostazol, Codeine, Dexilant [dexlansoprazole], Diclofenac, Erythromycin, Lactose intolerance (gi), Macrodantin, Morphine, Simvastatin, Sulfa antibiotics, Vesicare [solifenacin], Clindamycin/lincomycin, Doxycycline, Food, Ibuprofen, Lincomycin hcl, Penicillins, Toviaz [fesoterodine fumarate er], and Voltaren [diclofenac sodium]    Social History:  The patient  reports that she has never smoked. She has never used smokeless tobacco. She reports that she does not drink alcohol or use drugs.   Family History:  The patient's family history includes Breast cancer in her paternal aunt; CAD in her mother; Heart attack in her mother; Obesity in her sister; Ulcers in her father.    ROS: All other systems are reviewed and negative. Unless otherwise mentioned in H&P    PHYSICAL EXAM: VS:  BP 130/62   Pulse 80   Ht '4\' 11"'  (1.499 m)   Wt 93 lb 6.4 oz (42.4 kg)   SpO2 91%   BMI 18.86 kg/m  , BMI Body mass index is 18.86 kg/m. GEN: Well nourished, well developed, in no acute distress, frail HEENT: normal Neck: no JVD, carotid bruits, or masses Cardiac: RRR; no murmurs, rubs, or gallops,no edema  Respiratory:  Clear to auscultation bilaterally, normal work of breathing GI: soft, nontender, nondistended, + BS MS: Kyphosis with scoliosis.: Skin: warm and dry, no rash Neuro:  Strength and sensation are diminished. Psych: euthymic mood, full affect, dementia with flight of ideas is noted during our conversation.   EKG: Not completed during this office visit.  Recent Labs: 06/26/2019: ALT 18; BUN 23; Creatinine, Ser 0.74; Hemoglobin 9.2; Platelets 474; Potassium 3.5; Sodium 135  Lipid Panel     Component Value Date/Time   CHOL 152 02/01/2017 0919   TRIG 41 02/01/2017 0919   HDL 75 02/01/2017 0919   CHOLHDL 2.0 02/01/2017 0919   VLDL 8 02/01/2017 0919   LDLCALC 69 02/01/2017 0919      Wt Readings from Last 3 Encounters:  08/24/19 93 lb 6.4 oz (42.4 kg)  06/26/19 93 lb (42.2 kg)  05/20/19 101 lb 6.6 oz (46 kg)      Other studies Reviewed:  Echocardiogram 08/30/2017, Left ventricle: The cavity size was normal. Wall thickness was  normal. The estimated ejection fraction was 55%. Wall motion was  normal; there were no regional wall motion abnormalities. The  study is not technically sufficient to allow evaluation of LV  diastolic function.  - Aortic valve: Mildly calcified annulus. There was mild  regurgitation.  - Mitral valve: Mildly calcified annulus. There was trivial  regurgitation.  - Right atrium: Central venous pressure (est): 3 mm Hg.  - Atrial septum: No defect or patent foramen ovale was identified.  - Tricuspid valve: There was mild regurgitation.  - Pulmonary arteries: PA peak pressure: 21 mm Hg (S).  - Pericardium, extracardiac: There was no pericardial effusion.    ASSESSMENT AND PLAN:  1.  Hypertension: Review of her blood pressure trends revealed that she has labile blood pressures.  They range from 118/65, 104/57, to as high as 200/89.  She is on hydralazine 25 mg every 12 hours with an additional hydralazine 10 mg given at 3 PM.  I have advised that the 10 mg dose of hydralazine be given at lunchtime around 1 PM instead of later in the afternoon.  We may have to allow for permissive hypertension to avoid significant blood pressure drops but just adjust timing of medications a little better.  I will continue hydralazine, carvedilol, and isosorbide mononitrate.  If any medications are to be adjusted would consider decreasing isosorbide to avoid positional hypotension.  Due to her frailty and musculoskeletal deformity I did not perform  orthostatic blood pressures in the office today.  I would also like to get lab work completed to evaluate for anemia and dehydration.  She apparently did have some lab work recently but the facility worker who is with her does not remember when this was completed.  I have written an order for a BMET and a CBC to be drawn unless they can send this her recent labs within the last month.  2.  Frequent falls: She is unsteady on her feet and does use a walker but due to dementia does not always remember to use it.  She has a good bit of anxiety as well and has been given occasional antianxiety medications, lorazepam 0.5 mg.  They are uncertain if taking this medication does contribute to her falling as it is not documented when she receives it concerning episodes of instability on her feet.  I also noticed that she takes tramadol as needed for pain.  Her daughter states that she does not always get that medication if she does it is on rare occasions.  She is unable to tell me if she is having syncope or dizziness associated with her falls as sometimes they are not witnessed.  3.  Coronary artery disease: Continue medical management.  Due to age and comorbidities will not plan any ischemic testing at this time.  Current medicines are reviewed at length with the patient today.  I have spent 25 minutes dedicated to the care  of this patient on the date of this encounter to include pre-visit review of records, assessment, management and diagnostic testing,with shared decision making.  Labs/ tests ordered today include: BMET, CBC.  Labile hypertension Phill Myron. [place] Pugh, ANP, AACC   08/24/2019 3:54 PM    [facility] [place] [address] Office [phone] Fax [phone]  Notice: This dictation was prepared with Dragon dictation along with smaller phrase technology. Any transcriptional errors that result from this process are unintentional and may not be corrected upon  review.

## 2019-08-23 ENCOUNTER — Encounter: Payer: Self-pay | Admitting: Sports Medicine

## 2019-08-23 ENCOUNTER — Other Ambulatory Visit: Payer: Self-pay

## 2019-08-23 ENCOUNTER — Ambulatory Visit (INDEPENDENT_AMBULATORY_CARE_PROVIDER_SITE_OTHER): Payer: PPO | Admitting: Sports Medicine

## 2019-08-23 DIAGNOSIS — Z9889 Other specified postprocedural states: Secondary | ICD-10-CM

## 2019-08-23 DIAGNOSIS — I739 Peripheral vascular disease, unspecified: Secondary | ICD-10-CM

## 2019-08-23 DIAGNOSIS — M79674 Pain in right toe(s): Secondary | ICD-10-CM

## 2019-08-23 NOTE — Progress Notes (Signed)
Subjective: [name] is a 86 y.o. female patient returns to office today for follow up evaluation after having Right Hallux medial permanent nail avulsion performed on (08-09-19). Patient has been soaking using epsom salt and applying topical antibiotic covered with bandaid daily. Patient denies fever/chills/nausea/vomitting/any other related constitutional symptoms at this time. Admits swelling sometimes in legs/feet and is unsure if compression stockings are helping.  Patient Active Problem List   Diagnosis Date Noted  . Fecal impaction ([place]) 06/27/2019  . AMS (altered mental status) 05/18/2019  . Recurrent urinary tract infection 05/18/2019  . Left hip pain 06/30/2018  . Disc degeneration, lumbar 06/23/2018  . Left thyroid nodule 08/29/2017  . Chronic idiopathic constipation 08/18/2017  . Abdominal pain 07/31/2017  . Mesenteric ischemia ([place]) 05/04/2017  . Unsteadiness on feet   . Unstable angina pectoris ([place])   . Tinea pedis   . Spinal stenosis of lumbar region   . Somnolence   . Seasonal allergic rhinitis   . Restless legs syndrome   . Raynaud's syndrome   . Raynauds syndrome   . Radiculopathy   . Panic disorder   . Ovarian cyst, bilateral   . Osteopenia   . Myocardial infarction ([place])   . Muscle weakness (generalized)   . Mixed incontinence   . Mild chronic anemia   . Major depressive disorder   . Long term (current) use of aspirin   . Intermediate coronary syndrome ([place])   . Incontinent of urine   . IBS (irritable bowel syndrome)   . Hypercholesteremia   . Hx of CABG   . GERD (gastroesophageal reflux disease)   . Dementia ([place])   . Chest pain   . Atherosclerotic heart disease of native coronary artery without angina pectoris   . Arthritis   . Anxiety   . Allergic rhinitis   . Abnormal weight loss   . History of adenomatous polyp of colon 03/02/2017  . NSTEMI (non-ST elevated myocardial infarction) ([place]) 02/02/2017  . SOB (shortness of breath) 09/02/2016   . Dizziness 09/02/2016  . Non-ST elevation (NSTEMI) myocardial infarction ([place]) 11/26/2015  . Polymyalgia rheumatica ([place]) 11/26/2015  . Hypokalemia 11/26/2015  . Mild persistent asthma 11/12/2015  . Coronary artery disease involving autologous artery coronary bypass graft with angina pectoris with documented spasm ([place]) 11/12/2015  . Multiple myeloma in remission ([place]) 11/12/2015  . Current use of beta blocker 11/12/2015  . Gastroesophageal reflux disease without esophagitis 11/12/2015  . Other allergic rhinitis 11/12/2015  . Weight loss, non-intentional 10/03/2015  . Asthma 09/24/2015  . Asthma in adult without complication 58/02/9832  . Seasonal allergic rhinitis due to pollen 09/24/2015  . Cervical radiculopathy 07/25/2015  . Chronic coronary artery disease 07/25/2015  . Coronary artery disease 07/25/2015  . Disturbance in sleep behavior 07/25/2015  . Elevated CK 07/25/2015  . Elevated creatine kinase level 07/25/2015  . Generalized osteoarthritis of hand 07/25/2015  . Other long term (current) drug therapy 07/25/2015  . Overactive bladder 07/25/2015  . Restless leg 07/25/2015  . Restless leg syndrome 07/25/2015  . Sleep disturbances 07/25/2015  . Idiopathic peripheral neuropathy 07/04/2015  . Lumbar radiculopathy, chronic 07/04/2015  . Mixed dementia ([place]) 07/04/2015  . Risk for falls 07/04/2015  . Sleep disturbance 07/04/2015  . Chest pain with moderate risk for cardiac etiology 06/13/2015  . Right sciatic nerve pain 10/19/2014  . Abnormal thyroid blood test 08/25/2014  . Chronic fatigue 08/09/2014  . Multiple myeloma ([place]) 07/21/2013  . Depression 01/14/2013  . Generalized anxiety disorder 01/14/2013  .  Gastroesophageal reflux disease 01/13/2013  . Urine test positive for microalbuminuria 12/08/2011  . Cyst of ovary 12/17/2010  . KNEE PAIN, BILATERAL 08/15/2010  . Abnormal gait 08/15/2010  . Atherosclerosis of coronary artery bypass graft 10/11/2009  . CHEST PAIN,  NON-CARDIAC 10/11/2009  . HIP PAIN, RIGHT 08/13/2009  . GREATER TROCHANTERIC BURSITIS 08/13/2009  . LUMBOSACRAL SPONDYLOSIS WITHOUT MYELOPATHY 11/10/2008  . Osteoarthritis of lumbosacral spine without myelopathy 11/10/2008  . FOOT PAIN, BILATERAL 06/30/2008  . Hyperlipidemia 06/22/2008  . Hypertension 06/22/2008  . Raynaud's disease 06/22/2008  . BURSITIS, LEFT SHOULDER 06/22/2008    Current Outpatient Medications on File Prior to Visit  Medication Sig Dispense Refill  . acetaminophen (TYLENOL) 650 MG CR tablet Take 1,300 mg by mouth 2 (two) times daily.    . Alum Hydroxide-Mag Carbonate (GAVISCON EXTRA STRENGTH) 508-475 MG/10ML SUSP Take 15 mLs by mouth 4 (four) times daily as needed (for reflux).     . Artificial Tear Solution (SOOTHE XP) SOLN Place 1 drop into both eyes 2 (two) times daily.    Marland Kitchen aspirin EC 81 MG tablet Take 1 tablet (81 mg total) by mouth daily. 90 tablet 3  . Calcium Carb-Cholecalciferol (CALCIUM 600+D3) 600-200 MG-UNIT TABS Take 1 tablet by mouth 2 (two) times daily.    . carvedilol (COREG) 3.125 MG tablet Take 1 tablet (3.125 mg total) by mouth 2 (two) times daily with a meal. 60 tablet 3  . cholestyramine (QUESTRAN) 4 g packet Take 4 g by mouth as directed. 1/2 pack every 2 days    . Cranberry (ELLURA) 200 MG CAPS Take 200 mg by mouth daily.    . cycloSPORINE (RESTASIS) 0.05 % ophthalmic emulsion Place 1 drop into both eyes 2 (two) times daily.     . D-Mannose POWD Take 1 Scoop by mouth daily.    Marland Kitchen docusate sodium (COLACE) 100 MG capsule Take 100 mg by mouth See admin instructions. Take 100 mg by mouth once a day and an additional 100 mg as needed for constipation    . famotidine (PEPCID) 20 MG tablet Take 1 tablet (20 mg total) by mouth 2 (two) times daily. 60 tablet 3  . gabapentin (NEURONTIN) 100 MG capsule Take 100 mg by mouth 2 (two) times daily.     . hydrALAZINE (APRESOLINE) 10 MG tablet Take 1 tablet (10 mg total) by mouth daily at 3 pm. (Patient taking  differently: Take 10 mg by mouth See admin instructions. Take 10 mg by mouth daily at 2:30 PM and an additional 10 mg two times a day as needed if Systolic reading is >400) 90 tablet 3  . iron polysaccharides (NU-IRON) 150 MG capsule Take 150 mg by mouth 2 (two) times daily.    . isosorbide mononitrate (IMDUR) 60 MG 24 hr tablet Take 60 mg by mouth daily.    Marland Kitchen levocetirizine (XYZAL) 5 MG tablet Take 5 mg by mouth daily.    Marland Kitchen lidocaine (LIDODERM) 5 % Place 1 patch onto the skin daily. Remove & Discard patch within 12 hours or as directed by MD 15 patch 0  . LORazepam (ATIVAN) 0.5 MG tablet Take 0.5 mg by mouth at bedtime.    . memantine (NAMENDA) 10 MG tablet Take 10 mg by mouth 2 (two) times daily.    . montelukast (SINGULAIR) 10 MG tablet Take 10 mg by mouth daily.    . Multiple Vitamins-Minerals (CENTRUM SILVER 50+WOMEN) TABS Take 1 tablet by mouth daily.    Marland Kitchen MYRBETRIQ 25 MG TB24 tablet  Take 25 mg by mouth daily.    . nitroGLYCERIN (NITROSTAT) 0.4 MG SL tablet Place 1 tablet (0.4 mg total) under the tongue every 5 (five) minutes as needed for chest pain. Reported on 11/12/2015 25 tablet 2  . polyethylene glycol (MIRALAX / GLYCOLAX) packet Take 17 g by mouth 2 (two) times daily. (Patient taking differently: Take 17 g by mouth daily. ) 1 each 0  . prednisoLONE acetate (PRED FORTE) 1 % ophthalmic suspension Place 1 drop into both eyes 2 (two) times daily as needed (irritation).     . Probiotic Product (RISA-BID PROBIOTIC) TABS Take 1 tablet by mouth 2 (two) times daily.    Marland Kitchen Propylene Glycol (SYSTANE BALANCE) 0.6 % SOLN Place 1 drop into both eyes 4 (four) times daily as needed (irritation).     . sertraline (ZOLOFT) 25 MG tablet Take 25 mg by mouth daily.    . sertraline (ZOLOFT) 50 MG tablet Take 50 mg by mouth every evening.     No current facility-administered medications on file prior to visit.    Allergies  Allergen Reactions  . Butazolidin [Phenylbutazone] Swelling and Rash  .  Cephalosporins Other (See Comments)    Site of swelling not recalled  . Levaquin [Levofloxacin] Shortness Of Breath  . Morphine And Related Other (See Comments)    Hallucinations   . Trimethoprim Nausea Only  . Aleve [Naproxen] Other (See Comments)    Was told by a MD to "never take this"  . Buprenorphine Other (See Comments)    "Allergic," per MAR  . Cilostazol Other (See Comments)    Stomach upset  . Codeine Nausea And Vomiting    Tolerates Tramadol  . Dexilant [Dexlansoprazole] Diarrhea  . Diclofenac Other (See Comments)    Documented on MAR  . Erythromycin Nausea And Vomiting    All "mycin" drugs  . Lactose Intolerance (Gi) Diarrhea  . Macrodantin Other (See Comments)    Double vision Tolerates MacroBID  . Morphine Other (See Comments)    Reaction not recalled  . Simvastatin Other (See Comments)    Muscle aches   . Sulfa Antibiotics Other (See Comments)    "Allergic," per MAR  . Vesicare [Solifenacin] Other (See Comments)    Reaction not recalled by the patient- gets a lot of UTI(s)  . Clindamycin/Lincomycin Other (See Comments)    Upsets the stomach  . Doxycycline Diarrhea  . Food Other (See Comments)    No spicy foods or black pepper, raw onions - causes gerd  . Ibuprofen Hives and Rash    Was told by a MD to "never take this"  . Lincomycin Hcl Nausea Only and Other (See Comments)    Upsets the stomach  . Penicillins Rash    Has patient had a PCN reaction causing immediate rash, facial/tongue/throat swelling, SOB or lightheadedness with hypotension: Yes Has patient had a PCN reaction causing severe rash involving mucus membranes or skin necrosis: No Has patient had a PCN reaction that required hospitalization No Has patient had a PCN reaction occurring within the last 10 years: No If all of the above answers are "NO", then may proceed with Cephalosporin use.   Lisbeth Ply [Fesoterodine Fumarate Er] Other (See Comments)    Reaction not recalled  . Voltaren  [Diclofenac Sodium] Rash    Objective:  General: Well developed, nourished, in no acute distress, alert and oriented x2 assisted by facility aide  Dermatology: Skin is warm, dry and supple bilateral. Right hallux medial nail bed appears  to be clean, dry, with mild granular tissue and surrounding eschar/scab. (-) Erythema. (-) Edema. (+-) serosanguous drainage present. The remaining nails appear unremarkable at this time. There are no other lesions or other signs of infection  present.  Neurovascular status: Intact.Trace lower extremity swelling, chronic varicose veins; No pain with calf compression bilateral.  Musculoskeletal: Decreased tenderness to palpation of the Right hallux medial nail fold. Muscular strength within normal limits bilateral.   Assesement and Plan: Problem List Items Addressed This Visit    None    Visit Diagnoses    S/P nail surgery    -  Primary   Toe pain, right       PVD (peripheral vascular disease) ([place])          -Examined patient  -Right 1st toe medial aspect well healed applied protective bandaid to area advised patient she may stop soaking and may stop with antibiotic drops -Educated patient on long term care after nail surgery. -Patient was instructed to monitor the toe for reoccurrence and signs of infection; Patient advised to return to office or go to ER if toe becomes red, hot or swollen. -Recommend continue with compression stocking and elevation to help with swelling  -Patient is to return in 2-3 months for nail care or sooner if problems arise.  Landis Martins, DPM

## 2019-08-24 ENCOUNTER — Encounter: Payer: Self-pay | Admitting: Adult Health

## 2019-08-24 ENCOUNTER — Ambulatory Visit (INDEPENDENT_AMBULATORY_CARE_PROVIDER_SITE_OTHER): Payer: PPO | Admitting: Adult Health

## 2019-08-24 VITALS — BP 130/62 | HR 80 | Ht 59.0 in | Wt 93.4 lb

## 2019-08-24 DIAGNOSIS — I2510 Atherosclerotic heart disease of native coronary artery without angina pectoris: Secondary | ICD-10-CM

## 2019-08-24 DIAGNOSIS — F0280 Dementia in other diseases classified elsewhere without behavioral disturbance: Secondary | ICD-10-CM

## 2019-08-24 DIAGNOSIS — R0989 Other specified symptoms and signs involving the circulatory and respiratory systems: Secondary | ICD-10-CM | POA: Diagnosis not present

## 2019-08-24 DIAGNOSIS — R296 Repeated falls: Secondary | ICD-10-CM

## 2019-08-24 DIAGNOSIS — G309 Alzheimer's disease, unspecified: Secondary | ICD-10-CM | POA: Diagnosis not present

## 2019-08-24 NOTE — Patient Instructions (Signed)
Medication Instructions:  CHANGE- Hydralazine dose time to 1 pm  *If you need a refill on your cardiac medications before your next appointment, please call your pharmacy*   Lab Work: If patient have recent blood work please faxed copy to [phone], if noty please draw labs, CBC, BMP   Testing/Procedures: None Ordered   Follow-Up: At [facility], you and your health needs are our priority.  As part of our continuing mission to provide you with exceptional heart care, we have created designated Provider Care Teams.  These Care Teams include your primary Cardiologist (physician) and Advanced Practice Providers (APPs -  Physician Assistants and Nurse Practitioners) who all work together to provide you with the care you need, when you need it.  We recommend signing up for the patient portal called "MyChart".  Sign up information is provided on this After Visit Summary.  MyChart is used to connect with patients for Virtual Visits (Telemedicine).  Patients are able to view lab/test results, encounter notes, upcoming appointments, etc.  Non-urgent messages can be sent to your provider as well.   To learn more about what you can do with MyChart, go to [email or web address].    Your next appointment:   3 month(s)  The format for your next appointment:   In Person  Provider:   You may see Minus Breeding, MD or one of the following Advanced Practice Providers on your designated Care Team:    Rosaria Ferries, PA-C  Jory Sims, DNP, ANP  Cadence Kathlen Mody, NP

## 2019-08-25 DIAGNOSIS — E785 Hyperlipidemia, unspecified: Secondary | ICD-10-CM | POA: Diagnosis not present

## 2019-08-25 DIAGNOSIS — E871 Hypo-osmolality and hyponatremia: Secondary | ICD-10-CM | POA: Diagnosis not present

## 2019-08-25 DIAGNOSIS — I2510 Atherosclerotic heart disease of native coronary artery without angina pectoris: Secondary | ICD-10-CM | POA: Diagnosis not present

## 2019-08-25 DIAGNOSIS — I10 Essential (primary) hypertension: Secondary | ICD-10-CM | POA: Diagnosis not present

## 2019-08-31 DIAGNOSIS — F0281 Dementia in other diseases classified elsewhere with behavioral disturbance: Secondary | ICD-10-CM | POA: Diagnosis not present

## 2019-09-21 DIAGNOSIS — H04123 Dry eye syndrome of bilateral lacrimal glands: Secondary | ICD-10-CM | POA: Diagnosis not present

## 2019-10-05 ENCOUNTER — Telehealth: Payer: Self-pay | Admitting: *Deleted

## 2019-10-05 NOTE — Telephone Encounter (Signed)
Called to request her May labs be drawn at [facility] and they will fax results to office. MD signed lab orders and these were faxed to [facility] area at [phone]

## 2019-10-11 ENCOUNTER — Other Ambulatory Visit: Payer: Self-pay

## 2019-10-11 ENCOUNTER — Encounter: Payer: Self-pay | Admitting: Sports Medicine

## 2019-10-11 ENCOUNTER — Ambulatory Visit (INDEPENDENT_AMBULATORY_CARE_PROVIDER_SITE_OTHER): Payer: PPO | Admitting: Sports Medicine

## 2019-10-11 VITALS — Temp 97.8°F

## 2019-10-11 DIAGNOSIS — M79674 Pain in right toe(s): Secondary | ICD-10-CM

## 2019-10-11 DIAGNOSIS — I739 Peripheral vascular disease, unspecified: Secondary | ICD-10-CM | POA: Diagnosis not present

## 2019-10-11 DIAGNOSIS — L603 Nail dystrophy: Secondary | ICD-10-CM

## 2019-10-11 DIAGNOSIS — M79675 Pain in left toe(s): Secondary | ICD-10-CM

## 2019-10-11 NOTE — Progress Notes (Signed)
Subjective: [name] is a 86 y.o. female patient seen today in office with complaint of mildly painful elongated toenails that are not too bad but daughter thought it was time to get them trimmed again. No other issues at this time.  Patient Active Problem List   Diagnosis Date Noted  . Fecal impaction ([place]) 06/27/2019  . AMS (altered mental status) 05/18/2019  . Recurrent urinary tract infection 05/18/2019  . Left hip pain 06/30/2018  . Disc degeneration, lumbar 06/23/2018  . Left thyroid nodule 08/29/2017  . Chronic idiopathic constipation 08/18/2017  . Abdominal pain 07/31/2017  . Mesenteric ischemia ([place]) 05/04/2017  . Unsteadiness on feet   . Unstable angina pectoris ([place])   . Tinea pedis   . Spinal stenosis of lumbar region   . Somnolence   . Seasonal allergic rhinitis   . Restless legs syndrome   . Raynaud's syndrome   . Raynauds syndrome   . Radiculopathy   . Panic disorder   . Ovarian cyst, bilateral   . Osteopenia   . Myocardial infarction ([place])   . Muscle weakness (generalized)   . Mixed incontinence   . Mild chronic anemia   . Major depressive disorder   . Long term (current) use of aspirin   . Intermediate coronary syndrome ([place])   . Incontinent of urine   . IBS (irritable bowel syndrome)   . Hypercholesteremia   . Hx of CABG   . GERD (gastroesophageal reflux disease)   . Dementia ([place])   . Chest pain   . Atherosclerotic heart disease of native coronary artery without angina pectoris   . Arthritis   . Anxiety   . Allergic rhinitis   . Abnormal weight loss   . History of adenomatous polyp of colon 03/02/2017  . NSTEMI (non-ST elevated myocardial infarction) ([place]) 02/02/2017  . SOB (shortness of breath) 09/02/2016  . Dizziness 09/02/2016  . Non-ST elevation (NSTEMI) myocardial infarction ([place]) 11/26/2015  . Polymyalgia rheumatica ([place]) 11/26/2015  . Hypokalemia 11/26/2015  . Mild persistent asthma 11/12/2015  . Coronary artery disease  involving autologous artery coronary bypass graft with angina pectoris with documented spasm ([place]) 11/12/2015  . Multiple myeloma in remission ([place]) 11/12/2015  . Current use of beta blocker 11/12/2015  . Gastroesophageal reflux disease without esophagitis 11/12/2015  . Other allergic rhinitis 11/12/2015  . Weight loss, non-intentional 10/03/2015  . Asthma 09/24/2015  . Asthma in adult without complication 35/32/9924  . Seasonal allergic rhinitis due to pollen 09/24/2015  . Cervical radiculopathy 07/25/2015  . Chronic coronary artery disease 07/25/2015  . Coronary artery disease 07/25/2015  . Disturbance in sleep behavior 07/25/2015  . Elevated CK 07/25/2015  . Elevated creatine kinase level 07/25/2015  . Generalized osteoarthritis of hand 07/25/2015  . Other long term (current) drug therapy 07/25/2015  . Overactive bladder 07/25/2015  . Restless leg 07/25/2015  . Restless leg syndrome 07/25/2015  . Sleep disturbances 07/25/2015  . Idiopathic peripheral neuropathy 07/04/2015  . Lumbar radiculopathy, chronic 07/04/2015  . Mixed dementia ([place]) 07/04/2015  . Risk for falls 07/04/2015  . Sleep disturbance 07/04/2015  . Chest pain with moderate risk for cardiac etiology 06/13/2015  . Right sciatic nerve pain 10/19/2014  . Abnormal thyroid blood test 08/25/2014  . Chronic fatigue 08/09/2014  . Multiple myeloma ([place]) 07/21/2013  . Depression 01/14/2013  . Generalized anxiety disorder 01/14/2013  . Gastroesophageal reflux disease 01/13/2013  . Urine test positive for microalbuminuria 12/08/2011  . Cyst of ovary  12/17/2010  . KNEE PAIN, BILATERAL 08/15/2010  . Abnormal gait 08/15/2010  . Atherosclerosis of coronary artery bypass graft 10/11/2009  . CHEST PAIN, NON-CARDIAC 10/11/2009  . HIP PAIN, RIGHT 08/13/2009  . GREATER TROCHANTERIC BURSITIS 08/13/2009  . LUMBOSACRAL SPONDYLOSIS WITHOUT MYELOPATHY 11/10/2008  . Osteoarthritis of lumbosacral spine without myelopathy 11/10/2008  .  FOOT PAIN, BILATERAL 06/30/2008  . Hyperlipidemia 06/22/2008  . Hypertension 06/22/2008  . Raynaud's disease 06/22/2008  . BURSITIS, LEFT SHOULDER 06/22/2008    Current Outpatient Medications on File Prior to Visit  Medication Sig Dispense Refill  . acetaminophen (TYLENOL) 325 MG tablet Take 650 mg by mouth every 4 (four) hours as needed.    . Alum Hydroxide-Mag Carbonate (GAVISCON EXTRA STRENGTH) 508-475 MG/10ML SUSP Take 15 mLs by mouth 4 (four) times daily as needed (for reflux).     . Artificial Tear Solution (SOOTHE XP) SOLN Place 1 drop into both eyes 2 (two) times daily.    Marland Kitchen aspirin EC 81 MG tablet Take 1 tablet (81 mg total) by mouth daily. 90 tablet 3  . Baclofen 5 MG TABS Take 5 mg by mouth in the morning, at noon, and at bedtime. As needed    . bisacodyl (BISAC-EVAC) 10 MG suppository Place 10 mg rectally as needed for moderate constipation.    . Calcium Carb-Cholecalciferol (CALCIUM 600+D3) 600-200 MG-UNIT TABS Take 1 tablet by mouth 2 (two) times daily.    . calcium carbonate (OSCAL) 1500 (600 Ca) MG TABS tablet Take by mouth.    . carvedilol (COREG) 3.125 MG tablet Take 1 tablet (3.125 mg total) by mouth 2 (two) times daily with a meal. 60 tablet 3  . cholestyramine (QUESTRAN) 4 g packet Take 4 g by mouth as directed. 1/2 pack every 2 days    . conjugated estrogens (PREMARIN) vaginal cream Place 1 Applicatorful vaginally daily.    . Cranberry (ELLURA) 200 MG CAPS Take 200 mg by mouth daily.    . cycloSPORINE (RESTASIS) 0.05 % ophthalmic emulsion Place 1 drop into both eyes 2 (two) times daily.     . D-Mannose POWD Take 1 Scoop by mouth daily.    . D-Mannose POWD by Does not apply route.    . docusate sodium (COLACE) 100 MG capsule Take 100 mg by mouth See admin instructions. Take 100 mg by mouth once a day and an additional 100 mg as needed for constipation    . escitalopram (LEXAPRO) 10 MG tablet Take 10 mg by mouth daily.    . Eyelid Cleansers (OCUSOFT LID SCRUB EX) Apply  topically.    . famotidine (PEPCID) 20 MG tablet Take 1 tablet (20 mg total) by mouth 2 (two) times daily. 60 tablet 3  . famotidine (PEPCID) 40 MG tablet Take 40 mg by mouth 2 (two) times daily.    Marland Kitchen gabapentin (NEURONTIN) 100 MG capsule Take 100 mg by mouth 2 (two) times daily.     . hydrALAZINE (APRESOLINE) 10 MG tablet Take 1 tablet (10 mg total) by mouth daily at 3 pm. (Patient taking differently: Take 10 mg by mouth See admin instructions. Take 10 mg by mouth daily at 2:30 PM and an additional 10 mg two times a day as needed if Systolic reading is >468) 90 tablet 3  . hydrALAZINE (APRESOLINE) 25 MG tablet Take 25 mg by mouth 2 (two) times daily.    . hyoscyamine (LEVSIN SL) 0.125 MG SL tablet Place 0.125 mg under the tongue every 6 (six) hours as needed.    Marland Kitchen  iron polysaccharides (NU-IRON) 150 MG capsule Take 150 mg by mouth 2 (two) times daily.    . isosorbide mononitrate (IMDUR) 60 MG 24 hr tablet Take 60 mg by mouth daily.    Marland Kitchen levocetirizine (XYZAL) 5 MG tablet Take 5 mg by mouth daily.    Marland Kitchen lidocaine (LIDODERM) 5 % Place 1 patch onto the skin daily. Remove & Discard patch within 12 hours or as directed by MD 15 patch 0  . LORazepam (ATIVAN) 0.5 MG tablet Take 0.5 mg by mouth at bedtime.    . melatonin 5 MG TABS Take 5 mg by mouth at bedtime as needed.    . memantine (NAMENDA) 10 MG tablet Take 10 mg by mouth 2 (two) times daily.    . montelukast (SINGULAIR) 10 MG tablet Take 10 mg by mouth daily.    . Multiple Vitamins-Minerals (CENTRUM SILVER 50+WOMEN) TABS Take 1 tablet by mouth daily.    Marland Kitchen MYRBETRIQ 25 MG TB24 tablet Take 25 mg by mouth daily.    . nitroGLYCERIN (NITROSTAT) 0.4 MG SL tablet Place 1 tablet (0.4 mg total) under the tongue every 5 (five) minutes as needed for chest pain. Reported on 11/12/2015 25 tablet 2  . Peppermint Oil (IBGARD) 90 MG CPCR Take by mouth.    . polyethylene glycol (MIRALAX / GLYCOLAX) packet Take 17 g by mouth 2 (two) times daily. (Patient taking  differently: Take 17 g by mouth daily. ) 1 each 0  . prednisoLONE acetate (PRED FORTE) 1 % ophthalmic suspension Place 1 drop into both eyes 2 (two) times daily as needed (irritation).     . Probiotic Product (RISA-BID PROBIOTIC) TABS Take 1 tablet by mouth 2 (two) times daily.    Marland Kitchen Propylene Glycol (SYSTANE BALANCE) 0.6 % SOLN Place 1 drop into both eyes 4 (four) times daily as needed (irritation).     . traMADol (ULTRAM) 50 MG tablet Take by mouth every 6 (six) hours as needed.    . sertraline (ZOLOFT) 25 MG tablet Take 25 mg by mouth as directed.    . sertraline (ZOLOFT) 50 MG tablet Take 50 mg by mouth every evening.     No current facility-administered medications on file prior to visit.    Allergies  Allergen Reactions  . Butazolidin [Phenylbutazone] Swelling and Rash  . Cephalosporins Other (See Comments)    Site of swelling not recalled  . Levaquin [Levofloxacin] Shortness Of Breath  . Morphine And Related Other (See Comments)    Hallucinations   . Trimethoprim Nausea Only  . Aleve [Naproxen] Other (See Comments)    Was told by a MD to "never take this"  . Buprenorphine Other (See Comments)    "Allergic," per MAR  . Cilostazol Other (See Comments)    Stomach upset  . Codeine Nausea And Vomiting    Tolerates Tramadol  . Dexilant [Dexlansoprazole] Diarrhea  . Diclofenac Other (See Comments)    Documented on MAR  . Erythromycin Nausea And Vomiting    All "mycin" drugs  . Lactose Intolerance (Gi) Diarrhea  . Macrodantin Other (See Comments)    Double vision Tolerates MacroBID  . Morphine Other (See Comments)    Reaction not recalled  . Simvastatin Other (See Comments)    Muscle aches   . Sulfa Antibiotics Other (See Comments)    "Allergic," per MAR  . Vesicare [Solifenacin] Other (See Comments)    Reaction not recalled by the patient- gets a lot of UTI(s)  . Clindamycin/Lincomycin Other (See Comments)  Upsets the stomach  . Doxycycline Diarrhea  . Food Other  (See Comments)    No spicy foods or black pepper, raw onions - causes gerd  . Ibuprofen Hives and Rash    Was told by a MD to "never take this"  . Lincomycin Hcl Nausea Only and Other (See Comments)    Upsets the stomach  . Penicillins Rash    Has patient had a PCN reaction causing immediate rash, facial/tongue/throat swelling, SOB or lightheadedness with hypotension: Yes Has patient had a PCN reaction causing severe rash involving mucus membranes or skin necrosis: No Has patient had a PCN reaction that required hospitalization No Has patient had a PCN reaction occurring within the last 10 years: No If all of the above answers are "NO", then may proceed with Cephalosporin use.   Lisbeth Ply [Fesoterodine Fumarate Er] Other (See Comments)    Reaction not recalled  . Voltaren [Diclofenac Sodium] Rash    Objective: Physical Exam  General: Well developed, nourished, no acute distress, awake, alert and oriented x 2  Patient assisted by Facility aide  Vascular: Dorsalis pedis artery 1/4 bilateral, Posterior tibial artery 0/4 bilateral, skin temperature warm to warm proximal to distal bilateral lower extremities, mild varicosities, pedal hair present bilateral.  +edema bilateral.   Neurological: Gross sensation present via light touch bilateral.   Dermatological: Skin is warm, dry, and supple bilateral, Nails 1-10 are tender, long, thick, and discolored with mild subungal debris with no pain at hallux nails at areas of previous procedure, well healed, no webspace macerations present bilateral, no open lesions present bilateral, no callus/corns/hyperkeratotic tissue present bilateral. No signs of infection bilateral.  Musculoskeletal: Asymptomatic hallux malleous L>R boney deformities noted bilateral. Muscular strength 4/5 without painon range of motion.+ History of arthritis. No pain with calf compression bilateral.  Assessment and Plan:  Problem List Items Addressed This Visit    None     Visit Diagnoses    Nail dystrophy    -  Primary   Toe pain, bilateral       PVD (peripheral vascular disease) ([place])         -Examined patient.  -Mechanically debrided and reduced nails with sterile nail nipper and dremel nail file without incident -Recommend again Epsom salt soaks and corticosporin solution as needed at this time because no pain to toes may d/c -Advised patient to use lidocaine pain cream to use to toes as needed -Advised open toed compression stockings and sandals to avoid rubbing at toes like before -Recommend elevation to assist with edema ontrol  -Patient to return in 2 months for nail trim or sooner if symptoms worsen.  Landis Martins, DPM

## 2019-10-12 ENCOUNTER — Telehealth: Payer: Self-pay | Admitting: *Deleted

## 2019-10-12 NOTE — Telephone Encounter (Signed)
The soaking and drops are only as needed if her toes are sore. Patient has had the drops in the past and has not had a reaction but to be safe she can soak with warm water and epsom salt for 15-20 mins and then apply polysporin instead if they do not want to use the drops. Again this is only if her toes are sore. If they do not hurt then she does not need to do the soaks or use the drops or cream.  -Dr. Cannon Kettle

## 2019-10-12 NOTE — Telephone Encounter (Signed)
Pt's dtr, [name] would like more information on the pt's epsom salt soaks after dinner and the drops given pt is allergic to multiple ingredients.

## 2019-10-13 ENCOUNTER — Other Ambulatory Visit: Payer: PPO

## 2019-10-13 NOTE — Telephone Encounter (Signed)
I informed pt's dtr, [name] of Dr. Leeanne Rio 10/12/2019 5:48pm recommendations. [name] states she also wanted to know if the edema was causing the pain also. I told her it could and the pressure on the feet and toes from the too tight shoes could also and I recommended elevation and rest, and that often PCP could be helpful in decreasing the edema. [name] states the care facility is her PCP. I told her that I meant pt's primary care physician, then [name] states she understood what I meant. I told her is had not, but now understood it was the care facility staff doctor.

## 2019-10-14 DIAGNOSIS — R2681 Unsteadiness on feet: Secondary | ICD-10-CM | POA: Diagnosis not present

## 2019-10-14 DIAGNOSIS — F0390 Unspecified dementia without behavioral disturbance: Secondary | ICD-10-CM | POA: Diagnosis not present

## 2019-10-14 DIAGNOSIS — Z9181 History of falling: Secondary | ICD-10-CM | POA: Diagnosis not present

## 2019-10-18 DIAGNOSIS — R2681 Unsteadiness on feet: Secondary | ICD-10-CM | POA: Diagnosis not present

## 2019-10-18 DIAGNOSIS — Z9181 History of falling: Secondary | ICD-10-CM | POA: Diagnosis not present

## 2019-10-18 DIAGNOSIS — F0390 Unspecified dementia without behavioral disturbance: Secondary | ICD-10-CM | POA: Diagnosis not present

## 2019-10-21 DIAGNOSIS — M79621 Pain in right upper arm: Secondary | ICD-10-CM | POA: Diagnosis not present

## 2019-10-21 DIAGNOSIS — R2681 Unsteadiness on feet: Secondary | ICD-10-CM | POA: Diagnosis not present

## 2019-10-21 DIAGNOSIS — M25511 Pain in right shoulder: Secondary | ICD-10-CM | POA: Diagnosis not present

## 2019-10-21 DIAGNOSIS — Z9181 History of falling: Secondary | ICD-10-CM | POA: Diagnosis not present

## 2019-10-21 DIAGNOSIS — W19XXXA Unspecified fall, initial encounter: Secondary | ICD-10-CM | POA: Diagnosis not present

## 2019-10-21 DIAGNOSIS — M79631 Pain in right forearm: Secondary | ICD-10-CM | POA: Diagnosis not present

## 2019-10-21 DIAGNOSIS — F0390 Unspecified dementia without behavioral disturbance: Secondary | ICD-10-CM | POA: Diagnosis not present

## 2019-10-22 DIAGNOSIS — R0781 Pleurodynia: Secondary | ICD-10-CM | POA: Diagnosis not present

## 2019-10-22 DIAGNOSIS — W19XXXA Unspecified fall, initial encounter: Secondary | ICD-10-CM | POA: Diagnosis not present

## 2019-10-26 DIAGNOSIS — Z9181 History of falling: Secondary | ICD-10-CM | POA: Diagnosis not present

## 2019-10-26 DIAGNOSIS — M25511 Pain in right shoulder: Secondary | ICD-10-CM | POA: Diagnosis not present

## 2019-10-26 DIAGNOSIS — M6281 Muscle weakness (generalized): Secondary | ICD-10-CM | POA: Diagnosis not present

## 2019-10-26 DIAGNOSIS — F0390 Unspecified dementia without behavioral disturbance: Secondary | ICD-10-CM | POA: Diagnosis not present

## 2019-10-26 DIAGNOSIS — R2681 Unsteadiness on feet: Secondary | ICD-10-CM | POA: Diagnosis not present

## 2019-10-27 ENCOUNTER — Telehealth: Payer: Self-pay | Admitting: *Deleted

## 2019-10-27 DIAGNOSIS — F0390 Unspecified dementia without behavioral disturbance: Secondary | ICD-10-CM | POA: Diagnosis not present

## 2019-10-27 DIAGNOSIS — M25511 Pain in right shoulder: Secondary | ICD-10-CM | POA: Diagnosis not present

## 2019-10-27 DIAGNOSIS — M6281 Muscle weakness (generalized): Secondary | ICD-10-CM | POA: Diagnosis not present

## 2019-10-27 DIAGNOSIS — Z9181 History of falling: Secondary | ICD-10-CM | POA: Diagnosis not present

## 2019-10-27 DIAGNOSIS — R2681 Unsteadiness on feet: Secondary | ICD-10-CM | POA: Diagnosis not present

## 2019-10-27 NOTE — Telephone Encounter (Signed)
MD reviewed labs from [facility] and feels her myeloma is slowly progressing and she may need treatment soon. Wants to see her on 11/16/19 at 12:00. Daughter, [name] agrees to the appointment. She will call facility to transport. She may need to called on speaker phone during the visit if she can't get here. Patient is getting more confused and is barely able to ambulate now.

## 2019-10-28 ENCOUNTER — Telehealth: Payer: Self-pay | Admitting: *Deleted

## 2019-10-28 DIAGNOSIS — F0390 Unspecified dementia without behavioral disturbance: Secondary | ICD-10-CM | POA: Diagnosis not present

## 2019-10-28 DIAGNOSIS — R2681 Unsteadiness on feet: Secondary | ICD-10-CM | POA: Diagnosis not present

## 2019-10-28 DIAGNOSIS — M25511 Pain in right shoulder: Secondary | ICD-10-CM | POA: Diagnosis not present

## 2019-10-28 DIAGNOSIS — Z9181 History of falling: Secondary | ICD-10-CM | POA: Diagnosis not present

## 2019-10-28 NOTE — Telephone Encounter (Signed)
Daughter called to cancel her appointment and is canceling all her future MD appointments. She is moving from memory care to skilled care due to significant, rapid decline. Does not feel any treatment would be of benefit for her at this time. Appreciates all the excellent care Dr. Benay Spice provided, saying patient really appreciated Dr. Benay Spice.

## 2019-10-30 DIAGNOSIS — R2681 Unsteadiness on feet: Secondary | ICD-10-CM | POA: Diagnosis not present

## 2019-10-30 DIAGNOSIS — M6281 Muscle weakness (generalized): Secondary | ICD-10-CM | POA: Diagnosis not present

## 2019-10-30 DIAGNOSIS — Z9181 History of falling: Secondary | ICD-10-CM | POA: Diagnosis not present

## 2019-10-30 DIAGNOSIS — F0390 Unspecified dementia without behavioral disturbance: Secondary | ICD-10-CM | POA: Diagnosis not present

## 2019-11-01 DIAGNOSIS — M25511 Pain in right shoulder: Secondary | ICD-10-CM | POA: Diagnosis not present

## 2019-11-01 DIAGNOSIS — F0390 Unspecified dementia without behavioral disturbance: Secondary | ICD-10-CM | POA: Diagnosis not present

## 2019-11-01 DIAGNOSIS — Z9181 History of falling: Secondary | ICD-10-CM | POA: Diagnosis not present

## 2019-11-01 DIAGNOSIS — R2681 Unsteadiness on feet: Secondary | ICD-10-CM | POA: Diagnosis not present

## 2019-11-02 ENCOUNTER — Ambulatory Visit: Payer: PPO | Admitting: Family Medicine

## 2019-11-03 DIAGNOSIS — R2681 Unsteadiness on feet: Secondary | ICD-10-CM | POA: Diagnosis not present

## 2019-11-03 DIAGNOSIS — M25511 Pain in right shoulder: Secondary | ICD-10-CM | POA: Diagnosis not present

## 2019-11-03 DIAGNOSIS — M6281 Muscle weakness (generalized): Secondary | ICD-10-CM | POA: Diagnosis not present

## 2019-11-03 DIAGNOSIS — F0390 Unspecified dementia without behavioral disturbance: Secondary | ICD-10-CM | POA: Diagnosis not present

## 2019-11-03 DIAGNOSIS — Z9181 History of falling: Secondary | ICD-10-CM | POA: Diagnosis not present

## 2019-11-04 DIAGNOSIS — Z9181 History of falling: Secondary | ICD-10-CM | POA: Diagnosis not present

## 2019-11-04 DIAGNOSIS — M6281 Muscle weakness (generalized): Secondary | ICD-10-CM | POA: Diagnosis not present

## 2019-11-04 DIAGNOSIS — F0390 Unspecified dementia without behavioral disturbance: Secondary | ICD-10-CM | POA: Diagnosis not present

## 2019-11-04 DIAGNOSIS — R2681 Unsteadiness on feet: Secondary | ICD-10-CM | POA: Diagnosis not present

## 2019-11-04 DIAGNOSIS — M25511 Pain in right shoulder: Secondary | ICD-10-CM | POA: Diagnosis not present

## 2019-11-07 DIAGNOSIS — M25511 Pain in right shoulder: Secondary | ICD-10-CM | POA: Diagnosis not present

## 2019-11-07 DIAGNOSIS — Z9181 History of falling: Secondary | ICD-10-CM | POA: Diagnosis not present

## 2019-11-07 DIAGNOSIS — M6281 Muscle weakness (generalized): Secondary | ICD-10-CM | POA: Diagnosis not present

## 2019-11-07 DIAGNOSIS — F0390 Unspecified dementia without behavioral disturbance: Secondary | ICD-10-CM | POA: Diagnosis not present

## 2019-11-07 DIAGNOSIS — R2681 Unsteadiness on feet: Secondary | ICD-10-CM | POA: Diagnosis not present

## 2019-11-09 DIAGNOSIS — Z9181 History of falling: Secondary | ICD-10-CM | POA: Diagnosis not present

## 2019-11-09 DIAGNOSIS — M6281 Muscle weakness (generalized): Secondary | ICD-10-CM | POA: Diagnosis not present

## 2019-11-09 DIAGNOSIS — R2681 Unsteadiness on feet: Secondary | ICD-10-CM | POA: Diagnosis not present

## 2019-11-09 DIAGNOSIS — F0390 Unspecified dementia without behavioral disturbance: Secondary | ICD-10-CM | POA: Diagnosis not present

## 2019-11-09 DIAGNOSIS — M25511 Pain in right shoulder: Secondary | ICD-10-CM | POA: Diagnosis not present

## 2019-11-10 ENCOUNTER — Telehealth: Payer: Self-pay | Admitting: Family Medicine

## 2019-11-10 ENCOUNTER — Telehealth: Payer: Self-pay | Admitting: *Deleted

## 2019-11-10 ENCOUNTER — Telehealth: Payer: Self-pay | Admitting: Cardiology

## 2019-11-10 NOTE — Telephone Encounter (Signed)
Daughter of the patient called. The daughter wanted to make Dr. Percival Spanish aware of some symptoms the patient is having. The daughter said it is not urgent but she would like to speak with a nurse ASAP.

## 2019-11-10 NOTE — Telephone Encounter (Signed)
[name],Tammy(daughter on DPR) is asking for a call from Amy,NP.  [name],Tammy stated the matter is complex and she would just prefer to speak with [name].  She did relay that it has to do with a change in the health of pt.  Please call [name] @ [phone], she will be in meetings throughout the day but if the call is missed she will call Amy,NP back

## 2019-11-10 NOTE — Telephone Encounter (Signed)
Daughter is needing to have her moved from memory care at [facility] due to increase in her physical needs. Looking at potential rooms there. She is asking if she does not like any of the rooms there, [facility] memory care unit will accept her with her physical needs. This will require a physician to be her PCP while there. Current PCP is affiliated w/[facility] and not [facility]. Asking if Dr. Benay Spice would be her PCP if she is moved there? Informed her that currently I am unable to ask MD (out of office till Monday).

## 2019-11-10 NOTE — Telephone Encounter (Signed)
I am sorry but I cannot, per our group protocols, be the primary care as we cross cover each other and the agreement is that we , as a group can not provide that level of care, she would also be better served by a primary care who specializes in geriatrics.

## 2019-11-10 NOTE — Telephone Encounter (Signed)
Left voicemail for daughter, Dr. Percival Spanish is unable to assume the role of primary care provider for patient.

## 2019-11-10 NOTE — Telephone Encounter (Signed)
Patient's daughter called in to speak with Dr. Percival Spanish. She reports patient is currently in a memory care unit at [facility] in [place]. She reports patient needs assistance with transfers and the facility is no longer able to accommodate patient as they do not allow the use of lifts. She wants to move patient to another memory care/assisted living that allows the use of lifts. She states if she does so she has to find a primary care physician for the patient and she is requesting Dr. Percival Spanish assume that role. Informed daughter Dr. Percival Spanish specializes in cardiology and not primary care. She insists Dr. Percival Spanish be asked. Will send a message to Dr. Percival Spanish for review.

## 2019-11-14 NOTE — Telephone Encounter (Signed)
Daughter left message that she has decided to leave her at [facility], so will not need Dr. Benay Spice to assume PCP role. Prior to call Dr. Benay Spice had said he would not be able to be her PCP.

## 2019-11-16 ENCOUNTER — Ambulatory Visit: Payer: PPO | Admitting: Oncology

## 2019-11-17 NOTE — Telephone Encounter (Signed)
Noted  

## 2019-11-17 NOTE — Telephone Encounter (Signed)
I have attempted to reach [name] but continue to get voice mail. I have left her a message and asked her to return call when she is able. If I am unavailable, please see if there is anything they need acutely or if she is just updating [name] on condition. I will continue to try to reach her. TY.

## 2019-11-17 NOTE — Telephone Encounter (Signed)
[name] returned call and LVM stating that the situation has worked it's self out so there is no need to call back. [name] thanks provider and RN.

## 2019-11-19 DIAGNOSIS — M25552 Pain in left hip: Secondary | ICD-10-CM | POA: Diagnosis not present

## 2019-11-19 DIAGNOSIS — W19XXXA Unspecified fall, initial encounter: Secondary | ICD-10-CM | POA: Diagnosis not present

## 2019-11-21 DIAGNOSIS — R52 Pain, unspecified: Secondary | ICD-10-CM | POA: Diagnosis not present

## 2019-11-21 DIAGNOSIS — S72002A Fracture of unspecified part of neck of left femur, initial encounter for closed fracture: Secondary | ICD-10-CM | POA: Diagnosis not present

## 2019-11-21 DIAGNOSIS — Z9181 History of falling: Secondary | ICD-10-CM | POA: Diagnosis not present

## 2019-11-21 DIAGNOSIS — C9000 Multiple myeloma not having achieved remission: Secondary | ICD-10-CM | POA: Diagnosis not present

## 2019-11-21 DIAGNOSIS — F0390 Unspecified dementia without behavioral disturbance: Secondary | ICD-10-CM | POA: Diagnosis not present

## 2019-11-21 DIAGNOSIS — I10 Essential (primary) hypertension: Secondary | ICD-10-CM | POA: Diagnosis not present

## 2019-11-22 DIAGNOSIS — R2681 Unsteadiness on feet: Secondary | ICD-10-CM | POA: Diagnosis not present

## 2019-11-22 DIAGNOSIS — S72002A Fracture of unspecified part of neck of left femur, initial encounter for closed fracture: Secondary | ICD-10-CM | POA: Diagnosis not present

## 2019-11-24 ENCOUNTER — Ambulatory Visit: Payer: PPO | Admitting: Cardiology

## 2019-12-03 DIAGNOSIS — F339 Major depressive disorder, recurrent, unspecified: Secondary | ICD-10-CM | POA: Diagnosis not present

## 2019-12-03 DIAGNOSIS — F0390 Unspecified dementia without behavioral disturbance: Secondary | ICD-10-CM | POA: Diagnosis not present

## 2019-12-03 DIAGNOSIS — W19XXXD Unspecified fall, subsequent encounter: Secondary | ICD-10-CM | POA: Diagnosis not present

## 2019-12-03 DIAGNOSIS — I10 Essential (primary) hypertension: Secondary | ICD-10-CM | POA: Diagnosis not present

## 2019-12-03 DIAGNOSIS — F419 Anxiety disorder, unspecified: Secondary | ICD-10-CM | POA: Diagnosis not present

## 2019-12-03 DIAGNOSIS — S72002G Fracture of unspecified part of neck of left femur, subsequent encounter for closed fracture with delayed healing: Secondary | ICD-10-CM | POA: Diagnosis not present

## 2020-01-08 DEATH — deceased

## 2021-04-14 IMAGING — CR DG CHEST 2V
2 series · 2 of 2 positions shown · non-contrast
Comparison: Chest radiographs 04/28/2018 and earlier.

CLINICAL DATA: 87-year-old female status post fall.

EXAM:
CHEST - 2 VIEW

[w chest lat]
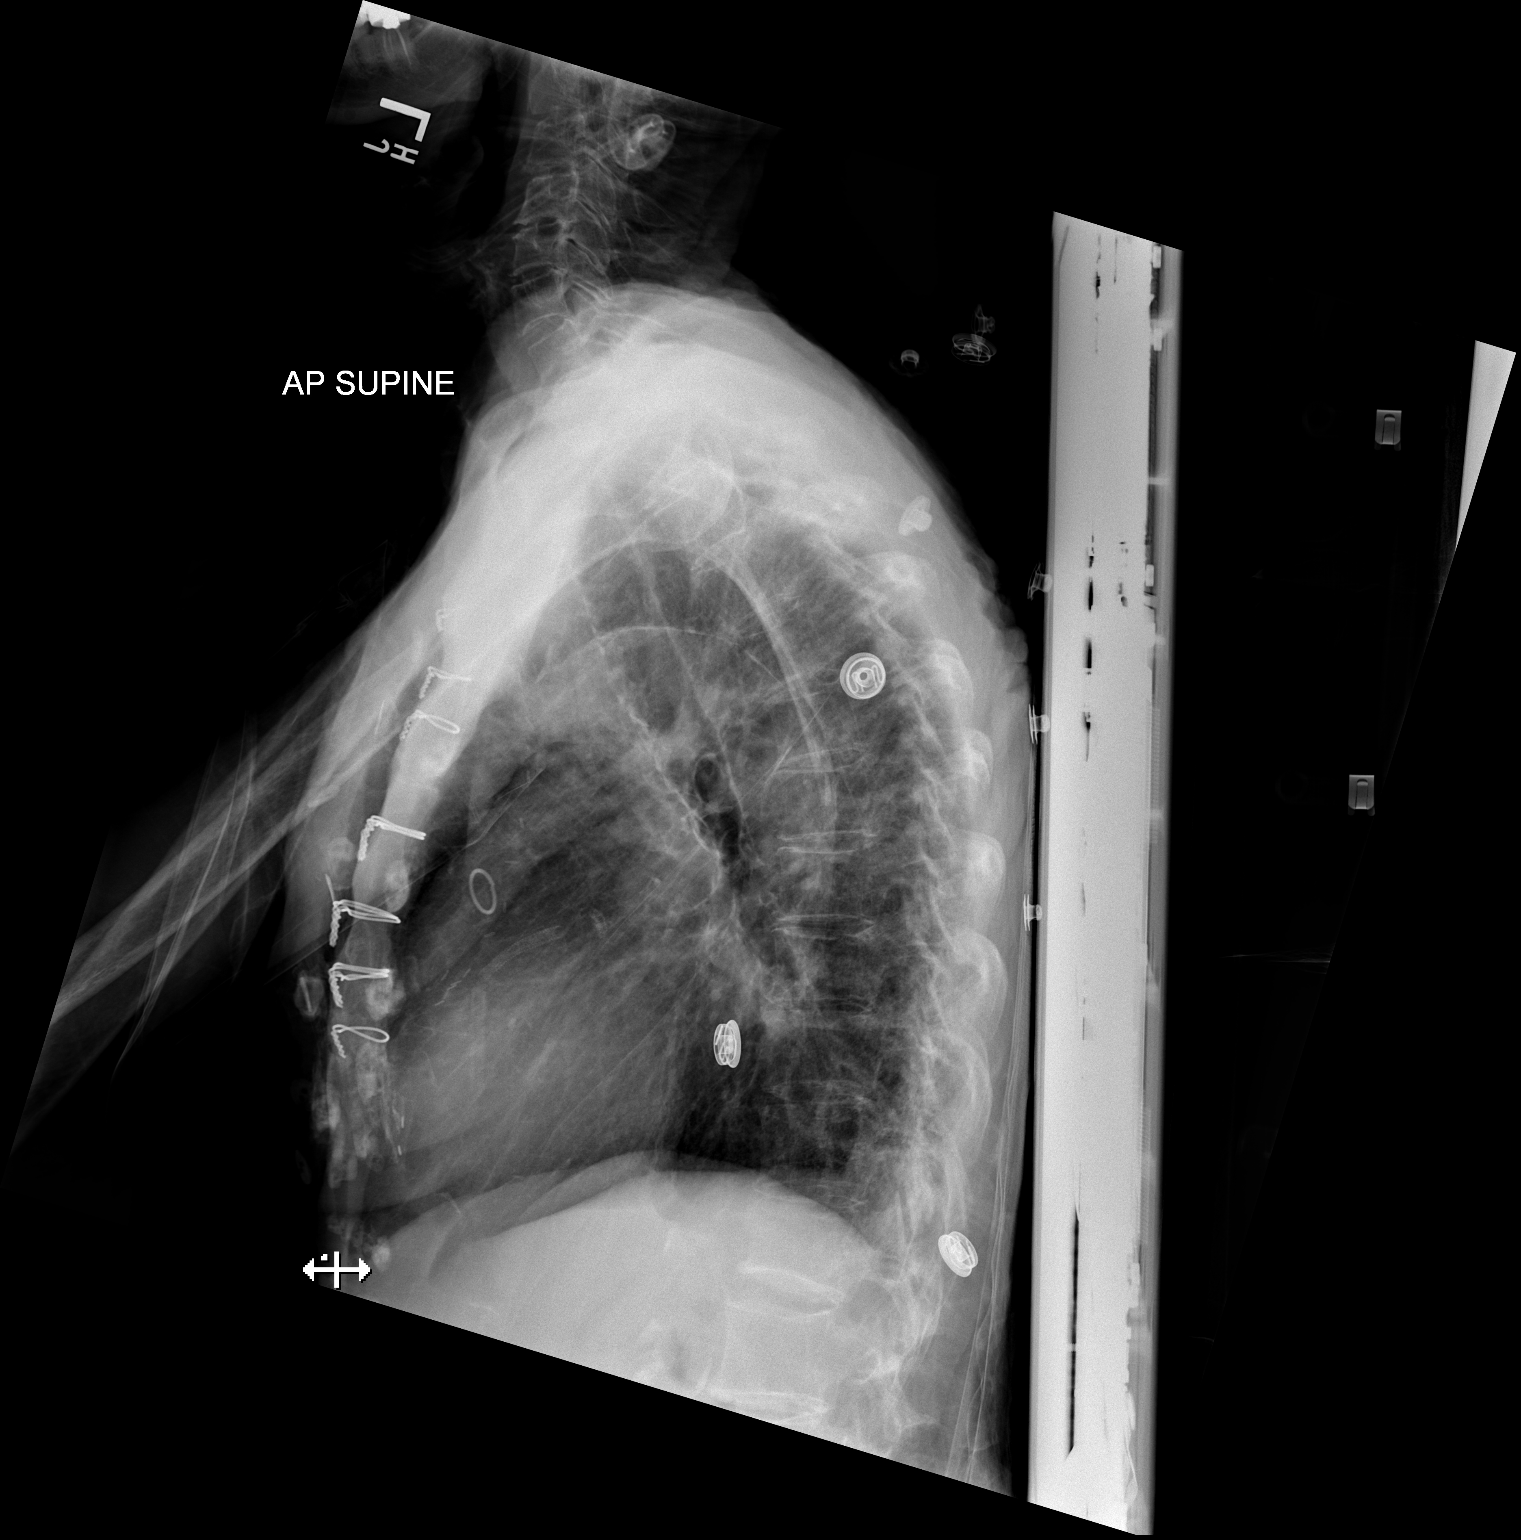

[x chest ap]
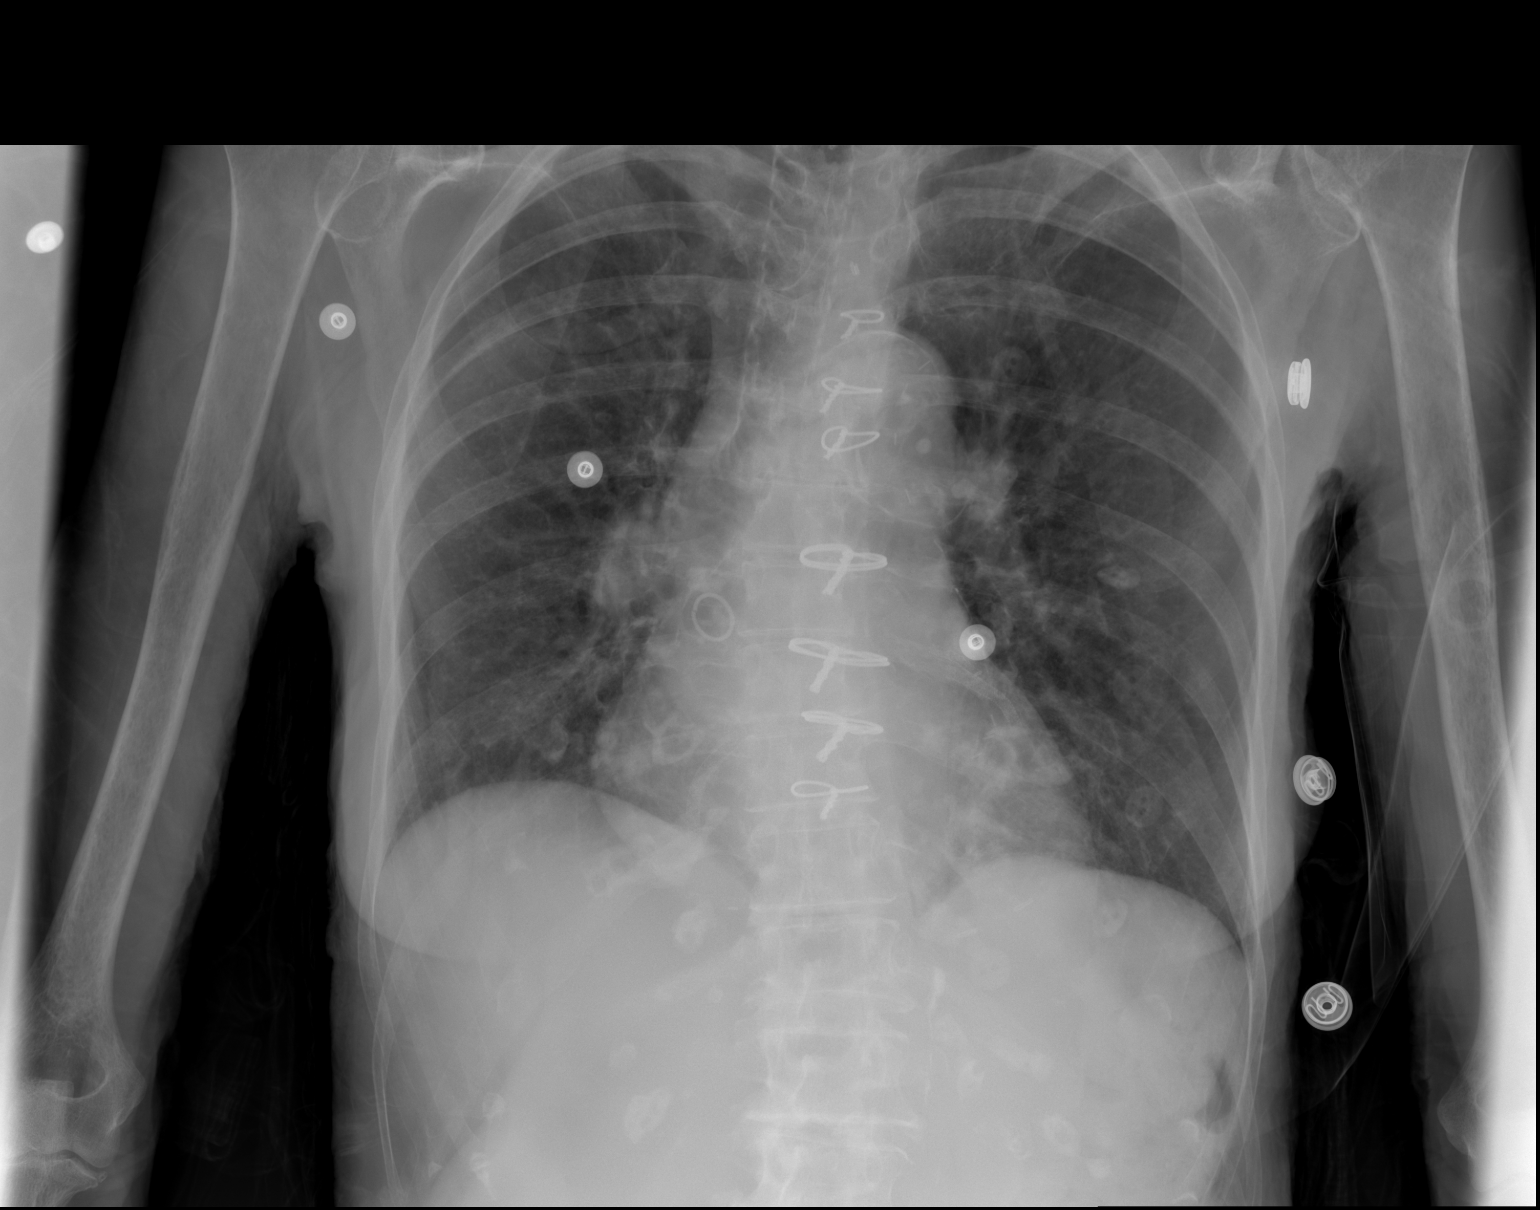

[2 of 2 positions shown; findings below may reference images not displayed]

FINDINGS: AP supine and supine lateral views of the chest. Stable mediastinal
contours. Prior CABG. Visualized tracheal air column is within
normal limits. Lower lung volumes. No pneumothorax, pulmonary edema,
pleural effusion or acute pulmonary opacity.

Calcified aortic atherosclerosis. Osteopenia. Stable visualized
osseous structures. Paucity of bowel gas in the upper abdomen.
IMPRESSION: 1. No acute cardiopulmonary abnormality or acute traumatic injury
identified.
2. Aortic Atherosclerosis ([id number]).

## 2021-04-14 IMAGING — CR DG HIP (WITH OR WITHOUT PELVIS) 3-4V BILAT
5 series · 5 of 5 positions shown · non-contrast
Comparison: Left hip series 06/30/2018. Left hip MRI 06/30/2018.

CLINICAL DATA: 87-year-old female status post fall.

EXAM:
DG HIP (WITH OR WITHOUT PELVIS) 3-4V BILAT

[x pelvis]
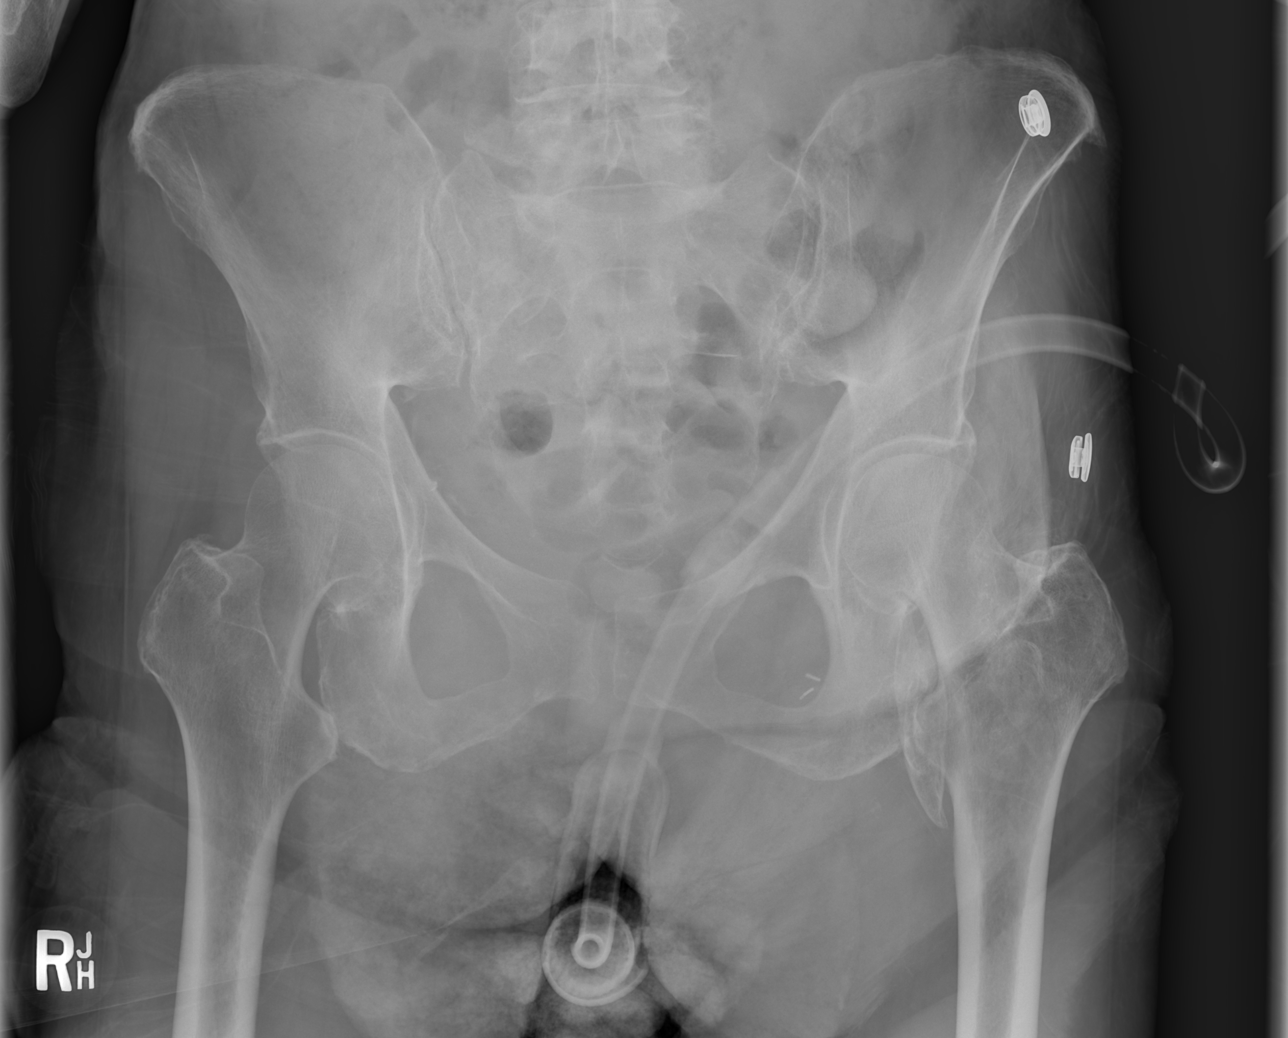

[x hip ap left]
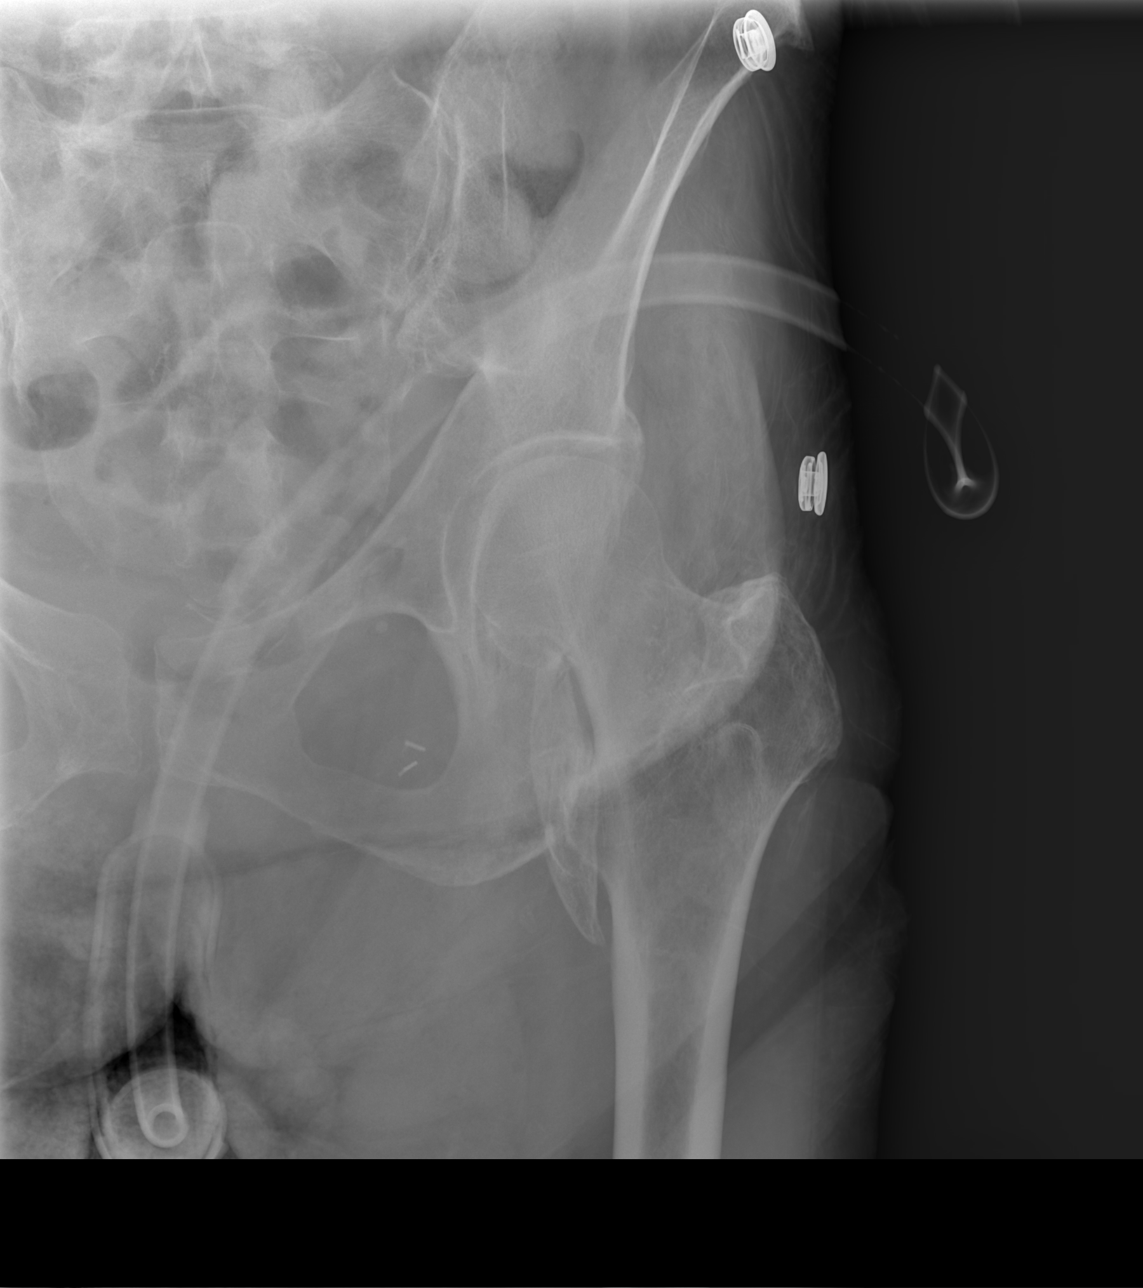

[x hip ap right]
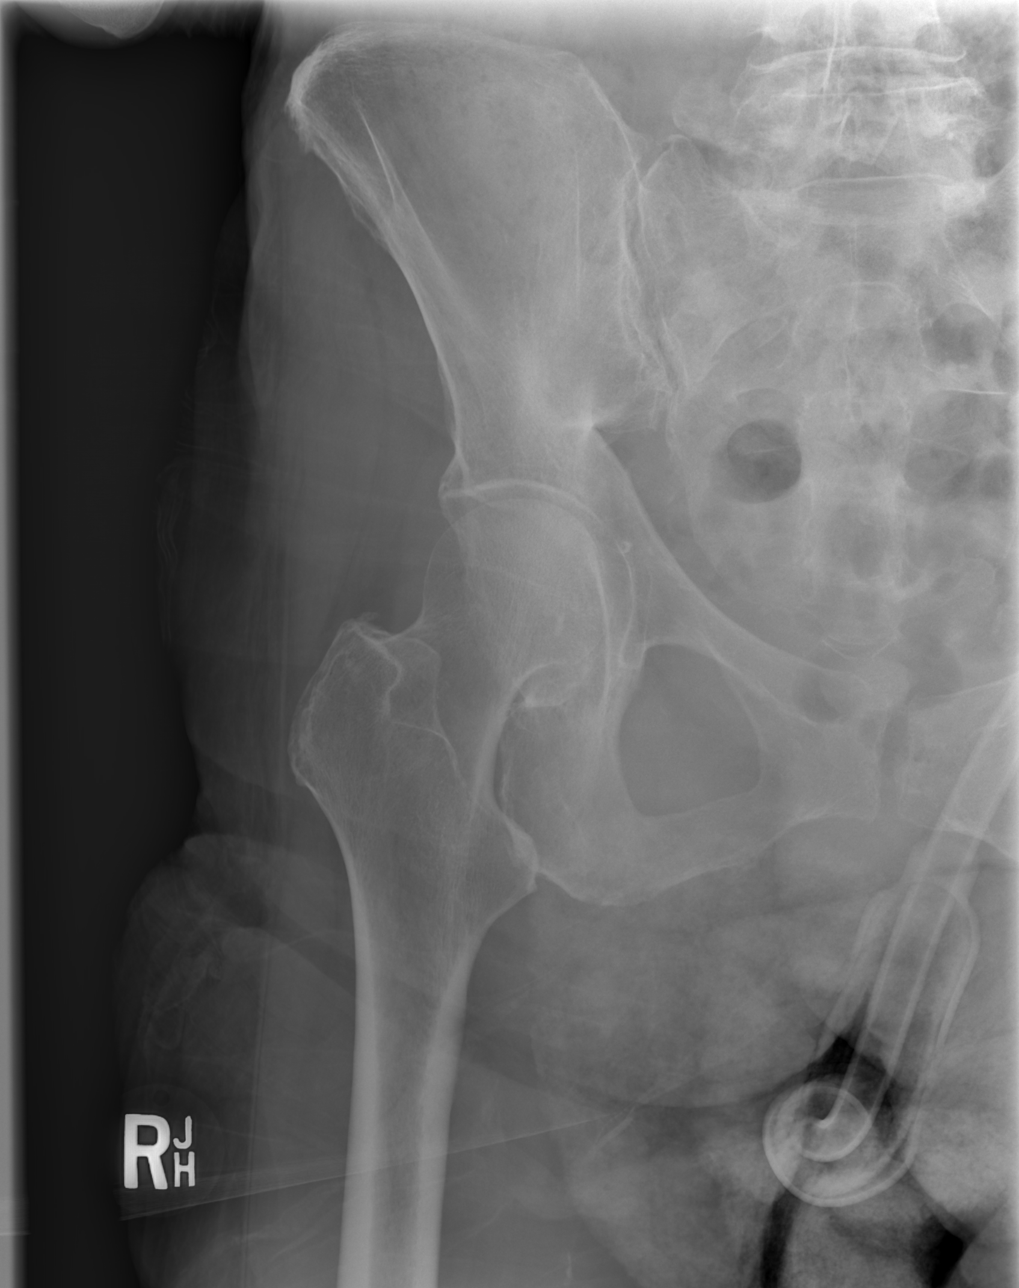

[w hip lat left]
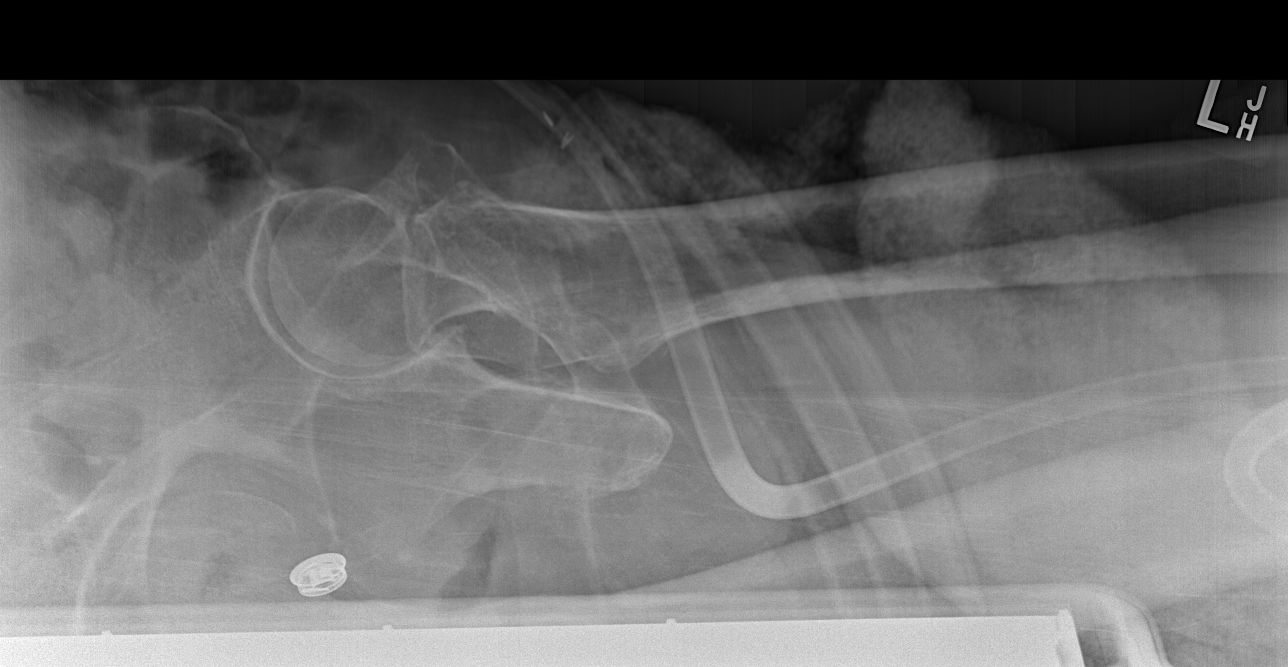

[w hip lat right]
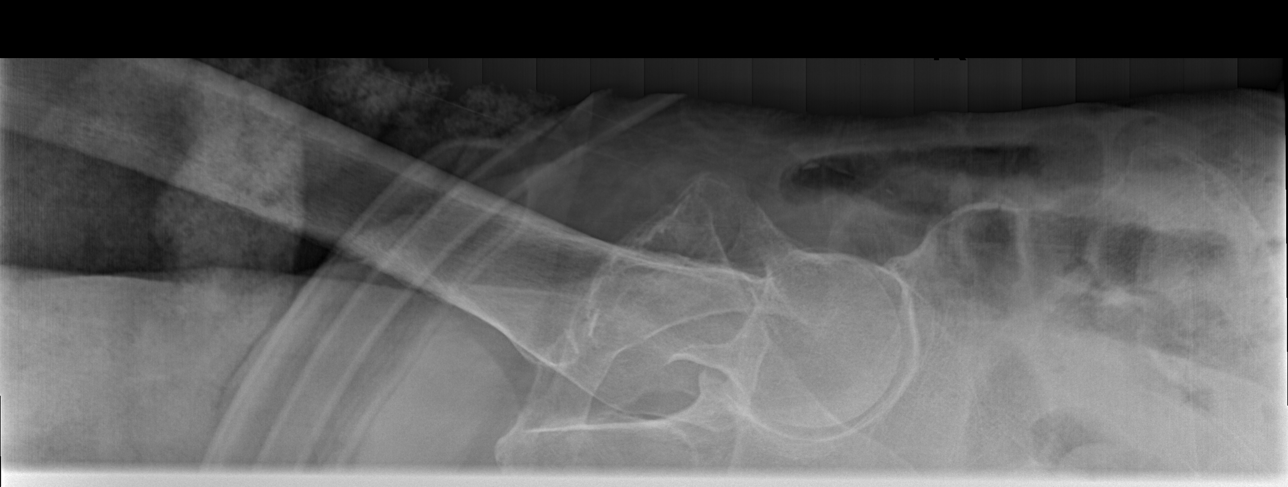

[5 of 5 positions shown; findings below may reference images not displayed]

FINDINGS: New, elongated 6-7 centimeter confluent soft tissue calcification
along the medial proximal left femur is felt to be myositis
ossificans related to the injury of the iliopsoas muscle insertion
demonstrated by MRI in [REDACTED].

The femoral heads remain normally located. Hip joint spaces appear
stable. No superimposed proximal left femur fracture identified.

Elsewhere the pelvis appears stable and intact. Chronic left
inguinal surgical clips. Proximal right femur appears intact.
Negative visible bowel gas pattern. SI joints remain normal.
IMPRESSION: 1. Suspected myositis ossificans medial to the proximal left femur,
sequelae of the distal iliopsoas muscle injury demonstrated by MRI
in [DATE]. No superimposed acute fracture or dislocation identified about
the bilateral hips or pelvis.
If occult hip fracture is suspected or if the patient is unable to
weightbear, MRI is the preferred modality for further evaluation.
